# Patient Record
Sex: Female | Born: 1975 | Hispanic: No | Marital: Single | Smoking: Former smoker
Health system: Southern US, Community
[De-identification: ages and names within clinical notes are randomized; demographics above are authoritative.]

## PROBLEM LIST (undated history)

## (undated) DIAGNOSIS — E8809 Other disorders of plasma-protein metabolism, not elsewhere classified: Secondary | ICD-10-CM

## (undated) DIAGNOSIS — C7951 Secondary malignant neoplasm of bone: Secondary | ICD-10-CM

## (undated) DIAGNOSIS — F419 Anxiety disorder, unspecified: Secondary | ICD-10-CM

## (undated) DIAGNOSIS — Z87442 Personal history of urinary calculi: Secondary | ICD-10-CM

## (undated) DIAGNOSIS — D649 Anemia, unspecified: Secondary | ICD-10-CM

## (undated) DIAGNOSIS — Z8489 Family history of other specified conditions: Secondary | ICD-10-CM

## (undated) DIAGNOSIS — T8859XA Other complications of anesthesia, initial encounter: Secondary | ICD-10-CM

## (undated) DIAGNOSIS — Z8719 Personal history of other diseases of the digestive system: Secondary | ICD-10-CM

## (undated) HISTORY — PX: WISDOM TOOTH EXTRACTION: SHX21

---

## 2019-09-22 DIAGNOSIS — C801 Malignant (primary) neoplasm, unspecified: Secondary | ICD-10-CM

## 2019-09-22 DIAGNOSIS — C50919 Malignant neoplasm of unspecified site of unspecified female breast: Secondary | ICD-10-CM

## 2019-09-22 HISTORY — DX: Malignant (primary) neoplasm, unspecified: C80.1

## 2019-09-22 HISTORY — DX: Malignant neoplasm of unspecified site of unspecified female breast: C50.919

## 2020-03-05 ENCOUNTER — Other Ambulatory Visit: Payer: Self-pay | Admitting: Family Medicine

## 2020-03-05 DIAGNOSIS — N6320 Unspecified lump in the left breast, unspecified quadrant: Secondary | ICD-10-CM

## 2020-03-08 ENCOUNTER — Other Ambulatory Visit: Payer: Self-pay

## 2020-03-08 ENCOUNTER — Ambulatory Visit
Admission: RE | Admit: 2020-03-08 | Discharge: 2020-03-08 | Disposition: A | Discharge status: Home / Self Care | Payer: Self-pay | Source: Ambulatory Visit | Attending: Family Medicine | Admitting: Family Medicine

## 2020-03-08 ENCOUNTER — Other Ambulatory Visit: Payer: Self-pay | Admitting: Family Medicine

## 2020-03-08 ENCOUNTER — Ambulatory Visit
Admission: RE | Admit: 2020-03-08 | Discharge: 2020-03-08 | Disposition: A | Discharge status: Home / Self Care | Payer: Managed Care, Other (non HMO) | Source: Ambulatory Visit | Attending: Family Medicine | Admitting: Family Medicine

## 2020-03-08 DIAGNOSIS — N6320 Unspecified lump in the left breast, unspecified quadrant: Secondary | ICD-10-CM

## 2020-03-08 DIAGNOSIS — N6489 Other specified disorders of breast: Secondary | ICD-10-CM

## 2020-03-08 IMAGING — MG DIGITAL DIAGNOSTIC BILAT W/ TOMO W/ CAD
8 of 15 series · 8 of 40 positions shown · non-contrast
Comparison: Previous exam(s).

CLINICAL DATA: Patient presents for evaluation of palpable
abnormality within the left breast.

EXAM:
DIGITAL DIAGNOSTIC BILATERAL MAMMOGRAM WITH CAD AND TOMO
ULTRASOUND BILATERAL BREAST

[R CC synth-2D (1 of 2)]
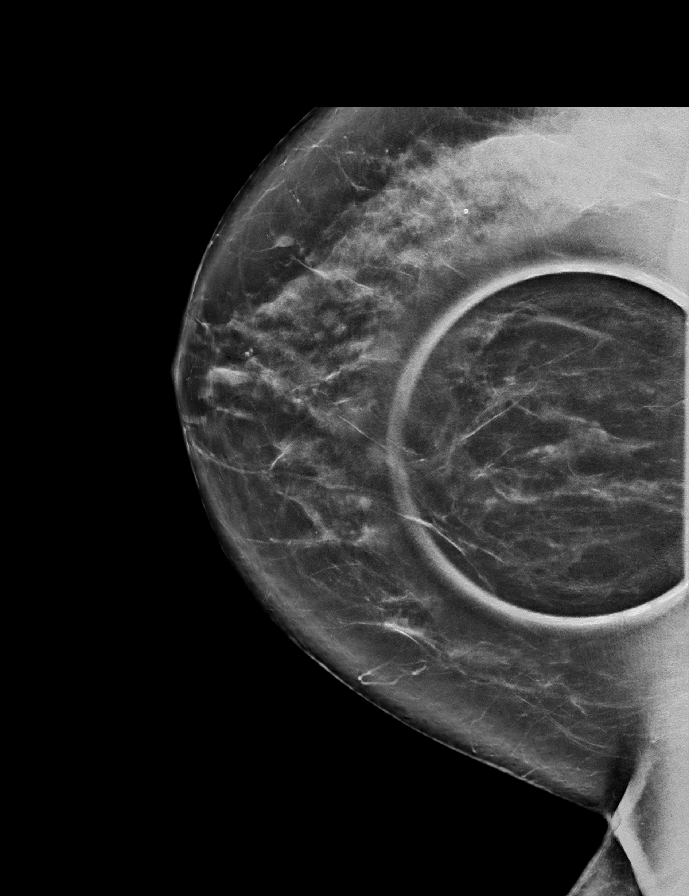

[R ML synth-2D]
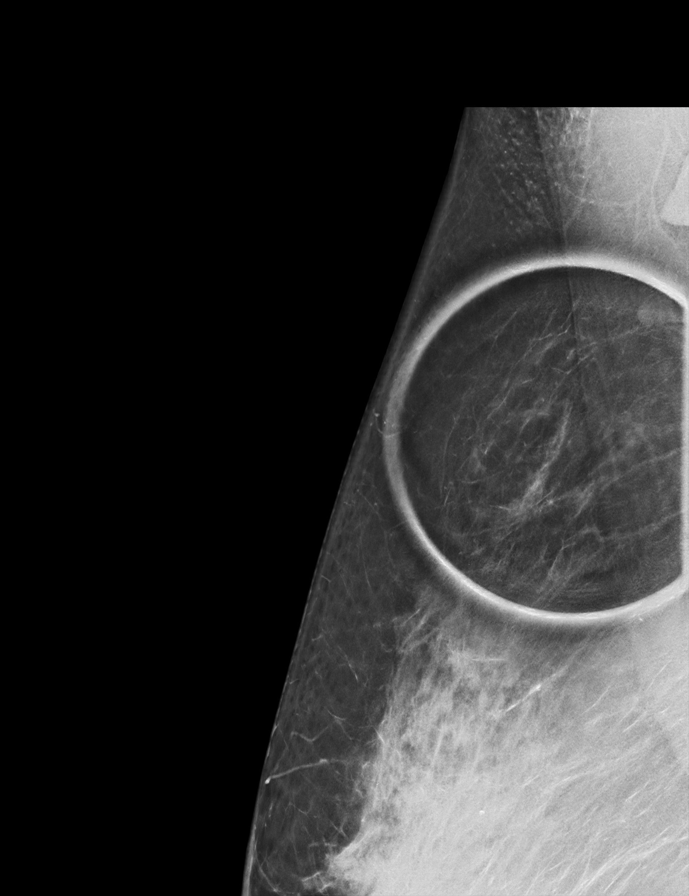

[R MLO synth-2D]
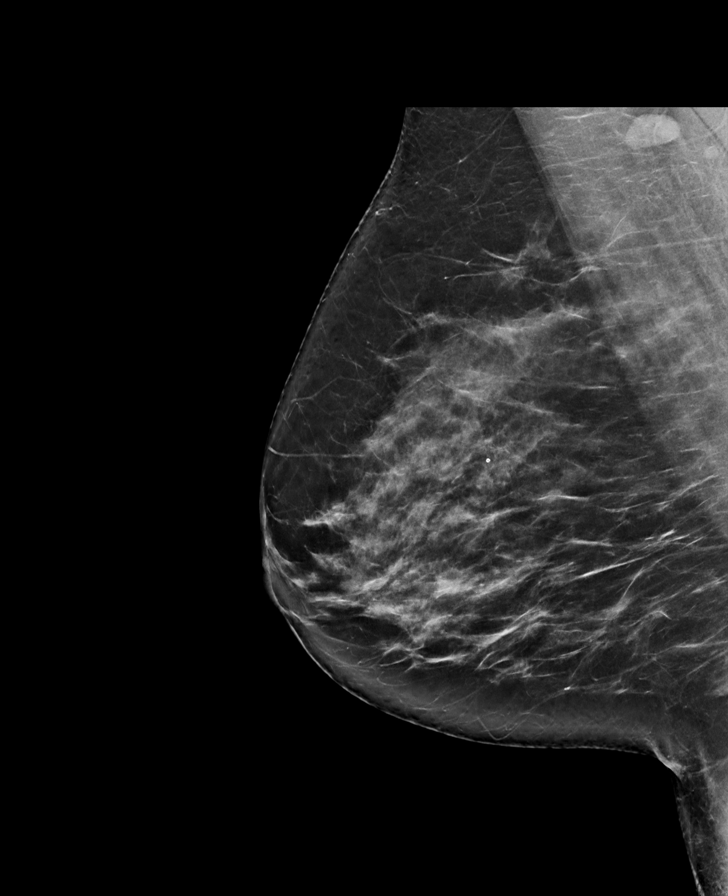

[R CC synth-2D (2 of 2)]
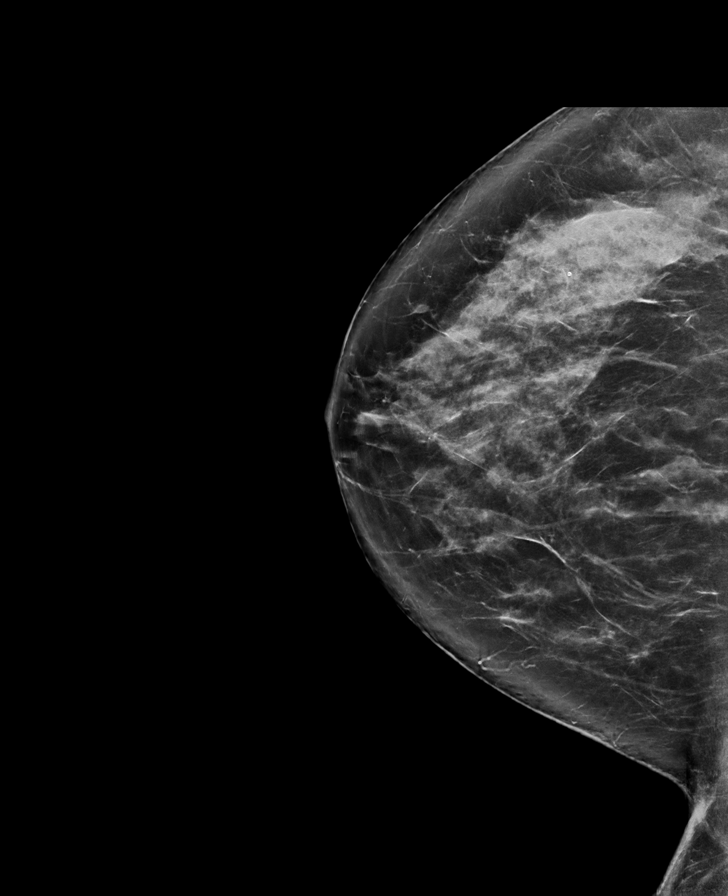

[L TAN synth-2D]
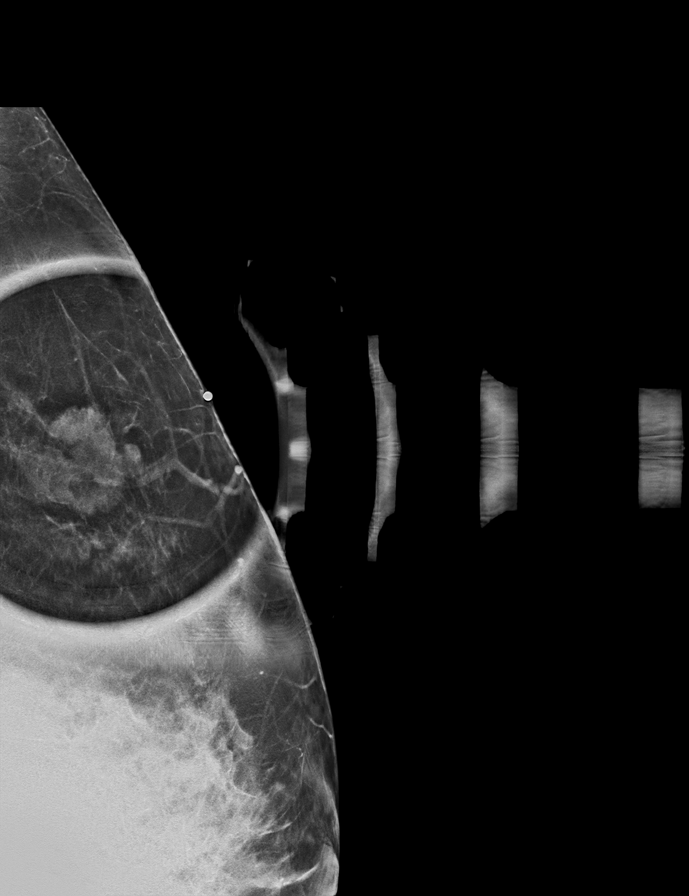

[L CC synth-2D]
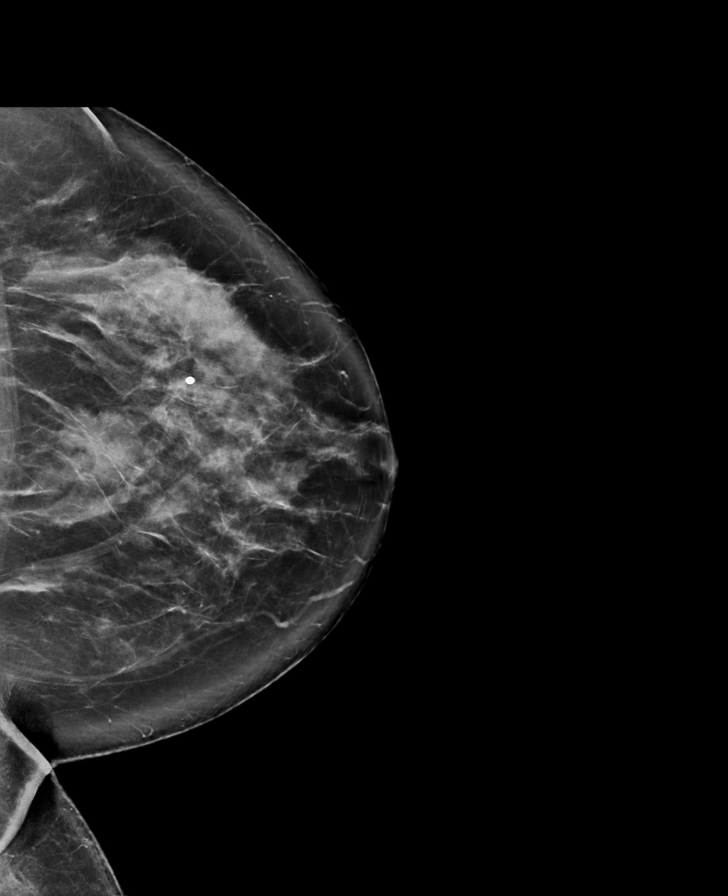

[L MLO synth-2D]
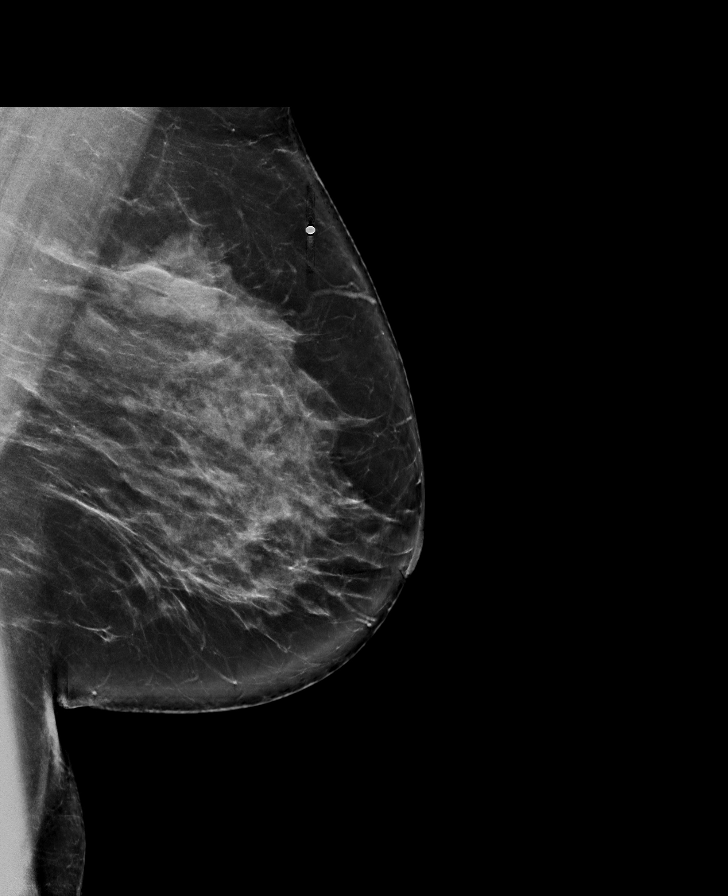

[R CC tomo · tomo slice 59/86.0]
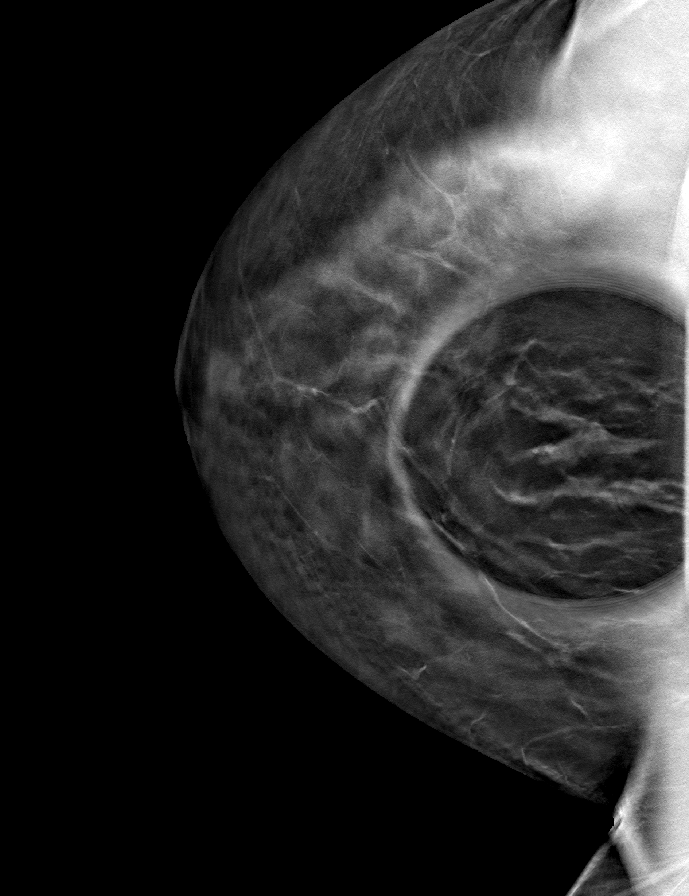

[8 of 40 positions shown; findings below may reference images not displayed]

ACR Breast Density Category c: The breast tissue is heterogeneously
dense, which may obscure small masses.
FINDINGS: Underlying the palpable marker within the left breast is a lobular
mass.

Within the upper inner right breast posterior depth there is dense
fibroglandular tissue, further evaluated with spot compression cc
tomosynthesis images.

No additional suspicious findings within the right or left breast.

Mammographic images were processed with CAD.

On physical exam, there is a palpable mass within the superior left
breast.

Targeted ultrasound is performed, showing a 3.0 x 1.7 x 2.6 cm
irregular hypoechoic left breast mass 11 o'clock position 10 cm from
nipple the site of palpable concern.

No left axillary adenopathy.

No suspicious abnormality within the upper inner or upper-outer
right breast.

Normal right axillary lymph nodes.
IMPRESSION: Suspicious palpable mass within the left breast 11 o'clock position.

RECOMMENDATION:
Ultrasound-guided core needle biopsy palpable suspicious left breast
mass 11 o'clock position.

I have discussed the findings and recommendations with the patient.
If applicable, a reminder letter will be sent to the patient
regarding the next appointment.

BI-RADS CATEGORY  5: Highly suggestive of malignancy.

## 2020-03-08 IMAGING — US US BREAST*L* LIMITED INC AXILLA
1 series · 13 of 13 positions shown · non-contrast
Comparison: Previous exam(s).

CLINICAL DATA: Patient presents for evaluation of palpable
abnormality within the left breast.

EXAM:
DIGITAL DIAGNOSTIC BILATERAL MAMMOGRAM WITH CAD AND TOMO
ULTRASOUND BILATERAL BREAST

[Series 1: us breast*left* limited inc axilla · 0.07mm/px · 13 of 13 slices shown]
[im 1/13]
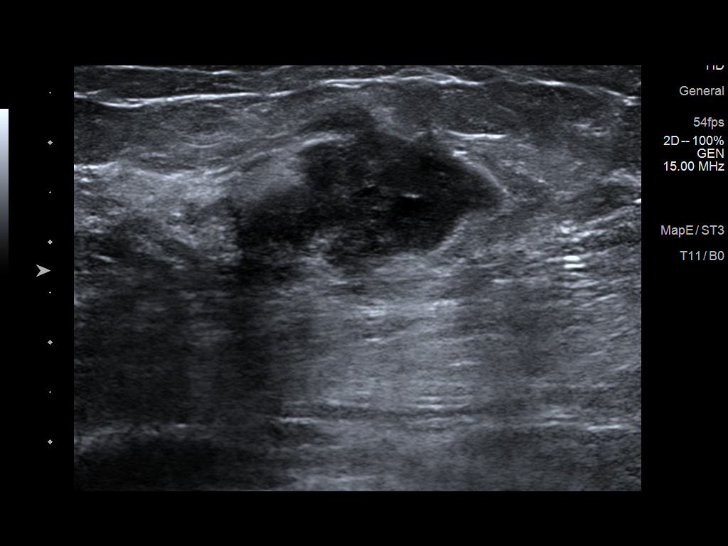
[im 2/13]
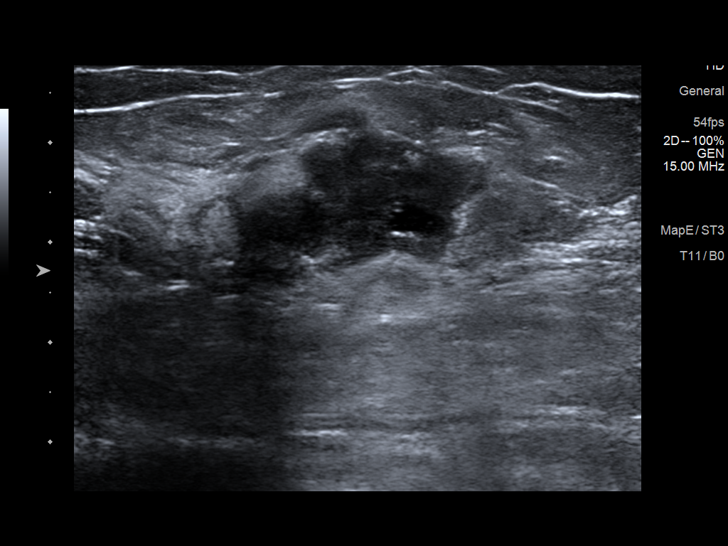
[im 3/13]
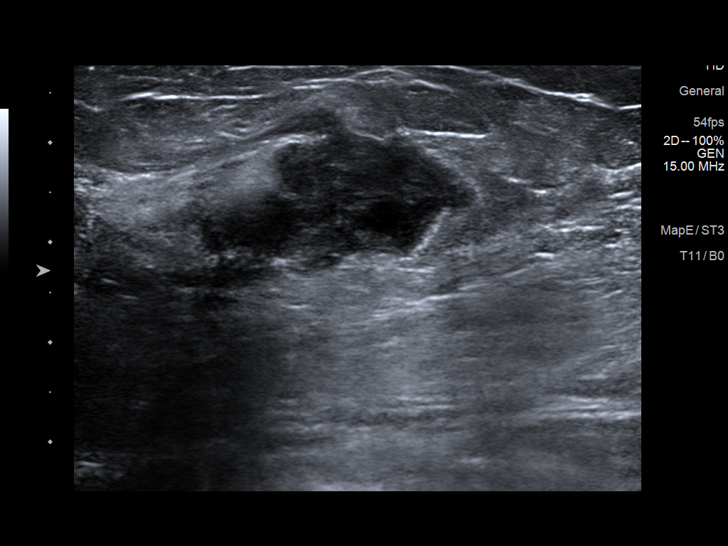
[im 4/13]
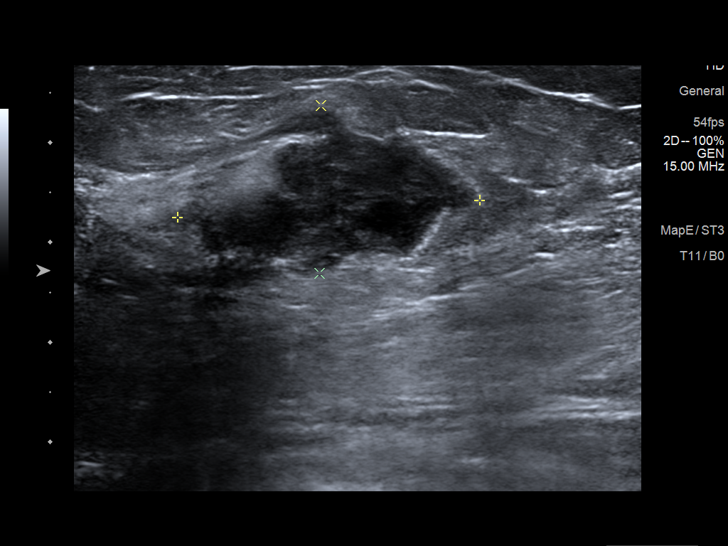
[im 5/13]
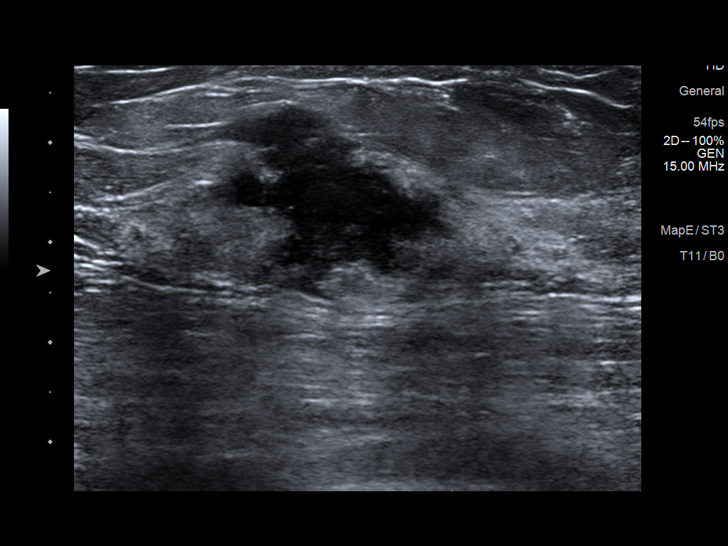
[im 6/13]
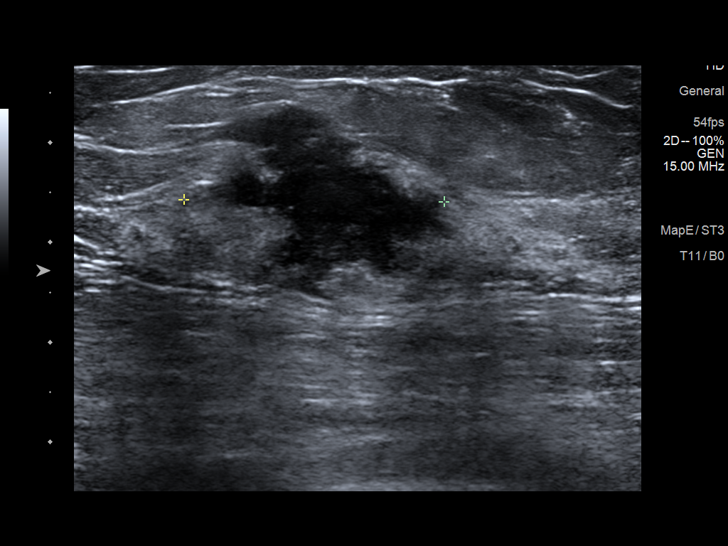
[im 7/13]
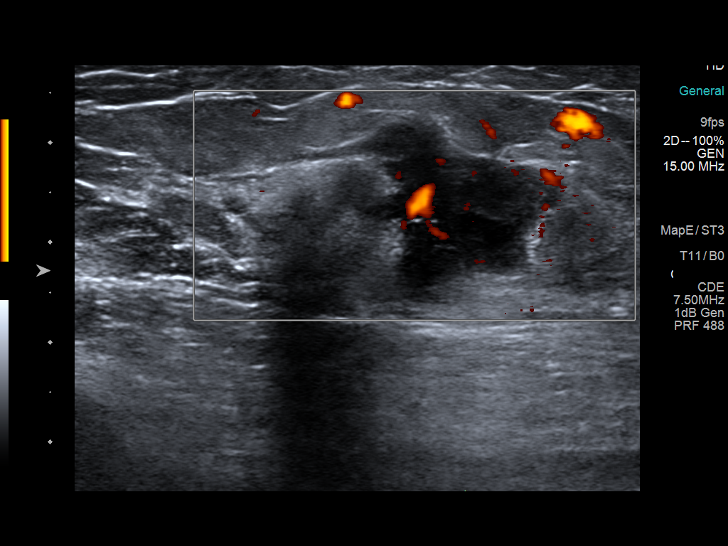
[im 8/13]
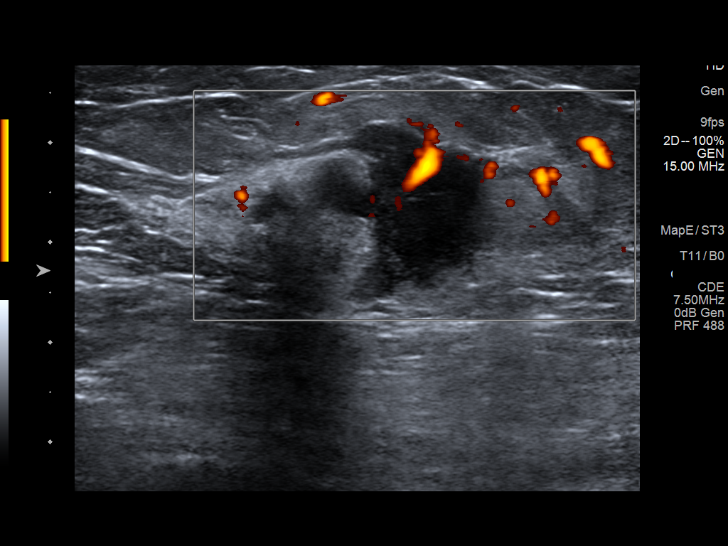
[im 9/13]
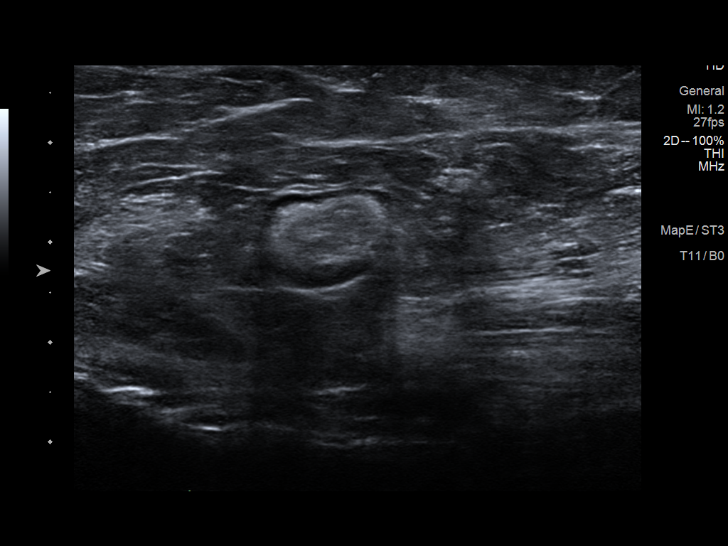
[im 10/13]
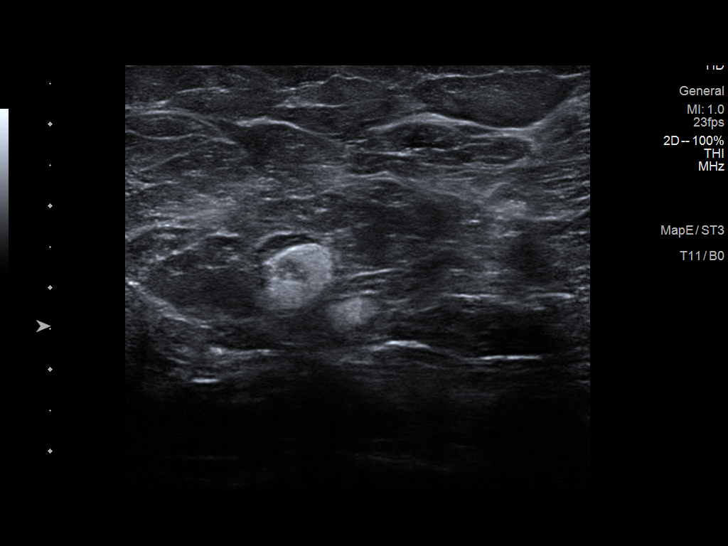
[im 11/13]
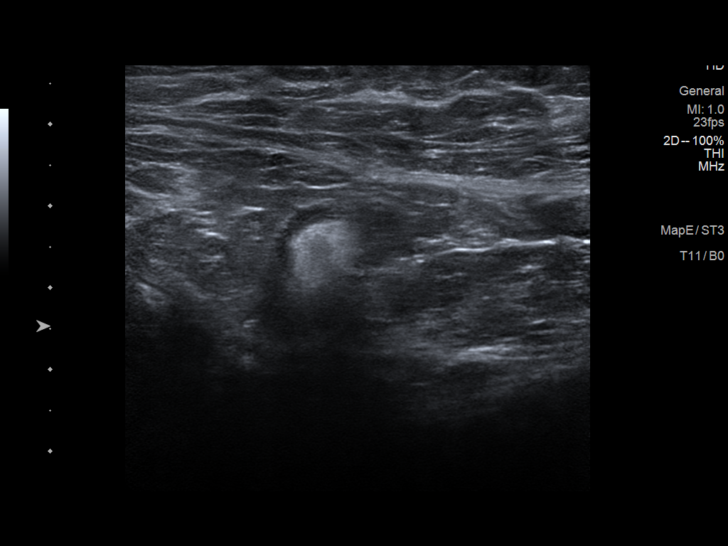
[im 12/13]
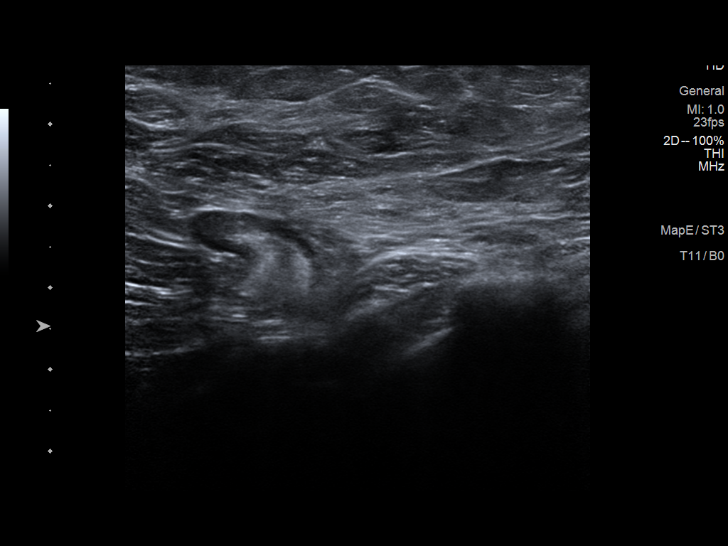
[im 13/13]
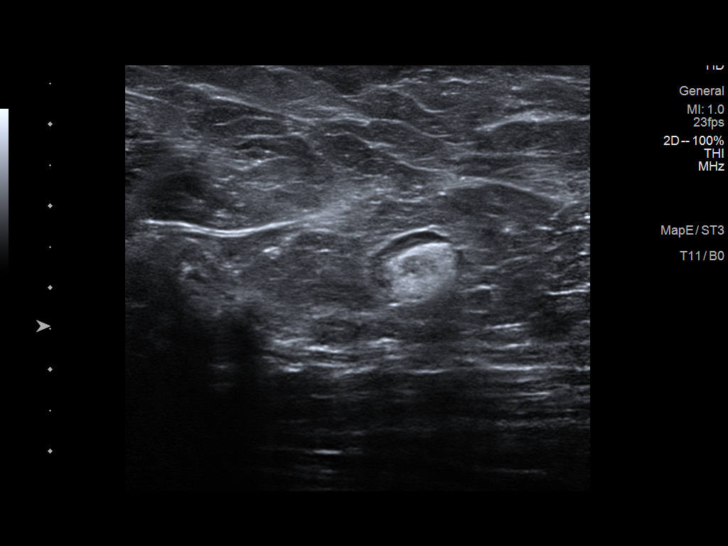

[13 of 13 positions shown; findings below may reference images not displayed]

ACR Breast Density Category c: The breast tissue is heterogeneously
dense, which may obscure small masses.
FINDINGS: Underlying the palpable marker within the left breast is a lobular
mass.

Within the upper inner right breast posterior depth there is dense
fibroglandular tissue, further evaluated with spot compression cc
tomosynthesis images.

No additional suspicious findings within the right or left breast.

Mammographic images were processed with CAD.

On physical exam, there is a palpable mass within the superior left
breast.

Targeted ultrasound is performed, showing a 3.0 x 1.7 x 2.6 cm
irregular hypoechoic left breast mass 11 o'clock position 10 cm from
nipple the site of palpable concern.

No left axillary adenopathy.

No suspicious abnormality within the upper inner or upper-outer
right breast.

Normal right axillary lymph nodes.
IMPRESSION: Suspicious palpable mass within the left breast 11 o'clock position.

RECOMMENDATION:
Ultrasound-guided core needle biopsy palpable suspicious left breast
mass 11 o'clock position.

I have discussed the findings and recommendations with the patient.
If applicable, a reminder letter will be sent to the patient
regarding the next appointment.

BI-RADS CATEGORY  5: Highly suggestive of malignancy.

## 2020-03-08 IMAGING — US US BREAST*R* LIMITED INC AXILLA
1 series · 13 of 15 positions shown · non-contrast
Comparison: Previous exam(s).

CLINICAL DATA: Patient presents for evaluation of palpable
abnormality within the left breast.

EXAM:
DIGITAL DIAGNOSTIC BILATERAL MAMMOGRAM WITH CAD AND TOMO
ULTRASOUND BILATERAL BREAST

[Series 1: us breast*right* limited inc axilla · 0.07mm/px · 13 of 15 slices shown]
[im 1/15]
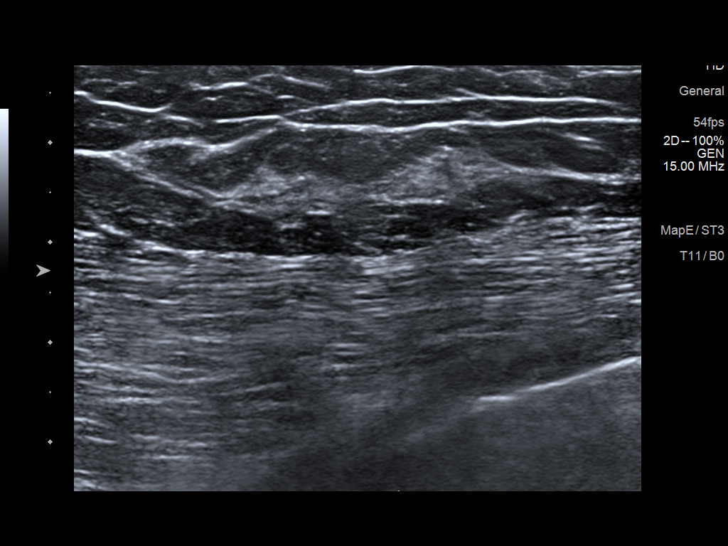
[im 2/15]
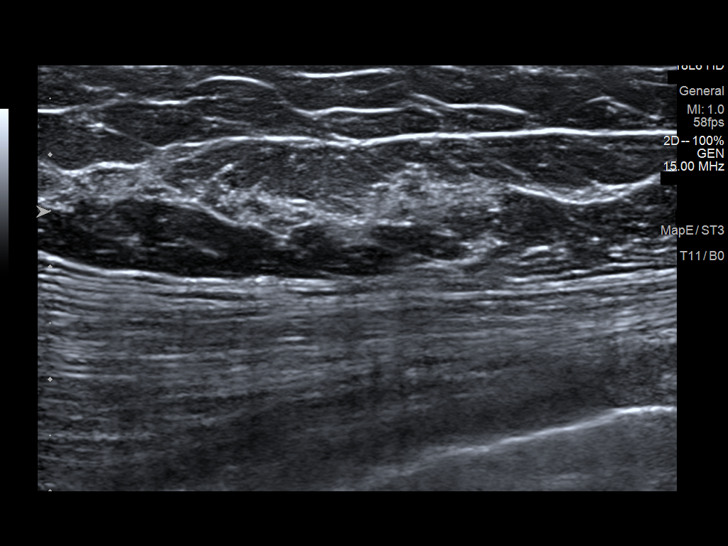
[im 3/15]
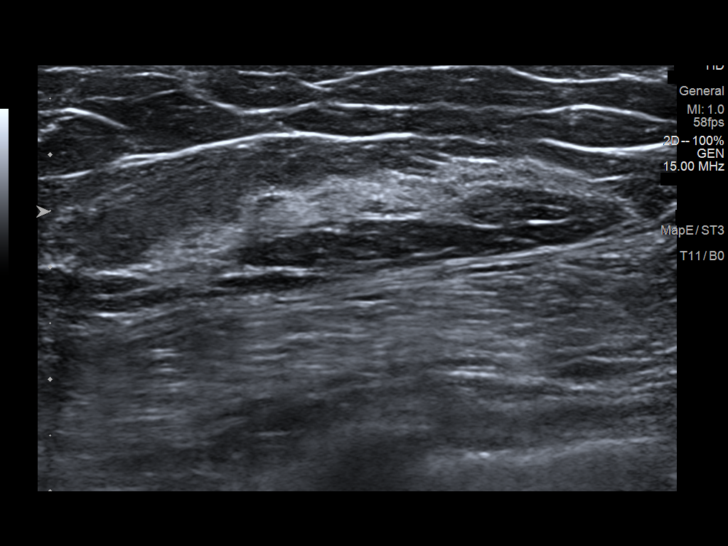
[im 5/15]
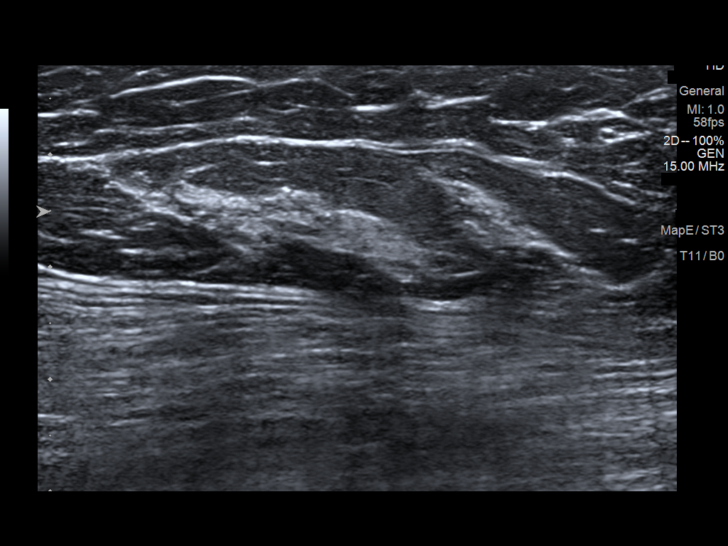
[im 6/15]
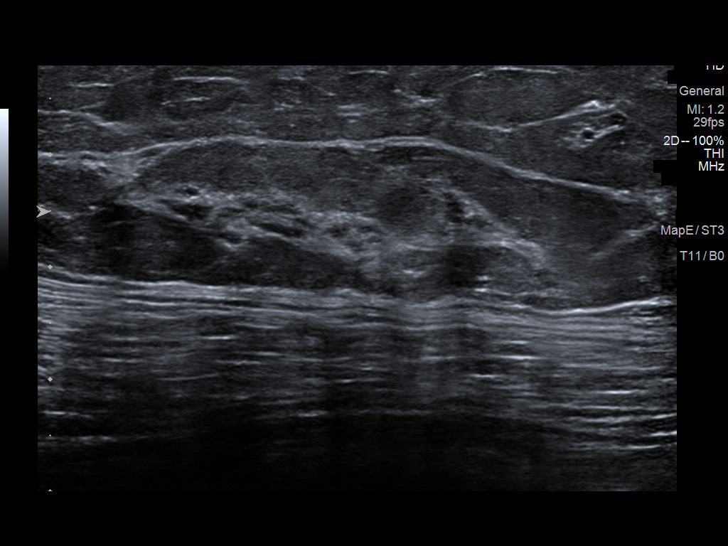
[im 7/15]
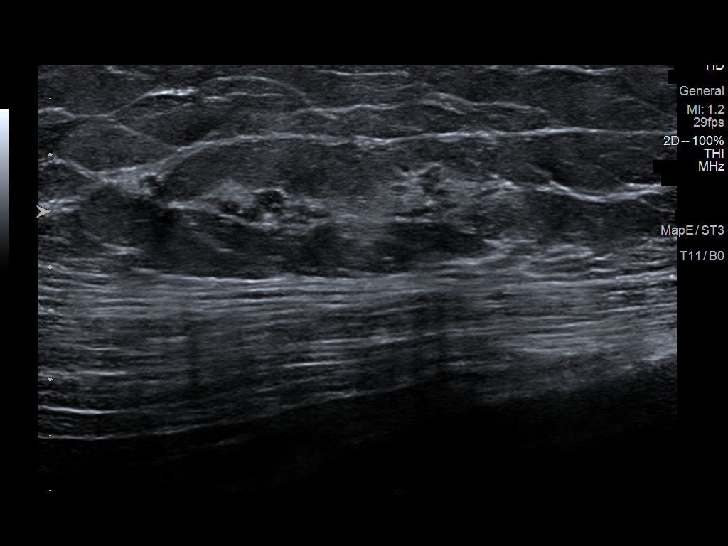
[im 8/15]
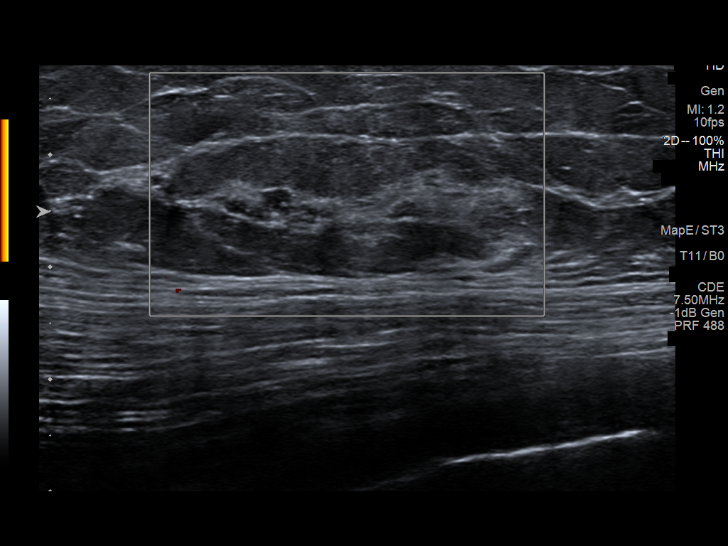
[im 9/15]
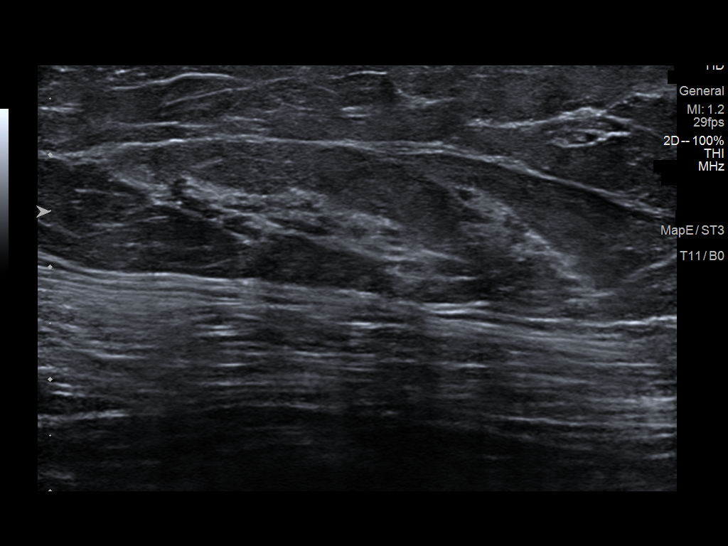
[im 10/15]
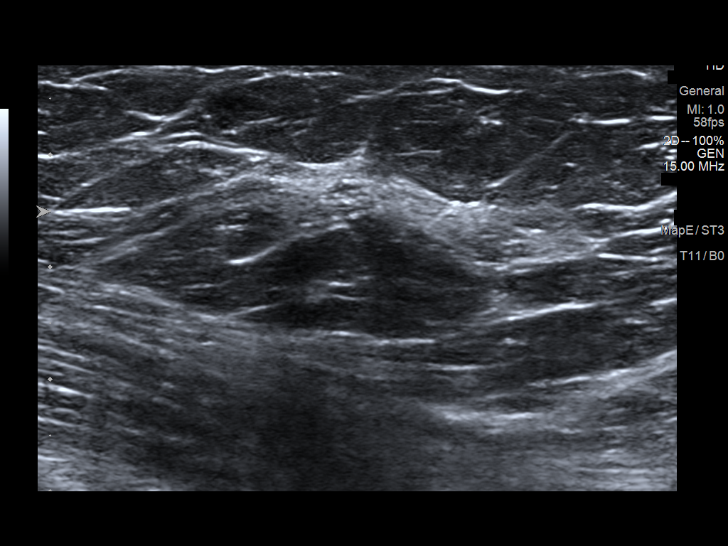
[im 11/15]
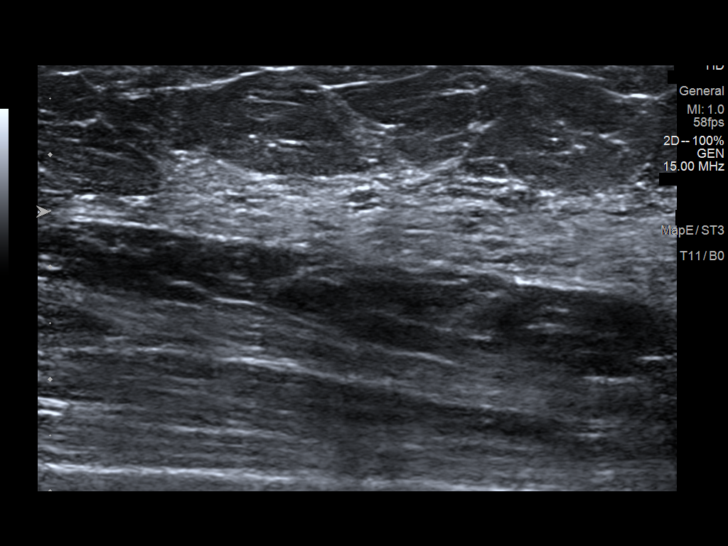
[im 13/15]
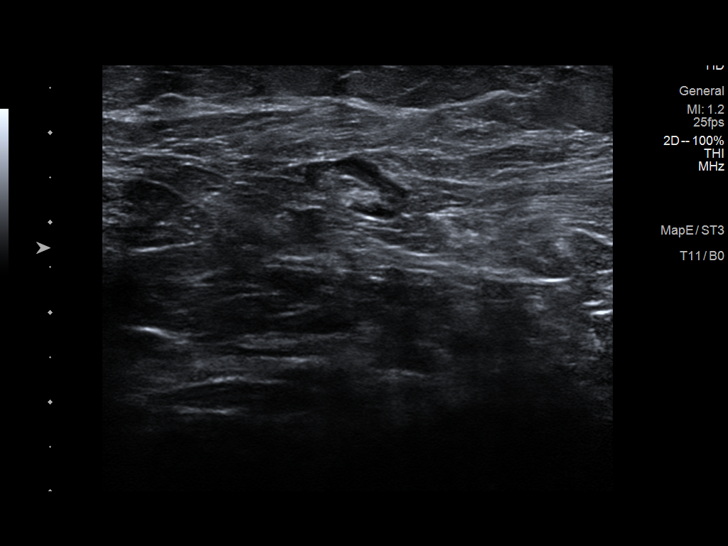
[im 14/15]
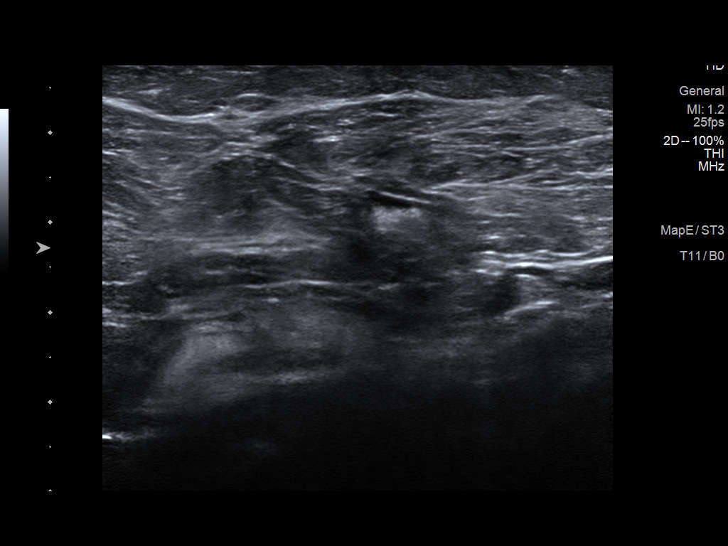
[im 15/15]
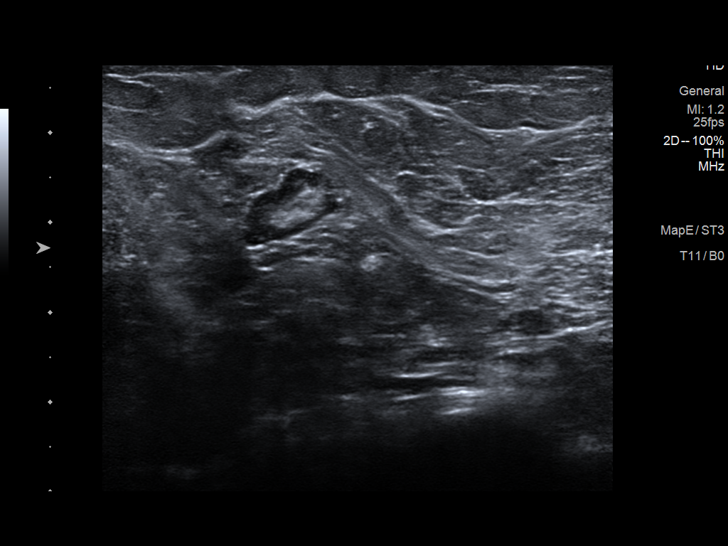

[13 of 15 positions shown; findings below may reference images not displayed]

ACR Breast Density Category c: The breast tissue is heterogeneously
dense, which may obscure small masses.
FINDINGS: Underlying the palpable marker within the left breast is a lobular
mass.

Within the upper inner right breast posterior depth there is dense
fibroglandular tissue, further evaluated with spot compression cc
tomosynthesis images.

No additional suspicious findings within the right or left breast.

Mammographic images were processed with CAD.

On physical exam, there is a palpable mass within the superior left
breast.

Targeted ultrasound is performed, showing a 3.0 x 1.7 x 2.6 cm
irregular hypoechoic left breast mass 11 o'clock position 10 cm from
nipple the site of palpable concern.

No left axillary adenopathy.

No suspicious abnormality within the upper inner or upper-outer
right breast.

Normal right axillary lymph nodes.
IMPRESSION: Suspicious palpable mass within the left breast 11 o'clock position.

RECOMMENDATION:
Ultrasound-guided core needle biopsy palpable suspicious left breast
mass 11 o'clock position.

I have discussed the findings and recommendations with the patient.
If applicable, a reminder letter will be sent to the patient
regarding the next appointment.

BI-RADS CATEGORY  5: Highly suggestive of malignancy.

## 2020-03-18 ENCOUNTER — Other Ambulatory Visit: Payer: Managed Care, Other (non HMO)

## 2020-04-09 ENCOUNTER — Other Ambulatory Visit: Payer: Managed Care, Other (non HMO)

## 2020-04-11 ENCOUNTER — Ambulatory Visit
Admission: RE | Admit: 2020-04-11 | Discharge: 2020-04-11 | Disposition: A | Discharge status: Home / Self Care | Payer: Managed Care, Other (non HMO) | Source: Ambulatory Visit | Attending: Family Medicine | Admitting: Family Medicine

## 2020-04-11 ENCOUNTER — Other Ambulatory Visit: Payer: Self-pay

## 2020-04-11 ENCOUNTER — Other Ambulatory Visit: Payer: Managed Care, Other (non HMO)

## 2020-04-11 DIAGNOSIS — N6320 Unspecified lump in the left breast, unspecified quadrant: Secondary | ICD-10-CM

## 2020-04-11 DIAGNOSIS — D0510 Intraductal carcinoma in situ of unspecified breast: Secondary | ICD-10-CM | POA: Insufficient documentation

## 2020-04-11 IMAGING — US US BREAST BX W LOC DEV 1ST LESION IMG BX SPEC US GUIDE*L*
1 series · 11 of 11 positions shown · non-contrast
Comparison: Previous exam(s).
COMPARISON: Previous exam(s).

Addendum:
CLINICAL DATA: Patient presents for ultrasound-guided core biopsy
of LEFT breast mass.

EXAM:
ULTRASOUND GUIDED LEFT BREAST CORE NEEDLE BIOPSY

[Series 1: us breast bx w loc dev 1st lesion img bx spec us g · 0.06mm/px · 11 of 11 slices shown]
[im 1/11]
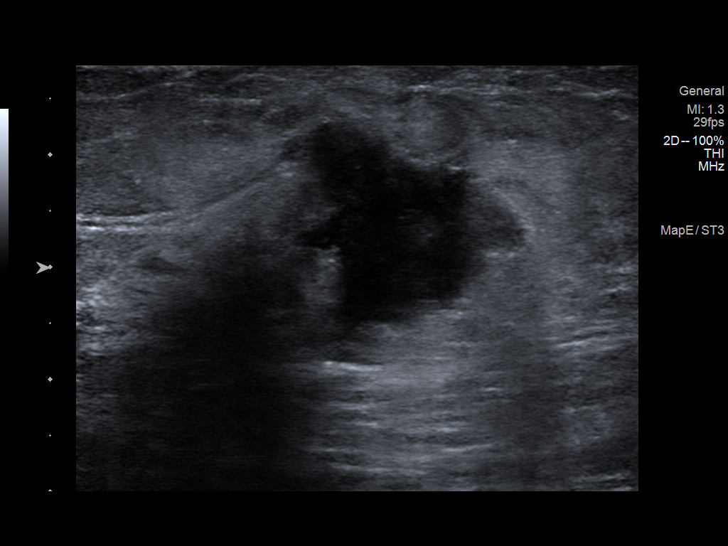
[im 2/11]
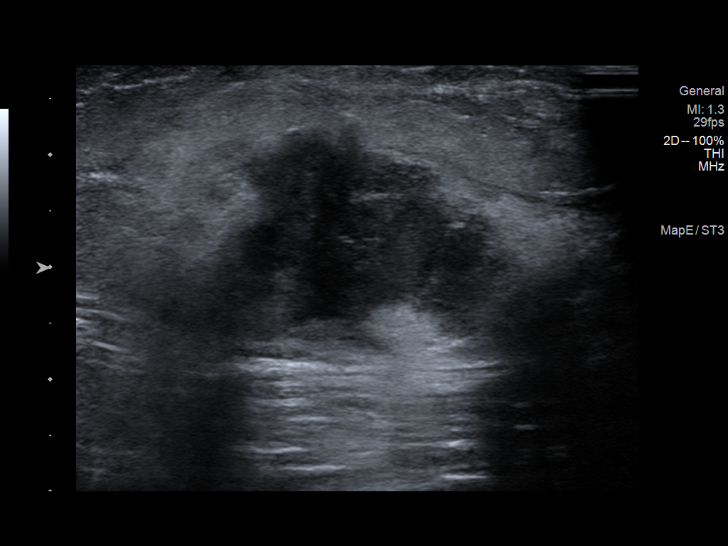
[im 3/11]
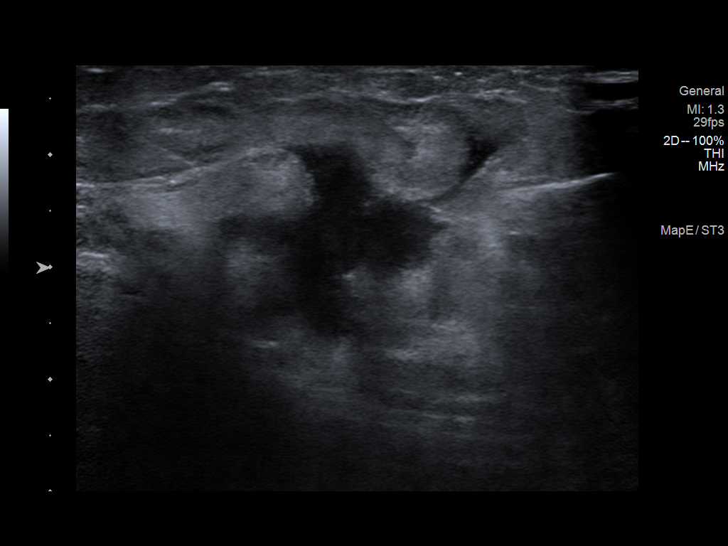
[im 4/11]
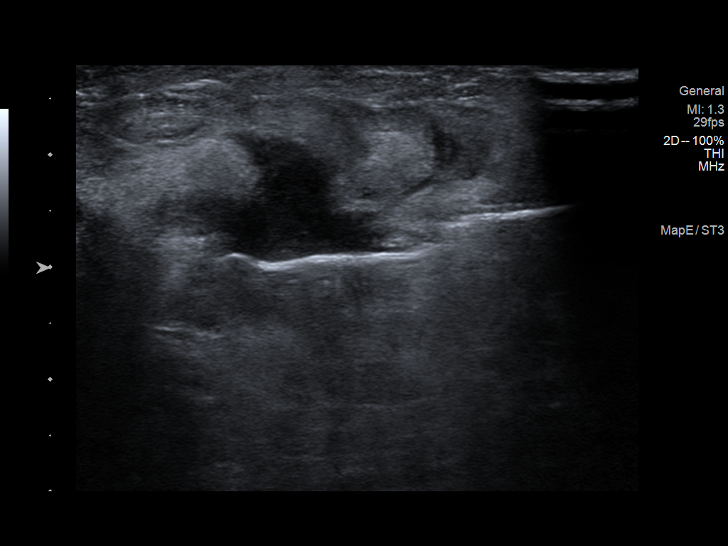
[im 5/11]
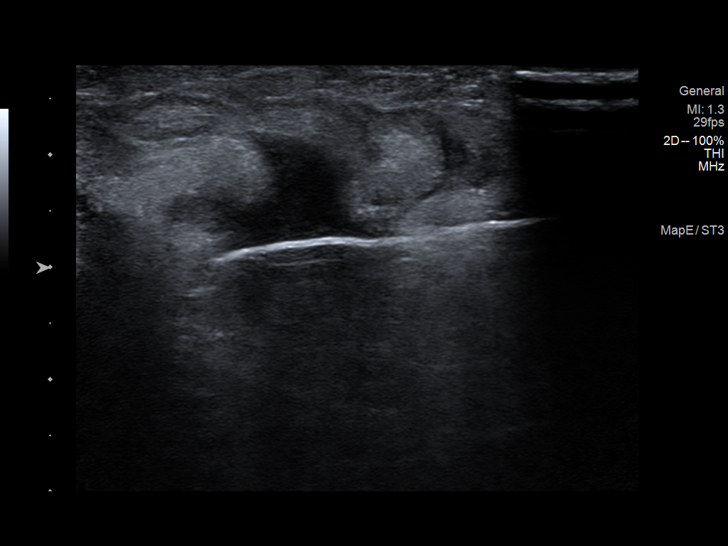
[im 6/11]
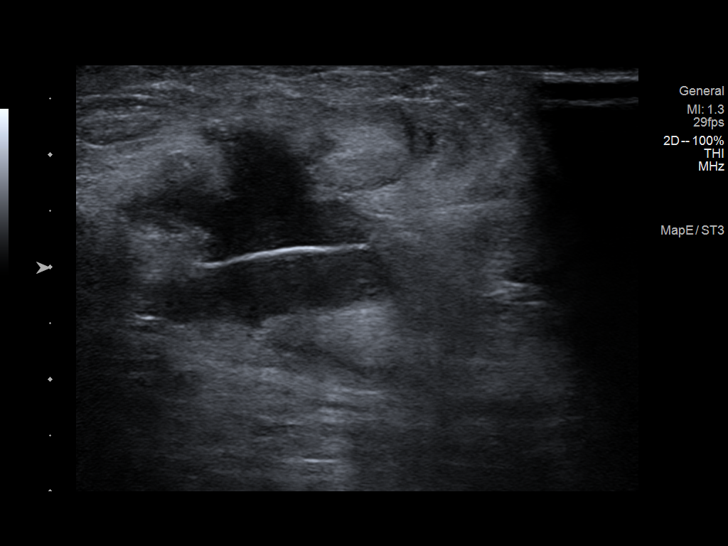
[im 7/11]
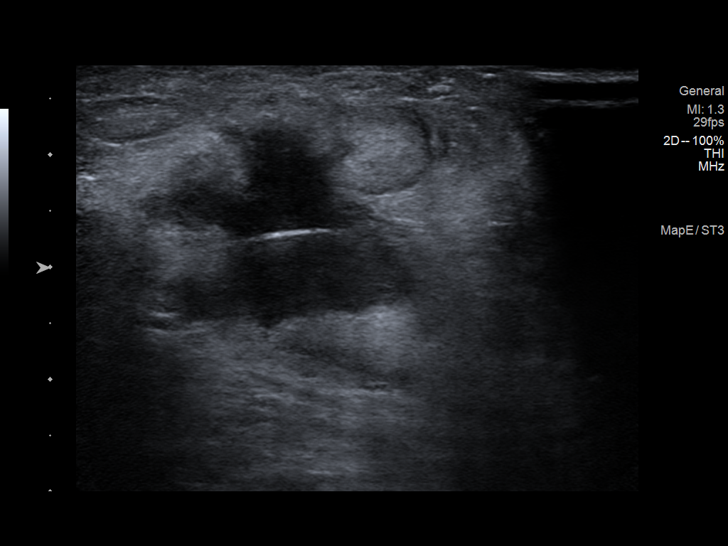
[im 8/11]
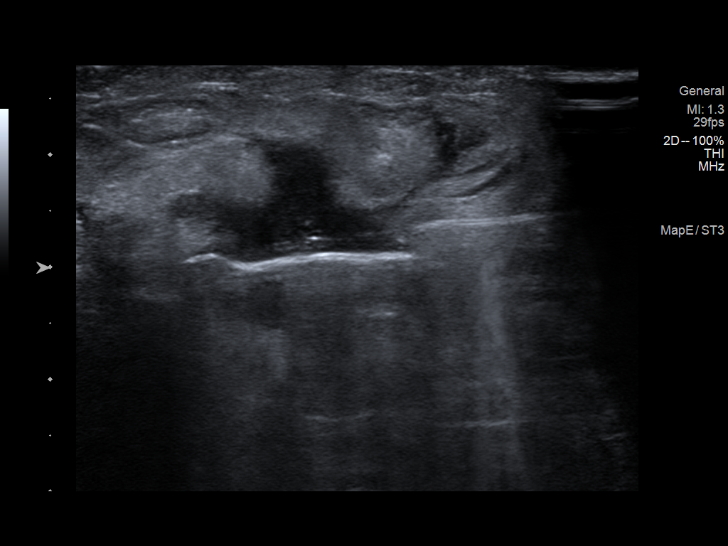
[im 9/11]
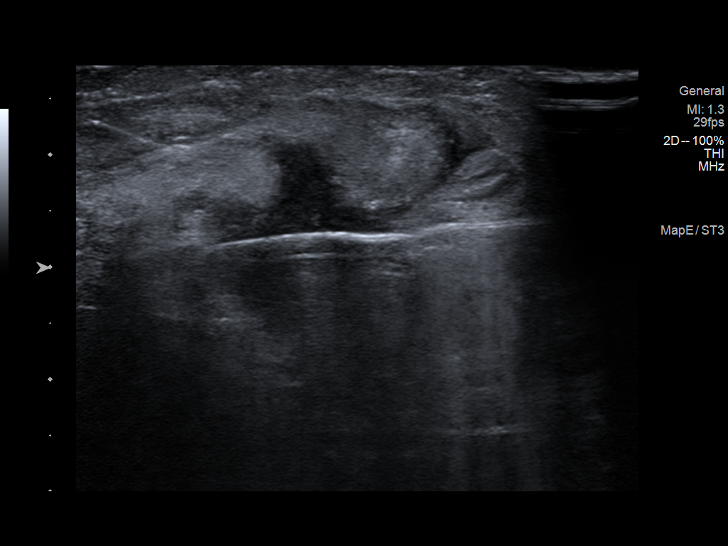
[im 10/11]
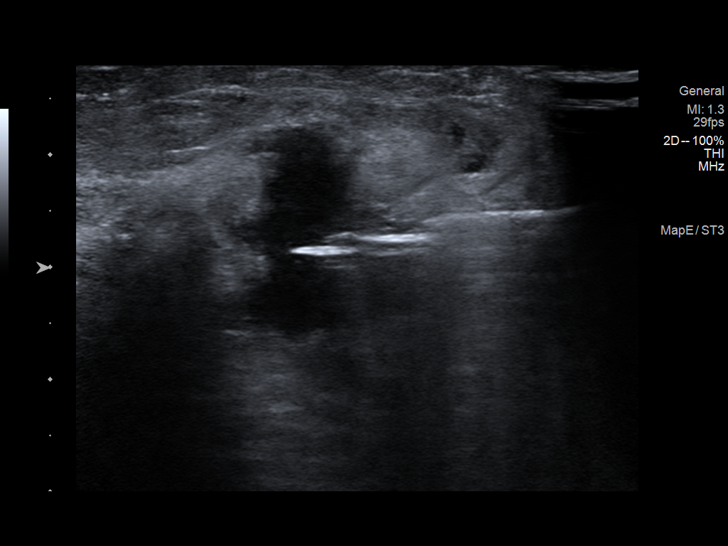
[im 11/11]
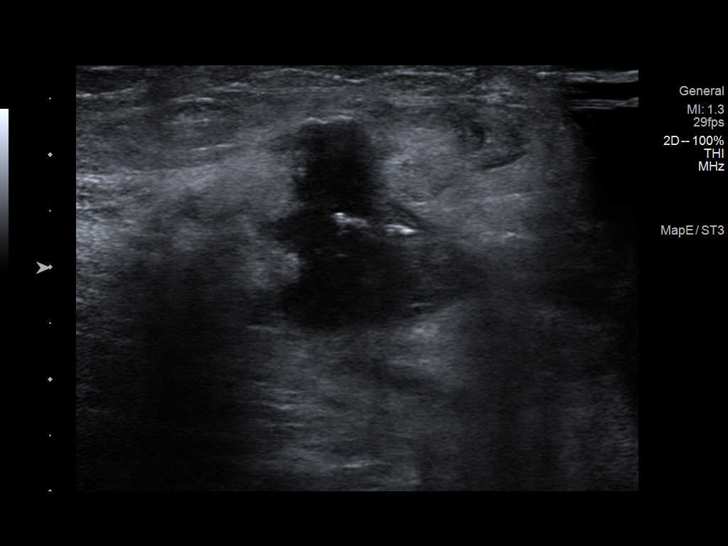

[11 of 11 positions shown; findings below may reference images not displayed]



Lesion quadrant: UPPER INNER QUADRANT LEFT breast

Using sterile technique and 1% Lidocaine as local anesthetic, under
direct ultrasound visualization, a 12 gauge NIVIRUS device was
used to perform biopsy of mass in the 11 o'clock location of the
LEFT breast using a LATERAL knee approach. At the conclusion of the
procedure ribbon shaped tissue marker clip was deployed into the
biopsy cavity. Follow up 2 view mammogram was performed and dictated
separately.
IMPRESSION: Ultrasound guided biopsy of LEFT breast mass. No apparent
complications.

ADDENDUM:
Pathology revealed GRADE II INVASIVE DUCTAL CARCINOMA, DUCTAL
CARCINOMA IN SITU of LEFT breast, 11 o'clock, [88], (ribbon clip).
This was found to be concordant by Dr. NIVIRUS.

Pathology results were discussed with the patient by telephone. The
patient reported doing well after the biopsy with tenderness and
swelling at the site. Post biopsy instructions and care were
reviewed and questions were answered. The patient was encouraged to
call The [REDACTED] for any additional
concerns. My direct phone number was provided.

Surgical consultation has been arranged with Dr. NIVIRUS at
[REDACTED] on [DATE].

Pathology results reported by NIVIRUS, RN on [DATE].



Lesion quadrant: UPPER INNER QUADRANT LEFT breast

Using sterile technique and 1% Lidocaine as local anesthetic, under
direct ultrasound visualization, a 12 gauge NIVIRUS device was
used to perform biopsy of mass in the 11 o'clock location of the
LEFT breast using a LATERAL knee approach. At the conclusion of the
procedure ribbon shaped tissue marker clip was deployed into the
biopsy cavity. Follow up 2 view mammogram was performed and dictated
separately.
IMPRESSION: Ultrasound guided biopsy of LEFT breast mass. No apparent
complications.

## 2020-04-11 IMAGING — MG MM BREAST LOCALIZATION CLIP
4 series · 4 of 12 positions shown · non-contrast
Comparison: Previous exam(s).

CLINICAL DATA: Status post ultrasound-guided core biopsy of mass in
the 11 o'clock location of the LEFT breast.

EXAM:
DIAGNOSTIC LEFT MAMMOGRAM POST ULTRASOUND BIOPSY

[L ML synth-2D]
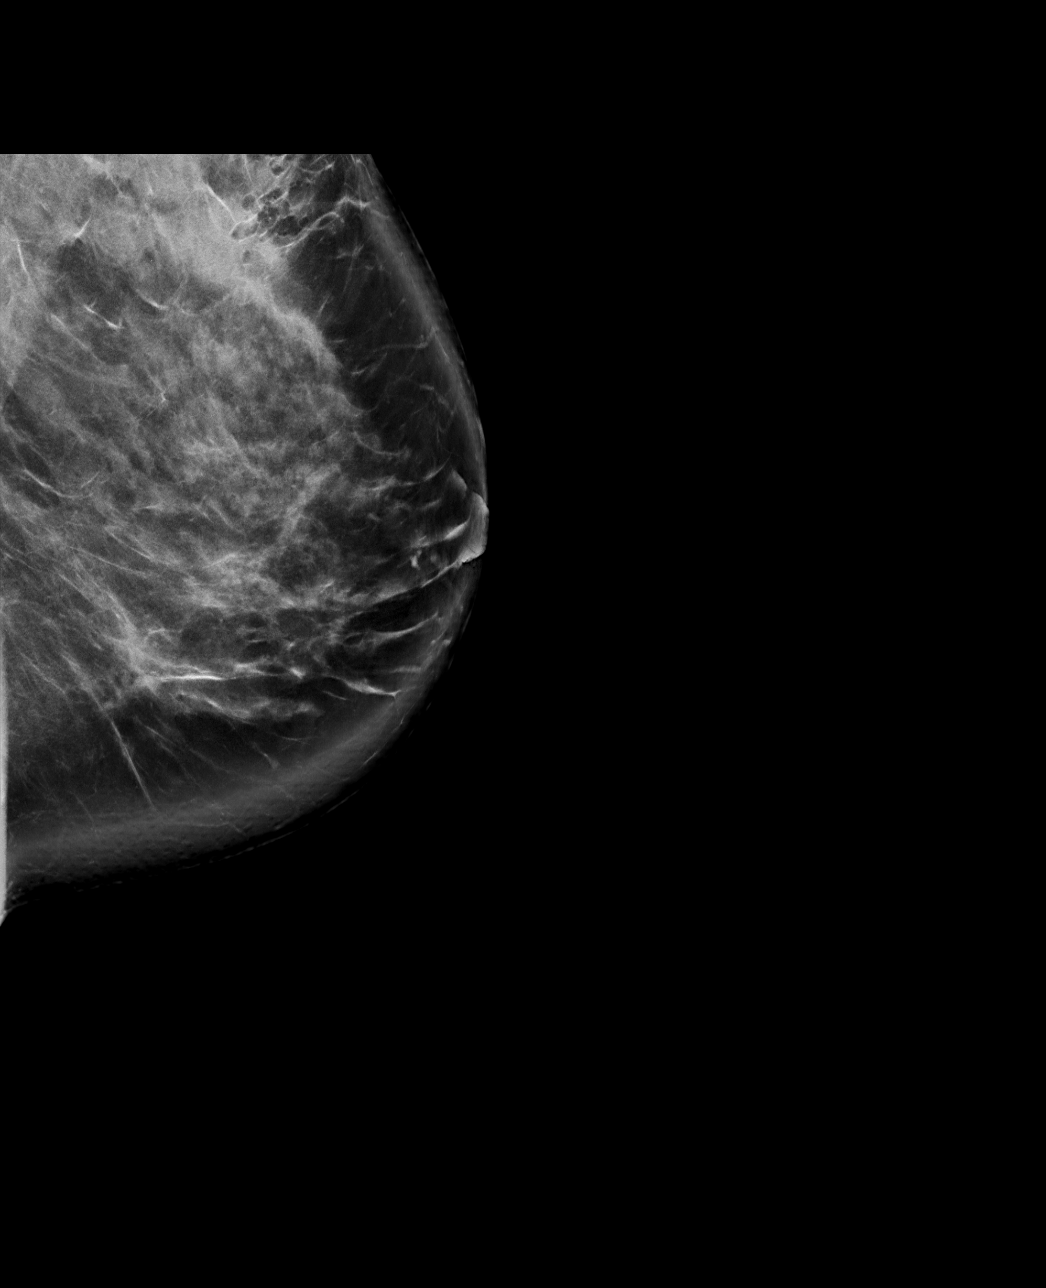

[L CC synth-2D]
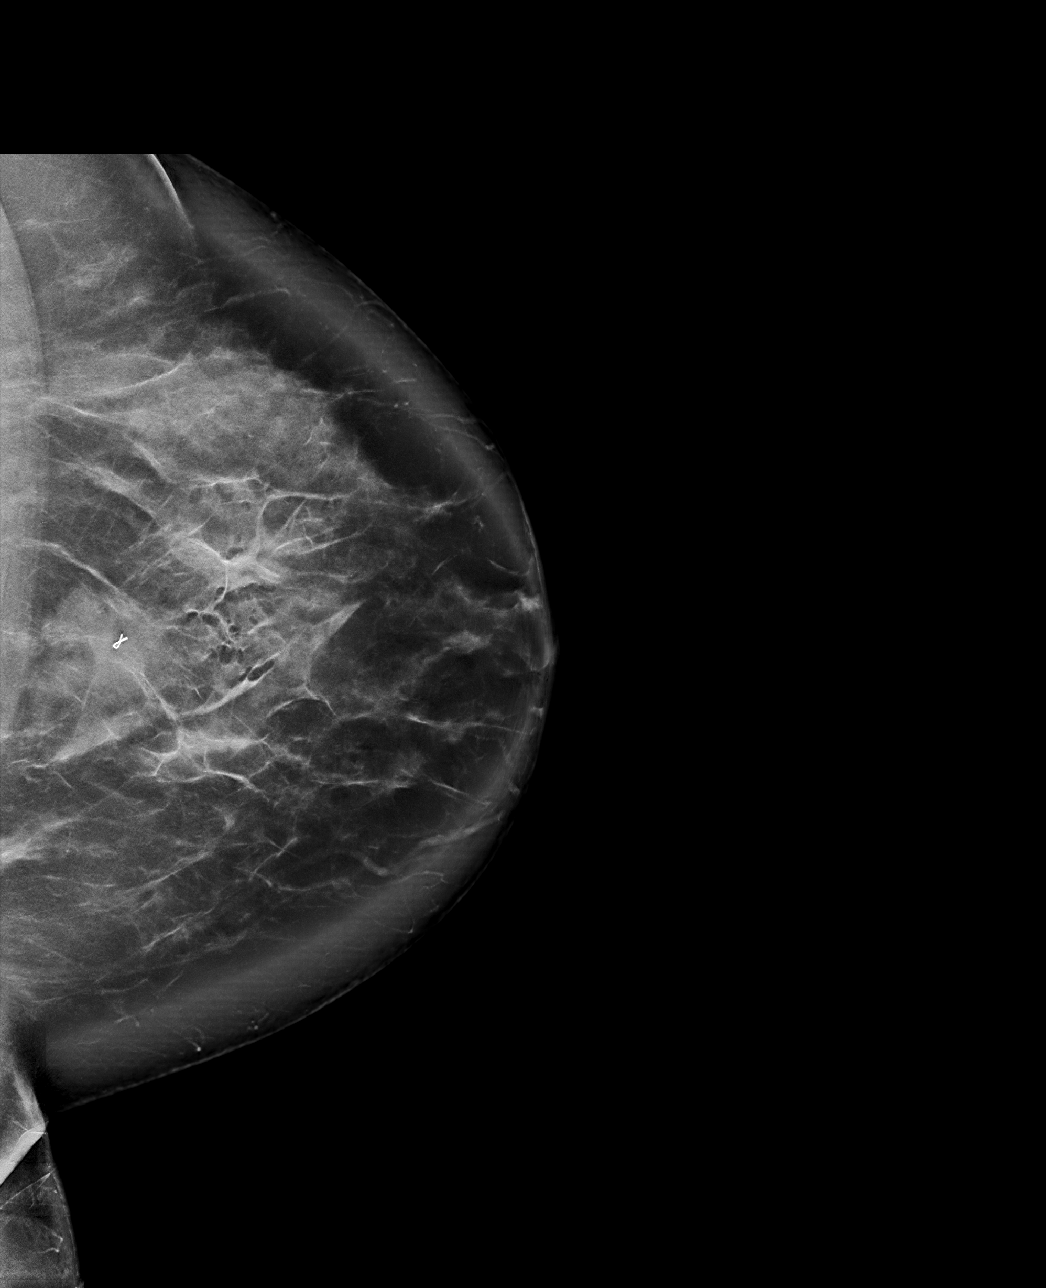

[L CC tomo · tomo slice 61/121.0]
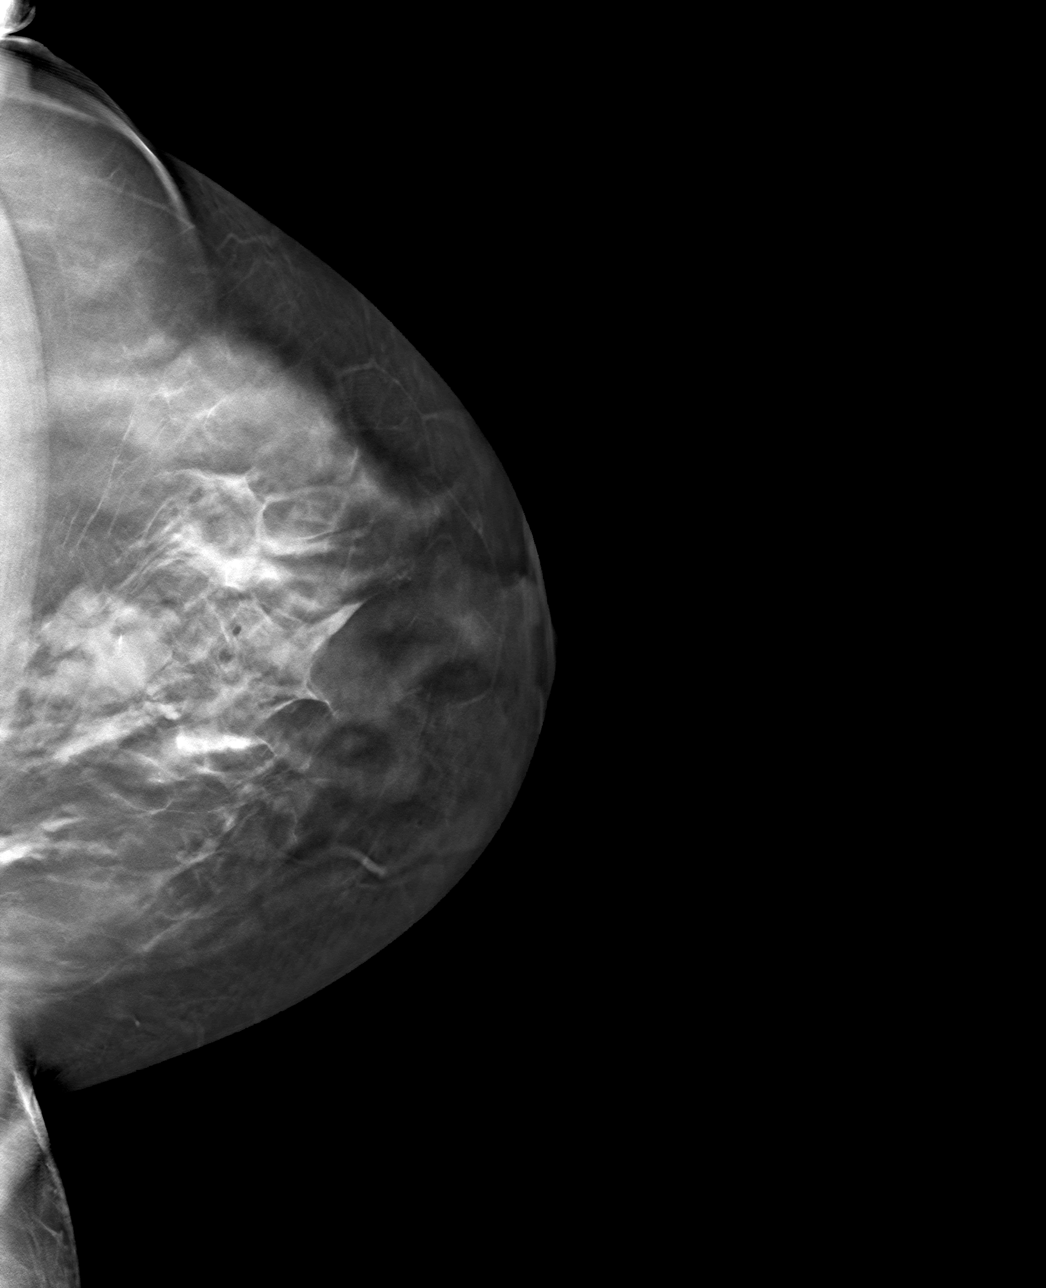

[L ML tomo · tomo slice 59/117.0]
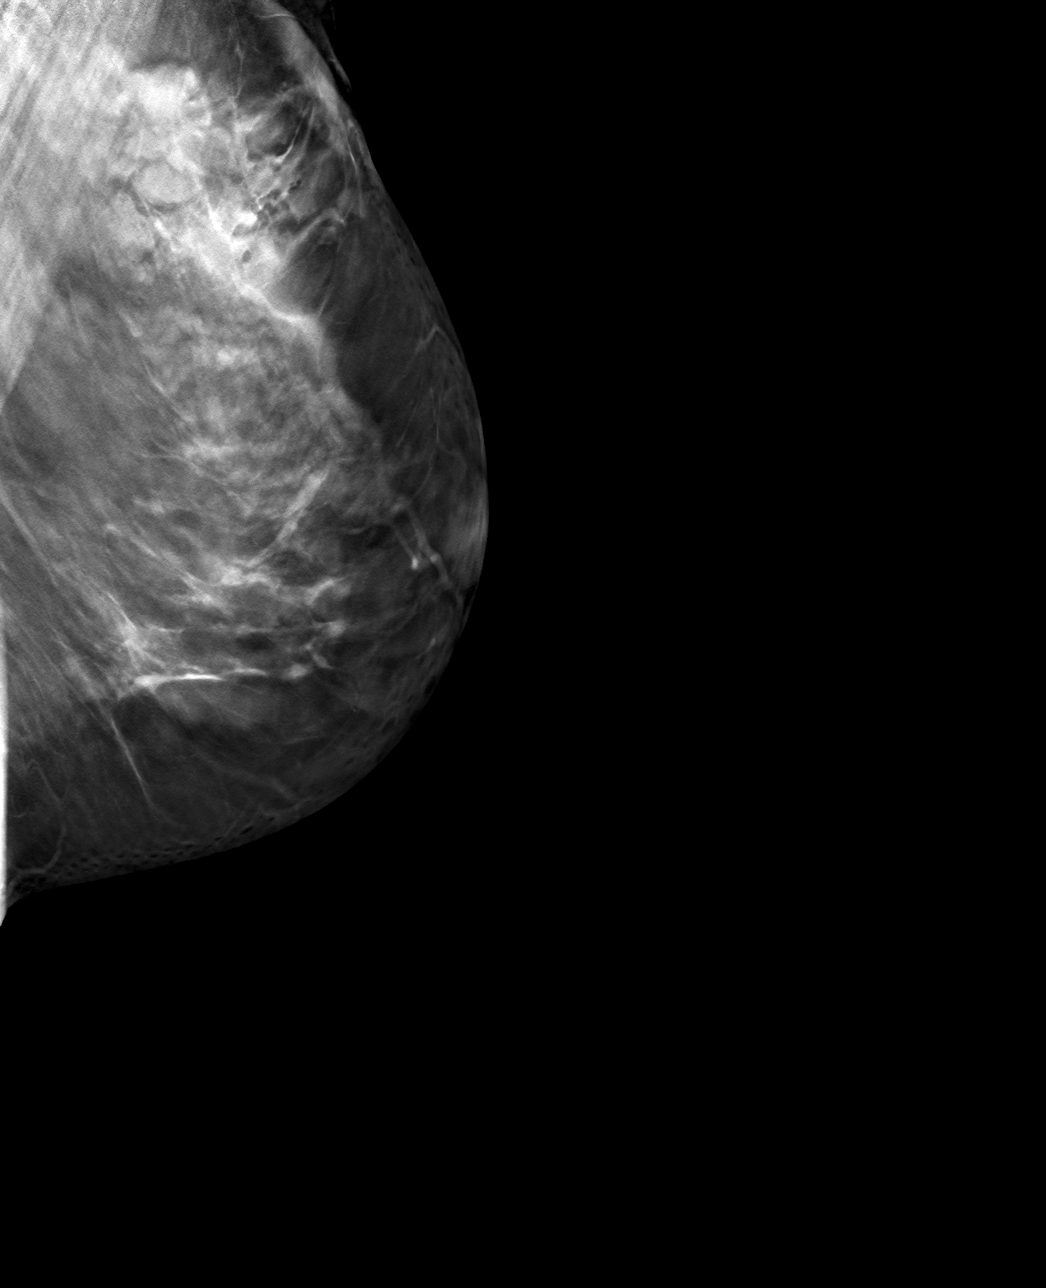

[4 of 12 positions shown; findings below may reference images not displayed]

FINDINGS: Mammographic images were obtained following ultrasound guided biopsy
of mass in the 11 o'clock location of the LEFT breast and placement
of a ribbon shaped clip. The biopsy marking clip is in expected
position at the site of biopsy.
IMPRESSION: Appropriate positioning of the ribbon shaped biopsy marking clip at
the site of biopsy in the mass in the 11 o'clock location of the
LEFT breast.

Final Assessment: Post Procedure Mammograms for Marker Placement

## 2020-04-19 ENCOUNTER — Ambulatory Visit: Payer: Self-pay | Admitting: Surgery

## 2020-04-19 DIAGNOSIS — C50912 Malignant neoplasm of unspecified site of left female breast: Secondary | ICD-10-CM

## 2020-04-19 NOTE — H&P (Signed)
Natalie Griffin Appointment: 04/19/2020 11:50 AM Location: [facility] Patient #: [id number] DOB: 01-04-1976 Single / Language: Cleophus Molt / Race: White Female  History of Present Illness Marcello Moores A. Parrish Bonn MD; 04/19/2020 1:06 PM) Patient words: Patient presents for evaluation of palpable abnormality within the left breast. She was seen 2 weeks ago but had refused core biopsy. 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 She is here today to discuss treatment options.  EXAM: DIGITAL DIAGNOSTIC BILATERAL MAMMOGRAM WITH CAD AND TOMO  ULTRASOUND BILATERAL BREAST  COMPARISON: Previous exam(s).  ACR Breast Density Category c: The breast tissue is heterogeneously dense, which may obscure small masses.  FINDINGS: Underlying the palpable marker within the left breast is a lobular mass.  Within the upper inner right breast posterior depth there is dense fibroglandular tissue, further evaluated with spot compression cc tomosynthesis images.  No additional suspicious findings within the right or left breast.  Mammographic images were processed with CAD.  On physical exam, there is a palpable mass within the superior left breast.  Targeted ultrasound is performed, showing a 3.0 x 1.7 x 2.6 cm irregular hypoechoic left breast mass 11 o'clock position 10 cm from nipple the site of palpable concern.  No left axillary adenopathy.  No suspicious abnormality within the upper inner or upper-outer right breast.  Normal right axillary lymph nodes.  IMPRESSION: Suspicious palpable mass within the left breast 11 o'clock position.  RECOMMENDATION: Ultrasound-guided core needle biopsy palpable suspicious left breast mass 11 o'clock position.  I have discussed the findings and recommendations with the patient. If applicable, a reminder letter will be sent to the  patient regarding the next appointment.  BI-RADS CATEGORY 5: Highly suggestive of malignancy.   Electronically Signed By: Lovey Newcomer M.D. On: 03/08/2020 16:10           ADDITIONAL INFORMATION: PROGNOSTIC INDICATORS Results: IMMUNOHISTOCHEMICAL AND MORPHOMETRIC ANALYSIS PERFORMED MANUALLY The tumor cells are POSITIVE for Her2 (3+). Estrogen Receptor: 90%, POSITIVE, STRONG-MODERATE STAINING INTENSITY Progesterone Receptor: 90%, POSITIVE, MODERATE STAINING INTENSITY Proliferation Marker Ki67: 30% REFERENCE RANGE ESTROGEN RECEPTOR NEGATIVE 0% POSITIVE =>1% REFERENCE RANGE PROGESTERONE RECEPTOR NEGATIVE 0% POSITIVE =>1% All controls stained appropriately Thressa Sheller MD Pathologist, Electronic Signature ( Signed 04/17/2020) FINAL DIAGNOSIS Diagnosis Breast, left, needle core biopsy, 11 o'clock, 10cmfn, ribbon clip - INVASIVE DUCTAL CARCINOMA. DUCTAL CARCINOMA IN SITU. 1 of 3 FINAL for Natalie Griffin, Natalie Griffin ([id number]) Microscopic Comment The carcinoma appears grade 2. The greatest linear extent of tumor in any one core is 10 mm. Ancillary studies will be reported separately. Results reported to [facility] on 04/12/2020. Dr. Tresa Moore reviewed. Gillie Manners MD Pathologist, Electronic Signature (Case signed 04/12/2020) Specimen Gross and Clinical Information Specimen Comment TIF: 8:03 AM; extracted < 5 minutes; suspicious irregular mass Specimen(s) Obtained: Breast, left, needle core biopsy, 11 o'clock, 10cmfn, ribbon clip Specimen Clinical.  The patient is a 44 year old female.   Allergies Darden Palmer, [place]; 04/19/2020 12:06 PM) Tetanus Toxoid Adsorbed *TOXOIDS* Shellfish-derived Products Allergies Reconciled  Medication History Darden Palmer, [place]; 04/19/2020 12:06 PM) Medications Reconciled    Vitals Lattie Haw Caldwell RMA; 04/19/2020 12:07 PM) 04/19/2020 12:06 PM Weight: 183.13 lb Height: 65in Body Surface Area: 1.91 m  Body Mass Index: 30.47 kg/m  Temp.: 98.15F  Pulse: 91 (Regular)  P.OX: 97% (Room air) BP: 140/88(Sitting, Left Arm, Standard)        Physical Exam (Varonica Siharath A. Paizlie Klaus MD; 04/19/2020 1:06 PM)  General Mental Status-Alert. General Appearance-Consistent with stated age. Hydration-Well hydrated. Voice-Normal.  Breast Note: Not repeated today.  Neurologic Neurologic evaluation reveals -alert and oriented x 3 with no impairment of recent or remote memory. Mental Status-Normal.    Assessment & Plan (Embrie Mikkelsen A. Spiro Ausborn MD; 04/19/2020 1:07 PM)  BREAST CANCER, STAGE 2, LEFT (C50.912) Impression: Total time 30 minutes discussion treatment options, review of pathology, review of mammogram, face-to-face time and discussion of surgical and medical options of treatment. Discuss neoadjuvant chemotherapy versus postoperative chemotherapy. Either way lumpectomy be possible. We discussed port placement as well. I feel she would be a good breast conserving candidate and that is what she would like to pursue. Refer to medical and radiation oncology. Schedule for left breast seed lumpectomy with left axillary sentinel lymph mapping and placement of Port-A-Cath. Risk of lumpectomy include bleeding, infection, seroma, more surgery, use of seed/wire, wound care, cosmetic deformity and the need for other treatments, death , blood clots, death. Pt agrees to proceed. Risk of sentinel lymph node mapping include bleeding, infection, lymphedema, shoulder pain. stiffness, dye allergy. cosmetic deformity , blood clots, death, need for more surgery. Pt agrees to proceed. Pt requires port placement for chemotherapy. Risk include bleeding, infection, pneumothorax, hemothorax, mediastinal injury, nerve injury , blood vessel injury, stroke, blood clots, death, migration. embolization and need for additional procedures. Pt agrees to proceed.  Current Plans Started Ativan 0.5 MG Oral Tablet, 1 (one)  Tablet three times daily, as needed, #12, 04/19/2020, No Refill. You are being scheduled for surgery- Our schedulers will call you.  You should hear from our office's scheduling department within 5 working days about the location, date, and time of surgery. We try to make accommodations for patient's preferences in scheduling surgery, but sometimes the OR schedule or the surgeon's schedule prevents [place] from making those accommodations.  If you have not heard from our office [phone]) in 5 working days, call the office and ask for your surgeon's nurse.  If you have other questions about your diagnosis, plan, or surgery, call the office and ask for your surgeon's nurse.  We discussed the staging and pathophysiology of breast cancer. We discussed all of the different options for treatment for breast cancer including surgery, chemotherapy, radiation therapy, Herceptin, and antiestrogen therapy. We discussed a sentinel lymph node biopsy as she does not appear to having lymph node involvement right now. We discussed the performance of that with injection of radioactive tracer and blue dye. We discussed that she would have an incision underneath her axillary hairline. We discussed that there is a bout a 10-20% chance of having a positive node with a sentinel lymph node biopsy and we will await the permanent pathology to make any other first further decisions in terms of her treatment. One of these options might be to return to the operating room to perform an axillary lymph node dissection. We discussed about a 1-2% risk lifetime of chronic shoulder pain as well as lymphedema associated with a sentinel lymph node biopsy. We discussed the options for treatment of the breast cancer which included lumpectomy versus a mastectomy. We discussed the performance of the lumpectomy with a wire placement. We discussed a 10-20% chance of a positive margin requiring reexcision in the operating room. We also discussed  that she may need radiation therapy or antiestrogen therapy or both if she undergoes lumpectomy. We discussed the mastectomy and the postoperative care for that as well. We discussed that there is no difference in her survival whether  she undergoes lumpectomy with radiation therapy or antiestrogen therapy versus a mastectomy. There is a slight difference in the local recurrence rate being 3-5% with lumpectomy and about 1% with a mastectomy. We discussed the risks of operation including bleeding, infection, possible reoperation. She understands her further therapy will be based on what her stages at the time of her operation.  Pt Education - [facility] breast cancer surgery: discussed with patient and provided information. Pt Education - [facility] Breast Biopsy HCI: discussed with patient and provided information. Pt Education - ABC (After Breast Cancer) Class Info: discussed with patient and provided information. Use of a central venous catheter for intravenous therapy was discussed. Technique of catheter placement using ultrasound and fluoroscopy guidance was discussed. Risks such as bleeding, infection, pneumothorax, catheter occlusion, reoperation, and other risks were discussed. I noted a good likelihood this will help address the problem. Questions were answered. The patient expressed understanding & wishes to proceed.

## 2020-04-22 ENCOUNTER — Telehealth: Payer: Self-pay | Admitting: Hematology and Oncology

## 2020-04-22 NOTE — Telephone Encounter (Signed)
Received an urgent referral from Dr. Brantley Stage for new dx of breast cancer. I scheduled Natalie Griffin to see Dr. Lindi Adie on 8/5 at 345pm. I cld and lft the appt date and time on the pt's voicemail.

## 2020-04-23 ENCOUNTER — Telehealth: Payer: Self-pay | Admitting: *Deleted

## 2020-04-23 ENCOUNTER — Telehealth: Payer: Self-pay | Admitting: Hematology and Oncology

## 2020-04-23 NOTE — Telephone Encounter (Signed)
Called patient to schedule appointment from urgent referral - Dr. Brantley Stage. Patient refuse  to schedule. Patient is going back to [place] to be treated.

## 2020-04-23 NOTE — Telephone Encounter (Signed)
Received an urgent new pt referral from Dr. Brantley Stage at [facility] for breast cancer. I scheduled Ms. Lecrone to see Dr. Lindi Adie on 8/5 at 345pm and left the appt date and time on the pt's vm. I tried calling 2x today to reach the pt regarding the appt date and time and got her vm both times. Pt had cld and left a vm earlier this morning stating that she wanted to schedule an appt here instead of going to [place]. I asked the pt to call me back to confirm the appt.

## 2020-04-24 ENCOUNTER — Telehealth: Payer: Self-pay | Admitting: Hematology and Oncology

## 2020-04-24 NOTE — Telephone Encounter (Signed)
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 She wanted to schedule an appt with an oncologist. She has been rescheduled to see Dr. Lindi Adie on 8/10 at 345pm. Pt aware to arrive 15 minutes early.

## 2020-04-24 NOTE — Telephone Encounter (Signed)
Ms. Whitelaw' appt w/Dr. Lindi Adie has been cancelled due to no call back from the pt. Multiple attempts were made to inform the pt of the appt. Breast navigators have been updated.

## 2020-04-25 ENCOUNTER — Inpatient Hospital Stay: Payer: Managed Care, Other (non HMO) | Admitting: Hematology and Oncology

## 2020-04-29 ENCOUNTER — Telehealth: Payer: Self-pay | Admitting: Hematology and Oncology

## 2020-04-29 NOTE — Telephone Encounter (Signed)
Pt has been rescheduled to see Dr. Lindi Adie to 8/23 at 345pm per pt's request.

## 2020-04-30 ENCOUNTER — Ambulatory Visit: Payer: Managed Care, Other (non HMO)

## 2020-04-30 ENCOUNTER — Ambulatory Visit: Payer: Managed Care, Other (non HMO) | Admitting: Radiation Oncology

## 2020-04-30 ENCOUNTER — Ambulatory Visit: Payer: Managed Care, Other (non HMO) | Admitting: Hematology and Oncology

## 2020-05-01 ENCOUNTER — Telehealth: Payer: Self-pay | Admitting: Hematology and Oncology

## 2020-05-01 NOTE — Telephone Encounter (Signed)
Pt cld to reschedule her new pt referral to 8/19 at 4pm.

## 2020-05-02 ENCOUNTER — Other Ambulatory Visit: Payer: Self-pay | Admitting: Surgery

## 2020-05-02 DIAGNOSIS — C50912 Malignant neoplasm of unspecified site of left female breast: Secondary | ICD-10-CM

## 2020-05-08 NOTE — Progress Notes (Signed)
[facility] CONSULT NOTE  Patient Care Team: System, Pcp Not In as PCP - General  CHIEF COMPLAINTS/PURPOSE OF CONSULTATION:  Newly diagnosed breast cancer  HISTORY OF PRESENTING ILLNESS:  Natalie Griffin 44 y.o. female is here because of recent diagnosis of invasive ductal carcinoma of the left breast. Patient palpated a left breast lump. Mammogram on 03/08/20 showed a 3.0cm mass at the 11 o'clock position with no axillary adenopathy. 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 She presents to the clinic today for initial evaluation and discussion of treatment options.  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  She is originally from [facility].  She believes that she will be here at least until November.  She wishes to stay back here long-term.  However she does not know that yet.  I reviewed her records extensively and collaborated the history with the patient.  SUMMARY OF ONCOLOGIC HISTORY: Oncology History  Malignant neoplasm of upper-inner quadrant of left breast in female, estrogen receptor positive ([place])  04/11/2020 Initial Diagnosis   Patient palpated a left breast lump. Mammogram on 03/08/20 showed a 3.0cm mass at the 11 o'clock position with no axillary adenopathy. 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   05/09/2020 Cancer Staging   Staging form: Breast, AJCC 8th Edition - Clinical stage from 05/09/2020: Stage IB (cT2, cN0, cM0, G2, ER+, PR+, HER2+) - Signed by Nicholas Lose, MD on 05/09/2020   Treatment no known treatment  MEDICAL HISTORY:  Denies any prior health problems SURGICAL HISTORY: No prior surgeries SOCIAL HISTORY: Denies any tobacco alcohol or recreational drug use FAMILY HISTORY: No family history of breast cancer  ALLERGIES:  has no allergies on file.  MEDICATIONS:  No  current outpatient medications on file.   No current facility-administered medications for this visit.    REVIEW OF SYSTEMS:     All other systems were reviewed with the patient and are negative.  PHYSICAL EXAMINATION: ECOG PERFORMANCE STATUS: 1 - Symptomatic but completely ambulatory  Vitals:   05/09/20 1550  BP: 115/81  Pulse: 72  Resp: 18  Temp: 98.5 F (36.9 C)  SpO2: 98%   Filed Weights   05/09/20 1550  Weight: 183 lb 12.8 oz (83.4 kg)     RADIOGRAPHIC STUDIES: I have personally reviewed the radiological reports and agreed with the findings in the report.  ASSESSMENT AND PLAN:  Malignant neoplasm of upper-inner quadrant of left breast in female, estrogen receptor positive ([place]) 04/11/2020:Patient palpated a left breast lump. Mammogram on 03/08/20 showed a 3.0cm mass at the 11 o'clock position with no axillary adenopathy. 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  T2 N0 stage Ib  Pathology and radiology counseling: Discussed with the patient, the details of pathology including the type of breast cancer,the clinical staging, the significance of ER, PR and HER-2/neu receptors and the implications for treatment. After reviewing the pathology in detail, we proceeded to discuss the different treatment options between surgery, radiation, chemotherapy, antiestrogen therapies.  Recommendation: 1.  breast conserving surgery with sentinel lymph node biopsy 2.  Followed by adjuvant chemotherapy with Taxol Herceptin (patient does not want to receive Taxol.  She is open to receiving subcutaneous Herceptin.) 3.  Followed by adjuvant radiation (patient wants to go back  to [place] and do proton therapy) 4.  Followed by antiestrogen therapy  Chemotherapy Counseling: I discussed the risks and benefits of chemotherapy including the risks of  infection from low WBC count, fatigue due to chemo or anemia, bruising  or bleeding due to low platelets, mouth sores, loss/ change in taste and decreased appetite. Liver and kidney function will be monitored through out chemotherapy as abnormalities in liver and kidney function may be a side effect of treatment. Cardiac dysfunction due to Herceptin and neuropathy from Taxol were discussed in detail. Risk of permanent bone marrow dysfunction and leukemia due to chemo were also discussed.  Plan: Return to clinic after surgery to discuss the final pathology report and come up with the final adjuvant plan. Patient believes in holistic medicine and does not believe in chemotherapy. She understands that her cure rate will be much lower without chemo.  She is willing to accept higher risk of recurrence or death and informs me that she would rather die than receive chemotherapy into her.  Return to clinic after surgery.    All questions were answered. The patient knows to call the clinic with any problems, questions or concerns.    Harriette Ohara, MD '@T' @   I, Molly Dorshimer, am acting as scribe for Nicholas Lose, MD.  I have reviewed the above documentation for accuracy and completeness, and I agree with the above.

## 2020-05-09 ENCOUNTER — Other Ambulatory Visit: Payer: Self-pay

## 2020-05-09 ENCOUNTER — Inpatient Hospital Stay: Payer: Managed Care, Other (non HMO) | Attending: Hematology and Oncology | Admitting: Hematology and Oncology

## 2020-05-09 DIAGNOSIS — Z170 Estrogen receptor positive status [ER+]: Secondary | ICD-10-CM

## 2020-05-09 DIAGNOSIS — C50212 Malignant neoplasm of upper-inner quadrant of left female breast: Secondary | ICD-10-CM | POA: Diagnosis not present

## 2020-05-09 NOTE — Assessment & Plan Note (Addendum)
04/11/2020:Patient palpated a left breast lump. Mammogram on 03/08/20 showed a 3.0cm mass at the 11 o'clock position with no axillary adenopathy. 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  T2 N0 stage Ib  Pathology and radiology counseling: Discussed with the patient, the details of pathology including the type of breast cancer,the clinical staging, the significance of ER, PR and HER-2/neu receptors and the implications for treatment. After reviewing the pathology in detail, we proceeded to discuss the different treatment options between surgery, radiation, chemotherapy, antiestrogen therapies.  Recommendation: 1.  Neoadjuvant chemotherapy with [place] followed by maintenance Herceptin Perjeta versus [place]cy[place] 2. followed by breast conserving surgery 3.  Followed by radiation 4.  Followed by antiestrogen therapy 5.  Genetics consult  Chemotherapy Counseling: I discussed the risks and benefits of chemotherapy including the risks of nausea/ vomiting, risk of infection from low WBC count, fatigue due to chemo or anemia, bruising or bleeding due to low platelets, mouth sores, loss/ change in taste and decreased appetite. Liver and kidney function will be monitored through out chemotherapy as abnormalities in liver and kidney function may be a side effect of treatment. Cardiac dysfunction due to Herceptin and Perjeta and neuropathy from Taxotere were discussed in detail. Risk of permanent bone marrow dysfunction and leukemia due to chemo were also discussed.  Plan: Port placement, echocardiogram, chemo class, neuropathy clinical trial Follow-up in 2 weeks to start treatment.

## 2020-05-10 ENCOUNTER — Telehealth: Payer: Self-pay | Admitting: Hematology and Oncology

## 2020-05-10 ENCOUNTER — Encounter: Payer: Self-pay | Admitting: *Deleted

## 2020-05-10 NOTE — Telephone Encounter (Signed)
Scheduled appts per 8/19 los. Left voicemail with appt date and time.

## 2020-05-13 ENCOUNTER — Ambulatory Visit: Payer: Managed Care, Other (non HMO) | Admitting: Hematology and Oncology

## 2020-05-14 ENCOUNTER — Encounter: Payer: Self-pay | Admitting: Licensed Clinical Social Worker

## 2020-05-14 NOTE — Progress Notes (Signed)
[facility] Social Work  Holiday representative contacted patient by phone  to offer support and assess for needs for patient with new diagnosis of breast cancer.   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   Support system: Has created strong friendships locally and has sisters she speaks with on the phone  Financial needs: none identified today  Patient is adjusting to diagnosis. She will be having surgery but has opted against chemo. She has had 3 times where she began to feel panicked and could not calm herself to sleep close to time of diagnosis. CSW provided empathic listening and discussed some options for coping skills, including grounding with senses and breathing. Patient currently tries walking, talking to sisters, snapping band on wrist, smelling something soothing.  Pt wants to ask Dr. Lindi Adie if he can prescribe a PRN anxiety medication. CSW also reviewed options for behavioral intervention and support and patient is open to meeting with counseling intern to continue discussion on coping skills.    Adrianah Prophete, [place], [place] Worker [facility]

## 2020-05-20 ENCOUNTER — Telehealth: Payer: Self-pay | Admitting: Licensed Clinical Social Worker

## 2020-05-20 ENCOUNTER — Telehealth: Payer: Self-pay | Admitting: Hematology and Oncology

## 2020-05-20 ENCOUNTER — Other Ambulatory Visit: Payer: Self-pay | Admitting: *Deleted

## 2020-05-20 ENCOUNTER — Other Ambulatory Visit: Payer: Self-pay | Admitting: Hematology and Oncology

## 2020-05-20 ENCOUNTER — Telehealth: Payer: Self-pay | Admitting: *Deleted

## 2020-05-20 DIAGNOSIS — C50212 Malignant neoplasm of upper-inner quadrant of left female breast: Secondary | ICD-10-CM

## 2020-05-20 MED ORDER — ALPRAZOLAM 0.25 MG PO TABS
0.2500 mg | ORAL_TABLET | Freq: Two times a day (BID) | ORAL | 0 refills | Status: DC | PRN
Start: 2020-05-20 — End: 2020-12-02

## 2020-05-20 NOTE — Telephone Encounter (Signed)
Received call from pt with complaint of increase in anxiety.  Pt states she is experiencing difficulty with the cancer diagnosis and is begining to affect her daily life.  Pt states she is experiencing panic attacks 2-3 times a week.  RN will forward message to social work and chaplin to reach out to pt and offer therapeutic support.  RN will also review with MD for further evaluation and treatment.

## 2020-05-20 NOTE — Telephone Encounter (Signed)
Scheduled per 8/30 staff msg. Called and spoke with pt, confirmed 9/27 appt

## 2020-05-20 NOTE — Progress Notes (Signed)
Severe anxiety: I sent a prescription for Xanax

## 2020-05-20 NOTE — Telephone Encounter (Signed)
I left a voicemail for the patient to call us back regarding the denial of the CT scans by her insurance.

## 2020-05-20 NOTE — Telephone Encounter (Signed)
[facility] CSW Progress Note  Clinical Social Worker attempted to contact patient by phone to follow-up on anxiety related to diagnosis. No answer. Left VM with direct contact information.    Edwinna Areola Verda Mehta , LCSW

## 2020-05-20 NOTE — Progress Notes (Signed)
Per MD request, referral placed for genetic counseling. Staff message sent to schedule apt for pt.

## 2020-05-20 NOTE — Telephone Encounter (Signed)
Patient has intense mid upper back pain which is the reason why we want to obtain the scans.  Previously she had a benign-appearing lesion in the bones.  There is a high concern for metastatic disease.  We will perform peer to peer and try to get the scans authorized.

## 2020-05-21 ENCOUNTER — Telehealth: Payer: Self-pay | Admitting: *Deleted

## 2020-05-21 NOTE — Telephone Encounter (Signed)
Left message for a return phone call with appointment for genetic counseling on 9/2 at 915am.

## 2020-05-22 ENCOUNTER — Other Ambulatory Visit: Payer: Self-pay | Admitting: *Deleted

## 2020-05-22 ENCOUNTER — Telehealth: Payer: Self-pay | Admitting: *Deleted

## 2020-05-22 DIAGNOSIS — C50212 Malignant neoplasm of upper-inner quadrant of left female breast: Secondary | ICD-10-CM

## 2020-05-22 MED ORDER — PREDNISONE 50 MG PO TABS
ORAL_TABLET | ORAL | 0 refills | Status: DC
Start: 2020-05-22 — End: 2020-12-02

## 2020-05-22 NOTE — Telephone Encounter (Signed)
Late entry - 05/21/20- Spoke with patient regarding genetic appointment. Appointment had been made for 9/2 at 915. Patient stated she thought about it and does not want genetics at this time.  Informed her if she changes her mind to let us know. She verbalized understanding.  [name] that was scheduled for 9/2 was cancelled due to insurance authorization.   05/22/20 - We now have authorization for her CT chest and I have scheduled this for 05/23/20 at 315. I have left her a message with the appointment.

## 2020-05-22 NOTE — Telephone Encounter (Signed)
Received call from pt stating she has a history of reaction to IV contrast.  Pt states she experienced hives post IV contrast administration.  Per MD pt to receive Prednisone 50 mg 13 hours before study, 7 hours before study, and 1 hour before study.  Pt also to take 50 mg benadryl one hour prior to study.  Pt educated and verbalized understanding of administration times.  Prednisone prescription sent to pharmacy on file.

## 2020-05-23 ENCOUNTER — Other Ambulatory Visit: Payer: Self-pay

## 2020-05-23 ENCOUNTER — Other Ambulatory Visit: Payer: Managed Care, Other (non HMO)

## 2020-05-23 ENCOUNTER — Ambulatory Visit (HOSPITAL_COMMUNITY): Payer: Managed Care, Other (non HMO)

## 2020-05-23 ENCOUNTER — Inpatient Hospital Stay: Payer: Managed Care, Other (non HMO) | Admitting: Genetic Counselor

## 2020-05-23 ENCOUNTER — Ambulatory Visit (HOSPITAL_COMMUNITY)
Admission: RE | Admit: 2020-05-23 | Discharge: 2020-05-23 | Disposition: A | Discharge status: Home / Self Care | Payer: Managed Care, Other (non HMO) | Source: Ambulatory Visit | Attending: Hematology and Oncology | Admitting: Hematology and Oncology

## 2020-05-23 DIAGNOSIS — Z170 Estrogen receptor positive status [ER+]: Secondary | ICD-10-CM | POA: Diagnosis present

## 2020-05-23 DIAGNOSIS — C50212 Malignant neoplasm of upper-inner quadrant of left female breast: Secondary | ICD-10-CM | POA: Diagnosis present

## 2020-05-23 IMAGING — CT CT CHEST W/ CM
2 of 3 series · 15 of 36 positions shown, 18 images · IV contrast (omnipaque)
Comparison: None

CLINICAL DATA: Breast cancer.  Staging.

EXAM:
CT CHEST WITH CONTRAST
TECHNIQUE: Multidetector CT imaging of the chest was performed during
intravenous contrast administration.
CONTRAST:  75mL OMNIPAQUE IOHEXOL 300 MG/ML  SOLN

[Series 2: axial st · axial · 0.65mm/px · z∈[-340,-64]mm · 12 of 164 slices shown, 15 images]
[im 13/164  mediastinal]
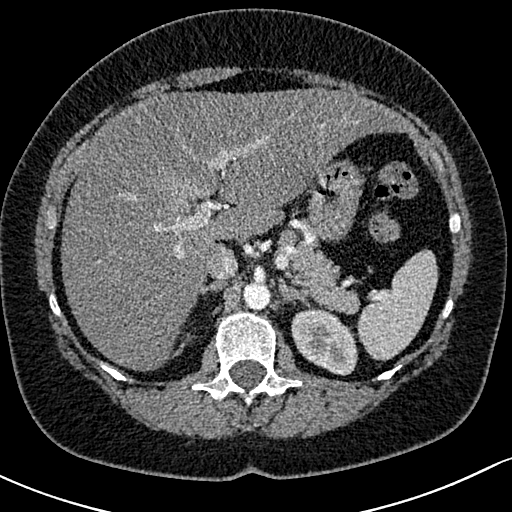
[im 13/164  lung]
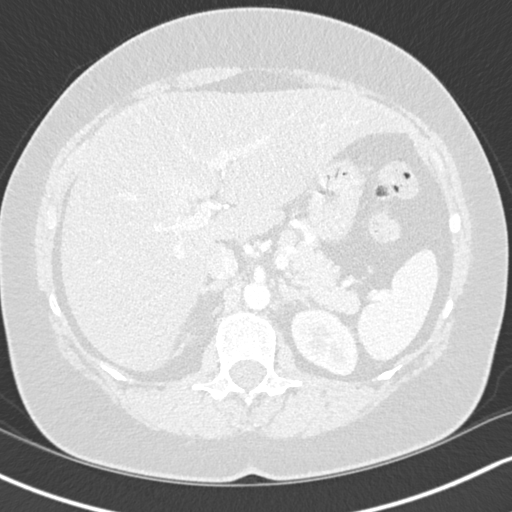
[im 25/164  lung]
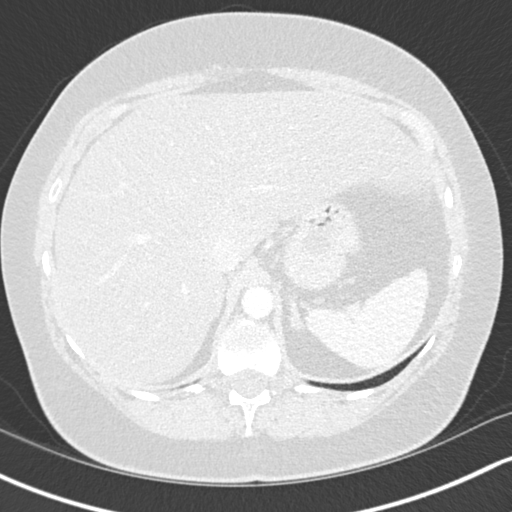
[im 37/164  lung]
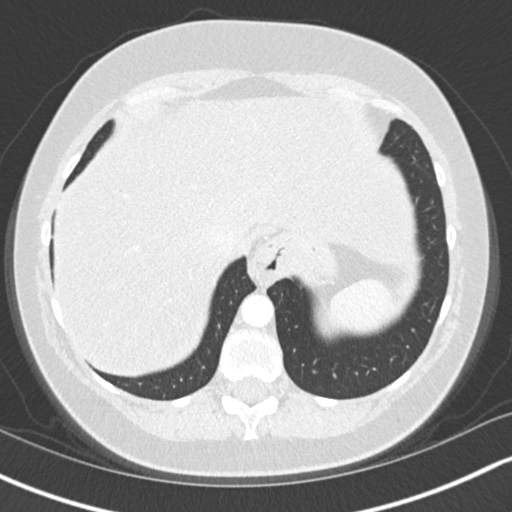
[im 49/164  lung]
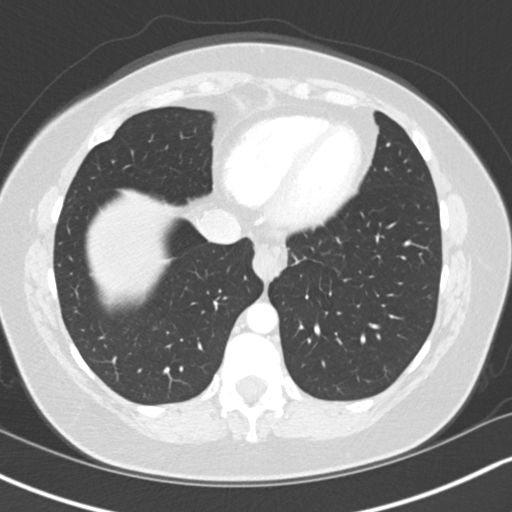
[im 61/164  mediastinal]
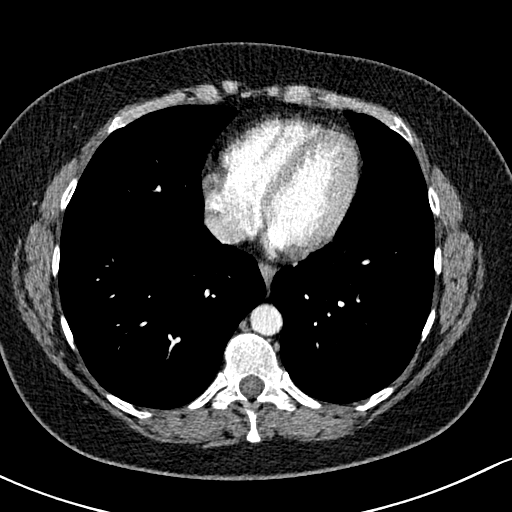
[im 61/164  lung]
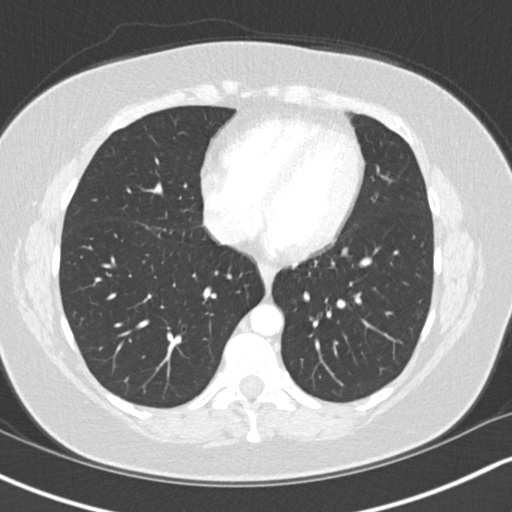
[im 73/164  lung]
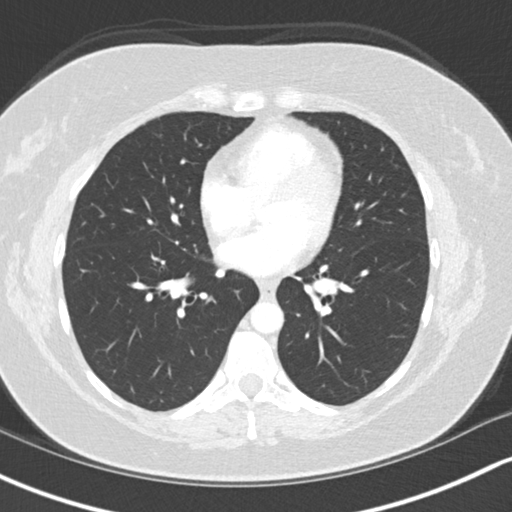
[im 91/164  lung]
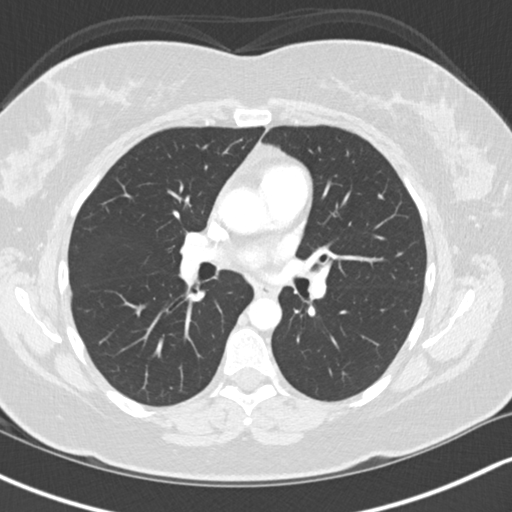
[im 103/164  lung]
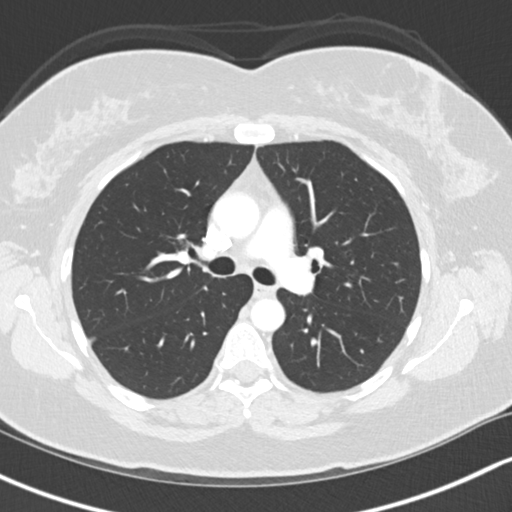
[im 115/164  mediastinal]
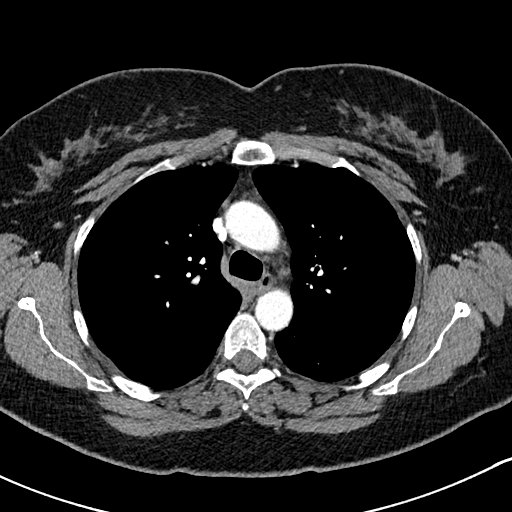
[im 115/164  lung]
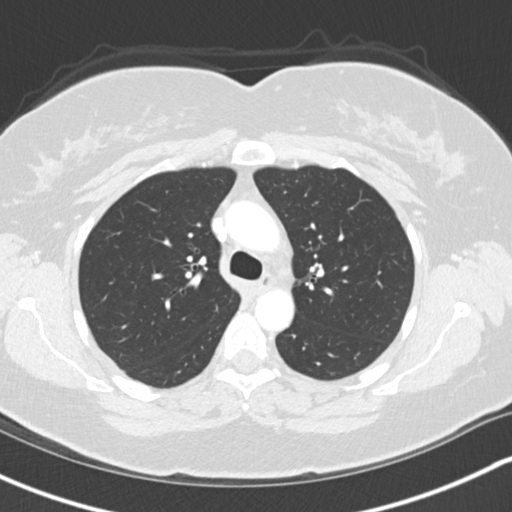
[im 127/164  lung]
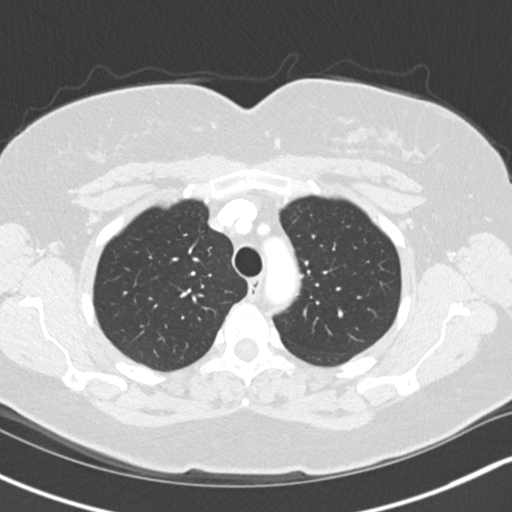
[im 139/164  lung]
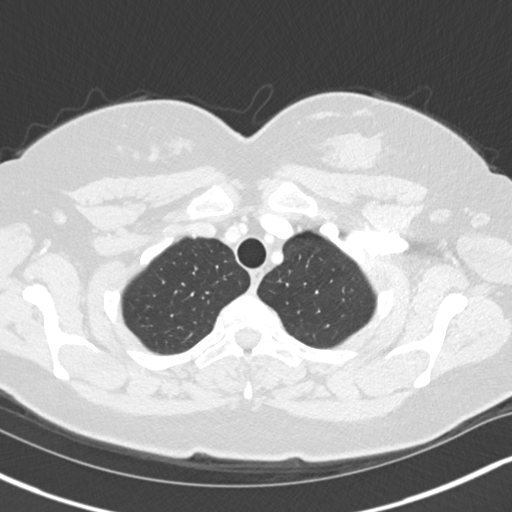
[im 151/164  lung]
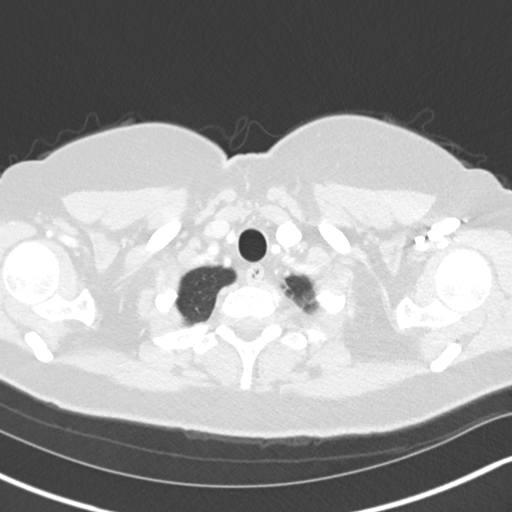

[Series 6: coronal · coronal · 0.67mm/px · 3 of 134 slices shown]
[im 27/134  lung]
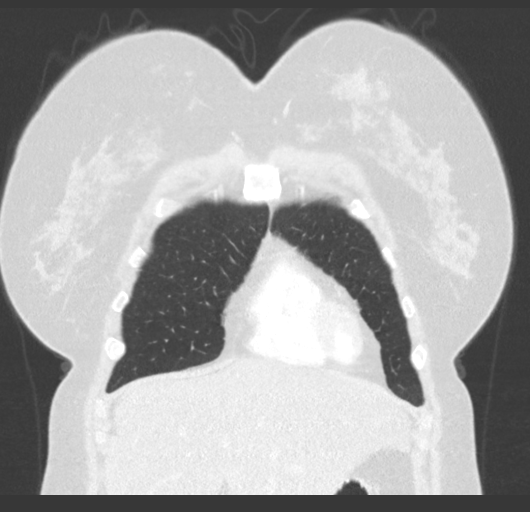
[im 54/134  lung]
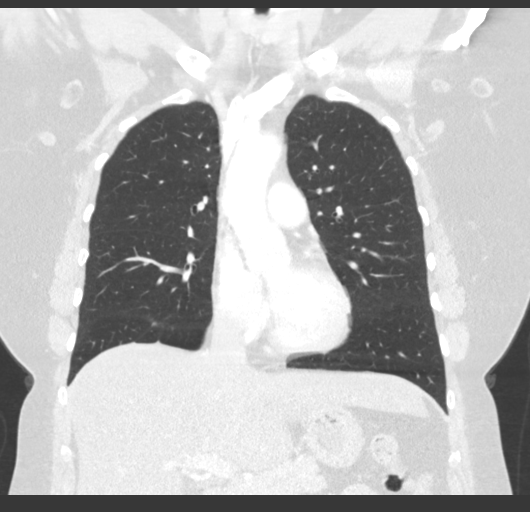
[im 80/134  lung]
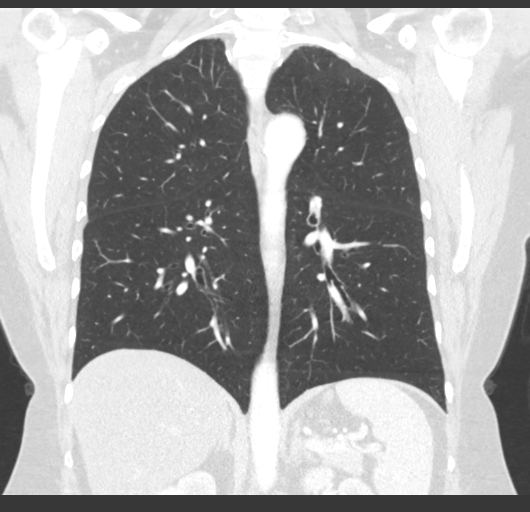

[15 of 36 positions shown; findings below may reference images not displayed]

FINDINGS: Cardiovascular: The heart size is normal. No pericardial effusion
identified.

Mediastinum/Nodes: Normal appearance of the thyroid gland. The
trachea appears patent and is midline. Normal appearance of the
esophagus. No enlarged mediastinal or hilar lymph nodes. No
axillary or supraclavicular adenopathy.

Lungs/Pleura: No pleural effusion. Nodule in the left lower lobe
measures 2 mm, image 122/5. 3 mm perifissural nodule in the right
middle lobe noted, image 82/5.

Upper Abdomen: Small hiatal hernia noted. Diffuse hepatic steatosis
suspected. No acute abnormality identified within the imaged
portions of the upper abdomen.

Musculoskeletal: No chest wall abnormality. No acute or significant
osseous findings. Sclerotic lesion within the T2 vertebra measures 7
mm and is favored to represent a benign bone island. No acute or
suspicious osseous lesions identified.

Left breast mass is identified measuring approximately 2.1 x 2.9 cm,
image [DATE].
IMPRESSION: 1. Left breast mass is identified compatible with primary breast
carcinoma. No specific findings identified to suggest metastatic
disease to the chest.
2. Small nonspecific pulmonary nodules are identified measuring up
to 3 mm. Attention on follow-up imaging advised.
3. Sclerotic lesion within the T2 vertebra is favored to represent a
benign bone island.

## 2020-05-23 MED ORDER — IOHEXOL 300 MG/ML  SOLN
75.0000 mL | Freq: Once | INTRAMUSCULAR | Status: AC | PRN
  Administered 2020-05-23: 75 mL via INTRAVENOUS

## 2020-05-24 ENCOUNTER — Encounter: Payer: Self-pay | Admitting: *Deleted

## 2020-05-24 ENCOUNTER — Encounter: Payer: Self-pay | Admitting: General Practice

## 2020-05-24 NOTE — Progress Notes (Signed)
[facility] Spiritual Care Note  Continue attempting to reach Natalie Griffin by phone per referral from [facility], but consistently receive message, "Please hang up and try again later." Will continue to phone next week as well.   [place], [place], [facility] Pager [phone] Voicemail [phone]

## 2020-05-28 ENCOUNTER — Encounter: Payer: Self-pay | Admitting: General Practice

## 2020-05-28 NOTE — Progress Notes (Addendum)
Your procedure is scheduled on Tuesday, June 04, 2020.  Report to [facility] Main Entrance "A" at 8:30 A.M., and check in at the Admitting office.  Call this number if you have problems the morning of surgery:  [phone]  Call [phone] if you have any questions prior to your surgery date Monday-Friday 8am-4pm    Remember:  Do not eat after midnight the night before your surgery  You may drink clear liquids until 7:30 AM the morning of your surgery.   Clear liquids allowed are: Water, Non-Citrus Juices (without pulp), Carbonated Beverages, Clear Tea, Black Coffee Only, and Gatorade    Take these medicines the morning of surgery with A SIP OF WATER:  ALPRAZolam Duanne Moron) - if needed  As of today, STOP taking any Aspirin (unless otherwise instructed by your surgeon) Aleve, Naproxen, Ibuprofen, Motrin, Advil, Goody's, BC's, all herbal medications, fish oil, and all vitamins.                      Do not wear jewelry, make up, or nail polish            Do not wear lotions, powders, perfumes, or deodorant.            Do not shave 48 hours prior to surgery.            Do not bring valuables to the hospital.            [facility] is not responsible for any belongings or valuables.  Do NOT Smoke (Tobacco/Vaping) or drink Alcohol 24 hours prior to your procedure If you use a CPAP at night, you may bring all equipment for your overnight stay.   Contacts, glasses, dentures or bridgework may not be worn into surgery.      For patients admitted to the hospital, discharge time will be determined by your treatment team.   Patients discharged the day of surgery will not be allowed to drive home, and someone needs to stay with them for 24 hours.    Special instructions:   [facility]- Preparing For Surgery  Before surgery, you can play an important role. Because skin is not sterile, your skin needs to be as free of germs as possible. You can reduce the number of germs on your  skin by washing with CHG (chlorahexidine gluconate) Soap before surgery.  CHG is an antiseptic cleaner which kills germs and bonds with the skin to continue killing germs even after washing.    Oral Hygiene is also important to reduce your risk of infection.  Remember - BRUSH YOUR TEETH THE MORNING OF SURGERY WITH YOUR REGULAR TOOTHPASTE  Please do not use if you have an allergy to CHG or antibacterial soaps. If your skin becomes reddened/irritated stop using the CHG.  Do not shave (including legs and underarms) for at least 48 hours prior to first CHG shower. It is OK to shave your face.  Please follow these instructions carefully.   1. Shower the NIGHT BEFORE SURGERY and the MORNING OF SURGERY with CHG Soap.   2. If you chose to wash your hair, wash your hair first as usual with your normal shampoo.  3. After you shampoo, rinse your hair and body thoroughly to remove the shampoo.  4. Use CHG as you would any other liquid soap. You can apply CHG directly to the skin and wash gently with a scrungie or a clean washcloth.   5. Apply the CHG Soap  to your body ONLY FROM THE NECK DOWN.  Do not use on open wounds or open sores. Avoid contact with your eyes, ears, mouth and genitals (private parts). Wash Face and genitals (private parts)  with your normal soap.   6. Wash thoroughly, paying special attention to the area where your surgery will be performed.  7. Thoroughly rinse your body with warm water from the neck down.  8. DO NOT shower/wash with your normal soap after using and rinsing off the CHG Soap.  9. Pat yourself dry with a CLEAN TOWEL.  10. Wear CLEAN PAJAMAS to bed the night before surgery  11. Place CLEAN SHEETS on your bed the night of your first shower and DO NOT SLEEP WITH PETS.   Day of Surgery: Wear Clean/Comfortable clothing the morning of surgery Do not apply any deodorants/lotions.   Remember to brush your teeth WITH YOUR REGULAR TOOTHPASTE.   Please read over the  following fact sheets that you were given.

## 2020-05-28 NOTE — Progress Notes (Signed)
[facility] Spiritual Care Note  Left voicemail for [place], encouraging callback.   [place], [place], [facility] Pager [phone] Voicemail [phone]

## 2020-05-29 ENCOUNTER — Encounter (HOSPITAL_COMMUNITY): Payer: Self-pay

## 2020-05-29 ENCOUNTER — Encounter (HOSPITAL_COMMUNITY)
Admission: RE | Admit: 2020-05-29 | Discharge: 2020-05-29 | Disposition: A | Discharge status: Home / Self Care | Payer: Managed Care, Other (non HMO) | Source: Ambulatory Visit | Attending: Surgery | Admitting: Surgery

## 2020-05-29 ENCOUNTER — Other Ambulatory Visit: Payer: Self-pay

## 2020-05-29 DIAGNOSIS — C50912 Malignant neoplasm of unspecified site of left female breast: Secondary | ICD-10-CM

## 2020-05-29 HISTORY — DX: Anemia, unspecified: D64.9

## 2020-05-29 HISTORY — DX: Family history of other specified conditions: Z84.89

## 2020-05-29 LAB — CBC WITH DIFFERENTIAL/PLATELET
Abs Immature Granulocytes: 0.05 10*3/uL (ref 0.00–0.07)
Basophils Absolute: 0.1 10*3/uL (ref 0.0–0.1)
Basophils Relative: 0 %
Eosinophils Absolute: 0.3 10*3/uL (ref 0.0–0.5)
Eosinophils Relative: 2 %
HCT: 39.3 % (ref 36.0–46.0)
Hemoglobin: 12.4 g/dL (ref 12.0–15.0)
Immature Granulocytes: 0 %
Lymphocytes Relative: 20 %
Lymphs Abs: 2.7 10*3/uL (ref 0.7–4.0)
MCH: 29 pg (ref 26.0–34.0)
MCHC: 31.6 g/dL (ref 30.0–36.0)
MCV: 91.8 fL (ref 80.0–100.0)
Monocytes Absolute: 0.9 10*3/uL (ref 0.1–1.0)
Monocytes Relative: 6 %
Neutro Abs: 9.5 10*3/uL — ABNORMAL HIGH (ref 1.7–7.7)
Neutrophils Relative %: 72 %
Platelets: 335 10*3/uL (ref 150–400)
RBC: 4.28 MIL/uL (ref 3.87–5.11)
RDW: 13.4 % (ref 11.5–15.5)
WBC: 13.5 10*3/uL — ABNORMAL HIGH (ref 4.0–10.5)
nRBC: 0 % (ref 0.0–0.2)

## 2020-05-29 LAB — COMPREHENSIVE METABOLIC PANEL
ALT: 21 U/L (ref 0–44)
AST: 17 U/L (ref 15–41)
Albumin: 4.1 g/dL (ref 3.5–5.0)
Alkaline Phosphatase: 53 U/L (ref 38–126)
Anion gap: 9 (ref 5–15)
BUN: 10 mg/dL (ref 6–20)
CO2: 24 mmol/L (ref 22–32)
Calcium: 9.6 mg/dL (ref 8.9–10.3)
Chloride: 103 mmol/L (ref 98–111)
Creatinine, Ser: 0.73 mg/dL (ref 0.44–1.00)
GFR calc Af Amer: >60 mL/min (ref >60)
GFR calc non Af Amer: >60 mL/min (ref >60)
Glucose, Bld: 108 mg/dL — ABNORMAL HIGH (ref 70–99)
Potassium: 3.5 mmol/L (ref 3.5–5.1)
Sodium: 136 mmol/L (ref 135–145)
Total Bilirubin: 0.6 mg/dL (ref 0.3–1.2)
Total Protein: 7.1 g/dL (ref 6.5–8.1)

## 2020-05-29 NOTE — Progress Notes (Signed)
PCP - Dr. Wadie Lessen in [place], [facility] Cardiologist - Denies  PPM/ICD -Denies   Chest x-ray - Not indicated EKG - Not indicated Stress Test - Denies ECHO - Denies Cardiac Cath - Denies  Sleep Study - Denies OSA DM - Denies  ERAS Protcol - INstructions given  COVID TEST- 05/31/20  Anesthesia review: Yes has family h/o (Father and Aunt) Pseudocholinesterase deficiency  Patient denies shortness of breath, fever, cough and chest pain at PAT appointment   All instructions explained to the patient, with a verbal understanding of the material. Patient agrees to go over the instructions while at home for a better understanding. Patient also instructed to self quarantine after being tested for COVID-19. The opportunity to ask questions was provided.

## 2020-05-29 NOTE — Anesthesia Preprocedure Evaluation (Addendum)
Anesthesia Evaluation  Patient identified by MRN, date of birth, ID band Patient awake    Reviewed: Allergy & Precautions, NPO status , Patient's Chart, lab work & pertinent test results  History of Anesthesia Complications Negative for: history of anesthetic complications  Airway Mallampati: II  TM Distance: >3 FB Neck ROM: Full    Dental  (+) Dental Advisory Given, Teeth Intact   Pulmonary neg shortness of breath, neg sleep apnea, neg COPD, neg recent URI, former smoker,  Covid-19 Nucleic Acid Test Results Lab Results      Component                Value               Date                      [place]              NEGATIVE            05/31/2020              breath sounds clear to auscultation       Cardiovascular negative cardio ROS   Rhythm:Regular     Neuro/Psych negative neurological ROS  negative psych ROS   GI/Hepatic negative GI ROS, Neg liver ROS,   Endo/Other  negative endocrine ROS  Renal/GU negative Renal ROS     Musculoskeletal negative musculoskeletal ROS (+)   Abdominal   Peds  Hematology negative hematology ROS (+) Lab Results      Component                Value               Date                      WBC                      13.5 (H)            05/29/2020                HGB                      12.4                05/29/2020                HCT                      39.3                05/29/2020                MCV                      91.8                05/29/2020                PLT                      335                 05/29/2020              Anesthesia Other Findings   Reproductive/Obstetrics Lab Results  Component                Value               Date                      PREGTESTUR               NEGATIVE            06/04/2020                                        Anesthesia Physical Anesthesia Plan  ASA: I  Anesthesia Plan: General and  Regional   Post-op Pain Management:  Regional for Post-op pain   Induction: Intravenous  PONV Risk Score and Plan: 3 and Ondansetron, Dexamethasone and Propofol infusion  Airway Management Planned: LMA  Additional Equipment: None  Intra-op Plan:   Post-operative Plan: Extubation in OR  Informed Consent: I have reviewed the patients History and Physical, chart, labs and discussed the procedure including the risks, benefits and alternatives for the proposed anesthesia with the patient or authorized representative who has indicated his/her understanding and acceptance.       Plan Discussed with: CRNA and Surgeon  Anesthesia Plan Comments: (Strong family history of pseudocholinesterase deficiency. Pt's father and paternal aunt both with prolonged respiratory paralysis following surgery. Pt has never had surgery. She has not personally undergone testing for the condition. )       Anesthesia Quick Evaluation

## 2020-05-31 ENCOUNTER — Other Ambulatory Visit (HOSPITAL_COMMUNITY)
Admission: RE | Admit: 2020-05-31 | Discharge: 2020-05-31 | Disposition: A | Discharge status: Home / Self Care | Payer: Managed Care, Other (non HMO) | Source: Ambulatory Visit | Attending: Surgery | Admitting: Surgery

## 2020-05-31 DIAGNOSIS — Z20822 Contact with and (suspected) exposure to covid-19: Secondary | ICD-10-CM | POA: Diagnosis not present

## 2020-05-31 DIAGNOSIS — Z01812 Encounter for preprocedural laboratory examination: Secondary | ICD-10-CM | POA: Insufficient documentation

## 2020-05-31 LAB — SARS CORONAVIRUS 2 (TAT 6-24 HRS): SARS Coronavirus 2: NEGATIVE

## 2020-06-03 ENCOUNTER — Other Ambulatory Visit: Payer: Self-pay | Admitting: Surgery

## 2020-06-03 ENCOUNTER — Encounter (HOSPITAL_COMMUNITY): Payer: Managed Care, Other (non HMO)

## 2020-06-03 ENCOUNTER — Other Ambulatory Visit: Payer: Self-pay

## 2020-06-03 ENCOUNTER — Ambulatory Visit
Admission: RE | Admit: 2020-06-03 | Discharge: 2020-06-03 | Disposition: A | Discharge status: Home / Self Care | Payer: Managed Care, Other (non HMO) | Source: Ambulatory Visit | Attending: Surgery | Admitting: Surgery

## 2020-06-03 DIAGNOSIS — C50912 Malignant neoplasm of unspecified site of left female breast: Secondary | ICD-10-CM

## 2020-06-03 IMAGING — MG MM BREAST LOCALIZATION CLIP
4 series · 4 of 12 positions shown · non-contrast
Comparison: Previous exam(s).

CLINICAL DATA: Localization of a known left breast cancer

EXAM:
ULTRASOUND GUIDED RADIOACTIVE SEED LOCALIZATION OF THE LEFT BREAST

[L CC synth-2D]
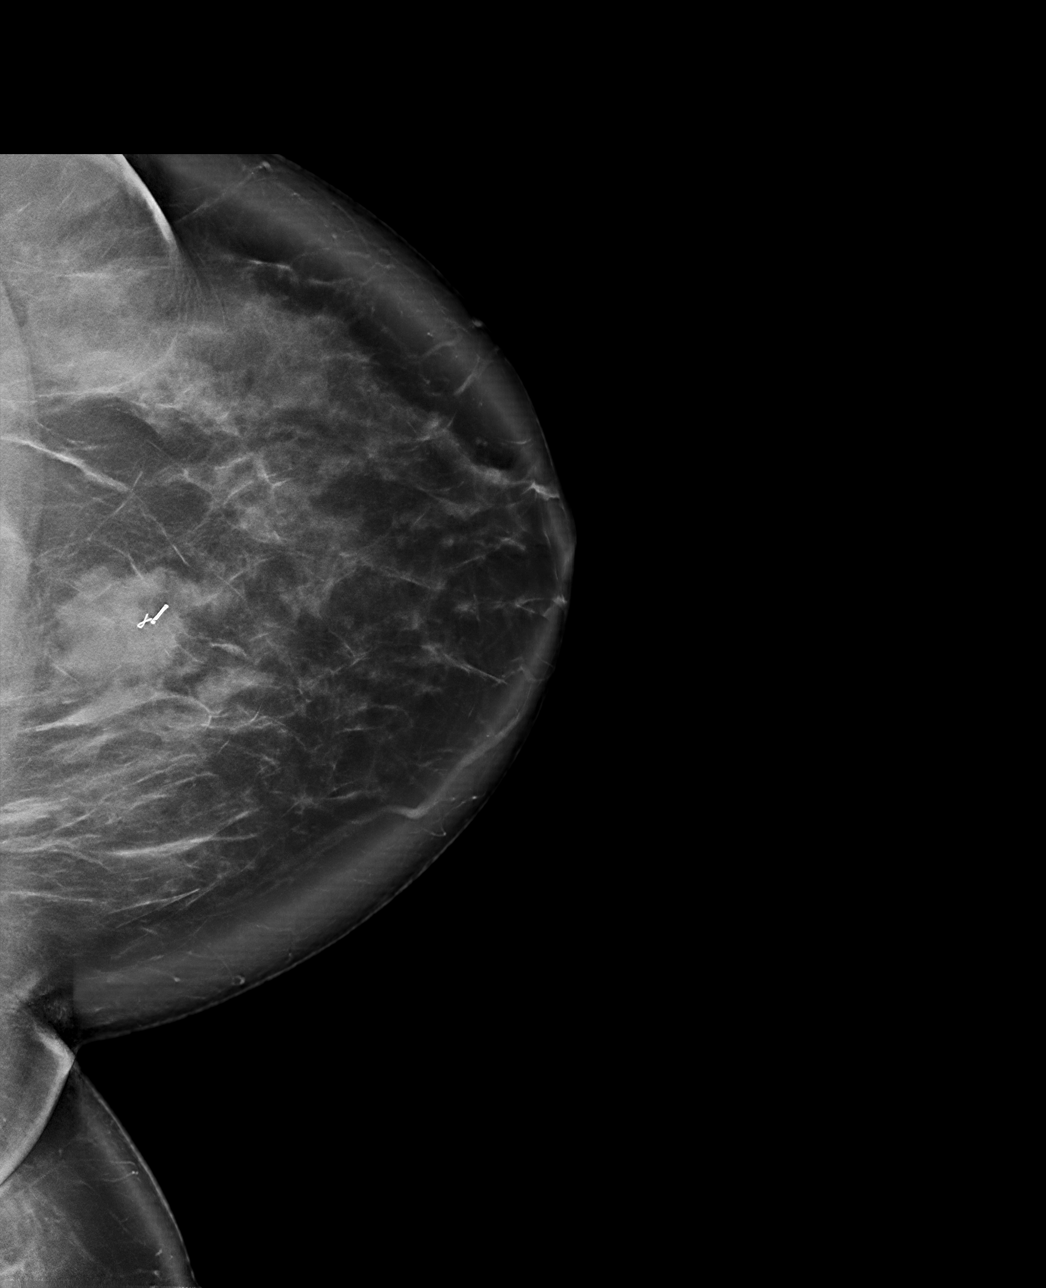

[L ML synth-2D]
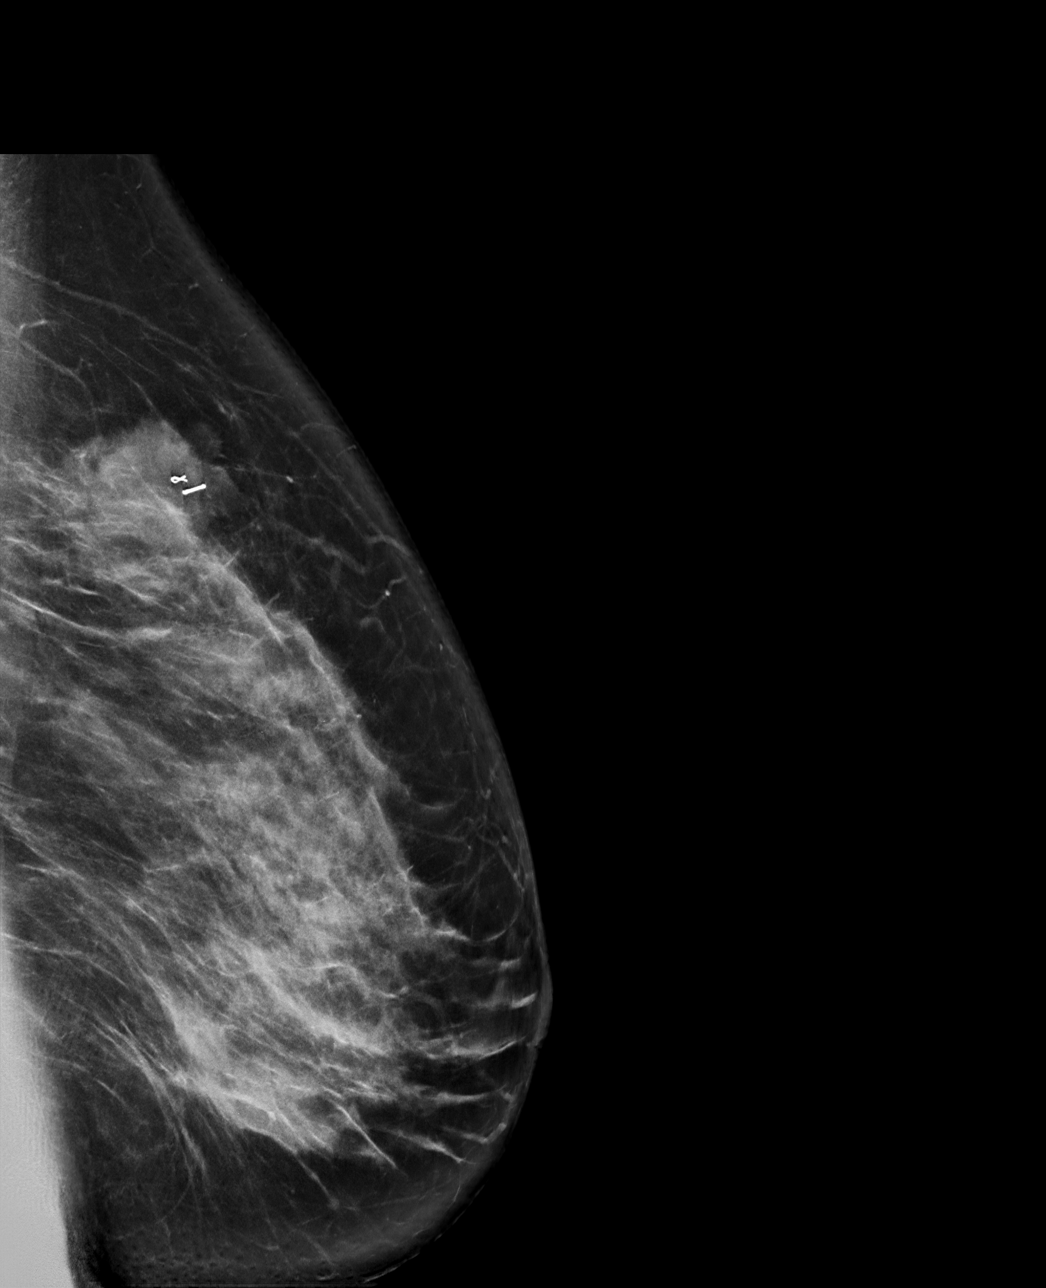

[L ML tomo · tomo slice 49/97.0]
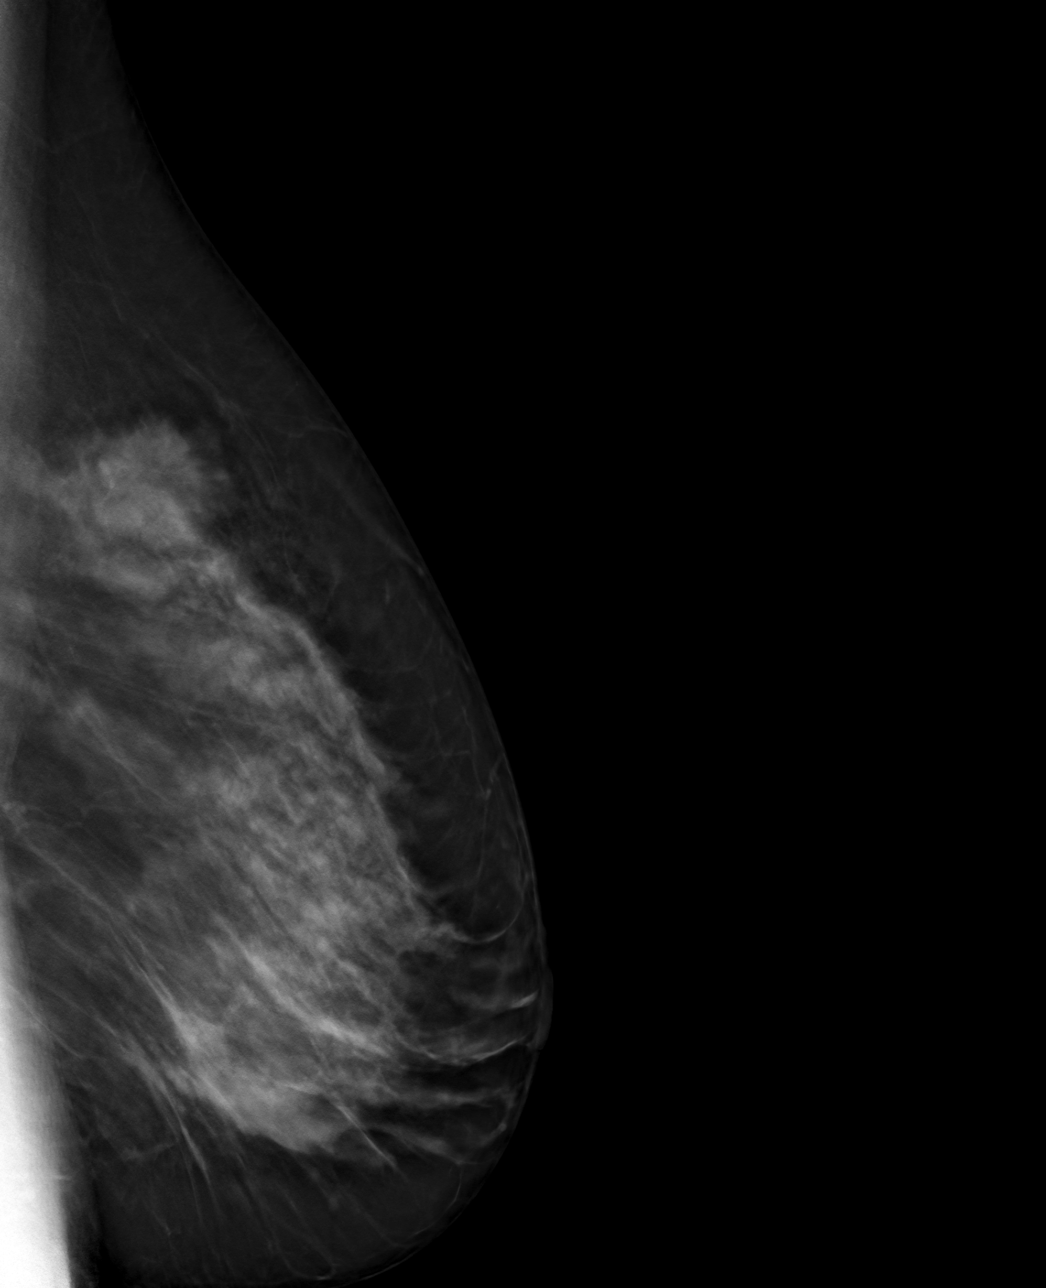

[L CC tomo · tomo slice 58/115.0]
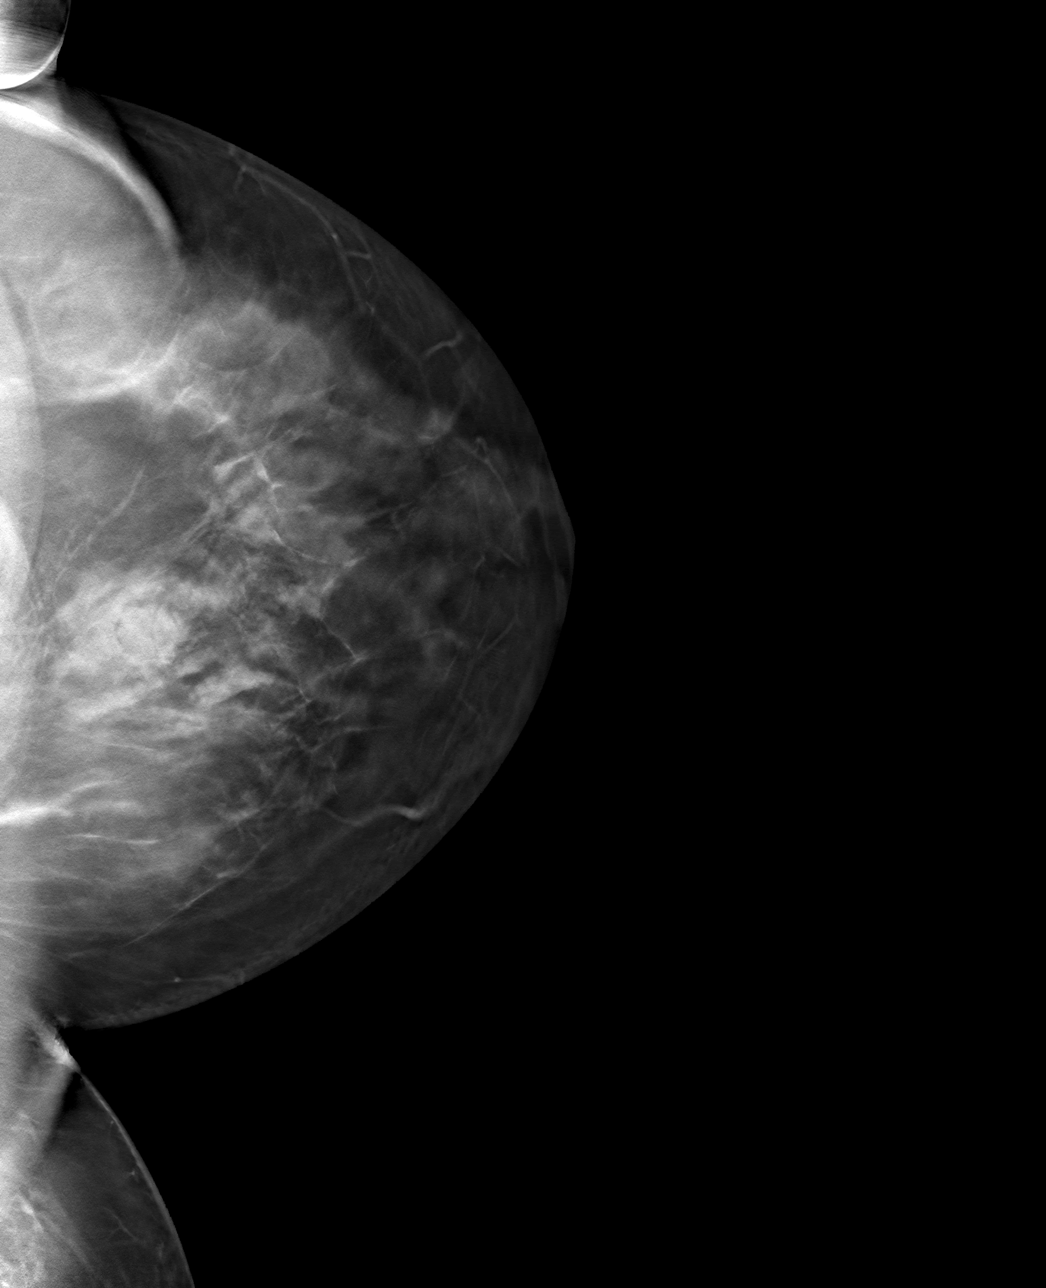

[4 of 12 positions shown; findings below may reference images not displayed]



The usual time-out protocol was performed immediately prior to the
procedure.

Using ultrasound guidance, sterile technique, 1% lidocaine and an
[HY] radioactive seed, the known malignancy at 11 o'clock in the
left breast was localized using a medial approach. The follow-up
mammogram images confirm the seed in the expected location and were
marked for the surgeon.

Follow-up survey of the patient confirms presence of the radioactive
seed.

Order number of [HY] seed:  [PHONE_NUMBER].

Total activity:  0.249 millicuries reference Date: [DATE]

The patient tolerated the procedure well and was released from the
[REDACTED]. She was given instructions regarding seed removal.

A follow-up mammogram demonstrated the radioactive seed immediately
adjacent to the biopsy clip, both located within the known
malignancy.
IMPRESSION: Radioactive seed localization left breast. No apparent
complications.

## 2020-06-03 IMAGING — US US NEEDLE LOCALIZATION*R*
1 series · 1 of 1 positions shown · non-contrast
Comparison: Previous exam(s).

CLINICAL DATA: Localization of a known left breast cancer

EXAM:
ULTRASOUND GUIDED RADIOACTIVE SEED LOCALIZATION OF THE LEFT BREAST

[Series 1: us needle localization*right* · 0.07mm/px · 1 of 1 slices shown]
[im 1/1]
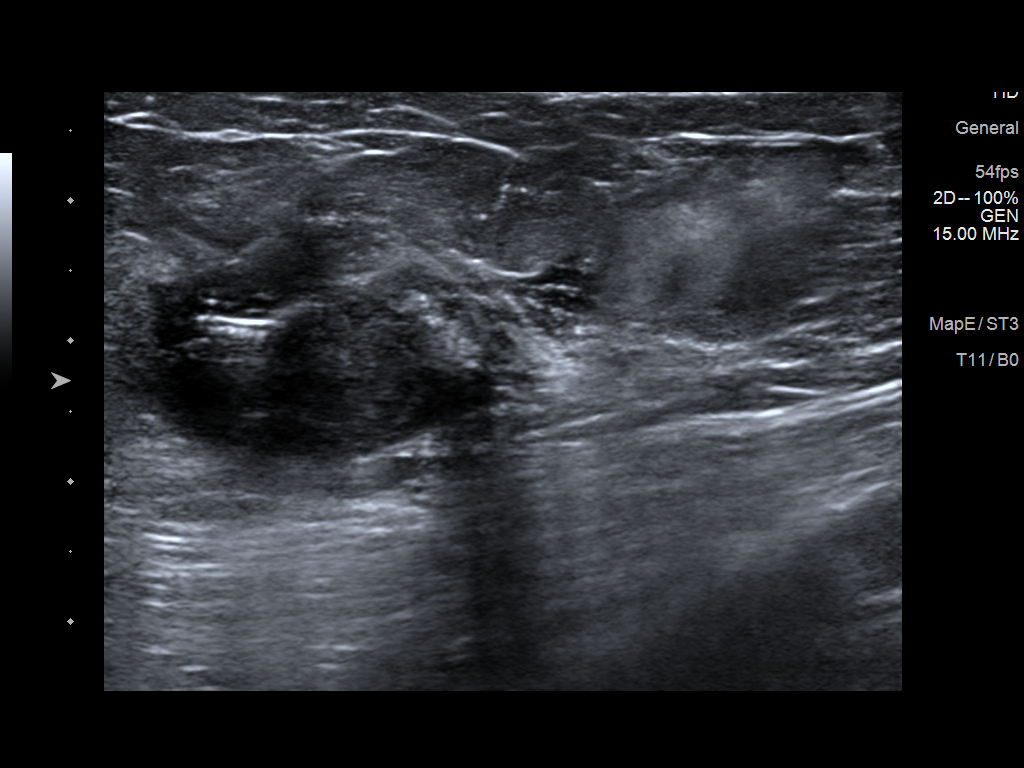

[1 of 1 positions shown; findings below may reference images not displayed]



The usual time-out protocol was performed immediately prior to the
procedure.

Using ultrasound guidance, sterile technique, 1% lidocaine and an
[HY] radioactive seed, the known malignancy at 11 o'clock in the
left breast was localized using a medial approach. The follow-up
mammogram images confirm the seed in the expected location and were
marked for the surgeon.

Follow-up survey of the patient confirms presence of the radioactive
seed.

Order number of [HY] seed:  [PHONE_NUMBER].

Total activity:  0.249 millicuries reference Date: [DATE]

The patient tolerated the procedure well and was released from the
[REDACTED]. She was given instructions regarding seed removal.

A follow-up mammogram demonstrated the radioactive seed immediately
adjacent to the biopsy clip, both located within the known
malignancy.
IMPRESSION: Radioactive seed localization left breast. No apparent
complications.

## 2020-06-04 ENCOUNTER — Ambulatory Visit (HOSPITAL_COMMUNITY): Payer: Managed Care, Other (non HMO) | Admitting: Certified Registered"

## 2020-06-04 ENCOUNTER — Encounter (HOSPITAL_COMMUNITY)
Admission: RE | Admit: 2020-06-04 | Discharge: 2020-06-04 | Disposition: A | Discharge status: Home / Self Care | Payer: Managed Care, Other (non HMO) | Source: Ambulatory Visit | Attending: Surgery | Admitting: Surgery

## 2020-06-04 ENCOUNTER — Ambulatory Visit (HOSPITAL_COMMUNITY)
Admission: RE | Admit: 2020-06-04 | Discharge: 2020-06-04 | Disposition: A | Discharge status: Home / Self Care | Payer: Managed Care, Other (non HMO) | Attending: Surgery | Admitting: Surgery

## 2020-06-04 ENCOUNTER — Other Ambulatory Visit: Payer: Self-pay

## 2020-06-04 ENCOUNTER — Encounter (HOSPITAL_COMMUNITY): Payer: Self-pay | Admitting: Surgery

## 2020-06-04 ENCOUNTER — Ambulatory Visit (HOSPITAL_COMMUNITY): Payer: Managed Care, Other (non HMO) | Admitting: Physician Assistant

## 2020-06-04 ENCOUNTER — Ambulatory Visit
Admission: RE | Admit: 2020-06-04 | Discharge: 2020-06-04 | Disposition: A | Discharge status: Home / Self Care | Payer: Managed Care, Other (non HMO) | Source: Ambulatory Visit | Attending: Surgery | Admitting: Surgery

## 2020-06-04 ENCOUNTER — Encounter (HOSPITAL_COMMUNITY)
Admission: RE | Disposition: A | Discharge status: Home / Self Care | Payer: Self-pay | Source: Home / Self Care | Attending: Surgery

## 2020-06-04 DIAGNOSIS — C50912 Malignant neoplasm of unspecified site of left female breast: Secondary | ICD-10-CM

## 2020-06-04 DIAGNOSIS — Z170 Estrogen receptor positive status [ER+]: Secondary | ICD-10-CM | POA: Insufficient documentation

## 2020-06-04 DIAGNOSIS — C50412 Malignant neoplasm of upper-outer quadrant of left female breast: Secondary | ICD-10-CM | POA: Diagnosis not present

## 2020-06-04 HISTORY — PX: BREAST LUMPECTOMY WITH RADIOACTIVE SEED AND SENTINEL LYMPH NODE BIOPSY: SHX6550

## 2020-06-04 LAB — POCT PREGNANCY, URINE: Preg Test, Ur: NEGATIVE

## 2020-06-04 IMAGING — MG MM BREAST SURGICAL SPECIMEN
1 series · 1 of 1 positions shown · non-contrast
Comparison: Previous exam(s).

CLINICAL DATA: Evaluate surgical specimen following LEFT lumpectomy
for breast cancer.

EXAM:
SPECIMEN RADIOGRAPH OF THE LEFT BREAST

[L]
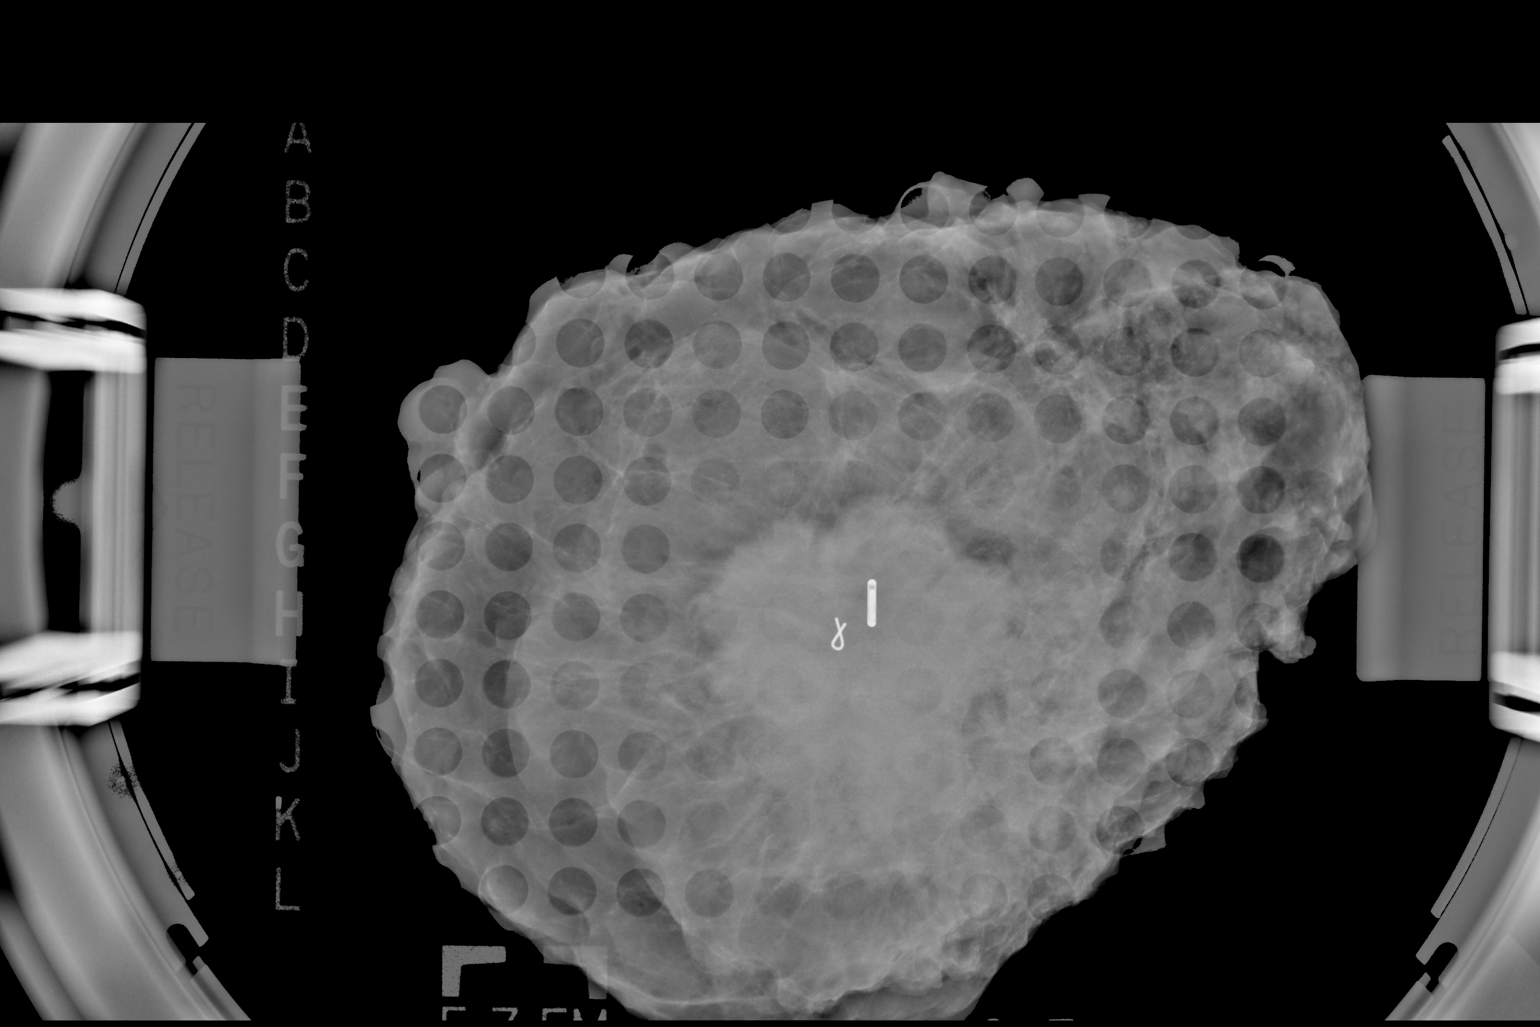

[1 of 1 positions shown; findings below may reference images not displayed]

FINDINGS: Status post excision of the LEFT breast. The radioactive seed and
biopsy marker clip are present, completely intact, and were marked
for pathology.
IMPRESSION: Specimen radiograph of the LEFT breast.

## 2020-06-04 SURGERY — BREAST LUMPECTOMY WITH RADIOACTIVE SEED AND SENTINEL LYMPH NODE BIOPSY
Anesthesia: Regional | Site: Breast | Laterality: Left

## 2020-06-04 MED ORDER — ACETAMINOPHEN 160 MG/5ML PO SOLN: 1000.0000 mg | Freq: Once | ORAL | Status: DC | PRN

## 2020-06-04 MED ORDER — GABAPENTIN 300 MG PO CAPS
ORAL_CAPSULE | ORAL | Status: AC
  Administered 2020-06-04: 300 mg via ORAL
  Filled 2020-06-04: qty 1

## 2020-06-04 MED ORDER — FENTANYL CITRATE (PF) 250 MCG/5ML IJ SOLN
INTRAMUSCULAR | Status: AC
  Filled 2020-06-04: qty 5

## 2020-06-04 MED ORDER — ACETAMINOPHEN 10 MG/ML IV SOLN
1000.0000 mg | Freq: Once | INTRAVENOUS | Status: DC | PRN
  Administered 2020-06-04: 1000 mg via INTRAVENOUS

## 2020-06-04 MED ORDER — MIDAZOLAM HCL 5 MG/5ML IJ SOLN
INTRAMUSCULAR | Status: DC | PRN
  Administered 2020-06-04: 2 mg via INTRAVENOUS

## 2020-06-04 MED ORDER — FENTANYL CITRATE (PF) 100 MCG/2ML IJ SOLN: 100.0000 ug | Freq: Once | INTRAMUSCULAR | Status: AC

## 2020-06-04 MED ORDER — MIDAZOLAM HCL 2 MG/2ML IJ SOLN: 2.0000 mg | Freq: Once | INTRAMUSCULAR | Status: AC

## 2020-06-04 MED ORDER — LIDOCAINE HCL (CARDIAC) PF 100 MG/5ML IV SOSY
PREFILLED_SYRINGE | INTRAVENOUS | Status: DC | PRN
  Administered 2020-06-04: 60 mg via INTRATRACHEAL

## 2020-06-04 MED ORDER — TECHNETIUM TC 99M SULFUR COLLOID FILTERED
1.0000 | Freq: Once | INTRAVENOUS | Status: AC | PRN
  Administered 2020-06-04: 1 via INTRADERMAL

## 2020-06-04 MED ORDER — PHENYLEPHRINE HCL (PRESSORS) 10 MG/ML IV SOLN
INTRAVENOUS | Status: DC | PRN
  Administered 2020-06-04 (×4): 80 ug via INTRAVENOUS

## 2020-06-04 MED ORDER — PHENYLEPHRINE HCL-NACL 10-0.9 MG/250ML-% IV SOLN
INTRAVENOUS | Status: DC | PRN
  Administered 2020-06-04: 40 ug/min via INTRAVENOUS

## 2020-06-04 MED ORDER — IBUPROFEN 800 MG PO TABS: 800.0000 mg | ORAL_TABLET | Freq: Three times a day (TID) | ORAL | 0 refills | Status: DC | PRN

## 2020-06-04 MED ORDER — BUPIVACAINE HCL (PF) 0.25 % IJ SOLN
INTRAMUSCULAR | Status: DC | PRN
  Administered 2020-06-04: 20 mL

## 2020-06-04 MED ORDER — ONDANSETRON HCL 4 MG/2ML IJ SOLN
INTRAMUSCULAR | Status: DC | PRN
  Administered 2020-06-04: 4 mg via INTRAVENOUS

## 2020-06-04 MED ORDER — GABAPENTIN 300 MG PO CAPS: 300.0000 mg | ORAL_CAPSULE | ORAL | Status: AC

## 2020-06-04 MED ORDER — 0.9 % SODIUM CHLORIDE (POUR BTL) OPTIME
TOPICAL | Status: DC | PRN
  Administered 2020-06-04: 1000 mL

## 2020-06-04 MED ORDER — BUPIVACAINE-EPINEPHRINE (PF) 0.5% -1:200000 IJ SOLN
INTRAMUSCULAR | Status: DC | PRN
  Administered 2020-06-04: 30 mL

## 2020-06-04 MED ORDER — HEMOSTATIC AGENTS (NO CHARGE) OPTIME
TOPICAL | Status: DC | PRN
  Administered 2020-06-04: 1 via TOPICAL

## 2020-06-04 MED ORDER — DEXAMETHASONE SODIUM PHOSPHATE 10 MG/ML IJ SOLN
INTRAMUSCULAR | Status: DC | PRN
  Administered 2020-06-04: 10 mg via INTRAVENOUS

## 2020-06-04 MED ORDER — ACETAMINOPHEN 10 MG/ML IV SOLN
INTRAVENOUS | Status: AC
  Filled 2020-06-04: qty 100

## 2020-06-04 MED ORDER — MIDAZOLAM HCL 2 MG/2ML IJ SOLN
INTRAMUSCULAR | Status: AC
  Filled 2020-06-04: qty 2

## 2020-06-04 MED ORDER — METHYLENE BLUE 0.5 % INJ SOLN
INTRAVENOUS | Status: AC
  Filled 2020-06-04: qty 10

## 2020-06-04 MED ORDER — PROPOFOL 10 MG/ML IV BOLUS
INTRAVENOUS | Status: DC | PRN
  Administered 2020-06-04: 175 ug/kg/min via INTRAVENOUS
  Administered 2020-06-04: 200 ug/kg/min via INTRAVENOUS
  Administered 2020-06-04: 200 mg via INTRAVENOUS

## 2020-06-04 MED ORDER — OXYCODONE HCL 5 MG PO TABS: 5.0000 mg | ORAL_TABLET | Freq: Four times a day (QID) | ORAL | 0 refills | Status: DC | PRN

## 2020-06-04 MED ORDER — FENTANYL CITRATE (PF) 250 MCG/5ML IJ SOLN
INTRAMUSCULAR | Status: DC | PRN
Start: 2020-06-04 — End: 2020-06-04
  Administered 2020-06-04 (×3): 25 ug via INTRAVENOUS

## 2020-06-04 MED ORDER — FENTANYL CITRATE (PF) 100 MCG/2ML IJ SOLN
INTRAMUSCULAR | Status: AC
  Administered 2020-06-04: 100 ug via INTRAVENOUS
  Filled 2020-06-04: qty 2

## 2020-06-04 MED ORDER — OXYCODONE HCL 5 MG PO TABS: 5.0000 mg | ORAL_TABLET | Freq: Once | ORAL | Status: DC | PRN

## 2020-06-04 MED ORDER — BUPIVACAINE HCL (PF) 0.25 % IJ SOLN
INTRAMUSCULAR | Status: AC
  Filled 2020-06-04: qty 30

## 2020-06-04 MED ORDER — CEFAZOLIN SODIUM-DEXTROSE 2-4 GM/100ML-% IV SOLN
2.0000 g | INTRAVENOUS | Status: AC
  Administered 2020-06-04: 2 g via INTRAVENOUS

## 2020-06-04 MED ORDER — LACTATED RINGERS IV SOLN: INTRAVENOUS | Status: DC | PRN

## 2020-06-04 MED ORDER — CEFAZOLIN SODIUM-DEXTROSE 2-4 GM/100ML-% IV SOLN
INTRAVENOUS | Status: AC
  Filled 2020-06-04: qty 100

## 2020-06-04 MED ORDER — ACETAMINOPHEN 500 MG PO TABS: 1000.0000 mg | ORAL_TABLET | ORAL | Status: AC

## 2020-06-04 MED ORDER — DEXMEDETOMIDINE (PRECEDEX) IN NS 20 MCG/5ML (4 MCG/ML) IV SYRINGE
PREFILLED_SYRINGE | INTRAVENOUS | Status: DC | PRN
  Administered 2020-06-04: 20 ug via INTRAVENOUS

## 2020-06-04 MED ORDER — OXYCODONE HCL 5 MG/5ML PO SOLN: 5.0000 mg | Freq: Once | ORAL | Status: DC | PRN

## 2020-06-04 MED ORDER — FENTANYL CITRATE (PF) 100 MCG/2ML IJ SOLN
INTRAMUSCULAR | Status: DC
Start: 2020-06-04 — End: 2020-06-04
  Filled 2020-06-04: qty 2

## 2020-06-04 MED ORDER — CHLORHEXIDINE GLUCONATE CLOTH 2 % EX PADS: 6.0000 | MEDICATED_PAD | Freq: Once | CUTANEOUS | Status: DC

## 2020-06-04 MED ORDER — FENTANYL CITRATE (PF) 100 MCG/2ML IJ SOLN
25.0000 ug | INTRAMUSCULAR | Status: DC | PRN
  Administered 2020-06-04 (×2): 50 ug via INTRAVENOUS

## 2020-06-04 MED ORDER — PROPOFOL 10 MG/ML IV BOLUS
INTRAVENOUS | Status: DC | PRN
  Administered 2020-06-04: 200 mg via INTRAVENOUS

## 2020-06-04 MED ORDER — ACETAMINOPHEN 500 MG PO TABS
ORAL_TABLET | ORAL | Status: AC
  Administered 2020-06-04: 1000 mg via ORAL
  Filled 2020-06-04: qty 2

## 2020-06-04 MED ORDER — ACETAMINOPHEN 500 MG PO TABS: 1000.0000 mg | ORAL_TABLET | Freq: Once | ORAL | Status: DC | PRN

## 2020-06-04 MED ORDER — CHLORHEXIDINE GLUCONATE 0.12 % MT SOLN
OROMUCOSAL | Status: AC
  Administered 2020-06-04: 15 mL
  Filled 2020-06-04: qty 15

## 2020-06-04 MED ORDER — MIDAZOLAM HCL 2 MG/2ML IJ SOLN
INTRAMUSCULAR | Status: AC
  Administered 2020-06-04: 2 mg via INTRAVENOUS
  Filled 2020-06-04: qty 2

## 2020-06-04 MED ORDER — SODIUM CHLORIDE (PF) 0.9 % IJ SOLN
INTRAMUSCULAR | Status: AC
  Filled 2020-06-04: qty 10

## 2020-06-04 SURGICAL SUPPLY — 35 items
APPLIER CLIP 9.375 MED OPEN (MISCELLANEOUS) ×2
BINDER BREAST XLRG (GAUZE/BANDAGES/DRESSINGS) ×2 IMPLANT
CANISTER SUCT 3000ML PPV (MISCELLANEOUS) ×2 IMPLANT
CHLORAPREP W/TINT 26 (MISCELLANEOUS) ×2 IMPLANT
CLIP APPLIE 9.375 MED OPEN (MISCELLANEOUS) ×1 IMPLANT
CNTNR URN SCR LID CUP LEK RST (MISCELLANEOUS) ×1 IMPLANT
CONT SPEC 4OZ STRL OR WHT (MISCELLANEOUS) ×1
COVER PROBE W GEL 5X96 (DRAPES) ×2 IMPLANT
COVER SURGICAL LIGHT HANDLE (MISCELLANEOUS) ×2 IMPLANT
COVER WAND RF STERILE (DRAPES) IMPLANT
DERMABOND ADVANCED (GAUZE/BANDAGES/DRESSINGS) ×1
DERMABOND ADVANCED .7 DNX12 (GAUZE/BANDAGES/DRESSINGS) ×1 IMPLANT
DEVICE DUBIN SPECIMEN MAMMOGRA (MISCELLANEOUS) ×2 IMPLANT
DRAPE CHEST BREAST 15X10 FENES (DRAPES) ×2 IMPLANT
ELECT CAUTERY BLADE 6.4 (BLADE) ×2 IMPLANT
ELECT REM PT RETURN 9FT ADLT (ELECTROSURGICAL) ×2
ELECTRODE REM PT RTRN 9FT ADLT (ELECTROSURGICAL) ×1 IMPLANT
GLOVE BIO SURGEON STRL SZ8 (GLOVE) ×2 IMPLANT
GLOVE BIOGEL PI IND STRL 8 (GLOVE) ×1 IMPLANT
GLOVE BIOGEL PI INDICATOR 8 (GLOVE) ×1
GOWN STRL REUS W/ TWL LRG LVL3 (GOWN DISPOSABLE) ×1 IMPLANT
GOWN STRL REUS W/ TWL XL LVL3 (GOWN DISPOSABLE) ×1 IMPLANT
GOWN STRL REUS W/TWL LRG LVL3 (GOWN DISPOSABLE) ×1
GOWN STRL REUS W/TWL XL LVL3 (GOWN DISPOSABLE) ×1
HEMOSTAT HEMOBLAST BELLOWS (HEMOSTASIS) ×2 IMPLANT
KIT BASIN OR (CUSTOM PROCEDURE TRAY) ×2 IMPLANT
KIT MARKER MARGIN INK (KITS) ×2 IMPLANT
NEEDLE HYPO 25GX1X1/2 BEV (NEEDLE) ×2 IMPLANT
NS IRRIG 1000ML POUR BTL (IV SOLUTION) ×2 IMPLANT
PACK GENERAL/GYN (CUSTOM PROCEDURE TRAY) ×2 IMPLANT
SUT MNCRL AB 4-0 PS2 18 (SUTURE) ×2 IMPLANT
SUT VIC AB 3-0 SH 18 (SUTURE) ×4 IMPLANT
SYR CONTROL 10ML LL (SYRINGE) ×2 IMPLANT
TOWEL GREEN STERILE (TOWEL DISPOSABLE) ×2 IMPLANT
TOWEL GREEN STERILE FF (TOWEL DISPOSABLE) ×2 IMPLANT

## 2020-06-04 NOTE — Transfer of Care (Signed)
Immediate Anesthesia Transfer of Care Note  Patient: Natalie Griffin  Procedure(s) Performed: LEFT BREAST LUMPECTOMY WITH RADIOACTIVE SEED AND SENTINEL LYMPH NODE MAPPING (Left Breast)  Patient Location: PACU  Anesthesia Type:General  Level of Consciousness: awake, alert  and oriented  Airway & Oxygen Therapy: Patient Spontanous Breathing and Patient connected to nasal cannula oxygen  Post-op Assessment: Report given to RN, Post -op Vital signs reviewed and stable and Patient moving all extremities X 4  Post vital signs: Reviewed and stable  Last Vitals:  Vitals Value Taken Time  BP 95/67 06/04/20 1209  Temp    Pulse 78 06/04/20 1212  Resp 11 06/04/20 1212  SpO2 95 % 06/04/20 1212  Vitals shown include unvalidated device data.  Last Pain:  Vitals:   06/04/20 0916  PainSc: 0-No pain      Patients Stated Pain Goal: 3 (30/09/23 3007)  Complications: No complications documented.

## 2020-06-04 NOTE — Interval H&P Note (Signed)
History and Physical Interval Note:  06/04/2020 9:55 AM  Natalie Griffin  has presented today for surgery, with the diagnosis of LEFT BREAST CANCER.  The various methods of treatment have been discussed with the patient and family. After consideration of risks, benefits and other options for treatment, the patient has consented to  Procedure(s) with comments: LEFT BREAST LUMPECTOMY WITH RADIOACTIVE SEED AND SENTINEL [place] (Left) - PEC BLOCK, RNFA as a surgical intervention.  The patient's history has been reviewed, patient examined, no change in status, stable for surgery.  I have reviewed the patient's chart and labs.  Questions were answered to the patient's satisfaction.     Turner Daniels MD

## 2020-06-04 NOTE — Anesthesia Postprocedure Evaluation (Signed)
Anesthesia Post Note  Patient: Psychologist, sport and exercise  Procedure(s) Performed: LEFT BREAST LUMPECTOMY WITH RADIOACTIVE SEED AND SENTINEL LYMPH NODE MAPPING (Left Breast)     Patient location during evaluation: PACU Anesthesia Type: Regional and General Level of consciousness: awake and alert Pain management: pain level controlled Vital Signs Assessment: post-procedure vital signs reviewed and stable Respiratory status: spontaneous breathing, nonlabored ventilation, respiratory function stable and patient connected to nasal cannula oxygen Cardiovascular status: blood pressure returned to baseline and stable Postop Assessment: no apparent nausea or vomiting Anesthetic complications: no   No complications documented.  Last Vitals:  Vitals:   06/04/20 1239 06/04/20 1254  BP: 105/78 103/75  Pulse: (!) 59 66  Resp: 16 14  Temp:  37 C  SpO2: 98% 97%    Last Pain:  Vitals:   06/04/20 1254  PainSc: 2                  Fannie Alomar

## 2020-06-04 NOTE — Op Note (Signed)
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  Risk and benefits discussed the treatments as well as breast conserving surgery versus mastectomy.  She opted for breast conserving surgery after reviewing all of her options as well mastectomy with reconstruction.The procedure has been discussed with the patient. Alternatives to surgery have been discussed with the patient.  Risks of surgery include bleeding,  Infection,  Seroma formation, death,  and the need for further surgery.   The patient understands and wishes to proceed.Sentinel lymph node mapping and dissection has been discussed with the patient.  Risk of bleeding,  Infection,  Seroma formation,  Additional procedures,,  Shoulder weakness ,  Shoulder stiffness,  Nerve and blood vessel injury and reaction to the mapping dyes have been discussed.  Alternatives to surgery have been discussed with the patient.  The patient agrees to proceed.    Description of procedure: The patient was met in the questions were answered.  Left breast was marked as correct site and seed verified using the neoprobe.  Underwent injection of technetium sulfur colloid per radiology protocol.  She was then brought back to the operating.  She is placed supine upon the OR table.  After induction of general esthesia left breast was prepped draped sterile fashion timeout performed.  Of note she underwent a  pectoral block per anesthesia in the holding area.  Timeout was performed.  Proper patient, site and procedure were verified.  Neoprobe was used to identify the seed in the upper central to upper outer quadrant and left breast.  Transverse incision was made over it.  Dissection was carried down all tissue and the seed and clip were excised a grossly negative margin.  Faxitron image revealed seed and clip to be in the specimen.  Clips were placed to mark the lumpectomy cavity this was infiltrated with local anesthetic.  Was then closed after assuring hemostasis with 3-0 Vicryl and 4 Monocryl.  The neoprobe settings were then changed to technetium.  Hotspot identified in left axilla.  4 cm incision was made dissection was carried down the level 1 contents.  There was 1 hot sentinel node that was identified and excised.  Background counts approached 0 upon inspection.  The long thoracic nerve, thoracodorsal trunk and axillary vein were all preserved.  There is some oozing from one of the lymph node extraction sites.  He this was controlled with cautery and hemoshield was placed.  This was held for 3 minutes with a moist lap per protocol.  Hemostasis was achieved.  Irrigation was done prior to this and hemostasis was achieved.  The wound was closed with 3-0 Vicryl and 4 Monocryl.  Dermabond was applied.  All final counts were found to be correct.  Breast binder placed.  The patient was awoke extubated taken to recovery in satisfactory condition.

## 2020-06-04 NOTE — Anesthesia Procedure Notes (Signed)
Anesthesia Regional Block: Pectoralis block   Pre-Anesthetic Checklist: ,, timeout performed, Correct Patient, Correct Site, Correct Laterality, Correct Procedure, Correct Position, site marked, Risks and benefits discussed,  Surgical consent,  Pre-op evaluation,  At surgeon's request and post-op pain management  Laterality: Left and Upper  Prep: chloraprep       Needles:  Injection technique: Single-shot     Needle Length: 9cm  Needle Gauge: 22     Additional Needles: Arrow StimuQuik ECHO Echogenic Stimulating PNB Needle  Procedures:,,,, ultrasound used (permanent image in chart),,,,  Narrative:  Start time: 06/04/2020 10:08 AM End time: 06/04/2020 10:14 AM Injection made incrementally with aspirations every 5 mL.  Performed by: Personally  Anesthesiologist: Oleta Mouse, MD

## 2020-06-04 NOTE — Anesthesia Procedure Notes (Signed)
Procedure Name: LMA Insertion Date/Time: 06/04/2020 10:33 AM Performed by: Gaylene Brooks, CRNA Pre-anesthesia Checklist: Patient identified, Emergency Drugs available, Suction available and Patient being monitored Patient Re-evaluated:Patient Re-evaluated prior to induction Oxygen Delivery Method: Circle System Utilized Preoxygenation: Pre-oxygenation with 100% oxygen Induction Type: IV induction Ventilation: Mask ventilation without difficulty LMA: LMA inserted LMA Size: 4.0 Number of attempts: 1 Airway Equipment and Method: Bite block Placement Confirmation: positive ETCO2 Tube secured with: Tape Dental Injury: Teeth and Oropharynx as per pre-operative assessment

## 2020-06-04 NOTE — H&P (Signed)
Feliz Beam  Location: [facility] Patient #: [id number] DOB: 1976-03-13 Single / Language: Cleophus Molt / Race: White Female  History of Present Illness  Patient words: Patient presents for evaluation of palpable abnormality within the left breast. She was seen 2 weeks ago but had refused core biopsy. 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 She is here today to discuss treatment options.  EXAM: DIGITAL DIAGNOSTIC BILATERAL MAMMOGRAM WITH CAD AND TOMO  ULTRASOUND BILATERAL BREAST  COMPARISON: Previous exam(s).  ACR Breast Density Category c: The breast tissue is heterogeneously dense, which may obscure small masses.  FINDINGS: Underlying the palpable marker within the left breast is a lobular mass.  Within the upper inner right breast posterior depth there is dense fibroglandular tissue, further evaluated with spot compression cc tomosynthesis images.  No additional suspicious findings within the right or left breast.  Mammographic images were processed with CAD.  On physical exam, there is a palpable mass within the superior left breast.  Targeted ultrasound is performed, showing a 3.0 x 1.7 x 2.6 cm irregular hypoechoic left breast mass 11 o'clock position 10 cm from nipple the site of palpable concern.  No left axillary adenopathy.  No suspicious abnormality within the upper inner or upper-outer right breast.  Normal right axillary lymph nodes.  IMPRESSION: Suspicious palpable mass within the left breast 11 o'clock position.  RECOMMENDATION: Ultrasound-guided core needle biopsy palpable suspicious left breast mass 11 o'clock position.  I have discussed the findings and recommendations with the patient. If applicable, a reminder letter will be sent to the patient regarding the next appointment.  BI-RADS CATEGORY 5: Highly  suggestive of malignancy.   Electronically Signed By: Lovey Newcomer M.D. On: 03/08/2020 16:10           ADDITIONAL INFORMATION: PROGNOSTIC INDICATORS Results: IMMUNOHISTOCHEMICAL AND MORPHOMETRIC ANALYSIS PERFORMED MANUALLY The tumor cells are POSITIVE for Her2 (3+). Estrogen Receptor: 90%, POSITIVE, STRONG-MODERATE STAINING INTENSITY Progesterone Receptor: 90%, POSITIVE, MODERATE STAINING INTENSITY Proliferation Marker Ki67: 30% REFERENCE RANGE ESTROGEN RECEPTOR NEGATIVE 0% POSITIVE =>1% REFERENCE RANGE PROGESTERONE RECEPTOR NEGATIVE 0% POSITIVE =>1% All controls stained appropriately Thressa Sheller MD Pathologist, Electronic Signature ( Signed 04/17/2020) FINAL DIAGNOSIS Diagnosis Breast, left, needle core biopsy, 11 o'clock, 10cmfn, ribbon clip - INVASIVE DUCTAL CARCINOMA. DUCTAL CARCINOMA IN SITU. 1 of 3 FINAL for Anastasia, Channel ([id number]) Microscopic Comment The carcinoma appears grade 2. The greatest linear extent of tumor in any one core is 10 mm. Ancillary studies will be reported separately. Results reported to [facility] on 04/12/2020. Dr. Tresa Moore reviewed. Gillie Manners MD Pathologist, Electronic Signature (Case signed 04/12/2020) Specimen Gross and Clinical Information Specimen Comment TIF: 8:03 AM; extracted < 5 minutes; suspicious irregular mass Specimen(s) Obtained: Breast, left, needle core biopsy, 11 o'clock, 10cmfn, ribbon clip Specimen Clinical.  The patient is a 44 year old female.   Allergies  Tetanus Toxoid Adsorbed *TOXOIDS* Shellfish-derived Products Allergies Reconciled  Medication History  Medications Reconciled    Vitals  04/19/2020 12:06 PM Weight: 183.13 lb Height: 65in Body Surface Area: 1.91 m Body Mass Index: 30.47 kg/m  Temp.: 98.70F  Pulse: 91 (Regular)  P.OX: 97% (Room air) BP: 140/88(Sitting, Left Arm, Standard)        Physical Exam  (Camran Keady A. Izabel Chim MD; 04/19/2020 1:06 PM)  General Mental Status-Alert. General Appearance-Consistent with stated age. Hydration-Well hydrated. Voice-Normal.  Breast Note: Not repeated today.  Neurologic Neurologic evaluation reveals -alert  and oriented x 3 with no impairment of recent or remote memory. Mental Status-Normal.    Assessment & Plan   BREAST CANCER, STAGE 2, LEFT (C50.912) Impression: Total time 30 minutes discussion treatment options, review of pathology, review of mammogram, face-to-face time and discussion of surgical and medical options of treatment. Discuss neoadjuvant chemotherapy versus postoperative chemotherapy. Either way lumpectomy be possible. We discussed port placement as well. I feel she would be a good breast conserving candidate and that is what she would like to pursue. Refer to medical and radiation oncology. Schedule for left breast seed lumpectomy with left axillary sentinel lymph mapping.Pt opted out of Taxol and will not need a port since getting Brielle herceptin.  Risk of lumpectomy include bleeding, infection, seroma, more surgery, use of seed/wire, wound care, cosmetic deformity and the need for other treatments, death , blood clots, death. Pt agrees to proceed. Risk of sentinel lymph node mapping include bleeding, infection, lymphedema, shoulder pain. stiffness, dye allergy. cosmetic deformity , blood clots, death, need for more surgery. Pt agrees to proceed. Pt requires port placement for chemotherapy. Risk include bleeding, infection, pneumothorax, hemothorax, mediastinal injury, nerve injury , blood vessel injury, stroke, blood clots, death, migration. embolization and need for additional procedures. Pt agrees to proceed.  Current Plans Started Ativan 0.5 MG Oral Tablet, 1 (one) Tablet three times daily, as needed, #12, 04/19/2020, No Refill. You are being scheduled for surgery- Our schedulers will call you.  You should hear  from our office's scheduling department within 5 working days about the location, date, and time of surgery. We try to make accommodations for patient's preferences in scheduling surgery, but sometimes the OR schedule or the surgeon's schedule prevents [place] from making those accommodations.  If you have not heard from our office [phone]) in 5 working days, call the office and ask for your surgeon's nurse.  If you have other questions about your diagnosis, plan, or surgery, call the office and ask for your surgeon's nurse.  We discussed the staging and pathophysiology of breast cancer. We discussed all of the different options for treatment for breast cancer including surgery, chemotherapy, radiation therapy, Herceptin, and antiestrogen therapy. We discussed a sentinel lymph node biopsy as she does not appear to having lymph node involvement right now. We discussed the performance of that with injection of radioactive tracer and blue dye. We discussed that she would have an incision underneath her axillary hairline. We discussed that there is a bout a 10-20% chance of having a positive node with a sentinel lymph node biopsy and we will await the permanent pathology to make any other first further decisions in terms of her treatment. One of these options might be to return to the operating room to perform an axillary lymph node dissection. We discussed about a 1-2% risk lifetime of chronic shoulder pain as well as lymphedema associated with a sentinel lymph node biopsy. We discussed the options for treatment of the breast cancer which included lumpectomy versus a mastectomy. We discussed the performance of the lumpectomy with a wire placement. We discussed a 10-20% chance of a positive margin requiring reexcision in the operating room. We also discussed that she may need radiation therapy or antiestrogen therapy or both if she undergoes lumpectomy. We discussed the mastectomy and the postoperative  care for that as well. We discussed that there is no difference in her survival whether she undergoes lumpectomy with radiation therapy or antiestrogen therapy versus a mastectomy. There is a slight difference in  the local recurrence rate being 3-5% with lumpectomy and about 1% with a mastectomy. We discussed the risks of operation including bleeding, infection, possible reoperation. She understands her further therapy will be based on what her stages at the time of her operation.  Pt Education - [facility] breast cancer surgery: discussed with patient and provided information. Pt Education - [facility] Breast Biopsy HCI: discussed with patient and provided information. Pt Education - ABC (After Breast Cancer) Class Info: discussed with patient and provided information. Pt will not need a port at this time

## 2020-06-04 NOTE — Progress Notes (Signed)
Dr. Brantley Stage paged for clarification of consent. Patient stated that the port placement is not needed.  Awaiting return call

## 2020-06-04 NOTE — Discharge Instructions (Signed)
[facility] °Office Phone Number [phone] ° °BREAST BIOPSY/ PARTIAL MASTECTOMY: POST OP INSTRUCTIONS ° °Always review your discharge instruction sheet given to you by the facility where your surgery was performed. ° °IF YOU HAVE DISABILITY OR FAMILY LEAVE FORMS, YOU MUST BRING THEM TO THE OFFICE FOR PROCESSING.  DO NOT GIVE THEM TO YOUR DOCTOR. ° °1. A prescription for pain medication may be given to you upon discharge.  Take your pain medication as prescribed, if needed.  If narcotic pain medicine is not needed, then you may take acetaminophen (Tylenol) or ibuprofen (Advil) as needed. °2. Take your usually prescribed medications unless otherwise directed °3. If you need a refill on your pain medication, please contact your pharmacy.  They will contact our office to request authorization.  Prescriptions will not be filled after 5pm or on week-ends. °4. You should eat very light the first 24 hours after surgery, such as soup, crackers, pudding, etc.  Resume your normal diet the day after surgery. °5. Most patients will experience some swelling and bruising in the breast.  Ice packs and a good support bra will help.  Swelling and bruising can take several days to resolve.  °6. It is common to experience some constipation if taking pain medication after surgery.  Increasing fluid intake and taking a stool softener will usually help or prevent this problem from occurring.  A mild laxative (Milk of Magnesia or Miralax) should be taken according to package directions if there are no bowel movements after 48 hours. °7. Unless discharge instructions indicate otherwise, you may remove your bandages 24-48 hours after surgery, and you may shower at that time.  You may have steri-strips (small skin tapes) in place directly over the incision.  These strips should be left on the skin for 7-10 days.  If your surgeon used skin glue on the incision, you may shower in 24 hours.  The glue will flake off over the  next 2-3 weeks.  Any sutures or staples will be removed at the office during your follow-up visit. °8. ACTIVITIES:  You may resume regular daily activities (gradually increasing) beginning the next day.  Wearing a good support bra or sports bra minimizes pain and swelling.  You may have sexual intercourse when it is comfortable. °a. You may drive when you no longer are taking prescription pain medication, you can comfortably wear a seatbelt, and you can safely maneuver your car and apply brakes. °b. RETURN TO WORK:  ______________________________________________________________________________________ °9. You should see your doctor in the office for a follow-up appointment approximately two weeks after your surgery.  Your doctor’s nurse will typically make your follow-up appointment when she calls you with your pathology report.  Expect your pathology report 2-3 business days after your surgery.  You may call to check if you do not hear from us after three days. °10. OTHER INSTRUCTIONS: _______________________________________________________________________________________________ _____________________________________________________________________________________________________________________________________ °_____________________________________________________________________________________________________________________________________ °_____________________________________________________________________________________________________________________________________ ° °WHEN TO CALL YOUR DOCTOR: °1. Fever over 101.0 °2. Nausea and/or vomiting. °3. Extreme swelling or bruising. °4. Continued bleeding from incision. °5. Increased pain, redness, or drainage from the incision. ° °The clinic staff is available to answer your questions during regular business hours.  Please don’t hesitate to call and ask to speak to one of the nurses for clinical concerns.  If you have a medical emergency, go to the nearest  emergency room or call 911.  A surgeon from [facility] is always on call at the hospital. ° °For further questions, please visit [email or web address]  °

## 2020-06-05 ENCOUNTER — Encounter (HOSPITAL_COMMUNITY): Payer: Self-pay | Admitting: Surgery

## 2020-06-06 LAB — SURGICAL PATHOLOGY

## 2020-06-10 ENCOUNTER — Encounter: Payer: Self-pay | Admitting: *Deleted

## 2020-06-10 ENCOUNTER — Telehealth: Payer: Self-pay

## 2020-06-10 NOTE — Telephone Encounter (Signed)
Natalie Griffin said she is doing 200% better after surgery and shifting to survivor instead of patient. No longer interested in counseling.

## 2020-06-10 NOTE — Progress Notes (Signed)
Patient Care Team: Pcp, No as PCP - General Rockwell Germany, RN as Oncology Nurse Navigator Mauro Kaufmann, RN as Oncology Nurse Navigator  DIAGNOSIS:    ICD-10-CM   1. Malignant neoplasm of upper-inner quadrant of left breast in female, estrogen receptor positive ([place])  C50.212    Z17.0     SUMMARY OF ONCOLOGIC HISTORY: Oncology History  Malignant neoplasm of upper-inner quadrant of left breast in female, estrogen receptor positive ([place])  04/11/2020 Initial Diagnosis   Patient palpated a left breast lump. Mammogram on 03/08/20 showed a 3.0cm mass at the 11 o'clock position with no axillary adenopathy. 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   05/09/2020 Cancer Staging   Staging form: Breast, AJCC 8th Edition - Clinical stage from 05/09/2020: Stage IB (cT2, cN0, cM0, G2, ER+, PR+, HER2+) - Signed by Nicholas Lose, MD on 05/09/2020   06/04/2020 Surgery   Left lumpectomy ([name]): invasive and in situ ductal carcinoma, 3.2cm, clear margins, one left axillary lymph node negative for carcinoma.      CHIEF COMPLIANT: Follow-up of left breast cancer s/p lumpectomy   INTERVAL HISTORY: Natalie Griffin is a 44 y.o. with above-mentioned history of left breast cancer. Chest CT on 05/23/20 showed the known left breast malignancy, small nonspecific pulmonary nodules, and a sclerotic T2 lesion favored to be benign. She underwent a left lumpectomy on 9/14/21with Dr. Brantley Stage for which pathology showed invasive and in situ ductal carcinoma, 3.2cm, clear margins, one left axillary lymph node negative for carcinoma. She presents to the clinic today to review the pathology report and discuss further treatment.  She is complaining of soreness in the axilla from the recent surgery.  ALLERGIES:  is allergic to adhesive [tape], tetanus toxoid, and contrast media [iodinated diagnostic agents].  MEDICATIONS:  Current Outpatient  Medications  Medication Sig Dispense Refill  . ALPRAZolam (XANAX) 0.25 MG tablet Take 1 tablet (0.25 mg total) by mouth 2 (two) times daily as needed for anxiety. 30 tablet 0  . calcium carbonate (CALCIUM 600) 600 MG TABS tablet Take 600 mg by mouth daily with breakfast.    . ibuprofen (ADVIL) 800 MG tablet Take 1 tablet (800 mg total) by mouth every 8 (eight) hours as needed. 30 tablet 0  . Multiple Vitamins-Minerals (MULTIVITAMIN WITH MINERALS) tablet Take 1 tablet by mouth daily.    Marland Kitchen oxyCODONE (OXY IR/ROXICODONE) 5 MG immediate release tablet Take 1 tablet (5 mg total) by mouth every 6 (six) hours as needed for severe pain. 15 tablet 0  . predniSONE (DELTASONE) 50 MG tablet To be taken prior to IV contrast dye 3 tablet 0   No current facility-administered medications for this visit.    PHYSICAL EXAMINATION: ECOG PERFORMANCE STATUS: 1 - Symptomatic but completely ambulatory  Vitals:   06/11/20 1435  BP: 119/87  Pulse: 89  Resp: 20  Temp: 97.6 F (36.4 C)  SpO2: 100%   Filed Weights   06/11/20 1435  Weight: 188 lb 14.4 oz (85.7 kg)    LABORATORY DATA:  I have reviewed the data as listed CMP Latest Ref Rng & Units 05/29/2020  Glucose 70 - 99 mg/dL 108(H)  BUN 6 - 20 mg/dL 10  Creatinine 0.44 - 1.00 mg/dL 0.73  Sodium 135 - 145 mmol/L 136  Potassium 3.5 - 5.1 mmol/L 3.5  Chloride 98 - 111 mmol/L 103  CO2 22 - 32 mmol/L 24  Calcium 8.9 - 10.3  mg/dL 9.6  Total Protein 6.5 - 8.1 g/dL 7.1  Total Bilirubin 0.3 - 1.2 mg/dL 0.6  Alkaline Phos 38 - 126 U/L 53  AST 15 - 41 U/L 17  ALT 0 - 44 U/L 21    Lab Results  Component Value Date   WBC 13.5 (H) 05/29/2020   HGB 12.4 05/29/2020   HCT 39.3 05/29/2020   MCV 91.8 05/29/2020   PLT 335 05/29/2020   NEUTROABS 9.5 (H) 05/29/2020    ASSESSMENT & PLAN:  Malignant neoplasm of upper-inner quadrant of left breast in female, estrogen receptor positive ([place]) 04/11/2020:Patient palpated a left breast lump. Mammogram on 03/08/20  showed a 3.0cm mass at the 11 o'clock position with no axillary adenopathy. 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   Recommendation: 1, adjuvant chemotherapy with Taxol Herceptin (patient does not want to receive Taxol.  She is open to receiving subcutaneous Herceptin.) 3.  Followed by adjuvant radiation (patient wants to go back to [place] and do proton therapy) 4.  Followed by antiestrogen therapy: Recommended tamoxifen x10 years  Return to clinic after coming back from [place] to discuss Herceptin and tamoxifen.  No orders of the defined types were placed in this encounter.  The patient has a good understanding of the overall plan. she agrees with it. she will call with any problems that may develop before the next visit here.  Total time spent: 30 mins including face to face time and time spent for planning, charting and coordination of care  Nicholas Lose, MD 06/11/2020  I, Cloyde Reams Dorshimer, am acting as scribe for Dr. Nicholas Lose.  I have reviewed the above documentation for accuracy and completeness, and I agree with the above.

## 2020-06-11 ENCOUNTER — Encounter: Payer: Self-pay | Admitting: General Practice

## 2020-06-11 ENCOUNTER — Inpatient Hospital Stay: Payer: Managed Care, Other (non HMO) | Attending: Hematology and Oncology | Admitting: Hematology and Oncology

## 2020-06-11 ENCOUNTER — Other Ambulatory Visit: Payer: Self-pay

## 2020-06-11 DIAGNOSIS — R918 Other nonspecific abnormal finding of lung field: Secondary | ICD-10-CM | POA: Insufficient documentation

## 2020-06-11 DIAGNOSIS — M899 Disorder of bone, unspecified: Secondary | ICD-10-CM | POA: Diagnosis not present

## 2020-06-11 DIAGNOSIS — Z170 Estrogen receptor positive status [ER+]: Secondary | ICD-10-CM | POA: Diagnosis not present

## 2020-06-11 DIAGNOSIS — C50212 Malignant neoplasm of upper-inner quadrant of left female breast: Secondary | ICD-10-CM | POA: Insufficient documentation

## 2020-06-11 NOTE — Assessment & Plan Note (Signed)
04/11/2020:Patient palpated a left breast lump. Mammogram on 03/08/20 showed a 3.0cm mass at the 11 o'clock position with no axillary adenopathy. 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   Treatment plan: 1, adjuvant chemotherapy with Taxol Herceptin (patient does not want to receive Taxol.  She is open to receiving subcutaneous Herceptin.) 3.  Followed by adjuvant radiation (patient wants to go back to [place] and do proton therapy) 4.  Followed by antiestrogen therapy  Return to clinic in 3 weeks to start chemo

## 2020-06-11 NOTE — Progress Notes (Signed)
[facility] Spiritual Care Note  Met Natalie Griffin briefly prior to her appointment with Dr Lindi Adie. 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   [place], [place], [facility] Pager [phone] Voicemail [phone]

## 2020-06-12 ENCOUNTER — Encounter: Payer: Self-pay | Admitting: *Deleted

## 2020-06-12 ENCOUNTER — Telehealth: Payer: Self-pay | Admitting: Hematology and Oncology

## 2020-06-12 NOTE — Telephone Encounter (Signed)
No 9/21 los, no changes made to pt schedule

## 2020-06-17 ENCOUNTER — Other Ambulatory Visit: Payer: Managed Care, Other (non HMO)

## 2020-06-17 ENCOUNTER — Encounter: Payer: Managed Care, Other (non HMO) | Admitting: Genetic Counselor

## 2020-06-17 ENCOUNTER — Other Ambulatory Visit: Payer: Self-pay | Admitting: *Deleted

## 2020-06-17 DIAGNOSIS — Z170 Estrogen receptor positive status [ER+]: Secondary | ICD-10-CM

## 2020-06-17 DIAGNOSIS — C50212 Malignant neoplasm of upper-inner quadrant of left female breast: Secondary | ICD-10-CM

## 2020-06-17 NOTE — Progress Notes (Signed)
Pt called for referral to [facility] in [place], [place]. Online referral was submitted. It included all pt information and cancer center information as well.

## 2020-06-25 ENCOUNTER — Encounter: Payer: Self-pay | Admitting: *Deleted

## 2020-06-25 ENCOUNTER — Telehealth: Payer: Self-pay | Admitting: Hematology and Oncology

## 2020-06-25 NOTE — Telephone Encounter (Signed)
Released records to [place] at [phone]    Release:  [id number]

## 2020-06-26 ENCOUNTER — Telehealth: Payer: Self-pay | Admitting: Hematology and Oncology

## 2020-06-26 NOTE — Telephone Encounter (Signed)
Release: [id number] Faxed medical records to [place] @ [facility] fax [phone]

## 2020-07-24 ENCOUNTER — Telehealth: Payer: Self-pay | Admitting: *Deleted

## 2020-07-24 ENCOUNTER — Encounter: Payer: Self-pay | Admitting: *Deleted

## 2020-07-24 NOTE — Telephone Encounter (Signed)
Spoke with patient to inquire about her appointments in [place].  She states she has an appointment 11/12 to discuss the proton therapy.  She still is undecided what she will do but hopefully after that appointment she will have more clarity.  Informed her we would follow up with her around the 2nd week in December to see if she has made any decisions.  Patient appreciative and verbalized understanding.

## 2020-09-05 ENCOUNTER — Encounter: Payer: Self-pay | Admitting: *Deleted

## 2020-09-27 ENCOUNTER — Encounter: Payer: Self-pay | Admitting: *Deleted

## 2020-09-30 ENCOUNTER — Telehealth: Payer: Self-pay | Admitting: *Deleted

## 2020-09-30 ENCOUNTER — Encounter: Payer: Self-pay | Admitting: *Deleted

## 2020-09-30 NOTE — Telephone Encounter (Signed)
Left message on patient's voicemail for a return phone call to get an update on patient's proton therapy.

## 2020-09-30 NOTE — Telephone Encounter (Signed)
Received call back from patient stating she would like follow up with Dr. Lindi Adie and that she did not pursue the proton therapy in [place].  Appt confirmed for 11/05/20 at 9:30am.  Instructed her to bring records with her to the appointment. Patient verbalized understanding.

## 2020-11-04 ENCOUNTER — Telehealth: Payer: Self-pay

## 2020-11-04 ENCOUNTER — Telehealth: Payer: Self-pay | Admitting: Hematology and Oncology

## 2020-11-04 NOTE — Telephone Encounter (Signed)
Appointment rescheduled.

## 2020-11-04 NOTE — Telephone Encounter (Signed)
Ms. Fessenden cld to reschedule her appt w/Dr. Lindi Adie to 3/14 at 930am.

## 2020-11-05 ENCOUNTER — Encounter: Payer: Self-pay | Admitting: *Deleted

## 2020-11-05 ENCOUNTER — Inpatient Hospital Stay: Payer: Self-pay | Admitting: Hematology and Oncology

## 2020-12-01 NOTE — Progress Notes (Signed)
Patient Care Team: Pcp, No as PCP - General Rockwell Germany, RN as Oncology Nurse Navigator Mauro Kaufmann, RN as Oncology Nurse Navigator  DIAGNOSIS:    ICD-10-CM   1. Malignant neoplasm of upper-inner quadrant of left breast in female, estrogen receptor positive ([place])  C50.212    Z17.0     SUMMARY OF ONCOLOGIC HISTORY: Oncology History  Malignant neoplasm of upper-inner quadrant of left breast in female, estrogen receptor positive ([place])  04/11/2020 Initial Diagnosis   Patient palpated a left breast lump. Mammogram on 03/08/20 showed a 3.0cm mass at the 11 o'clock position with no axillary adenopathy. 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   05/09/2020 Cancer Staging   Staging form: Breast, AJCC 8th Edition - Clinical stage from 05/09/2020: Stage IB (cT2, cN0, cM0, G2, ER+, PR+, HER2+) - Signed by Nicholas Lose, MD on 05/09/2020   06/04/2020 Surgery   Left lumpectomy ([name]): invasive and in situ ductal carcinoma, 3.2cm, clear margins, one left axillary lymph node negative for carcinoma.      CHIEF COMPLIANT: Follow-up of left breast cancer    INTERVAL HISTORY: Natalie Griffin is a 45 y.o. with above-mentioned history of left breast cancer who underwent a left lumpectomy. She was considering undergoing proton therapy in [place], but decided not to pursue treatment. She presents to the clinic today to discuss further treatment.    She came back from [place] after discussing about proton therapy.  She decided not to pursue it.  She does not want to do any radiation or tamoxifen.  She has made dramatic changes in her lifestyle and she is hoping that would be adequate to prevent breast cancer.  ALLERGIES:  is allergic to adhesive [tape], tetanus toxoid, and contrast media [iodinated diagnostic agents].  MEDICATIONS:  Current Outpatient Medications  Medication Sig Dispense Refill   ALPRAZolam (XANAX)  0.25 MG tablet Take 1 tablet (0.25 mg total) by mouth 2 (two) times daily as needed for anxiety. 30 tablet 0   calcium carbonate (CALCIUM 600) 600 MG TABS tablet Take 600 mg by mouth daily with breakfast.     ibuprofen (ADVIL) 800 MG tablet Take 1 tablet (800 mg total) by mouth every 8 (eight) hours as needed. 30 tablet 0   Multiple Vitamins-Minerals (MULTIVITAMIN WITH MINERALS) tablet Take 1 tablet by mouth daily.     oxyCODONE (OXY IR/ROXICODONE) 5 MG immediate release tablet Take 1 tablet (5 mg total) by mouth every 6 (six) hours as needed for severe pain. 15 tablet 0   predniSONE (DELTASONE) 50 MG tablet To be taken prior to IV contrast dye 3 tablet 0   No current facility-administered medications for this visit.    PHYSICAL EXAMINATION: ECOG PERFORMANCE STATUS: 0 - Asymptomatic  Vitals:   12/02/20 0938  BP: 101/87  Pulse: 87  Resp: 18  Temp: 98.1 F (36.7 C)  SpO2: 99%   Filed Weights   12/02/20 0938  Weight: 188 lb 4.8 oz (85.4 kg)      LABORATORY DATA:  I have reviewed the data as listed CMP Latest Ref Rng & Units 05/29/2020  Glucose 70 - 99 mg/dL 108(H)  BUN 6 - 20 mg/dL 10  Creatinine 0.44 - 1.00 mg/dL 0.73  Sodium 135 - 145 mmol/L 136  Potassium 3.5 - 5.1 mmol/L 3.5  Chloride 98 - 111 mmol/L 103  CO2 22 - 32 mmol/L 24  Calcium 8.9 - 10.3 mg/dL 9.6  Total Protein 6.5 - 8.1 g/dL 7.1  Total Bilirubin 0.3 - 1.2 mg/dL 0.6  Alkaline Phos 38 - 126 U/L 53  AST 15 - 41 U/L 17  ALT 0 - 44 U/L 21    Lab Results  Component Value Date   WBC 13.5 (H) 05/29/2020   HGB 12.4 05/29/2020   HCT 39.3 05/29/2020   MCV 91.8 05/29/2020   PLT 335 05/29/2020   NEUTROABS 9.5 (H) 05/29/2020    ASSESSMENT & PLAN:  Malignant neoplasm of upper-inner quadrant of left breast in female, estrogen receptor positive ([place]) 04/11/2020:Patient palpated a left breast lump. Mammogram on 03/08/20 showed a 3.0cm mass at the 11 o'clock position with no axillary adenopathy. Biopsy on 04/11/20  showed invasive ductal carcinoma with DCIS, grade 2, HER-2 positive (3+), ER+ 90%, PR+ 90%, Ki67 30%.  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   Treatment plan: 1, adjuvant chemotherapy with Taxol Herceptin (patient does not want to receive Taxol. She is open to receiving subcutaneous Herceptin.) 3.Followed by adjuvant radiation (patient wants to go back to [place] and do proton therapy) 4.Followed by antiestrogen therapy: Recommended tamoxifen x10 years --------------------------------------------------------------------------------------------------------------------------------- Patient has decided not to receive radiation. She also decided not to take tamoxifen. She has made dramatic changes to her lifestyle.  Surveillance: Mammograms to be done in June.  I ordered the mammograms. I discussed with her about seeing a primary care physician.  I provided her with some references.  She wants to do colon cancer surveillance.  Return to clinic in December for follow-up and then she can be seen once a year.  No orders of the defined types were placed in this encounter.  The patient has a good understanding of the overall plan. she agrees with it. she will call with any problems that may develop before the next visit here.  Total time spent: 20 mins including face to face time and time spent for planning, charting and coordination of care  Rulon Eisenmenger, MD, MPH 12/02/2020  I, Molly Dorshimer, am acting as scribe for Dr. Nicholas Lose.  I have reviewed the above documentation for accuracy and completeness, and I agree with the above.

## 2020-12-02 ENCOUNTER — Encounter: Payer: Self-pay | Admitting: *Deleted

## 2020-12-02 ENCOUNTER — Inpatient Hospital Stay: Payer: Managed Care, Other (non HMO) | Attending: Hematology and Oncology | Admitting: Hematology and Oncology

## 2020-12-02 ENCOUNTER — Other Ambulatory Visit: Payer: Self-pay

## 2020-12-02 DIAGNOSIS — C50212 Malignant neoplasm of upper-inner quadrant of left female breast: Secondary | ICD-10-CM | POA: Diagnosis present

## 2020-12-02 DIAGNOSIS — Z170 Estrogen receptor positive status [ER+]: Secondary | ICD-10-CM | POA: Insufficient documentation

## 2020-12-02 DIAGNOSIS — Z79899 Other long term (current) drug therapy: Secondary | ICD-10-CM | POA: Insufficient documentation

## 2020-12-02 DIAGNOSIS — Z9221 Personal history of antineoplastic chemotherapy: Secondary | ICD-10-CM | POA: Insufficient documentation

## 2020-12-02 DIAGNOSIS — Z923 Personal history of irradiation: Secondary | ICD-10-CM | POA: Diagnosis not present

## 2020-12-02 MED ORDER — VITAMIN D (CHOLECALCIFEROL) 25 MCG (1000 UT) PO TABS: 1.0000 | ORAL_TABLET | Freq: Every day | ORAL | Status: DC

## 2020-12-02 NOTE — Assessment & Plan Note (Signed)
04/11/2020:Patient palpated a left breast lump. Mammogram on 03/08/20 showed a 3.0cm mass at the 11 o'clock position with no axillary adenopathy. 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   Treatment plan: 1, adjuvant chemotherapy with Taxol Herceptin (patient does not want to receive Taxol. She is open to receiving subcutaneous Herceptin.) 3.Followed by adjuvant radiation (patient wants to go back to [place] and do proton therapy) 4.Followed by antiestrogen therapy: Recommended tamoxifen x10 years --------------------------------------------------------------------------------------------------------------------------------- Patient has not received any radiation treatment.

## 2021-02-04 ENCOUNTER — Other Ambulatory Visit: Payer: Self-pay | Admitting: Hematology and Oncology

## 2021-02-04 DIAGNOSIS — C50212 Malignant neoplasm of upper-inner quadrant of left female breast: Secondary | ICD-10-CM

## 2021-02-04 DIAGNOSIS — Z170 Estrogen receptor positive status [ER+]: Secondary | ICD-10-CM

## 2021-03-11 ENCOUNTER — Encounter (HOSPITAL_COMMUNITY): Payer: Self-pay | Admitting: Emergency Medicine

## 2021-03-11 ENCOUNTER — Ambulatory Visit (INDEPENDENT_AMBULATORY_CARE_PROVIDER_SITE_OTHER): Payer: 59

## 2021-03-11 ENCOUNTER — Other Ambulatory Visit: Payer: Self-pay

## 2021-03-11 ENCOUNTER — Ambulatory Visit (HOSPITAL_COMMUNITY)
Admission: EM | Admit: 2021-03-11 | Discharge: 2021-03-11 | Disposition: A | Discharge status: Home / Self Care | Payer: 59 | Attending: Emergency Medicine | Admitting: Emergency Medicine

## 2021-03-11 DIAGNOSIS — M25551 Pain in right hip: Secondary | ICD-10-CM | POA: Diagnosis not present

## 2021-03-11 DIAGNOSIS — T1490XA Injury, unspecified, initial encounter: Secondary | ICD-10-CM

## 2021-03-11 DIAGNOSIS — M545 Low back pain, unspecified: Secondary | ICD-10-CM

## 2021-03-11 IMAGING — DX DG LUMBAR SPINE COMPLETE 4+V
5 series · 5 of 5 positions shown · non-contrast
Comparison: CT chest [DATE]

CLINICAL DATA: Right hip pain

EXAM:
LUMBAR SPINE - COMPLETE 4+ VIEW

[l-spine ap]
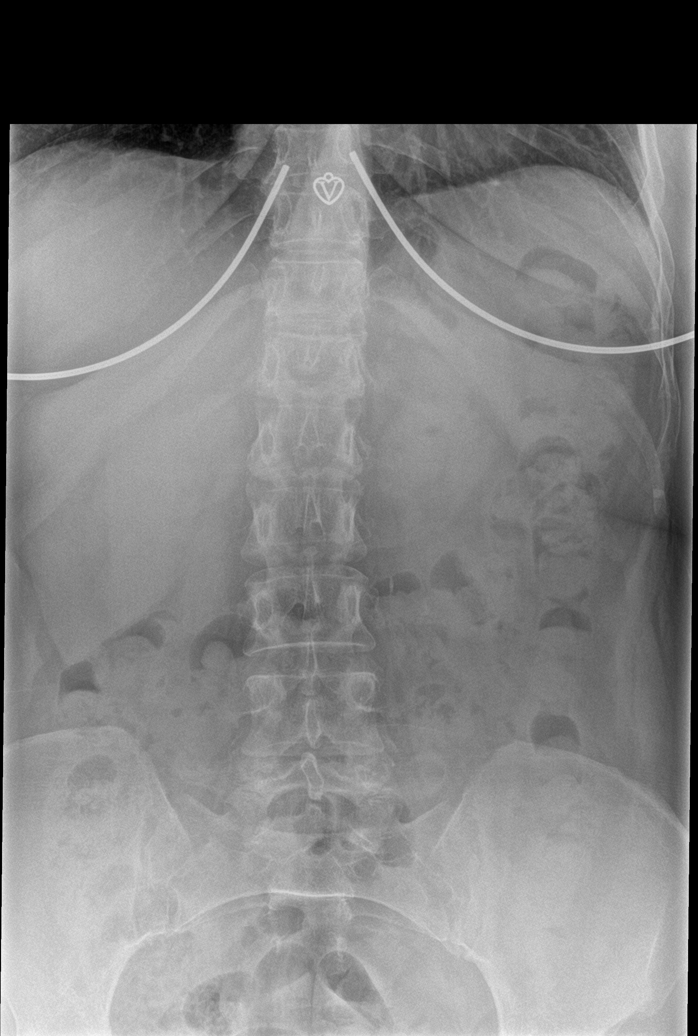

[l-spine obl (1 of 2)]
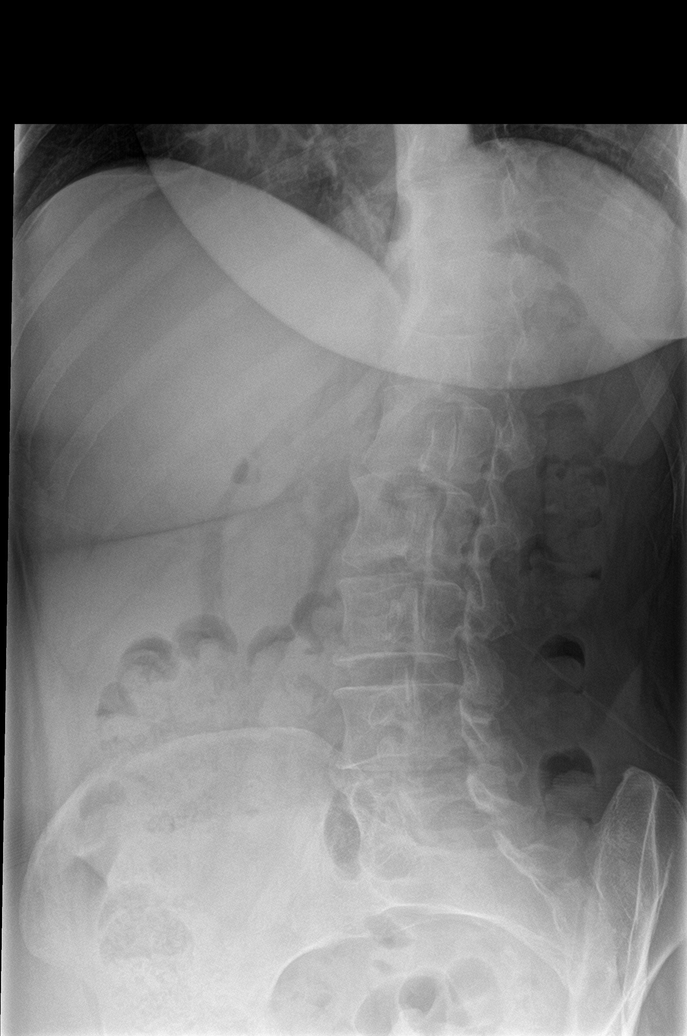

[l-spine obl (2 of 2)]
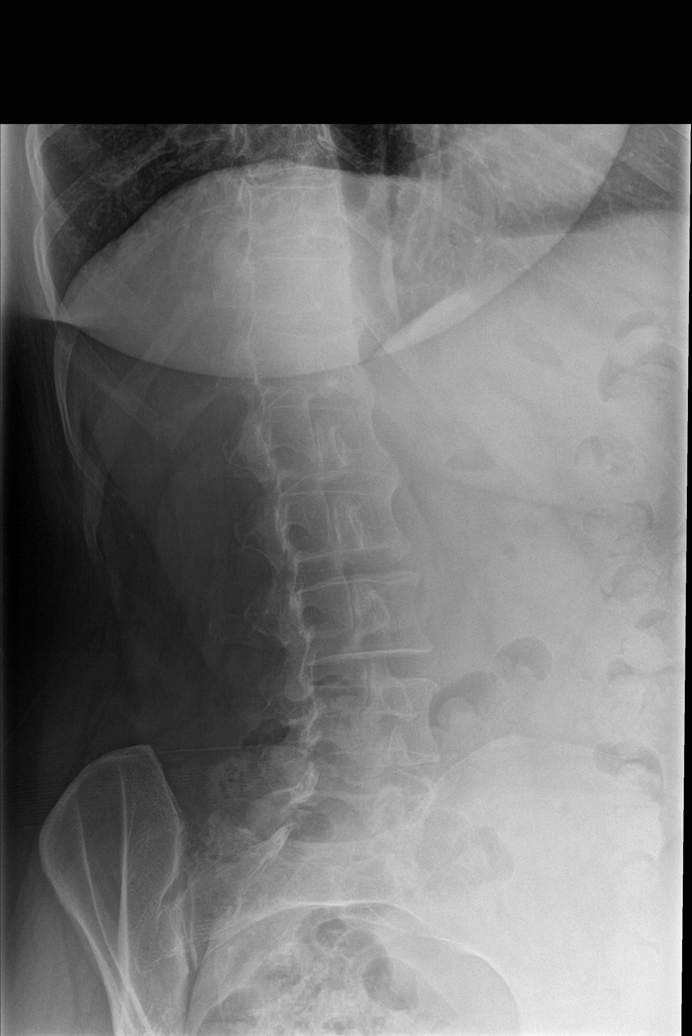

[l-spine lat]
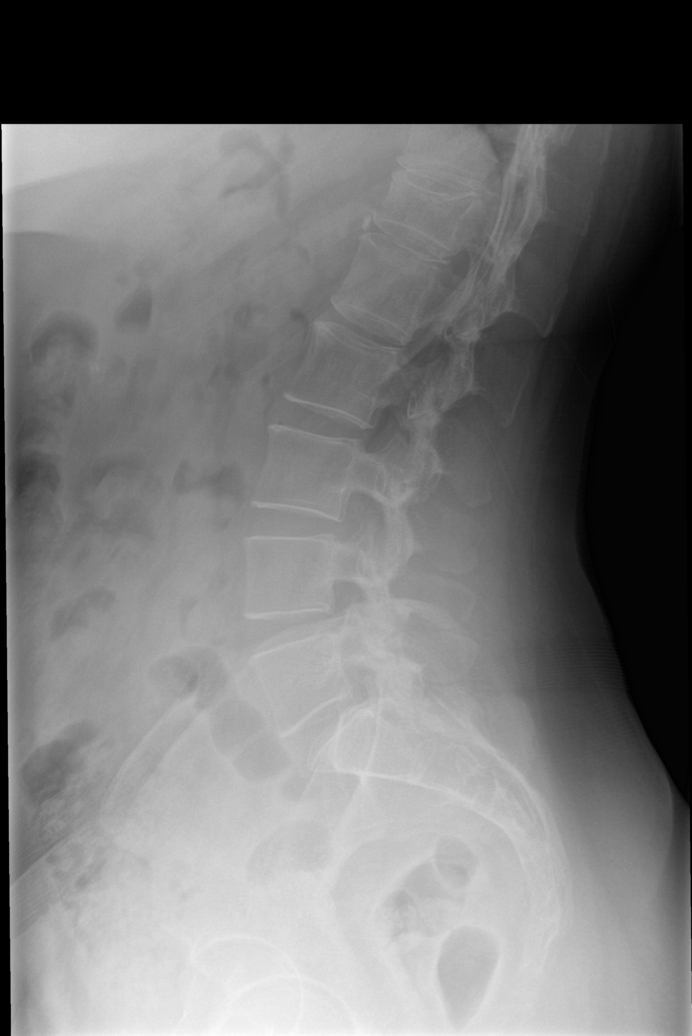

[l-spine spot]
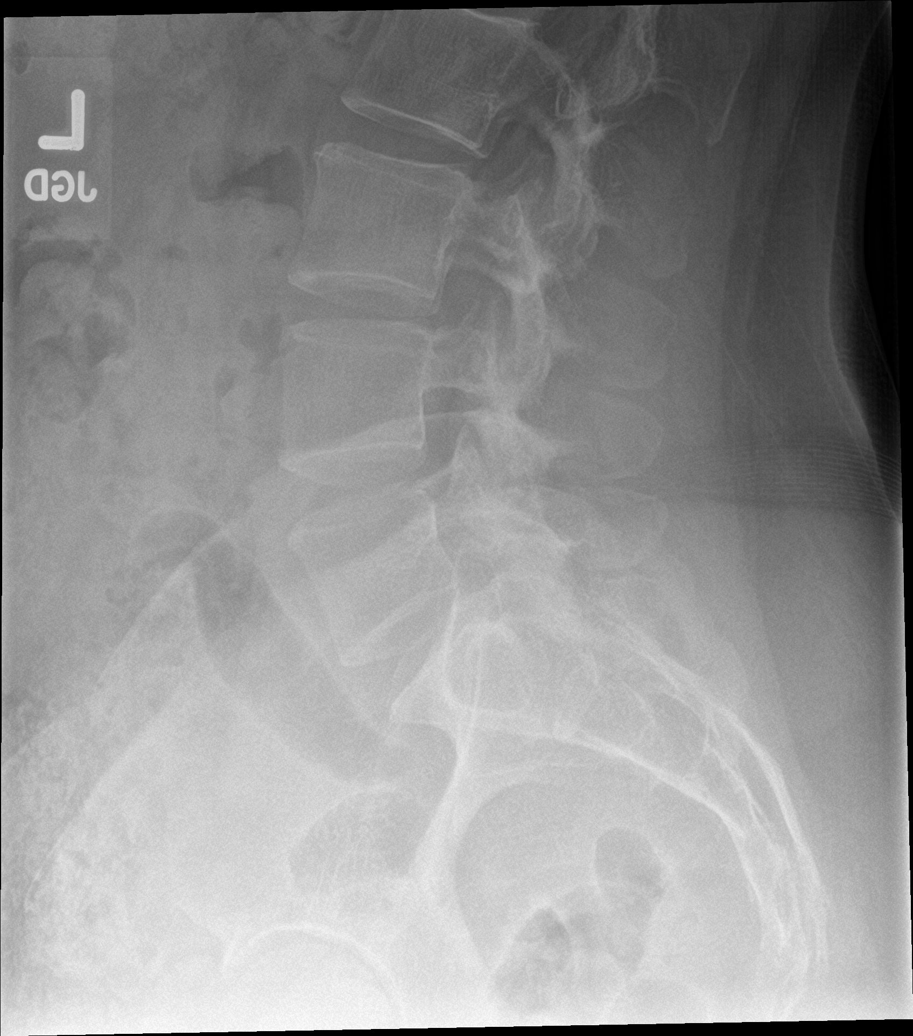

[5 of 5 positions shown; findings below may reference images not displayed]

FINDINGS: Hypoplastic ribs at T12. Lumbar alignment within normal limits.
Chronic superior endplate deformity at T12. Remaining vertebra
demonstrate normal stature. The disc spaces are patent.
IMPRESSION: Chronic superior endplate deformity at T12. No acute osseous
abnormality

## 2021-03-11 IMAGING — DX DG HIP (WITH OR WITHOUT PELVIS) 2-3V*R*
3 series · 3 of 3 positions shown · non-contrast
Comparison: None.

CLINICAL DATA: Right hip pain

EXAM:
DG HIP (WITH OR WITHOUT PELVIS) 2-3V RIGHT

[pelvis ap]
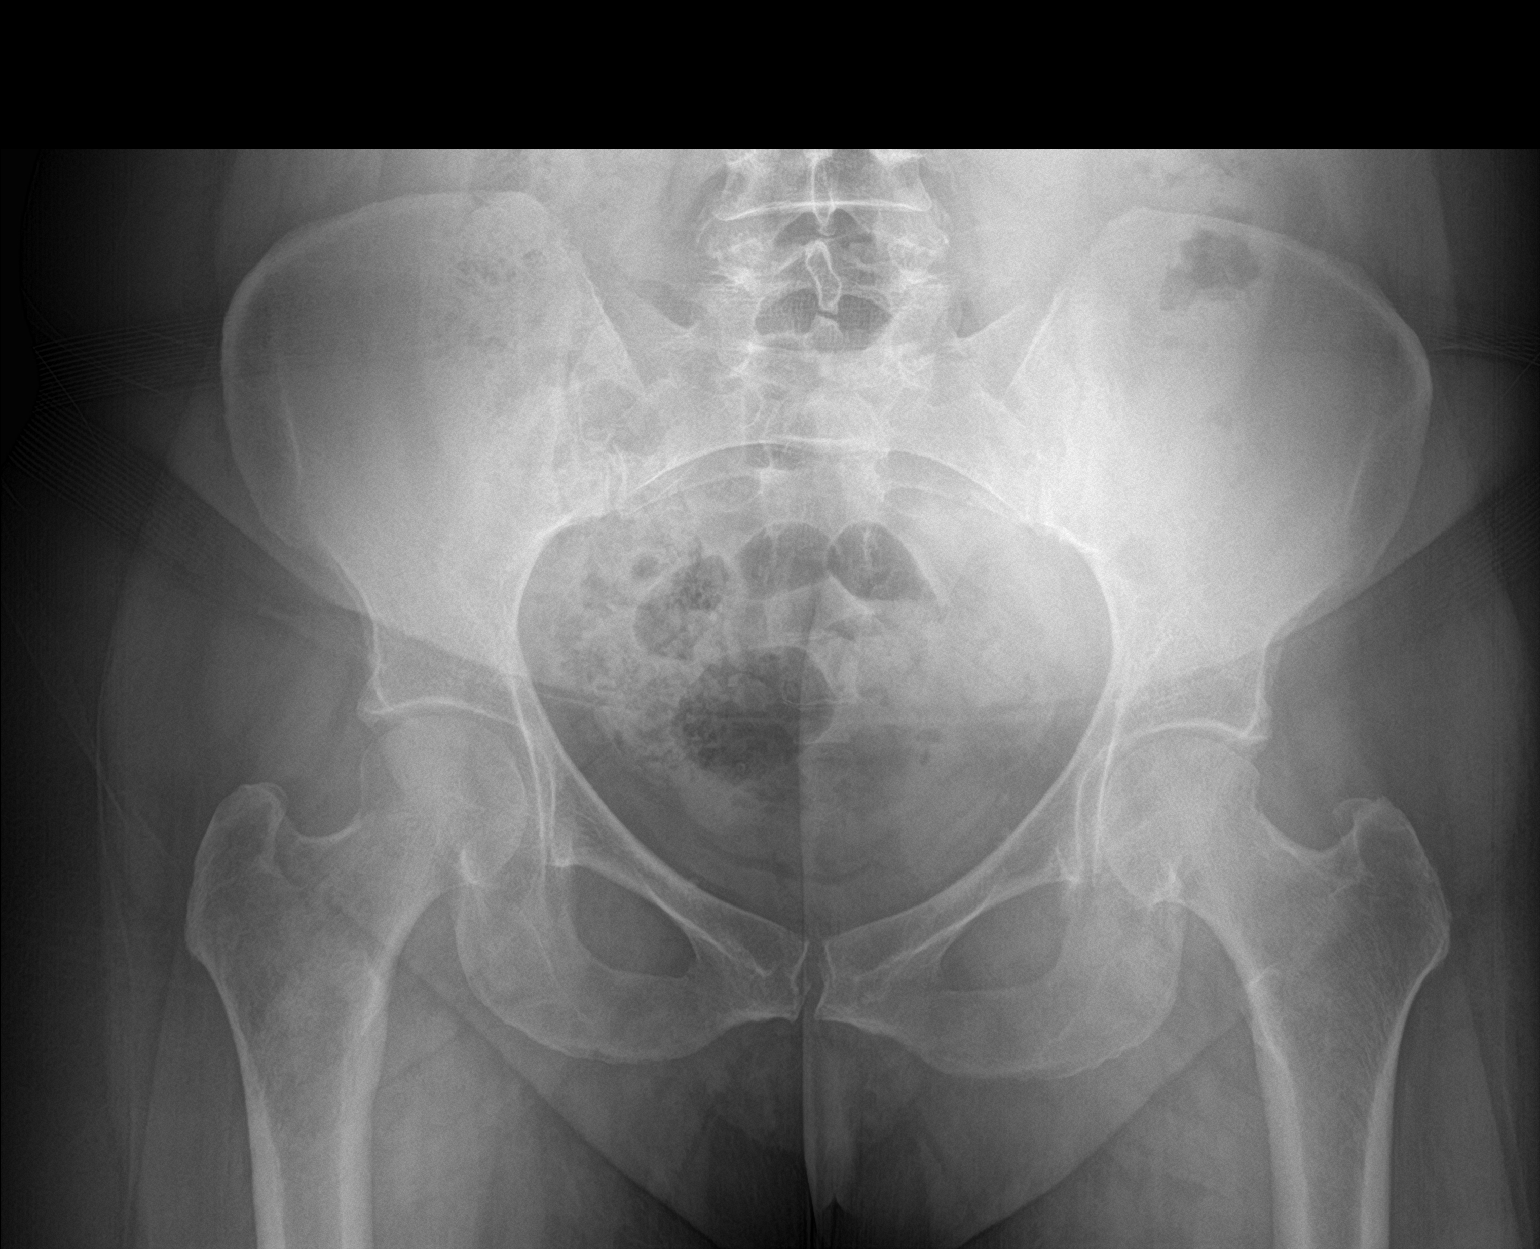

[hip ap]
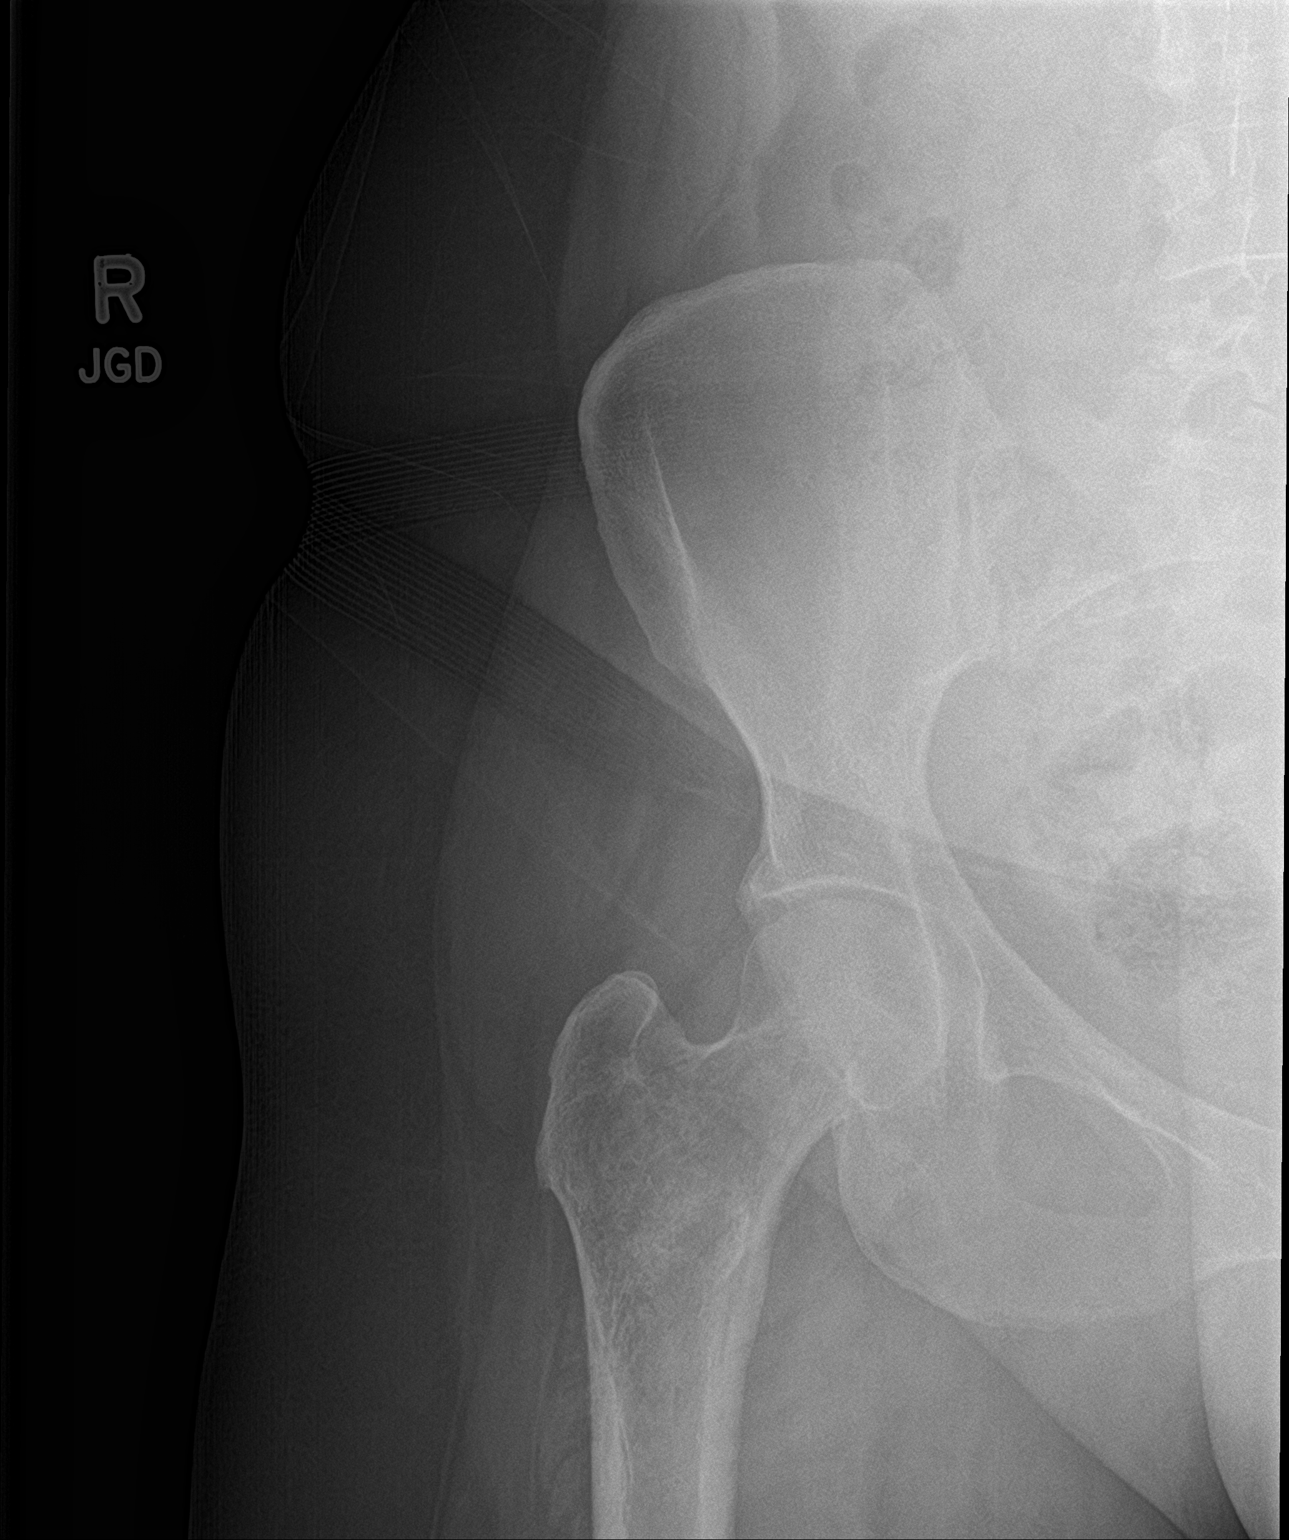

[hip lat]
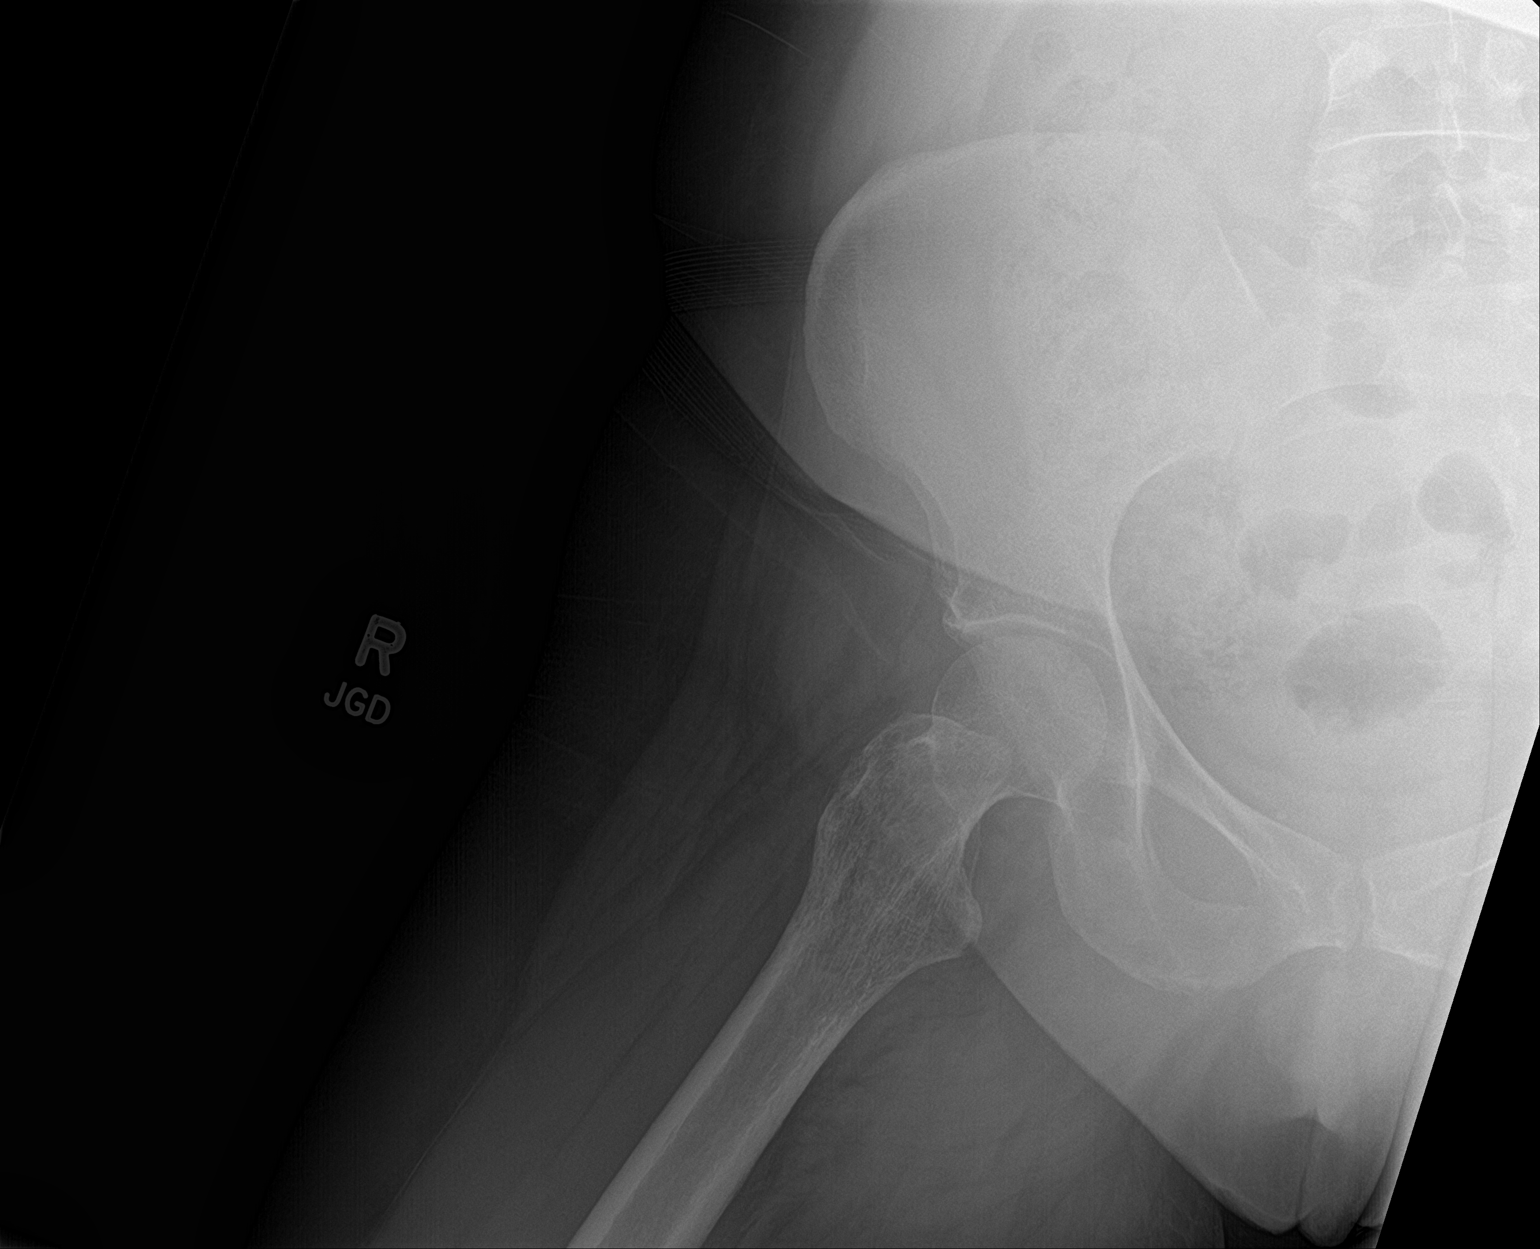

[3 of 3 positions shown; findings below may reference images not displayed]

FINDINGS: There is no evidence of hip fracture or dislocation. There is no
evidence of arthropathy or other focal bone abnormality.
IMPRESSION: Negative.

## 2021-03-11 MED ORDER — IBUPROFEN 800 MG PO TABS: 800.0000 mg | ORAL_TABLET | Freq: Three times a day (TID) | ORAL | 0 refills | Status: AC

## 2021-03-11 MED ORDER — CYCLOBENZAPRINE HCL 10 MG PO TABS: 10.0000 mg | ORAL_TABLET | Freq: Two times a day (BID) | ORAL | 0 refills | Status: AC | PRN

## 2021-03-11 NOTE — ED Notes (Signed)
Patient declined crutches.  Patient states she will use crutches at home

## 2021-03-11 NOTE — ED Provider Notes (Signed)
Natalie Griffin    CSN: [id number] Arrival date & time: 03/11/21  1806      History   Chief Complaint Chief Complaint  Patient presents with   Hip Pain    HPI Natalie Griffin is a 45 y.o. female.   45 year old female presents to urgent care with chief complaint of right hip pain for 2 weeks felt a pop when to take the trash out today and reinjured right hip low back.  Patient has taken 400 mg ibuprofen for relief, minimal.  Patient denies loss of bowel or bladder loss of saddle numbness  The history is provided by the patient. No language interpreter was used.  Hip Pain This is a new problem. The current episode started more than 1 week ago. The problem has not changed since onset.The symptoms are aggravated by walking, bending, twisting, stress, exertion and standing. The symptoms are relieved by rest. She has tried rest for the symptoms. The treatment provided mild relief.   Past Medical History:  Diagnosis Date   Anemia    Breast cancer ([place]) 2021   Left breast invasive ductal carcinoma   Cancer ([place]) 2021   Left breast   Family history of adverse reaction to anesthesia    Father and Aunt have pseudocholinesterase deficiency - Trouble waking up from anesthesia    Patient Active Problem List   Diagnosis Date Noted   Malignant neoplasm of upper-inner quadrant of left breast in female, estrogen receptor positive ([place]) 05/09/2020    Past Surgical History:  Procedure Laterality Date   BREAST LUMPECTOMY WITH RADIOACTIVE SEED AND SENTINEL LYMPH NODE BIOPSY Left 06/04/2020   Procedure: LEFT BREAST LUMPECTOMY WITH RADIOACTIVE SEED AND SENTINEL LYMPH NODE MAPPING;  Surgeon: Erroll Luna, MD;  Location: [facility];  Service: General;  Laterality: Left;  PEC BLOCK, RNFA    OB History   No obstetric history on file.      Home Medications    Prior to Admission medications   Medication Sig Start Date End Date Taking? Authorizing Provider  cyclobenzaprine (FLEXERIL) 10  MG tablet Take 1 tablet (10 mg total) by mouth 2 (two) times daily as needed for up to 5 days for muscle spasms. 03/11/21 03/04/42 Yes Oniya Mandarino, Jeanett Schlein, NP  ibuprofen (ADVIL) 800 MG tablet Take 1 tablet (800 mg total) by mouth 3 (three) times daily for 5 days. 03/11/21 1/54/00 Yes Feige Lowdermilk, Jeanett Schlein, NP  calcium carbonate (CALCIUM 600) 600 MG TABS tablet Take 600 mg by mouth daily with breakfast.    [provider]  Multiple Vitamins-Minerals (MULTIVITAMIN WITH MINERALS) tablet Take 1 tablet by mouth daily.    [provider]  Vitamin D, Cholecalciferol, 25 MCG (1000 UT) TABS Take 1 tablet by mouth daily. 12/02/20   Nicholas Lose, MD    Family History History reviewed. No pertinent family history.  Social History Social History   Tobacco Use   Smoking status: Former    Pack years: 0.00    Types: Cigarettes    Quit date: 05/19/2020    Years since quitting: 0.8   Smokeless tobacco: Never  Vaping Use   Vaping Use: Never used  Substance Use Topics   Alcohol use: Yes    Comment: social drinker   Drug use: Never     Allergies   Adhesive [tape], Tetanus toxoid, and Contrast media [iodinated diagnostic agents]   Review of Systems Review of Systems  Musculoskeletal:  Positive for arthralgias, gait problem and myalgias.  Skin:  Negative for color change.  All other systems reviewed and are negative.   Physical Exam Triage Vital Signs ED Triage Vitals  Enc Vitals Group     BP 03/11/21 1900 120/72     Pulse Rate 03/11/21 1900 87     Resp 03/11/21 1900 18     Temp 03/11/21 1900 98.6 F (37 C)     Temp Source 03/11/21 1900 Oral     SpO2 03/11/21 1900 98 %     Weight --      Height --      Head Circumference --      Peak Flow --      Pain Score 03/11/21 1853 7     Pain Loc --      Pain Edu? --      Excl. in [place]? --    No data found.  Updated Vital Signs BP 120/72 (BP Location: Right Arm)   Pulse 87   Temp 98.6 F (37 C) (Oral)   Resp 18   LMP  03/11/2021   SpO2 98%   Visual Acuity Right Eye Distance:   Left Eye Distance:   Bilateral Distance:    Right Eye Near:   Left Eye Near:    Bilateral Near:     Physical Exam Vitals and nursing note reviewed.  Constitutional:      Appearance: Normal appearance. She is well-developed.  Cardiovascular:     Rate and Rhythm: Normal rate and regular rhythm.     Pulses:          Dorsalis pedis pulses are 2+ on the right side and 2+ on the left side.  Musculoskeletal:     Right hip: Tenderness and bony tenderness present. No deformity, lacerations or crepitus. Decreased range of motion. Normal strength.       Legs:  Neurological:     Mental Status: She is alert.  Psychiatric:        Behavior: Behavior is cooperative.     UC Treatments / Results  Labs (all labs ordered are listed, but only abnormal results are displayed) Labs Reviewed - No data to display  EKG   Radiology DG Lumbar Spine Complete  Result Date: 03/11/2021 CLINICAL DATA:  Right hip pain EXAM: LUMBAR SPINE - COMPLETE 4+ VIEW COMPARISON:  CT chest 05/23/2020 FINDINGS: Hypoplastic ribs at T12. Lumbar alignment within normal limits. Chronic superior endplate deformity at Z61. Remaining vertebra demonstrate normal stature. The disc spaces are patent. IMPRESSION: Chronic superior endplate deformity at W96. No acute osseous abnormality Electronically Signed   By: Donavan Foil M.D.   On: 03/11/2021 19:24   DG Hip Unilat With Pelvis 2-3 Views Right  Result Date: 03/11/2021 CLINICAL DATA:  Right hip pain EXAM: DG HIP (WITH OR WITHOUT PELVIS) 2-3V RIGHT COMPARISON:  None. FINDINGS: There is no evidence of hip fracture or dislocation. There is no evidence of arthropathy or other focal bone abnormality. IMPRESSION: Negative. Electronically Signed   By: Donavan Foil M.D.   On: 03/11/2021 19:22    Procedures Procedures (including critical care time)  Medications Ordered in UC Medications - No data to display  Initial  Impression / Assessment and Plan / UC Course  I have reviewed the triage vital signs and the nursing notes.  Pertinent labs & imaging results that were available during my care of the patient were reviewed by me and considered in my medical decision making (see chart for details).     DDx: Labral tear, right hip strain, low back pain Final Clinical  Impressions(s) / UC Diagnoses   Final diagnoses:  Injury  Right hip pain  Acute low back pain without sciatica, unspecified back pain laterality     Discharge Instructions      Your x-rays are negative for acute findings .rest,ice,use crutches, take meds a sdirected. Call Ortho tomorrow for follow up appt. may need physical therapy and/or MRI.     ED Prescriptions     Medication Sig Dispense Auth. Provider   ibuprofen (ADVIL) 800 MG tablet Take 1 tablet (800 mg total) by mouth 3 (three) times daily for 5 days. 15 tablet Kenzlei Runions, NP   cyclobenzaprine (FLEXERIL) 10 MG tablet Take 1 tablet (10 mg total) by mouth 2 (two) times daily as needed for up to 5 days for muscle spasms. 10 tablet Gladstone Rosas, Jeanett Schlein, NP      PDMP not reviewed this encounter.   Tori Milks, NP 81/85/90 2010

## 2021-03-11 NOTE — ED Triage Notes (Signed)
2 weeks ago, did notice a pop in right hip.  Since then has had pain.  Today, was taking trash out.  Patient had right foot was planted, twisted body and has had significant pain since then.  Patient limped to treatment room.  Patient reports pain in anterior hip joint and straight through to area just below right buttocks.

## 2021-03-11 NOTE — Discharge Instructions (Addendum)
Your x-rays are negative for acute findings .rest,ice,use crutches, take meds a sdirected. Call Ortho tomorrow for follow up appt. may need physical therapy and/or MRI.

## 2021-03-11 NOTE — ED Notes (Signed)
Notified jeanette, np that patient declined crutches, has a set at home

## 2021-03-14 DIAGNOSIS — M25551 Pain in right hip: Secondary | ICD-10-CM | POA: Diagnosis not present

## 2021-03-17 ENCOUNTER — Other Ambulatory Visit: Payer: Self-pay | Admitting: *Deleted

## 2021-03-17 DIAGNOSIS — C50212 Malignant neoplasm of upper-inner quadrant of left female breast: Secondary | ICD-10-CM

## 2021-03-17 DIAGNOSIS — M25551 Pain in right hip: Secondary | ICD-10-CM | POA: Diagnosis not present

## 2021-03-17 DIAGNOSIS — Z170 Estrogen receptor positive status [ER+]: Secondary | ICD-10-CM

## 2021-03-17 NOTE — Progress Notes (Signed)
Per MD request, RN placed orders for CT CAP and bone scan. Apt will be scheduled once PA is obtained.

## 2021-03-18 ENCOUNTER — Other Ambulatory Visit: Payer: Self-pay | Admitting: Hematology and Oncology

## 2021-03-18 ENCOUNTER — Ambulatory Visit (HOSPITAL_COMMUNITY)
Admission: RE | Admit: 2021-03-18 | Discharge: 2021-03-18 | Disposition: A | Discharge status: Home / Self Care | Payer: 59 | Source: Ambulatory Visit | Attending: Hematology and Oncology | Admitting: Hematology and Oncology

## 2021-03-18 ENCOUNTER — Other Ambulatory Visit (HOSPITAL_COMMUNITY): Payer: 59

## 2021-03-18 ENCOUNTER — Telehealth: Payer: Self-pay | Admitting: *Deleted

## 2021-03-18 ENCOUNTER — Encounter: Payer: Self-pay | Admitting: General Practice

## 2021-03-18 ENCOUNTER — Other Ambulatory Visit: Payer: Self-pay

## 2021-03-18 DIAGNOSIS — Z170 Estrogen receptor positive status [ER+]: Secondary | ICD-10-CM | POA: Insufficient documentation

## 2021-03-18 DIAGNOSIS — C50919 Malignant neoplasm of unspecified site of unspecified female breast: Secondary | ICD-10-CM | POA: Diagnosis not present

## 2021-03-18 DIAGNOSIS — N6320 Unspecified lump in the left breast, unspecified quadrant: Secondary | ICD-10-CM | POA: Diagnosis not present

## 2021-03-18 DIAGNOSIS — C50212 Malignant neoplasm of upper-inner quadrant of left female breast: Secondary | ICD-10-CM | POA: Insufficient documentation

## 2021-03-18 DIAGNOSIS — M25551 Pain in right hip: Secondary | ICD-10-CM | POA: Diagnosis not present

## 2021-03-18 DIAGNOSIS — R911 Solitary pulmonary nodule: Secondary | ICD-10-CM | POA: Diagnosis not present

## 2021-03-18 DIAGNOSIS — C7951 Secondary malignant neoplasm of bone: Secondary | ICD-10-CM | POA: Diagnosis not present

## 2021-03-18 DIAGNOSIS — K760 Fatty (change of) liver, not elsewhere classified: Secondary | ICD-10-CM | POA: Diagnosis not present

## 2021-03-18 DIAGNOSIS — N200 Calculus of kidney: Secondary | ICD-10-CM | POA: Diagnosis not present

## 2021-03-18 IMAGING — CT CT CHEST-ABD-PELV W/O CM
2 of 4 series · 13 of 36 positions shown, 15 images · non-contrast
Comparison: CT [DATE]

CLINICAL DATA: Breast cancer restaging. Diagnosis 1 year prior.
RIGHT hip pain.

EXAM:
CT CHEST, ABDOMEN AND PELVIS WITHOUT CONTRAST
TECHNIQUE: Multidetector CT imaging of the chest, abdomen and pelvis was
performed following the standard protocol without IV contrast.

[Series 2: cap w/o · axial · non-contrast · 0.87mm/px · z∈[-462,+93]mm · 10 of 137 slices shown, 12 images]
[im 13/137  mediastinal]
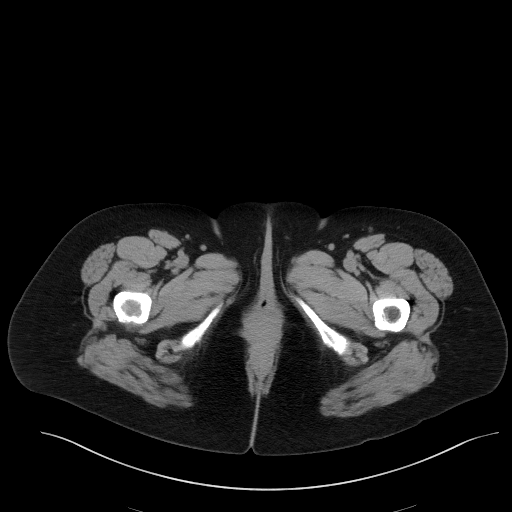
[im 13/137  bone]
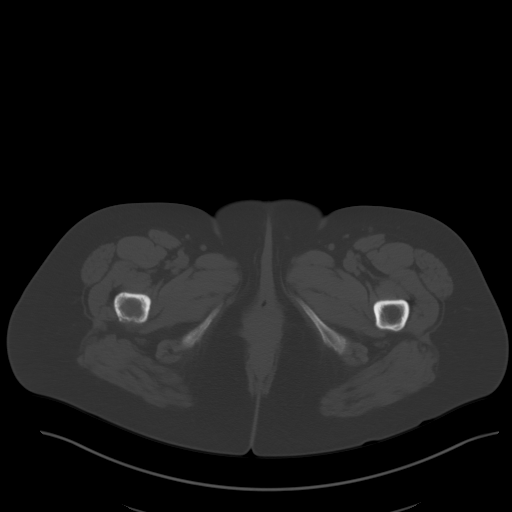
[im 25/137  mediastinal]
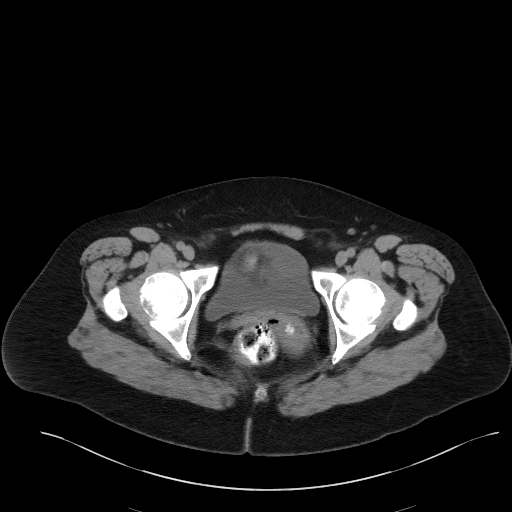
[im 38/137  mediastinal]
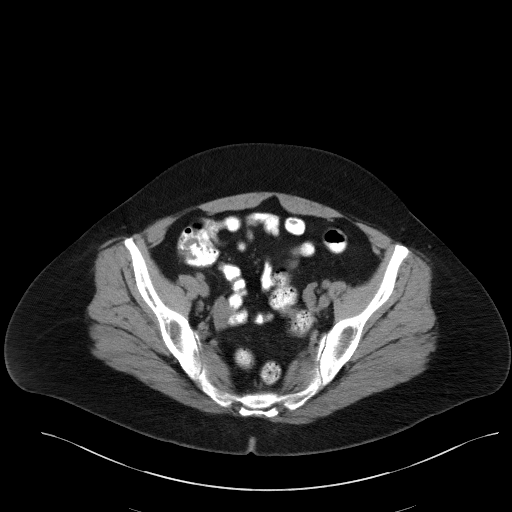
[im 50/137  mediastinal]
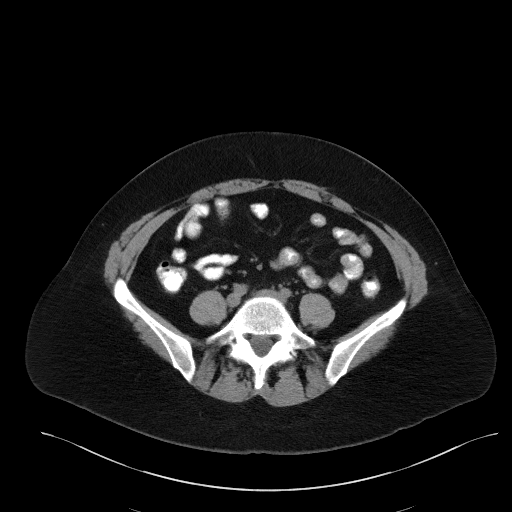
[im 62/137  mediastinal]
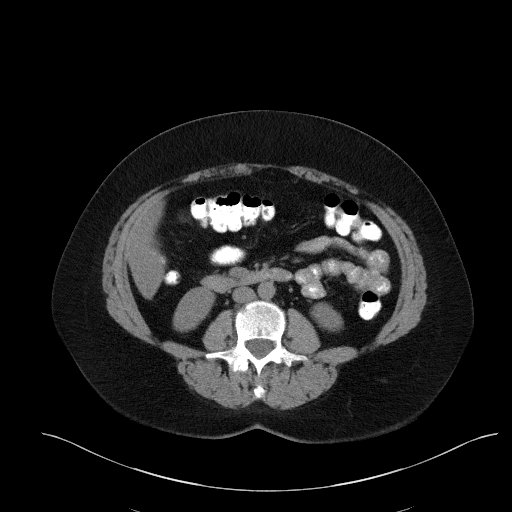
[im 75/137  mediastinal]
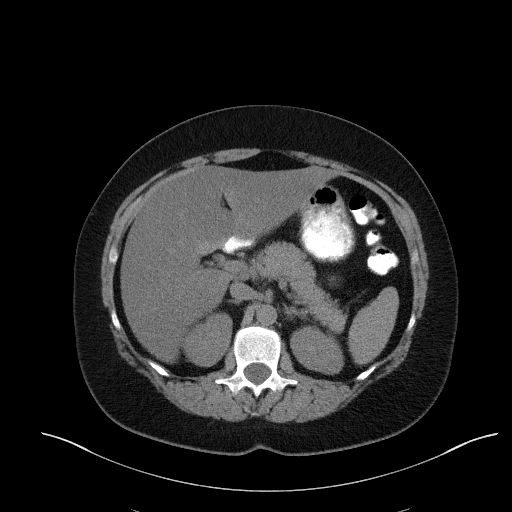
[im 87/137  mediastinal]
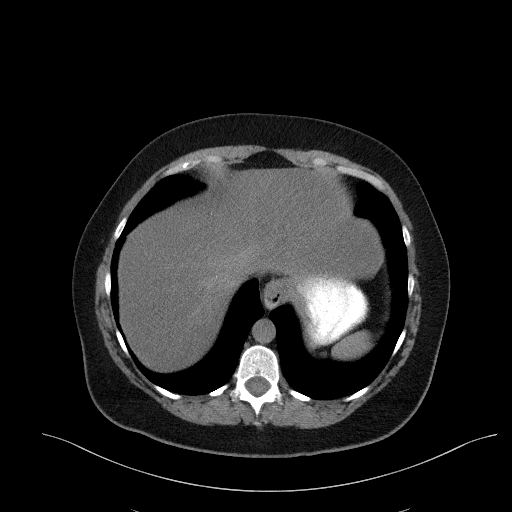
[im 99/137  mediastinal]
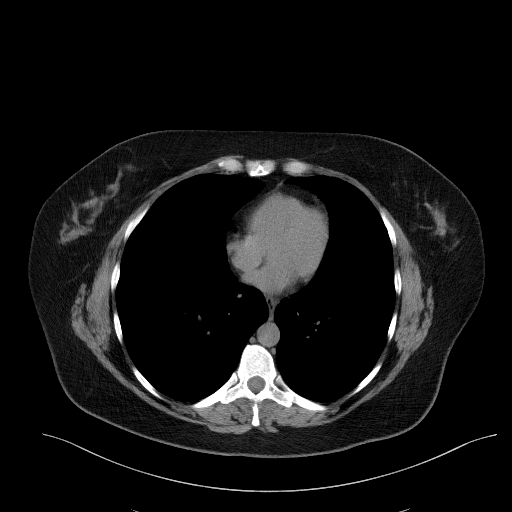
[im 112/137  mediastinal]
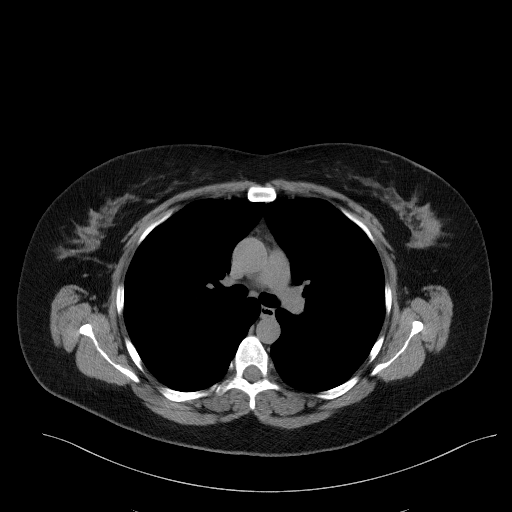
[im 112/137  bone]
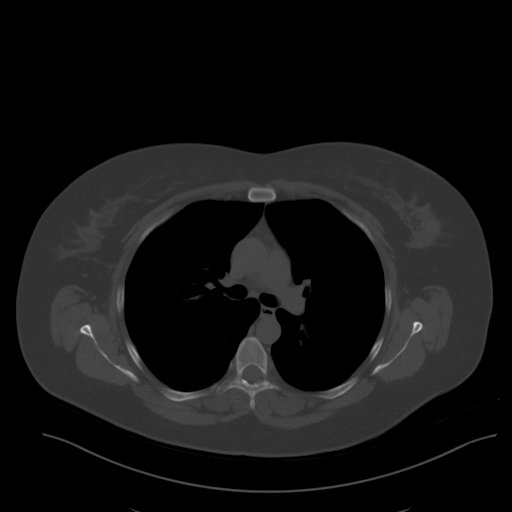
[im 124/137  mediastinal]
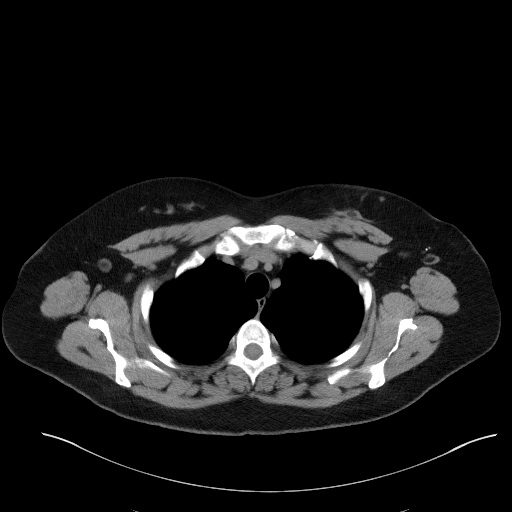

[Series 4: coronals · coronal · 0.76mm/px · 3 of 165 slices shown]
[im 33/165  mediastinal]
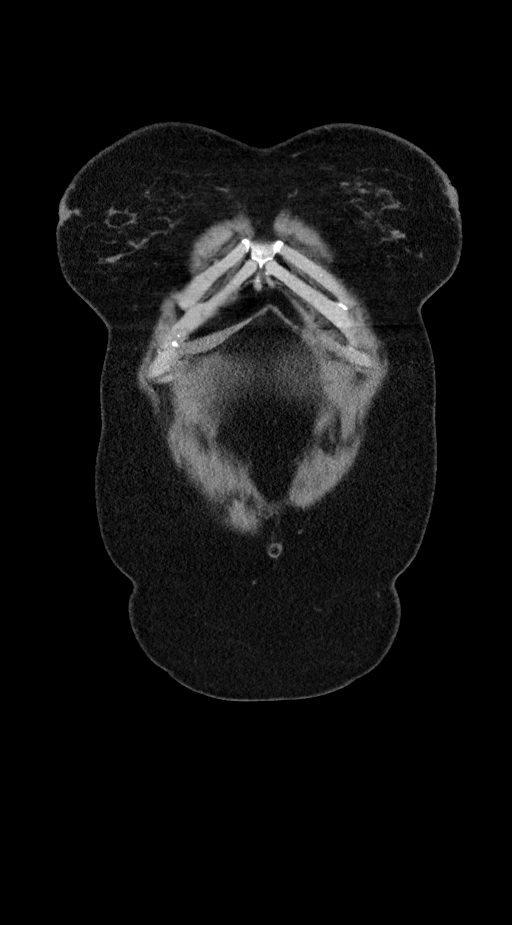
[im 66/165  mediastinal]
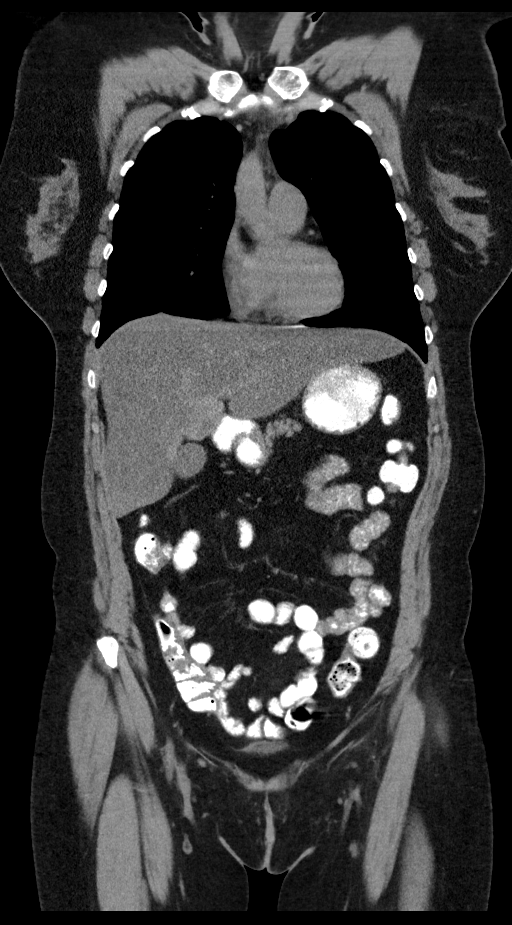
[im 99/165  mediastinal]
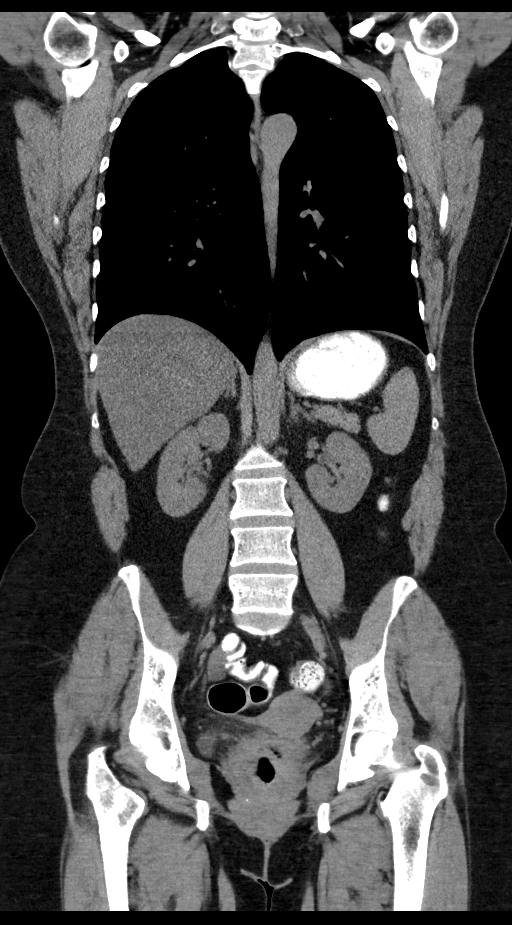

[13 of 36 positions shown; findings below may reference images not displayed]

FINDINGS: CT CHEST FINDINGS

Cardiovascular: No significant vascular findings. Normal heart size.
No pericardial effusion.

Mediastinum/Nodes: Lymphadenectomy clips in the LEFT axilla. No
axillary adenopathy. Supraclavicular internal mammary adenopathy. No
mediastinal hilar adenopathy.

Resection of medial LEFT breast mass

Lungs/Pleura: 3 mm RIGHT middle lobe nodule (image 84/series 6) is
unchanged. No new or suspicious pulmonary nodules. Tiny 1 mm nodule
on image 117 also unchanged

Musculoskeletal: No aggressive osseous lesion.

CT ABDOMEN AND PELVIS FINDINGS

Hepatobiliary: Low-attenuation liver consistent hepatic steatosis.
No focal hepatic lesion on noncontrast exam. Gallbladder normal.

Pancreas: Normal pancreas.

Spleen: Normal spleen

Adrenals/urinary tract: Adrenal glands normal. 1 mm nonobstructing
calculus in the LEFT kidney. Ureters and bladder normal.

Stomach/Bowel: Stomach, small-bowel and cecum are normal. The
appendix is not identified but there is no pericecal inflammation to
suggest appendicitis. The colon and rectosigmoid colon are normal.

Vascular/Lymphatic: Abdominal aorta is normal caliber. There is no
retroperitoneal or periportal lymphadenopathy. No pelvic
lymphadenopathy.

Reproductive: Uterus and adnexa unremarkable.

Other: No peritoneal metastasis.

Musculoskeletal: Insert cortical destruction of the RIGHT femoral
neck (image 115/2.) lucent lesion with cortical destruction in the
LEFT iliac bone measuring 16 mm image 92/2. Small lucent lesion in
the L3 vertebral body on image 76.
IMPRESSION: 1. Cortical destruction in the RIGHT femoral neck consistent with
skeletal metastasis. This lesion may be at risk for pathologic
fracture.
2. Lucent lesion in the LEFT iliac bone and L3 vertebral body
consistent with multifocal lytic skeletal metastasis.
3. No evidence of soft tissue metastasis, nodal metastasis, or
visceral metastasis.
4. Incidental findings of hepatic steatosis and nonobstructing renal
calculus.

These results will be called to the ordering clinician or
representative by the Radiologist Assistant, and communication
documented in the PACS or [REDACTED].

## 2021-03-18 MED ORDER — ALPRAZOLAM 0.25 MG PO TABS: 0.2500 mg | ORAL_TABLET | Freq: Every evening | ORAL | 3 refills | Status: DC | PRN

## 2021-03-18 NOTE — Progress Notes (Signed)
Patient Care Team: Pcp, No as PCP - General Rockwell Germany, RN as Oncology Nurse Navigator Mauro Kaufmann, RN as Oncology Nurse Navigator  DIAGNOSIS:    ICD-10-CM   1. Malignant neoplasm of upper-inner quadrant of left breast in female, estrogen receptor positive ([place])  C50.212    Z17.0       SUMMARY OF ONCOLOGIC HISTORY: Oncology History  Malignant neoplasm of upper-inner quadrant of left breast in female, estrogen receptor positive ([place])  04/11/2020 Initial Diagnosis   Patient palpated a left breast lump. Mammogram on 03/08/20 showed a 3.0cm mass at the 11 o'clock position with no axillary adenopathy. 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   05/09/2020 Cancer Staging   Staging form: Breast, AJCC 8th Edition - Clinical stage from 05/09/2020: Stage IB (cT2, cN0, cM0, G2, ER+, PR+, HER2+) - Signed by Nicholas Lose, MD on 05/09/2020    06/04/2020 Surgery   Left lumpectomy ([name]): invasive and in situ ductal carcinoma, 3.2cm, clear margins, one left axillary lymph node negative for carcinoma.    02/2021 Relapse/Recurrence   Patient went to orthopedics for right hip pain.  Imaging revealed bone metastasis.  CT CAP: Cortical destruction of the right femoral neck consistent with skeletal metastases at risk for pathologic fracture.  Lucent lesion in the left iliac bone and L3 vertebral body consistent with multifocal skeletal metastases.     CHIEF COMPLIANT: Newly diagnosed metastatic disease  INTERVAL HISTORY: Natalie Griffin is a 45 y.o. with above-mentioned history of left breast cancer who underwent a left lumpectomy. She did not receive adjuvant chemotherapy or radiation or antiestrogen therapy. She presented to orthopedics with complaints of hip pain and imaging studies revealed metastatic disease.  She underwent CT chest abdomen pelvis which showed bone metastases with cortical destruction and she  was sent to [place] for discussion regarding treatment options. ALLERGIES:  is allergic to adhesive [tape], tetanus toxoid, and contrast media [iodinated diagnostic agents].  MEDICATIONS:  Current Outpatient Medications  Medication Sig Dispense Refill   ALPRAZolam (XANAX) 0.25 MG tablet Take 1 tablet (0.25 mg total) by mouth at bedtime as needed for anxiety. 60 tablet 3   calcium carbonate (CALCIUM 600) 600 MG TABS tablet Take 600 mg by mouth daily with breakfast.     Multiple Vitamins-Minerals (MULTIVITAMIN WITH MINERALS) tablet Take 1 tablet by mouth daily.     Vitamin D, Cholecalciferol, 25 MCG (1000 UT) TABS Take 1 tablet by mouth daily. 60 tablet    No current facility-administered medications for this visit.    PHYSICAL EXAMINATION: ECOG PERFORMANCE STATUS: 1 - Symptomatic but completely ambulatory  Vitals:   03/19/21 1531  BP: 115/75  Pulse: 93  Resp: 18  Temp: 97.9 F (36.6 C)  SpO2: 100%   Filed Weights   03/19/21 1531  Weight: 188 lb 6.4 oz (85.5 kg)     LABORATORY DATA:  I have reviewed the data as listed CMP Latest Ref Rng & Units 05/29/2020  Glucose 70 - 99 mg/dL 108(H)  BUN 6 - 20 mg/dL 10  Creatinine 0.44 - 1.00 mg/dL 0.73  Sodium 135 - 145 mmol/L 136  Potassium 3.5 - 5.1 mmol/L 3.5  Chloride 98 - 111 mmol/L 103  CO2 22 - 32 mmol/L 24  Calcium 8.9 - 10.3 mg/dL 9.6  Total Protein 6.5 - 8.1 g/dL 7.1  Total Bilirubin 0.3 - 1.2 mg/dL 0.6  Alkaline Phos 38 - 126 U/L 53  AST 15 - 41 U/L 17  ALT 0 - 44 U/L 21    Lab Results  Component Value Date   WBC 13.5 (H) 05/29/2020   HGB 12.4 05/29/2020   HCT 39.3 05/29/2020   MCV 91.8 05/29/2020   PLT 335 05/29/2020   NEUTROABS 9.5 (H) 05/29/2020    ASSESSMENT & PLAN:  Malignant neoplasm of upper-inner quadrant of left breast in female, estrogen receptor positive ([place]) 04/11/2020:Patient palpated a left breast lump. Mammogram on 03/08/20 showed a 3.0cm mass at the 11 o'clock position with no axillary adenopathy.  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  Patient refused adjuvant chemo, adjuvant radiation and adjuvant antiestrogen therapy.  Patient went to orthopedics for right hip pain.  Imaging revealed bone metastasis.  CT CAP: Cortical destruction of the right femoral neck consistent with skeletal metastases at risk for pathologic fracture.  Lucent lesion in the left iliac bone and L3 vertebral body consistent with multifocal skeletal metastases. --------------------------------------------------------------- Treatment plan: 1.  I will discuss with Dr. Percell Miller regarding surgery for the right femoral neck for stabilization. 2. we will confirm the final pathology whether it is ER/PR positive and HER2 positive as before. 3.  Treatment plan: Start antiestrogen therapy with letrozole, may add Verzinio and Herceptin based on final path from the femur  Return to clinic after surgery to discuss final pathology.    No orders of the defined types were placed in this encounter.  The patient has a good understanding of the overall plan. she agrees with it. she will call with any problems that may develop before the next visit here.  Total time spent: 45 mins including face to face time and time spent for planning, charting and coordination of care  Rulon Eisenmenger, MD, MPH 03/19/2021  I, Reinaldo Raddle, am acting as scribe for Dr. Nicholas Lose, MD.  I have reviewed the above documentation for accuracy and completeness, and I agree with the above.

## 2021-03-18 NOTE — Progress Notes (Signed)
[facility] Spiritual Care Note  Referred by Merleen Nicely DeSota/RN for emotional support related to distress at bone metastasis. Reached [name] by phone, building rapport and providing reflective listening, emotional support, and normalization of feelings.   Natalie Griffin states that she was "raised native and has native beliefs" about death and dying. She notes, "I'm more comfortable with the possibility of dying than I am with the pain I might have to endure in getting there." She states that she is very clear about preferring quality of life over quantity. Natalie Griffin expressed concern that her young age may cause care providers to urge her to fight, even if aggressive care is not what she wants for herself. Reflected back her clarity and reminded her that the upcoming scans will give a fuller clinical picture to assist her in discerning how to proceed.  We plan to follow up in my office tomorrow after her appointment with Dr Lindi Adie so that we can further discuss her wishes, register her for [facility], process where she is emotionally, and explore a community-based counseling referral. Natalie Griffin also has my direct-dial number in case needs arise or she needs to postpone.   [place], [place], [facility] Pager [phone] Voicemail [phone]

## 2021-03-18 NOTE — Telephone Encounter (Signed)
Received call from pt with complaint of increase in anxiety and insomnia related to recent metastatic disease diagnosis.  Pt states she is experiencing a constant impeding doom sensation and is unable to sleep at night. RN with review with MD for management.

## 2021-03-19 ENCOUNTER — Encounter: Payer: Self-pay | Admitting: General Practice

## 2021-03-19 ENCOUNTER — Inpatient Hospital Stay: Payer: 59 | Attending: Hematology and Oncology | Admitting: Hematology and Oncology

## 2021-03-19 DIAGNOSIS — Z170 Estrogen receptor positive status [ER+]: Secondary | ICD-10-CM | POA: Insufficient documentation

## 2021-03-19 DIAGNOSIS — C50212 Malignant neoplasm of upper-inner quadrant of left female breast: Secondary | ICD-10-CM | POA: Diagnosis not present

## 2021-03-19 DIAGNOSIS — C7951 Secondary malignant neoplasm of bone: Secondary | ICD-10-CM | POA: Diagnosis not present

## 2021-03-19 MED ORDER — LETROZOLE 2.5 MG PO TABS: 2.5000 mg | ORAL_TABLET | Freq: Every day | ORAL | 3 refills | Status: DC

## 2021-03-19 NOTE — Assessment & Plan Note (Signed)
04/11/2020:Patient palpated a left breast lump. Mammogram on 03/08/20 showed a 3.0cm mass at the 11 o'clock position with no axillary adenopathy. 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 Patient refused adjuvant chemo, adjuvant radiation and adjuvant antiestrogen therapy.  Patient went to orthopedics for right hip pain.  Imaging revealed bone metastasis.  CT CAP: Cortical destruction of the right femoral neck consistent with skeletal metastases at risk for pathologic fracture.  Lucent lesion in the left iliac bone and L3 vertebral body consistent with multifocal skeletal metastases. --------------------------------------------------------------- Treatment plan: 1.  I will discussed with Dr. Percell Miller regarding surgery for the right femoral neck for stabilization. 2. we will confirm the final pathology whether it is ER/PR positive and HER2 positive as before. 3.  Treatment plan: Start antiestrogen therapy with letrozole and Verzenio, may add Herceptin if she is HER2 positive.   Return to clinic after surgery to discuss final pathology.

## 2021-03-19 NOTE — Progress Notes (Signed)
[facility] Spiritual Care Note  Followed up with Natalie Griffin in my office as planned. She was deeply relieved after her appointment with Dr Natalie Griffin, shifting her focus from mortality to present needs, including surgery and a new place to live by August. Her spirits were much lighter, and the encounter featured significant laughter.  Provided reflective listening, emotional support, normalization of feelings, and affirmation of strengths, as well as a prayer shawl, a packet of [place], a goody bag for self-soothing, and a packet of information about grants and financial resources from [place], whom Natalie Griffin knows to contact with questions.  Natalie Griffin plans to phone chaplain in the next couple of days to schedule herself for [facility] because she wishes to designate her sister healthcare power of attorney.   [place], [place], [facility] Pager [phone] Voicemail [phone]

## 2021-03-20 ENCOUNTER — Telehealth: Payer: Self-pay | Admitting: Hematology and Oncology

## 2021-03-20 NOTE — Telephone Encounter (Signed)
Scheduled per 6/29 los. Called and spoke with pt confirmed 7/7 and 8/3 appts

## 2021-03-21 NOTE — H&P (Signed)
PREOPERATIVE H&P  Chief Complaint: IMPENDING RIGHT HIP FRACTURE  HPI: Natalie Griffin is a 45 y.o. female who presents with a diagnosis of IMPENDING RIGHT HIP FRACTURE. She came into the office with right hip pain since early June. She felt a pop in her groin after yoga and worried she pulled a muscle. Xrays didn't show any obvious fracture of dislocation. Pain is now also in her back. Symptoms are rated as moderate to severe, and have been worsening. This is significantly impairing activities of daily living. MRI of the right hip showed multiple marrow replacing lesions involving the pelvis and right proximal femur consistent with metastatic disease. She has a history of breast cancer. After talking with her oncologist, he advised [place] that she is very near a pathologic fracture in the right proximal femur and he recommend we operate to stabilize it before it has the chance to actually fracture. We talked to the patient and she has elected for surgical management.   Past Medical History:  Diagnosis Date   Anemia    Breast cancer ([place]) 2021   Left breast invasive ductal carcinoma   Cancer ([place]) 2021   Left breast   Family history of adverse reaction to anesthesia    Father and Aunt have pseudocholinesterase deficiency - Trouble waking up from anesthesia   Past Surgical History:  Procedure Laterality Date   BREAST LUMPECTOMY WITH RADIOACTIVE SEED AND SENTINEL LYMPH NODE BIOPSY Left 06/04/2020   Procedure: LEFT BREAST LUMPECTOMY WITH RADIOACTIVE SEED AND SENTINEL LYMPH NODE MAPPING;  Surgeon: Erroll Luna, MD;  Location: [facility];  Service: General;  Laterality: Left;  PEC BLOCK, RNFA   Social History   Socioeconomic History   Marital status: Single    Spouse name: Not on file   Number of children: Not on file   Years of education: Not on file   Highest education level: Not on file  Occupational History   Not on file  Tobacco Use   Smoking status: Former    Pack years: 0.00    Types:  Cigarettes    Quit date: 05/19/2020    Years since quitting: 0.8   Smokeless tobacco: Never  Vaping Use   Vaping Use: Never used  Substance and Sexual Activity   Alcohol use: Yes    Comment: social drinker   Drug use: Never   Sexual activity: Not Currently  Other Topics Concern   Not on file  Social History Narrative   Not on file   Social Determinants of Health   Financial Resource Strain: Not on file  Food Insecurity: Not on file  Transportation Needs: Not on file  Physical Activity: Not on file  Stress: Not on file  Social Connections: Not on file   No family history on file. Allergies  Allergen Reactions   Adhesive [Tape] Other (See Comments)    Blistering with steri strips   Tetanus Toxoid Anaphylaxis   Contrast Media [Iodinated Diagnostic Agents] Rash    Rash/hives   Prior to Admission medications   Medication Sig Start Date End Date Taking? Authorizing Provider  ALPRAZolam (XANAX) 0.25 MG tablet Take 1 tablet (0.25 mg total) by mouth at bedtime as needed for anxiety. 03/18/21   Nicholas Lose, MD  calcium carbonate (CALCIUM 600) 600 MG TABS tablet Take 600 mg by mouth daily with breakfast.    [provider]  letrozole (FEMARA) 2.5 MG tablet Take 1 tablet (2.5 mg total) by mouth daily. 03/19/21   Nicholas Lose, MD  Multiple Vitamins-Minerals (  MULTIVITAMIN WITH MINERALS) tablet Take 1 tablet by mouth daily.    [provider]  Vitamin D, Cholecalciferol, 25 MCG (1000 UT) TABS Take 1 tablet by mouth daily. 12/02/20   Nicholas Lose, MD     Positive ROS: All other systems have been reviewed and were otherwise negative with the exception of those mentioned in the HPI and as above.  Physical Exam: General: Alert, no acute distress Cardiovascular: No pedal edema Respiratory: No cyanosis, no use of accessory musculature GI: No organomegaly, abdomen is soft and non-tender Skin: No lesions in the area of chief complaint Neurologic: Sensation intact  distally Psychiatric: Patient is competent for consent with normal mood and affect Lymphatic: No axillary or cervical lymphadenopathy  MUSCULOSKELETAL: TTP anterior right hip. Pain with resisted hip flexion. Difficulty doing a single leg stance. No significant pain with internal or external rotation.  Assessment: IMPENDING RIGHT HIP FRACTURE  Plan: Plan for Procedure(s): INTRAMEDULLARY (IM) NAIL FEMORAL RIGHT  The risks benefits and alternatives were discussed with the patient including but not limited to the risks of nonoperative treatment, versus surgical intervention including infection, bleeding, nerve injury,  blood clots, cardiopulmonary complications, morbidity, mortality, among others, and they were willing to proceed.   She may need to spend the night in observation.   Weightbearing: WBAT RLE Orthopedic devices: none Showering: POD 3 Dressing: Reinforce as needed Medicines: ASA, Oxy, Tylenol, Mobic, Robaxin, Zofran, Omeprazole  Discharge: home Follow up: 1 week post-op    Alisa Graff Office [phone] 03/21/2021 5:43 PM

## 2021-03-25 ENCOUNTER — Other Ambulatory Visit: Payer: Self-pay

## 2021-03-25 ENCOUNTER — Encounter (HOSPITAL_COMMUNITY): Payer: Self-pay | Admitting: Orthopedic Surgery

## 2021-03-25 NOTE — Progress Notes (Signed)
COVID Vaccine Completed: No Date COVID Vaccine completed: N/A Has received booster: N/A COVID vaccine manufacturer:  N/A Date of COVID positive in last 90 days: No  PCP - N/A Cardiologist - N/A Oncologist-Natalie Loyal Gambler, MD, MPH last office visit 03/19/2021 in epic  Chest CT 03/18/2021 in epic EKG - N/A Stress Test - N/A ECHO - N/A Cardiac Cath - N/A Pacemaker/ICD device last checked: N/A  Sleep Study - N/A CPAP - N/A  Fasting Blood Sugar - N/A Checks Blood Sugar ___N/A__ times a day  Blood Thinner Instructions: N/A Aspirin Instructions: N/A Last Dose: N/A  Activity level:  Can go up a flight of stairs and activities of daily living without stopping and without symptoms    Anesthesia review: history of left breast cancer with bone metastasis  Patient denies shortness of breath, fever, cough and chest pain at PAT appointment   Patient verbalized understanding of instructions that were given to them at the PAT appointment. Patient was also instructed that they will need to review over the PAT instructions again at home before surgery.

## 2021-03-26 ENCOUNTER — Encounter (HOSPITAL_COMMUNITY)
Admission: RE | Admit: 2021-03-26 | Discharge: 2021-03-26 | Disposition: A | Discharge status: Home / Self Care | Payer: 59 | Source: Ambulatory Visit | Attending: Hematology and Oncology | Admitting: Hematology and Oncology

## 2021-03-26 ENCOUNTER — Emergency Department (HOSPITAL_COMMUNITY): Payer: 59

## 2021-03-26 ENCOUNTER — Ambulatory Visit (HOSPITAL_COMMUNITY)
Admission: RE | Admit: 2021-03-26 | Discharge: 2021-03-26 | Disposition: A | Discharge status: Home / Self Care | Payer: 59 | Source: Ambulatory Visit | Attending: Hematology and Oncology | Admitting: Hematology and Oncology

## 2021-03-26 ENCOUNTER — Encounter (HOSPITAL_COMMUNITY): Payer: Self-pay | Admitting: Emergency Medicine

## 2021-03-26 ENCOUNTER — Emergency Department (HOSPITAL_COMMUNITY)
Admission: EM | Admit: 2021-03-26 | Discharge: 2021-03-26 | Disposition: A | Discharge status: Home / Self Care | Payer: 59 | Attending: Emergency Medicine | Admitting: Emergency Medicine

## 2021-03-26 ENCOUNTER — Other Ambulatory Visit (HOSPITAL_COMMUNITY): Payer: 59

## 2021-03-26 DIAGNOSIS — Z87891 Personal history of nicotine dependence: Secondary | ICD-10-CM | POA: Diagnosis not present

## 2021-03-26 DIAGNOSIS — Z85830 Personal history of malignant neoplasm of bone: Secondary | ICD-10-CM | POA: Insufficient documentation

## 2021-03-26 DIAGNOSIS — Z170 Estrogen receptor positive status [ER+]: Secondary | ICD-10-CM

## 2021-03-26 DIAGNOSIS — R519 Headache, unspecified: Secondary | ICD-10-CM | POA: Diagnosis not present

## 2021-03-26 DIAGNOSIS — C50212 Malignant neoplasm of upper-inner quadrant of left female breast: Secondary | ICD-10-CM | POA: Insufficient documentation

## 2021-03-26 DIAGNOSIS — M25551 Pain in right hip: Secondary | ICD-10-CM | POA: Diagnosis not present

## 2021-03-26 DIAGNOSIS — H532 Diplopia: Secondary | ICD-10-CM | POA: Diagnosis not present

## 2021-03-26 DIAGNOSIS — C50919 Malignant neoplasm of unspecified site of unspecified female breast: Secondary | ICD-10-CM | POA: Diagnosis not present

## 2021-03-26 DIAGNOSIS — Z853 Personal history of malignant neoplasm of breast: Secondary | ICD-10-CM | POA: Insufficient documentation

## 2021-03-26 DIAGNOSIS — M79651 Pain in right thigh: Secondary | ICD-10-CM | POA: Diagnosis not present

## 2021-03-26 DIAGNOSIS — M542 Cervicalgia: Secondary | ICD-10-CM | POA: Diagnosis not present

## 2021-03-26 DIAGNOSIS — C7951 Secondary malignant neoplasm of bone: Secondary | ICD-10-CM | POA: Diagnosis not present

## 2021-03-26 LAB — CBC WITH DIFFERENTIAL/PLATELET
Abs Immature Granulocytes: 0.1 10*3/uL — ABNORMAL HIGH (ref 0.00–0.07)
Basophils Absolute: 0.1 10*3/uL (ref 0.0–0.1)
Basophils Relative: 0 %
Eosinophils Absolute: 0.2 10*3/uL (ref 0.0–0.5)
Eosinophils Relative: 1 %
HCT: 38 % (ref 36.0–46.0)
Hemoglobin: 12.4 g/dL (ref 12.0–15.0)
Immature Granulocytes: 1 %
Lymphocytes Relative: 23 %
Lymphs Abs: 3.6 10*3/uL (ref 0.7–4.0)
MCH: 29.3 pg (ref 26.0–34.0)
MCHC: 32.6 g/dL (ref 30.0–36.0)
MCV: 89.8 fL (ref 80.0–100.0)
Monocytes Absolute: 1 10*3/uL (ref 0.1–1.0)
Monocytes Relative: 6 %
Neutro Abs: 10.9 10*3/uL — ABNORMAL HIGH (ref 1.7–7.7)
Neutrophils Relative %: 69 %
Platelets: 335 10*3/uL (ref 150–400)
RBC: 4.23 MIL/uL (ref 3.87–5.11)
RDW: 13.3 % (ref 11.5–15.5)
WBC: 15.8 10*3/uL — ABNORMAL HIGH (ref 4.0–10.5)
nRBC: 0 % (ref 0.0–0.2)

## 2021-03-26 LAB — COMPREHENSIVE METABOLIC PANEL
ALT: 16 U/L (ref 0–44)
AST: 18 U/L (ref 15–41)
Albumin: 4.3 g/dL (ref 3.5–5.0)
Alkaline Phosphatase: 67 U/L (ref 38–126)
Anion gap: 12 (ref 5–15)
BUN: 13 mg/dL (ref 6–20)
CO2: 22 mmol/L (ref 22–32)
Calcium: 9.5 mg/dL (ref 8.9–10.3)
Chloride: 102 mmol/L (ref 98–111)
Creatinine, Ser: 0.8 mg/dL (ref 0.44–1.00)
GFR, Estimated: >60 mL/min (ref >60)
Glucose, Bld: 95 mg/dL (ref 70–99)
Potassium: 3.5 mmol/L (ref 3.5–5.1)
Sodium: 136 mmol/L (ref 135–145)
Total Bilirubin: 0.5 mg/dL (ref 0.3–1.2)
Total Protein: 7.9 g/dL (ref 6.5–8.1)

## 2021-03-26 LAB — I-STAT BETA HCG BLOOD, ED (MC, WL, AP ONLY): I-stat hCG, quantitative: <5 m[IU]/mL (ref <5)

## 2021-03-26 IMAGING — MR MR MRA HEAD W/O CM
2 series · 21 of 48 positions shown · non-contrast
Comparison: No pertinent prior exam.

CLINICAL DATA: History of breast cancer.  Concern for brain mass.

EXAM:
MRI HEAD WITHOUT CONTRAST
MRA HEAD WITHOUT CONTRAST
TECHNIQUE: Multiplanar, multi-echo pulse sequences of the brain and surrounding
structures were acquired without intravenous contrast. Angiographic
images of the Circle of Willis were acquired using MRA technique
without intravenous contrast.

[Series 5: vessel_scout_head_msum · sagittal · 6.0mm · 0.59mm/px · 6 of 24 slices shown]
[im 1/24]
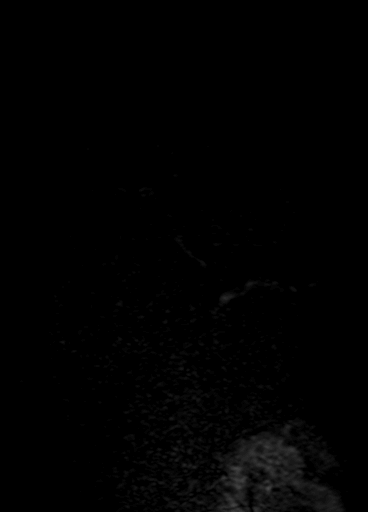
[im 5/24]
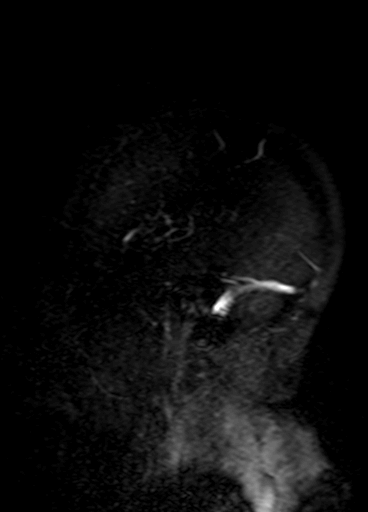
[im 10/24]
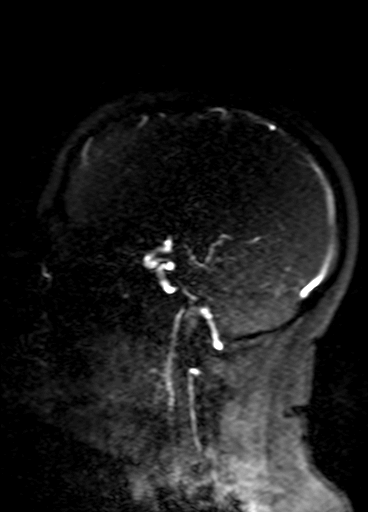
[im 14/24]
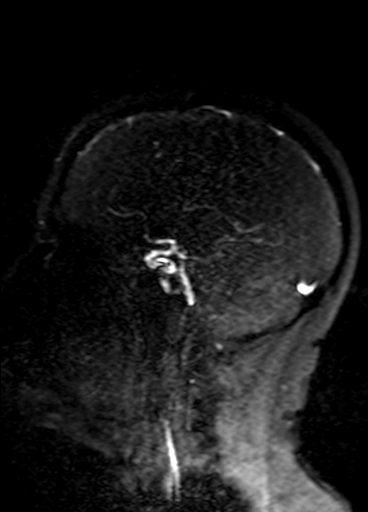
[im 19/24]
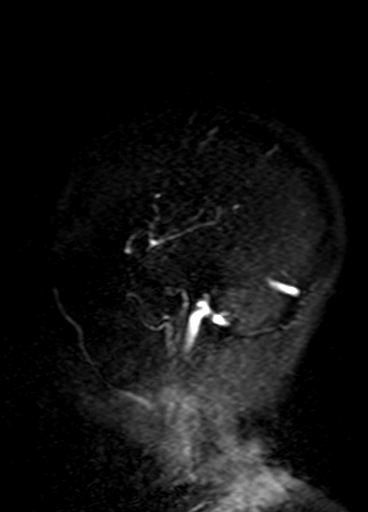
[im 24/24]
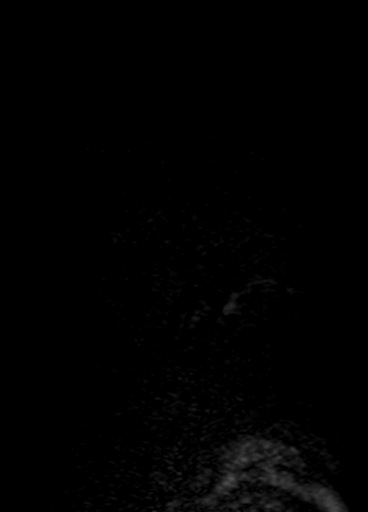

[Series 8: TOF · axial · 0.6mm · 0.35mm/px · z∈[-71,+26]mm · 15 of 172 slices shown]
[im 1/172]
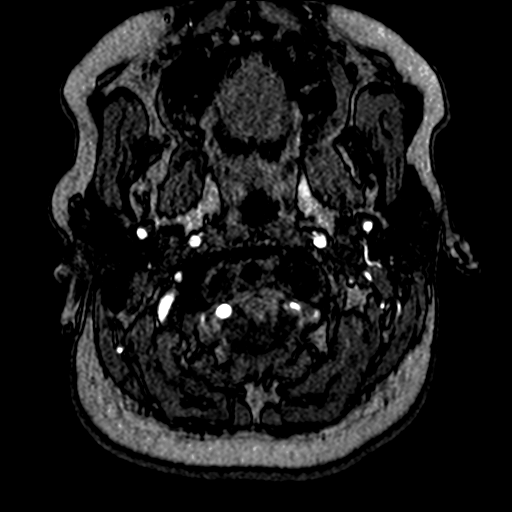
[im 5/172]
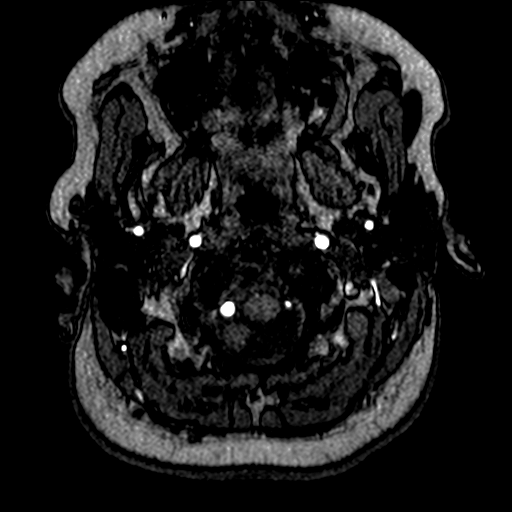
[im 9/172]
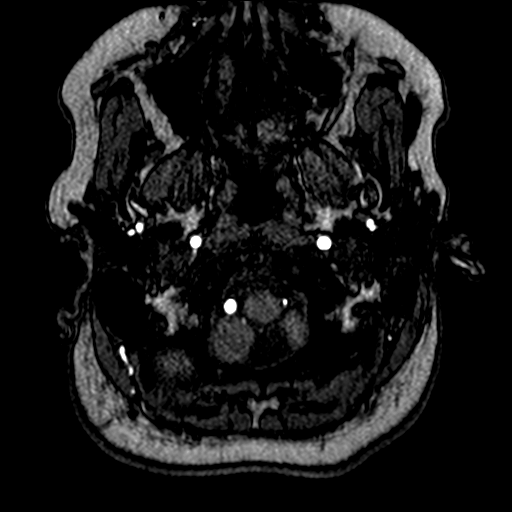
[im 13/172]
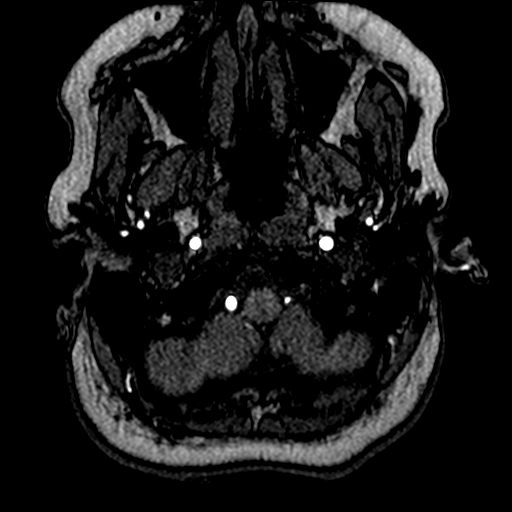
[im 17/172]
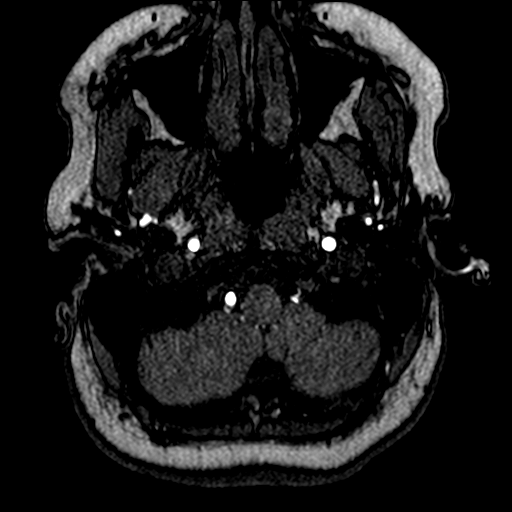
[im 21/172]
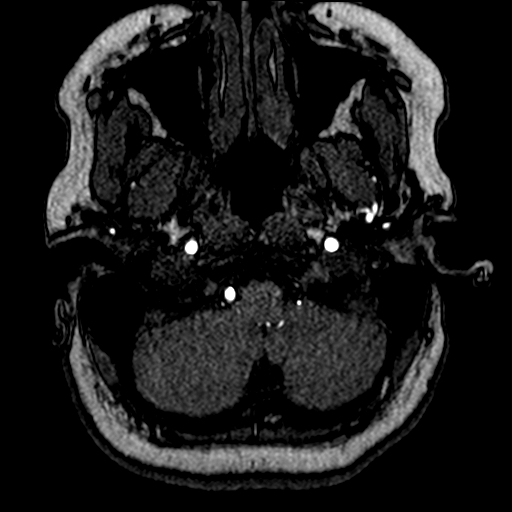
[im 30/172]
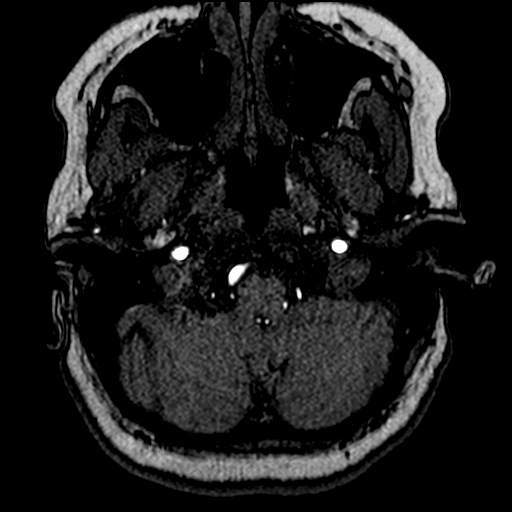
[im 55/172]
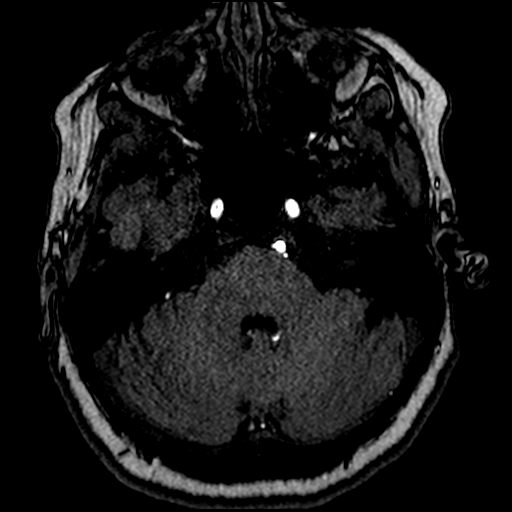
[im 76/172]
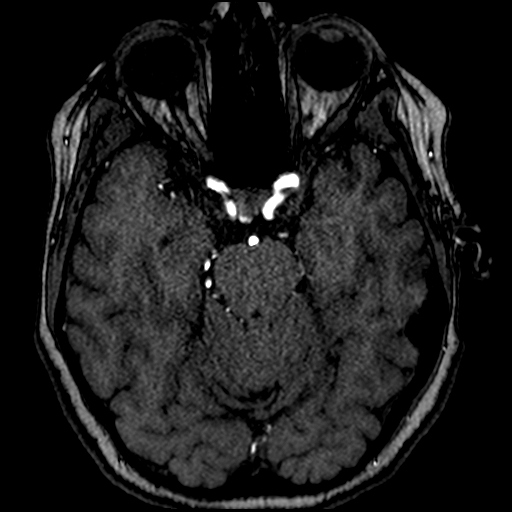
[im 88/172]
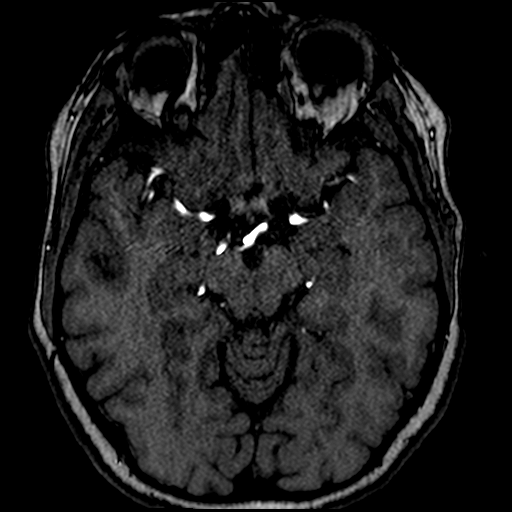
[im 96/172]
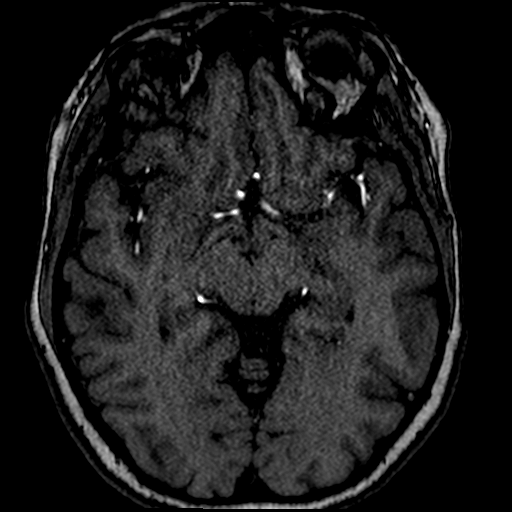
[im 117/172]
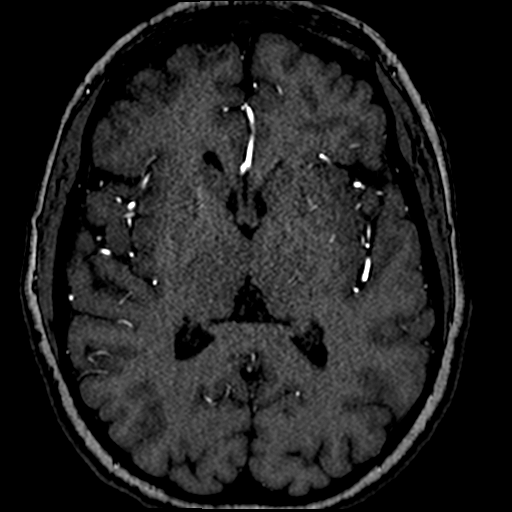
[im 142/172]
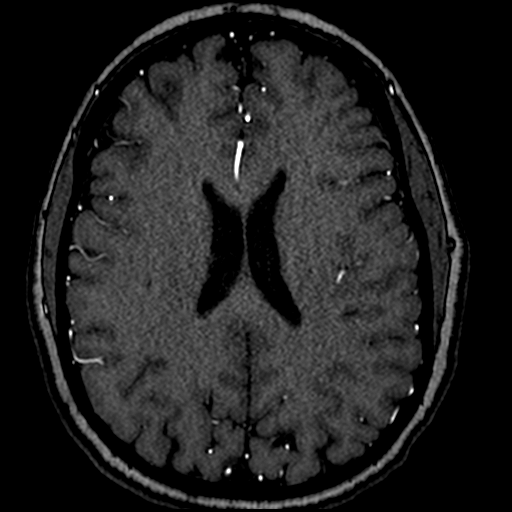
[im 146/172]
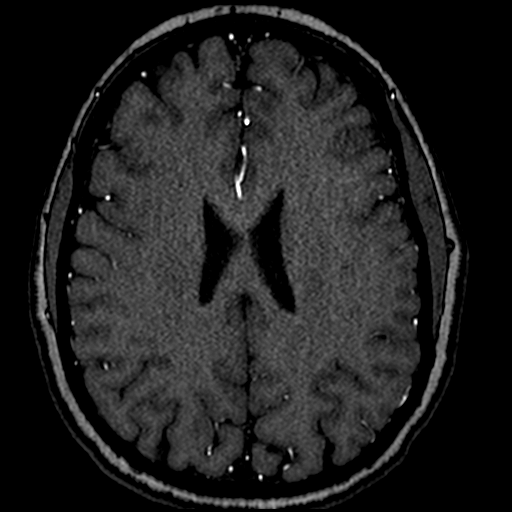
[im 163/172]
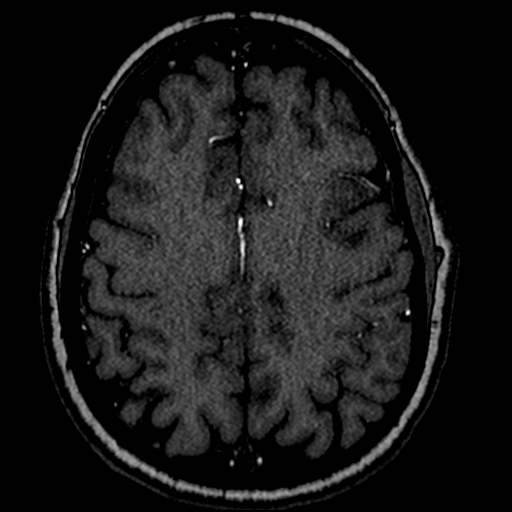

[21 of 48 positions shown; findings below may reference images not displayed]

FINDINGS: MRI HEAD FINDINGS

Brain: No acute infarct, mass effect or extra-axial collection. No
acute or chronic hemorrhage. Normal white matter signal, parenchymal
volume and CSF spaces. The midline structures are normal.

Vascular: Major flow voids are preserved.

Skull and upper cervical spine: Normal calvarium and skull base.
Visualized upper cervical spine and soft tissues are normal.

Sinuses/Orbits:No paranasal sinus fluid levels or advanced mucosal
thickening. No mastoid or middle ear effusion. Normal orbits.

MRA HEAD FINDINGS

POSTERIOR CIRCULATION:

--Vertebral arteries: Normal

--Inferior cerebellar arteries: Normal.

--Basilar artery: Normal.

--Superior cerebellar arteries: Normal.

--Posterior cerebral arteries: Normal.

ANTERIOR CIRCULATION:

--Intracranial internal carotid arteries: Normal.

--Anterior cerebral arteries (ACA): Normal.

--Middle cerebral arteries (MCA): Normal.

ANATOMIC VARIANTS: None
IMPRESSION: Normal MRI/MRA of the brain. No findings of metastatic disease on
this noncontrast examination.

## 2021-03-26 IMAGING — MR MR HEAD W/O CM
11 series · 48 of 48 positions shown · non-contrast
Comparison: No pertinent prior exam.

CLINICAL DATA: History of breast cancer.  Concern for brain mass.

EXAM:
MRI HEAD WITHOUT CONTRAST
MRA HEAD WITHOUT CONTRAST
TECHNIQUE: Multiplanar, multi-echo pulse sequences of the brain and surrounding
structures were acquired without intravenous contrast. Angiographic
images of the Circle of Willis were acquired using MRA technique
without intravenous contrast.

[Series 5: DWI · axial · 3.0mm · 1.36mm/px · z∈[-58,+87]mm · 7 of 100 slices shown (1 of 2)]
[im 1/100]
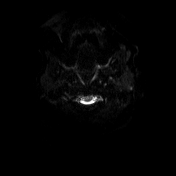
[im 17/100]
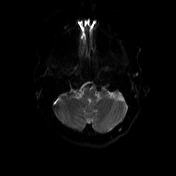
[im 34/100]
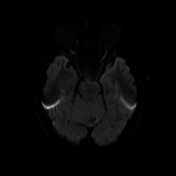
[im 50/100]
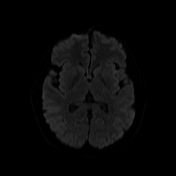
[im 67/100]
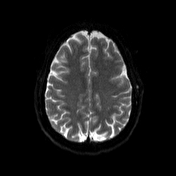
[im 83/100]
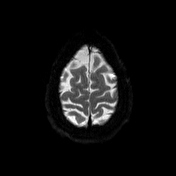
[im 100/100]
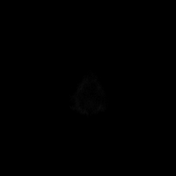

[Series 6: DWI · axial · 3.0mm · 1.36mm/px · z∈[-58,+87]mm · 4 of 50 slices shown (2 of 2)]
[im 1/50]
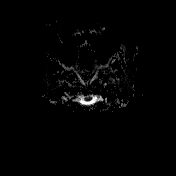
[im 17/50]
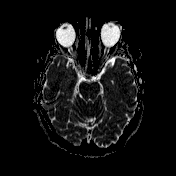
[im 33/50]
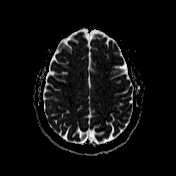
[im 50/50]
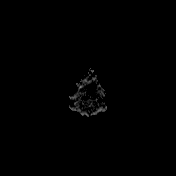

[Series 7: T1 · sagittal · 5.0mm · 0.75mm/px · 2 of 24 slices shown (1 of 2)]
[im 1/24]
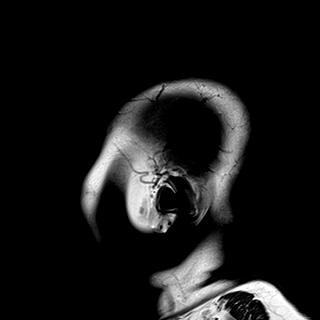
[im 24/24]
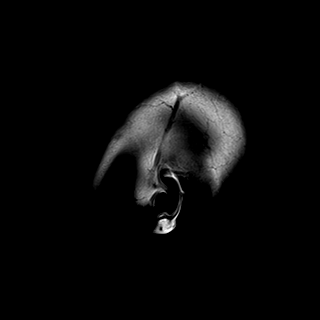

[Series 8: T2 · axial · 5.0mm · 0.62mm/px · z∈[-62,+98]mm · 2 of 26 slices shown]
[im 1/26]
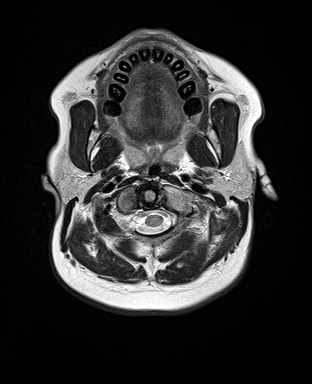
[im 26/26]
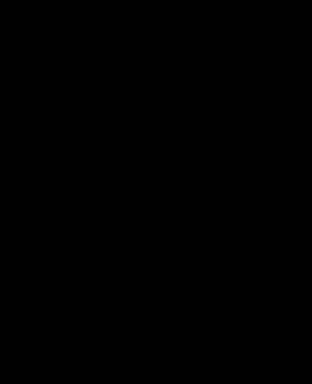

[Series 9: FLAIR · axial · 3.0mm · 0.75mm/px · z∈[-57,+93]mm · 4 of 52 slices shown]
[im 1/52]
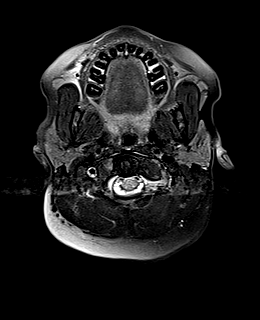
[im 18/52]
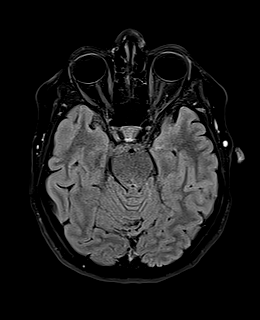
[im 35/52]
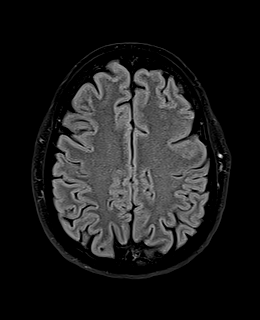
[im 52/52]
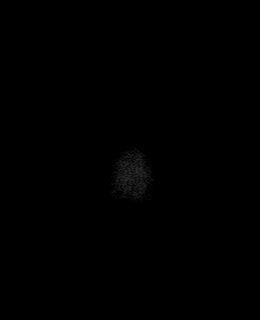

[Series 10: swi_images · axial · 3.0mm · 0.75mm/px · z∈[-69,+105]mm · 5 of 60 slices shown]
[im 1/60]
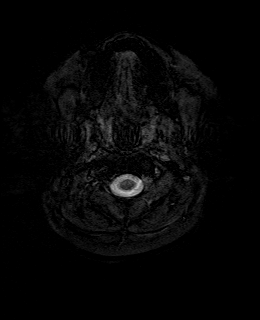
[im 15/60]
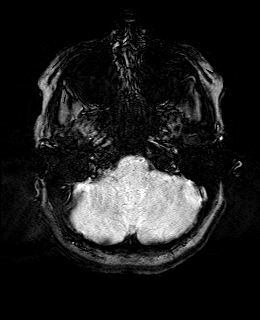
[im 30/60]
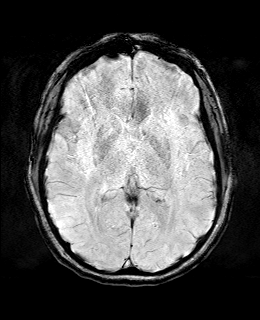
[im 45/60]
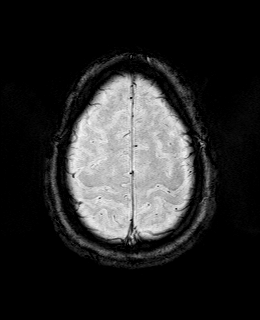
[im 60/60]
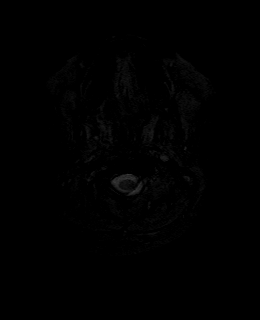

[Series 11: mip_images(sw) · axial · 24.0mm · 0.75mm/px · z∈[-59,+95]mm · 4 of 53 slices shown]
[im 1/53]
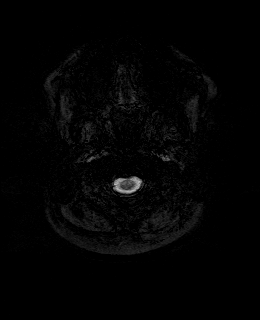
[im 18/53]
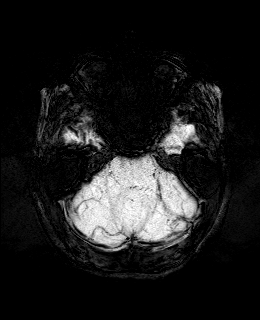
[im 35/53]
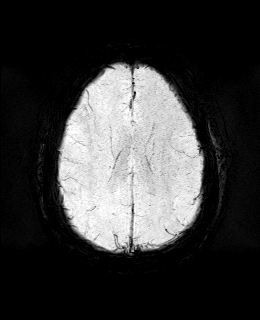
[im 53/53]
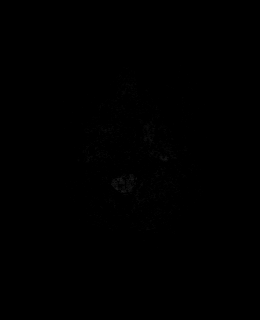

[Series 12: T1 · axial · 1.0mm · 0.94mm/px · z∈[-60,+96]mm · 12 of 160 slices shown (2 of 2)]
[im 1/160]
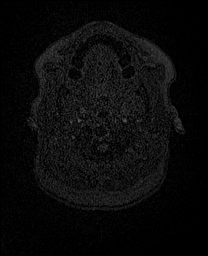
[im 15/160]
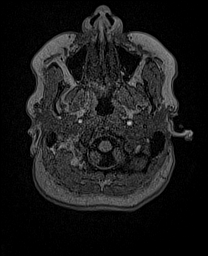
[im 29/160]
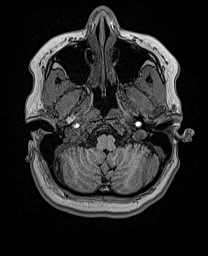
[im 44/160]
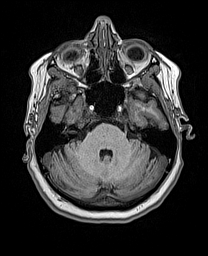
[im 58/160]
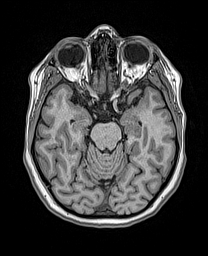
[im 73/160]
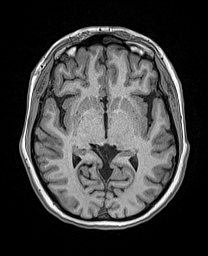
[im 87/160]
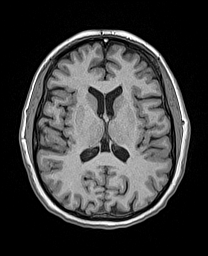
[im 102/160]
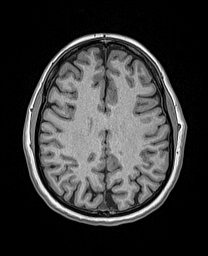
[im 116/160]
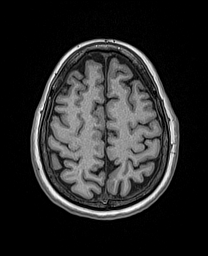
[im 131/160]
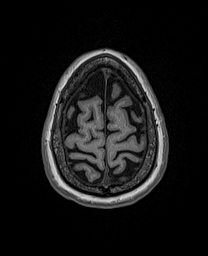
[im 145/160]
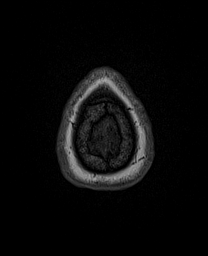
[im 160/160]
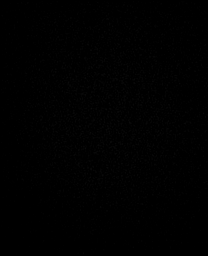

[Series 13: cor dwi_tracew · coronal · 5.0mm · 1.53mm/px · 4 of 52 slices shown]
[im 1/52]
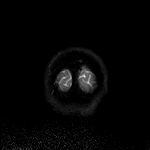
[im 18/52]
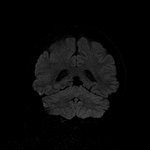
[im 35/52]
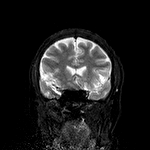
[im 52/52]
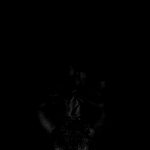

[Series 14: cor dwi_adc · coronal · 5.0mm · 1.53mm/px · 2 of 26 slices shown]
[im 1/26]
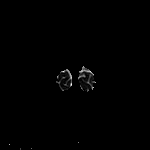
[im 26/26]
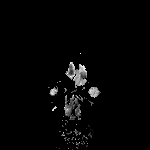

[Series 15: T2 post-contrast · coronal · 5.0mm · 0.57mm/px · 2 of 28 slices shown]
[im 1/28]
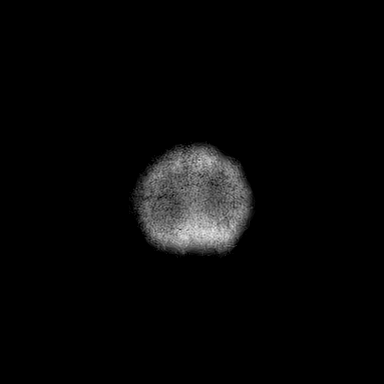
[im 28/28]
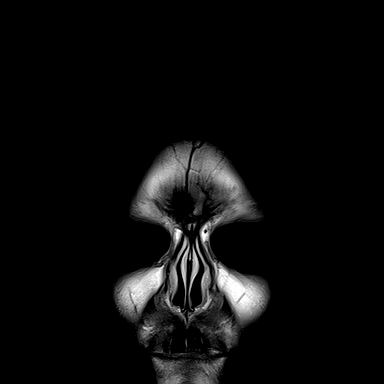

[48 of 48 positions shown; findings below may reference images not displayed]

FINDINGS: MRI HEAD FINDINGS

Brain: No acute infarct, mass effect or extra-axial collection. No
acute or chronic hemorrhage. Normal white matter signal, parenchymal
volume and CSF spaces. The midline structures are normal.

Vascular: Major flow voids are preserved.

Skull and upper cervical spine: Normal calvarium and skull base.
Visualized upper cervical spine and soft tissues are normal.

Sinuses/Orbits:No paranasal sinus fluid levels or advanced mucosal
thickening. No mastoid or middle ear effusion. Normal orbits.

MRA HEAD FINDINGS

POSTERIOR CIRCULATION:

--Vertebral arteries: Normal

--Inferior cerebellar arteries: Normal.

--Basilar artery: Normal.

--Superior cerebellar arteries: Normal.

--Posterior cerebral arteries: Normal.

ANTERIOR CIRCULATION:

--Intracranial internal carotid arteries: Normal.

--Anterior cerebral arteries (ACA): Normal.

--Middle cerebral arteries (MCA): Normal.

ANATOMIC VARIANTS: None
IMPRESSION: Normal MRI/MRA of the brain. No findings of metastatic disease on
this noncontrast examination.

## 2021-03-26 IMAGING — NM NM BONE WHOLE BODY
4 series · 4 of 4 positions shown · non-contrast
Comparison: Chest abdomen pelvis CT [DATE].  Hip MRI [DATE]

CLINICAL DATA: Breast cancer. Staging. Lesion on left iliac bone
and L3. Lesion on right femoral neck. Right femoral neck bone pain.

EXAM:
NUCLEAR MEDICINE WHOLE BODY BONE SCAN
TECHNIQUE: Whole body anterior and posterior images were obtained approximately
3 hours after intravenous injection of radiopharmaceutical.
RADIOPHARMACEUTICALS:  20 mCi [T3] MDP IV

[Series 1: wbr_bone_40 whole body · 2.66mm/px · 1 of 1 slices shown (1 of 2)]
[im 1/1]
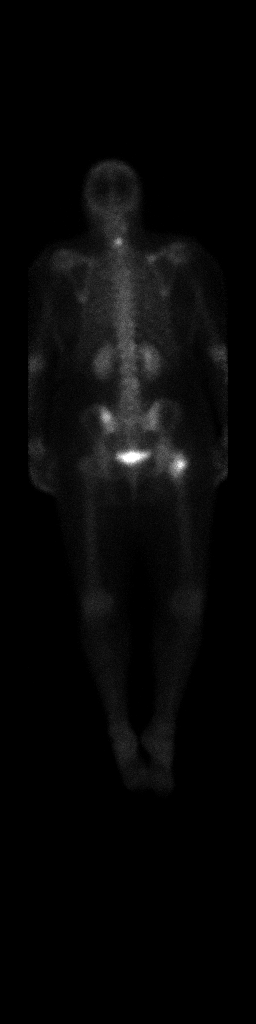

[Series 1: whole body · 2.66mm/px · 1 of 1 slices shown (1 of 2)]
[im 1/1]
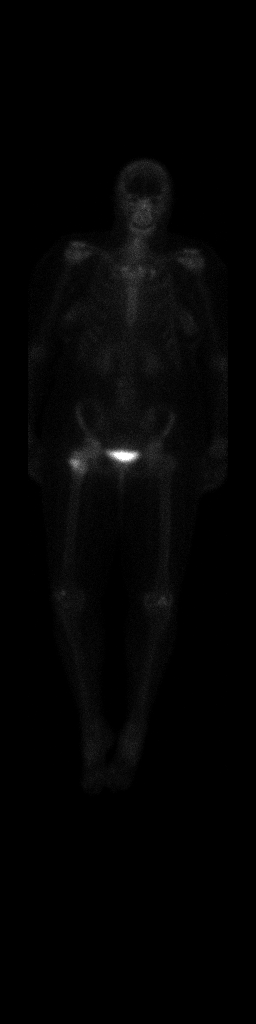

[Series 1: whole body · 2.66mm/px · 1 of 1 slices shown (2 of 2)]
[im 1/1]
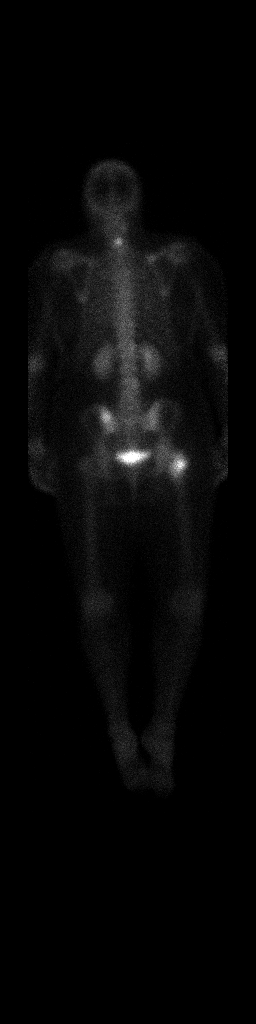

[Series 1: wbr_bone_40 whole body · 2.66mm/px · 1 of 1 slices shown (2 of 2)]
[im 1/1]
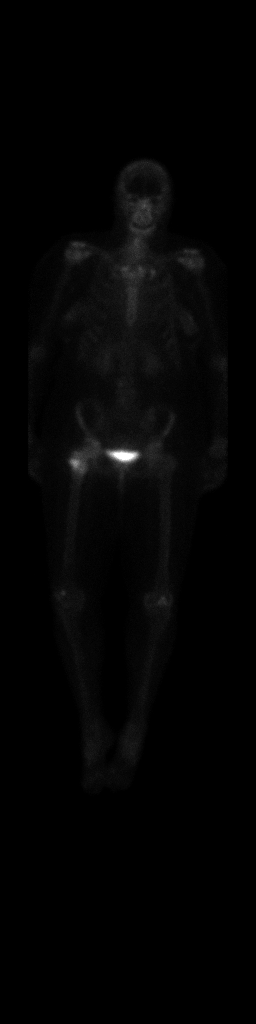

[4 of 4 positions shown; findings below may reference images not displayed]

FINDINGS: Increased radiotracer uptake in the right femoral neck at site of
lesion on MRI. Asymmetric increased uptake in the left iliac bone
corresponds to lesion on pelvic MRI. There is increased uptake at
the cervicothoracic junction there is faint increased uptake within
right aspect of L3 at site of lucent lesion on CT. Some of the small
lesions on pelvis MRI have no definite uptake. Symmetric uptake of
both knees and shoulders is typically degenerative. Both kidneys are
visualized.
IMPRESSION: Osseous metastatic disease. Most intense radiotracer uptake involves
the right proximal femur, with lesions demonstrated on CT and MRI.
There also lesions in the left iliac bone, faintly at L3, as well as
at the cervicothoracic junction.

## 2021-03-26 MED ORDER — GADOBUTROL 1 MMOL/ML IV SOLN
8.5000 mL | Freq: Once | INTRAVENOUS | Status: AC | PRN
  Administered 2021-03-26: 8.5 mL via INTRAVENOUS

## 2021-03-26 MED ORDER — HYDROCODONE-ACETAMINOPHEN 5-325 MG PO TABS: 2.0000 | ORAL_TABLET | Freq: Four times a day (QID) | ORAL | 0 refills | Status: DC | PRN

## 2021-03-26 MED ORDER — SODIUM CHLORIDE 0.9 % IV BOLUS
1000.0000 mL | Freq: Once | INTRAVENOUS | Status: AC
  Administered 2021-03-26: 1000 mL via INTRAVENOUS

## 2021-03-26 MED ORDER — TECHNETIUM TC 99M MEDRONATE IV KIT
20.0000 | PACK | Freq: Once | INTRAVENOUS | Status: AC | PRN
  Administered 2021-03-26: 20 via INTRAVENOUS

## 2021-03-26 MED ORDER — KETOROLAC TROMETHAMINE 30 MG/ML IJ SOLN
30.0000 mg | Freq: Once | INTRAMUSCULAR | Status: DC
  Filled 2021-03-26: qty 1

## 2021-03-26 MED ORDER — DIPHENHYDRAMINE HCL 50 MG/ML IJ SOLN
25.0000 mg | Freq: Once | INTRAMUSCULAR | Status: AC
  Administered 2021-03-26: 25 mg via INTRAVENOUS
  Filled 2021-03-26: qty 1

## 2021-03-26 MED ORDER — PROCHLORPERAZINE EDISYLATE 10 MG/2ML IJ SOLN
10.0000 mg | Freq: Once | INTRAMUSCULAR | Status: AC
  Administered 2021-03-26: 10 mg via INTRAVENOUS
  Filled 2021-03-26: qty 2

## 2021-03-26 NOTE — ED Notes (Signed)
Per MRI pt will be taken at 2000.  Pt notified.

## 2021-03-26 NOTE — ED Triage Notes (Signed)
Pt reports R hip pain worsening over the last 2 days and R temporal headache and blurry vision. Also reports that she is experiencing some confusion. She is a cancer patient. No currently on treatments, but is scheduled for a hip procedure in the future. Dr. Percell Miller is the surgeon and Dr Sonny Dandy is her oncologist.

## 2021-03-26 NOTE — ED Notes (Signed)
Patient transported to MRI 

## 2021-03-26 NOTE — ED Provider Notes (Addendum)
Emergency Medicine Provider Triage Evaluation Note  Natalie Griffin , a 45 y.o. female  was evaluated in triage.  Pt complains of headache and double vision along with right hip pain. Primary breast CA to the left breast s/p lumpectomy. Headache is sharp to the frontal lobe with radiation to the upper right side of her headache. Double vision described as vertical changes. Right hip pain which she has been ambulating with crutches and now cannot ambulate even with assistance. Prior MRI of the right hip showed lesions to the right hip.   Review of Systems  Positive: Headaches, changes in vision, nausea, vomiting Negative: Chest pain, shortness of breath, fever  Physical Exam  BP 109/69 (BP Location: Right Arm)   Pulse 78   Temp 99.2 F (37.3 C) (Oral)   Resp 16   LMP 03/11/2021   SpO2 96%  Gen:   Awake, no distress   Resp:  Normal effort  MSK:   Moves extremities without difficulty  Other:  Deceased sensation to the right facial cheek. Worse pain with movement with right hip.   Medical Decision Making  Medically screening exam initiated at 3:47 PM.  Appropriate orders placed.  Natalie Griffin was informed that the remainder of the evaluation will be completed by another provider, this initial triage assessment does not replace that evaluation, and the importance of remaining in the ED until their evaluation is complete.  Oncology Dr. Sonny Dandy oncologist. Patient request MRI versus CT due to financial strain. She is also reporting worsen pain tot he right hip.      Janeece Fitting, PA-C 03/26/21 1553    Janeece Fitting, PA-C 03/26/21 1600    Little, Wenda Overland, MD 03/26/21 [phone]

## 2021-03-26 NOTE — Progress Notes (Signed)
HEMATOLOGY-ONCOLOGY TELEPHONE VISIT PROGRESS NOTE  I connected with Natalie Griffin on 03/27/2021 at  9:45 AM EDT by telephone and verified that I am speaking with the correct person using two identifiers.  I discussed the limitations, risks, security and privacy concerns of performing an evaluation and management service by telephone and the availability of in person appointments.  I also discussed with the patient that there may be a patient responsible charge related to this service. The patient expressed understanding and agreed to proceed.   History of Present Illness: Natalie Griffin is a 45 y.o. female with above-mentioned history of left breast cancer who underwent a left lumpectomy. Bone scan on 03/26/21 showed osseus metastatic disease with most intense radiotracer uptake involves the right proximal femur and lesions in the left iliac bone, faintly at L3, as well as at the cervicothoracic junction. She reports over the telephone today for follow-up.  She went to emergency room yesterday complaining of severe headaches and had a brain MRI which was negative for any abnormalities.  She was given a cocktail of medicines which seem to have helped her headaches.  She was in excruciating pain with regards to her hip and they provided her with hydrocodone.  She has not picked it up yet.  She has not started the letrozole at this time.  Her pharmacy just called her that it is available for pickup.  Oncology History  Malignant neoplasm of upper-inner quadrant of left breast in female, estrogen receptor positive ([place])  04/11/2020 Initial Diagnosis   Patient palpated a left breast lump. Mammogram on 03/08/20 showed a 3.0cm mass at the 11 o'clock position with no axillary adenopathy. 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   05/09/2020 Cancer Staging   Staging form: Breast, AJCC 8th Edition - Clinical stage from  05/09/2020: Stage IB (cT2, cN0, cM0, G2, ER+, PR+, HER2+) - Signed by Nicholas Lose, MD on 05/09/2020    06/04/2020 Surgery   Left lumpectomy ([name]): invasive and in situ ductal carcinoma, 3.2cm, clear margins, one left axillary lymph node negative for carcinoma.    02/2021 Relapse/Recurrence   Patient went to orthopedics for right hip pain.  Imaging revealed bone metastasis.  CT CAP: Cortical destruction of the right femoral neck consistent with skeletal metastases at risk for pathologic fracture.  Lucent lesion in the left iliac bone and L3 vertebral body consistent with multifocal skeletal metastases.     Observations/Objective:     Assessment Plan:  Malignant neoplasm of upper-inner quadrant of left breast in female, estrogen receptor positive ([place]) 04/11/2020:Patient palpated a left breast lump. Mammogram on 03/08/20 showed a 3.0cm mass at the 11 o'clock position with no axillary adenopathy. 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  Patient refused adjuvant chemo, adjuvant radiation and adjuvant antiestrogen therapy.   Patient went to orthopedics for right hip pain.  Imaging revealed bone metastasis.  CT CAP: Cortical destruction of the right femoral neck consistent with skeletal metastases at risk for pathologic fracture.  Lucent lesion in the left iliac bone and L3 vertebral body consistent with multifocal skeletal metastases. --------------------------------------------------------------- Treatment plan: 1.  Dr. Percell Miller will perform right femoral neck for stabilization. 2. we will confirm the final pathology whether it  is ER/PR positive and HER2 positive as before. 3.  Treatment plan: Letrozole, may add Verzinio and Herceptin based on final path from the  femur.  She will start letrozole today.   Severe hip pain: She cannot wait for surgery to happen.  This is expected to be on 04/01/2021.  Return to clinic on 04/08/2021 after surgery to discuss final pathology with a telephone visit She does not have great support.  Her sister is coming in to help her postoperatively.    I discussed the assessment and treatment plan with the patient. The patient was provided an opportunity to ask questions and all were answered. The patient agreed with the plan and demonstrated an understanding of the instructions. The patient was advised to call back or seek an in-person evaluation if the symptoms worsen or if the condition fails to improve as anticipated.   Total time spent: 11 mins including non-face to face time and time spent for planning, charting and coordination of care  Vinay K [name], MD 03/27/2021    I, Kirstyn Evans, am acting as scribe for Vinay [name], MD.  I have reviewed the above documentation for accuracy and completeness, and I agree with the above.    

## 2021-03-26 NOTE — ED Provider Notes (Signed)
[name] DEPT Provider Note   CSN: [id number] Arrival date & time: 03/26/21  1524     History Chief Complaint  Patient presents with   Headache   Hip Pain    R    Natalie Griffin is a 45 y.o. female.  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  Patient was recently diagnosed with metastatic breast cancer and is undergoing cancer work-up here, had a nuclear medicine scan today as part of her work-up.  She has been having ongoing right hip pain due to metastatic lesions and has surgery scheduled next week for a pin in her femur by Dr. Percell Miller.  2 days ago, she had a gradual onset of constant, progressively worsening right frontal headache that was initially a dull, bothersome pain but has now become throbbing and severe.  She had a few episodes of vomiting and reports that she has been having bilateral double vision.  She denies history of headaches or migraines.  She has tried Excedrin, last dose this morning, without relief.  She denies any speech problems, focal weakness/numbness, or recent head injury.  She is not on any daily medications.  No fevers.  The history is provided by the patient.  Headache Hip Pain Associated symptoms include headaches.      Past Medical History:  Diagnosis Date   Anemia    Bone metastasis ([place])    Breast cancer ([place]) 2021   Left breast invasive ductal carcinoma   Cancer ([place]) 2021   Left breast   Family history of adverse reaction to anesthesia    Father and Aunt have pseudocholinesterase deficiency - Trouble waking up from anesthesia   History of hiatal hernia    History of kidney stones    noted on CT Left nonobstructive    Patient Active Problem List   Diagnosis Date Noted   Malignant neoplasm of upper-inner quadrant of left breast in female, estrogen receptor positive ([place])  05/09/2020    Past Surgical History:  Procedure Laterality Date   BREAST LUMPECTOMY WITH RADIOACTIVE SEED AND SENTINEL LYMPH NODE BIOPSY Left 06/04/2020   Procedure: LEFT BREAST LUMPECTOMY WITH RADIOACTIVE SEED AND SENTINEL LYMPH NODE MAPPING;  Surgeon: Erroll Luna, MD;  Location: [facility];  Service: General;  Laterality: Left;  PEC BLOCK, RNFA   WISDOM TOOTH EXTRACTION       OB History   No obstetric history on file.     History reviewed. No pertinent family history.  Social History   Tobacco Use   Smoking status: Former    Pack years: 0.00    Types: Cigarettes    Quit date: 05/19/2020    Years since quitting: 0.8   Smokeless tobacco: Never  Vaping Use   Vaping Use: Never used  Substance Use Topics   Alcohol use: Yes    Comment: social drinker   Drug use: Never    Home Medications Prior to Admission medications   Medication Sig Start Date End Date Taking? Authorizing Provider  HYDROcodone-acetaminophen (NORCO/VICODIN) 5-325 MG tablet Take 2 tablets by mouth every 6 (six) hours as needed for up to 7 days for severe pain. 03/26/21 04/02/21 Yes Jadelyn Elks, Wenda Overland, MD  ALPRAZolam Duanne Moron) 0.25 MG tablet Take 1 tablet (0.25 mg total) by mouth at bedtime as needed for anxiety. 03/18/21   Nicholas Lose, MD  calcium carbonate (OS-CAL) 600 MG TABS tablet Take 600 mg  by mouth daily with breakfast.    [provider]  letrozole (FEMARA) 2.5 MG tablet Take 1 tablet (2.5 mg total) by mouth daily. Patient not taking: No sig reported 03/19/21   Nicholas Lose, MD  Multiple Vitamins-Minerals (MULTIVITAMIN WITH MINERALS) tablet Take 1 tablet by mouth daily.    [provider]  Vitamin D, Cholecalciferol, 25 MCG (1000 UT) TABS Take 1 tablet by mouth daily. 12/02/20   Nicholas Lose, MD    Allergies    Adhesive [tape], Tetanus toxoid, Succinylcholine, and Contrast media [iodinated diagnostic agents]  Review of Systems   Review of Systems  Neurological:  Positive for  headaches.  All other systems reviewed and are negative except that which was mentioned in HPI  Physical Exam Updated Vital Signs BP 99/65   Pulse 63   Temp 99.2 F (37.3 C) (Oral)   Resp 18   LMP 03/11/2021   SpO2 97%   Physical Exam Vitals and nursing note reviewed.  Constitutional:      General: She is not in acute distress.    Appearance: She is well-developed.     Comments: Awake, alert  HENT:     Head: Normocephalic and atraumatic.  Eyes:     Extraocular Movements: Extraocular movements intact.     Conjunctiva/sclera: Conjunctivae normal.     Pupils: Pupils are equal, round, and reactive to light.  Cardiovascular:     Rate and Rhythm: Normal rate and regular rhythm.     Heart sounds: Normal heart sounds. No murmur heard. Pulmonary:     Effort: Pulmonary effort is normal. No respiratory distress.     Breath sounds: Normal breath sounds.  Abdominal:     General: Bowel sounds are normal. There is no distension.     Palpations: Abdomen is soft.     Tenderness: There is no abdominal tenderness.  Musculoskeletal:     Cervical back: Neck supple.     Comments: Pain with R hip flexion/straight leg raise  Skin:    General: Skin is warm and dry.  Neurological:     Mental Status: She is alert and oriented to person, place, and time.     Cranial Nerves: No cranial nerve deficit.     Motor: No abnormal muscle tone.     Deep Tendon Reflexes: Reflexes are normal and symmetric.     Comments: Fluent speech, normal finger-to-nose testing, negative pronator drift, no clonus 5/5 strength and normal sensation x all 4 extremities Endorses double vision, both monocular and binocular, but visual fields intact   Psychiatric:        Mood and Affect: Mood normal.        Behavior: Behavior normal.        Thought Content: Thought content normal.        Judgment: Judgment normal.    ED Results / Procedures / Treatments   Labs (all labs ordered are listed, but only abnormal results are  displayed) Labs Reviewed  CBC WITH DIFFERENTIAL/PLATELET - Abnormal; Notable for the following components:      Result Value   WBC 15.8 (*)    Neutro Abs 10.9 (*)    Abs Immature Granulocytes 0.10 (*)    All other components within normal limits  COMPREHENSIVE METABOLIC PANEL  I-STAT BETA HCG BLOOD, ED ([facility], [facility], [facility] ONLY)    EKG None  Radiology MR ANGIO HEAD WO CONTRAST  Result Date: 03/26/2021 CLINICAL DATA:  History of breast cancer.  Concern for brain mass. EXAM: MRI HEAD  WITHOUT CONTRAST MRA HEAD WITHOUT CONTRAST TECHNIQUE: Multiplanar, multi-echo pulse sequences of the brain and surrounding structures were acquired without intravenous contrast. Angiographic images of the Circle of Willis were acquired using MRA technique without intravenous contrast. COMPARISON:  No pertinent prior exam. FINDINGS: MRI HEAD FINDINGS Brain: No acute infarct, mass effect or extra-axial collection. No acute or chronic hemorrhage. Normal white matter signal, parenchymal volume and CSF spaces. The midline structures are normal. Vascular: Major flow voids are preserved. Skull and upper cervical spine: Normal calvarium and skull base. Visualized upper cervical spine and soft tissues are normal. Sinuses/Orbits:No paranasal sinus fluid levels or advanced mucosal thickening. No mastoid or middle ear effusion. Normal orbits. MRA HEAD FINDINGS POSTERIOR CIRCULATION: --Vertebral arteries: Normal --Inferior cerebellar arteries: Normal. --Basilar artery: Normal. --Superior cerebellar arteries: Normal. --Posterior cerebral arteries: Normal. ANTERIOR CIRCULATION: --Intracranial internal carotid arteries: Normal. --Anterior cerebral arteries (ACA): Normal. --Middle cerebral arteries (MCA): Normal. ANATOMIC VARIANTS: None IMPRESSION: Normal MRI/MRA of the brain. No findings of metastatic disease on this noncontrast examination. Electronically Signed   By: Ulyses Jarred M.D.   On: 03/26/2021 21:42   MR BRAIN WO CONTRAST  Result  Date: 03/26/2021 CLINICAL DATA:  History of breast cancer.  Concern for brain mass. EXAM: MRI HEAD WITHOUT CONTRAST MRA HEAD WITHOUT CONTRAST TECHNIQUE: Multiplanar, multi-echo pulse sequences of the brain and surrounding structures were acquired without intravenous contrast. Angiographic images of the Circle of Willis were acquired using MRA technique without intravenous contrast. COMPARISON:  No pertinent prior exam. FINDINGS: MRI HEAD FINDINGS Brain: No acute infarct, mass effect or extra-axial collection. No acute or chronic hemorrhage. Normal white matter signal, parenchymal volume and CSF spaces. The midline structures are normal. Vascular: Major flow voids are preserved. Skull and upper cervical spine: Normal calvarium and skull base. Visualized upper cervical spine and soft tissues are normal. Sinuses/Orbits:No paranasal sinus fluid levels or advanced mucosal thickening. No mastoid or middle ear effusion. Normal orbits. MRA HEAD FINDINGS POSTERIOR CIRCULATION: --Vertebral arteries: Normal --Inferior cerebellar arteries: Normal. --Basilar artery: Normal. --Superior cerebellar arteries: Normal. --Posterior cerebral arteries: Normal. ANTERIOR CIRCULATION: --Intracranial internal carotid arteries: Normal. --Anterior cerebral arteries (ACA): Normal. --Middle cerebral arteries (MCA): Normal. ANATOMIC VARIANTS: None IMPRESSION: Normal MRI/MRA of the brain. No findings of metastatic disease on this noncontrast examination. Electronically Signed   By: Ulyses Jarred M.D.   On: 03/26/2021 21:42   NM Bone Scan Whole Body  Result Date: 03/26/2021 CLINICAL DATA:  Breast cancer. Staging. Lesion on left iliac bone and L3. Lesion on right femoral neck. Right femoral neck bone pain. EXAM: NUCLEAR MEDICINE WHOLE BODY BONE SCAN TECHNIQUE: Whole body anterior and posterior images were obtained approximately 3 hours after intravenous injection of radiopharmaceutical. RADIOPHARMACEUTICALS:  20 mCi Technetium-23m MDP IV  COMPARISON:  Chest abdomen pelvis CT 03/18/2021.  Hip MRI 03/14/2021 FINDINGS: Increased radiotracer uptake in the right femoral neck at site of lesion on MRI. Asymmetric increased uptake in the left iliac bone corresponds to lesion on pelvic MRI. There is increased uptake at the cervicothoracic junction there is faint increased uptake within right aspect of L3 at site of lucent lesion on CT. Some of the small lesions on pelvis MRI have no definite uptake. Symmetric uptake of both knees and shoulders is typically degenerative. Both kidneys are visualized. IMPRESSION: Osseous metastatic disease. Most intense radiotracer uptake involves the right proximal femur, with lesions demonstrated on CT and MRI. There also lesions in the left iliac bone, faintly at L3, as well as at the cervicothoracic junction. Electronically  Signed   By: Keith Rake M.D.   On: 03/26/2021 17:28    Procedures Procedures   Medications Ordered in ED Medications  ketorolac (TORADOL) 30 MG/ML injection 30 mg (30 mg Intravenous Not Given 03/26/21 2244)  sodium chloride 0.9 % bolus 1,000 mL (0 mLs Intravenous Stopped 03/26/21 1845)  diphenhydrAMINE (BENADRYL) injection 25 mg (25 mg Intravenous Given 03/26/21 1743)  prochlorperazine (COMPAZINE) injection 10 mg (10 mg Intravenous Given 03/26/21 1742)  gadobutrol (GADAVIST) 1 MMOL/ML injection 8.5 mL (8.5 mLs Intravenous Contrast Given 03/26/21 2108)    ED Course  I have reviewed the triage vital signs and the nursing notes.  Pertinent labs & imaging results that were available during my care of the patient were reviewed by me and considered in my medical decision making (see chart for details).    MDM Rules/Calculators/A&P                          Neurologically intact on exam, reassuring vital signs.  Given her known metastatic disease, differential includes brain metastasis, intracranial hemorrhage, or intracranial vascular process.  Patient is concerned about the cost of her  medical care and requested MRI only as opposed to CT followed by MRI. Gave migraine cocktail.  MRI/MRA negative acute. Called rads to read recent nuclear med scan--redemonstrated known bony lesions with no new mets.   After above cocktail, pt reported much improvement in headache.  She has a slight dull headache but significantly improved from previous.  She did request pain medications to help deal with her hip pain at home and given her known metastatic lesions, wrote short course of Norco to help with this pain until she has surgery next week.  Discussed follow-up with oncology and I have extensively reviewed return precautions regarding any new neurologic symptoms.  Patient voiced understanding. Final Clinical Impression(s) / ED Diagnoses Final diagnoses:  Bad headache  Right hip pain    Rx / DC Orders ED Discharge Orders          Ordered    HYDROcodone-acetaminophen (NORCO/VICODIN) 5-325 MG tablet  Every 6 hours PRN        03/26/21 2243             Macai Sisneros, Wenda Overland, MD 03/26/21 [phone]

## 2021-03-27 ENCOUNTER — Telehealth: Payer: Self-pay | Admitting: Hematology and Oncology

## 2021-03-27 ENCOUNTER — Inpatient Hospital Stay: Payer: 59 | Attending: Hematology and Oncology | Admitting: Hematology and Oncology

## 2021-03-27 DIAGNOSIS — Z170 Estrogen receptor positive status [ER+]: Secondary | ICD-10-CM | POA: Diagnosis not present

## 2021-03-27 DIAGNOSIS — C50212 Malignant neoplasm of upper-inner quadrant of left female breast: Secondary | ICD-10-CM | POA: Diagnosis not present

## 2021-03-27 NOTE — Telephone Encounter (Signed)
Scheduled appointment  per 07/06 los. Left message.

## 2021-03-27 NOTE — Assessment & Plan Note (Signed)
04/11/2020:Patient palpated a left breast lump. Mammogram on 03/08/20 showed a 3.0cm mass at the 11 o'clock position with no axillary adenopathy. 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 Patient refused adjuvant chemo, adjuvant radiation and adjuvant antiestrogen therapy.  Patient went to orthopedics for right hip pain.  Imaging revealed bone metastasis.  CT CAP: Cortical destruction of the right femoral neck consistent with skeletal metastases at risk for pathologic fracture.  Lucent lesion in the left iliac bone and L3 vertebral body consistent with multifocal skeletal metastases. --------------------------------------------------------------- Treatment plan: 1.  Dr. Percell Miller will perform right femoral neck for stabilization. 2. we will confirm the final pathology whether it is ER/PR positive and HER2 positive as before. 3.  Treatment plan: Letrozole, may add Verzinio and Herceptin based on final path from the femur  Letrozole toxicities:  Return to clinic after surgery to discuss final pathology.

## 2021-04-01 ENCOUNTER — Ambulatory Visit (HOSPITAL_COMMUNITY): Payer: 59

## 2021-04-01 ENCOUNTER — Inpatient Hospital Stay (HOSPITAL_COMMUNITY)
Admission: AD | Admit: 2021-04-01 | Discharge: 2021-04-02 | DRG: 481 | Disposition: A | Discharge status: Home Health | Payer: 59 | Attending: Orthopedic Surgery | Admitting: Orthopedic Surgery

## 2021-04-01 ENCOUNTER — Ambulatory Visit (HOSPITAL_COMMUNITY): Payer: 59 | Admitting: Emergency Medicine

## 2021-04-01 ENCOUNTER — Inpatient Hospital Stay (HOSPITAL_COMMUNITY): Payer: 59

## 2021-04-01 ENCOUNTER — Encounter (HOSPITAL_COMMUNITY): Payer: Self-pay | Admitting: Orthopedic Surgery

## 2021-04-01 ENCOUNTER — Encounter (HOSPITAL_COMMUNITY)
Admission: AD | Disposition: A | Discharge status: Home Health | Payer: Self-pay | Source: Home / Self Care | Attending: Orthopedic Surgery

## 2021-04-01 ENCOUNTER — Other Ambulatory Visit: Payer: Self-pay

## 2021-04-01 DIAGNOSIS — M84459A Pathological fracture, hip, unspecified, initial encounter for fracture: Secondary | ICD-10-CM | POA: Diagnosis not present

## 2021-04-01 DIAGNOSIS — C801 Malignant (primary) neoplasm, unspecified: Secondary | ICD-10-CM | POA: Diagnosis not present

## 2021-04-01 DIAGNOSIS — C7951 Secondary malignant neoplasm of bone: Secondary | ICD-10-CM | POA: Diagnosis present

## 2021-04-01 DIAGNOSIS — M84451A Pathological fracture, right femur, initial encounter for fracture: Secondary | ICD-10-CM | POA: Diagnosis not present

## 2021-04-01 DIAGNOSIS — S72001A Fracture of unspecified part of neck of right femur, initial encounter for closed fracture: Secondary | ICD-10-CM | POA: Diagnosis not present

## 2021-04-01 DIAGNOSIS — Z888 Allergy status to other drugs, medicaments and biological substances status: Secondary | ICD-10-CM | POA: Diagnosis not present

## 2021-04-01 DIAGNOSIS — Z20822 Contact with and (suspected) exposure to covid-19: Secondary | ICD-10-CM | POA: Diagnosis not present

## 2021-04-01 DIAGNOSIS — Z91048 Other nonmedicinal substance allergy status: Secondary | ICD-10-CM

## 2021-04-01 DIAGNOSIS — W19XXXA Unspecified fall, initial encounter: Secondary | ICD-10-CM | POA: Diagnosis present

## 2021-04-01 DIAGNOSIS — Z887 Allergy status to serum and vaccine status: Secondary | ICD-10-CM

## 2021-04-01 DIAGNOSIS — Z9889 Other specified postprocedural states: Secondary | ICD-10-CM

## 2021-04-01 DIAGNOSIS — Z91041 Radiographic dye allergy status: Secondary | ICD-10-CM

## 2021-04-01 DIAGNOSIS — C50912 Malignant neoplasm of unspecified site of left female breast: Secondary | ICD-10-CM | POA: Diagnosis not present

## 2021-04-01 DIAGNOSIS — D630 Anemia in neoplastic disease: Secondary | ICD-10-CM | POA: Diagnosis not present

## 2021-04-01 DIAGNOSIS — Z419 Encounter for procedure for purposes other than remedying health state, unspecified: Secondary | ICD-10-CM

## 2021-04-01 DIAGNOSIS — Z87442 Personal history of urinary calculi: Secondary | ICD-10-CM | POA: Diagnosis not present

## 2021-04-01 DIAGNOSIS — Z170 Estrogen receptor positive status [ER+]: Secondary | ICD-10-CM | POA: Diagnosis not present

## 2021-04-01 DIAGNOSIS — Z87891 Personal history of nicotine dependence: Secondary | ICD-10-CM | POA: Diagnosis not present

## 2021-04-01 DIAGNOSIS — Z79899 Other long term (current) drug therapy: Secondary | ICD-10-CM | POA: Diagnosis not present

## 2021-04-01 DIAGNOSIS — R531 Weakness: Secondary | ICD-10-CM | POA: Diagnosis not present

## 2021-04-01 DIAGNOSIS — C50212 Malignant neoplasm of upper-inner quadrant of left female breast: Secondary | ICD-10-CM | POA: Diagnosis not present

## 2021-04-01 HISTORY — DX: Secondary malignant neoplasm of bone: C79.51

## 2021-04-01 HISTORY — DX: Personal history of urinary calculi: Z87.442

## 2021-04-01 HISTORY — PX: FEMUR IM NAIL: SHX1597

## 2021-04-01 HISTORY — DX: Personal history of other diseases of the digestive system: Z87.19

## 2021-04-01 LAB — BASIC METABOLIC PANEL
Anion gap: 9 (ref 5–15)
BUN: 8 mg/dL (ref 6–20)
CO2: 23 mmol/L (ref 22–32)
Calcium: 9.8 mg/dL (ref 8.9–10.3)
Chloride: 106 mmol/L (ref 98–111)
Creatinine, Ser: 0.84 mg/dL (ref 0.44–1.00)
GFR, Estimated: >60 mL/min (ref >60)
Glucose, Bld: 112 mg/dL — ABNORMAL HIGH (ref 70–99)
Potassium: 3.9 mmol/L (ref 3.5–5.1)
Sodium: 138 mmol/L (ref 135–145)

## 2021-04-01 LAB — SARS CORONAVIRUS 2 BY RT PCR (HOSPITAL ORDER, PERFORMED IN ~~LOC~~ HOSPITAL LAB): SARS Coronavirus 2: NEGATIVE

## 2021-04-01 LAB — CBC
HCT: 36.1 % (ref 36.0–46.0)
Hemoglobin: 11.7 g/dL — ABNORMAL LOW (ref 12.0–15.0)
MCH: 28.8 pg (ref 26.0–34.0)
MCHC: 32.4 g/dL (ref 30.0–36.0)
MCV: 88.9 fL (ref 80.0–100.0)
Platelets: 316 10*3/uL (ref 150–400)
RBC: 4.06 MIL/uL (ref 3.87–5.11)
RDW: 13.2 % (ref 11.5–15.5)
WBC: 11.9 10*3/uL — ABNORMAL HIGH (ref 4.0–10.5)
nRBC: 0 % (ref 0.0–0.2)

## 2021-04-01 LAB — HCG, SERUM, QUALITATIVE: Preg, Serum: NEGATIVE

## 2021-04-01 IMAGING — RF DG HIP (WITH PELVIS) OPERATIVE*R*
1 series · 4 of 4 positions shown · non-contrast
Comparison: Whole-body bone scan [DATE]. Plain films right hip
[DATE].

CLINICAL DATA: Intraoperative imaging for prophylactic placement of
a right intramedullary nail in a patient with an impending right hip
fracture due to metastatic breast cancer.

EXAM:
OPERATIVE RIGHT HIP (WITH PELVIS IF PERFORMED) 4 VIEWS
TECHNIQUE: Fluoroscopic spot image(s) were submitted for interpretation
post-operatively.

[Series 1: unknown protocol · 0.14mm/px · 4 of 4 slices shown]
[im 1/4]
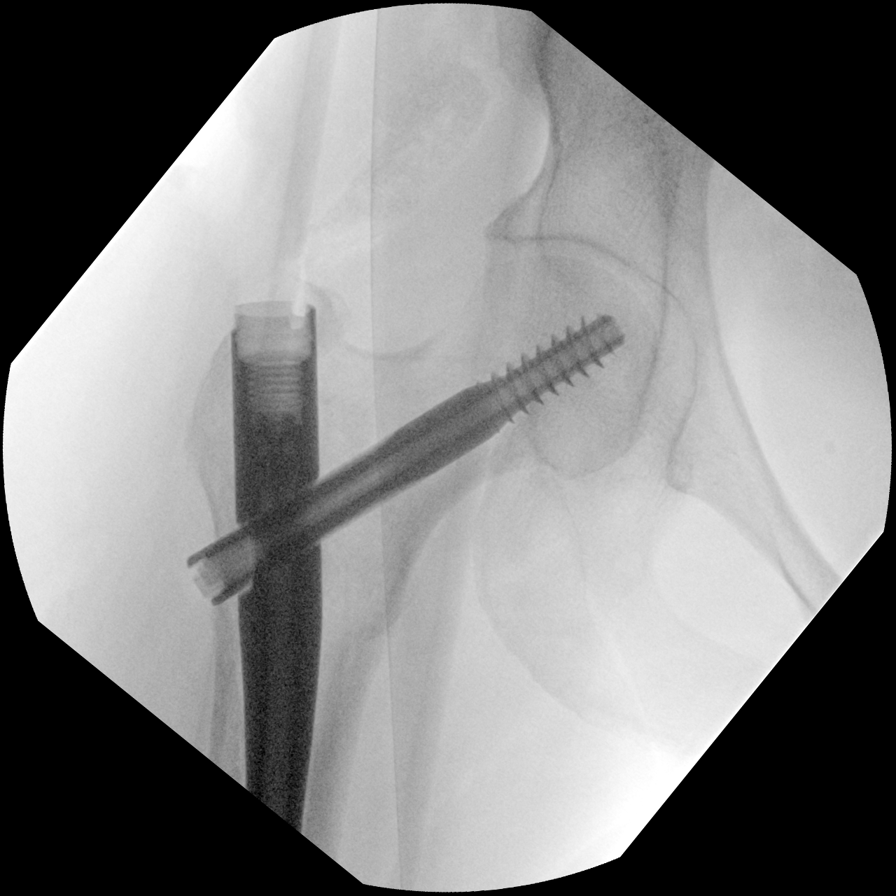
[im 2/4]
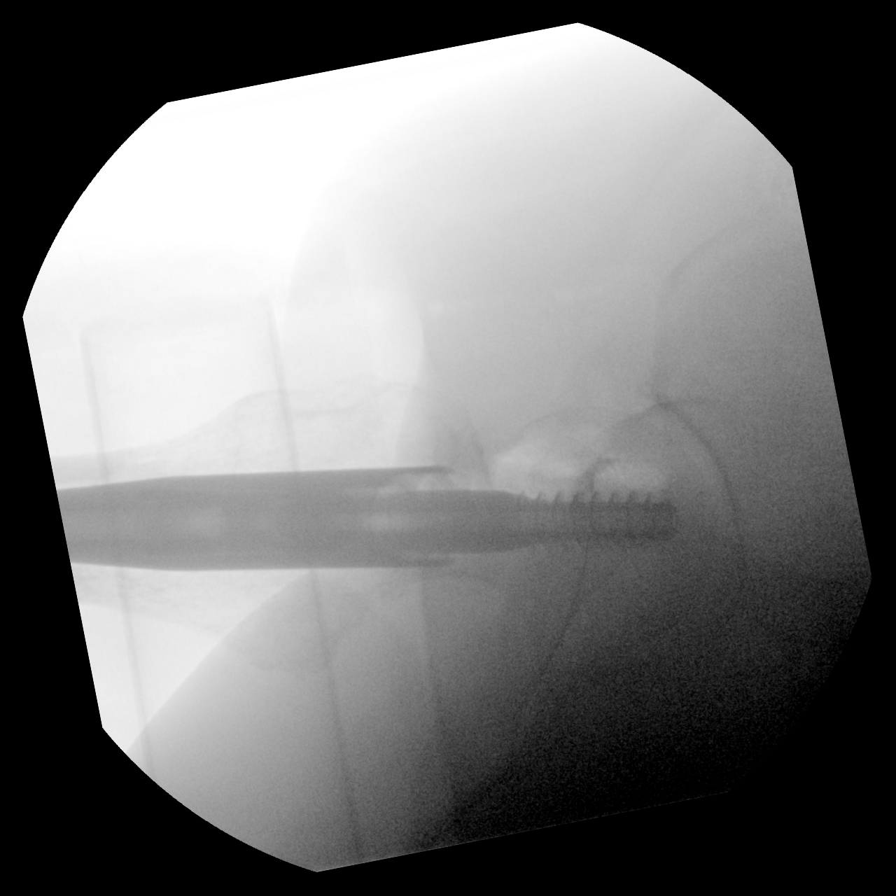
[im 3/4]
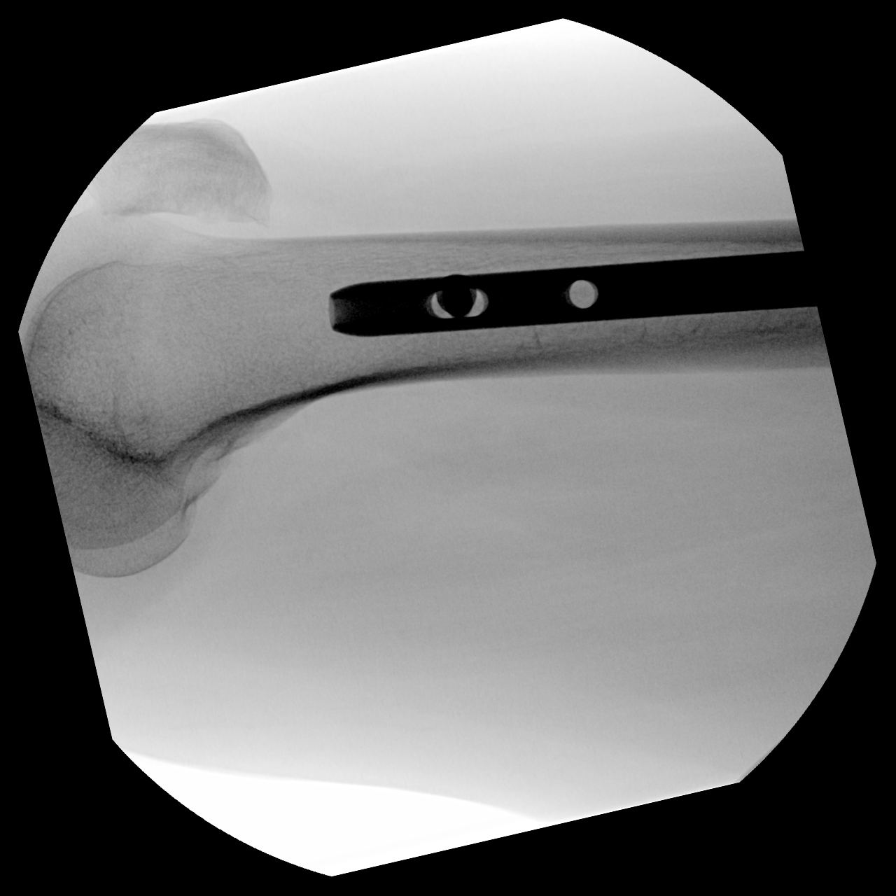
[im 4/4]
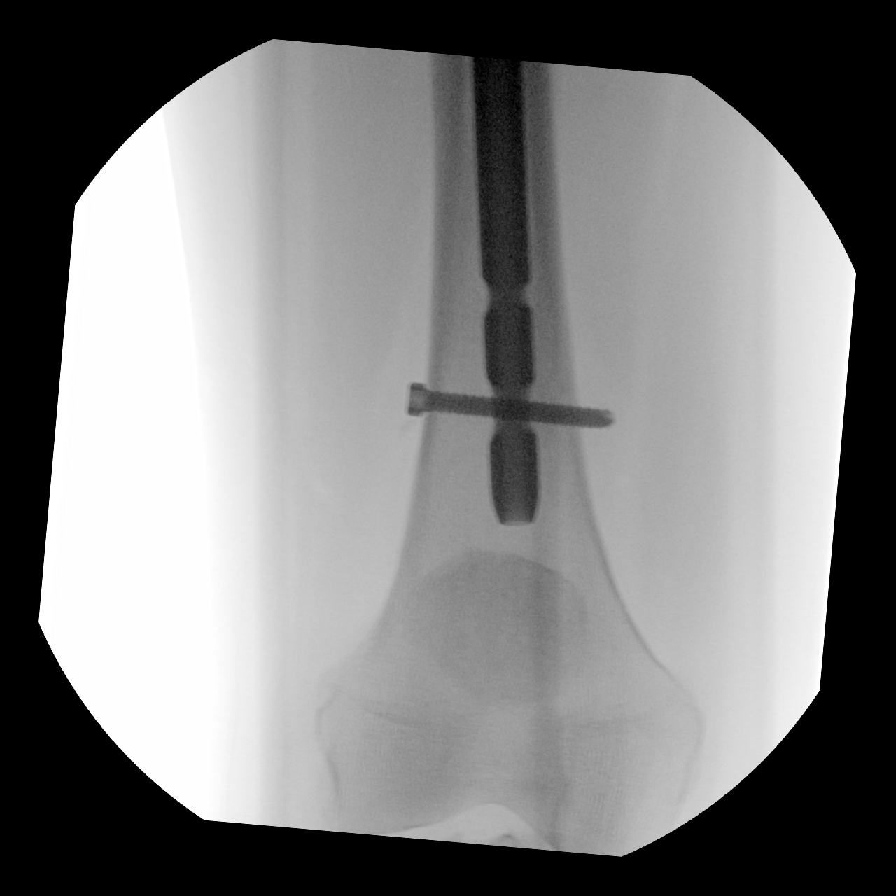

[4 of 4 positions shown; findings below may reference images not displayed]

FINDINGS: Provided images demonstrate placement of a hip screw and long
intramedullary nail with a single distal screw. Hardware is intact.
No acute abnormality is identified.
IMPRESSION: Intraoperative imaging for prophylactic placement a right hip screw
and intramedullary nail.

## 2021-04-01 IMAGING — DX DG PORTABLE PELVIS
1 series · 1 of 1 positions shown · non-contrast
Comparison: MRI [DATE]

CLINICAL DATA: Postoperative right femoral IM nail placement

EXAM:
DG HIP (WITH OR WITHOUT PELVIS) 1V PORT RIGHT; PORTABLE PELVIS 1-2
VIEWS

[pelvis ap]
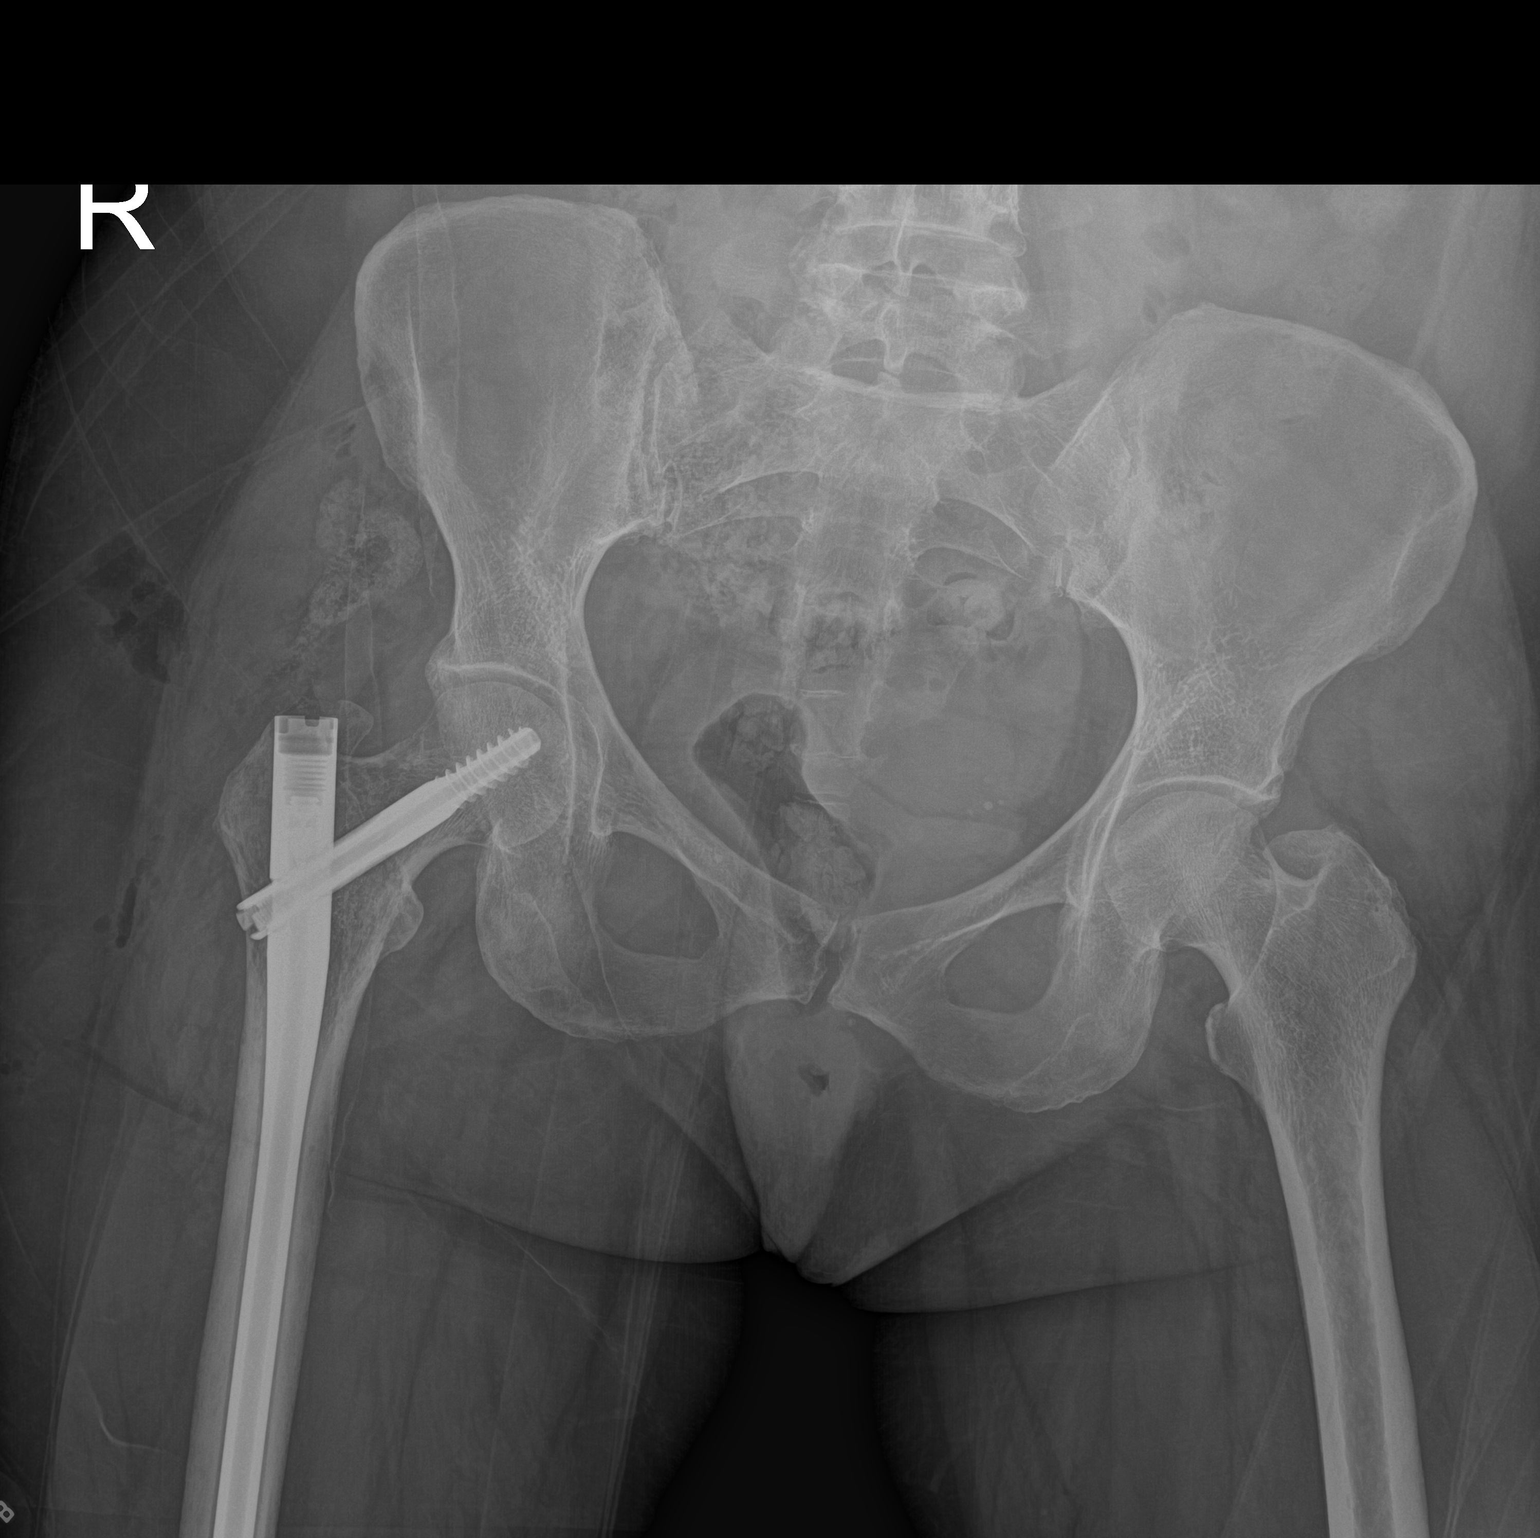

[1 of 1 positions shown; findings below may reference images not displayed]

FINDINGS: Interval postsurgical changes to the right femur with long
intramedullary rod, distal interlocking screw, and proximal lag
screw. No perihardware fracture is seen. There is somewhat
permeative appearance of the right femoral neck and
intertrochanteric region compatible with known marrow replacing bone
lesion better seen on recent MRI. Expected postoperative changes
within the overlying soft tissues. Additional known bony lesions
within the pelvis are not well seen radiographically.
IMPRESSION: Interval postsurgical changes to the right femur without evidence of
hardware complication.

## 2021-04-01 IMAGING — RF DG C-ARM 1-60 MIN-NO REPORT
1 series · 4 of 4 positions shown · non-contrast
Comparison: Whole-body bone scan [DATE]. Plain films right hip
[DATE].

CLINICAL DATA: Intraoperative imaging for prophylactic placement of
a right intramedullary nail in a patient with an impending right hip
fracture due to metastatic breast cancer.

EXAM:
OPERATIVE RIGHT HIP (WITH PELVIS IF PERFORMED) 4 VIEWS
TECHNIQUE: Fluoroscopic spot image(s) were submitted for interpretation
post-operatively.

[Series 1: unknown protocol · 0.14mm/px · 4 of 4 slices shown]
[im 1/4]
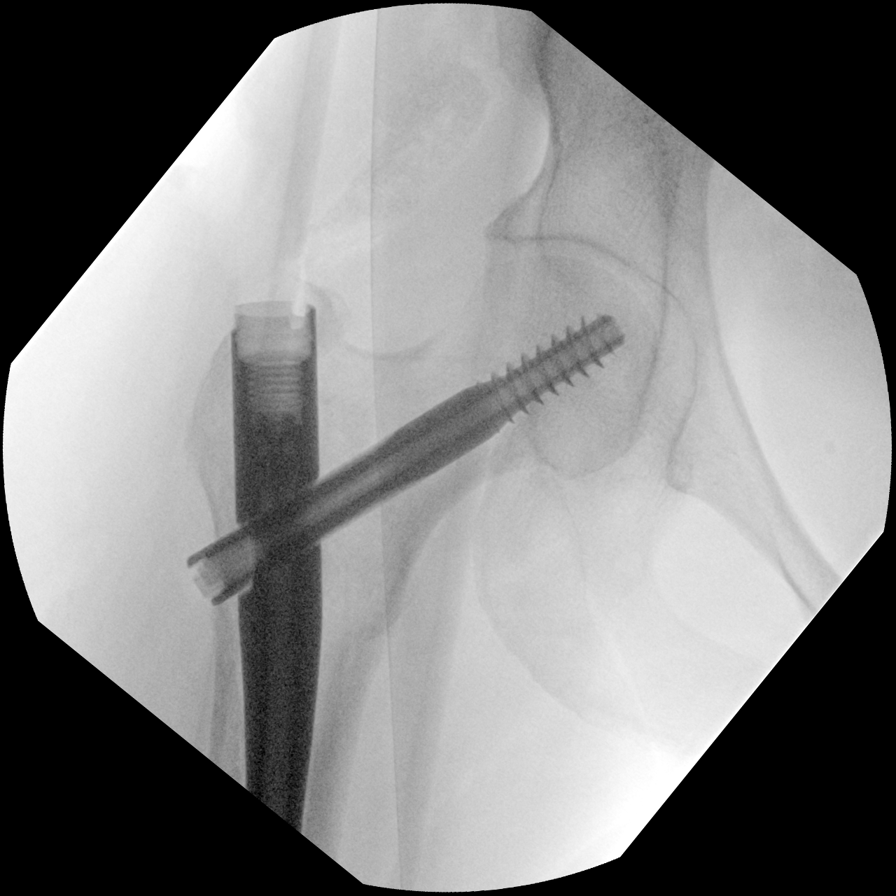
[im 2/4]
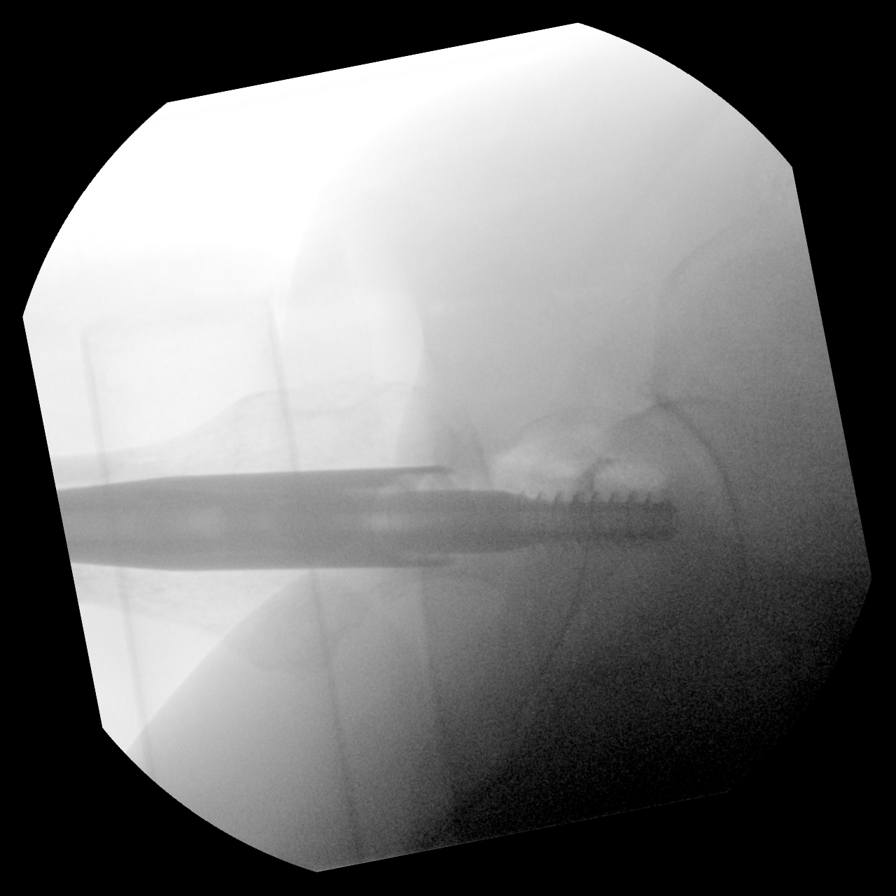
[im 3/4]
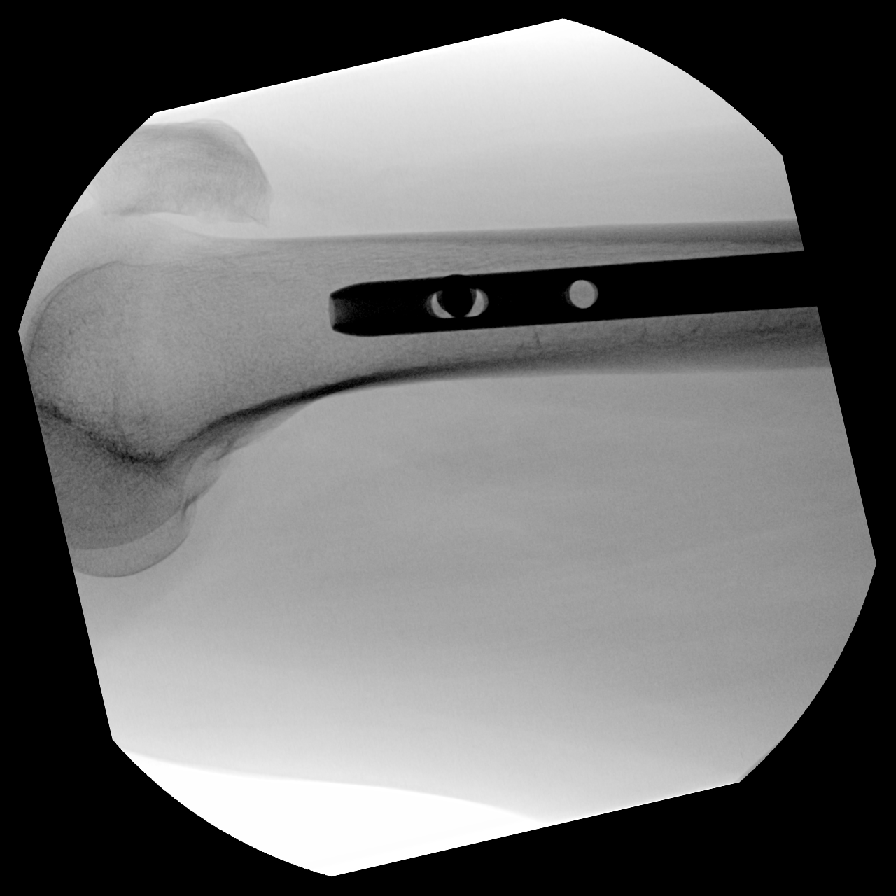
[im 4/4]
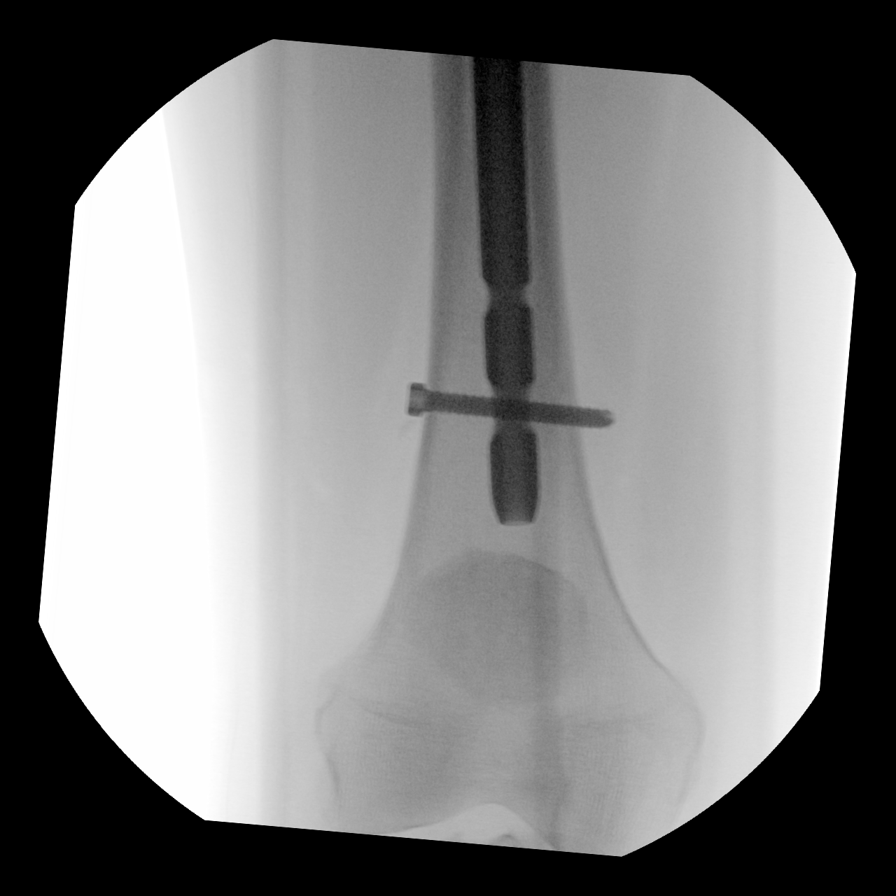

[4 of 4 positions shown; findings below may reference images not displayed]

FINDINGS: Provided images demonstrate placement of a hip screw and long
intramedullary nail with a single distal screw. Hardware is intact.
No acute abnormality is identified.
IMPRESSION: Intraoperative imaging for prophylactic placement a right hip screw
and intramedullary nail.

## 2021-04-01 IMAGING — DX DG HIP (WITH OR WITHOUT PELVIS) 1V PORT*R*
2 series · 2 of 2 positions shown · non-contrast
Comparison: MRI [DATE]

CLINICAL DATA: Postoperative right femoral IM nail placement

EXAM:
DG HIP (WITH OR WITHOUT PELVIS) 1V PORT RIGHT; PORTABLE PELVIS 1-2
VIEWS

[femur ap proximal (1 of 2)]
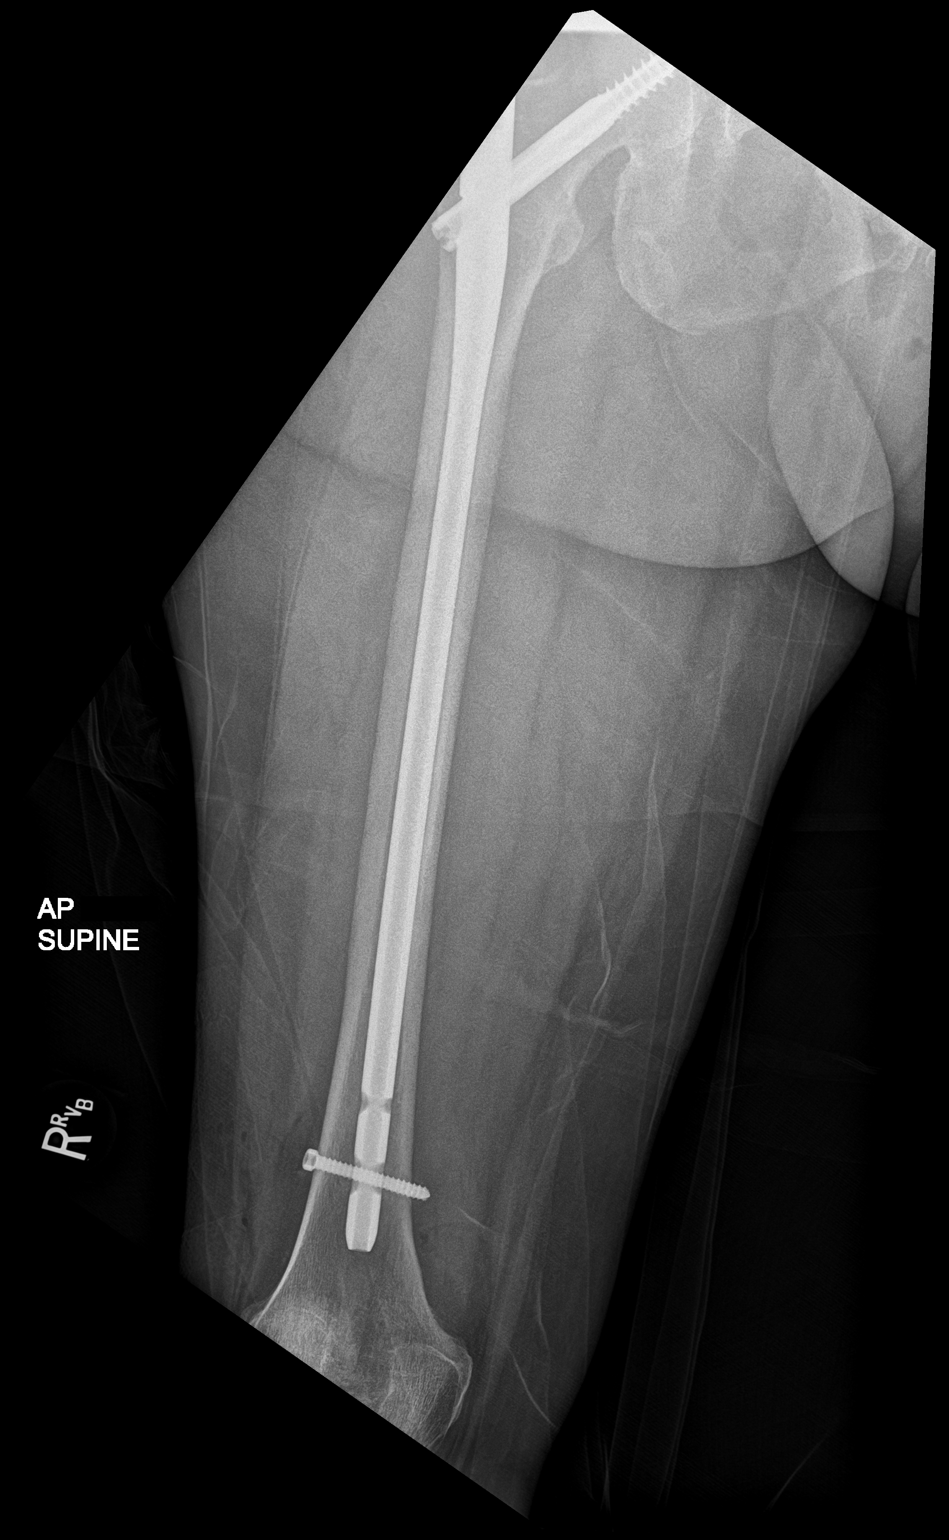

[femur ap proximal (2 of 2)]
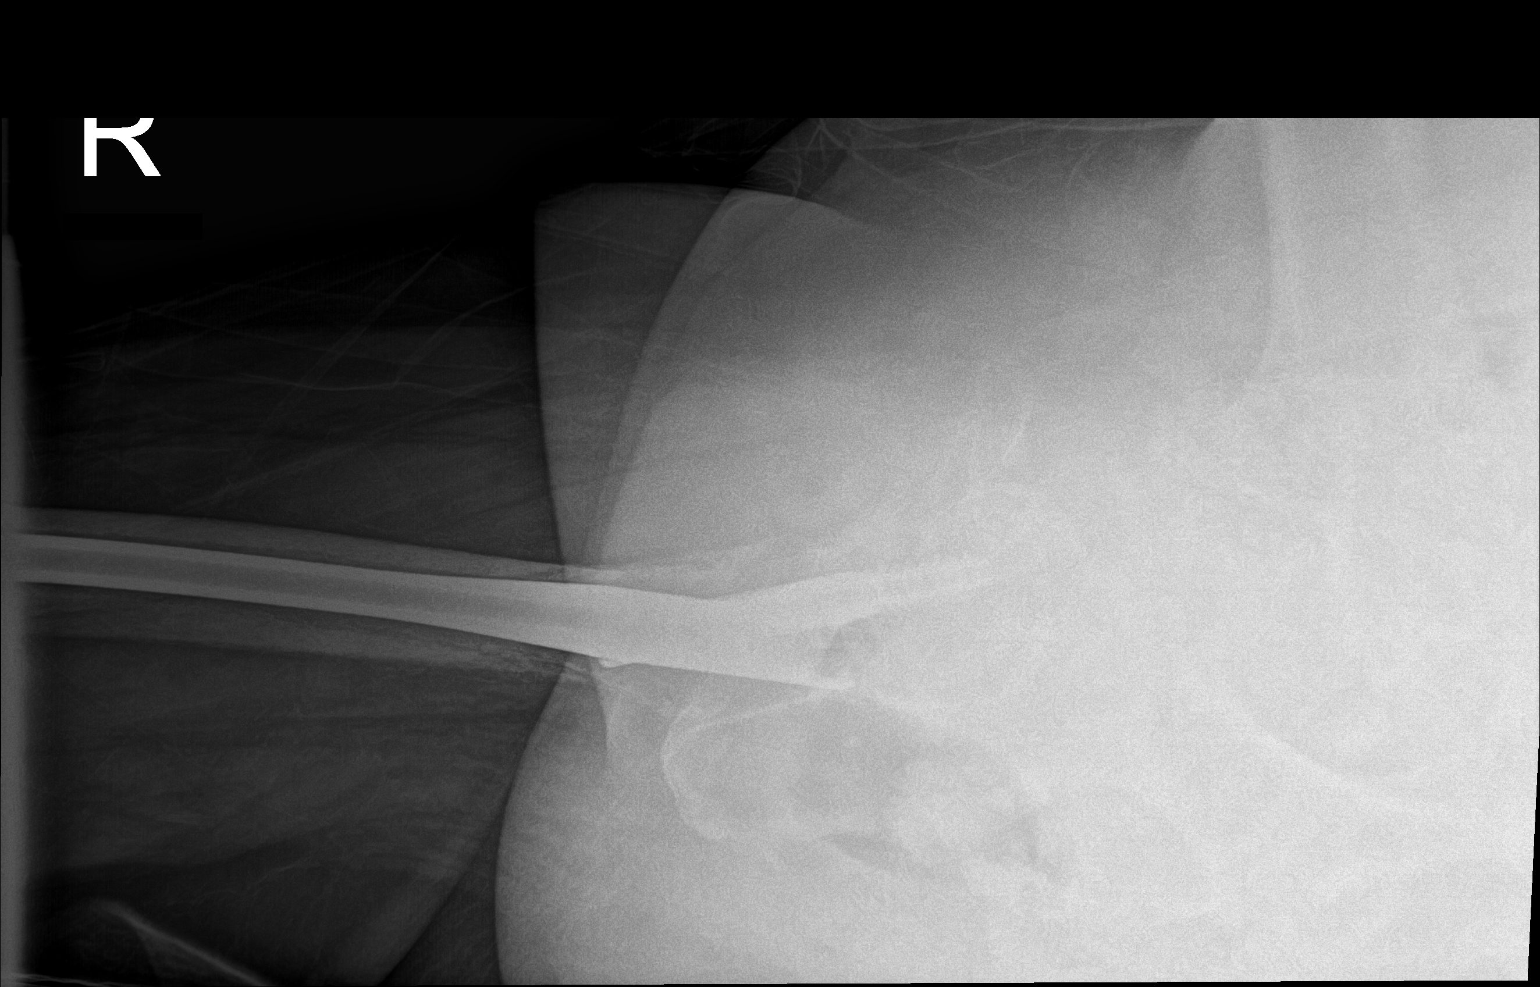

[2 of 2 positions shown; findings below may reference images not displayed]

FINDINGS: Interval postsurgical changes to the right femur with long
intramedullary rod, distal interlocking screw, and proximal lag
screw. No perihardware fracture is seen. There is somewhat
permeative appearance of the right femoral neck and
intertrochanteric region compatible with known marrow replacing bone
lesion better seen on recent MRI. Expected postoperative changes
within the overlying soft tissues. Additional known bony lesions
within the pelvis are not well seen radiographically.
IMPRESSION: Interval postsurgical changes to the right femur without evidence of
hardware complication.

## 2021-04-01 SURGERY — INSERTION, INTRAMEDULLARY ROD, FEMUR
Anesthesia: Monitor Anesthesia Care | Laterality: Right

## 2021-04-01 MED ORDER — OXYCODONE HCL 5 MG/5ML PO SOLN: 5.0000 mg | Freq: Once | ORAL | Status: DC | PRN

## 2021-04-01 MED ORDER — SODIUM CHLORIDE 0.9 % IV SOLN
2.0000 g | INTRAVENOUS | Status: AC
  Administered 2021-04-01: 2 g via INTRAVENOUS
  Filled 2021-04-01: qty 2

## 2021-04-01 MED ORDER — ONDANSETRON HCL 4 MG/2ML IJ SOLN
INTRAMUSCULAR | Status: AC
  Filled 2021-04-01: qty 2

## 2021-04-01 MED ORDER — FENTANYL CITRATE (PF) 250 MCG/5ML IJ SOLN
INTRAMUSCULAR | Status: AC
  Filled 2021-04-01: qty 5

## 2021-04-01 MED ORDER — DOCUSATE SODIUM 100 MG PO CAPS
100.0000 mg | ORAL_CAPSULE | Freq: Two times a day (BID) | ORAL | Status: DC
  Administered 2021-04-01 – 2021-04-02 (×2): 100 mg via ORAL
  Filled 2021-04-01 (×2): qty 1

## 2021-04-01 MED ORDER — METHOCARBAMOL 500 MG IVPB - SIMPLE MED
INTRAVENOUS | Status: AC
  Administered 2021-04-01: 500 mg
  Filled 2021-04-01: qty 50

## 2021-04-01 MED ORDER — ACETAMINOPHEN 500 MG PO TABS
1000.0000 mg | ORAL_TABLET | Freq: Four times a day (QID) | ORAL | Status: AC
  Administered 2021-04-01 – 2021-04-02 (×4): 1000 mg via ORAL
  Filled 2021-04-01 (×4): qty 2

## 2021-04-01 MED ORDER — ACETAMINOPHEN 500 MG PO TABS
1000.0000 mg | ORAL_TABLET | Freq: Once | ORAL | Status: AC
  Administered 2021-04-01: 1000 mg via ORAL
  Filled 2021-04-01: qty 2

## 2021-04-01 MED ORDER — DEXAMETHASONE SODIUM PHOSPHATE 10 MG/ML IJ SOLN
INTRAMUSCULAR | Status: AC
  Filled 2021-04-01: qty 1

## 2021-04-01 MED ORDER — PROPOFOL 10 MG/ML IV BOLUS
INTRAVENOUS | Status: AC
  Filled 2021-04-01: qty 20

## 2021-04-01 MED ORDER — PROPOFOL 10 MG/ML IV BOLUS
INTRAVENOUS | Status: DC | PRN
  Administered 2021-04-01: 40 mg via INTRAVENOUS
  Administered 2021-04-01: 30 mg via INTRAVENOUS

## 2021-04-01 MED ORDER — LIDOCAINE 2% (20 MG/ML) 5 ML SYRINGE
INTRAMUSCULAR | Status: AC
  Filled 2021-04-01: qty 5

## 2021-04-01 MED ORDER — POVIDONE-IODINE 10 % EX SWAB
2.0000 "application " | Freq: Once | CUTANEOUS | Status: AC
  Administered 2021-04-01: 2 via TOPICAL

## 2021-04-01 MED ORDER — PROMETHAZINE HCL 25 MG/ML IJ SOLN
INTRAMUSCULAR | Status: AC
  Filled 2021-04-01: qty 1

## 2021-04-01 MED ORDER — OXYCODONE HCL 5 MG PO TABS
5.0000 mg | ORAL_TABLET | Freq: Once | ORAL | Status: AC | PRN
  Administered 2021-04-01: 5 mg via ORAL

## 2021-04-01 MED ORDER — PHENYLEPHRINE 40 MCG/ML (10ML) SYRINGE FOR IV PUSH (FOR BLOOD PRESSURE SUPPORT)
PREFILLED_SYRINGE | INTRAVENOUS | Status: DC | PRN
  Administered 2021-04-01 (×8): 80 ug via INTRAVENOUS
  Administered 2021-04-01 (×2): 40 ug via INTRAVENOUS
  Administered 2021-04-01: 80 ug via INTRAVENOUS

## 2021-04-01 MED ORDER — OXYCODONE HCL 5 MG/5ML PO SOLN
5.0000 mg | Freq: Once | ORAL | Status: AC | PRN
Start: 2021-04-01 — End: 2021-04-01

## 2021-04-01 MED ORDER — BUPIVACAINE HCL (PF) 0.5 % IJ SOLN
INTRAMUSCULAR | Status: DC | PRN
  Administered 2021-04-01: 1.8 mL

## 2021-04-01 MED ORDER — FERROUS SULFATE 325 (65 FE) MG PO TABS
325.0000 mg | ORAL_TABLET | Freq: Three times a day (TID) | ORAL | Status: DC
  Administered 2021-04-02: 325 mg via ORAL
  Filled 2021-04-01 (×2): qty 1

## 2021-04-01 MED ORDER — CHLORHEXIDINE GLUCONATE 0.12 % MT SOLN
15.0000 mL | Freq: Once | OROMUCOSAL | Status: AC
  Administered 2021-04-01: 15 mL via OROMUCOSAL

## 2021-04-01 MED ORDER — DEXAMETHASONE SODIUM PHOSPHATE 10 MG/ML IJ SOLN
8.0000 mg | Freq: Once | INTRAMUSCULAR | Status: AC
  Administered 2021-04-01: 8 mg via INTRAVENOUS

## 2021-04-01 MED ORDER — METHOCARBAMOL 500 MG PO TABS
500.0000 mg | ORAL_TABLET | Freq: Four times a day (QID) | ORAL | Status: DC | PRN
  Administered 2021-04-01 – 2021-04-02 (×4): 500 mg via ORAL
  Filled 2021-04-01 (×4): qty 1

## 2021-04-01 MED ORDER — LACTATED RINGERS IV SOLN: INTRAVENOUS | Status: DC

## 2021-04-01 MED ORDER — MIDAZOLAM HCL 5 MG/5ML IJ SOLN
INTRAMUSCULAR | Status: DC | PRN
  Administered 2021-04-01: 2 mg via INTRAVENOUS

## 2021-04-01 MED ORDER — PROMETHAZINE HCL 25 MG/ML IJ SOLN
6.2500 mg | INTRAMUSCULAR | Status: DC | PRN
  Administered 2021-04-01: 12.5 mg via INTRAVENOUS

## 2021-04-01 MED ORDER — FENTANYL CITRATE (PF) 100 MCG/2ML IJ SOLN
INTRAMUSCULAR | Status: AC
  Filled 2021-04-01: qty 2

## 2021-04-01 MED ORDER — ALUM & MAG HYDROXIDE-SIMETH 200-200-20 MG/5ML PO SUSP: 30.0000 mL | ORAL | Status: DC | PRN

## 2021-04-01 MED ORDER — BISACODYL 10 MG RE SUPP: 10.0000 mg | Freq: Every day | RECTAL | Status: DC | PRN

## 2021-04-01 MED ORDER — OXYCODONE HCL 5 MG PO TABS: 5.0000 mg | ORAL_TABLET | Freq: Once | ORAL | Status: DC | PRN

## 2021-04-01 MED ORDER — ONDANSETRON HCL 4 MG/2ML IJ SOLN: 4.0000 mg | Freq: Four times a day (QID) | INTRAMUSCULAR | Status: DC | PRN

## 2021-04-01 MED ORDER — PROPOFOL 1000 MG/100ML IV EMUL
INTRAVENOUS | Status: AC
  Filled 2021-04-01: qty 100

## 2021-04-01 MED ORDER — SODIUM CHLORIDE 0.9 % IR SOLN
Status: DC | PRN
  Administered 2021-04-01: 1000 mL

## 2021-04-01 MED ORDER — OXYCODONE HCL 5 MG PO TABS
5.0000 mg | ORAL_TABLET | ORAL | Status: DC | PRN
  Administered 2021-04-02: 10 mg via ORAL
  Filled 2021-04-01 (×2): qty 2

## 2021-04-01 MED ORDER — HYDROMORPHONE HCL 1 MG/ML IJ SOLN
INTRAMUSCULAR | Status: AC
  Filled 2021-04-01: qty 1

## 2021-04-01 MED ORDER — SENNA 8.6 MG PO TABS
1.0000 | ORAL_TABLET | Freq: Two times a day (BID) | ORAL | Status: DC
  Administered 2021-04-01 – 2021-04-02 (×2): 8.6 mg via ORAL
  Filled 2021-04-01 (×2): qty 1

## 2021-04-01 MED ORDER — SODIUM CHLORIDE 0.9 % IV SOLN
2.0000 g | Freq: Four times a day (QID) | INTRAVENOUS | Status: AC
  Administered 2021-04-01 – 2021-04-02 (×2): 2 g via INTRAVENOUS
  Filled 2021-04-01 (×3): qty 2

## 2021-04-01 MED ORDER — POTASSIUM CHLORIDE IN NACL 20-0.45 MEQ/L-% IV SOLN
INTRAVENOUS | Status: DC
  Filled 2021-04-01 (×2): qty 1000

## 2021-04-01 MED ORDER — ASPIRIN EC 81 MG PO TBEC
81.0000 mg | DELAYED_RELEASE_TABLET | Freq: Two times a day (BID) | ORAL | Status: DC
  Administered 2021-04-02: 81 mg via ORAL
  Filled 2021-04-01: qty 1

## 2021-04-01 MED ORDER — ONDANSETRON HCL 4 MG/2ML IJ SOLN
INTRAMUSCULAR | Status: DC | PRN
  Administered 2021-04-01: 4 mg via INTRAVENOUS

## 2021-04-01 MED ORDER — ACETAMINOPHEN 500 MG PO TABS: 1000.0000 mg | ORAL_TABLET | Freq: Once | ORAL | Status: DC | PRN

## 2021-04-01 MED ORDER — ACETAMINOPHEN 160 MG/5ML PO SOLN: 1000.0000 mg | Freq: Once | ORAL | Status: DC | PRN

## 2021-04-01 MED ORDER — FENTANYL CITRATE (PF) 100 MCG/2ML IJ SOLN
INTRAMUSCULAR | Status: DC | PRN
  Administered 2021-04-01 (×3): 50 ug via INTRAVENOUS

## 2021-04-01 MED ORDER — ALPRAZOLAM 0.25 MG PO TABS
0.2500 mg | ORAL_TABLET | Freq: Every evening | ORAL | Status: DC | PRN
  Administered 2021-04-02: 0.25 mg via ORAL
  Filled 2021-04-01: qty 1

## 2021-04-01 MED ORDER — MIDAZOLAM HCL 2 MG/2ML IJ SOLN
INTRAMUSCULAR | Status: AC
  Filled 2021-04-01: qty 2

## 2021-04-01 MED ORDER — POLYETHYLENE GLYCOL 3350 17 G PO PACK: 17.0000 g | PACK | Freq: Every day | ORAL | Status: DC | PRN

## 2021-04-01 MED ORDER — OXYCODONE HCL 5 MG PO TABS
ORAL_TABLET | ORAL | Status: AC
  Filled 2021-04-01: qty 1

## 2021-04-01 MED ORDER — HYDROMORPHONE HCL 1 MG/ML IJ SOLN
0.5000 mg | INTRAMUSCULAR | Status: DC | PRN
  Administered 2021-04-01 – 2021-04-02 (×2): 1 mg via INTRAVENOUS
  Filled 2021-04-01: qty 1

## 2021-04-01 MED ORDER — MAGNESIUM CITRATE PO SOLN: 1.0000 | Freq: Once | ORAL | Status: DC | PRN

## 2021-04-01 MED ORDER — FENTANYL CITRATE (PF) 100 MCG/2ML IJ SOLN: 25.0000 ug | INTRAMUSCULAR | Status: DC | PRN

## 2021-04-01 MED ORDER — ACETAMINOPHEN 10 MG/ML IV SOLN: 1000.0000 mg | Freq: Once | INTRAVENOUS | Status: DC | PRN

## 2021-04-01 MED ORDER — METHOCARBAMOL 500 MG IVPB - SIMPLE MED
500.0000 mg | Freq: Four times a day (QID) | INTRAVENOUS | Status: DC | PRN
  Filled 2021-04-01: qty 50

## 2021-04-01 MED ORDER — PROPOFOL 500 MG/50ML IV EMUL
INTRAVENOUS | Status: DC | PRN
  Administered 2021-04-01: 75 ug/kg/min via INTRAVENOUS

## 2021-04-01 MED ORDER — ACETAMINOPHEN 325 MG PO TABS: 325.0000 mg | ORAL_TABLET | Freq: Four times a day (QID) | ORAL | Status: DC | PRN

## 2021-04-01 MED ORDER — ORAL CARE MOUTH RINSE: 15.0000 mL | Freq: Once | OROMUCOSAL | Status: AC

## 2021-04-01 MED ORDER — FENTANYL CITRATE (PF) 100 MCG/2ML IJ SOLN
25.0000 ug | INTRAMUSCULAR | Status: DC | PRN
  Administered 2021-04-01 (×3): 50 ug via INTRAVENOUS

## 2021-04-01 MED ORDER — TRANEXAMIC ACID-NACL 1000-0.7 MG/100ML-% IV SOLN
1000.0000 mg | INTRAVENOUS | Status: AC
  Administered 2021-04-01: 1000 mg via INTRAVENOUS
  Filled 2021-04-01: qty 100

## 2021-04-01 MED ORDER — PHENOL 1.4 % MT LIQD: 1.0000 | OROMUCOSAL | Status: DC | PRN

## 2021-04-01 MED ORDER — EPHEDRINE SULFATE-NACL 50-0.9 MG/10ML-% IV SOSY
PREFILLED_SYRINGE | INTRAVENOUS | Status: DC | PRN
  Administered 2021-04-01: 10 mg via INTRAVENOUS
  Administered 2021-04-01 (×5): 5 mg via INTRAVENOUS
  Administered 2021-04-01: 10 mg via INTRAVENOUS

## 2021-04-01 MED ORDER — OXYCODONE HCL 5 MG PO TABS
10.0000 mg | ORAL_TABLET | ORAL | Status: DC | PRN
  Administered 2021-04-01 – 2021-04-02 (×4): 10 mg via ORAL
  Filled 2021-04-01 (×3): qty 2

## 2021-04-01 MED ORDER — TRANEXAMIC ACID-NACL 1000-0.7 MG/100ML-% IV SOLN
1000.0000 mg | Freq: Once | INTRAVENOUS | Status: AC
  Administered 2021-04-01: 1000 mg via INTRAVENOUS
  Filled 2021-04-01: qty 100

## 2021-04-01 MED ORDER — ONDANSETRON HCL 4 MG PO TABS
4.0000 mg | ORAL_TABLET | Freq: Four times a day (QID) | ORAL | Status: DC | PRN
  Administered 2021-04-01: 4 mg via ORAL
  Filled 2021-04-01: qty 1

## 2021-04-01 MED ORDER — MENTHOL 3 MG MT LOZG: 1.0000 | LOZENGE | OROMUCOSAL | Status: DC | PRN

## 2021-04-01 MED ORDER — PHENYLEPHRINE 40 MCG/ML (10ML) SYRINGE FOR IV PUSH (FOR BLOOD PRESSURE SUPPORT)
PREFILLED_SYRINGE | INTRAVENOUS | Status: AC
  Filled 2021-04-01: qty 30

## 2021-04-01 SURGICAL SUPPLY — 38 items
BAG COUNTER SPONGE SURGICOUNT (BAG) ×2 IMPLANT
BIT DRILL AO GAMMA 4.2X180 (BIT) ×2 IMPLANT
COVER MAYO STAND STRL (DRAPES) ×2 IMPLANT
COVER PERINEAL POST (MISCELLANEOUS) ×2 IMPLANT
COVER SURGICAL LIGHT HANDLE (MISCELLANEOUS) ×2 IMPLANT
DRAPE STERI IOBAN 125X83 (DRAPES) ×2 IMPLANT
DRESSING MEPILEX FLEX 4X4 (GAUZE/BANDAGES/DRESSINGS) ×3 IMPLANT
DRSG MEPILEX FLEX 4X4 (GAUZE/BANDAGES/DRESSINGS) ×6
DURAPREP 26ML APPLICATOR (WOUND CARE) ×2 IMPLANT
ELECT REM PT RETURN 15FT ADLT (MISCELLANEOUS) ×2 IMPLANT
GLOVE SRG 8 PF TXTR STRL LF DI (GLOVE) ×1 IMPLANT
GLOVE SURG ENC MOIS LTX SZ7.5 (GLOVE) ×2 IMPLANT
GLOVE SURG POLYISO LF SZ7.5 (GLOVE) ×2 IMPLANT
GLOVE SURG UNDER POLY LF SZ7.5 (GLOVE) ×2 IMPLANT
GLOVE SURG UNDER POLY LF SZ8 (GLOVE) ×1
GOWN STRL REUS W/TWL LRG LVL3 (GOWN DISPOSABLE) ×4 IMPLANT
GOWN STRL REUS W/TWL XL LVL3 (GOWN DISPOSABLE) ×4 IMPLANT
GUIDEROD T2 3X1000 (ROD) ×2 IMPLANT
K-WIRE  3.2X450M STR (WIRE) ×1
K-WIRE 3.2X450M STR (WIRE) ×1
KIT BASIN OR (CUSTOM PROCEDURE TRAY) ×2 IMPLANT
KIT TURNOVER KIT A (KITS) ×2 IMPLANT
KWIRE 3.2X450M STR (WIRE) ×1 IMPLANT
MANIFOLD NEPTUNE II (INSTRUMENTS) ×2 IMPLANT
NAIL GAMMA LG R 5TI 10X360X125 (Nail) ×2 IMPLANT
NS IRRIG 1000ML POUR BTL (IV SOLUTION) ×2 IMPLANT
PACK GENERAL/GYN (CUSTOM PROCEDURE TRAY) ×2 IMPLANT
PAD ARMBOARD 7.5X6 YLW CONV (MISCELLANEOUS) ×4 IMPLANT
REAMER SHAFT BIXCUT (INSTRUMENTS) ×2 IMPLANT
SCREW LAG GAMMA 3 TI 10.5X90MM (Screw) ×2 IMPLANT
SCREW LOCKING T2 F/T  5X42.5MM (Screw) ×1 IMPLANT
SCREW LOCKING T2 F/T 5X42.5MM (Screw) ×1 IMPLANT
SUT MNCRL AB 4-0 PS2 18 (SUTURE) ×2 IMPLANT
SUT VIC AB 2-0 CT1 27 (SUTURE) ×1
SUT VIC AB 2-0 CT1 TAPERPNT 27 (SUTURE) ×1 IMPLANT
TOWEL OR 17X26 10 PK STRL BLUE (TOWEL DISPOSABLE) ×2 IMPLANT
TOWEL OR NON WOVEN STRL DISP B (DISPOSABLE) ×2 IMPLANT
WATER STERILE IRR 1000ML POUR (IV SOLUTION) ×2 IMPLANT

## 2021-04-01 NOTE — Anesthesia Procedure Notes (Signed)
Spinal  Patient location during procedure: OR Start time: 04/01/2021 1:10 PM End time: 04/01/2021 1:14 PM Reason for block: surgical anesthesia Staffing Performed: resident/CRNA  Anesthesiologist: Oleta Mouse, MD Resident/CRNA: Gwyndolyn Saxon, CRNA Preanesthetic Checklist Completed: patient identified, IV checked, site marked, risks and benefits discussed, surgical consent, monitors and equipment checked, pre-op evaluation and timeout performed Spinal Block Patient position: sitting Prep: DuraPrep Patient monitoring: heart rate, continuous pulse ox and blood pressure Location: L3-4 Injection technique: single-shot Needle Needle type: Pencan  Needle gauge: 24 G Needle length: 10 cm Assessment Sensory level: T4 Events: CSF return

## 2021-04-01 NOTE — Op Note (Signed)
DATE OF SURGERY:  04/01/2021  TIME: 2:22 PM  PATIENT NAME:  Natalie Griffin  AGE: 45 y.o.  PRE-OPERATIVE DIAGNOSIS:  IMPENDING RIGHT HIP FRACTURE  POST-OPERATIVE DIAGNOSIS:  SAME  PROCEDURE:  INTRAMEDULLARY (IM) NAIL FEMORAL  SURGEON:  [name]  ASSISTANT: Merlene Pulling, PA-C, he was present and scrubbed throughout the case, critical for completion in a timely fashion, and for retraction, instrumentation, and closure.   OPERATIVE IMPLANTS: Stryker Gamma Nail  PREOPERATIVE INDICATIONS:  Natalie Griffin is a 45 y.o. year old who fell and suffered a hip fracture. She was brought into the ER and then admitted and optimized and then elected for surgical intervention.    The risks benefits and alternatives were discussed with the patient including but not limited to the risks of nonoperative treatment, versus surgical intervention including infection, bleeding, nerve injury, malunion, nonunion, hardware prominence, hardware failure, need for hardware removal, blood clots, cardiopulmonary complications, morbidity, mortality, among others, and they were willing to proceed.    OPERATIVE PROCEDURE:  The patient was brought to the operating room and placed in the supine position. General anesthesia was administered. She was placed on the fracture table.  Time out was then performed after sterile prep and drape. She received preoperative antibiotics.  Incision was made proximal to the greater trochanter. A guidewire was placed in the appropriate position. Confirmation was made on AP and lateral views. The above-named nail was opened. I opened the proximal femur with a reamer. I then placed the nail by hand easily down. I did not need to ream the femur.  Once the nail was completely seated, I placed a guidepin into the femoral head into the center center position. I measured the length, and then reamed the lateral cortex and up into the head. I then placed the lag screw. Slight  compression was applied. Anatomic fixation achieved. Reamings were sent to pathology  I then secured the proximal interlocking bolt, and took off a half a turn, and then removed the instruments, and took final C-arm pictures AP and lateral the entire length of the leg.  I then used perfect circles technique to place a distal interlock screw.   Anatomic reconstruction was achieved, and the wounds were irrigated copiously and closed with Vicryl followed by staples and sterile gauze for the skin. The patient was awakened and returned to PACU in stable and satisfactory condition. There no complications and the patient tolerated the procedure well.  She will be weightbearing as tolerated, and will be on chemical px  for a period of four weeks after discharge.   Edmonia Lynch, M.D.

## 2021-04-01 NOTE — Anesthesia Preprocedure Evaluation (Addendum)
Anesthesia Evaluation  Patient identified by MRN, date of birth, ID band Patient awake    Reviewed: Allergy & Precautions, NPO status , Patient's Chart, lab work & pertinent test results  History of Anesthesia Complications Negative for: history of anesthetic complications  Airway Mallampati: II  TM Distance: >3 FB Neck ROM: Full    Dental  (+) Dental Advisory Given   Pulmonary former smoker,    breath sounds clear to auscultation       Cardiovascular negative cardio ROS   Rhythm:Regular     Neuro/Psych negative neurological ROS  negative psych ROS   GI/Hepatic Neg liver ROS, hiatal hernia,   Endo/Other  negative endocrine ROS  Renal/GU negative Renal ROS     Musculoskeletal Impending right hip fracture   Abdominal   Peds  Hematology negative hematology ROS (+)   Anesthesia Other Findings Breast Ca  Reproductive/Obstetrics                            Anesthesia Physical Anesthesia Plan  ASA: 2  Anesthesia Plan: MAC and Spinal   Post-op Pain Management:    Induction:   PONV Risk Score and Plan: 2 and Propofol infusion  Airway Management Planned: Nasal Cannula  Additional Equipment: None  Intra-op Plan:   Post-operative Plan:   Informed Consent: I have reviewed the patients History and Physical, chart, labs and discussed the procedure including the risks, benefits and alternatives for the proposed anesthesia with the patient or authorized representative who has indicated his/her understanding and acceptance.     Dental advisory given  Plan Discussed with: CRNA and Surgeon  Anesthesia Plan Comments:         Anesthesia Quick Evaluation

## 2021-04-01 NOTE — Transfer of Care (Signed)
Immediate Anesthesia Transfer of Care Note  Patient: Natalie Griffin  Procedure(s) Performed: INTRAMEDULLARY (IM) NAIL FEMORAL (Right)  Patient Location: PACU  Anesthesia Type:MAC and Spinal  Level of Consciousness: awake, alert  and oriented  Airway & Oxygen Therapy: Patient Spontanous Breathing  Post-op Assessment: Report given to RN and Post -op Vital signs reviewed and stable  Post vital signs: Reviewed and stable  Last Vitals:  Vitals Value Taken Time  BP 99/60 04/01/21 1422  Temp    Pulse 65 04/01/21 1426  Resp 10 04/01/21 1426  SpO2 99 % 04/01/21 1426  Vitals shown include unvalidated device data.  Last Pain:  Vitals:   04/01/21 1004  TempSrc:   PainSc: 2       Patients Stated Pain Goal: 1 (99/83/38 2505)  Complications: No notable events documented.

## 2021-04-01 NOTE — Interval H&P Note (Signed)
History and Physical Interval Note:  04/01/2021 9:38 AM  Natalie Griffin  has presented today for surgery, with the diagnosis of IMPENDING RIGHT HIP FRACTURE.  The various methods of treatment have been discussed with the patient and family. After consideration of risks, benefits and other options for treatment, the patient has consented to  Procedure(s): INTRAMEDULLARY (IM) NAIL FEMORAL (Right) as a surgical intervention.  The patient's history has been reviewed, patient examined, no change in status, stable for surgery.  I have reviewed the patient's chart and labs.  Questions were answered to the patient's satisfaction.     [name]

## 2021-04-02 ENCOUNTER — Encounter (HOSPITAL_COMMUNITY): Payer: Self-pay | Admitting: Orthopedic Surgery

## 2021-04-02 LAB — BASIC METABOLIC PANEL
Anion gap: 11 (ref 5–15)
BUN: 12 mg/dL (ref 6–20)
CO2: 20 mmol/L — ABNORMAL LOW (ref 22–32)
Calcium: 9.4 mg/dL (ref 8.9–10.3)
Chloride: 106 mmol/L (ref 98–111)
Creatinine, Ser: 0.88 mg/dL (ref 0.44–1.00)
GFR, Estimated: >60 mL/min (ref >60)
Glucose, Bld: 134 mg/dL — ABNORMAL HIGH (ref 70–99)
Potassium: 4.5 mmol/L (ref 3.5–5.1)
Sodium: 137 mmol/L (ref 135–145)

## 2021-04-02 LAB — CBC
HCT: 33.3 % — ABNORMAL LOW (ref 36.0–46.0)
Hemoglobin: 10.5 g/dL — ABNORMAL LOW (ref 12.0–15.0)
MCH: 28.8 pg (ref 26.0–34.0)
MCHC: 31.5 g/dL (ref 30.0–36.0)
MCV: 91.2 fL (ref 80.0–100.0)
Platelets: 287 10*3/uL (ref 150–400)
RBC: 3.65 MIL/uL — ABNORMAL LOW (ref 3.87–5.11)
RDW: 13.2 % (ref 11.5–15.5)
WBC: 15.1 10*3/uL — ABNORMAL HIGH (ref 4.0–10.5)
nRBC: 0 % (ref 0.0–0.2)

## 2021-04-02 MED ORDER — METHOCARBAMOL 500 MG PO TABS: 500.0000 mg | ORAL_TABLET | Freq: Three times a day (TID) | ORAL | 1 refills | Status: DC | PRN

## 2021-04-02 MED ORDER — OMEPRAZOLE MAGNESIUM 20 MG PO TBEC: 20.0000 mg | DELAYED_RELEASE_TABLET | Freq: Every day | ORAL | 1 refills | Status: DC

## 2021-04-02 MED ORDER — ASPIRIN EC 81 MG PO TBEC: 81.0000 mg | DELAYED_RELEASE_TABLET | Freq: Two times a day (BID) | ORAL | 0 refills | Status: DC

## 2021-04-02 MED ORDER — ACETAMINOPHEN 500 MG PO TABS: 1000.0000 mg | ORAL_TABLET | Freq: Three times a day (TID) | ORAL | 2 refills | Status: DC | PRN

## 2021-04-02 MED ORDER — OXYCODONE HCL 10 MG PO TABS: 10.0000 mg | ORAL_TABLET | ORAL | 0 refills | Status: DC | PRN

## 2021-04-02 MED ORDER — MELOXICAM 15 MG PO TABS: 15.0000 mg | ORAL_TABLET | Freq: Every day | ORAL | 0 refills | Status: DC

## 2021-04-02 MED ORDER — ONDANSETRON HCL 4 MG PO TABS: 4.0000 mg | ORAL_TABLET | Freq: Three times a day (TID) | ORAL | 0 refills | Status: DC | PRN

## 2021-04-02 NOTE — Discharge Instructions (Signed)
Diet: As you were doing prior to hospitalization   Shower:  May shower but keep the wounds dry, use an occlusive plastic wrap, NO SOAKING IN TUB.  If the bandage gets wet, change with a clean dry gauze.  If you have a splint on, leave the splint in place and keep the splint dry with a plastic bag.  Dressing:  You may change your dressing 3-5 days after surgery. ,  Activity:  Increase activity slowly as tolerated, but follow the weight bearing instructions below.  The rules on driving is that you can not be taking narcotics while you drive, and you must feel in control of the vehicle.    Weight Bearing:   weight bearing as tolerated  To prevent constipation: you may use a stool softener such as -  Colace (over the counter) 100 mg by mouth twice a day  Drink plenty of fluids (prune juice may be helpful) and high fiber foods Miralax (over the counter) for constipation as needed.    Itching:  If you experience itching with your medications, try taking only a single pain pill, or even half a pain pill at a time.  You may take up to 10 pain pills per day, and you can also use benadryl over the counter for itching or also to help with sleep.   Precautions:  If you experience chest pain or shortness of breath - call 911 immediately for transfer to the hospital emergency department!!  If you develop a fever greater that 101 F, purulent drainage from wound, increased redness or drainage from wound, or calf pain -- Call the office at [phone]                                                Follow- Up Appointment:  Please call for an appointment to be seen in 2 weeks [place] - [phone]

## 2021-04-02 NOTE — TOC Transition Note (Signed)
Transition of Care [facility]) - CM/SW Discharge Note  Patient Details  Name: Natalie Griffin MRN: [id number] Date of Birth: 1976/03/10  Transition of Care [facility]) CM/SW Contact:  Sherie Don, LCSW Phone Number: 04/02/2021, 1:25 PM  Clinical Narrative: PT evaluation recommended HHPT and a rolling walker. Patient agreeable to referrals. Tommi Rumps with [facility] accepted HHPT referral. DME referral made to [facility] with [employer]. CSW updated patient. TOC signing off.  Final next level of care: [place] Barriers to Discharge: No Barriers Identified  Patient Goals and CMS Choice Patient states their goals for this hospitalization and ongoing recovery are:: Discharge home with [place] CMS Medicare.gov Compare Post Acute Care list provided to:: Patient Choice offered to / list presented to : Patient  Discharge Plan and Services          DME Arranged: Walker rolling DME Agency: [employer] Date DME Agency Contacted: 04/02/21 Time DME Agency Contacted: 1233 Representative spoke with at DME Agency: [place] Arranged: PT [place]: [place] Date [place]: 04/02/21 Time [place]: 1204 Representative spoke with at [place]: Tommi Rumps  Readmission Risk Interventions No flowsheet data found.

## 2021-04-02 NOTE — Evaluation (Signed)
Physical Therapy Evaluation Patient Details Name: Natalie Griffin MRN: [id number] DOB: 03/18/1976 Today's Date: 04/02/2021   History of Present Illness  45 yo female s/p R hip IM nail 2* pathological fx. Hx of breast cancer with bony mets  Clinical Impression  On eval, pt required Min A for mobility. She walked ~115 feet with a RW. Moderate pain with activity. Will plan to have a 2nd session prior to possible d/c today if cleared by MD. Pt is interested in HHPT f/u and she would like a RW for ambulation safety.     Follow Up Recommendations Home health PT    Equipment Recommendations  Rolling walker with 5" wheels    Recommendations for Other Services       Precautions / Restrictions Precautions Precautions: Fall Restrictions Weight Bearing Restrictions: No Other Position/Activity Restrictions: WBAT      Mobility  Bed Mobility Overal bed mobility: Needs Assistance Bed Mobility: Supine to Sit     Supine to sit: Min assist;HOB elevated     General bed mobility comments: Pt used gait belt to assist R LE. Increased time. Cues provided.    Transfers Overall transfer level: Needs assistance Equipment used: Rolling walker (2 wheeled) Transfers: Sit to/from Stand Sit to Stand: Min assist         General transfer comment: Assist to rise, steady, control descent. Cues for safety, technique, hand/LE placement.  Ambulation/Gait Ambulation/Gait assistance: Min assist Gait Distance (Feet): 115 Feet Assistive device: Rolling walker (2 wheeled) Gait Pattern/deviations: Step-to pattern;Step-through pattern;Decreased stride length;Decreased step length - left     General Gait Details: Cues for safety, technique, sequence, step length. Increased pain with full step through pattern so instructed pt to use either step to pattern or take shorter step length on L  Stairs            Wheelchair Mobility    Modified Rankin (Stroke Patients Only)       Balance Overall  balance assessment: Needs assistance         Standing balance support: Bilateral upper extremity supported Standing balance-Leahy Scale: Fair                               Pertinent Vitals/Pain Pain Assessment: 0-10 Pain Score: 6  Pain Location: R hip Pain Descriptors / Indicators: Discomfort;Sore;Guarding Pain Intervention(s): Limited activity within patient's tolerance;Monitored during session;Repositioned;Ice applied    Home Living Family/patient expects to be discharged to:: Private residence Living Arrangements: Spouse/significant other Available Help at Discharge: Family Type of Home: House Home Access: Stairs to enter Entrance Stairs-Rails: Right Entrance Stairs-Number of Steps: 2 Home Layout: One level Home Equipment: Crutches      Prior Function Level of Independence: Independent               Hand Dominance        Extremity/Trunk Assessment   Upper Extremity Assessment Upper Extremity Assessment: Overall WFL for tasks assessed    Lower Extremity Assessment Lower Extremity Assessment: Generalized weakness    Cervical / Trunk Assessment Cervical / Trunk Assessment: Normal  Communication   Communication: No difficulties  Cognition Arousal/Alertness: Awake/alert Behavior During Therapy: WFL for tasks assessed/performed Overall Cognitive Status: Within Functional Limits for tasks assessed  General Comments      Exercises General Exercises - Lower Extremity Ankle Circles/Pumps: AROM;Both;10 reps Quad Sets: AROM;Both;10 reps Heel Slides: AAROM;Right;10 reps Hip ABduction/ADduction: AAROM;Right;10 reps   Assessment/Plan    PT Assessment Patient needs continued PT services  PT Problem List Decreased strength;Decreased range of motion;Decreased mobility;Decreased activity tolerance;Decreased knowledge of use of DME;Decreased balance;Pain       PT Treatment Interventions DME  instruction;Gait training;Stair training;Functional mobility training;Therapeutic activities;Patient/family education;Balance training;Therapeutic exercise    PT Goals (Current goals can be found in the Care Plan section)  Acute Rehab PT Goals Patient Stated Goal: less pain. regain PLOF PT Goal Formulation: With patient Time For Goal Achievement: 04/16/21 Potential to Achieve Goals: Good    Frequency Min 5X/week   Barriers to discharge        Co-evaluation               AM-PAC PT "6 Clicks" Mobility  Outcome Measure Help needed turning from your back to your side while in a flat bed without using bedrails?: A Little Help needed moving from lying on your back to sitting on the side of a flat bed without using bedrails?: A Little Help needed moving to and from a bed to a chair (including a wheelchair)?: A Little Help needed standing up from a chair using your arms (e.g., wheelchair or bedside chair)?: A Little Help needed to walk in hospital room?: A Little Help needed climbing 3-5 steps with a railing? : A Little 6 Click Score: 18    End of Session Equipment Utilized During Treatment: Gait belt Activity Tolerance: Patient tolerated treatment well Patient left: in chair;with call bell/phone within reach   PT Visit Diagnosis: Muscle weakness (generalized) (M62.81);Other abnormalities of gait and mobility (R26.89);Pain Pain - Right/Left: Right Pain - part of body: Hip    Time: 7078-6754 PT Time Calculation (min) (ACUTE ONLY): 39 min   Charges:   PT Evaluation $PT Eval Moderate Complexity: 1 Mod PT Treatments $Gait Training: 8-22 mins $Therapeutic Exercise: 8-22 mins           Doreatha Massed, PT Acute Rehabilitation  Office: [phone] Pager: [phone]

## 2021-04-02 NOTE — Plan of Care (Signed)

## 2021-04-02 NOTE — Progress Notes (Signed)
Physical Therapy Treatment Patient Details Name: Natalie Griffin MRN: [id number] DOB: 18-Nov-1975 Today's Date: 04/02/2021    History of Present Illness 45 yo female s/p R hip IM nail 2* pathological fx. Hx of breast cancer with bony mets    PT Comments    2nd session to practice stair negotiation. Pt did well with using 1 crutch, 1 rail. Moderate pain during session ~6-7. Pt feels comfortable discharging home today with sister assisting as needed. Okay to d/c from PT standpoint.     Follow Up Recommendations  Home health PT     Equipment Recommendations  Rolling walker with 5" wheels    Recommendations for Other Services       Precautions / Restrictions Precautions Precautions: Fall Restrictions Weight Bearing Restrictions: No Other Position/Activity Restrictions: WBAT    Mobility  Bed Mobility Overal bed mobility: Needs Assistance Bed Mobility: Supine to Sit     Supine to sit: Min assist;HOB elevated     General bed mobility comments: oob in recliner    Transfers Overall transfer level: Needs assistance Equipment used: Rolling walker (2 wheeled) Transfers: Sit to/from Stand Sit to Stand: Min guard         General transfer comment: Min guard for safety. Cues for hand placement  Ambulation/Gait Ambulation/Gait assistance: Min guard Gait Distance (Feet): 75 Feet Assistive device: Rolling walker (2 wheeled) Gait Pattern/deviations: Step-to pattern;Step-through pattern;Decreased stride length;Antalgic;Decreased weight shift to right     General Gait Details: Cues for safety, technique, sequence, step length. Increased pain with full step through pattern so instructed pt to use either step to pattern or take shorter step length on L   Stairs Stairs: Yes Stairs assistance: Min guard Stair Management: Step to pattern;Forwards;One rail Right;With crutches Number of Stairs: 4 General stair comments: up and over portable stairs x 2 with crutches. Attempted  with 2 hands 1 rail-pt unable. Min guard with 1 crutch, 1 rail and cues.   Wheelchair Mobility    Modified Rankin (Stroke Patients Only)       Balance Overall balance assessment: Needs assistance         Standing balance support: Bilateral upper extremity supported Standing balance-Leahy Scale: Fair                              Cognition Arousal/Alertness: Awake/alert Behavior During Therapy: WFL for tasks assessed/performed Overall Cognitive Status: Within Functional Limits for tasks assessed                                        Exercises     General Comments        Pertinent Vitals/Pain Pain Assessment: 0-10 Pain Score: 7  Pain Location: R hip Pain Descriptors / Indicators: Discomfort;Sore;Guarding Pain Intervention(s): Limited activity within patient's tolerance;Monitored during session;Repositioned    Home Living                      Prior Function            PT Goals (current goals can now be found in the care plan section) Acute Rehab PT Goals Patient Stated Goal: less pain. regain PLOF PT Goal Formulation: With patient Time For Goal Achievement: 04/16/21 Potential to Achieve Goals: Good Progress towards PT goals: Progressing toward goals    Frequency    Min 5X/week  PT Plan Current plan remains appropriate    Co-evaluation              AM-PAC PT "6 Clicks" Mobility   Outcome Measure  Help needed turning from your back to your side while in a flat bed without using bedrails?: A Little Help needed moving from lying on your back to sitting on the side of a flat bed without using bedrails?: A Little Help needed moving to and from a bed to a chair (including a wheelchair)?: A Little Help needed standing up from a chair using your arms (e.g., wheelchair or bedside chair)?: A Little Help needed to walk in hospital room?: A Little Help needed climbing 3-5 steps with a railing? : A Little 6  Click Score: 18    End of Session Equipment Utilized During Treatment: Gait belt Activity Tolerance: Patient tolerated treatment well Patient left: in chair;with call bell/phone within reach;with family/visitor present   PT Visit Diagnosis: Other abnormalities of gait and mobility (R26.89);Pain;Muscle weakness (generalized) (M62.81) Pain - Right/Left: Right Pain - part of body: Hip     Time: 7530-1040 PT Time Calculation (min) (ACUTE ONLY): 23 min  Charges:  $Gait Training: 23-37 mins $Therapeutic Exercise: 8-22 mins                         Doreatha Massed, PT Acute Rehabilitation  Office: [phone] Pager: [phone]

## 2021-04-02 NOTE — Progress Notes (Signed)
Subjective: 1 Day Post-Op s/p Procedure(s): INTRAMEDULLARY (IM) NAIL FEMORAL   Patient is alert, oriented. Pain moderately well controlled with current regimen, oxycodone working better than dilaudid. She felt like her temperature rose overnight but this calmed down with medication. No other complaints.  Objective:  PE: VITALS:   Vitals:   04/01/21 2042 04/02/21 0032 04/02/21 0200 04/02/21 0525  BP: 127/80 (!) 108/54 102/64 100/65  Pulse: 92 67  (!) 56  Resp: 18 18  16   Temp: 99.2 F (37.3 C) 99.7 F (37.6 C) 98.7 F (37.1 C) 98.7 F (37.1 C)  TempSrc: Oral Oral  Oral  SpO2: 97% 96%  97%  Weight:      Height:        ABD soft Sensation intact distally Intact pulses distally Dorsiflexion/Plantar flexion intact Incision: scant drainage Compartment soft  LABS  Results for orders placed or performed during the hospital encounter of 04/01/21 (from the past 24 hour(s))  SARS Coronavirus 2 by RT PCR (hospital order, performed in [place] hospital lab) Nasopharyngeal Nasopharyngeal Swab     Status: None   Collection Time: 04/01/21  9:40 AM   Specimen: Nasopharyngeal Swab  Result Value Ref Range   SARS Coronavirus 2 NEGATIVE NEGATIVE  Basic metabolic panel per protocol     Status: Abnormal   Collection Time: 04/01/21 10:00 AM  Result Value Ref Range   Sodium 138 135 - 145 mmol/L   Potassium 3.9 3.5 - 5.1 mmol/L   Chloride 106 98 - 111 mmol/L   CO2 23 22 - 32 mmol/L   Glucose, Bld 112 (H) 70 - 99 mg/dL   BUN 8 6 - 20 mg/dL   Creatinine, Ser 0.84 0.44 - 1.00 mg/dL   Calcium 9.8 8.9 - 10.3 mg/dL   GFR, Estimated >60 >60 mL/min   Anion gap 9 5 - 15  CBC per protocol     Status: Abnormal   Collection Time: 04/01/21 10:00 AM  Result Value Ref Range   WBC 11.9 (H) 4.0 - 10.5 K/uL   RBC 4.06 3.87 - 5.11 MIL/uL   Hemoglobin 11.7 (L) 12.0 - 15.0 g/dL   HCT 36.1 36.0 - 46.0 %   MCV 88.9 80.0 - 100.0 fL   MCH 28.8 26.0 - 34.0 pg   MCHC 32.4 30.0 - 36.0 g/dL   RDW  13.2 11.5 - 15.5 %   Platelets 316 150 - 400 K/uL   nRBC 0.0 0.0 - 0.2 %  hCG, serum, qualitative (Not at [facility])     Status: None   Collection Time: 04/01/21 10:00 AM  Result Value Ref Range   Preg, Serum NEGATIVE NEGATIVE  CBC     Status: Abnormal   Collection Time: 04/02/21  3:03 AM  Result Value Ref Range   WBC 15.1 (H) 4.0 - 10.5 K/uL   RBC 3.65 (L) 3.87 - 5.11 MIL/uL   Hemoglobin 10.5 (L) 12.0 - 15.0 g/dL   HCT 33.3 (L) 36.0 - 46.0 %   MCV 91.2 80.0 - 100.0 fL   MCH 28.8 26.0 - 34.0 pg   MCHC 31.5 30.0 - 36.0 g/dL   RDW 13.2 11.5 - 15.5 %   Platelets 287 150 - 400 K/uL   nRBC 0.0 0.0 - 0.2 %  Basic metabolic panel     Status: Abnormal   Collection Time: 04/02/21  3:03 AM  Result Value Ref Range   Sodium 137 135 - 145 mmol/L   Potassium 4.5 3.5 - 5.1  mmol/L   Chloride 106 98 - 111 mmol/L   CO2 20 (L) 22 - 32 mmol/L   Glucose, Bld 134 (H) 70 - 99 mg/dL   BUN 12 6 - 20 mg/dL   Creatinine, Ser 0.88 0.44 - 1.00 mg/dL   Calcium 9.4 8.9 - 10.3 mg/dL   GFR, Estimated >60 >60 mL/min   Anion gap 11 5 - 15    Pelvis Portable  Result Date: 04/01/2021 CLINICAL DATA:  Postoperative right femoral IM nail placement EXAM: DG HIP (WITH OR WITHOUT PELVIS) 1V PORT RIGHT; PORTABLE PELVIS 1-2 VIEWS COMPARISON:  MRI 03/14/2021 FINDINGS: Interval postsurgical changes to the right femur with long intramedullary rod, distal interlocking screw, and proximal lag screw. No perihardware fracture is seen. There is somewhat permeative appearance of the right femoral neck and intertrochanteric region compatible with known marrow replacing bone lesion better seen on recent MRI. Expected postoperative changes within the overlying soft tissues. Additional known bony lesions within the pelvis are not well seen radiographically. IMPRESSION: Interval postsurgical changes to the right femur without evidence of hardware complication. Electronically Signed   By: Davina Poke D.O.   On: 04/01/2021 15:44   DG  C-Arm 1-60 Min-No Report  Result Date: 04/01/2021 Fluoroscopy was utilized by the requesting physician.  No radiographic interpretation.   DG Hip Port Unilat With Pelvis 1V Right  Result Date: 04/01/2021 CLINICAL DATA:  Postoperative right femoral IM nail placement EXAM: DG HIP (WITH OR WITHOUT PELVIS) 1V PORT RIGHT; PORTABLE PELVIS 1-2 VIEWS COMPARISON:  MRI 03/14/2021 FINDINGS: Interval postsurgical changes to the right femur with long intramedullary rod, distal interlocking screw, and proximal lag screw. No perihardware fracture is seen. There is somewhat permeative appearance of the right femoral neck and intertrochanteric region compatible with known marrow replacing bone lesion better seen on recent MRI. Expected postoperative changes within the overlying soft tissues. Additional known bony lesions within the pelvis are not well seen radiographically. IMPRESSION: Interval postsurgical changes to the right femur without evidence of hardware complication. Electronically Signed   By: Davina Poke D.O.   On: 04/01/2021 15:44   DG HIP OPERATIVE UNILAT W OR W/O PELVIS RIGHT  Result Date: 04/01/2021 CLINICAL DATA:  Intraoperative imaging for prophylactic placement of a right intramedullary nail in a patient with an impending right hip fracture due to metastatic breast cancer. EXAM: OPERATIVE RIGHT HIP (WITH PELVIS IF PERFORMED) 4 VIEWS TECHNIQUE: Fluoroscopic spot image(s) were submitted for interpretation post-operatively. COMPARISON:  Whole-body bone scan 03/26/2021. Plain films right hip 03/11/2021. FINDINGS: Provided images demonstrate placement of a hip screw and long intramedullary nail with a single distal screw. Hardware is intact. No acute abnormality is identified. IMPRESSION: Intraoperative imaging for prophylactic placement a right hip screw and intramedullary nail. Electronically Signed   By: Inge Rise M.D.   On: 04/01/2021 16:45    Assessment/Plan: Right hip pathologic fracture   1 Day Post-Op s/p Procedure(s): INTRAMEDULLARY (IM) NAIL FEMORAL  Weightbearing: WBAT RLE Insicional and dressing care: Reinforce dressings as needed VTE prophylaxis: Aspirin 81mg  BID  x 30 days Pain control: continue current regimen, patient aware that she has oxy 15mg  on board if she needs it rather than dilaudid, she would like to see how she does with PT today before changing pain medication regimen  Follow - up plan: 1 week with Dr. Percell Miller Dispo: pending PT eval  Contact information:   Weekdays 8-5 Merlene Pulling, PA-C [phone] A fter hours and holidays please check [email or web address] for group call information for  [facility]  Ventura Bruns 04/02/2021, 7:57 AM

## 2021-04-02 NOTE — Discharge Summary (Signed)
Discharge Summary  Patient ID: TRANG BOUSE MRN: [id number] DOB/AGE: 1976-05-01 45 y.o.  Admit date: 04/01/2021 Discharge date: 04/02/2021  Admission Diagnoses:  Pathological fracture of right hip  Discharge Diagnoses:  Active Problems:   Pathological fracture of right hip [facility])   Past Medical History:  Diagnosis Date   Anemia    Bone metastasis ([place])    Breast cancer ([place]) 2021   Left breast invasive ductal carcinoma   Cancer ([place]) 2021   Left breast   Family history of adverse reaction to anesthesia    Father and Aunt have pseudocholinesterase deficiency - Trouble waking up from anesthesia   History of hiatal hernia    History of kidney stones    noted on CT Left nonobstructive    Surgeries: Procedure(s): INTRAMEDULLARY (IM) NAIL FEMORAL on 04/01/2021   Consultants (if any):   Discharged Condition: Improved  Hospital Course: JAIANNA NICOLL is an 45 y.o. female who was admitted 04/01/2021 with a diagnosis of pathological fracture of right hip and went to the operating room on 04/01/2021 and underwent the above named procedures.    She was given perioperative antibiotics:  Anti-infectives (From admission, onward)    Start     Dose/Rate Route Frequency Ordered Stop   04/01/21 1900  ceFAZolin (ANCEF) 2 g in sodium chloride 0.9 % 100 mL IVPB        2 g 200 mL/hr over 30 Minutes Intravenous Every 6 hours 04/01/21 1805 04/02/21 0522   04/01/21 0945  ceFAZolin (ANCEF) 2 g in sodium chloride 0.9 % 100 mL IVPB        2 g 200 mL/hr over 30 Minutes Intravenous On call 04/01/21 0940 04/01/21 1345     .  She was given sequential compression devices, early ambulation, and aspirin for DVT prophylaxis.  She benefited maximally from the hospital stay and there were no complications.    Recent vital signs:  Vitals:   04/02/21 1319 04/02/21 1451  BP: (!) 94/52   Pulse: (!) 54   Resp: 18   Temp: 98.5 F (36.9 C)   SpO2: 98% 98%    Recent laboratory studies:  Lab  Results  Component Value Date   HGB 10.5 (L) 04/02/2021   HGB 11.7 (L) 04/01/2021   HGB 12.4 03/26/2021   Lab Results  Component Value Date   WBC 15.1 (H) 04/02/2021   PLT 287 04/02/2021   No results found for: INR Lab Results  Component Value Date   NA 137 04/02/2021   K 4.5 04/02/2021   CL 106 04/02/2021   CO2 20 (L) 04/02/2021   BUN 12 04/02/2021   CREATININE 0.88 04/02/2021   GLUCOSE 134 (H) 04/02/2021    Discharge Medications:   Allergies as of 04/02/2021       Reactions   Adhesive [tape] Other (See Comments)   Blistering with steri strips   Tetanus Toxoid Anaphylaxis   Succinylcholine Other (See Comments)   Her Body does not have enough enzymes to carry it out of her body   Contrast Media [iodinated Diagnostic Agents] Rash   Rash/hives        Medication List     STOP taking these medications    HYDROcodone-acetaminophen 5-325 MG tablet Commonly known as: NORCO/VICODIN   letrozole 2.5 MG tablet Commonly known as: FEMARA   oxyCODONE-acetaminophen 10-325 MG tablet Commonly known as: PERCOCET       TAKE these medications    acetaminophen 500 MG tablet Commonly known as: TYLENOL Take  2 tablets (1,000 mg total) by mouth every 8 (eight) hours as needed for mild pain.   ALPRAZolam 0.25 MG tablet Commonly known as: XANAX Take 1 tablet (0.25 mg total) by mouth at bedtime as needed for anxiety.   aspirin EC 81 MG tablet Take 1 tablet (81 mg total) by mouth 2 (two) times daily. For DVT prophylaxis for 30 days after surgery.   calcium carbonate 600 MG Tabs tablet Commonly known as: OS-CAL Take 600 mg by mouth daily with breakfast.   meloxicam 15 MG tablet Commonly known as: MOBIC Take 1 tablet (15 mg total) by mouth daily.   methocarbamol 500 MG tablet Commonly known as: Robaxin Take 1 tablet (500 mg total) by mouth every 8 (eight) hours as needed for muscle spasms.   multivitamin with minerals tablet Take 1 tablet by mouth daily.    omeprazole 20 MG tablet Commonly known as: PriLOSEC OTC Take 1 tablet (20 mg total) by mouth daily.   ondansetron 4 MG tablet Commonly known as: Zofran Take 1 tablet (4 mg total) by mouth every 8 (eight) hours as needed for nausea or vomiting.   Oxycodone HCl 10 MG Tabs Take 1 tablet (10 mg total) by mouth every 4 (four) hours as needed (severe pain).   Vitamin D (Cholecalciferol) 25 MCG (1000 UT) Tabs Take 1 tablet by mouth daily.               Durable Medical Equipment  (From admission, onward)           Start     Ordered   04/02/21 1228  For home use only DME Walker rolling  Once       Question Answer Comment  Walker: With 5 Inch Wheels   Patient needs a walker to treat with the following condition S/P right hip fracture      04/02/21 1228            Diagnostic Studies: DG Lumbar Spine Complete  Result Date: 03/11/2021 CLINICAL DATA:  Right hip pain EXAM: LUMBAR SPINE - COMPLETE 4+ VIEW COMPARISON:  CT chest 05/23/2020 FINDINGS: Hypoplastic ribs at T12. Lumbar alignment within normal limits. Chronic superior endplate deformity at P53. Remaining vertebra demonstrate normal stature. The disc spaces are patent. IMPRESSION: Chronic superior endplate deformity at I14. No acute osseous abnormality Electronically Signed   By: Donavan Foil M.D.   On: 03/11/2021 19:24   MR ANGIO HEAD WO CONTRAST  Result Date: 03/26/2021 CLINICAL DATA:  History of breast cancer.  Concern for brain mass. EXAM: MRI HEAD WITHOUT CONTRAST MRA HEAD WITHOUT CONTRAST TECHNIQUE: Multiplanar, multi-echo pulse sequences of the brain and surrounding structures were acquired without intravenous contrast. Angiographic images of the Circle of Willis were acquired using MRA technique without intravenous contrast. COMPARISON:  No pertinent prior exam. FINDINGS: MRI HEAD FINDINGS Brain: No acute infarct, mass effect or extra-axial collection. No acute or chronic hemorrhage. Normal white matter signal,  parenchymal volume and CSF spaces. The midline structures are normal. Vascular: Major flow voids are preserved. Skull and upper cervical spine: Normal calvarium and skull base. Visualized upper cervical spine and soft tissues are normal. Sinuses/Orbits:No paranasal sinus fluid levels or advanced mucosal thickening. No mastoid or middle ear effusion. Normal orbits. MRA HEAD FINDINGS POSTERIOR CIRCULATION: --Vertebral arteries: Normal --Inferior cerebellar arteries: Normal. --Basilar artery: Normal. --Superior cerebellar arteries: Normal. --Posterior cerebral arteries: Normal. ANTERIOR CIRCULATION: --Intracranial internal carotid arteries: Normal. --Anterior cerebral arteries (ACA): Normal. --Middle cerebral arteries (MCA): Normal. ANATOMIC VARIANTS: None IMPRESSION:  Normal MRI/MRA of the brain. No findings of metastatic disease on this noncontrast examination. Electronically Signed   By: Ulyses Jarred M.D.   On: 03/26/2021 21:42   MR BRAIN WO CONTRAST  Result Date: 03/26/2021 CLINICAL DATA:  History of breast cancer.  Concern for brain mass. EXAM: MRI HEAD WITHOUT CONTRAST MRA HEAD WITHOUT CONTRAST TECHNIQUE: Multiplanar, multi-echo pulse sequences of the brain and surrounding structures were acquired without intravenous contrast. Angiographic images of the Circle of Willis were acquired using MRA technique without intravenous contrast. COMPARISON:  No pertinent prior exam. FINDINGS: MRI HEAD FINDINGS Brain: No acute infarct, mass effect or extra-axial collection. No acute or chronic hemorrhage. Normal white matter signal, parenchymal volume and CSF spaces. The midline structures are normal. Vascular: Major flow voids are preserved. Skull and upper cervical spine: Normal calvarium and skull base. Visualized upper cervical spine and soft tissues are normal. Sinuses/Orbits:No paranasal sinus fluid levels or advanced mucosal thickening. No mastoid or middle ear effusion. Normal orbits. MRA HEAD FINDINGS POSTERIOR  CIRCULATION: --Vertebral arteries: Normal --Inferior cerebellar arteries: Normal. --Basilar artery: Normal. --Superior cerebellar arteries: Normal. --Posterior cerebral arteries: Normal. ANTERIOR CIRCULATION: --Intracranial internal carotid arteries: Normal. --Anterior cerebral arteries (ACA): Normal. --Middle cerebral arteries (MCA): Normal. ANATOMIC VARIANTS: None IMPRESSION: Normal MRI/MRA of the brain. No findings of metastatic disease on this noncontrast examination. Electronically Signed   By: Ulyses Jarred M.D.   On: 03/26/2021 21:42   NM Bone Scan Whole Body  Result Date: 03/26/2021 CLINICAL DATA:  Breast cancer. Staging. Lesion on left iliac bone and L3. Lesion on right femoral neck. Right femoral neck bone pain. EXAM: NUCLEAR MEDICINE WHOLE BODY BONE SCAN TECHNIQUE: Whole body anterior and posterior images were obtained approximately 3 hours after intravenous injection of radiopharmaceutical. RADIOPHARMACEUTICALS:  20 mCi Technetium-71m MDP IV COMPARISON:  Chest abdomen pelvis CT 03/18/2021.  Hip MRI 03/14/2021 FINDINGS: Increased radiotracer uptake in the right femoral neck at site of lesion on MRI. Asymmetric increased uptake in the left iliac bone corresponds to lesion on pelvic MRI. There is increased uptake at the cervicothoracic junction there is faint increased uptake within right aspect of L3 at site of lucent lesion on CT. Some of the small lesions on pelvis MRI have no definite uptake. Symmetric uptake of both knees and shoulders is typically degenerative. Both kidneys are visualized. IMPRESSION: Osseous metastatic disease. Most intense radiotracer uptake involves the right proximal femur, with lesions demonstrated on CT and MRI. There also lesions in the left iliac bone, faintly at L3, as well as at the cervicothoracic junction. Electronically Signed   By: Keith Rake M.D.   On: 03/26/2021 17:28   Pelvis Portable  Result Date: 04/01/2021 CLINICAL DATA:  Postoperative right femoral IM  nail placement EXAM: DG HIP (WITH OR WITHOUT PELVIS) 1V PORT RIGHT; PORTABLE PELVIS 1-2 VIEWS COMPARISON:  MRI 03/14/2021 FINDINGS: Interval postsurgical changes to the right femur with long intramedullary rod, distal interlocking screw, and proximal lag screw. No perihardware fracture is seen. There is somewhat permeative appearance of the right femoral neck and intertrochanteric region compatible with known marrow replacing bone lesion better seen on recent MRI. Expected postoperative changes within the overlying soft tissues. Additional known bony lesions within the pelvis are not well seen radiographically. IMPRESSION: Interval postsurgical changes to the right femur without evidence of hardware complication. Electronically Signed   By: Davina Poke D.O.   On: 04/01/2021 15:44   DG C-Arm 1-60 Min-No Report  Result Date: 04/01/2021 Fluoroscopy was utilized by the requesting  physician.  No radiographic interpretation.   DG Hip Port Unilat With Pelvis 1V Right  Result Date: 04/01/2021 CLINICAL DATA:  Postoperative right femoral IM nail placement EXAM: DG HIP (WITH OR WITHOUT PELVIS) 1V PORT RIGHT; PORTABLE PELVIS 1-2 VIEWS COMPARISON:  MRI 03/14/2021 FINDINGS: Interval postsurgical changes to the right femur with long intramedullary rod, distal interlocking screw, and proximal lag screw. No perihardware fracture is seen. There is somewhat permeative appearance of the right femoral neck and intertrochanteric region compatible with known marrow replacing bone lesion better seen on recent MRI. Expected postoperative changes within the overlying soft tissues. Additional known bony lesions within the pelvis are not well seen radiographically. IMPRESSION: Interval postsurgical changes to the right femur without evidence of hardware complication. Electronically Signed   By: Davina Poke D.O.   On: 04/01/2021 15:44   DG HIP OPERATIVE UNILAT W OR W/O PELVIS RIGHT  Result Date: 04/01/2021 CLINICAL DATA:   Intraoperative imaging for prophylactic placement of a right intramedullary nail in a patient with an impending right hip fracture due to metastatic breast cancer. EXAM: OPERATIVE RIGHT HIP (WITH PELVIS IF PERFORMED) 4 VIEWS TECHNIQUE: Fluoroscopic spot image(s) were submitted for interpretation post-operatively. COMPARISON:  Whole-body bone scan 03/26/2021. Plain films right hip 03/11/2021. FINDINGS: Provided images demonstrate placement of a hip screw and long intramedullary nail with a single distal screw. Hardware is intact. No acute abnormality is identified. IMPRESSION: Intraoperative imaging for prophylactic placement a right hip screw and intramedullary nail. Electronically Signed   By: Inge Rise M.D.   On: 04/01/2021 16:45   DG Hip Unilat With Pelvis 2-3 Views Right  Result Date: 03/11/2021 CLINICAL DATA:  Right hip pain EXAM: DG HIP (WITH OR WITHOUT PELVIS) 2-3V RIGHT COMPARISON:  None. FINDINGS: There is no evidence of hip fracture or dislocation. There is no evidence of arthropathy or other focal bone abnormality. IMPRESSION: Negative. Electronically Signed   By: Donavan Foil M.D.   On: 03/11/2021 19:22   CT CHEST ABDOMEN PELVIS WO CONTRAST  Result Date: 03/18/2021 CLINICAL DATA:  Breast cancer restaging. Diagnosis 1 year prior. RIGHT hip pain. EXAM: CT CHEST, ABDOMEN AND PELVIS WITHOUT CONTRAST TECHNIQUE: Multidetector CT imaging of the chest, abdomen and pelvis was performed following the standard protocol without IV contrast. COMPARISON:  CT 05/23/2020 FINDINGS: CT CHEST FINDINGS Cardiovascular: No significant vascular findings. Normal heart size. No pericardial effusion. Mediastinum/Nodes: Lymphadenectomy clips in the LEFT axilla. No axillary adenopathy. Supraclavicular internal mammary adenopathy. No mediastinal hilar adenopathy. Resection of medial LEFT breast mass Lungs/Pleura: 3 mm RIGHT middle lobe nodule (image 84/series 6) is unchanged. No new or suspicious pulmonary nodules.  Tiny 1 mm nodule on image 117 also unchanged Musculoskeletal: No aggressive osseous lesion. CT ABDOMEN AND PELVIS FINDINGS Hepatobiliary: Low-attenuation liver consistent hepatic steatosis. No focal hepatic lesion on noncontrast exam. Gallbladder normal. Pancreas: Normal pancreas. Spleen: Normal spleen Adrenals/urinary tract: Adrenal glands normal. 1 mm nonobstructing calculus in the LEFT kidney. Ureters and bladder normal. Stomach/Bowel: Stomach, small-bowel and cecum are normal. The appendix is not identified but there is no pericecal inflammation to suggest appendicitis. The colon and rectosigmoid colon are normal. Vascular/Lymphatic: Abdominal aorta is normal caliber. There is no retroperitoneal or periportal lymphadenopathy. No pelvic lymphadenopathy. Reproductive: Uterus and adnexa unremarkable. Other: No peritoneal metastasis. Musculoskeletal: Insert cortical destruction of the RIGHT femoral neck (image 115/2.) lucent lesion with cortical destruction in the LEFT iliac bone measuring 16 mm image 92/2. Small lucent lesion in the L3 vertebral body on image 76. IMPRESSION: 1.  Cortical destruction in the RIGHT femoral neck consistent with skeletal metastasis. This lesion may be at risk for pathologic fracture. 2. Lucent lesion in the LEFT iliac bone and L3 vertebral body consistent with multifocal lytic skeletal metastasis. 3. No evidence of soft tissue metastasis, nodal metastasis, or visceral metastasis. 4. Incidental findings of hepatic steatosis and nonobstructing renal calculus. These results will be called to the ordering clinician or representative by the Radiologist Assistant, and communication documented in the PACS or [employer]. Electronically Signed   By: Suzy Bouchard M.D.   On: 03/18/2021 16:52    Disposition: Discharge disposition: 01-Home or Self Care          Follow-up Information     [name], MD. Schedule an appointment as soon as possible for a visit in 1 week(s).    Specialty: Orthopedic Surgery Contact information: [address] [place] [address] [phone]         Care, [facility] Follow up.   Specialty: Home Health Services Why: PT Contact information: [place] [address] [phone]                  Signed: Ventura Bruns PA-C 04/02/2021, 6:52 PM

## 2021-04-03 ENCOUNTER — Telehealth: Payer: Self-pay

## 2021-04-03 ENCOUNTER — Other Ambulatory Visit: Payer: Self-pay | Admitting: Hematology and Oncology

## 2021-04-03 NOTE — Telephone Encounter (Signed)
Pt called and LVM asking about birth control and what she would be able to take. This LPN attempted to call pt to inform her she would be started on Zoladex, which is a form of birth control as it will suppress her ovaries. Pt did not answer. LVM for her to return call to [facility].

## 2021-04-04 DIAGNOSIS — C7951 Secondary malignant neoplasm of bone: Secondary | ICD-10-CM | POA: Diagnosis not present

## 2021-04-04 DIAGNOSIS — M84551D Pathological fracture in neoplastic disease, right femur, subsequent encounter for fracture with routine healing: Secondary | ICD-10-CM | POA: Diagnosis not present

## 2021-04-04 DIAGNOSIS — D630 Anemia in neoplastic disease: Secondary | ICD-10-CM | POA: Diagnosis not present

## 2021-04-04 DIAGNOSIS — K449 Diaphragmatic hernia without obstruction or gangrene: Secondary | ICD-10-CM | POA: Diagnosis not present

## 2021-04-04 DIAGNOSIS — N200 Calculus of kidney: Secondary | ICD-10-CM | POA: Diagnosis not present

## 2021-04-07 ENCOUNTER — Other Ambulatory Visit: Payer: Self-pay | Admitting: *Deleted

## 2021-04-07 ENCOUNTER — Encounter: Payer: Self-pay | Admitting: *Deleted

## 2021-04-07 NOTE — Progress Notes (Signed)
HEMATOLOGY-ONCOLOGY TELEPHONE VISIT PROGRESS NOTE  I connected with Natalie Griffin on 04/08/2021 at  8:45 AM EDT by telephone and verified that I am speaking with the correct person using two identifiers.  I discussed the limitations, risks, security and privacy concerns of performing an evaluation and management service by telephone and the availability of in person appointments.  I also discussed with the patient that there may be a patient responsible charge related to this service. The patient expressed understanding and agreed to proceed.   History of Present Illness: Natalie Griffin is a 45 y.o. female with above-mentioned history of left breast cancer who underwent a left lumpectomy. On 04/01/21 she underwent an right intramedullary nail femoral. She reports via telephone today for follow-up and to discuss the pathology report.   Oncology History  Malignant neoplasm of upper-inner quadrant of left breast in female, estrogen receptor positive ([place])  04/11/2020 Initial Diagnosis   Patient palpated a left breast lump. Mammogram on 03/08/20 showed a 3.0cm mass at the 11 o'clock position with no axillary adenopathy. 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   05/09/2020 Cancer Staging   Staging form: Breast, AJCC 8th Edition - Clinical stage from 05/09/2020: Stage IB (cT2, cN0, cM0, G2, ER+, PR+, HER2+) - Signed by Nicholas Lose, MD on 05/09/2020    06/04/2020 Surgery   Left lumpectomy ([name]): invasive and in situ ductal carcinoma, 3.2cm, clear margins, one left axillary lymph node negative for carcinoma.    02/2021 Relapse/Recurrence   Patient went to orthopedics for right hip pain.  Imaging revealed bone metastasis.  CT CAP: Cortical destruction of the right femoral neck consistent with skeletal metastases at risk for pathologic fracture.  Lucent lesion in the left iliac bone and L3 vertebral body consistent  with multifocal skeletal metastases.     Observations/Objective:     Assessment Plan:  Malignant neoplasm of upper-inner quadrant of left breast in female, estrogen receptor positive ([place]) 04/11/2020:Patient palpated a left breast lump. Mammogram on 03/08/20 showed a 3.0cm mass at the 11 o'clock position with no axillary adenopathy. 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  Patient refused adjuvant chemo, adjuvant radiation and adjuvant antiestrogen therapy.   Patient went to orthopedics for right hip pain.  Imaging revealed bone metastasis.  CT CAP: Cortical destruction of the right femoral neck consistent with skeletal metastases at risk for pathologic fracture.  Lucent lesion in the left iliac bone and L3 vertebral body consistent with multifocal skeletal metastases. --------------------------------------------------------------- 04/01/2021: Femoral intramedullary nail Preliminary pathology report is consistent with metastatic breast cancer.  Breast prognostic panel has been ordered today and hopefully will get the results by tomorrow.  Treatment plan: Tamoxifen 20 mg started 04/08/2021, will add Verzenio if it is ER/PR positive.  We may also add Herceptin if she is HER2 positive. Based on her beliefs she will not receive chemotherapy.  She will also need palliative radiation to that hip joint once the healing is complete. Birth control: We discussed that she could have Mirena IUD for birth control and she will find a gynecologist and get that inserted.  She has a partner and she wants to be able to avoid any pregnancies.    I discussed the assessment  and treatment plan with the patient. The patient was provided an opportunity to ask questions and all were  answered. The patient agreed with the plan and demonstrated an understanding of the instructions. The patient was advised to call back or seek an in-person evaluation if the symptoms worsen or if the condition fails to improve as anticipated.   Total time spent: 15 mins including non-face to face time and time spent for planning, charting and coordination of care  Rulon Eisenmenger, MD 04/08/2021    I, Thana Ates, am acting as scribe for Nicholas Lose, MD.  I have reviewed the above documentation for accuracy and completeness, and I agree with the above.

## 2021-04-07 NOTE — Patient Outreach (Signed)
[facility]) Care Management  04/07/2021  Natalie Griffin [birth year] [id number]  Transition of care call/case closure   Referral received:04/07/21 ( notification 04/04/21 Initial outreach:04/07/21 Insurance: [place] [employer]    Subjective: Initial successful telephone call to patient's preferred number in order to complete transition of care assessment; 2 HIPAA identifiers verified. Explained purpose of call and completed transition of care assessment.  Natalie Griffin states she is doing okay, denies post-operative problems, says surgical incisions/dressing  are unremarkable states surgical pain well managed with prescribed medications. She discussing contacting on call provider over the weekend regarding pain management she reports being prescribed percocet. She reports feeling a little loopy and nauseated after taking medication. She reports taking medication with a food snack,and taking prn nausea medication. Discussed making contact with provider if she continues to experience symptoms with pain medications. She is  tolerating diet, denies bowel or bladder problems. Since discharge patient had her sister staying with her she left today and she has other friends lined to up assist her in recovery. Natalie Griffin discussed making good progress with therapy at home, she anticipates one more home visit prior to completing services and then she may move to outpatient therapy pending her follow up with orthopedic on this week.   Reviewed accessing the following [place] Benefits : She denies any ongoing health issues and says she does not need a referral to one of the [place] chronic disease management programs.  She doe not have the hospital indemnity, she explains recent transition travel position to full time [employer] employee, she states benefits working with her on ADA. She does not use  a [facility] outpatient pharmacy.  Discussed with patient plans on transportation to outpatient  therapy if ordered, she states that it is her right leg and she may have some difficulty with transportation. Discussed [place] transportation benefit with appointment at a [facility] health facility.  Natalie Griffin states she does not have a primary care provider she is interested in receiving information on finding a [place] provider.     Objective:  Natalie Griffin  was hospitalized at [facility] 7/12-7/13/22 for pathological fracture right hip, Right hip intramedullary nail Comorbidities include: Left breast cancer. Left lumpectomy, bone metastases.  She was discharged to home on 04/02/21 with home health physical therapy with [facility], and DME of rolling walker.  Assessment:  Patient voices good understanding of all discharge instructions.  See transition of care flowsheet for assessment details.   Plan:  Reviewed hospital discharge diagnosis of Right Hip IM nail   and discharge treatment plan using hospital discharge instructions, assessing medication adherence, reviewing problems requiring provider notification, and discussing the importance of follow up with surgeon, primary care provider and/or specialists as directed.  Reviewed [place] healthy lifestyle program information to receive discounted premium for  2023   Step 1: Get  your annual physical  Step 2: Complete your health assessment  Step 3:Identify your current health status and complete the corresponding action step between September 21, 2020 and May 22, 2021.     No ongoing care management needs identified so will close case to [place] Management services and route successful outreach letter with [place] Management pamphlet and 24 Hour Nurse Line Magnet to [place] Management clinical pool to be mailed to patient's home address.  Will secure email to patient personal email address with information on [place] find a doctor and [place]  Transportation contact  number.  Thanked patient for their services to [facility].   Joylene Draft, RN, BSN  [place] Management Coordinator  [phone]- Mobile [phone]- Toll Free Main Office

## 2021-04-08 ENCOUNTER — Other Ambulatory Visit: Payer: Self-pay

## 2021-04-08 ENCOUNTER — Inpatient Hospital Stay (HOSPITAL_BASED_OUTPATIENT_CLINIC_OR_DEPARTMENT_OTHER): Payer: 59 | Admitting: Hematology and Oncology

## 2021-04-08 DIAGNOSIS — C50212 Malignant neoplasm of upper-inner quadrant of left female breast: Secondary | ICD-10-CM

## 2021-04-08 DIAGNOSIS — Z170 Estrogen receptor positive status [ER+]: Secondary | ICD-10-CM

## 2021-04-08 MED ORDER — TAMOXIFEN CITRATE 20 MG PO TABS: 20.0000 mg | ORAL_TABLET | Freq: Every day | ORAL | 3 refills | Status: DC

## 2021-04-08 NOTE — Assessment & Plan Note (Signed)
04/11/2020:Patient palpated a left breast lump. Mammogram on 03/08/20 showed a 3.0cm mass at the 11 o'clock position with no axillary adenopathy. 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 Patient refused adjuvant chemo, adjuvant radiation and adjuvant antiestrogen therapy.  Patient went to orthopedics for right hip pain. Imaging revealed bone metastasis. CT CAP: Cortical destruction of the right femoral neck consistent with skeletal metastases at risk for pathologic fracture. Lucent lesion in the left iliac bone and L3 vertebral body consistent with multifocal skeletal metastases. --------------------------------------------------------------- 04/01/2021: Femoral intramedullary nail

## 2021-04-09 ENCOUNTER — Ambulatory Visit (HOSPITAL_BASED_OUTPATIENT_CLINIC_OR_DEPARTMENT_OTHER): Payer: 59 | Admitting: Hematology and Oncology

## 2021-04-09 ENCOUNTER — Encounter (HOSPITAL_COMMUNITY): Payer: Self-pay | Admitting: Orthopedic Surgery

## 2021-04-09 DIAGNOSIS — C50212 Malignant neoplasm of upper-inner quadrant of left female breast: Secondary | ICD-10-CM

## 2021-04-09 DIAGNOSIS — Z170 Estrogen receptor positive status [ER+]: Secondary | ICD-10-CM | POA: Diagnosis not present

## 2021-04-09 LAB — SURGICAL PATHOLOGY

## 2021-04-09 NOTE — Progress Notes (Signed)
HEMATOLOGY-ONCOLOGY TELEPHONE VISIT PROGRESS NOTE  I connected with '@PTNAME' @ on 04/09/21 at 12:00 PM EDT by telephone and verified that I am speaking with the correct person using two identifiers.  I discussed the limitations, risks, security and privacy concerns of performing an evaluation and management service by telephone and the availability of in person appointments.  I also discussed with the patient that there may be a patient responsible charge related to this service. The patient expressed understanding and agreed to proceed.   History of Present Illness: Follow-up to discuss results of pathology from fracture repair of her right hip  Oncology History  Malignant neoplasm of upper-inner quadrant of left breast in female, estrogen receptor positive ([place])  04/11/2020 Initial Diagnosis   Patient palpated a left breast lump. Mammogram on 03/08/20 showed a 3.0cm mass at the 11 o'clock position with no axillary adenopathy. 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   05/09/2020 Cancer Staging   Staging form: Breast, AJCC 8th Edition - Clinical stage from 05/09/2020: Stage IB (cT2, cN0, cM0, G2, ER+, PR+, HER2+) - Signed by Nicholas Lose, MD on 05/09/2020    06/04/2020 Surgery   Left lumpectomy ([name]): invasive and in situ ductal carcinoma, 3.2cm, clear margins, one left axillary lymph node negative for carcinoma.    02/2021 Relapse/Recurrence   Patient went to orthopedics for right hip pain.  Imaging revealed bone metastasis.  CT CAP: Cortical destruction of the right femoral neck consistent with skeletal metastases at risk for pathologic fracture.  Lucent lesion in the left iliac bone and L3 vertebral body consistent with multifocal skeletal metastases.      Assessment Plan:  Malignant neoplasm of upper-inner quadrant of left breast in female, estrogen receptor positive ([place]) 04/11/2020:Patient palpated a left  breast lump. Mammogram on 03/08/20 showed a 3.0cm mass at the 11 o'clock position with no axillary adenopathy. 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  Patient refused adjuvant chemo, adjuvant radiation and adjuvant antiestrogen therapy.   Patient went to orthopedics for right hip pain.  Imaging revealed bone metastasis.  CT CAP: Cortical destruction of the right femoral neck consistent with skeletal metastases at risk for pathologic fracture.  Lucent lesion in the left iliac bone and L3 vertebral body consistent with multifocal skeletal metastases. --------------------------------------------------------------- 04/01/2021: Femoral intramedullary nail  Pathology review: Metastatic breast cancer ER 2% PR 0% HER2 3+ positive Treatment plan: Tamoxifen 20 mg started 04/08/2021, recommendation addition of Herceptin with Perjeta as a subcutaneous injection (Phesgo) versus Herceptin injection alone. She will research both of these options and will get back to [place].  Once she gets back to [place] we will set up for these injections and get authorization.   I discussed the assessment and treatment plan with the patient. The patient was provided an opportunity to ask questions and all were answered. The patient agreed with the plan and demonstrated an understanding of the instructions. The patient was advised to call back or seek an in-person evaluation if the symptoms worsen or if the condition fails to improve as anticipated.   I provided 15 minutes of non-face-to-face time during this encounter. Harriette Ohara, MD

## 2021-04-09 NOTE — Anesthesia Postprocedure Evaluation (Signed)
Anesthesia Post Note  Patient: Natalie Griffin  Procedure(s) Performed: INTRAMEDULLARY (IM) NAIL FEMORAL (Right)     Patient location during evaluation: PACU Anesthesia Type: MAC and Spinal Level of consciousness: awake and alert Pain management: pain level controlled Vital Signs Assessment: post-procedure vital signs reviewed and stable Respiratory status: spontaneous breathing, nonlabored ventilation, respiratory function stable and patient connected to nasal cannula oxygen Cardiovascular status: stable and blood pressure returned to baseline Postop Assessment: no apparent nausea or vomiting Anesthetic complications: no   No notable events documented.  Last Vitals:  Vitals:   04/02/21 1319 04/02/21 1451  BP: (!) 94/52   Pulse: (!) 54   Resp: 18   Temp: 36.9 C   SpO2: 98% 98%    Last Pain:  Vitals:   04/02/21 1319  TempSrc: Oral  PainSc:                  Natalie Griffin

## 2021-04-09 NOTE — Assessment & Plan Note (Signed)
04/11/2020:Patient palpated a left breast lump. Mammogram on 03/08/20 showed a 3.0cm mass at the 11 o'clock position with no axillary adenopathy. 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 Patient refused adjuvant chemo, adjuvant radiation and adjuvant antiestrogen therapy.  Patient went to orthopedics for right hip pain. Imaging revealed bone metastasis. CT CAP: Cortical destruction of the right femoral neck consistent with skeletal metastases at risk for pathologic fracture. Lucent lesion in the left iliac bone and L3 vertebral body consistent with multifocal skeletal metastases. --------------------------------------------------------------- 04/01/2021: Femoral intramedullary nail  Pathology review: Metastatic breast cancer ER 2% PR 0% HER2 3+ positive Treatment plan: Tamoxifen 20 mg started 04/08/2021, recommendation addition of Herceptin with Perjeta as a subcutaneous injection (Phesgo) versus Herceptin injection alone. She will research both of these options and will get back to us.  Once she gets back to us we will set up for these injections and get authorization. 

## 2021-04-11 DIAGNOSIS — D630 Anemia in neoplastic disease: Secondary | ICD-10-CM | POA: Diagnosis not present

## 2021-04-11 DIAGNOSIS — M84551D Pathological fracture in neoplastic disease, right femur, subsequent encounter for fracture with routine healing: Secondary | ICD-10-CM | POA: Diagnosis not present

## 2021-04-11 DIAGNOSIS — M84451D Pathological fracture, right femur, subsequent encounter for fracture with routine healing: Secondary | ICD-10-CM | POA: Diagnosis not present

## 2021-04-11 DIAGNOSIS — K449 Diaphragmatic hernia without obstruction or gangrene: Secondary | ICD-10-CM | POA: Diagnosis not present

## 2021-04-11 DIAGNOSIS — N200 Calculus of kidney: Secondary | ICD-10-CM | POA: Diagnosis not present

## 2021-04-11 DIAGNOSIS — C7951 Secondary malignant neoplasm of bone: Secondary | ICD-10-CM | POA: Diagnosis not present

## 2021-04-14 ENCOUNTER — Telehealth: Payer: Self-pay

## 2021-04-14 NOTE — Telephone Encounter (Signed)
Pt called and states she would like to receive combination injection after researching and pt also asks for refill for oxycodone hcl for bone pain. Dr Lindi Adie is on PAL. I called Darcy at ortho and she is entering request for refill for pt since Dr Lindi Adie is out. Pt is in agreement with plan. Schedule message sent to have pt scheduled via telephone visit to discuss plan.

## 2021-04-18 DIAGNOSIS — M84451D Pathological fracture, right femur, subsequent encounter for fracture with routine healing: Secondary | ICD-10-CM | POA: Diagnosis not present

## 2021-04-22 NOTE — Assessment & Plan Note (Signed)
04/11/2020:Patient palpated a left breast lump. Mammogram on 03/08/20 showed a 3.0cm mass at the 11 o'clock position with no axillary adenopathy. 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 Patient refused adjuvant chemo, adjuvant radiation and adjuvant antiestrogen therapy.  Patient went to orthopedics for right hip pain. Imaging revealed bone metastasis. CT CAP: Cortical destruction of the right femoral neck consistent with skeletal metastases at risk for pathologic fracture. Lucent lesion in the left iliac bone and L3 vertebral body consistent with multifocal skeletal metastases. --------------------------------------------------------------- 04/01/2021: Femoral intramedullary nail  Pathology review: Metastatic breast cancer ER 2% PR 0% HER2 3+ positive Treatment plan: Tamoxifen 20 mg started 04/08/2021, recommendation addition of Herceptin with Perjeta as a subcutaneous injection (Phesgo) versus Herceptin injection alone. She will research both of these options and will get back to [place].  Once she gets back to [place] we will set up for these injections and get authorization.

## 2021-04-22 NOTE — Progress Notes (Signed)
  HEMATOLOGY-ONCOLOGY TELEPHONE VISIT PROGRESS NOTE  I connected with Natalie Griffin on 04/23/2021 at  3:00 PM EDT by telephone and verified that I am speaking with the correct person using two identifiers.  I discussed the limitations, risks, security and privacy concerns of performing an evaluation and management service by telephone and the availability of in person appointments.  I also discussed with the patient that there may be a patient responsible charge related to this service. The patient expressed understanding and agreed to proceed.   History of Present Illness: Natalie Griffin is a 45 y.o. female with above-mentioned history of left breast cancer who underwent a left lumpectomy. She reports via telephone today for follow-up.  Oncology History  Malignant neoplasm of upper-inner quadrant of left breast in female, estrogen receptor positive ([place])  04/11/2020 Initial Diagnosis   Patient palpated a left breast lump. Mammogram on 03/08/20 showed a 3.0cm mass at the 11 o'clock position with no axillary adenopathy. 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   05/09/2020 Cancer Staging   Staging form: Breast, AJCC 8th Edition - Clinical stage from 05/09/2020: Stage IB (cT2, cN0, cM0, G2, ER+, PR+, HER2+) - Signed by Nicholas Lose, MD on 05/09/2020    06/04/2020 Surgery   Left lumpectomy ([name]): invasive and in situ ductal carcinoma, 3.2cm, clear margins, one left axillary lymph node negative for carcinoma.    02/2021 Relapse/Recurrence   Patient went to orthopedics for right hip pain.  Imaging revealed bone metastasis.  CT CAP: Cortical destruction of the right femoral neck consistent with skeletal metastases at risk for pathologic fracture.  Lucent lesion in the left iliac bone and L3 vertebral body consistent with multifocal skeletal metastases.     Observations/Objective:     Assessment Plan:  Malignant  neoplasm of upper-inner quadrant of left breast in female, estrogen receptor positive ([place]) 04/11/2020:Patient palpated a left breast lump. Mammogram on 03/08/20 showed a 3.0cm mass at the 11 o'clock position with no axillary adenopathy. 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  Patient refused adjuvant chemo, adjuvant radiation and adjuvant antiestrogen therapy.   Patient went to orthopedics for right hip pain.  Imaging revealed bone metastasis.  CT CAP: Cortical destruction of the right femoral neck consistent with skeletal metastases at risk for pathologic fracture.  Lucent lesion in the left iliac bone and L3 vertebral body consistent with multifocal skeletal metastases. --------------------------------------------------------------- 04/01/2021: Femoral intramedullary nail  Pathology review: Metastatic breast cancer ER 2% PR 0% HER2 3+ positive Treatment plan: Tamoxifen 20 mg started 04/08/2021, plan is for subcutaneous Herceptin Perjeta injection (Phesgo) Palliative radiation to the iliac bone and hip  She will need Xgeva as well to prevent bone fractures. Return to clinic on 05/14/2021 to start treatment  Total time spent: 21 mins including non-face to face time and time spent for planning, charting and coordination of care  Rulon Eisenmenger, MD 04/23/2021    I, Thana Ates, am acting as scribe for Nicholas Lose, MD.  I have reviewed the above documentation for accuracy and completeness, and I agree with the above.

## 2021-04-23 ENCOUNTER — Other Ambulatory Visit: Payer: Self-pay | Admitting: *Deleted

## 2021-04-23 ENCOUNTER — Inpatient Hospital Stay: Payer: 59 | Attending: Hematology and Oncology | Admitting: Hematology and Oncology

## 2021-04-23 DIAGNOSIS — C50212 Malignant neoplasm of upper-inner quadrant of left female breast: Secondary | ICD-10-CM | POA: Insufficient documentation

## 2021-04-23 DIAGNOSIS — Z170 Estrogen receptor positive status [ER+]: Secondary | ICD-10-CM | POA: Diagnosis not present

## 2021-04-23 DIAGNOSIS — Z5112 Encounter for antineoplastic immunotherapy: Secondary | ICD-10-CM | POA: Insufficient documentation

## 2021-04-23 DIAGNOSIS — C7951 Secondary malignant neoplasm of bone: Secondary | ICD-10-CM | POA: Insufficient documentation

## 2021-04-23 NOTE — Progress Notes (Signed)
START ON PATHWAY REGIMEN - Breast     A cycle is every 21 days:     Pertuzumab      Pertuzumab      Trastuzumab-xxxx      Trastuzumab-xxxx   **Always confirm dose/schedule in your pharmacy ordering system**  Patient Characteristics: Distant Metastases or Locoregional Recurrent Disease - Unresected or Locally Advanced Unresectable Disease Progressing after Neoadjuvant and Local Therapies, HER2 Positive, ER Positive, HER2-Targeted Therapy (Concurrent with Endocrine Therapy) Therapeutic Status: Distant Metastases ER Status: Positive (+) HER2 Status: Positive (+) PR Status: Negative (-) Intent of Therapy: Non-Curative / Palliative Intent, Discussed with Patient

## 2021-04-24 ENCOUNTER — Telehealth: Payer: Self-pay | Admitting: Hematology and Oncology

## 2021-04-24 NOTE — Telephone Encounter (Signed)
Scheduled appointment per 08/03 los. Patient is aware.

## 2021-04-25 ENCOUNTER — Other Ambulatory Visit: Payer: Self-pay | Admitting: Pharmacist

## 2021-04-28 ENCOUNTER — Other Ambulatory Visit: Payer: Self-pay | Admitting: Hematology and Oncology

## 2021-04-29 ENCOUNTER — Ambulatory Visit
Admission: RE | Admit: 2021-04-29 | Discharge: 2021-04-29 | Disposition: A | Discharge status: Home / Self Care | Payer: 59 | Source: Ambulatory Visit | Attending: Radiation Oncology | Admitting: Radiation Oncology

## 2021-04-29 ENCOUNTER — Ambulatory Visit: Payer: 59

## 2021-04-29 ENCOUNTER — Other Ambulatory Visit: Payer: Self-pay | Admitting: *Deleted

## 2021-04-29 DIAGNOSIS — Z170 Estrogen receptor positive status [ER+]: Secondary | ICD-10-CM

## 2021-04-29 DIAGNOSIS — C50212 Malignant neoplasm of upper-inner quadrant of left female breast: Secondary | ICD-10-CM

## 2021-04-29 NOTE — Progress Notes (Cosign Needed)
Pt no showed for appt and will be rescheduled

## 2021-05-02 ENCOUNTER — Other Ambulatory Visit: Payer: Self-pay

## 2021-05-02 ENCOUNTER — Ambulatory Visit
Admission: RE | Admit: 2021-05-02 | Discharge: 2021-05-02 | Disposition: A | Discharge status: Home / Self Care | Payer: 59 | Source: Ambulatory Visit | Attending: Hematology and Oncology | Admitting: Hematology and Oncology

## 2021-05-02 DIAGNOSIS — Z853 Personal history of malignant neoplasm of breast: Secondary | ICD-10-CM | POA: Diagnosis not present

## 2021-05-02 DIAGNOSIS — C50212 Malignant neoplasm of upper-inner quadrant of left female breast: Secondary | ICD-10-CM

## 2021-05-02 DIAGNOSIS — R922 Inconclusive mammogram: Secondary | ICD-10-CM | POA: Diagnosis not present

## 2021-05-02 DIAGNOSIS — Z170 Estrogen receptor positive status [ER+]: Secondary | ICD-10-CM

## 2021-05-02 IMAGING — MG DIGITAL DIAGNOSTIC BILAT W/ TOMO W/ CAD
6 of 9 series · 6 of 25 positions shown · non-contrast
Comparison: Previous exam(s).

CLINICAL DATA: Status post left lumpectomy for breast carcinoma
performed in [DATE]. Patient has metastatic breast carcinoma
to the skeleton.

EXAM:
DIGITAL DIAGNOSTIC BILATERAL MAMMOGRAM WITH TOMOSYNTHESIS AND CAD
TECHNIQUE: Bilateral digital diagnostic mammography and breast tomosynthesis
was performed. The images were evaluated with computer-aided
detection.

[L MLO]
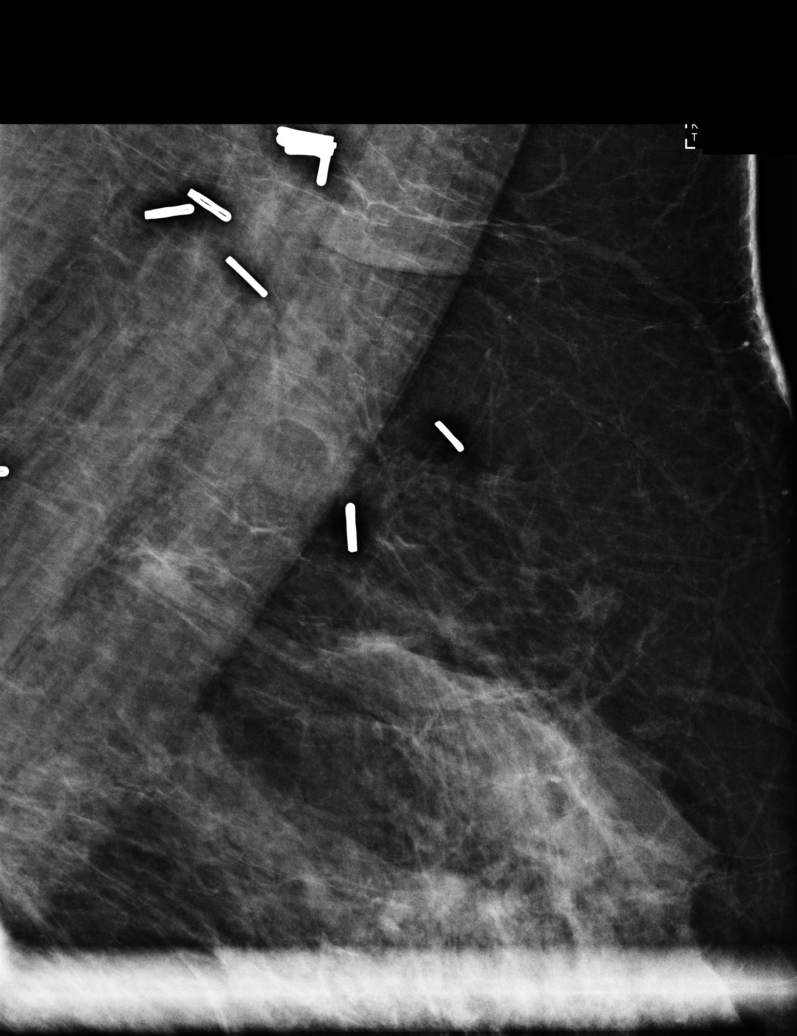

[L MLO synth-2D]
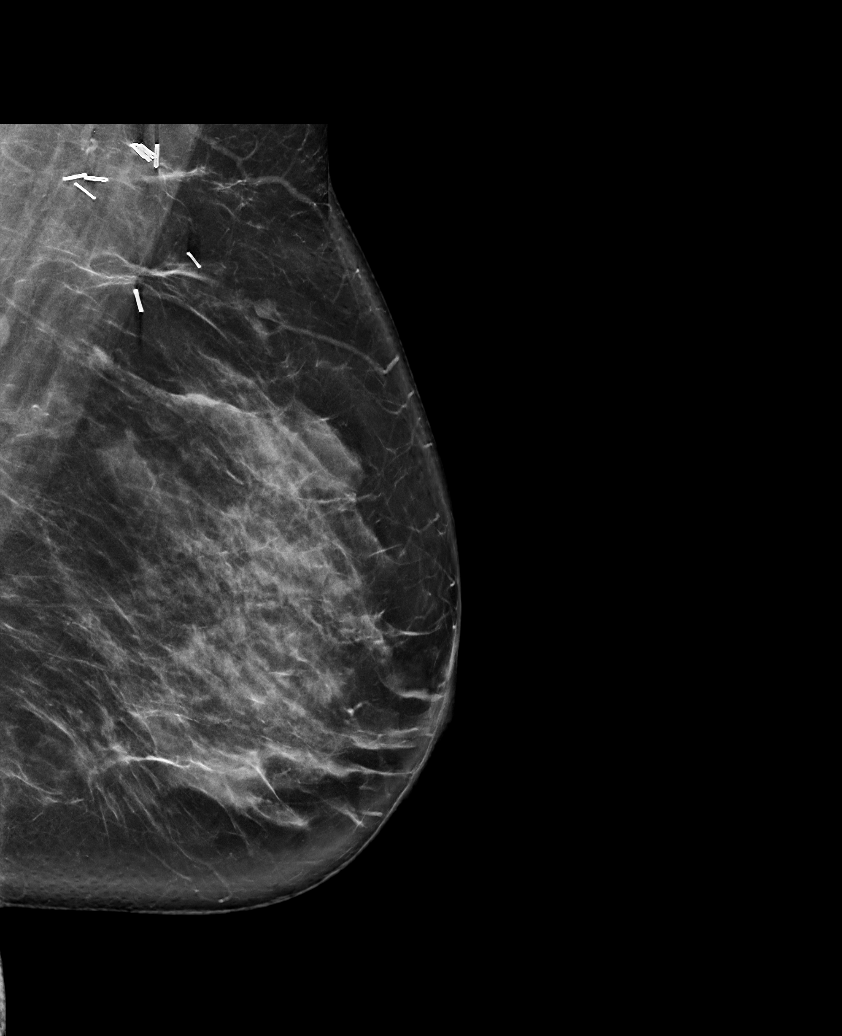

[R CC synth-2D]
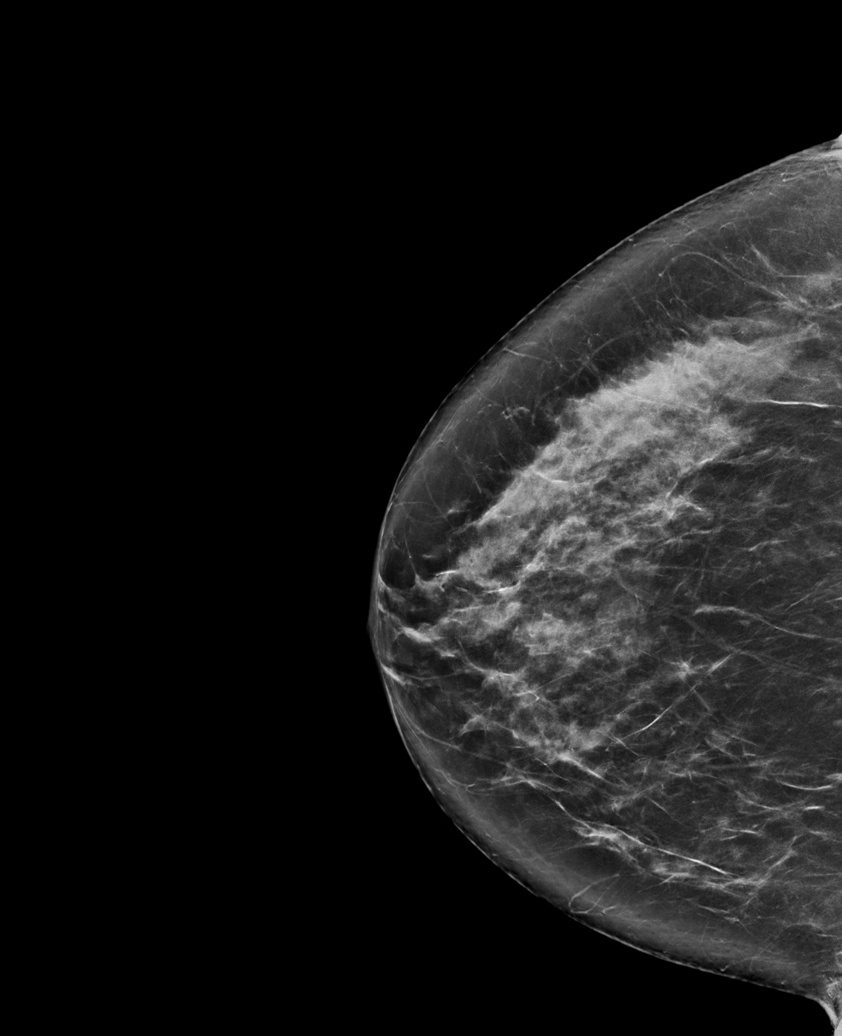

[R MLO synth-2D]
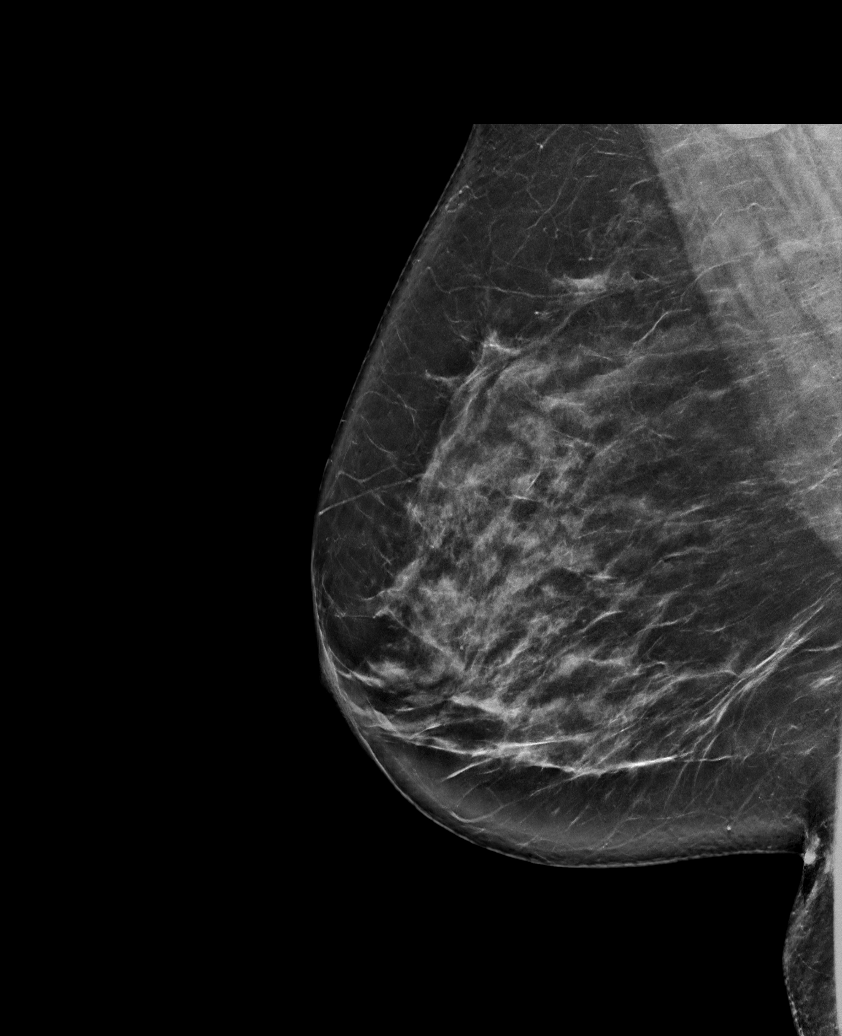

[L CC synth-2D]
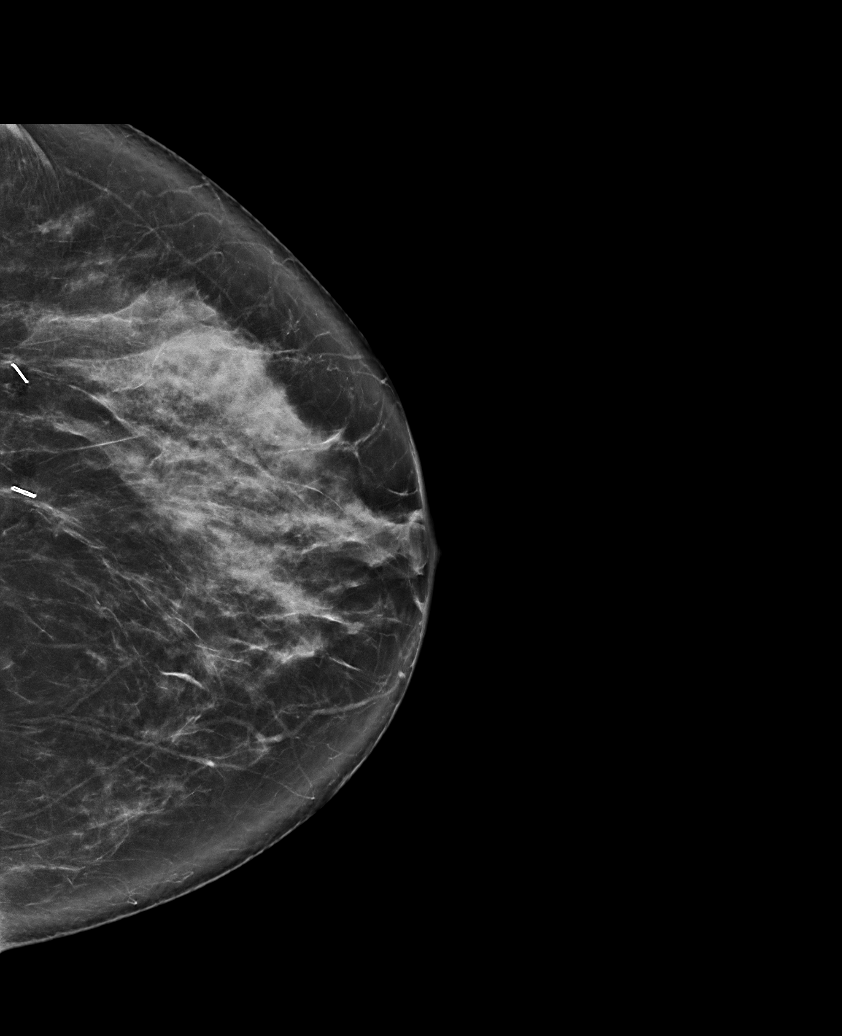

[R MLO tomo · tomo slice 45/89.0]
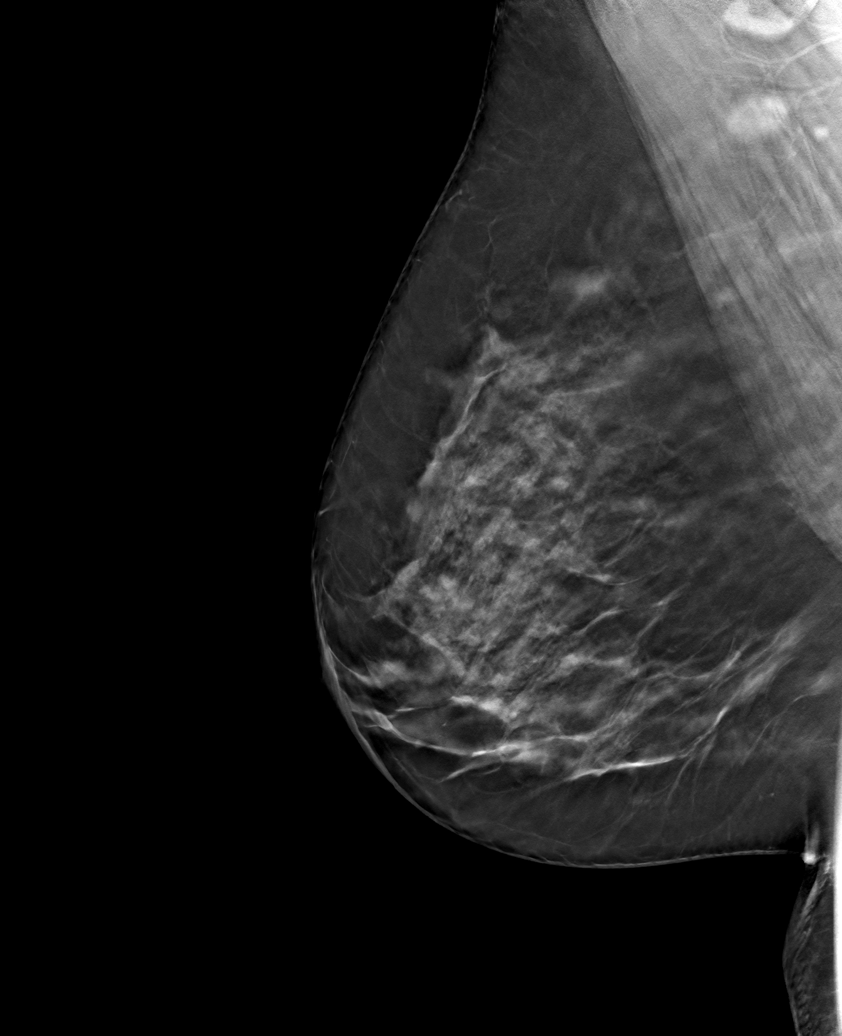

[6 of 25 positions shown; findings below may reference images not displayed]

ACR Breast Density Category c: The breast tissue is heterogeneously
dense, which may obscure small masses.
FINDINGS: Post lumpectomy changes noted the posterior, upper outer left breast
adjacent left axilla.

There are no suspicious masses, areas of nonsurgical architectural
distortion or suspicious calcifications.
IMPRESSION: 1. No evidence of new or recurrent breast carcinoma.
2. Benign post lumpectomy changes on the left.

RECOMMENDATION:
Diagnostic mammography in 1 year per standard post lumpectomy
protocol.

I have discussed the findings and recommendations with the patient.
If applicable, a reminder letter will be sent to the patient
regarding the next appointment.

BI-RADS CATEGORY  2: Benign.

## 2021-05-05 DIAGNOSIS — M25551 Pain in right hip: Secondary | ICD-10-CM | POA: Diagnosis not present

## 2021-05-05 DIAGNOSIS — M6281 Muscle weakness (generalized): Secondary | ICD-10-CM | POA: Diagnosis not present

## 2021-05-05 DIAGNOSIS — M84451D Pathological fracture, right femur, subsequent encounter for fracture with routine healing: Secondary | ICD-10-CM | POA: Diagnosis not present

## 2021-05-05 NOTE — Progress Notes (Signed)
Histology and Location of Primary Cancer: Left Breast Cancer metastatic to bone  Sites of Visceral and Bony Metastatic Disease: Right Femur  Patient was seen by orthopedics with complaints of right hip pain.  Imaging obtained and revealed bone metastasis.  Dr. Percell Miller 04/01/2021 Femoral Intramedullary Nail  MRI Brain 03/26/2021:  Normal MRI/MRA of the brain. No findings of metastatic disease on this noncontrast examination.  Bone Scan 03/26/2021: Osseous metastatic disease. Most intense radiotracer uptake involves the right proximal femur, with lesions demonstrated on CT and MRI.  There also lesions in the left iliac bone, faintly at L3, as well as at the cervicothoracic junction  CT CAP 03/18/2021: Cortical destruction in the RIGHT femoral neck consistent with skeletal metastasis. This lesion may be at risk for pathologic fracture.  Lucent lesion in the LEFT iliac bone and L3 vertebral body consistent with multifocal lytic skeletal metastasis.   No evidence of soft tissue metastasis, nodal metastasis, or visceral metastasis.   Biopsy of Right Femur Mass 04/01/2021    Past/Anticipated chemotherapy by medical oncology, if any:  Dr. Lindi Adie 04/23/2021 -Treatment plan: Tamoxifen 20 mg started 04/08/2021, plan is for subcutaneous Herceptin Perjeta injection (Phesgo) -Palliative radiation to the iliac bone and hip -She will need Xgeva as well to prevent bone fractures. -Return to clinic on 05/14/2021 to start treatment   Pain on a scale of 0-10 is:  2/10 in right hip   Ambulatory status? Walker? Wheelchair?: Using a Cane  SAFETY ISSUES: Prior radiation? No Pacemaker/ICD? No Possible current pregnancy? Having Cycles Is the patient on methotrexate? No  Current Complaints / other details:   -History of left breast cancer- left lumpectomy and SLN biopsy 05/2020, declined chemotherapy, radiation and adjuvant antiestrogen therapy.

## 2021-05-06 ENCOUNTER — Ambulatory Visit
Admission: RE | Admit: 2021-05-06 | Discharge: 2021-05-06 | Disposition: A | Discharge status: Home / Self Care | Payer: 59 | Source: Ambulatory Visit | Attending: Radiation Oncology | Admitting: Radiation Oncology

## 2021-05-06 ENCOUNTER — Encounter: Payer: Self-pay | Admitting: Radiation Oncology

## 2021-05-06 ENCOUNTER — Other Ambulatory Visit: Payer: Self-pay

## 2021-05-06 VITALS — BP 129/89 | HR 106 | Temp 97.8°F | Resp 18 | Wt 186.5 lb

## 2021-05-06 DIAGNOSIS — Z87442 Personal history of urinary calculi: Secondary | ICD-10-CM | POA: Diagnosis not present

## 2021-05-06 DIAGNOSIS — Z7982 Long term (current) use of aspirin: Secondary | ICD-10-CM | POA: Insufficient documentation

## 2021-05-06 DIAGNOSIS — C50212 Malignant neoplasm of upper-inner quadrant of left female breast: Secondary | ICD-10-CM | POA: Diagnosis not present

## 2021-05-06 DIAGNOSIS — C50912 Malignant neoplasm of unspecified site of left female breast: Secondary | ICD-10-CM | POA: Diagnosis not present

## 2021-05-06 DIAGNOSIS — Z170 Estrogen receptor positive status [ER+]: Secondary | ICD-10-CM | POA: Diagnosis not present

## 2021-05-06 DIAGNOSIS — Z79899 Other long term (current) drug therapy: Secondary | ICD-10-CM | POA: Insufficient documentation

## 2021-05-06 DIAGNOSIS — C7951 Secondary malignant neoplasm of bone: Secondary | ICD-10-CM | POA: Diagnosis not present

## 2021-05-06 DIAGNOSIS — K449 Diaphragmatic hernia without obstruction or gangrene: Secondary | ICD-10-CM | POA: Insufficient documentation

## 2021-05-06 DIAGNOSIS — Z87891 Personal history of nicotine dependence: Secondary | ICD-10-CM | POA: Insufficient documentation

## 2021-05-06 NOTE — Progress Notes (Addendum)
Radiation Oncology         [phone] ________________________________  Name: Natalie Griffin        MRN: [id number]  Date of Service: 05/06/2021 DOB: 1975/11/10  CC:Pcp, No  Natalie Lose, MD     REFERRING PHYSICIAN: Nicholas Lose, MD   DIAGNOSIS: The encounter diagnosis was Secondary malignant neoplasm of bone ([place]).   HISTORY OF PRESENT ILLNESS: Natalie Griffin is a 45 y.o. female seen at the request of Dr. 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  She underwent left lumpectomy and sentinel lymph node biopsy in September 2021, she declined adjuvant chemotherapy radiation and adjuvant antiestrogen therapy.  She was being evaluated more recently with pain in her right hip, CT imaging showed a destructive lesion in the right femur along the neck of concern for risk of pathologic fracture lucency in the left iliac bone and L3 vertebral body were also seen.  She also underwent a bone scan on 03/26/2021 with activity in the right femoral neck, left iliac bone, cervicothoracic junction with faint increased uptake along the lateral aspect of the L3 and small lesions on MRI pelvis that had also been performed did not have definitive uptake, degenerative changes were seen in the knees and shoulders bilaterally.  An MRI angio was performed and was negative of the brain on 03/26/2021 as well as MRI brain without contrast.  She was seen by Dr. Percell Miller in orthopedics and underwent right intramedullary nail placement on 04/01/2021 and is seen to discuss palliative radiotherapy.     PREVIOUS RADIATION THERAPY: No   PAST MEDICAL HISTORY:  Past Medical History:  Diagnosis Date   Anemia    Bone metastasis ([place])    Breast cancer ([place]) 2021   Left breast invasive ductal carcinoma   Cancer ([place]) 2021   Left breast   Family history of adverse reaction to anesthesia    Father and Aunt have pseudocholinesterase deficiency - Trouble  waking up from anesthesia   History of hiatal hernia    History of kidney stones    noted on CT Left nonobstructive       PAST SURGICAL HISTORY: Past Surgical History:  Procedure Laterality Date   BREAST LUMPECTOMY WITH RADIOACTIVE SEED AND SENTINEL LYMPH NODE BIOPSY Left 06/04/2020   Procedure: LEFT BREAST LUMPECTOMY WITH RADIOACTIVE SEED AND SENTINEL LYMPH NODE MAPPING;  Surgeon: Erroll Luna, MD;  Location: [facility];  Service: General;  Laterality: Left;  PEC BLOCK, RNFA   FEMUR IM NAIL Right 04/01/2021   Procedure: INTRAMEDULLARY (IM) NAIL FEMORAL;  Surgeon: [name], MD;  Location: [facility];  Service: Orthopedics;  Laterality: Right;   WISDOM TOOTH EXTRACTION       FAMILY HISTORY: History reviewed. No pertinent family history.   SOCIAL HISTORY:  reports that she quit smoking about a year ago. Her smoking use included cigarettes. She has never used smokeless tobacco. She reports current alcohol use. She reports that she does not use drugs.  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.   ALLERGIES: Adhesive [tape], Tetanus toxoid, Succinylcholine, and Contrast media [iodinated diagnostic agents]   MEDICATIONS:  Current Outpatient Medications  Medication Sig Dispense Refill   aspirin EC 81 MG tablet Take 1 tablet (81 mg total) by mouth 2 (two) times daily. For DVT prophylaxis for 30 days after  surgery. 60 tablet 0   calcium carbonate (OS-CAL) 600 MG TABS tablet Take 600 mg by mouth daily with breakfast.     Multiple Vitamins-Minerals (MULTIVITAMIN WITH MINERALS) tablet Take 1 tablet by mouth daily.     tamoxifen (NOLVADEX) 20 MG tablet Take 1 tablet (20 mg total) by mouth daily. 90 tablet 3   Vitamin D, Cholecalciferol, 25 MCG (1000 UT) TABS Take 1 tablet by mouth daily. 60 tablet    No current facility-administered medications for this encounter.     REVIEW OF SYSTEMS: On review  of systems, the patient reports that she has been doing well since her surgery and has been getting around with the assistance of a cane.  She denies taking any pain medication including over-the-counter medication.  She describes a throbbing sensation in the right thigh and hip at nighttime which is worsened during the day.  She denies any pain in her left hip or buttock region or in her back.  No ice.    PHYSICAL EXAM:  Wt Readings from Last 3 Encounters:  05/06/21 186 lb 8 oz (84.6 kg)  04/01/21 187 lb (84.8 kg)  03/19/21 188 lb 6.4 oz (85.5 kg)   Temp Readings from Last 3 Encounters:  05/06/21 97.8 F (36.6 C) (Oral)  04/02/21 98.5 F (36.9 C) (Oral)  03/26/21 99.2 F (37.3 C) (Oral)   BP Readings from Last 3 Encounters:  05/06/21 129/89  04/02/21 (!) 94/52  03/26/21 112/68   Pulse Readings from Last 3 Encounters:  05/06/21 (!) 106  04/02/21 (!) 54  03/26/21 84   Pain Assessment Pain Score: 2  Pain Loc: Hip/10  In general this is a well appearing Caucasian female in no acute distress.  She's alert and oriented x4 and appropriate throughout the examination. Cardiopulmonary assessment is negative for acute distress and she exhibits normal effort.     ECOG = 1  0 - Asymptomatic (Fully active, able to carry on all predisease activities without restriction)  1 - Symptomatic but completely ambulatory (Restricted in physically strenuous activity but ambulatory and able to carry out work of a light or sedentary nature. For example, light housework, office work)  2 - Symptomatic, <50% in bed during the day (Ambulatory and capable of all self care but unable to carry out any work activities. Up and about more than 50% of waking hours)  3 - Symptomatic, >50% in bed, but not bedbound (Capable of only limited self-care, confined to bed or chair 50% or more of waking hours)  4 - Bedbound (Completely disabled. Cannot carry on any self-care. Totally confined to bed or chair)  5 -  Death   Eustace Pen MM, Creech RH, Tormey DC, et al. [phone]). "Toxicity and response criteria of the [facility]". [place] Oncol. 5 (6): 649-55    LABORATORY DATA:  Lab Results  Component Value Date   WBC 15.1 (H) 04/02/2021   HGB 10.5 (L) 04/02/2021   HCT 33.3 (L) 04/02/2021   MCV 91.2 04/02/2021   PLT 287 04/02/2021   Lab Results  Component Value Date   NA 137 04/02/2021   K 4.5 04/02/2021   CL 106 04/02/2021   CO2 20 (L) 04/02/2021   Lab Results  Component Value Date   ALT 16 03/26/2021   AST 18 03/26/2021   ALKPHOS 67 03/26/2021   BILITOT 0.5 03/26/2021      RADIOGRAPHY: MM DIAG BREAST TOMO BILATERAL  Result Date: 05/02/2021 CLINICAL DATA:  Status post left  lumpectomy for breast carcinoma performed in September 2021. Patient has metastatic breast carcinoma to the skeleton. EXAM: DIGITAL DIAGNOSTIC BILATERAL MAMMOGRAM WITH TOMOSYNTHESIS AND CAD TECHNIQUE: Bilateral digital diagnostic mammography and breast tomosynthesis was performed. The images were evaluated with computer-aided detection. COMPARISON:  Previous exam(s). ACR Breast Density Category c: The breast tissue is heterogeneously dense, which may obscure small masses. FINDINGS: Post lumpectomy changes noted the posterior, upper outer left breast adjacent left axilla. There are no suspicious masses, areas of nonsurgical architectural distortion or suspicious calcifications. IMPRESSION: 1. No evidence of new or recurrent breast carcinoma. 2. Benign post lumpectomy changes on the left. RECOMMENDATION: Diagnostic mammography in 1 year per standard post lumpectomy protocol. I have discussed the findings and recommendations with the patient. If applicable, a reminder letter will be sent to the patient regarding the next appointment. BI-RADS CATEGORY  2: Benign. Electronically Signed   By: Lajean Manes M.D.   On: 05/02/2021 16:11      IMPRESSION/PLAN: 1. Metastatic triple positive invasive ductal  carcinoma the left breast with a right femur, L3 spine and left iliac bone. Dr. Lisbeth Renshaw discusses the pathology findings and reviews the nature of metastatic bony disease.  He would offer palliative radiotherapy to the right hip, and could consider radiotherapy to the left iliac bone and L3 spine but at this time the patient is asymptomatic and would like to avoid treatment unless she became symptomatic.  He discusses that he agrees that this is appropriate as it does not appear to be a case where she would be at risk for further fracture or cord compression.  If her pain however progressed we would be happy to consider treatment to those other sites.  She is aware of the role of systemic therapy as well.  She is planning to begin we discussed the risks, benefits, short, and long term effects of radiotherapy, as well as the curative intent, and the patient is interested in proceeding. Dr. Lisbeth Renshaw discusses the delivery and logistics of radiotherapy and anticipates a course of 2 weeks of radiotherapy. Written consent is obtained and placed in the chart, a copy was provided to the patient.  She will simulate on Friday this week with desires to begin treatment the week of 05/19/2021. 2. Contraceptive Counseling. The patient is still having menstrual cycles and her contraception of choice is condoms at this time. She is aware of the need to do a home pregnancy test the day she comes for simulation and to continue with condoms during sexual activity throughout radiotherapy as she understands the risks to a fetus during radiotherapy.  In a visit lasting 60 minutes, greater than 50% of the time was spent face to face discussing the patient's condition, in preparation for the discussion, and coordinating the patient's care.   The above documentation reflects my direct findings during this shared patient visit. Please see the separate note by Dr. Lisbeth Renshaw on this date for the remainder of the patient's plan of  care.    Carola Rhine, [facility]   **Disclaimer: This note was dictated with voice recognition software. Similar sounding words can inadvertently be transcribed and this note may contain transcription errors which may not have been corrected upon publication of note.**

## 2021-05-07 NOTE — Addendum Note (Signed)
Encounter addended by: Hayden Pedro, PA-C on: 05/07/2021 1:19 PM  Actions taken: Clinical Note Signed

## 2021-05-07 NOTE — Progress Notes (Signed)
Pharmacist Chemotherapy Monitoring - Initial Assessment    Anticipated start date: 05/14/21   The following has been reviewed per standard work regarding the patient's treatment regimen: The patient's diagnosis, treatment plan and drug doses, and organ/hematologic function Lab orders and baseline tests specific to treatment regimen  The treatment plan start date, drug sequencing, and pre-medications Prior authorization status  Patient's documented medication list, including drug-drug interaction screen and prescriptions for anti-emetics and supportive care specific to the treatment regimen The drug concentrations, fluid compatibility, administration routes, and timing of the medications to be used The patient's access for treatment and lifetime cumulative dose history, if applicable  The patient's medication allergies and previous infusion related reactions, if applicable   Changes made to treatment plan:  N/A  Follow up needed:  Pending authorization for treatment    Larene Beach, [place], 05/07/2021  12:09 PM

## 2021-05-08 ENCOUNTER — Ambulatory Visit (HOSPITAL_COMMUNITY)
Admission: RE | Admit: 2021-05-08 | Discharge: 2021-05-08 | Disposition: A | Discharge status: Home / Self Care | Payer: 59 | Source: Ambulatory Visit | Attending: Hematology and Oncology | Admitting: Hematology and Oncology

## 2021-05-08 ENCOUNTER — Other Ambulatory Visit: Payer: Self-pay

## 2021-05-08 ENCOUNTER — Ambulatory Visit (HOSPITAL_COMMUNITY): Payer: 59

## 2021-05-08 DIAGNOSIS — C50212 Malignant neoplasm of upper-inner quadrant of left female breast: Secondary | ICD-10-CM | POA: Diagnosis not present

## 2021-05-08 DIAGNOSIS — Z0189 Encounter for other specified special examinations: Secondary | ICD-10-CM

## 2021-05-08 DIAGNOSIS — Z170 Estrogen receptor positive status [ER+]: Secondary | ICD-10-CM | POA: Diagnosis not present

## 2021-05-08 LAB — ECHOCARDIOGRAM COMPLETE
Area-P 1/2: 5.02 cm2
S' Lateral: 2.7 cm

## 2021-05-08 NOTE — Progress Notes (Signed)
  Echocardiogram 2D Echocardiogram has been performed.  Natalie Griffin 05/08/2021, 2:00 PM

## 2021-05-09 ENCOUNTER — Ambulatory Visit
Admission: RE | Admit: 2021-05-09 | Discharge: 2021-05-09 | Disposition: A | Discharge status: Home / Self Care | Payer: 59 | Source: Ambulatory Visit | Attending: Radiation Oncology | Admitting: Radiation Oncology

## 2021-05-09 DIAGNOSIS — C50212 Malignant neoplasm of upper-inner quadrant of left female breast: Secondary | ICD-10-CM | POA: Diagnosis not present

## 2021-05-09 DIAGNOSIS — C50912 Malignant neoplasm of unspecified site of left female breast: Secondary | ICD-10-CM | POA: Diagnosis not present

## 2021-05-09 DIAGNOSIS — Z170 Estrogen receptor positive status [ER+]: Secondary | ICD-10-CM | POA: Diagnosis not present

## 2021-05-09 DIAGNOSIS — Z510 Encounter for antineoplastic radiation therapy: Secondary | ICD-10-CM | POA: Diagnosis not present

## 2021-05-09 DIAGNOSIS — C7951 Secondary malignant neoplasm of bone: Secondary | ICD-10-CM | POA: Diagnosis not present

## 2021-05-12 DIAGNOSIS — M6281 Muscle weakness (generalized): Secondary | ICD-10-CM | POA: Diagnosis not present

## 2021-05-12 DIAGNOSIS — M25551 Pain in right hip: Secondary | ICD-10-CM | POA: Diagnosis not present

## 2021-05-12 DIAGNOSIS — M84451D Pathological fracture, right femur, subsequent encounter for fracture with routine healing: Secondary | ICD-10-CM | POA: Diagnosis not present

## 2021-05-13 NOTE — Assessment & Plan Note (Signed)
04/11/2020:Patient palpated a left breast lump. Mammogram on 03/08/20 showed a 3.0cm mass at the 11 o'clock position with no axillary adenopathy. 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 Patient refused adjuvant chemo, adjuvant radiation and adjuvant antiestrogen therapy.  Patient went to orthopedics for right hip pain. Imaging revealed bone metastasis. CT CAP: Cortical destruction of the right femoral neck consistent with skeletal metastases at risk for pathologic fracture. Lucent lesion in the left iliac bone and L3 vertebral body consistent with multifocal skeletal metastases. --------------------------------------------------------------- 04/01/2021: Femoral intramedullary nail Pathology review: Metastatic breast cancer ER 2% PR 0% HER2 3+ positive Treatment plan: Tamoxifen 20 mg started 04/08/2021, plan is for subcutaneous Herceptin Perjeta injection (P[facility]go) Palliative radiation to the iliac bone and hip  She will need Xgeva as well to prevent bone fractures. Return to clinic on 05/14/2021 to start treatment

## 2021-05-13 NOTE — Progress Notes (Signed)
She will first treatment authorized L1 to  Patient Care Team: Pcp, No as PCP - General Rockwell Germany, RN as Oncology Nurse Navigator Mauro Kaufmann, RN as Oncology Nurse Navigator  DIAGNOSIS:    ICD-10-CM   1. Malignant neoplasm of upper-inner quadrant of left breast in female, estrogen receptor positive ([place])  C50.212    Z17.0       SUMMARY OF ONCOLOGIC HISTORY: Oncology History  Malignant neoplasm of upper-inner quadrant of left breast in female, estrogen receptor positive ([place])  04/11/2020 Initial Diagnosis   Patient palpated a left breast lump. Mammogram on 03/08/20 showed a 3.0cm mass at the 11 o'clock position with no axillary adenopathy. 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   05/09/2020 Cancer Staging   Staging form: Breast, AJCC 8th Edition - Clinical stage from 05/09/2020: Stage IB (cT2, cN0, cM0, G2, ER+, PR+, HER2+) - Signed by Nicholas Lose, MD on 05/09/2020   06/04/2020 Surgery   Left lumpectomy ([name]): invasive and in situ ductal carcinoma, 3.2cm, clear margins, one left axillary lymph node negative for carcinoma.    02/2021 Relapse/Recurrence   Patient went to orthopedics for right hip pain.  Imaging revealed bone metastasis.  CT CAP: Cortical destruction of the right femoral neck consistent with skeletal metastases at risk for pathologic fracture.  Lucent lesion in the left iliac bone and L3 vertebral body consistent with multifocal skeletal metastases.   05/14/2021 -  Chemotherapy    Patient is on Treatment Plan: BREAST TRASTUZUMAB + PERTUZUMAB Q21D         CHIEF COMPLIANT: Follow-up of left breast cancer  INTERVAL HISTORY: Natalie Griffin is a 45 y.o. with above-mentioned history of left breast cancer who underwent a left lumpectomy. Mammogram on 05/02/21 showed no evidence of malignancy. She presents to the clinic today for follow-up.  ALLERGIES:  is allergic to adhesive  [tape], tetanus toxoid, succinylcholine, and contrast media [iodinated diagnostic agents].  MEDICATIONS:  Current Outpatient Medications  Medication Sig Dispense Refill   aspirin EC 81 MG tablet Take 1 tablet (81 mg total) by mouth 2 (two) times daily. For DVT prophylaxis for 30 days after surgery. 60 tablet 0   calcium carbonate (OS-CAL) 600 MG TABS tablet Take 600 mg by mouth daily with breakfast.     Multiple Vitamins-Minerals (MULTIVITAMIN WITH MINERALS) tablet Take 1 tablet by mouth daily.     tamoxifen (NOLVADEX) 20 MG tablet Take 1 tablet (20 mg total) by mouth daily. 90 tablet 3   Vitamin D, Cholecalciferol, 25 MCG (1000 UT) TABS Take 1 tablet by mouth daily. 60 tablet    No current facility-administered medications for this visit.    PHYSICAL EXAMINATION: ECOG PERFORMANCE STATUS: 1 - Symptomatic but completely ambulatory  Vitals:   05/14/21 1437  BP: 130/75  Pulse: 90  Resp: 18  Temp: 98.1 F (36.7 C)  SpO2: 100%   Filed Weights   05/14/21 1437  Weight: 185 lb 1.6 oz (84 kg)    BREAST: No palpable masses or nodules in either right or left breasts. No palpable axillary supraclavicular or infraclavicular adenopathy no breast tenderness or nipple discharge. (exam performed in the presence of a chaperone)  LABORATORY DATA:  I have reviewed the data as listed CMP Latest Ref Rng & Units 04/02/2021 04/01/2021 03/26/2021  Glucose 70 - 99 mg/dL 134(H) 112(H) 95  BUN 6 - 20 mg/dL '12 8 13  ' Creatinine 0.44 - 1.00  mg/dL 0.88 0.84 0.80  Sodium 135 - 145 mmol/L 137 138 136  Potassium 3.5 - 5.1 mmol/L 4.5 3.9 3.5  Chloride 98 - 111 mmol/L 106 106 102  CO2 22 - 32 mmol/L 20(L) 23 22  Calcium 8.9 - 10.3 mg/dL 9.4 9.8 9.5  Total Protein 6.5 - 8.1 g/dL - - 7.9  Total Bilirubin 0.3 - 1.2 mg/dL - - 0.5  Alkaline Phos 38 - 126 U/L - - 67  AST 15 - 41 U/L - - 18  ALT 0 - 44 U/L - - 16    Lab Results  Component Value Date   WBC 15.1 (H) 04/02/2021   HGB 10.5 (L) 04/02/2021   HCT  33.3 (L) 04/02/2021   MCV 91.2 04/02/2021   PLT 287 04/02/2021   NEUTROABS 10.9 (H) 03/26/2021    ASSESSMENT & PLAN:  Malignant neoplasm of upper-inner quadrant of left breast in female, estrogen receptor positive ([place]) 04/11/2020:Patient palpated a left breast lump. Mammogram on 03/08/20 showed a 3.0cm mass at the 11 o'clock position with no axillary adenopathy. 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  Patient refused adjuvant chemo, adjuvant radiation and adjuvant antiestrogen therapy.   Patient went to orthopedics for right hip pain.  Imaging revealed bone metastasis.  CT CAP: Cortical destruction of the right femoral neck consistent with skeletal metastases at risk for pathologic fracture.  Lucent lesion in the left iliac bone and L3 vertebral body consistent with multifocal skeletal metastases. --------------------------------------------------------------- 04/01/2021: Femoral intramedullary nail  Pathology review: Metastatic breast cancer ER 2% PR 0% HER2 3+ positive Treatment plan: Tamoxifen 20 mg started 04/08/2021, plan is for subcutaneous Herceptin Perjeta injection ([facility]) starting 05/14/2021 Palliative radiation to the iliac bone and hip: We will be starting some   She will need Xgeva as well to prevent bone fractures. Return to clinic in 3 weeks for cycle 2  No orders of the defined types were placed in this encounter.  The patient has a good understanding of the overall plan. she agrees with it. she will call with any problems that may develop before the next visit here.  Total time spent: 30 mins including face to face time and time spent for planning, charting and coordination of care  Rulon Eisenmenger, MD, MPH 05/14/2021  I, Thana Ates, am  acting as scribe for Dr. Nicholas Lose.  I have reviewed the above documentation for accuracy and completeness, and I agree with the above.

## 2021-05-14 ENCOUNTER — Inpatient Hospital Stay: Payer: 59 | Admitting: Hematology and Oncology

## 2021-05-14 ENCOUNTER — Inpatient Hospital Stay: Payer: 59

## 2021-05-14 ENCOUNTER — Telehealth: Payer: Self-pay

## 2021-05-14 ENCOUNTER — Other Ambulatory Visit: Payer: Self-pay

## 2021-05-14 VITALS — BP 110/49 | HR 73 | Resp 18

## 2021-05-14 DIAGNOSIS — Z170 Estrogen receptor positive status [ER+]: Secondary | ICD-10-CM

## 2021-05-14 DIAGNOSIS — C7951 Secondary malignant neoplasm of bone: Secondary | ICD-10-CM | POA: Diagnosis not present

## 2021-05-14 DIAGNOSIS — C50212 Malignant neoplasm of upper-inner quadrant of left female breast: Secondary | ICD-10-CM

## 2021-05-14 DIAGNOSIS — Z5112 Encounter for antineoplastic immunotherapy: Secondary | ICD-10-CM | POA: Diagnosis not present

## 2021-05-14 MED ORDER — DIPHENHYDRAMINE HCL 25 MG PO CAPS
25.0000 mg | ORAL_CAPSULE | Freq: Once | ORAL | Status: AC
  Administered 2021-05-14: 25 mg via ORAL
  Filled 2021-05-14: qty 1

## 2021-05-14 MED ORDER — ACETAMINOPHEN 325 MG PO TABS
650.0000 mg | ORAL_TABLET | Freq: Once | ORAL | Status: AC
  Administered 2021-05-14: 650 mg via ORAL
  Filled 2021-05-14: qty 2

## 2021-05-14 MED ORDER — PERTUZ-TRASTUZ-HYALURON-ZZXF 80-40-2000 MG-MG-U/ML CHEMO ~~LOC~~ SOLN
15.0000 mL | Freq: Once | SUBCUTANEOUS | Status: AC
  Administered 2021-05-14: 15 mL via SUBCUTANEOUS
  Filled 2021-05-14: qty 15

## 2021-05-14 NOTE — Telephone Encounter (Signed)
RN received call from Natalie Griffin, nurse case manager at [facility].   Natalie Griffin is requesting clinicals to expedite authorization for patient to receive Phesgo on 8/24.   RN forward message to [employer] in revenue department.    RN received voicemail from [place] that authorization has now been obtained and approved.   Information sent to [facility].

## 2021-05-14 NOTE — Patient Instructions (Signed)
[facility]  Discharge Instructions: Thank you for choosing [facility] to provide your oncology and hematology care.   If you have a lab appointment with the [facility], please go directly to the [facility] and check in at the registration area.   Wear comfortable clothing and clothing appropriate for easy access to any Portacath or PICC line.   We strive to give you quality time with your provider. You may need to reschedule your appointment if you arrive late (15 or more minutes).  Arriving late affects you and other patients whose appointments are after yours.  Also, if you miss three or more appointments without notifying the office, you may be dismissed from the clinic at the provider's discretion.      For prescription refill requests, have your pharmacy contact our office and allow 72 hours for refills to be completed.    Today you received the following chemotherapy and/or immunotherapy agents : phesgo      To help prevent nausea and vomiting after your treatment, we encourage you to take your nausea medication as directed.  BELOW ARE SYMPTOMS THAT SHOULD BE REPORTED IMMEDIATELY: *FEVER GREATER THAN 100.4 F (38 C) OR HIGHER *CHILLS OR SWEATING *NAUSEA AND VOMITING THAT IS NOT CONTROLLED WITH YOUR NAUSEA MEDICATION *UNUSUAL SHORTNESS OF BREATH *UNUSUAL BRUISING OR BLEEDING *URINARY PROBLEMS (pain or burning when urinating, or frequent urination) *BOWEL PROBLEMS (unusual diarrhea, constipation, pain near the anus) TENDERNESS IN MOUTH AND THROAT WITH OR WITHOUT PRESENCE OF ULCERS (sore throat, sores in mouth, or a toothache) UNUSUAL RASH, SWELLING OR PAIN  UNUSUAL VAGINAL DISCHARGE OR ITCHING   Items with * indicate a potential emergency and should be followed up as soon as possible or go to the Emergency Department if any problems should occur.  Please show the CHEMOTHERAPY ALERT CARD or IMMUNOTHERAPY ALERT CARD at check-in to  the Emergency Department and triage nurse.  Should you have questions after your visit or need to cancel or reschedule your appointment, please contact [facility]  Dept: [phone]  and follow the prompts.  Office hours are 8:00 a.m. to 4:30 p.m. Monday - Friday. Please note that voicemails left after 4:00 p.m. may not be returned until the following business day.  We are closed weekends and major holidays. You have access to a nurse at all times for urgent questions. Please call the main number to the clinic Dept: [phone] and follow the prompts.   For any non-urgent questions, you may also contact your provider using MyChart. We now offer e-Visits for anyone 61 and older to request care online for non-urgent symptoms. For details visit [email or web address].   Also download the MyChart app! Go to the app store, search "MyChart", open the app, select [facility], and log in with your MyChart username and password.  Due to Covid, a mask is required upon entering the hospital/clinic. If you do not have a mask, one will be given to you upon arrival. For doctor visits, patients may have 1 support person aged 79 or older with them. For treatment visits, patients cannot have anyone with them due to current Covid guidelines and our immunocompromised population.   Pertuzumab; Trastuzumab; Hyaluronidase Injection What is this medication? PERTUZUMAB (per TOOZ ue mab); TRASTUZUMAB (tras TOO zoo mab); HYALURONIDASE (hye al ur ON i dase) is used to treat breast cancer. Pertuzumab and trastuzumab are a monoclonal antibodies. Hyaluronidase is used to improve the effects of pertuzumab and  trastuzumab. This medicine may be used for other purposes; ask your health care provider or pharmacist if you have questions. COMMON BRAND NAME(S): PHESGO What should I tell my care team before I take this medication? They need to know if you have any of these conditions: heart  disease high blood pressure history of irregular heartbeat lung or breathing disease, like asthma an unusual or allergic reaction to pertuzumab, trastuzumab, hyaluronidase, other medicines, foods, dyes, or preservatives pregnant or trying to get pregnant breast-feeding How should I use this medication? This medicine is for injection under the skin. It may be given by a health care professional in a hospital or clinic setting or a health care professional may give you this medicine at home. Talk to your pediatrician regarding the use of this medicine in children. Special care may be needed. Overdosage: If you think you have taken too much of this medicine contact a poison control center or emergency room at once. NOTE: This medicine is only for you. Do not share this medicine with others. What if I miss a dose? It is important not to miss your dose. Call your doctor or health care professional if you are unable to keep an appointment. What may interact with this medication? This medicine may interact with the following medications: certain types of chemotherapy, such as daunorubicin, doxorubicin, epirubicin, and idarubicin This list may not describe all possible interactions. Give your health care provider a list of all the medicines, herbs, non-prescription drugs, or dietary supplements you use. Also tell them if you smoke, drink alcohol, or use illegal drugs. Some items may interact with your medicine. What should I watch for while using this medication? Visit your health care professional for regular checks on your progress. Tell your health care professional if your symptoms do not start to get better or if they get worse. Your condition will be monitored carefully while you are receiving this medicine. Do not become pregnant while taking this medicine or for 7 months after stopping it. Women should inform their doctor if they wish to become pregnant or think they might be pregnant. There is a  potential for serious side effects to an unborn child. Talk to your health care professional or pharmacist for more information. What side effects may I notice from receiving this medication? Side effects that you should report to your doctor or health care professional as soon as possible: allergic reactions like skin rash, itching or hives, swelling of the face, lips, or tongue breathing problems chest pain cough dizziness feeling faint; lightheaded, Griffin fever or chills loss of consciousness pain, tingling, numbness in the hands or feet palpitations sudden weight gain swelling of the ankles or legs unusually weak or tired Side effects that usually do not require medical attention (report these to your doctor or health care professional if they continue or are bothersome): diarrhea hair loss joint pain muscle pain nausea, vomiting pain, redness, or irritation at site where injected tiredness This list may not describe all possible side effects. Call your doctor for medical advice about side effects. You may report side effects to FDA at 1-800-FDA-1088. Where should I keep my medication? Keep out of the reach of children. If you are using this medicine at home, you will be instructed on how to store it. Throw away any unused medicine after the expiration date on the label. NOTE: This sheet is a summary. It may not cover all possible information. If you have questions about this medicine, talk to your  doctor, pharmacist, or health care provider.  2022 Elsevier/Gold Standard (2019-11-14 12:39:07)

## 2021-05-15 ENCOUNTER — Telehealth: Payer: Self-pay | Admitting: Radiation Oncology

## 2021-05-15 NOTE — Telephone Encounter (Signed)
The patient had been concerned about the amount of coverage of her femur for her treatment.  I called her to let her know that Dr. Lisbeth Renshaw would recommend that the standard approach would be to cover the entire area that the hardware occupied which in her case would be down to the level of the knee.  She was disappointed and did not feel that I had conveyed that from our previous discussion which I apologized for.  She plans to follow-up with a second opinion in [place] and will contact us to let us know how she would like to proceed.  Dr. Lisbeth Renshaw is willing to consider changes in the distance of the field depending on the patient's decision as long as the patient understands the possible limitations of not covering the entire area and the risks of local recurrence in the future.  She is in agreement and will contact us tomorrow to make a final decision.  She is also aware that if changes need to be made to her treatment plan this could alter her start date.    Carola Rhine, PAC

## 2021-05-19 ENCOUNTER — Other Ambulatory Visit: Payer: Self-pay

## 2021-05-19 ENCOUNTER — Telehealth: Payer: Self-pay

## 2021-05-19 ENCOUNTER — Ambulatory Visit
Admission: RE | Admit: 2021-05-19 | Discharge: 2021-05-19 | Disposition: A | Discharge status: Home / Self Care | Payer: 59 | Source: Ambulatory Visit | Attending: Radiation Oncology | Admitting: Radiation Oncology

## 2021-05-19 DIAGNOSIS — C50212 Malignant neoplasm of upper-inner quadrant of left female breast: Secondary | ICD-10-CM | POA: Diagnosis not present

## 2021-05-19 DIAGNOSIS — Z510 Encounter for antineoplastic radiation therapy: Secondary | ICD-10-CM | POA: Diagnosis not present

## 2021-05-19 DIAGNOSIS — Z170 Estrogen receptor positive status [ER+]: Secondary | ICD-10-CM | POA: Diagnosis not present

## 2021-05-19 DIAGNOSIS — C50912 Malignant neoplasm of unspecified site of left female breast: Secondary | ICD-10-CM | POA: Diagnosis not present

## 2021-05-19 DIAGNOSIS — C7951 Secondary malignant neoplasm of bone: Secondary | ICD-10-CM | POA: Diagnosis not present

## 2021-05-19 NOTE — Telephone Encounter (Signed)
Patient called to inquire about pain management.    History of metastatic breast cancer.    States, "I'm having pain in the right femoral neck." Having issues being able to complete PT due to pain.  Denies any injury.  Voices she is having difficulty even getting out of bed due to pain.  Reports pain to lower back, left side as well.    Patient inquiring to see if she can take something to help with pain. Will start palliative radiation.    Will review with MD for recommendations.

## 2021-05-20 ENCOUNTER — Other Ambulatory Visit: Payer: Self-pay | Admitting: Hematology and Oncology

## 2021-05-20 ENCOUNTER — Ambulatory Visit
Admission: RE | Admit: 2021-05-20 | Discharge: 2021-05-20 | Disposition: A | Discharge status: Home / Self Care | Payer: 59 | Source: Ambulatory Visit | Attending: Radiation Oncology | Admitting: Radiation Oncology

## 2021-05-20 DIAGNOSIS — Z510 Encounter for antineoplastic radiation therapy: Secondary | ICD-10-CM | POA: Diagnosis not present

## 2021-05-20 DIAGNOSIS — C50912 Malignant neoplasm of unspecified site of left female breast: Secondary | ICD-10-CM | POA: Diagnosis not present

## 2021-05-20 DIAGNOSIS — Z170 Estrogen receptor positive status [ER+]: Secondary | ICD-10-CM | POA: Diagnosis not present

## 2021-05-20 MED ORDER — TRAMADOL HCL 50 MG PO TABS: 50.0000 mg | ORAL_TABLET | Freq: Two times a day (BID) | ORAL | 0 refills | Status: DC | PRN

## 2021-05-20 NOTE — Progress Notes (Signed)
Pain issues in the hip: Sent a prescription for tramadol

## 2021-05-21 ENCOUNTER — Other Ambulatory Visit: Payer: Self-pay

## 2021-05-21 ENCOUNTER — Ambulatory Visit
Admission: RE | Admit: 2021-05-21 | Discharge: 2021-05-21 | Disposition: A | Discharge status: Home / Self Care | Payer: 59 | Source: Ambulatory Visit | Attending: Radiation Oncology | Admitting: Radiation Oncology

## 2021-05-21 DIAGNOSIS — Z170 Estrogen receptor positive status [ER+]: Secondary | ICD-10-CM | POA: Diagnosis not present

## 2021-05-21 DIAGNOSIS — C50912 Malignant neoplasm of unspecified site of left female breast: Secondary | ICD-10-CM | POA: Diagnosis not present

## 2021-05-21 DIAGNOSIS — M84451D Pathological fracture, right femur, subsequent encounter for fracture with routine healing: Secondary | ICD-10-CM | POA: Diagnosis not present

## 2021-05-21 DIAGNOSIS — Z510 Encounter for antineoplastic radiation therapy: Secondary | ICD-10-CM | POA: Diagnosis not present

## 2021-05-22 ENCOUNTER — Ambulatory Visit
Admission: RE | Admit: 2021-05-22 | Discharge: 2021-05-22 | Disposition: A | Discharge status: Home / Self Care | Payer: 59 | Source: Ambulatory Visit | Attending: Radiation Oncology | Admitting: Radiation Oncology

## 2021-05-22 DIAGNOSIS — D649 Anemia, unspecified: Secondary | ICD-10-CM | POA: Diagnosis not present

## 2021-05-22 DIAGNOSIS — C7951 Secondary malignant neoplasm of bone: Secondary | ICD-10-CM | POA: Diagnosis not present

## 2021-05-22 DIAGNOSIS — Z170 Estrogen receptor positive status [ER+]: Secondary | ICD-10-CM | POA: Insufficient documentation

## 2021-05-22 DIAGNOSIS — Z510 Encounter for antineoplastic radiation therapy: Secondary | ICD-10-CM | POA: Insufficient documentation

## 2021-05-22 DIAGNOSIS — C50212 Malignant neoplasm of upper-inner quadrant of left female breast: Secondary | ICD-10-CM | POA: Diagnosis not present

## 2021-05-22 DIAGNOSIS — R197 Diarrhea, unspecified: Secondary | ICD-10-CM | POA: Diagnosis not present

## 2021-05-22 DIAGNOSIS — C50912 Malignant neoplasm of unspecified site of left female breast: Secondary | ICD-10-CM | POA: Insufficient documentation

## 2021-05-22 DIAGNOSIS — Z5112 Encounter for antineoplastic immunotherapy: Secondary | ICD-10-CM | POA: Diagnosis not present

## 2021-05-23 ENCOUNTER — Other Ambulatory Visit: Payer: Self-pay

## 2021-05-23 ENCOUNTER — Ambulatory Visit
Admission: RE | Admit: 2021-05-23 | Discharge: 2021-05-23 | Disposition: A | Discharge status: Home / Self Care | Payer: 59 | Source: Ambulatory Visit | Attending: Radiation Oncology | Admitting: Radiation Oncology

## 2021-05-23 DIAGNOSIS — D649 Anemia, unspecified: Secondary | ICD-10-CM | POA: Diagnosis not present

## 2021-05-23 DIAGNOSIS — Z510 Encounter for antineoplastic radiation therapy: Secondary | ICD-10-CM | POA: Diagnosis not present

## 2021-05-23 DIAGNOSIS — C50212 Malignant neoplasm of upper-inner quadrant of left female breast: Secondary | ICD-10-CM | POA: Diagnosis not present

## 2021-05-23 DIAGNOSIS — Z170 Estrogen receptor positive status [ER+]: Secondary | ICD-10-CM | POA: Diagnosis not present

## 2021-05-23 DIAGNOSIS — R197 Diarrhea, unspecified: Secondary | ICD-10-CM | POA: Diagnosis not present

## 2021-05-23 DIAGNOSIS — Z5112 Encounter for antineoplastic immunotherapy: Secondary | ICD-10-CM | POA: Diagnosis not present

## 2021-05-23 DIAGNOSIS — C7951 Secondary malignant neoplasm of bone: Secondary | ICD-10-CM | POA: Diagnosis not present

## 2021-05-27 ENCOUNTER — Telehealth: Payer: Self-pay | Admitting: *Deleted

## 2021-05-27 ENCOUNTER — Other Ambulatory Visit: Payer: Self-pay

## 2021-05-27 ENCOUNTER — Ambulatory Visit
Admission: RE | Admit: 2021-05-27 | Discharge: 2021-05-27 | Disposition: A | Discharge status: Home / Self Care | Payer: 59 | Source: Ambulatory Visit | Attending: Radiation Oncology | Admitting: Radiation Oncology

## 2021-05-27 ENCOUNTER — Other Ambulatory Visit: Payer: Self-pay | Admitting: Hematology and Oncology

## 2021-05-27 DIAGNOSIS — Z5112 Encounter for antineoplastic immunotherapy: Secondary | ICD-10-CM | POA: Diagnosis not present

## 2021-05-27 DIAGNOSIS — C7951 Secondary malignant neoplasm of bone: Secondary | ICD-10-CM | POA: Diagnosis not present

## 2021-05-27 DIAGNOSIS — D649 Anemia, unspecified: Secondary | ICD-10-CM | POA: Diagnosis not present

## 2021-05-27 DIAGNOSIS — Z170 Estrogen receptor positive status [ER+]: Secondary | ICD-10-CM | POA: Diagnosis not present

## 2021-05-27 DIAGNOSIS — C50212 Malignant neoplasm of upper-inner quadrant of left female breast: Secondary | ICD-10-CM | POA: Diagnosis not present

## 2021-05-27 DIAGNOSIS — Z510 Encounter for antineoplastic radiation therapy: Secondary | ICD-10-CM | POA: Diagnosis not present

## 2021-05-27 DIAGNOSIS — R197 Diarrhea, unspecified: Secondary | ICD-10-CM | POA: Diagnosis not present

## 2021-05-27 MED ORDER — ALPRAZOLAM 0.25 MG PO TABS
0.2500 mg | ORAL_TABLET | Freq: Every evening | ORAL | 0 refills | Status: DC | PRN
Start: 2021-05-27 — End: 2021-06-20

## 2021-05-27 MED ORDER — HYDROCODONE-ACETAMINOPHEN 5-325 MG PO TABS: 1.0000 | ORAL_TABLET | Freq: Four times a day (QID) | ORAL | 0 refills | Status: DC | PRN

## 2021-05-27 NOTE — Progress Notes (Signed)
Worsening pain: Send for hydrocodone-Acetaminophen Anxiety: Send Xanax

## 2021-05-27 NOTE — Telephone Encounter (Signed)
Received call from pt with complaint of severe right hip pain not alleviated by Tramadol.  Pt states increase in pain also increases her anxiety causing pt to have difficulties preforming ADL's.  RN will review with MD for recommendations.

## 2021-05-28 ENCOUNTER — Ambulatory Visit
Admission: RE | Admit: 2021-05-28 | Discharge: 2021-05-28 | Disposition: A | Discharge status: Home / Self Care | Payer: 59 | Source: Ambulatory Visit | Attending: Radiation Oncology | Admitting: Radiation Oncology

## 2021-05-28 DIAGNOSIS — C7951 Secondary malignant neoplasm of bone: Secondary | ICD-10-CM | POA: Diagnosis not present

## 2021-05-28 DIAGNOSIS — Z170 Estrogen receptor positive status [ER+]: Secondary | ICD-10-CM | POA: Diagnosis not present

## 2021-05-28 DIAGNOSIS — C50212 Malignant neoplasm of upper-inner quadrant of left female breast: Secondary | ICD-10-CM | POA: Diagnosis not present

## 2021-05-28 DIAGNOSIS — R197 Diarrhea, unspecified: Secondary | ICD-10-CM | POA: Diagnosis not present

## 2021-05-28 DIAGNOSIS — Z510 Encounter for antineoplastic radiation therapy: Secondary | ICD-10-CM | POA: Diagnosis not present

## 2021-05-28 DIAGNOSIS — D649 Anemia, unspecified: Secondary | ICD-10-CM | POA: Diagnosis not present

## 2021-05-28 DIAGNOSIS — Z5112 Encounter for antineoplastic immunotherapy: Secondary | ICD-10-CM | POA: Diagnosis not present

## 2021-05-29 ENCOUNTER — Ambulatory Visit
Admission: RE | Admit: 2021-05-29 | Discharge: 2021-05-29 | Disposition: A | Discharge status: Home / Self Care | Payer: 59 | Source: Ambulatory Visit | Attending: Radiation Oncology | Admitting: Radiation Oncology

## 2021-05-29 ENCOUNTER — Other Ambulatory Visit: Payer: Self-pay

## 2021-05-29 DIAGNOSIS — R197 Diarrhea, unspecified: Secondary | ICD-10-CM | POA: Diagnosis not present

## 2021-05-29 DIAGNOSIS — Z170 Estrogen receptor positive status [ER+]: Secondary | ICD-10-CM | POA: Diagnosis not present

## 2021-05-29 DIAGNOSIS — C7951 Secondary malignant neoplasm of bone: Secondary | ICD-10-CM | POA: Diagnosis not present

## 2021-05-29 DIAGNOSIS — Z510 Encounter for antineoplastic radiation therapy: Secondary | ICD-10-CM | POA: Diagnosis not present

## 2021-05-29 DIAGNOSIS — C50212 Malignant neoplasm of upper-inner quadrant of left female breast: Secondary | ICD-10-CM | POA: Diagnosis not present

## 2021-05-29 DIAGNOSIS — D649 Anemia, unspecified: Secondary | ICD-10-CM | POA: Diagnosis not present

## 2021-05-29 DIAGNOSIS — Z5112 Encounter for antineoplastic immunotherapy: Secondary | ICD-10-CM | POA: Diagnosis not present

## 2021-05-30 ENCOUNTER — Other Ambulatory Visit: Payer: Self-pay

## 2021-05-30 ENCOUNTER — Ambulatory Visit
Admission: RE | Admit: 2021-05-30 | Discharge: 2021-05-30 | Disposition: A | Discharge status: Home / Self Care | Payer: 59 | Source: Ambulatory Visit | Attending: Radiation Oncology | Admitting: Radiation Oncology

## 2021-05-30 DIAGNOSIS — C7951 Secondary malignant neoplasm of bone: Secondary | ICD-10-CM | POA: Diagnosis not present

## 2021-05-30 DIAGNOSIS — D649 Anemia, unspecified: Secondary | ICD-10-CM | POA: Diagnosis not present

## 2021-05-30 DIAGNOSIS — R197 Diarrhea, unspecified: Secondary | ICD-10-CM | POA: Diagnosis not present

## 2021-05-30 DIAGNOSIS — C50212 Malignant neoplasm of upper-inner quadrant of left female breast: Secondary | ICD-10-CM | POA: Diagnosis not present

## 2021-05-30 DIAGNOSIS — Z510 Encounter for antineoplastic radiation therapy: Secondary | ICD-10-CM | POA: Diagnosis not present

## 2021-05-30 DIAGNOSIS — Z5112 Encounter for antineoplastic immunotherapy: Secondary | ICD-10-CM | POA: Diagnosis not present

## 2021-05-30 DIAGNOSIS — Z170 Estrogen receptor positive status [ER+]: Secondary | ICD-10-CM | POA: Diagnosis not present

## 2021-06-02 ENCOUNTER — Ambulatory Visit: Payer: 59

## 2021-06-03 ENCOUNTER — Other Ambulatory Visit: Payer: Self-pay

## 2021-06-03 ENCOUNTER — Encounter: Payer: Self-pay | Admitting: Radiation Oncology

## 2021-06-03 ENCOUNTER — Inpatient Hospital Stay: Payer: 59 | Attending: Hematology and Oncology

## 2021-06-03 ENCOUNTER — Other Ambulatory Visit: Payer: Self-pay | Admitting: *Deleted

## 2021-06-03 ENCOUNTER — Ambulatory Visit
Admission: RE | Admit: 2021-06-03 | Discharge: 2021-06-03 | Disposition: A | Discharge status: Home / Self Care | Payer: 59 | Source: Ambulatory Visit | Attending: Radiation Oncology | Admitting: Radiation Oncology

## 2021-06-03 ENCOUNTER — Encounter: Payer: Self-pay | Admitting: Hematology and Oncology

## 2021-06-03 DIAGNOSIS — Z5112 Encounter for antineoplastic immunotherapy: Secondary | ICD-10-CM | POA: Diagnosis not present

## 2021-06-03 DIAGNOSIS — C7951 Secondary malignant neoplasm of bone: Secondary | ICD-10-CM | POA: Insufficient documentation

## 2021-06-03 DIAGNOSIS — R197 Diarrhea, unspecified: Secondary | ICD-10-CM | POA: Diagnosis not present

## 2021-06-03 DIAGNOSIS — D649 Anemia, unspecified: Secondary | ICD-10-CM | POA: Diagnosis not present

## 2021-06-03 DIAGNOSIS — Z170 Estrogen receptor positive status [ER+]: Secondary | ICD-10-CM | POA: Insufficient documentation

## 2021-06-03 DIAGNOSIS — C50212 Malignant neoplasm of upper-inner quadrant of left female breast: Secondary | ICD-10-CM | POA: Diagnosis not present

## 2021-06-03 DIAGNOSIS — Z510 Encounter for antineoplastic radiation therapy: Secondary | ICD-10-CM | POA: Diagnosis not present

## 2021-06-03 LAB — CBC WITH DIFFERENTIAL (CANCER CENTER ONLY)
Abs Immature Granulocytes: 0.04 10*3/uL (ref 0.00–0.07)
Basophils Absolute: 0 10*3/uL (ref 0.0–0.1)
Basophils Relative: 0 %
Eosinophils Absolute: 0.2 10*3/uL (ref 0.0–0.5)
Eosinophils Relative: 3 %
HCT: 33.2 % — ABNORMAL LOW (ref 36.0–46.0)
Hemoglobin: 10.8 g/dL — ABNORMAL LOW (ref 12.0–15.0)
Immature Granulocytes: 0 %
Lymphocytes Relative: 18 %
Lymphs Abs: 1.6 10*3/uL (ref 0.7–4.0)
MCH: 28.3 pg (ref 26.0–34.0)
MCHC: 32.5 g/dL (ref 30.0–36.0)
MCV: 86.9 fL (ref 80.0–100.0)
Monocytes Absolute: 0.6 10*3/uL (ref 0.1–1.0)
Monocytes Relative: 6 %
Neutro Abs: 6.5 10*3/uL (ref 1.7–7.7)
Neutrophils Relative %: 73 %
Platelet Count: 309 10*3/uL (ref 150–400)
RBC: 3.82 MIL/uL — ABNORMAL LOW (ref 3.87–5.11)
RDW: 14.5 % (ref 11.5–15.5)
WBC Count: 8.9 10*3/uL (ref 4.0–10.5)
nRBC: 0 % (ref 0.0–0.2)

## 2021-06-03 LAB — CMP (CANCER CENTER ONLY)
ALT: 24 U/L (ref 0–44)
AST: 19 U/L (ref 15–41)
Albumin: 3.8 g/dL (ref 3.5–5.0)
Alkaline Phosphatase: 301 U/L — ABNORMAL HIGH (ref 38–126)
Anion gap: 9 (ref 5–15)
BUN: 9 mg/dL (ref 6–20)
CO2: 22 mmol/L (ref 22–32)
Calcium: 9.2 mg/dL (ref 8.9–10.3)
Chloride: 108 mmol/L (ref 98–111)
Creatinine: 0.8 mg/dL (ref 0.44–1.00)
GFR, Estimated: >60 mL/min (ref >60)
Glucose, Bld: 105 mg/dL — ABNORMAL HIGH (ref 70–99)
Potassium: 3.7 mmol/L (ref 3.5–5.1)
Sodium: 139 mmol/L (ref 135–145)
Total Bilirubin: 0.3 mg/dL (ref 0.3–1.2)
Total Protein: 7.6 g/dL (ref 6.5–8.1)

## 2021-06-03 LAB — IRON AND TIBC
Iron: 76 ug/dL (ref 28–170)
Saturation Ratios: 21 % (ref 10.4–31.8)
TIBC: 357 ug/dL (ref 250–450)
UIBC: 281 ug/dL

## 2021-06-03 LAB — FERRITIN: Ferritin: 25 ng/mL (ref 11–307)

## 2021-06-03 LAB — SAMPLE TO BLOOD BANK

## 2021-06-03 NOTE — Progress Notes (Signed)
                                                                                                                                                            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 She developed fatigue and anticipated skin changes in the treatment field. She noted loose stools toward the end of therapy and persistent pain in her right leg.  Plan: The patient will receive a call in about one month from the radiation oncology department. She will continue follow up with Dr. Lindi Adie as well.   ________________________________________________    Carola Rhine, [facility]

## 2021-06-04 ENCOUNTER — Encounter: Payer: Self-pay | Admitting: Hematology and Oncology

## 2021-06-04 NOTE — Progress Notes (Signed)
Patient Care Team: Pcp, No as PCP - General Rockwell [place], RN as Oncology Nurse Navigator Mauro Kaufmann, RN as Oncology Nurse Navigator  DIAGNOSIS:    ICD-10-CM   1. Malignant neoplasm of upper-inner quadrant of left breast in female, estrogen receptor positive ([facility])  C50.212    Z17.0       SUMMARY OF ONCOLOGIC HISTORY: Oncology History  Malignant neoplasm of upper-inner quadrant of left breast in female, estrogen receptor positive ([facility])  04/11/2020 Initial Diagnosis   Patient palpated a left breast lump. Mammogram on 03/08/20 showed a 3.0cm mass at the 11 o'clock position with no axillary adenopathy. 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   05/09/2020 Cancer Staging   Staging form: Breast, AJCC 8th Edition - Clinical stage from 05/09/2020: Stage IB (cT2, cN0, cM0, G2, ER+, PR+, HER2+) - Signed by Nicholas Lose, MD on 05/09/2020   06/04/2020 Surgery   Left lumpectomy ([name]): invasive and in situ ductal carcinoma, 3.2cm, clear margins, one left axillary lymph node negative for carcinoma.    02/2021 Relapse/Recurrence   Patient went to orthopedics for right hip pain.  Imaging revealed bone metastasis.  CT CAP: Cortical destruction of the right femoral neck consistent with skeletal metastases at risk for pathologic fracture.  Lucent lesion in the left iliac bone and L3 vertebral body consistent with multifocal skeletal metastases.   05/14/2021 - 05/14/2021 Chemotherapy            CHIEF COMPLIANT: Follow-up of left breast cancer on Phesgo  INTERVAL HISTORY: Natalie Griffin is a 45 y.o. with above-mentioned history of left breast cancer who underwent a left lumpectomy and completed radiation therapy. She presents to the clinic today for follow-up.  She received the first dose of Phesgo 3 weeks ago.  She had profound diarrhea for several weeks.  It improved with Imodium.  She drank lots of fluids to  keep up the hydration.  She also developed fixed drug eruption on the scalp and neck chest and back.  The rash has finally subsided.  ALLERGIES:  is allergic to adhesive [tape], tetanus toxoid, succinylcholine, and contrast media [iodinated diagnostic agents].  MEDICATIONS:  Current Outpatient Medications  Medication Sig Dispense Refill   ALPRAZolam (XANAX) 0.25 MG tablet Take 1 tablet (0.25 mg total) by mouth at bedtime as needed for anxiety. 30 tablet 0   aspirin EC 81 MG tablet Take 1 tablet (81 mg total) by mouth 2 (two) times daily. For DVT prophylaxis for 30 days after surgery. 60 tablet 0   calcium carbonate (OS-CAL) 600 MG TABS tablet Take 600 mg by mouth daily with breakfast.     HYDROcodone-acetaminophen (NORCO/VICODIN) 5-325 MG tablet Take 1 tablet by mouth every 6 (six) hours as needed for moderate pain. 60 tablet 0   Multiple Vitamins-Minerals (MULTIVITAMIN WITH MINERALS) tablet Take 1 tablet by mouth daily.     tamoxifen (NOLVADEX) 20 MG tablet Take 1 tablet (20 mg total) by mouth daily. 90 tablet 3   traMADol (ULTRAM) 50 MG tablet Take 1 tablet (50 mg total) by mouth every 12 (twelve) hours as needed. 60 tablet 0   Vitamin D, Cholecalciferol, 25 MCG (1000 UT) TABS Take 1 tablet by mouth daily. 60 tablet    No current facility-administered medications for this visit.    PHYSICAL EXAMINATION: ECOG PERFORMANCE STATUS: 1 - Symptomatic but completely ambulatory  Vitals:   06/05/21 1501  BP: 131/66  Pulse: 76  Resp: 18  Temp: (!) 97.5 F (36.4 C)  SpO2: 99%   Filed Weights   06/05/21 1501  Weight: 216 lb 4.8 oz (98.1 kg)      LABORATORY DATA:  I have reviewed the data as listed CMP Latest Ref Rng & Units 06/03/2021 04/02/2021 04/01/2021  Glucose 70 - 99 mg/dL 105(H) 134(H) 112(H)  BUN 6 - 20 mg/dL '9 12 8  ' Creatinine 0.44 - 1.00 mg/dL 0.80 0.88 0.84  Sodium 135 - 145 mmol/L 139 137 138  Potassium 3.5 - 5.1 mmol/L 3.7 4.5 3.9  Chloride 98 - 111 mmol/L 108 106 106   CO2 22 - 32 mmol/L 22 20(L) 23  Calcium 8.9 - 10.3 mg/dL 9.2 9.4 9.8  Total Protein 6.5 - 8.1 g/dL 7.6 - -  Total Bilirubin 0.3 - 1.2 mg/dL 0.3 - -  Alkaline Phos 38 - 126 U/L 301(H) - -  AST 15 - 41 U/L 19 - -  ALT 0 - 44 U/L 24 - -    Lab Results  Component Value Date   WBC 8.9 06/03/2021   HGB 10.8 (L) 06/03/2021   HCT 33.2 (L) 06/03/2021   MCV 86.9 06/03/2021   PLT 309 06/03/2021   NEUTROABS 6.5 06/03/2021    ASSESSMENT & PLAN:  Malignant neoplasm of upper-inner quadrant of left breast in female, estrogen receptor positive ([facility]) 04/11/2020:Patient palpated a left breast lump. Mammogram on 03/08/20 showed a 3.0cm mass at the 11 o'clock position with no axillary adenopathy. 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  Patient refused adjuvant chemo, adjuvant radiation and adjuvant antiestrogen therapy.   Patient went to orthopedics for right hip pain.  Imaging revealed bone metastasis.  CT CAP: Cortical destruction of the right femoral neck consistent with skeletal metastases at risk for pathologic fracture.  Lucent lesion in the left iliac bone and L3 vertebral body consistent with multifocal skeletal metastases. --------------------------------------------------------------- 04/01/2021: Femoral intramedullary nail  Pathology review: Metastatic breast cancer ER 2% PR 0% HER2 3+ positive Treatment plan: Tamoxifen 20 mg started 04/08/2021, subcutaneous Herceptin Perjeta injection (Phesgo) started 05/14/2021  Palliative radiation to the iliac bone and hip: 05/20/2021-06/03/2021   Phesgo side effect: Fixed drug eruption, profound diarrhea Therefore we will discontinue Phesgo and start her on Herceptin Hylecta Inj  She will need Xgeva as well to prevent bone  fractures.  She will think about it and inform us of her decision She will come back in a week for the injection and I will see her with cycle 2.    No orders of the defined types were placed in this encounter.  The patient has a good understanding of the overall plan. she agrees with it. she will call with any problems that may develop before the next visit here.  Total time spent: 30 mins including face to face time and time spent for planning, charting and coordination of care  Rulon Eisenmenger, MD, MPH 06/05/2021  I, Thana Ates, am acting as scribe for Dr. Nicholas Lose.  I have reviewed the above documentation for accuracy and completeness, and I agree with the above.

## 2021-06-05 ENCOUNTER — Inpatient Hospital Stay: Payer: 59 | Admitting: Hematology and Oncology

## 2021-06-05 ENCOUNTER — Inpatient Hospital Stay: Payer: 59

## 2021-06-05 ENCOUNTER — Other Ambulatory Visit: Payer: Self-pay

## 2021-06-05 DIAGNOSIS — C50212 Malignant neoplasm of upper-inner quadrant of left female breast: Secondary | ICD-10-CM

## 2021-06-05 DIAGNOSIS — Z170 Estrogen receptor positive status [ER+]: Secondary | ICD-10-CM | POA: Diagnosis not present

## 2021-06-05 DIAGNOSIS — R197 Diarrhea, unspecified: Secondary | ICD-10-CM | POA: Diagnosis not present

## 2021-06-05 DIAGNOSIS — C7951 Secondary malignant neoplasm of bone: Secondary | ICD-10-CM | POA: Diagnosis not present

## 2021-06-05 DIAGNOSIS — Z510 Encounter for antineoplastic radiation therapy: Secondary | ICD-10-CM | POA: Diagnosis not present

## 2021-06-05 DIAGNOSIS — D649 Anemia, unspecified: Secondary | ICD-10-CM | POA: Diagnosis not present

## 2021-06-05 DIAGNOSIS — Z5112 Encounter for antineoplastic immunotherapy: Secondary | ICD-10-CM | POA: Diagnosis not present

## 2021-06-05 MED ORDER — HYDROCODONE-ACETAMINOPHEN 5-325 MG PO TABS: 1.0000 | ORAL_TABLET | Freq: Four times a day (QID) | ORAL | 0 refills | Status: DC | PRN

## 2021-06-05 NOTE — Assessment & Plan Note (Signed)
04/11/2020:Patient palpated a left breast lump. Mammogram on 03/08/20 showed a 3.0cm mass at the 11 o'clock position with no axillary adenopathy. 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 Patient refused adjuvant chemo, adjuvant radiation and adjuvant antiestrogen therapy.  Patient went to orthopedics for right hip pain. Imaging revealed bone metastasis. CT CAP: Cortical destruction of the right femoral neck consistent with skeletal metastases at risk for pathologic fracture. Lucent lesion in the left iliac bone and L3 vertebral body consistent with multifocal skeletal metastases. --------------------------------------------------------------- 04/01/2021: Femoral intramedullary nail Pathology review: Metastatic breast cancer ER 2% PR 0% HER2 3+ positive Treatment plan: Tamoxifen 20 mg started 04/08/2021,plan is for subcutaneous Herceptin Perjeta injection (Phesgo) started 05/14/2021  Palliative radiation to the iliac bone and hip: 05/20/2021-06/03/2021  She will need Xgeva as well to prevent bone fractures. Return to clinic in 3 weeks for cycle 3

## 2021-06-06 ENCOUNTER — Encounter: Payer: Self-pay | Admitting: Hematology and Oncology

## 2021-06-06 ENCOUNTER — Other Ambulatory Visit: Payer: Self-pay | Admitting: Hematology and Oncology

## 2021-06-06 DIAGNOSIS — C50212 Malignant neoplasm of upper-inner quadrant of left female breast: Secondary | ICD-10-CM

## 2021-06-06 NOTE — Progress Notes (Signed)
DISCONTINUE ON PATHWAY REGIMEN - Breast     A cycle is every 21 days:     Pertuzumab      Pertuzumab      Trastuzumab-xxxx      Trastuzumab-xxxx   **Always confirm dose/schedule in your pharmacy ordering system**  REASON: Toxicities / Adverse Event PRIOR TREATMENT: [id number]: Trastuzumab IV + Pertuzumab IV q21 Days TREATMENT RESPONSE: Unable to Evaluate  START ON PATHWAY REGIMEN - Breast     Cycle 1: A cycle is 21 days:     Trastuzumab-xxxx    Cycles 2 and beyond: A cycle is every 21 days:     Trastuzumab-xxxx   **Always confirm dose/schedule in your pharmacy ordering system**  Patient Characteristics: Distant Metastases or Locoregional Recurrent Disease - Unresected or Locally Advanced Unresectable Disease Progressing after Neoadjuvant and Local Therapies, HER2 Positive, ER Positive, HER2-Targeted Therapy (Concurrent with Endocrine Therapy) Therapeutic Status: Distant Metastases HER2 Status: Positive (+) ER Status: Positive (+) PR Status: Negative (-) Intent of Therapy: Non-Curative / Palliative Intent, Discussed with Patient

## 2021-06-11 ENCOUNTER — Encounter: Payer: Self-pay | Admitting: Hematology and Oncology

## 2021-06-11 ENCOUNTER — Inpatient Hospital Stay: Payer: 59

## 2021-06-11 ENCOUNTER — Other Ambulatory Visit: Payer: Self-pay

## 2021-06-11 VITALS — BP 128/88 | HR 59 | Temp 98.4°F | Resp 18 | Wt 189.5 lb

## 2021-06-11 DIAGNOSIS — C50212 Malignant neoplasm of upper-inner quadrant of left female breast: Secondary | ICD-10-CM

## 2021-06-11 DIAGNOSIS — Z5112 Encounter for antineoplastic immunotherapy: Secondary | ICD-10-CM | POA: Diagnosis not present

## 2021-06-11 DIAGNOSIS — C7951 Secondary malignant neoplasm of bone: Secondary | ICD-10-CM | POA: Diagnosis not present

## 2021-06-11 DIAGNOSIS — R197 Diarrhea, unspecified: Secondary | ICD-10-CM | POA: Diagnosis not present

## 2021-06-11 DIAGNOSIS — Z510 Encounter for antineoplastic radiation therapy: Secondary | ICD-10-CM | POA: Diagnosis not present

## 2021-06-11 DIAGNOSIS — Z170 Estrogen receptor positive status [ER+]: Secondary | ICD-10-CM | POA: Diagnosis not present

## 2021-06-11 DIAGNOSIS — D649 Anemia, unspecified: Secondary | ICD-10-CM | POA: Diagnosis not present

## 2021-06-11 MED ORDER — ACETAMINOPHEN 325 MG PO TABS
650.0000 mg | ORAL_TABLET | Freq: Once | ORAL | Status: AC
  Administered 2021-06-11: 650 mg via ORAL
  Filled 2021-06-11: qty 2

## 2021-06-11 MED ORDER — DIPHENHYDRAMINE HCL 25 MG PO CAPS
50.0000 mg | ORAL_CAPSULE | Freq: Once | ORAL | Status: AC
Start: 2021-06-11 — End: 2021-06-11
  Administered 2021-06-11: 50 mg via ORAL
  Filled 2021-06-11: qty 2

## 2021-06-11 MED ORDER — TRASTUZUMAB-HYALURONIDASE-OYSK 600-10000 MG-UNT/5ML ~~LOC~~ SOLN
600.0000 mg | Freq: Once | SUBCUTANEOUS | Status: AC
  Administered 2021-06-11: 600 mg via SUBCUTANEOUS
  Filled 2021-06-11: qty 5

## 2021-06-11 NOTE — Patient Instructions (Signed)
[facility]COLOG[facility]  Discharge Instructions: Thank you for choosing [facility] to provide your oncology and hematology care.   If you have a lab appointment with the Cancer Center, please go directly to the Cancer Center and check in at the registration area.   Wear comfortable clothing and clothing appropriate for easy access to any Portacath or PICC line.   We strive to give you quality time with your provider. You may need to reschedule your appointment if you arrive late (15 or more minutes).  Arriving late affects you and other patients whose appointments are after yours.  Also, if you miss three or more appointments without notifying the office, you may be dismissed from the clinic at the provider's discretion.      For prescription refill requests, have your pharmacy contact our office and allow 72 hours for refills to be completed.    Today you received the following chemotherapy and/or immunotherapy agents herceptin hylecta    To help prevent nausea and vomiting after your treatment, we encourage you to take your nausea medication as directed.  BELOW ARE SYMPTOMS THAT SHOULD BE REPORTED IMMEDIATELY: . *FEVER GREATER THAN 100.4 F (38 C) OR HIGHER . *CHILLS OR SWEATING . *NAUSEA AND VOMITING THAT IS NOT CONTROLLED WITH YOUR NAUSEA MEDICATION . *UNUSUAL SHORTNESS OF BREATH . *UNUSUAL BRUISING OR BLEEDING . *URINARY PROBLEMS (pain or burning when urinating, or frequent urination) . *BOWEL PROBLEMS (unusual diarrhea, constipation, pain near the anus) . TENDERNESS IN MOUTH AND THROAT WITH OR WITHOUT PRESENCE OF ULCERS (sore throat, sores in mouth, or a toothache) . UNUSUAL RASH, SWELLING OR PAIN  . UNUSUAL VAGINAL DISCHARGE OR ITCHING   Items with * indicate a potential emergency and should be followed up as soon as possible or go to the Emergency Department if any problems should occur.  Please show the CHEMOTHERAPY ALERT CARD or IMMUNOTHERAPY  ALERT CARD at check-in to the Emergency Department and triage nurse.  Should you have questions after your visit or need to cancel or reschedule your appointment, please contact [facility]  Dept: [phone]  and follow the prompts.  Office hours are 8:00 a.m. to 4:30 p.m. Monday - Friday. Please note that voicemails left after 4:00 p.m. may not be returned until the following business day.  We are closed weekends and major holidays. You have access to a nurse at all times for urgent questions. Please call the main number to the clinic Dept: [phone] and follow the prompts.   For any non-urgent questions, you may also contact your provider using MyChart. We now offer e-Visits for anyone 18 and older to request care online for non-urgent symptoms. For details visit [email or web address].   Also download the MyChart app! Go to the app store, search "MyChart", open the app, select [facility], and log in with your MyChart username and password.  Due to Covid, a mask is required upon entering the hospital/clinic. If you do not have a mask, one will be given to you upon arrival. For doctor visits, patients may have 1 support person aged 18 or older with them. For treatment visits, patients cannot have anyone with them due to current Covid guidelines and our immunocompromised population.   

## 2021-06-13 NOTE — Progress Notes (Signed)
Hypersensitivity Reaction note  Date of event: 06/05/2021 Time of event: reported by patient that her reaction began after leaving the cancer center after her first dose of Phesgo. She did not report this to anyone at the cancer center until her appointment on 06/05/21. Generic name of drug involved: [place] Name of provider notified of the hypersensitivity reaction: [name] Was agent that likely caused hypersensitivity reaction added to Allergies List within EMR? yes Chain of events including reaction signs/symptoms, treatment administered, and outcome (e.g., drug resumed; drug discontinued; sent to Emergency Department; etc.) Per MD note: She received the first dose of Phesgo 3 weeks ago.  She had profound diarrhea for several weeks.  It improved with Imodium.  She drank lots of fluids to keep up the hydration.  She also developed fixed drug eruption on the scalp and neck chest and back.  The rash has finally subsided.  Gillian Shields, RN

## 2021-06-18 DIAGNOSIS — M25561 Pain in right knee: Secondary | ICD-10-CM | POA: Diagnosis not present

## 2021-06-18 DIAGNOSIS — M84451D Pathological fracture, right femur, subsequent encounter for fracture with routine healing: Secondary | ICD-10-CM | POA: Diagnosis not present

## 2021-06-19 ENCOUNTER — Encounter: Payer: Self-pay | Admitting: Hematology and Oncology

## 2021-06-19 ENCOUNTER — Other Ambulatory Visit: Payer: Self-pay | Admitting: Hematology and Oncology

## 2021-06-19 MED ORDER — HYDROCODONE-ACETAMINOPHEN 5-325 MG PO TABS: 1.0000 | ORAL_TABLET | Freq: Four times a day (QID) | ORAL | 0 refills | Status: DC | PRN

## 2021-06-20 ENCOUNTER — Other Ambulatory Visit: Payer: Self-pay | Admitting: Hematology and Oncology

## 2021-06-20 ENCOUNTER — Telehealth: Payer: Self-pay | Admitting: Hematology and Oncology

## 2021-06-20 MED ORDER — HYDROCODONE-ACETAMINOPHEN 5-325 MG PO TABS: 1.0000 | ORAL_TABLET | Freq: Four times a day (QID) | ORAL | 0 refills | Status: DC | PRN

## 2021-06-20 NOTE — Telephone Encounter (Signed)
Per 9/30 schedule not, spoke with pt about rescheduling pt decided to keep appt on 10/12

## 2021-06-25 ENCOUNTER — Ambulatory Visit: Payer: 59 | Admitting: Hematology and Oncology

## 2021-06-25 ENCOUNTER — Ambulatory Visit: Payer: 59

## 2021-06-30 ENCOUNTER — Ambulatory Visit
Admission: RE | Admit: 2021-06-30 | Discharge: 2021-06-30 | Disposition: A | Discharge status: Home / Self Care | Payer: 59 | Source: Ambulatory Visit | Attending: Radiation Oncology | Admitting: Radiation Oncology

## 2021-06-30 DIAGNOSIS — Z170 Estrogen receptor positive status [ER+]: Secondary | ICD-10-CM | POA: Insufficient documentation

## 2021-06-30 DIAGNOSIS — C50212 Malignant neoplasm of upper-inner quadrant of left female breast: Secondary | ICD-10-CM | POA: Insufficient documentation

## 2021-06-30 NOTE — Progress Notes (Signed)
  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 During treatment she did very well with radiotherapy and did not have significant desquamation. She denies concerns about radiation at this time and is scheduled later this week to see Dr. Lindi Adie.  Impression/Plan: 1. 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 The patient did not have concerns today and reports that she will also continue to follow up with Dr. Lindi Adie in medical oncology. We will be happy to see her as needed moving forward.      Carola Rhine, PAC

## 2021-07-01 NOTE — Progress Notes (Signed)
Patient Care Team: Pcp, No as PCP - General Rockwell [place], RN as Oncology Nurse Navigator Mauro Kaufmann, RN as Oncology Nurse Navigator  DIAGNOSIS:    ICD-10-CM   1. Malignant neoplasm of upper-inner quadrant of left breast in female, estrogen receptor positive ([place])  C50.212    Z17.0       SUMMARY OF ONCOLOGIC HISTORY: Oncology History  Malignant neoplasm of upper-inner quadrant of left breast in female, estrogen receptor positive ([place])  04/11/2020 Initial Diagnosis   Patient palpated a left breast lump. Mammogram on 03/08/20 showed a 3.0cm mass at the 11 o'clock position with no axillary adenopathy. 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   05/09/2020 Cancer Staging   Staging form: Breast, AJCC 8th Edition - Clinical stage from 05/09/2020: Stage IB (cT2, cN0, cM0, G2, ER+, PR+, HER2+) - Signed by Nicholas Lose, MD on 05/09/2020   06/04/2020 Surgery   Left lumpectomy ([name]): invasive and in situ ductal carcinoma, 3.2cm, clear margins, one left axillary lymph node negative for carcinoma.    02/2021 Relapse/Recurrence   Patient went to orthopedics for right hip pain.  Imaging revealed bone metastasis.  CT CAP: Cortical destruction of the right femoral neck consistent with skeletal metastases at risk for pathologic fracture.  Lucent lesion in the left iliac bone and L3 vertebral body consistent with multifocal skeletal metastases.   06/11/2021 -  Chemotherapy   Patient is on Treatment Plan : BREAST Trastuzumab SQ (Hylecta) q21d       CHIEF COMPLIANT: Follow-up of left breast cancer on Herceptin Hylecta  INTERVAL HISTORY: Natalie Griffin is a 45 y.o. with above-mentioned history of left breast cancer who underwent a left lumpectomy and completed radiation therapy. She presents to the clinic today for follow-up.  Today she is able to walk without a cane or a walker because hip pain is improving  gradually.  However she rolled over and felt pain in her left rib cage and since then its been hurting a lot.  It is possible that she may have broken a rib.  ALLERGIES:  is allergic to adhesive [tape], phesgo [pertuz-trastuz-hyaluron-zzxf], tetanus toxoid, succinylcholine, and contrast media [iodinated diagnostic agents].  MEDICATIONS:  Current Outpatient Medications  Medication Sig Dispense Refill   calcium carbonate (OS-CAL) 600 MG TABS tablet Take 600 mg by mouth daily with breakfast.     HYDROcodone-acetaminophen (NORCO/VICODIN) 5-325 MG tablet Take 1-2 tablets by mouth every 6 (six) hours as needed for moderate pain. 120 tablet 0   Multiple Vitamins-Minerals (MULTIVITAMIN WITH MINERALS) tablet Take 1 tablet by mouth daily.     tamoxifen (NOLVADEX) 20 MG tablet Take 1 tablet (20 mg total) by mouth daily. 90 tablet 3   Vitamin D, Cholecalciferol, 25 MCG (1000 UT) TABS Take 1 tablet by mouth daily. 60 tablet    No current facility-administered medications for this visit.    PHYSICAL EXAMINATION: ECOG PERFORMANCE STATUS: 1 - Symptomatic but completely ambulatory  Vitals:   07/02/21 0931  BP: 123/76  Pulse: 98  Resp: 18  Temp: 97.9 F (36.6 C)  SpO2: 98%   Filed Weights   07/02/21 0931  Weight: 190 lb 11.2 oz (86.5 kg)      LABORATORY DATA:  I have reviewed the data as listed CMP Latest Ref Rng & Units 06/03/2021 04/02/2021 04/01/2021  Glucose 70 - 99 mg/dL 105(H) 134(H) 112(H)  BUN 6 - 20 mg/dL 9 12 8  Creatinine 0.44 - 1.00 mg/dL 0.80 0.88 0.84  Sodium 135 - 145 mmol/L 139 137 138  Potassium 3.5 - 5.1 mmol/L 3.7 4.5 3.9  Chloride 98 - 111 mmol/L 108 106 106  CO2 22 - 32 mmol/L 22 20(L) 23  Calcium 8.9 - 10.3 mg/dL 9.2 9.4 9.8  Total Protein 6.5 - 8.1 g/dL 7.6 - -  Total Bilirubin 0.3 - 1.2 mg/dL 0.3 - -  Alkaline Phos 38 - 126 U/L 301(H) - -  AST 15 - 41 U/L 19 - -  ALT 0 - 44 U/L 24 - -    Lab Results  Component Value Date   WBC 8.9 06/03/2021   HGB 10.8 (L)  06/03/2021   HCT 33.2 (L) 06/03/2021   MCV 86.9 06/03/2021   PLT 309 06/03/2021   NEUTROABS 6.5 06/03/2021    ASSESSMENT & PLAN:  Malignant neoplasm of upper-inner quadrant of left breast in female, estrogen receptor positive ([place]) 04/11/2020:Patient palpated a left breast lump. Mammogram on 03/08/20 showed a 3.0cm mass at the 11 o'clock position with no axillary adenopathy. 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  Patient refused adjuvant chemo, adjuvant radiation and adjuvant antiestrogen therapy.   Patient went to orthopedics for right hip pain.  Imaging revealed bone metastasis.  CT CAP: Cortical destruction of the right femoral neck consistent with skeletal metastases at risk for pathologic fracture.  Lucent lesion in the left iliac bone and L3 vertebral body consistent with multifocal skeletal metastases. --------------------------------------------------------------- 04/01/2021: Femoral intramedullary nail  Pathology review: Metastatic breast cancer ER 2% PR 0% HER2 3+ positive Treatment plan: Tamoxifen 20 mg was prescribed on 04/08/2021 (discontinued by patient because she felt that the risks and benefits did not support it), subcutaneous Herceptin Perjeta (Phesgo) injection (Phesgo) started 05/14/2021, switched to Herceptin Hylecta 06/11/2021 (Fixed drug eruption, profound diarrhea to Phesgo) Palliative radiation to the iliac bone and hip: 05/20/2021-06/03/2021   Herceptin Hylecta Inj toxicities: No major toxicities from Herceptin.  The acneform rash on the face is being monitored. Possible rib fracture: We will assess this with the bone scan. We will obtain a bone scan before the next treatment.    No orders of the defined types were placed in this  encounter.  The patient has a good understanding of the overall plan. she agrees with it. she will call with any problems that may develop before the next visit here.  Total time spent: 30 mins including face to face time and time spent for planning, charting and coordination of care  Rulon Eisenmenger, MD, MPH 07/02/2021  I, Thana Ates, am acting as scribe for Dr. Nicholas Lose.  I have reviewed the above documentation for accuracy and completeness, and I agree with the above.

## 2021-07-02 ENCOUNTER — Inpatient Hospital Stay: Payer: 59 | Attending: Hematology and Oncology

## 2021-07-02 ENCOUNTER — Inpatient Hospital Stay: Payer: 59 | Admitting: Hematology and Oncology

## 2021-07-02 ENCOUNTER — Other Ambulatory Visit: Payer: Self-pay

## 2021-07-02 VITALS — BP 123/76 | HR 98 | Temp 97.9°F | Resp 18 | Ht 65.0 in | Wt 190.7 lb

## 2021-07-02 DIAGNOSIS — C50212 Malignant neoplasm of upper-inner quadrant of left female breast: Secondary | ICD-10-CM | POA: Insufficient documentation

## 2021-07-02 DIAGNOSIS — Z170 Estrogen receptor positive status [ER+]: Secondary | ICD-10-CM

## 2021-07-02 DIAGNOSIS — C7951 Secondary malignant neoplasm of bone: Secondary | ICD-10-CM | POA: Diagnosis not present

## 2021-07-02 DIAGNOSIS — Z5112 Encounter for antineoplastic immunotherapy: Secondary | ICD-10-CM | POA: Insufficient documentation

## 2021-07-02 DIAGNOSIS — M84551G Pathological fracture in neoplastic disease, right femur, subsequent encounter for fracture with delayed healing: Secondary | ICD-10-CM

## 2021-07-02 MED ORDER — DIPHENHYDRAMINE HCL 25 MG PO CAPS
50.0000 mg | ORAL_CAPSULE | Freq: Once | ORAL | Status: AC
  Administered 2021-07-02: 50 mg via ORAL
  Filled 2021-07-02: qty 2

## 2021-07-02 MED ORDER — TRASTUZUMAB-HYALURONIDASE-OYSK 600-10000 MG-UNT/5ML ~~LOC~~ SOLN
600.0000 mg | Freq: Once | SUBCUTANEOUS | Status: AC
  Administered 2021-07-02: 600 mg via SUBCUTANEOUS
  Filled 2021-07-02: qty 5

## 2021-07-02 MED ORDER — ACETAMINOPHEN 325 MG PO TABS
650.0000 mg | ORAL_TABLET | Freq: Once | ORAL | Status: AC
  Administered 2021-07-02: 650 mg via ORAL
  Filled 2021-07-02: qty 2

## 2021-07-02 NOTE — Patient Instructions (Signed)
[facility] MEDICAL [facility]COLOG[facility]  Discharge Instructions: Thank you for choosing [facility] to provide your oncology and hematology care.   If you have a lab appointment with the Cancer Center, please go directly to the Cancer Center and check in at the registration area.   Wear comfortable clothing and clothing appropriate for easy access to any Portacath or PICC line.   We strive to give you quality time with your provider. You may need to reschedule your appointment if you arrive late (15 or more minutes).  Arriving late affects you and other patients whose appointments are after yours.  Also, if you miss three or more appointments without notifying the office, you may be dismissed from the clinic at the provider's discretion.      For prescription refill requests, have your pharmacy contact our office and allow 72 hours for refills to be completed.    Today you received the following chemotherapy and/or immunotherapy agents : Herceptin     To help prevent nausea and vomiting after your treatment, we encourage you to take your nausea medication as directed.  BELOW ARE SYMPTOMS THAT SHOULD BE REPORTED IMMEDIATELY: *FEVER GREATER THAN 100.4 F (38 C) OR HIGHER *CHILLS OR SWEATING *NAUSEA AND VOMITING THAT IS NOT CONTROLLED WITH YOUR NAUSEA MEDICATION *UNUSUAL SHORTNESS OF BREATH *UNUSUAL BRUISING OR BLEEDING *URINARY PROBLEMS (pain or burning when urinating, or frequent urination) *BOWEL PROBLEMS (unusual diarrhea, constipation, pain near the anus) TENDERNESS IN MOUTH AND THROAT WITH OR WITHOUT PRESENCE OF ULCERS (sore throat, sores in mouth, or a toothache) UNUSUAL RASH, SWELLING OR PAIN  UNUSUAL VAGINAL DISCHARGE OR ITCHING   Items with * indicate a potential emergency and should be followed up as soon as possible or go to the Emergency Department if any problems should occur.  Please show the CHEMOTHERAPY ALERT CARD or IMMUNOTHERAPY ALERT CARD at check-in to  the Emergency Department and triage nurse.  Should you have questions after your visit or need to cancel or reschedule your appointment, please contact [facility]  Dept: [phone]  and follow the prompts.  Office hours are 8:00 a.m. to 4:30 p.m. Monday - Friday. Please note that voicemails left after 4:00 p.m. may not be returned until the following business day.  We are closed weekends and major holidays. You have access to a nurse at all times for urgent questions. Please call the main number to the clinic Dept: [phone] and follow the prompts.   For any non-urgent questions, you may also contact your provider using MyChart. We now offer e-Visits for anyone 18 and older to request care online for non-urgent symptoms. For details visit [email or web address].   Also download the MyChart app! Go to the app store, search "MyChart", open the app, select [facility], and log in with your MyChart username and password.  Due to Covid, a mask is required upon entering the hospital/clinic. If you do not have a mask, one will be given to you upon arrival. For doctor visits, patients may have 1 support person aged 18 or older with them. For treatment visits, patients cannot have anyone with them due to current Covid guidelines and our immunocompromised population.   

## 2021-07-02 NOTE — Assessment & Plan Note (Signed)
04/11/2020:Patient palpated a left breast lump. Mammogram on 03/08/20 showed a 3.0cm mass at the 11 o'clock position with no axillary adenopathy. 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 Patient refused adjuvant chemo, adjuvant radiation and adjuvant antiestrogen therapy.  Patient went to orthopedics for right hip pain. Imaging revealed bone metastasis. CT CAP: Cortical destruction of the right femoral neck consistent with skeletal metastases at risk for pathologic fracture. Lucent lesion in the left iliac bone and L3 vertebral body consistent with multifocal skeletal metastases. --------------------------------------------------------------- 04/01/2021: Femoral intramedullary nail Pathology review: Metastatic breast cancer ER 2% PR 0% HER2 3+ positive Treatment plan: Tamoxifen 20 mg started 04/08/2021,subcutaneous Herceptin Perjeta (Phesgo) injection (Phesgo)started 05/14/2021, switched to Herceptin Hylecta 06/11/2021 (Fixed drug eruption, profound diarrhea to Phesgo) Palliative radiation to the iliac bone and hip: 05/20/2021-06/03/2021  Herceptin Hylecta Inj toxicities:  She will need Xgeva as well to prevent bone fractures.  She will think about it and inform us of her decision

## 2021-07-07 ENCOUNTER — Encounter: Payer: Self-pay | Admitting: Hematology and Oncology

## 2021-07-15 NOTE — Progress Notes (Signed)
OK to d/c Herceptin Hylecta premeds per Dr. Lindi Adie.  Pt tol 2 doses SQ Hylecta w/o difficulty.  Kennith Center, Pharm.D., CPP 07/15/2021@3 :38 PM

## 2021-07-16 ENCOUNTER — Ambulatory Visit: Payer: 59 | Admitting: Hematology and Oncology

## 2021-07-16 ENCOUNTER — Ambulatory Visit: Payer: 59

## 2021-07-18 ENCOUNTER — Other Ambulatory Visit: Payer: Self-pay | Admitting: Hematology and Oncology

## 2021-07-20 ENCOUNTER — Other Ambulatory Visit: Payer: Self-pay | Admitting: Hematology and Oncology

## 2021-07-21 ENCOUNTER — Encounter: Payer: Self-pay | Admitting: Hematology and Oncology

## 2021-07-21 MED ORDER — HYDROCODONE-ACETAMINOPHEN 5-325 MG PO TABS: 1.0000 | ORAL_TABLET | Freq: Four times a day (QID) | ORAL | 0 refills | Status: DC | PRN

## 2021-07-21 NOTE — Progress Notes (Signed)
Patient Care Team: Pcp, No as PCP - General Rockwell Germany, RN as Oncology Nurse Navigator Mauro Kaufmann, RN as Oncology Nurse Navigator  DIAGNOSIS:    ICD-10-CM   1. Malignant neoplasm of upper-inner quadrant of left breast in female, estrogen receptor positive ([place])  C50.212 ECHOCARDIOGRAM COMPLETE   Z17.0       SUMMARY OF ONCOLOGIC HISTORY: Oncology History  Malignant neoplasm of upper-inner quadrant of left breast in female, estrogen receptor positive ([place])  04/11/2020 Initial Diagnosis   Patient palpated a left breast lump. Mammogram on 03/08/20 showed a 3.0cm mass at the 11 o'clock position with no axillary adenopathy. 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   05/09/2020 Cancer Staging   Staging form: Breast, AJCC 8th Edition - Clinical stage from 05/09/2020: Stage IB (cT2, cN0, cM0, G2, ER+, PR+, HER2+) - Signed by Nicholas Lose, MD on 05/09/2020    06/04/2020 Surgery   Left lumpectomy ([name]): invasive and in situ ductal carcinoma, 3.2cm, clear margins, one left axillary lymph node negative for carcinoma.    02/2021 Relapse/Recurrence   Patient went to orthopedics for right hip pain.  Imaging revealed bone metastasis.  CT CAP: Cortical destruction of the right femoral neck consistent with skeletal metastases at risk for pathologic fracture.  Lucent lesion in the left iliac bone and L3 vertebral body consistent with multifocal skeletal metastases.   06/11/2021 -  Chemotherapy   Patient is on Treatment Plan : BREAST Trastuzumab SQ (Hylecta) q21d       CHIEF COMPLIANT: Follow-up of left breast cancer on Herceptin Hylecta  INTERVAL HISTORY: Natalie Griffin is a 45 y.o. with above-mentioned history of left breast cancer who underwent a left lumpectomy and completed radiation therapy. She presents to the clinic today for follow-up.  She is tolerating Herceptin extremely well without any problems  or concerns.  She does have mild fatigue.  She has moved all of her furniture from a [place] and drove back in a [employer].  She is very tired from this moving.  ALLERGIES:  is allergic to adhesive [tape], phesgo [pertuz-trastuz-hyaluron-zzxf], tetanus toxoid, succinylcholine, and contrast media [iodinated diagnostic agents].  MEDICATIONS:  Current Outpatient Medications  Medication Sig Dispense Refill   calcium carbonate (OS-CAL) 600 MG TABS tablet Take 600 mg by mouth daily with breakfast.     HYDROcodone-acetaminophen (NORCO/VICODIN) 5-325 MG tablet Take 1-2 tablets by mouth every 6 (six) hours as needed for moderate pain. 120 tablet 0   Multiple Vitamins-Minerals (MULTIVITAMIN WITH MINERALS) tablet Take 1 tablet by mouth daily.     tamoxifen (NOLVADEX) 20 MG tablet Take 1 tablet (20 mg total) by mouth daily. 90 tablet 3   Vitamin D, Cholecalciferol, 25 MCG (1000 UT) TABS Take 1 tablet by mouth daily. 60 tablet    No current facility-administered medications for this visit.    PHYSICAL EXAMINATION: ECOG PERFORMANCE STATUS: 1 - Symptomatic but completely ambulatory  Vitals:   07/22/21 1439  BP: 116/61  Pulse: 73  Resp: 18  Temp: 97.9 F (36.6 C)  SpO2: 97%   Filed Weights   07/22/21 1439  Weight: 189 lb 9.6 oz (86 kg)      LABORATORY DATA:  I have reviewed the data as listed CMP Latest Ref Rng & Units 06/03/2021 04/02/2021 04/01/2021  Glucose 70 - 99 mg/dL 105(H) 134(H) 112(H)  BUN 6 - 20 mg/dL _0 Creatinine 0.44 - 1.00 mg/dL  0.80 0.88 0.84  Sodium 135 - 145 mmol/L 139 137 138  Potassium 3.5 - 5.1 mmol/L 3.7 4.5 3.9  Chloride 98 - 111 mmol/L 108 106 106  CO2 22 - 32 mmol/L 22 20(L) 23  Calcium 8.9 - 10.3 mg/dL 9.2 9.4 9.8  Total Protein 6.5 - 8.1 g/dL 7.6 - -  Total Bilirubin 0.3 - 1.2 mg/dL 0.3 - -  Alkaline Phos 38 - 126 U/L 301(H) - -  AST 15 - 41 U/L 19 - -  ALT 0 - 44 U/L 24 - -    Lab Results  Component Value Date   WBC 8.9 06/03/2021   HGB 10.8 (L)  06/03/2021   HCT 33.2 (L) 06/03/2021   MCV 86.9 06/03/2021   PLT 309 06/03/2021   NEUTROABS 6.5 06/03/2021    ASSESSMENT & PLAN:  Malignant neoplasm of upper-inner quadrant of left breast in female, estrogen receptor positive ([place]) 04/11/2020:Patient palpated a left breast lump. Mammogram on 03/08/20 showed a 3.0cm mass at the 11 o'clock position with no axillary adenopathy. 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  Patient refused adjuvant chemo, adjuvant radiation and adjuvant antiestrogen therapy.   Patient went to orthopedics for right hip pain.  Imaging revealed bone metastasis.  CT CAP: Cortical destruction of the right femoral neck consistent with skeletal metastases at risk for pathologic fracture.  Lucent lesion in the left iliac bone and L3 vertebral body consistent with multifocal skeletal metastases. --------------------------------------------------------------- 04/01/2021: Femoral intramedullary nail  Pathology review: Metastatic breast cancer ER 2% PR 0% HER2 3+ positive  Treatment plan: Tamoxifen 20 mg was prescribed on 04/08/2021 (discontinued by patient because she felt that the risks and benefits did not support it), subcutaneous Herceptin Perjeta (Phesgo) injection (Phesgo) started 05/14/2021, switched to Herceptin Hylecta 06/11/2021 (Fixed drug eruption, profound diarrhea to Phesgo) Palliative radiation to the iliac bone and hip: 05/20/2021-06/03/2021   Herceptin Hylecta Inj toxicities: No major toxicities from Herceptin.  The acneform rash on the face is being monitored. Possible rib fracture: We will assess this with the bone scan. We will obtain a bone scan before the next treatment.  Continue with every 3-week Herceptin.  Orders Placed This  Encounter  Procedures   ECHOCARDIOGRAM COMPLETE    Standing Status:   Future    Standing Expiration Date:   07/22/2022    Order Specific Question:   Where should this test be performed    Answer:   [facility]    Order Specific Question:   Perflutren DEFINITY (image enhancing agent) should be administered unless hypersensitivity or allergy exist    Answer:   Administer Perflutren    Order Specific Question:   Reason for exam-Echo    Answer:   Chemo  Z09    Order Specific Question:   Release to patient    Answer:   Immediate   The patient has a good understanding of the overall plan. she agrees with it. she will call with any problems that may develop before the next visit here.  Total time spent: 30 mins including face to face time and time spent for planning, charting and coordination of care  Rulon Eisenmenger, MD, MPH 07/22/2021  I, Thana Ates, am acting as scribe for Dr. Nicholas Lose.  I have reviewed the above documentation  for accuracy and completeness, and I agree with the above.

## 2021-07-21 NOTE — Telephone Encounter (Signed)
Please refuse.  This was refilled this morning and the pharmacy sent another request.  RN can not refuse narcotics.

## 2021-07-21 NOTE — Assessment & Plan Note (Signed)
04/11/2020:Patient palpated a left breast lump. Mammogram on 03/08/20 showed a 3.0cm mass at the 11 o'clock position with no axillary adenopathy. 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 Patient refused adjuvant chemo, adjuvant radiation and adjuvant antiestrogen therapy.  Patient went to orthopedics for right hip pain. Imaging revealed bone metastasis. CT CAP: Cortical destruction of the right femoral neck consistent with skeletal metastases at risk for pathologic fracture. Lucent lesion in the left iliac bone and L3 vertebral body consistent with multifocal skeletal metastases. --------------------------------------------------------------- 04/01/2021: Femoral intramedullary nail Pathology review: Metastatic breast cancer ER 2% PR 0% HER2 3+ positive Treatment plan: Tamoxifen 20 mg was prescribed on 04/08/2021 (discontinued by patient because she felt that the risks and benefits did not support it),subcutaneous Herceptin Perjeta (Phesgo) injection (Phesgo)started8/24/2022, switched to Herceptin Hylecta 06/11/2021 (Fixed drug eruption, profound diarrhea to Phesgo) Palliative radiation to the iliac bone and hip:05/20/2021-06/03/2021  HerceptinHylecta Inj toxicities: No major toxicities from Herceptin.  The acneform rash on the face is being monitored. Possible rib fracture: We will assess this with the bone scan. We will obtain a bone scan before the next treatment.

## 2021-07-22 ENCOUNTER — Inpatient Hospital Stay (HOSPITAL_BASED_OUTPATIENT_CLINIC_OR_DEPARTMENT_OTHER): Payer: 59 | Admitting: Hematology and Oncology

## 2021-07-22 ENCOUNTER — Inpatient Hospital Stay: Payer: 59 | Attending: Hematology and Oncology

## 2021-07-22 ENCOUNTER — Other Ambulatory Visit: Payer: Self-pay

## 2021-07-22 VITALS — BP 107/66 | HR 54 | Temp 98.1°F | Resp 16

## 2021-07-22 DIAGNOSIS — Z5112 Encounter for antineoplastic immunotherapy: Secondary | ICD-10-CM | POA: Diagnosis not present

## 2021-07-22 DIAGNOSIS — C7951 Secondary malignant neoplasm of bone: Secondary | ICD-10-CM | POA: Diagnosis not present

## 2021-07-22 DIAGNOSIS — Z170 Estrogen receptor positive status [ER+]: Secondary | ICD-10-CM

## 2021-07-22 DIAGNOSIS — C50212 Malignant neoplasm of upper-inner quadrant of left female breast: Secondary | ICD-10-CM | POA: Insufficient documentation

## 2021-07-22 MED ORDER — TRASTUZUMAB-HYALURONIDASE-OYSK 600-10000 MG-UNT/5ML ~~LOC~~ SOLN
600.0000 mg | Freq: Once | SUBCUTANEOUS | Status: AC
  Administered 2021-07-22: 600 mg via SUBCUTANEOUS
  Filled 2021-07-22: qty 5

## 2021-07-22 NOTE — Progress Notes (Signed)
Patient was observed for 30 minutes post injection with no adverse reaction. Vitals stable and patient in no distress upon leaving infusion room.

## 2021-07-22 NOTE — Patient Instructions (Signed)
[facility]   Discharge Instructions: Thank you for choosing [facility] to provide your oncology and hematology care.   If you have a lab appointment with the [facility], please go directly to the [facility] and check in at the registration area.   Wear comfortable clothing and clothing appropriate for easy access to any Portacath or PICC line.   We strive to give you quality time with your provider. You may need to reschedule your appointment if you arrive late (15 or more minutes).  Arriving late affects you and other patients whose appointments are after yours.  Also, if you miss three or more appointments without notifying the office, you may be dismissed from the clinic at the provider's discretion.      For prescription refill requests, have your pharmacy contact our office and allow 72 hours for refills to be completed.    Today you received the following chemotherapy and/or immunotherapy agents: Trastuzumab - hyaluronidase (Herceptin hylecta)      To help prevent nausea and vomiting after your treatment, we encourage you to take your nausea medication as directed.  BELOW ARE SYMPTOMS THAT SHOULD BE REPORTED IMMEDIATELY: *FEVER GREATER THAN 100.4 F (38 C) OR HIGHER *CHILLS OR SWEATING *NAUSEA AND VOMITING THAT IS NOT CONTROLLED WITH YOUR NAUSEA MEDICATION *UNUSUAL SHORTNESS OF BREATH *UNUSUAL BRUISING OR BLEEDING *URINARY PROBLEMS (pain or burning when urinating, or frequent urination) *BOWEL PROBLEMS (unusual diarrhea, constipation, pain near the anus) TENDERNESS IN MOUTH AND THROAT WITH OR WITHOUT PRESENCE OF ULCERS (sore throat, sores in mouth, or a toothache) UNUSUAL RASH, SWELLING OR PAIN  UNUSUAL VAGINAL DISCHARGE OR ITCHING   Items with * indicate a potential emergency and should be followed up as soon as possible or go to the Emergency Department if any problems should occur.  Please show the CHEMOTHERAPY ALERT CARD or  IMMUNOTHERAPY ALERT CARD at check-in to the Emergency Department and triage nurse.  Should you have questions after your visit or need to cancel or reschedule your appointment, please contact [facility]  Dept: [phone]  and follow the prompts.  Office hours are 8:00 a.m. to 4:30 p.m. Monday - Friday. Please note that voicemails left after 4:00 p.m. may not be returned until the following business day.  We are closed weekends and major holidays. You have access to a nurse at all times for urgent questions. Please call the main number to the clinic Dept: [phone] and follow the prompts.   For any non-urgent questions, you may also contact your provider using MyChart. We now offer e-Visits for anyone 33 and older to request care online for non-urgent symptoms. For details visit [email or web address].   Also download the MyChart app! Go to the app store, search "MyChart", open the app, select [facility], and log in with your MyChart username and password.  Due to Covid, a mask is required upon entering the hospital/clinic. If you do not have a mask, one will be given to you upon arrival. For doctor visits, patients may have 1 support person aged 54 or older with them. For treatment visits, patients cannot have anyone with them due to current Covid guidelines and our immunocompromised population.

## 2021-07-23 ENCOUNTER — Ambulatory Visit: Payer: 59

## 2021-07-23 DIAGNOSIS — M84451D Pathological fracture, right femur, subsequent encounter for fracture with routine healing: Secondary | ICD-10-CM | POA: Diagnosis not present

## 2021-07-28 ENCOUNTER — Encounter: Payer: Self-pay | Admitting: Hematology and Oncology

## 2021-07-28 MED ORDER — HYDROCODONE-ACETAMINOPHEN 5-325 MG PO TABS: 1.0000 | ORAL_TABLET | Freq: Four times a day (QID) | ORAL | 0 refills | Status: DC | PRN

## 2021-07-30 ENCOUNTER — Encounter (HOSPITAL_COMMUNITY)
Admission: RE | Admit: 2021-07-30 | Discharge: 2021-07-30 | Disposition: A | Discharge status: Home / Self Care | Payer: 59 | Source: Ambulatory Visit | Attending: Hematology and Oncology | Admitting: Hematology and Oncology

## 2021-07-30 ENCOUNTER — Encounter: Payer: Self-pay | Admitting: Hematology and Oncology

## 2021-07-30 ENCOUNTER — Other Ambulatory Visit: Payer: Self-pay

## 2021-07-30 DIAGNOSIS — C50919 Malignant neoplasm of unspecified site of unspecified female breast: Secondary | ICD-10-CM | POA: Diagnosis not present

## 2021-07-30 DIAGNOSIS — Z170 Estrogen receptor positive status [ER+]: Secondary | ICD-10-CM | POA: Diagnosis not present

## 2021-07-30 DIAGNOSIS — C7951 Secondary malignant neoplasm of bone: Secondary | ICD-10-CM | POA: Diagnosis not present

## 2021-07-30 DIAGNOSIS — C50212 Malignant neoplasm of upper-inner quadrant of left female breast: Secondary | ICD-10-CM | POA: Diagnosis not present

## 2021-07-30 DIAGNOSIS — M84551G Pathological fracture in neoplastic disease, right femur, subsequent encounter for fracture with delayed healing: Secondary | ICD-10-CM | POA: Diagnosis not present

## 2021-07-30 IMAGING — NM NM BONE WHOLE BODY
2 series · 2 of 2 positions shown · non-contrast
Comparison: Bone scan [DATE]

CLINICAL DATA: Breast cancer, assess treatment response

EXAM:
NUCLEAR MEDICINE WHOLE BODY BONE SCAN
TECHNIQUE: Whole body anterior and posterior images were obtained approximately
3 hours after intravenous injection of radiopharmaceutical.
RADIOPHARMACEUTICALS:  20.9 mCi [9L] MDP IV

[Series 1: whole body · 2.66mm/px · 1 of 1 slices shown (1 of 2)]
[im 1/1]
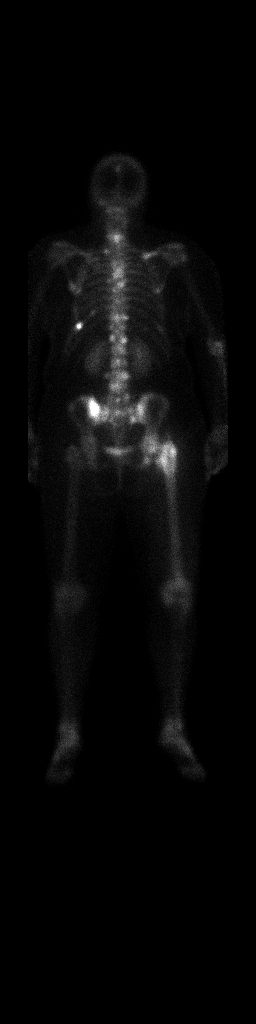

[Series 1: whole body · 2.66mm/px · 1 of 1 slices shown (2 of 2)]
[im 1/1]
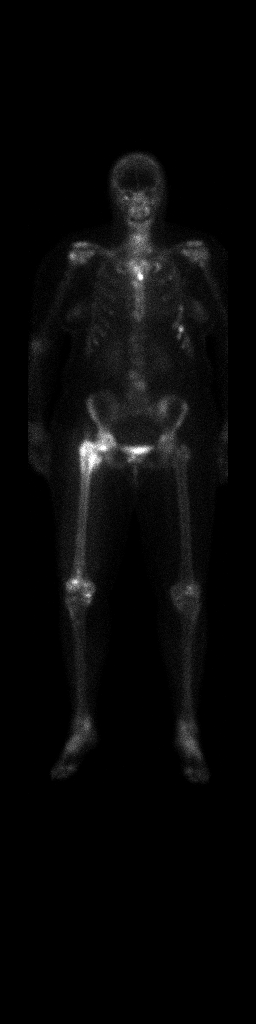

[2 of 2 positions shown; findings below may reference images not displayed]

FINDINGS: New lesion in the sternum at the sternomanubrial junction. New
lesion in the anterior medial LEFT lower rib. Probable new lesion in
the RIGHT humeral head.

On posterior projection, there are multiple new lesions within the
cervicothoracic and lumbar spine. Similar activity in the RIGHT
iliac bone centrally.

Increased radiotracer accumulation in the proximal RIGHT femur
related to internal fixation of the RIGHT hip.
IMPRESSION: 1. Progression of skeletal metastasis with multiple new lesions
within the cervical, thoracic and lumbar spine. Additional new
lesions in the sternum, LEFT rib, RIGHT humerus.
2. Increased uptake in the RIGHT femur related to intramedullary
nail fixation

## 2021-07-30 MED ORDER — TECHNETIUM TC 99M MEDRONATE IV KIT
20.0000 | PACK | Freq: Once | INTRAVENOUS | Status: AC | PRN
  Administered 2021-07-30: 20.9 via INTRAVENOUS

## 2021-07-31 ENCOUNTER — Ambulatory Visit (HOSPITAL_COMMUNITY)
Admission: RE | Admit: 2021-07-31 | Discharge: 2021-07-31 | Disposition: A | Discharge status: Home / Self Care | Payer: 59 | Source: Ambulatory Visit | Attending: Hematology and Oncology | Admitting: Hematology and Oncology

## 2021-07-31 DIAGNOSIS — Z0189 Encounter for other specified special examinations: Secondary | ICD-10-CM | POA: Diagnosis not present

## 2021-07-31 DIAGNOSIS — Z01810 Encounter for preprocedural cardiovascular examination: Secondary | ICD-10-CM | POA: Insufficient documentation

## 2021-07-31 DIAGNOSIS — Z170 Estrogen receptor positive status [ER+]: Secondary | ICD-10-CM | POA: Insufficient documentation

## 2021-07-31 DIAGNOSIS — C50212 Malignant neoplasm of upper-inner quadrant of left female breast: Secondary | ICD-10-CM | POA: Diagnosis not present

## 2021-07-31 LAB — ECHOCARDIOGRAM COMPLETE
Area-P 1/2: 3.2 cm2
S' Lateral: 3.3 cm

## 2021-07-31 NOTE — Progress Notes (Signed)
  Echocardiogram 2D Echocardiogram has been performed.  Natalie Griffin 07/31/2021, 9:26 AM

## 2021-08-01 ENCOUNTER — Encounter: Payer: Self-pay | Admitting: Hematology and Oncology

## 2021-08-02 NOTE — Progress Notes (Signed)
Patient Care Team: Pcp, No as PCP - [facility] Rockwell [place], RN as Oncology Nurse Navigator Mauro Kaufmann, RN as Oncology Nurse Navigator  DIAGNOSIS:    ICD-10-CM   1. Malignant neoplasm of upper-inner quadrant of left breast in female, estrogen receptor positive ([place])  C50.212    Z17.0       SUMMARY OF ONCOLOGIC HISTORY: Oncology History  Malignant neoplasm of upper-inner quadrant of left breast in female, estrogen receptor positive ([place])  04/11/2020 Initial Diagnosis   Patient palpated a left breast lump. Mammogram on 03/08/20 showed a 3.0cm mass at the 11 o'clock position with no axillary adenopathy. 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   05/09/2020 Cancer Staging   Staging form: Breast, AJCC 8th Edition - Clinical stage from 05/09/2020: Stage IB (cT2, cN0, cM0, G2, ER+, PR+, HER2+) - Signed by Nicholas Lose, MD on 05/09/2020    06/04/2020 Surgery   Left lumpectomy ([name]): invasive and in situ ductal carcinoma, 3.2cm, clear margins, one left axillary lymph node negative for carcinoma.    02/2021 Relapse/Recurrence   Patient went to orthopedics for right hip pain.  Imaging revealed bone metastasis.  CT CAP: Cortical destruction of the right femoral neck consistent with skeletal metastases at risk for pathologic fracture.  Lucent lesion in the left iliac bone and L3 vertebral body consistent with multifocal skeletal metastases.   06/11/2021 -  Chemotherapy   Patient is on Treatment Plan : BREAST Trastuzumab SQ (Hylecta) q21d       CHIEF COMPLIANT: Follow-up of left breast cancer on Herceptin Hylecta  INTERVAL HISTORY: Natalie Griffin is a 45 y.o. with above-mentioned history of left breast cancer who underwent a left lumpectomy and completed radiation therapy. Whole body bone scan on 07/30/2021 showed progression of skeletal metastasis with multiple new lesions within the cervical, thoracic and  lumbar spine, and additional new lesions in the sternum, left rib, right humerus. She presents to the clinic today for follow-up.  She reports that the pain in the ribs is continuing to get worse.  She is not getting any relief from the pain medication.  The right hip pain is also radiating down the legs.  ALLERGIES:  is allergic to adhesive [tape], phesgo [pertuz-trastuz-hyaluron-zzxf], tetanus toxoid, succinylcholine, and contrast media [iodinated diagnostic agents].  MEDICATIONS:  Current Outpatient Medications  Medication Sig Dispense Refill   calcium carbonate (OS-CAL) 600 MG TABS tablet Take 600 mg by mouth daily with breakfast.     HYDROcodone-acetaminophen (NORCO/VICODIN) 5-325 MG tablet Take 1-2 tablets by mouth every 6 (six) hours as needed for moderate pain. 120 tablet 0   HYDROcodone-acetaminophen (NORCO/VICODIN) 5-325 MG tablet Take 1-2 tablets by mouth every 6 (six) hours as needed for moderate pain. 120 tablet 0   Multiple Vitamins-Minerals (MULTIVITAMIN WITH MINERALS) tablet Take 1 tablet by mouth daily.     tamoxifen (NOLVADEX) 20 MG tablet Take 1 tablet (20 mg total) by mouth daily. 90 tablet 3   Vitamin D, Cholecalciferol, 25 MCG (1000 UT) TABS Take 1 tablet by mouth daily. 60 tablet    No current facility-administered medications for this visit.    PHYSICAL EXAMINATION: ECOG PERFORMANCE STATUS: 1 - Symptomatic but completely ambulatory  Vitals:   08/04/21 1137  BP: 115/79  Pulse: (!) 107  Resp: 18  Temp: (!) 97.5 F (36.4 C)  SpO2: 98%   Filed Weights   08/04/21 1137  Weight: 187 lb 3.2  oz (84.9 kg)      LABORATORY DATA:  I have reviewed the data as listed CMP Latest Ref Rng & Units 06/03/2021 04/02/2021 04/01/2021  Glucose 70 - 99 mg/dL 105(H) 134(H) 112(H)  BUN 6 - 20 mg/dL '9 12 8  ' Creatinine 0.44 - 1.00 mg/dL 0.80 0.88 0.84  Sodium 135 - 145 mmol/L 139 137 138  Potassium 3.5 - 5.1 mmol/L 3.7 4.5 3.9  Chloride 98 - 111 mmol/L 108 106 106  CO2 22 - 32  mmol/L 22 20(L) 23  Calcium 8.9 - 10.3 mg/dL 9.2 9.4 9.8  Total Protein 6.5 - 8.1 g/dL 7.6 - -  Total Bilirubin 0.3 - 1.2 mg/dL 0.3 - -  Alkaline Phos 38 - 126 U/L 301(H) - -  AST 15 - 41 U/L 19 - -  ALT 0 - 44 U/L 24 - -    Lab Results  Component Value Date   WBC 8.9 06/03/2021   HGB 10.8 (L) 06/03/2021   HCT 33.2 (L) 06/03/2021   MCV 86.9 06/03/2021   PLT 309 06/03/2021   NEUTROABS 6.5 06/03/2021    ASSESSMENT & PLAN:  Malignant neoplasm of upper-inner quadrant of left breast in female, estrogen receptor positive ([place]) 04/11/2020:Patient palpated a left breast lump. Mammogram on 03/08/20 showed a 3.0cm mass at the 11 o'clock position with no axillary adenopathy. 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  Patient refused adjuvant chemo, adjuvant radiation and adjuvant antiestrogen therapy.   Patient went to orthopedics for right hip pain.  Imaging revealed bone metastasis.  CT CAP: Cortical destruction of the right femoral neck consistent with skeletal metastases at risk for pathologic fracture.  Lucent lesion in the left iliac bone and L3 vertebral body consistent with multifocal skeletal metastases. --------------------------------------------------------------- 04/01/2021: Femoral intramedullary nail  Pathology review: Metastatic breast cancer ER 2% PR 0% HER2 3+ positive   Treatment plan: Tamoxifen 20 mg was prescribed on 04/08/2021 (discontinued by patient because she felt that the risks and benefits did not support it), subcutaneous Herceptin Perjeta (Phesgo) injection (Phesgo) started 05/14/2021, switched to Herceptin Hylecta 06/11/2021 (Fixed drug eruption, profound diarrhea to Phesgo) Palliative radiation to the iliac bone and hip: 05/20/2021-06/03/2021    Herceptin Hylecta Inj toxicities: No major toxicities from Herceptin.  Possible rib fracture: Bone Scan 08/01/21: Progression of bone mets with new lesions within cervical, thoracic and lumbar spine. New leisons in sternum, left rib and Rt Humerus.  Discussed entire spectrum of treatments for metastatic breast cancer that is ER/PR and HER2 positive Recommended that she start tamoxifen in addition to Herceptin infusions.  She is willing to take it at this time.   Pain: Sent prescription for Dilaudid and discontinued Vicodin.  Also sent for gabapentin. Severe anxiety: Sent prescription for Xanax Will help connect her to our chaplain and social workers regarding living will, DPOA and DNR paperwork. Patient does not want to be resuscitated.  CT chest abdomen pelvis will be ordered stat to evaluate her symptoms. Return to clinic after that to discuss the CT scans.   No orders of the defined types were placed in this encounter.  The patient has a good understanding of the overall plan. she agrees with it. she will call with any problems that may develop before the next visit here.  Total time spent:  45 mins including face to face time and time spent for planning, charting and coordination of care  Natalie Eisenmenger, MD, MPH 08/04/2021  I, Thana Ates, am acting as scribe for Dr. Nicholas Lose.  I have reviewed the above documentation for accuracy and completeness, and I agree with the above.

## 2021-08-03 NOTE — Assessment & Plan Note (Signed)
04/11/2020:Patient palpated a left breast lump. Mammogram on 03/08/20 showed a 3.0cm mass at the 11 o'clock position with no axillary adenopathy. 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 Patient refused adjuvant chemo, adjuvant radiation and adjuvant antiestrogen therapy.  Patient went to orthopedics for right hip pain. Imaging revealed bone metastasis. CT CAP: Cortical destruction of the right femoral neck consistent with skeletal metastases at risk for pathologic fracture. Lucent lesion in the left iliac bone and L3 vertebral body consistent with multifocal skeletal metastases. --------------------------------------------------------------- 04/01/2021: Femoral intramedullary nail Pathology review: Metastatic breast cancer ER 2% PR 0% HER2 3+ positive  Treatment plan: Tamoxifen 20 mgwas prescribed on7/19/2022(discontinued by patient because she felt that the risks and benefits did not support it),subcutaneous Herceptin Perjeta(Phesgo)injection (Phesgo)started8/24/2022, switched to Herceptin Hylecta 06/11/2021 (Fixed drug eruption, profound diarrheato Phesgo) Palliative radiation to the iliac bone and hip:05/20/2021-06/03/2021  HerceptinHylecta Injtoxicities:No major toxicities from Herceptin.  Possible rib fracture: We will assess this with the bone scan.  Bone Scan 08/01/21: Progression of bone mets with new lesions within cervical, thoracic and lumbar spine. New leisons in sternum, left rib and Rt Humerus.

## 2021-08-04 ENCOUNTER — Encounter: Payer: Self-pay | Admitting: General Practice

## 2021-08-04 ENCOUNTER — Encounter: Payer: Self-pay | Admitting: Hematology and Oncology

## 2021-08-04 ENCOUNTER — Encounter: Payer: Self-pay | Admitting: *Deleted

## 2021-08-04 ENCOUNTER — Other Ambulatory Visit: Payer: Self-pay

## 2021-08-04 ENCOUNTER — Inpatient Hospital Stay: Payer: 59

## 2021-08-04 ENCOUNTER — Inpatient Hospital Stay (HOSPITAL_BASED_OUTPATIENT_CLINIC_OR_DEPARTMENT_OTHER): Payer: 59 | Admitting: Hematology and Oncology

## 2021-08-04 VITALS — BP 115/79 | HR 107 | Temp 97.5°F | Resp 18 | Ht 65.0 in | Wt 187.2 lb

## 2021-08-04 DIAGNOSIS — C50919 Malignant neoplasm of unspecified site of unspecified female breast: Secondary | ICD-10-CM | POA: Diagnosis not present

## 2021-08-04 DIAGNOSIS — C50212 Malignant neoplasm of upper-inner quadrant of left female breast: Secondary | ICD-10-CM | POA: Diagnosis not present

## 2021-08-04 DIAGNOSIS — Z170 Estrogen receptor positive status [ER+]: Secondary | ICD-10-CM

## 2021-08-04 DIAGNOSIS — M84551G Pathological fracture in neoplastic disease, right femur, subsequent encounter for fracture with delayed healing: Secondary | ICD-10-CM | POA: Diagnosis not present

## 2021-08-04 DIAGNOSIS — Z5112 Encounter for antineoplastic immunotherapy: Secondary | ICD-10-CM | POA: Diagnosis not present

## 2021-08-04 DIAGNOSIS — C7951 Secondary malignant neoplasm of bone: Secondary | ICD-10-CM | POA: Diagnosis not present

## 2021-08-04 LAB — CBC WITH DIFFERENTIAL (CANCER CENTER ONLY)
Abs Immature Granulocytes: 0.03 10*3/uL (ref 0.00–0.07)
Basophils Absolute: 0.1 10*3/uL (ref 0.0–0.1)
Basophils Relative: 1 %
Eosinophils Absolute: 0.2 10*3/uL (ref 0.0–0.5)
Eosinophils Relative: 2 %
HCT: 36.4 % (ref 36.0–46.0)
Hemoglobin: 11.4 g/dL — ABNORMAL LOW (ref 12.0–15.0)
Immature Granulocytes: 0 %
Lymphocytes Relative: 17 %
Lymphs Abs: 1.7 10*3/uL (ref 0.7–4.0)
MCH: 27.5 pg (ref 26.0–34.0)
MCHC: 31.3 g/dL (ref 30.0–36.0)
MCV: 87.7 fL (ref 80.0–100.0)
Monocytes Absolute: 0.6 10*3/uL (ref 0.1–1.0)
Monocytes Relative: 6 %
Neutro Abs: 7.1 10*3/uL (ref 1.7–7.7)
Neutrophils Relative %: 74 %
Platelet Count: 371 10*3/uL (ref 150–400)
RBC: 4.15 MIL/uL (ref 3.87–5.11)
RDW: 15 % (ref 11.5–15.5)
WBC Count: 9.6 10*3/uL (ref 4.0–10.5)
nRBC: 0 % (ref 0.0–0.2)

## 2021-08-04 LAB — CMP (CANCER CENTER ONLY)
ALT: 18 U/L (ref 0–44)
AST: 16 U/L (ref 15–41)
Albumin: 4.3 g/dL (ref 3.5–5.0)
Alkaline Phosphatase: 117 U/L (ref 38–126)
Anion gap: 10 (ref 5–15)
BUN: 8 mg/dL (ref 6–20)
CO2: 24 mmol/L (ref 22–32)
Calcium: 9.7 mg/dL (ref 8.9–10.3)
Chloride: 108 mmol/L (ref 98–111)
Creatinine: 0.84 mg/dL (ref 0.44–1.00)
GFR, Estimated: >60 mL/min (ref >60)
Glucose, Bld: 104 mg/dL — ABNORMAL HIGH (ref 70–99)
Potassium: 4.5 mmol/L (ref 3.5–5.1)
Sodium: 142 mmol/L (ref 135–145)
Total Bilirubin: 0.3 mg/dL (ref 0.3–1.2)
Total Protein: 8.1 g/dL (ref 6.5–8.1)

## 2021-08-04 MED ORDER — HYDROMORPHONE HCL 2 MG PO TABS: 2.0000 mg | ORAL_TABLET | Freq: Two times a day (BID) | ORAL | 0 refills | Status: DC | PRN

## 2021-08-04 MED ORDER — TAMOXIFEN CITRATE 20 MG PO TABS: 20.0000 mg | ORAL_TABLET | Freq: Every day | ORAL | 3 refills | Status: DC

## 2021-08-04 MED ORDER — GABAPENTIN 300 MG PO CAPS
300.0000 mg | ORAL_CAPSULE | Freq: Every day | ORAL | 3 refills | Status: DC
  Filled 2021-12-19: qty 30, 30d supply, fill #0

## 2021-08-04 MED ORDER — ALPRAZOLAM 0.5 MG PO TABS
0.5000 mg | ORAL_TABLET | Freq: Three times a day (TID) | ORAL | 3 refills | Status: DC | PRN
  Filled 2021-12-19: qty 60, 20d supply, fill #0
  Filled 2022-01-11: qty 60, 20d supply, fill #1

## 2021-08-04 MED ORDER — PREDNISONE 50 MG PO TABS: ORAL_TABLET | ORAL | 0 refills | Status: DC

## 2021-08-04 NOTE — Progress Notes (Signed)
Pt requesting DNR status, Advance Directive information, and to connect with the [facility] for spiritual care.  Provider filled out Gold DNR form and given to pt.  RN provided pt with Advance Directive packet and reached out to our [facility] to contact pt.  Pt appreciative of the help.

## 2021-08-04 NOTE — Progress Notes (Signed)
[facility] Spiritual Care Note  Referred by Merleen Nicely DeSota/RN for emotional support. Left voicemail encouraging return call and also plan to follow up at treatment tomorrow.   [place], [place], [facility] Pager [phone] Voicemail [phone]

## 2021-08-05 ENCOUNTER — Other Ambulatory Visit (HOSPITAL_COMMUNITY): Payer: Self-pay

## 2021-08-05 ENCOUNTER — Other Ambulatory Visit: Payer: Self-pay | Admitting: *Deleted

## 2021-08-05 ENCOUNTER — Encounter: Payer: Self-pay | Admitting: Hematology and Oncology

## 2021-08-05 ENCOUNTER — Ambulatory Visit (HOSPITAL_COMMUNITY): Admission: RE | Admit: 2021-08-05 | Payer: 59 | Source: Ambulatory Visit

## 2021-08-05 MED ORDER — TAMOXIFEN CITRATE 20 MG PO TABS
20.0000 mg | ORAL_TABLET | Freq: Every day | ORAL | 3 refills | Status: DC
  Filled 2021-08-05: qty 90, 90d supply, fill #0
  Filled 2021-10-29: qty 90, 90d supply, fill #1
  Filled 2022-03-04: qty 90, 90d supply, fill #2
  Filled 2022-05-28: qty 90, 90d supply, fill #3

## 2021-08-08 ENCOUNTER — Other Ambulatory Visit: Payer: Self-pay

## 2021-08-08 ENCOUNTER — Ambulatory Visit (HOSPITAL_BASED_OUTPATIENT_CLINIC_OR_DEPARTMENT_OTHER)
Admission: RE | Admit: 2021-08-08 | Discharge: 2021-08-08 | Disposition: A | Discharge status: Home / Self Care | Payer: 59 | Source: Ambulatory Visit | Attending: Hematology and Oncology | Admitting: Hematology and Oncology

## 2021-08-08 DIAGNOSIS — C50212 Malignant neoplasm of upper-inner quadrant of left female breast: Secondary | ICD-10-CM | POA: Insufficient documentation

## 2021-08-08 DIAGNOSIS — K760 Fatty (change of) liver, not elsewhere classified: Secondary | ICD-10-CM | POA: Diagnosis not present

## 2021-08-08 DIAGNOSIS — Z170 Estrogen receptor positive status [ER+]: Secondary | ICD-10-CM | POA: Diagnosis not present

## 2021-08-08 DIAGNOSIS — C50919 Malignant neoplasm of unspecified site of unspecified female breast: Secondary | ICD-10-CM | POA: Diagnosis not present

## 2021-08-08 IMAGING — CT CT CHEST-ABD-PELV W/ CM
2 of 5 series · 14 of 46 positions shown, 16 images · IV contrast (APPLIED)
Comparison: CT chest, abdomen and pelvis dated [DATE]

CLINICAL DATA: Breast cancer staging

EXAM:
CT CHEST, ABDOMEN, AND PELVIS WITH CONTRAST
TECHNIQUE: Multidetector CT imaging of the chest, abdomen and pelvis was
performed following the standard protocol during bolus
administration of intravenous contrast.
CONTRAST:  80mL OMNIPAQUE IOHEXOL 350 MG/ML SOLN

[Series 2: cap with · axial · 0.81mm/px · z∈[-647,-97]mm · 11 of 132 slices shown, 13 images]
[im 11/132  soft-tissue]
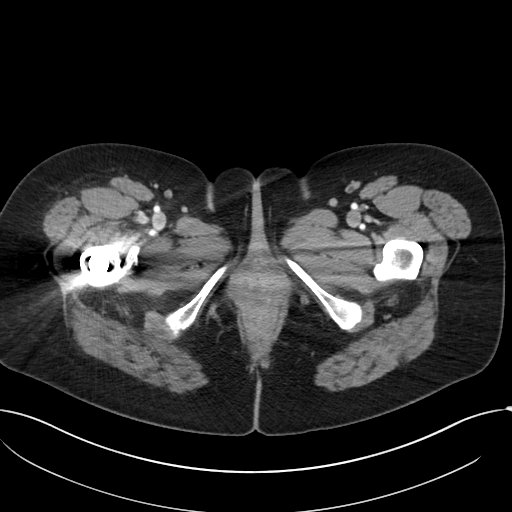
[im 11/132  bone]
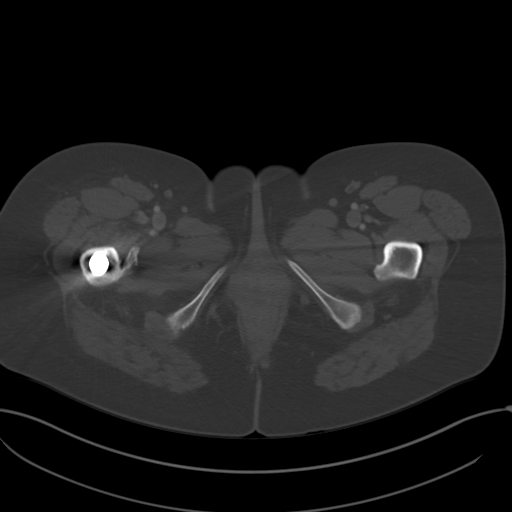
[im 22/132  soft-tissue]
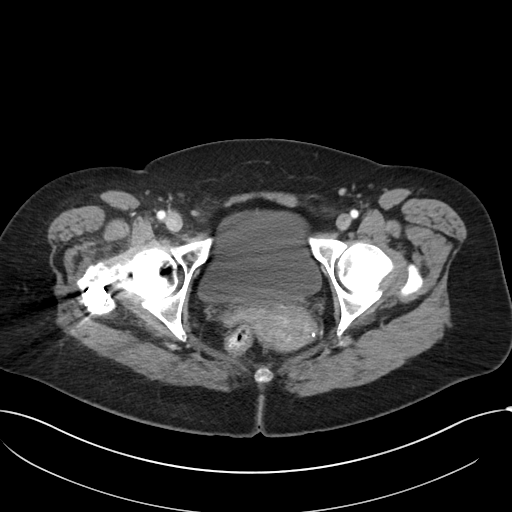
[im 33/132  soft-tissue]
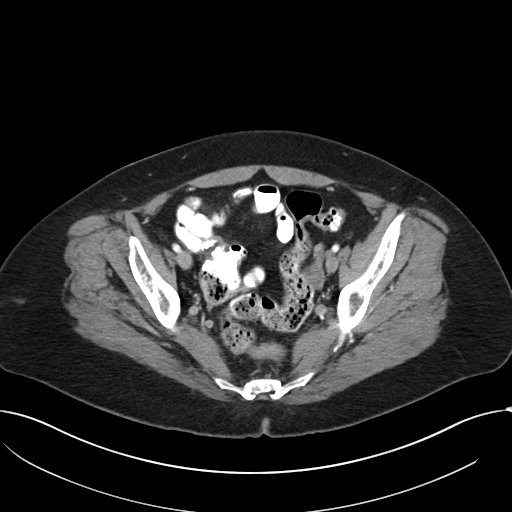
[im 44/132  soft-tissue]
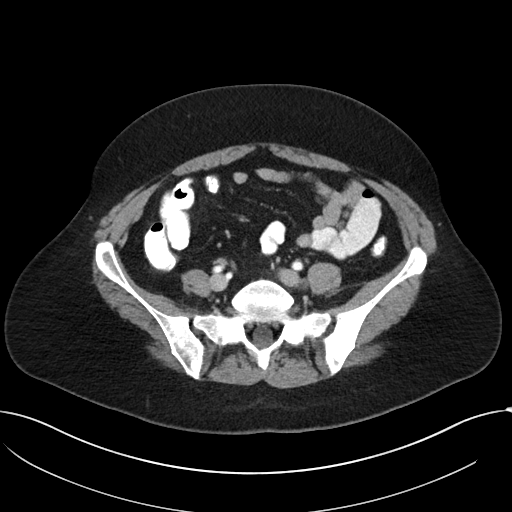
[im 55/132  soft-tissue]
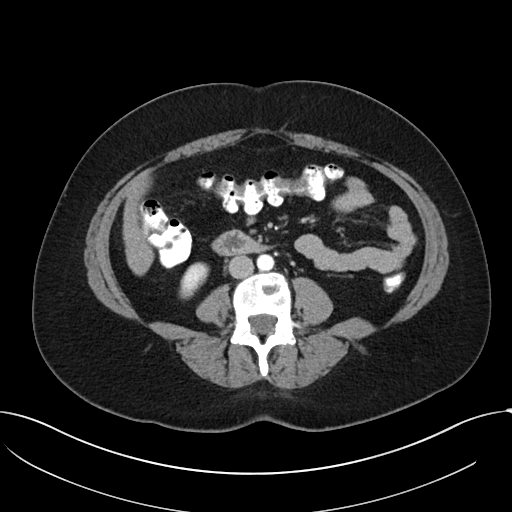
[im 66/132  soft-tissue]
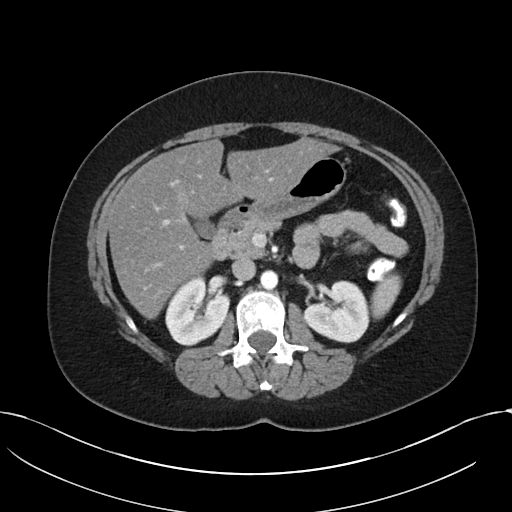
[im 77/132  soft-tissue]
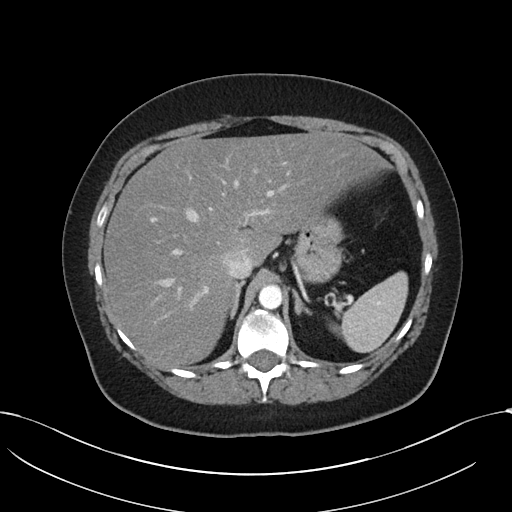
[im 88/132  soft-tissue]
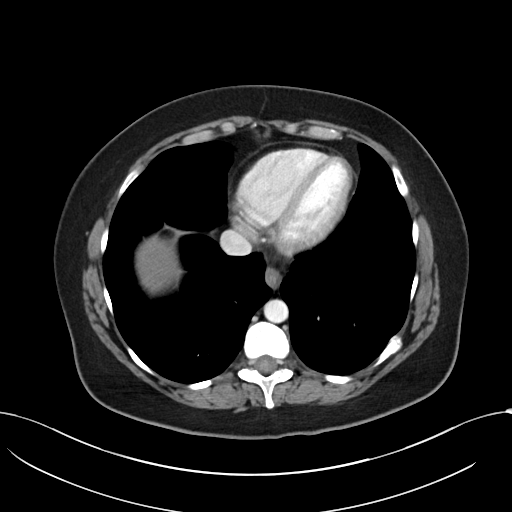
[im 99/132  soft-tissue]
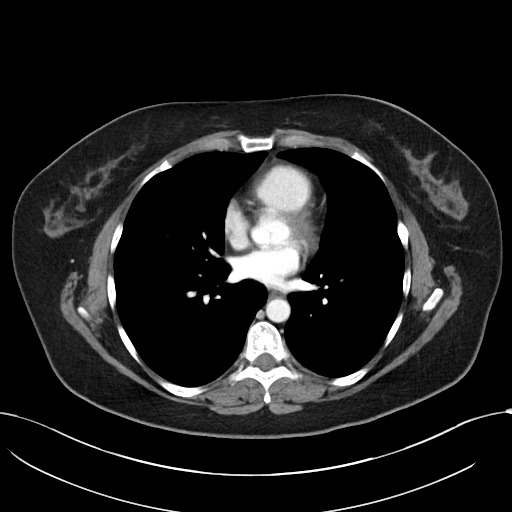
[im 99/132  bone]
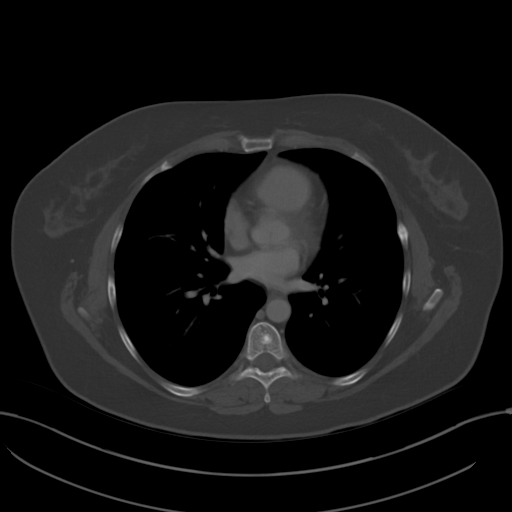
[im 110/132  soft-tissue]
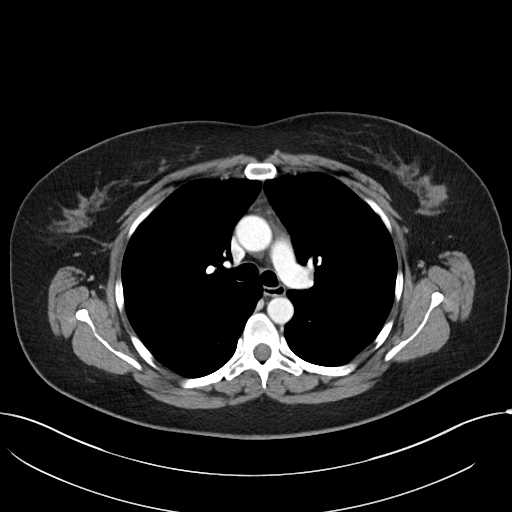
[im 121/132  soft-tissue]
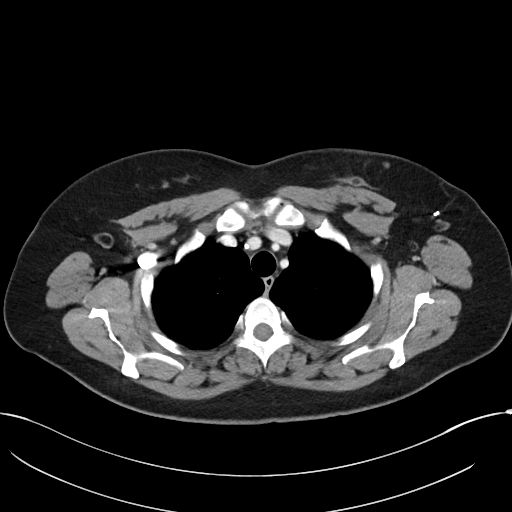

[Series 5: coronal · coronal · 0.88mm/px · 3 of 102 slices shown]
[im 34/102  soft-tissue]
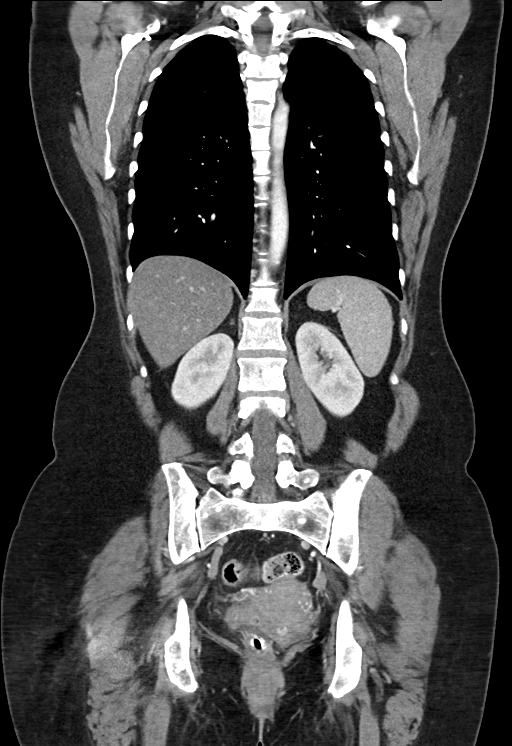
[im 45/102  soft-tissue]
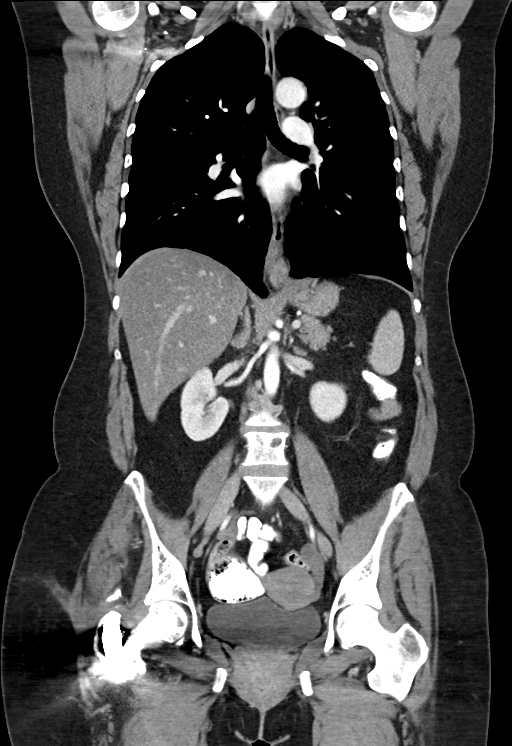
[im 57/102  soft-tissue]
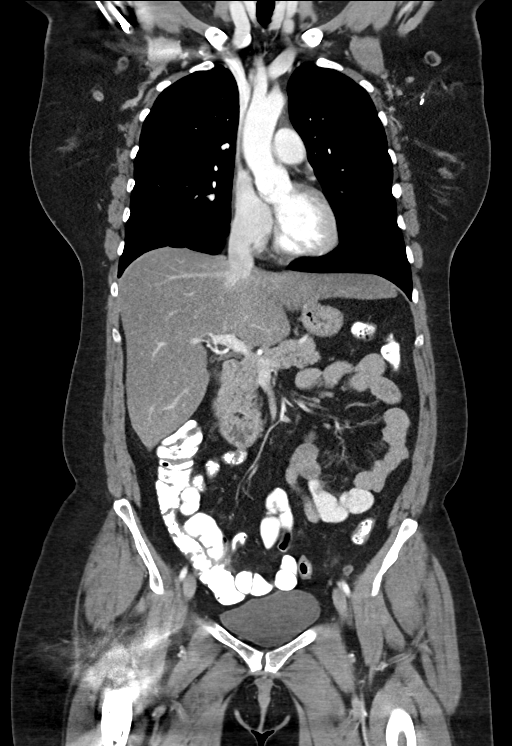

[14 of 46 positions shown; findings below may reference images not displayed]

FINDINGS: CT CHEST FINDINGS

Cardiovascular: Normal heart size. No pericardial effusion. No
significant coronary artery calcifications or atherosclerotic
disease of the thoracic aorta.

Mediastinum/Nodes: Small hiatal hernia. Thyroid is unremarkable. No
enlarged lymph nodes seen in the chest. Surgical clips of the left
axilla.

Lungs/Pleura: Central airways are patent. No consolidation, pleural
effusion or pneumothorax. Solid pulmonary nodule of the right middle
lobe measuring 4 mm, unchanged compared to prior exam. No new or
enlarging pulmonary nodules.

Musculoskeletal: Numerous sclerotic lesions are seen throughout the
thoracolumbar spine, sternum and bilateral ribs which are new
compared to prior exam

CT ABDOMEN PELVIS FINDINGS

Hepatobiliary: Hepatic steatosis. No suspicious focal liver lesions.
Gallbladder is unremarkable. No biliary ductal dilation.

Pancreas: Unremarkable. No pancreatic ductal dilatation or
surrounding inflammatory changes.

Spleen: Normal in size without focal abnormality.

Adrenals/Urinary Tract: Bilateral adrenal glands are unremarkable.
Tiny low-attenuation lesion of the anterior right kidney which is
likely simple cysts. Kidneys enhance symmetrically with no evidence
of hydronephrosis or nephrolithiasis. Bladder is unremarkable.

Stomach/Bowel: Stomach is within normal limits. Appendix is not
visualized. No evidence of bowel wall thickening, distention, or
inflammatory changes.

Vascular/Lymphatic: No significant vascular findings are present. No
enlarged abdominal or pelvic lymph nodes.

Reproductive: Uterus and bilateral adnexa are unremarkable.

Other: No abdominal wall hernia or abnormality. No abdominopelvic
ascites.

Musculoskeletal: Right femur status post intramedullary nail
placement. Large sclerotic lesion of the left iliac bone at site of
previously seen lytic lesion. Numerous sclerotic lesions seen
throughout the spine and pelvis which are new compared to prior
exam.
IMPRESSION: 1. No evidence of visceral or nodal metastatic disease in the chest,
abdomen or pelvis.
2. Numerous new sclerotic osseous lesions, possibly due to treatment
response.

## 2021-08-08 MED ORDER — IOHEXOL 350 MG/ML SOLN
100.0000 mL | Freq: Once | INTRAVENOUS | Status: AC | PRN
  Administered 2021-08-08: 80 mL via INTRAVENOUS

## 2021-08-12 ENCOUNTER — Other Ambulatory Visit: Payer: Self-pay

## 2021-08-12 ENCOUNTER — Other Ambulatory Visit: Payer: Self-pay | Admitting: *Deleted

## 2021-08-12 ENCOUNTER — Encounter: Payer: Self-pay | Admitting: General Practice

## 2021-08-12 ENCOUNTER — Inpatient Hospital Stay: Payer: 59

## 2021-08-12 VITALS — BP 119/75 | HR 80 | Temp 98.7°F | Resp 18 | Wt 193.0 lb

## 2021-08-12 DIAGNOSIS — Z5112 Encounter for antineoplastic immunotherapy: Secondary | ICD-10-CM | POA: Diagnosis not present

## 2021-08-12 DIAGNOSIS — C50212 Malignant neoplasm of upper-inner quadrant of left female breast: Secondary | ICD-10-CM | POA: Diagnosis not present

## 2021-08-12 DIAGNOSIS — Z170 Estrogen receptor positive status [ER+]: Secondary | ICD-10-CM

## 2021-08-12 DIAGNOSIS — C7951 Secondary malignant neoplasm of bone: Secondary | ICD-10-CM | POA: Diagnosis not present

## 2021-08-12 MED ORDER — TRASTUZUMAB-HYALURONIDASE-OYSK 600-10000 MG-UNT/5ML ~~LOC~~ SOLN
600.0000 mg | Freq: Once | SUBCUTANEOUS | Status: AC
  Administered 2021-08-12: 600 mg via SUBCUTANEOUS
  Filled 2021-08-12: qty 5

## 2021-08-12 NOTE — Patient Instructions (Signed)
[facility]  Discharge Instructions: Thank you for choosing [facility] to provide your oncology and hematology care.   If you have a lab appointment with the [facility], please go directly to the [facility] and check in at the registration area.   Wear comfortable clothing and clothing appropriate for easy access to any Portacath or PICC line.   We strive to give you quality time with your provider. You may need to reschedule your appointment if you arrive late (15 or more minutes).  Arriving late affects you and other patients whose appointments are after yours.  Also, if you miss three or more appointments without notifying the office, you may be dismissed from the clinic at the provider's discretion.      For prescription refill requests, have your pharmacy contact our office and allow 72 hours for refills to be completed.    Today you received the following chemotherapy and/or immunotherapy agents: Herceptin Hylecta       To help prevent nausea and vomiting after your treatment, we encourage you to take your nausea medication as directed.  BELOW ARE SYMPTOMS THAT SHOULD BE REPORTED IMMEDIATELY: *FEVER GREATER THAN 100.4 F (38 C) OR HIGHER *CHILLS OR SWEATING *NAUSEA AND VOMITING THAT IS NOT CONTROLLED WITH YOUR NAUSEA MEDICATION *UNUSUAL SHORTNESS OF BREATH *UNUSUAL BRUISING OR BLEEDING *URINARY PROBLEMS (pain or burning when urinating, or frequent urination) *BOWEL PROBLEMS (unusual diarrhea, constipation, pain near the anus) TENDERNESS IN MOUTH AND THROAT WITH OR WITHOUT PRESENCE OF ULCERS (sore throat, sores in mouth, or a toothache) UNUSUAL RASH, SWELLING OR PAIN  UNUSUAL VAGINAL DISCHARGE OR ITCHING   Items with * indicate a potential emergency and should be followed up as soon as possible or go to the Emergency Department if any problems should occur.  Please show the CHEMOTHERAPY ALERT CARD or IMMUNOTHERAPY ALERT CARD at  check-in to the Emergency Department and triage nurse.  Should you have questions after your visit or need to cancel or reschedule your appointment, please contact [facility]  Dept: [phone]  and follow the prompts.  Office hours are 8:00 a.m. to 4:30 p.m. Monday - Friday. Please note that voicemails left after 4:00 p.m. may not be returned until the following business day.  We are closed weekends and major holidays. You have access to a nurse at all times for urgent questions. Please call the main number to the clinic Dept: [phone] and follow the prompts.   For any non-urgent questions, you may also contact your provider using MyChart. We now offer e-Visits for anyone 41 and older to request care online for non-urgent symptoms. For details visit [email or web address].   Also download the MyChart app! Go to the app store, search "MyChart", open the app, select [facility], and log in with your MyChart username and password.  Due to Covid, a mask is required upon entering the hospital/clinic. If you do not have a mask, one will be given to you upon arrival. For doctor visits, patients may have 1 support person aged 38 or older with them. For treatment visits, patients cannot have anyone with them due to current Covid guidelines and our immunocompromised population.   Trastuzumab; Hyaluronidase injection What is this medication? TRASTUZUMAB; HYALURONIDASE (tras TOO zoo mab / hye al ur ON i dase) is used to treat breast cancer and stomach cancer. Trastuzumab is a monoclonal antibody. Hyaluronidase is used to improve the effects of trastuzumab. This medicine may be  used for other purposes; ask your health care provider or pharmacist if you have questions. COMMON BRAND NAME(S): HERCEPTIN HYLECTA What should I tell my care team before I take this medication? They need to know if you have any of these conditions: heart disease heart failure lung or breathing  disease, like asthma an unusual or allergic reaction to trastuzumab, or other medications, foods, dyes, or preservatives pregnant or trying to get pregnant breast-feeding How should I use this medication? This medicine is for injection under the skin. It is given by a health care professional in a hospital or clinic setting. Talk to your pediatrician regarding the use of this medicine in children. This medicine is not approved for use in children. Overdosage: If you think you have taken too much of this medicine contact a poison control center or emergency room at once. NOTE: This medicine is only for you. Do not share this medicine with others. What if I miss a dose? It is important not to miss a dose. Call your doctor or health care professional if you are unable to keep an appointment. What may interact with this medication? This medicine may interact with the following medications: certain types of chemotherapy, such as daunorubicin, doxorubicin, epirubicin, and idarubicin This list may not describe all possible interactions. Give your health care provider a list of all the medicines, herbs, non-prescription drugs, or dietary supplements you use. Also tell them if you smoke, drink alcohol, or use illegal drugs. Some items may interact with your medicine. What should I watch for while using this medication? Visit your doctor for checks on your progress. Report any side effects. Continue your course of treatment even though you feel ill unless your doctor tells you to stop. Call your doctor or health care professional for advice if you get a fever, chills or sore throat, or other symptoms of a cold or flu. Do not treat yourself. Try to avoid being around people who are sick. You may experience fever, chills and shaking during your first infusion. These effects are usually mild and can be treated with other medicines. Report any side effects during the infusion to your health care professional.  Fever and chills usually do not happen with later infusions. Do not become pregnant while taking this medicine or for 7 months after stopping it. Women should inform their doctor if they wish to become pregnant or think they might be pregnant. Women of child-bearing potential will need to have a negative pregnancy test before starting this medicine. There is a potential for serious side effects to an unborn child. Talk to your health care professional or pharmacist for more information. Do not breast-feed an infant while taking this medicine or for 7 months after stopping it. What side effects may I notice from receiving this medication? Side effects that you should report to your doctor or health care professional as soon as possible: allergic reactions like skin rash, itching or hives, swelling of the face, lips, or tongue breathing problems chest pain or palpitations cough fever general ill feeling or flu-like symptoms signs of worsening heart failure like breathing problems; swelling in your legs and feet Side effects that usually do not require medical attention (report these to your doctor or health care professional if they continue or are bothersome): bone pain changes in taste diarrhea joint pain nausea/vomiting unusually weak or tired weight loss This list may not describe all possible side effects. Call your doctor for medical advice about side effects. You may report  side effects to FDA at 1-800-FDA-1088. Where should I keep my medication? This drug is given in a hospital or clinic and will not be stored at home. NOTE: This sheet is a summary. It may not cover all possible information. If you have questions about this medicine, talk to your doctor, pharmacist, or health care provider.  2022 [employer]vier/Gold Standard (2018-01-24 00:00:00)

## 2021-08-12 NOTE — Progress Notes (Signed)
[facility] Note  Reconnected with Ramatoulaye in infusion per her request via RN. We scheduled an appointment in my office at 2pm on Tuesday, November 29 to discuss her questions regarding Advance Directives and EOL planning so that she can be proactive with arrangements; she wants to address her needs in advance in order to avoid burdening family with additional stressors.   [place], [place], [facility] Pager [phone] Voicemail [phone]

## 2021-08-13 ENCOUNTER — Telehealth: Payer: Self-pay | Admitting: *Deleted

## 2021-08-13 NOTE — Telephone Encounter (Signed)
This RN received call from the pt's insurance case manager as well as the patient requesting an appt with Dr Jana Hakim " for a second opinion".  This RN explained that MD is retiring and wanted to verify if she would like 2nd opinion for another MD- Natalie Griffin stated " No - I do not want to switch to Dr Jana Hakim but just want to hear his review and input "  Note pt is currently under therapy.  Above reviewed with Dr Jana Hakim and appt time given per above.  This RN called and informed the patient.

## 2021-08-19 ENCOUNTER — Inpatient Hospital Stay: Payer: 59 | Admitting: General Practice

## 2021-08-21 DIAGNOSIS — D649 Anemia, unspecified: Secondary | ICD-10-CM | POA: Diagnosis not present

## 2021-08-21 DIAGNOSIS — C7951 Secondary malignant neoplasm of bone: Secondary | ICD-10-CM | POA: Diagnosis not present

## 2021-08-21 DIAGNOSIS — M84453D Pathological fracture, unspecified femur, subsequent encounter for fracture with routine healing: Secondary | ICD-10-CM | POA: Diagnosis not present

## 2021-08-21 DIAGNOSIS — F988 Other specified behavioral and emotional disorders with onset usually occurring in childhood and adolescence: Secondary | ICD-10-CM | POA: Diagnosis not present

## 2021-08-21 DIAGNOSIS — G8929 Other chronic pain: Secondary | ICD-10-CM | POA: Diagnosis not present

## 2021-08-21 DIAGNOSIS — Z7189 Other specified counseling: Secondary | ICD-10-CM | POA: Diagnosis not present

## 2021-08-21 DIAGNOSIS — C50919 Malignant neoplasm of unspecified site of unspecified female breast: Secondary | ICD-10-CM | POA: Diagnosis not present

## 2021-08-27 ENCOUNTER — Inpatient Hospital Stay: Payer: 59 | Attending: Hematology and Oncology | Admitting: Oncology

## 2021-08-27 ENCOUNTER — Other Ambulatory Visit: Payer: Self-pay

## 2021-08-27 VITALS — BP 114/63 | HR 63 | Temp 99.0°F | Resp 16

## 2021-08-27 DIAGNOSIS — Z170 Estrogen receptor positive status [ER+]: Secondary | ICD-10-CM | POA: Diagnosis not present

## 2021-08-27 DIAGNOSIS — C50212 Malignant neoplasm of upper-inner quadrant of left female breast: Secondary | ICD-10-CM

## 2021-08-27 DIAGNOSIS — C7951 Secondary malignant neoplasm of bone: Secondary | ICD-10-CM | POA: Insufficient documentation

## 2021-08-27 DIAGNOSIS — Z5112 Encounter for antineoplastic immunotherapy: Secondary | ICD-10-CM | POA: Insufficient documentation

## 2021-08-27 NOTE — Progress Notes (Signed)
[facility]  [phone] Fax:([phone]     ID: Natalie Griffin DOB: 03-Jun-1976  MR#: [id number]  XUX#:[id number]  Patient Care Team: Pcp, No as PCP - General Natalie Germany, RN as Oncology Nurse Navigator Natalie Kaufmann, RN as Oncology Nurse Navigator Natalie Cruel, MD OTHER MD:  CHIEF COMPLAINT: Metastatic breast cancer, HER2 positive  CURRENT TREATMENT: Trastuzumab   HISTORY OF CURRENT ILLNESS: The patient herself palpated and lump in her left breast breast.  She brought it to medical attention and on 03/08/2020 underwent mammography and bilateral breast ultrasonography at the [facility] showing breast density category C.  In the upper inner right breast there was some dense tissue associated with the palpable mass.  Ultrasound showed a 3.0 cm irregular hypoechoic mass in the left upper inner quadrant.  There was no left axillary adenopathy.  The right breast and axilla were unremarkable.  Biopsy of the left breast mass in question was 04/11/2020 showed ([id number]) and invasive ductal carcinoma, grade 2, estrogen and progesterone receptor positive, with an MIB-1 of 30%, and HER2 positive at 3+.  On 06/04/2020 the patient underwent left lumpectomy and sentinel lymph node sampling showing ([id number]) a 3.2 cm invasive ductal carcinoma, grade 2, with negative margins.  The single sentinel lymph node was benign.  The patient refused adjuvant chemotherapy, radiation or antiestrogen therapy.  More recently the patient developed some right hip pain.  MRI of the right hip obtained at [facility] 03/14/2021 showed a large marrow lesion involving the right femoral neck and intertrochanteric region.  There was also an ill-defined lesion in the left posterior iliac bone and a smaller lesion in the sacrum, right acetabulum and right ischial tuberosity.  There was no evidence of fracture.  Staging studies have included a CT scan  of the chest abdomen and pelvis 03/18/2021 showing no evidence of visceral metastases.  There was incidental hepatic steatosis and nonobstructing renal calculus.  The scans confirmed the cortical destruction of the right femoral neck, as well as lesions in the left iliac bone and L3 vertebral body.  Bone scan 03/26/2021 confirmed the left iliac bone lesion, as well as finding increased uptake at the cervicothoracic junction (L3) with other small lesions in the pelvic MRI not having definite uptake.  MRI angio and noncontrast brain MRI 03/26/2021 showed no evidence of metastases in that area.  On 04/01/2021 the patient underwent intramedullary right femoral nailing for impending right hip fracture under [facility]. Unfortunately reamings from the right femoral procedure confirmed metastatic carcinoma consistent with breast primary ([id number]).  Prognostic indicators from this sample shows the cancer to be still HER2 positive at 3+, estrogen receptor 2% positive with moderate to weak intensity and progesterone receptor negative  The patient was started on tamoxifen 04/08/2021 but this was discontinued by the patient because of concerns regarding risks versus benefits.  She was also started on subcutaneous Herceptin and Perjeta.  She developed a rash and severe diarrhea with the Perjeta injections.  She is tolerating the Herceptin well.  She underwent palliative radiation to the right iliac bone and hip 05/20/2021 through 06/03/2021.  The patient's subsequent history is as detailed below.  INTERVAL HISTORY: Natalie Griffin was evaluated in the breast cancer clinic on 08/27/2021 .   REVIEW OF SYSTEMS: Natalie Griffin is recovering nicely from her recent surgery.  She is getting back to work.  She walks for exercise and she is having no difficulty with that.  She is having  some diarrhea issues related to the Herceptin.  She tells me when she has a period she feels a little bit iron deficient for a few days and she takes  some oral iron which helps.  Detailed review of systems today was otherwise noncontributory  COPVID: The patient had COVID (not test proven) November 2021.  She refuses vaccination and has obtained an exception for religious reasons so she can continue to work at [facility] without being vaccinated  PAST MEDICAL HISTORY: Past Medical History:  Diagnosis Date   Anemia    Bone metastasis ([facility])    Breast cancer ([facility]) 2021   Left breast invasive ductal carcinoma   Cancer ([facility]) 2021   Left breast   Family history of adverse reaction to anesthesia    Father and Aunt have pseudocholinesterase deficiency - Trouble waking up from anesthesia   History of hiatal hernia    History of kidney stones    noted on CT Left nonobstructive    PAST SURGICAL HISTORY: Past Surgical History:  Procedure Laterality Date   BREAST LUMPECTOMY WITH RADIOACTIVE SEED AND SENTINEL LYMPH NODE BIOPSY Left 06/04/2020   Procedure: LEFT BREAST LUMPECTOMY WITH RADIOACTIVE SEED AND SENTINEL LYMPH NODE MAPPING;  Surgeon: Natalie Luna, MD;  Location: [facility];  Service: General;  Laterality: Left;  PEC BLOCK, RNFA   FEMUR IM NAIL Right 04/01/2021   Procedure: INTRAMEDULLARY (IM) NAIL FEMORAL;  Surgeon: Natalie Butters, MD;  Location: [facility];  Service: Orthopedics;  Laterality: Right;   WISDOM TOOTH EXTRACTION      FAMILY HISTORY The patient's father died at age 46 from complications of diabetes and renal disease.  The patient's mother is 46.  The patient has no brothers.  She has 3 half-sisters.  There is no history of breast cancer in the family.  One paternal aunt had lung cancer associated with tobacco abuse  GYNECOLOGIC HISTORY:  No LMP recorded. Menarche: 45 years old Age at first live birth: 45 years old [place] P 1 LMP October 2022 Contraceptive barrier method HRT n/a  Hysterectomy?  No Salpingo-oophorectomy?  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  She describes herself as single  and lives by herself with no pets (though she is planning to adopt a dog January 2023).  Her son Natalie Griffin lives in [place] where he lays carpet and tile.  He is 46 years old as of December 2022.  The patient has no grandchildren.    ADVANCED DIRECTIVES: Not in place but the patient intends to name her sister Legrand Rams as healthcare power of attorney   HEALTH MAINTENANCE: Social History   Tobacco Use   Smoking status: Former    Types: Cigarettes    Quit date: 05/19/2020    Years since quitting: 1.2   Smokeless tobacco: Never  Vaping Use   Vaping Use: Never used  Substance Use Topics   Alcohol use: Yes    Comment: social drinker   Drug use: Never     Colonoscopy:  PAP:  Bone density:   Allergies  Allergen Reactions   Adhesive [Tape] Other (See Comments)    Blistering with steri strips   Phesgo [Pertuz-Trastuz-Hyaluron-Zzxf] Diarrhea and Rash    Phesgo side effect: Fixed drug eruption, profound diarrhea Therefore we will discontinue Phesgo and start her on Herceptin Hylecta Inj   Tetanus Toxoid Anaphylaxis   Succinylcholine Other (See Comments)    Her Body does not have enough enzymes to carry  it out of her body   Contrast Media [Iodinated Diagnostic Agents] Rash    Rash/hives    Current Outpatient Medications  Medication Sig Dispense Refill   ALPRAZolam (XANAX) 0.5 MG tablet Take 1 tablet (0.5 mg total) by mouth 3 (three) times daily as needed for anxiety. 60 tablet 3   calcium carbonate (OS-CAL) 600 MG TABS tablet Take 600 mg by mouth daily with breakfast.     gabapentin (NEURONTIN) 300 MG capsule Take 1 capsule (300 mg total) by mouth at bedtime. 30 capsule 3   HYDROmorphone (DILAUDID) 2 MG tablet Take 1 tablet (2 mg total) by mouth every 12 (twelve) hours as needed for severe pain. 60 tablet 0   Multiple Vitamins-Minerals (MULTIVITAMIN WITH MINERALS) tablet Take 1 tablet by mouth daily.     predniSONE (DELTASONE) 50 MG tablet Before CT scan for contrast  allergy at 11 hours, 7 hours and 1 hour before scan 3 tablet 0   tamoxifen (NOLVADEX) 20 MG tablet Take 1 tablet (20 mg total) by mouth daily. 90 tablet 3   Vitamin D, Cholecalciferol, 25 MCG (1000 UT) TABS Take 1 tablet by mouth daily. 60 tablet    No current facility-administered medications for this visit.    OBJECTIVE: White woman in no acute distress  Vitals:   08/27/21 1626  BP: 114/63  Pulse: 63  Resp: 16  Temp: 99 F (37.2 C)  SpO2: 97%     There is no height or weight on file to calculate BMI.   Wt Readings from Last 3 Encounters:  08/12/21 193 lb (87.5 kg)  08/04/21 187 lb 3.2 oz (84.9 kg)  07/22/21 189 lb 9.6 oz (86 kg)      ECOG FS:1 - Symptomatic but completely ambulatory  Ocular: Sclerae unicteric, pupils round and equal Ear-nose-throat: Oropharynx clear and moist Lymphatic: No cervical or supraclavicular adenopathy Lungs no rales or rhonchi Heart regular rate and rhythm Abd soft, nontender, positive bowel sounds MSK no focal spinal tenderness, no joint edema Neuro: non-focal, well-oriented, appropriate affect Breasts: The right breast is unremarkable.  The left breast is status postlumpectomy.  The cosmetic result is good.  There is no evidence of local recurrence.  Both axillae are benign.   LAB RESULTS:  CMP     Component Value Date/Time   NA 142 08/04/2021 1251   K 4.5 08/04/2021 1251   CL 108 08/04/2021 1251   CO2 24 08/04/2021 1251   GLUCOSE 104 (H) 08/04/2021 1251   BUN 8 08/04/2021 1251   CREATININE 0.84 08/04/2021 1251   CALCIUM 9.7 08/04/2021 1251   PROT 8.1 08/04/2021 1251   ALBUMIN 4.3 08/04/2021 1251   AST 16 08/04/2021 1251   ALT 18 08/04/2021 1251   ALKPHOS 117 08/04/2021 1251   BILITOT 0.3 08/04/2021 1251   GFRNONAA >60 08/04/2021 1251   GFRAA >60 05/29/2020 0926    No results found for: TOTALPROTELP, ALBUMINELP, A1GS, A2GS, BETS, BETA2SER, GAMS, MSPIKE, SPEI  No results found for: Nils Pyle, [facility]  Lab  Results  Component Value Date   WBC 9.6 08/04/2021   NEUTROABS 7.1 08/04/2021   HGB 11.4 (L) 08/04/2021   HCT 36.4 08/04/2021   MCV 87.7 08/04/2021   PLT 371 08/04/2021      Chemistry      Component Value Date/Time   NA 142 08/04/2021 1251   K 4.5 08/04/2021 1251   CL 108 08/04/2021 1251   CO2 24 08/04/2021 1251   BUN 8 08/04/2021 1251  CREATININE 0.84 08/04/2021 1251      Component Value Date/Time   CALCIUM 9.7 08/04/2021 1251   ALKPHOS 117 08/04/2021 1251   AST 16 08/04/2021 1251   ALT 18 08/04/2021 1251   BILITOT 0.3 08/04/2021 1251       No results found for: LABCA2  No components found for: TDSKAJ681  No results for input(s): INR in the last 168 hours.  No results found for: LABCA2  No results found for: LXB262  No results found for: MBT597  No results found for: CBU384  No results found for: CA2729  No components found for: HGQUANT  No results found for: CEA1 / No results found for: CEA1   No results found for: AFPTUMOR  No results found for: CHROMOGRNA  No results found for: PSA1  No visits with results within 3 Day(s) from this visit.  Latest known visit with results is:  Appointment on 08/04/2021  Component Date Value Ref Range Status   Sodium 08/04/2021 142  135 - 145 mmol/L Final   Potassium 08/04/2021 4.5  3.5 - 5.1 mmol/L Final   Chloride 08/04/2021 108  98 - 111 mmol/L Final   CO2 08/04/2021 24  22 - 32 mmol/L Final   Glucose, Bld 08/04/2021 104 (H)  70 - 99 mg/dL Final   Glucose reference range applies only to samples taken after fasting for at least 8 hours.   BUN 08/04/2021 8  6 - 20 mg/dL Final   Creatinine 08/04/2021 0.84  0.44 - 1.00 mg/dL Final   Calcium 08/04/2021 9.7  8.9 - 10.3 mg/dL Final   Total Protein 08/04/2021 8.1  6.5 - 8.1 g/dL Final   Albumin 08/04/2021 4.3  3.5 - 5.0 g/dL Final   AST 08/04/2021 16  15 - 41 U/L Final   ALT 08/04/2021 18  0 - 44 U/L Final   Alkaline Phosphatase 08/04/2021 117  38 - 126 U/L Final    Total Bilirubin 08/04/2021 0.3  0.3 - 1.2 mg/dL Final   GFR, Estimated 08/04/2021 >60  >60 mL/min Final   Comment: (NOTE) Calculated using the CKD-EPI Creatinine Equation (2021)    Anion gap 08/04/2021 10  5 - 15 Final   Performed at [facility], [place] [address] [place], [place] [address]   WBC Count 08/04/2021 9.6  4.0 - 10.5 K/uL Final   RBC 08/04/2021 4.15  3.87 - 5.11 MIL/uL Final   Hemoglobin 08/04/2021 11.4 (L)  12.0 - 15.0 g/dL Final   HCT 08/04/2021 36.4  36.0 - 46.0 % Final   MCV 08/04/2021 87.7  80.0 - 100.0 fL Final   MCH 08/04/2021 27.5  26.0 - 34.0 pg Final   MCHC 08/04/2021 31.3  30.0 - 36.0 g/dL Final   RDW 08/04/2021 15.0  11.5 - 15.5 % Final   Platelet Count 08/04/2021 371  150 - 400 K/uL Final   nRBC 08/04/2021 0.0  0.0 - 0.2 % Final   Neutrophils Relative % 08/04/2021 74  % Final   Neutro Abs 08/04/2021 7.1  1.7 - 7.7 K/uL Final   Lymphocytes Relative 08/04/2021 17  % Final   Lymphs Abs 08/04/2021 1.7  0.7 - 4.0 K/uL Final   Monocytes Relative 08/04/2021 6  % Final   Monocytes Absolute 08/04/2021 0.6  0.1 - 1.0 K/uL Final   Eosinophils Relative 08/04/2021 2  % Final   Eosinophils Absolute 08/04/2021 0.2  0.0 - 0.5 K/uL Final   Basophils Relative 08/04/2021 1  % Final  Basophils Absolute 08/04/2021 0.1  0.0 - 0.1 K/uL Final   Immature Granulocytes 08/04/2021 0  % Final   Abs Immature Granulocytes 08/04/2021 0.03  0.00 - 0.07 K/uL Final   Performed at [facility], [place] [address] [place], [place] [address]    (this displays the last labs from the last 3 days)  No results found for: TOTALPROTELP, ALBUMINELP, A1GS, A2GS, BETS, BETA2SER, GAMS, MSPIKE, SPEI (this displays SPEP labs)  No results found for: KPAFRELGTCHN, LAMBDASER, KAPLAMBRATIO (kappa/lambda light chains)  No results found for: HGBA, HGBA2QUANT, HGBFQUANT, HGBSQUAN (Hemoglobinopathy evaluation)   No results found for: LDH  Lab Results   Component Value Date   IRON 76 06/03/2021   TIBC 357 06/03/2021   IRONPCTSAT 21 06/03/2021   (Iron and TIBC)  Lab Results  Component Value Date   FERRITIN 25 06/03/2021    Urinalysis No results found for: COLORURINE, APPEARANCEUR, LABSPEC, PHURINE, GLUCOSEU, HGBUR, BILIRUBINUR, KETONESUR, PROTEINUR, UROBILINOGEN, NITRITE, LEUKOCYTESUR   STUDIES: NM Bone Scan Whole Body  Result Date: 08/01/2021 CLINICAL DATA:  Breast cancer, assess treatment response EXAM: NUCLEAR MEDICINE WHOLE BODY BONE SCAN TECHNIQUE: Whole body anterior and posterior images were obtained approximately 3 hours after intravenous injection of radiopharmaceutical. RADIOPHARMACEUTICALS:  20.9 mCi Technetium-10mMDP IV COMPARISON:  Bone scan 03/26/2021 FINDINGS: New lesion in the sternum at the sternomanubrial junction. New lesion in the anterior medial LEFT lower rib. Probable new lesion in the RIGHT humeral head. On posterior projection, there are multiple new lesions within the cervicothoracic and lumbar spine. Similar activity in the RIGHT iliac bone centrally. Increased radiotracer accumulation in the proximal RIGHT femur related to internal fixation of the RIGHT hip. IMPRESSION: 1. Progression of skeletal metastasis with multiple new lesions within the cervical, thoracic and lumbar spine. Additional new lesions in the sternum, LEFT rib, RIGHT humerus. 2. Increased uptake in the RIGHT femur related to intramedullary nail fixation Electronically Signed   By: SSuzy BouchardM.D.   On: 08/01/2021 08:11   CT CHEST ABDOMEN PELVIS W CONTRAST  Result Date: 08/08/2021 CLINICAL DATA:  Breast cancer staging EXAM: CT CHEST, ABDOMEN, AND PELVIS WITH CONTRAST TECHNIQUE: Multidetector CT imaging of the chest, abdomen and pelvis was performed following the standard protocol during bolus administration of intravenous contrast. CONTRAST:  89mOMNIPAQUE IOHEXOL 350 MG/ML SOLN COMPARISON:  CT chest, abdomen and pelvis dated March 18, 2021 FINDINGS: CT CHEST FINDINGS Cardiovascular: Normal heart size. No pericardial effusion. No significant coronary artery calcifications or atherosclerotic disease of the thoracic aorta. Mediastinum/Nodes: Small hiatal hernia. Thyroid is unremarkable. No enlarged lymph nodes seen in the chest. Surgical clips of the left axilla. Lungs/Pleura: Central airways are patent. No consolidation, pleural effusion or pneumothorax. Solid pulmonary nodule of the right middle lobe measuring 4 mm, unchanged compared to prior exam. No new or enlarging pulmonary nodules. Musculoskeletal: Numerous sclerotic lesions are seen throughout the thoracolumbar spine, sternum and bilateral ribs which are new compared to prior exam CT ABDOMEN PELVIS FINDINGS Hepatobiliary: Hepatic steatosis. No suspicious focal liver lesions. Gallbladder is unremarkable. No biliary ductal dilation. Pancreas: Unremarkable. No pancreatic ductal dilatation or surrounding inflammatory changes. Spleen: Normal in size without focal abnormality. Adrenals/Urinary Tract: Bilateral adrenal glands are unremarkable. Tiny low-attenuation lesion of the anterior right kidney which is likely simple cysts. Kidneys enhance symmetrically with no evidence of hydronephrosis or nephrolithiasis. Bladder is unremarkable. Stomach/Bowel: Stomach is within normal limits. Appendix is not visualized. No evidence of bowel wall thickening, distention, or inflammatory changes. Vascular/Lymphatic: No significant  vascular findings are present. No enlarged abdominal or pelvic lymph nodes. Reproductive: Uterus and bilateral adnexa are unremarkable. Other: No abdominal wall hernia or abnormality. No abdominopelvic ascites. Musculoskeletal: Right femur status post intramedullary nail placement. Large sclerotic lesion of the left iliac bone at site of previously seen lytic lesion. Numerous sclerotic lesions seen throughout the spine and pelvis which are new compared to prior exam. IMPRESSION: 1.  No evidence of visceral or nodal metastatic disease in the chest, abdomen or pelvis. 2. Numerous new sclerotic osseous lesions, possibly due to treatment response. Electronically Signed   By: Yetta Glassman M.D.   On: 08/08/2021 14:34   ECHOCARDIOGRAM COMPLETE  Result Date: 07/31/2021    ECHOCARDIOGRAM REPORT   Patient Name:   AYLINN RYDBERG Kiesel Date of Exam: 07/31/2021 Medical Rec #:  [id number]         Height:       65.0 in Accession #:    [id number]        Weight:       189.6 lb Date of Birth:  May 26, 1976         BSA:          1.934 m Patient Age:    82 years          BP:           107/68 mmHg Patient Gender: F                 HR:           54 bpm. Exam Location:  Outpatient Procedure: 2D Echo, Cardiac Doppler, Color Doppler and Strain Analysis Indications:    Chemo evaluation  History:        Patient has prior history of Echocardiogram examinations, most                 recent 05/08/2021. Breast cancer, chemo.  Sonographer:    Dustin Flock RDCS Referring Phys: [phone] [place]  1. Left ventricular ejection fraction, by estimation, is 55 to 60%. The left ventricle has normal function. The left ventricle has no regional wall motion abnormalities. Left ventricular diastolic parameters were normal. The average left ventricular global longitudinal strain is -19.2 %. The global longitudinal strain is normal.  2. Right ventricular systolic function is mildly reduced. The right ventricular size is normal. There is normal pulmonary artery systolic pressure.  3. The mitral valve is normal in structure. Trivial mitral valve regurgitation. No evidence of mitral stenosis.  4. The aortic valve is tricuspid. Aortic valve regurgitation is not visualized. No aortic stenosis is present.  5. The inferior vena cava is normal in size with greater than 50% respiratory variability, suggesting right atrial pressure of 3 mmHg. FINDINGS  Left Ventricle: Left ventricular ejection fraction, by estimation, is 55 to  60%. The left ventricle has normal function. The left ventricle has no regional wall motion abnormalities. The average left ventricular global longitudinal strain is -19.2 %. The global longitudinal strain is normal. The left ventricular internal cavity size was normal in size. There is no left ventricular hypertrophy. Left ventricular diastolic parameters were normal. Right Ventricle: The right ventricular size is normal. Right ventricular systolic function is mildly reduced. There is normal pulmonary artery systolic pressure. The tricuspid regurgitant velocity is 1.98 m/s, and with an assumed right atrial pressure of  3 mmHg, the estimated right ventricular systolic pressure is 47.0 mmHg. Left Atrium: Left atrial size was normal in size. Right Atrium: Right atrial size was normal  in size. Pericardium: There is no evidence of pericardial effusion. Mitral Valve: The mitral valve is normal in structure. Trivial mitral valve regurgitation. No evidence of mitral valve stenosis. Tricuspid Valve: The tricuspid valve is normal in structure. Tricuspid valve regurgitation is trivial. No evidence of tricuspid stenosis. Aortic Valve: The aortic valve is tricuspid. Aortic valve regurgitation is not visualized. No aortic stenosis is present. Pulmonic Valve: The pulmonic valve was normal in structure. Pulmonic valve regurgitation is trivial. No evidence of pulmonic stenosis. Aorta: The aortic root is normal in size and structure. Venous: The inferior vena cava is normal in size with greater than 50% respiratory variability, suggesting right atrial pressure of 3 mmHg. IAS/Shunts: No atrial level shunt detected by color flow Doppler.  LEFT VENTRICLE PLAX 2D LVIDd:         5.00 cm   Diastology LVIDs:         3.30 cm   LV e' medial:    7.94 cm/s LV PW:         1.00 cm   LV E/e' medial:  10.6 LV IVS:        0.80 cm   LV e' lateral:   9.36 cm/s LVOT diam:     2.00 cm   LV E/e' lateral: 9.0 LV SV:         73 LV SV Index:   38        2D  Longitudinal Strain LVOT Area:     3.14 cm  2D Strain GLS Avg:     -19.2 %  RIGHT VENTRICLE RV Basal diam:  3.10 cm RV S prime:     8.81 cm/s TAPSE (M-mode): 2.0 cm LEFT ATRIUM             Index        RIGHT ATRIUM           Index LA diam:        3.80 cm 1.97 cm/m   RA Area:     11.30 cm LA Vol (A2C):   48.6 ml 25.13 ml/m  RA Volume:   23.30 ml  12.05 ml/m LA Vol (A4C):   34.8 ml 18.00 ml/m LA Biplane Vol: 42.0 ml 21.72 ml/m  AORTIC VALVE LVOT Vmax:   112.00 cm/s LVOT Vmean:  71.000 cm/s LVOT VTI:    0.231 m  AORTA Ao Root diam: 3.00 cm MITRAL VALVE               TRICUSPID VALVE MV Area (PHT): 3.20 cm    TR Peak grad:   15.7 mmHg MV Decel Time: 237 msec    TR Vmax:        198.00 cm/s MV E velocity: 84.00 cm/s MV A velocity: 64.70 cm/s  SHUNTS MV E/A ratio:  1.30        Systemic VTI:  0.23 m                            Systemic Diam: 2.00 cm Kirk Ruths MD Electronically signed by Kirk Ruths MD Signature Date/Time: 07/31/2021/12:40:09 PM    Final     ELIGIBLE FOR AVAILABLE RESEARCH PROTOCOL:   ASSESSMENT: 45 y.o. [place] woman status post left breast upper inner lumpectomy 06/04/2020 for a pT2 pN0 invasive ductal carcinoma grade 2, triple positive, with an MIB-1 of 30%  (A) a single left  axillary lymph node was removed  (1) the patient refused all adjuvant treatment (antiestrogens, radiation,  or chemotherapy chemotherapy or immunotherapy).  METASTATIC DISEASE July 2022 involving bone (2) status post right femoral intramedullary nailing for impending fracture 04/01/2021, with reamings showing metastatic breast cancer, HER2 positive, now estrogen receptor weakly to moderately positive and progesterone receptor negative.  (3) initial staging studies including CT of the chest abdomen and pelvis without contrast, bone scan, MR angio of the head and MRI of the brain without contrast obtained June and July 2022 showed no visceral disease, multiple bone lesions  (4) status post palliative  radiation to the right iliac bone and hip 05/20/2021 through 06/03/2021.  (5) trastuzumab (Hylecta) started 06/11/2021  (A) unable to tolerate epratuzumab (Phesgo)  (5) started tamoxifen November 2022  PLAN: I reviewed the whole situation with Thaila.  She understands that she has stage IV disease which is not curable.  The goal of treatment is control and we would like to be able to control disease in a way that affects her quality of life the least so she could live as normal in life as possible.  We reviewed the fact that if she had a triple negative tumor.  What we find on the biopsy however is strongly HER2 positive but weakly estrogen positive and progesterone negative.  It could be that decalcification affected the results but it also could be that her metastatic clone is more dedifferentiated and has lost estrogen dependence.  There is really has to be taken into account as we consider treatments for her.  We reviewed all the treatment options which include chemotherapy, radiation, anti-HER2 treatments of various sorts, and thigh estrogen treatments of various sorts, and bone specific agents.  She is already on trastuzumab and she understands that she may will be on this indefinitely, for many years but that if there is disease progression we have more intense versions of trastuzumab which however have more side effects.  She understands that she will need an echocardiogram every 3 months initially but that after 1 or 2 years we relaxed that to every 6 months and sometimes to yearly  For the antiestrogen I would recommend goserelin/Zoladex given monthly.  This will "turn off" the ovaries without destroying the ovaries.  It we will put her in menopause and she understands what menopausal symptoms are like.  The effectiveness of this has been compared with CMF and it is comparable ineffectiveness to that form of chemo.  It also may obviate her need to take a pill which is something she finds  difficult.  I also would recommend denosumab/Xgeva.  This is also given monthly.  This not only will reduce the risk of further fractures but also prolong her survival.  Today she expressed interest in pursuing these options.  She already has an appointment with Dr. Lindi Adie next week at the time of her next Herceptin dose.  He will have a copy of this note and of course I wrote the information down for her so she can review it as well.  Total encounter time 65 minutes  Natalie Cruel, MD   08/27/2021 4:49 PM Medical Oncology and Hematology [facility] [address] [place], [place] [address] Tel. [phone]    Fax. [phone]

## 2021-08-31 NOTE — Assessment & Plan Note (Signed)
04/11/2020:Patient palpated a left breast lump. Mammogram on 03/08/20 showed a 3.0cm mass at the 11 o'clock position with no axillary adenopathy. 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 Patient refused adjuvant chemo, adjuvant radiation and adjuvant antiestrogen therapy.  Patient went to orthopedics for right hip pain. Imaging revealed bone metastasis. CT CAP: Cortical destruction of the right femoral neck consistent with skeletal metastases at risk for pathologic fracture. Lucent lesion in the left iliac bone and L3 vertebral body consistent with multifocal skeletal metastases. --------------------------------------------------------------- 04/01/2021: Femoral intramedullary nail Pathology review: Metastatic breast cancer ER 2% PR 0% HER2 3+ positive  Treatment plan: Tamoxifen 20 mgwas prescribed on7/19/2022(discontinued by patient because she felt that the risks and benefits did not support it),subcutaneous Herceptin Perjeta(Phesgo)injection (Phesgo)started8/24/2022, switched to Herceptin Hylecta 06/11/2021 (Fixed drug eruption, profound diarrheato Phesgo) Palliative radiation to the iliac bone and hip:05/20/2021-06/03/2021  CT CAP 08/08/21: Numerous new sclerotic bone mets  Plan: Goserilin with Anastrozole along with Herceptin Other options include Enhertu and Kadcyla as well Tucatinib and Xeloda with Herceptin

## 2021-08-31 NOTE — Progress Notes (Signed)
Patient Care Team: Pcp, No as PCP - [facility] Rockwell [place], RN as Oncology Nurse Navigator Mauro Kaufmann, RN as Oncology Nurse Navigator  DIAGNOSIS:    ICD-10-CM   1. Malignant neoplasm of upper-inner quadrant of left breast in female, estrogen receptor positive ([place])  C50.212    Z17.0       SUMMARY OF ONCOLOGIC HISTORY: Oncology History  Malignant neoplasm of upper-inner quadrant of left breast in female, estrogen receptor positive ([place])  04/11/2020 Initial Diagnosis   Patient palpated a left breast lump. Mammogram on 03/08/20 showed a 3.0cm mass at the 11 o'clock position with no axillary adenopathy. 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   05/09/2020 Cancer Staging   Staging form: Breast, AJCC 8th Edition - Clinical stage from 05/09/2020: Stage IB (cT2, cN0, cM0, G2, ER+, PR+, HER2+) - Signed by Nicholas Lose, MD on 05/09/2020    06/04/2020 Surgery   Left lumpectomy ([name]): invasive and in situ ductal carcinoma, 3.2cm, clear margins, one left axillary lymph node negative for carcinoma.    02/2021 Relapse/Recurrence   Patient went to orthopedics for right hip pain.  Imaging revealed bone metastasis.  CT CAP: Cortical destruction of the right femoral neck consistent with skeletal metastases at risk for pathologic fracture.  Lucent lesion in the left iliac bone and L3 vertebral body consistent with multifocal skeletal metastases.   06/11/2021 -  Chemotherapy   Patient is on Treatment Plan : BREAST Trastuzumab SQ (Hylecta) q21d       CHIEF COMPLIANT: Follow-up of left breast cancer on Herceptin Hylecta  INTERVAL HISTORY: Natalie [facility] is a 45 y.o. with above-mentioned history of left breast cancer who underwent a left lumpectomy, completed radiation therapy, and currently on chemotherapy with Hylecta. She presents to the clinic today for cycle 5.  Complaining of worsening pain in the right hip  as well as difficulty ambulating as well as right leg swelling.  She has been working 12-hour shifts for the past 3 days and is completely exhausted with lack of sleep.  ALLERGIES:  is allergic to adhesive [tape], phesgo [pertuz-trastuz-hyaluron-zzxf], tetanus toxoid, succinylcholine, and contrast media [iodinated diagnostic agents].  MEDICATIONS:  Current Outpatient Medications  Medication Sig Dispense Refill   ALPRAZolam (XANAX) 0.5 MG tablet Take 1 tablet (0.5 mg total) by mouth 3 (three) times daily as needed for anxiety. 60 tablet 3   calcium carbonate (OS-CAL) 600 MG TABS tablet Take 600 mg by mouth daily with breakfast.     gabapentin (NEURONTIN) 300 MG capsule Take 1 capsule (300 mg total) by mouth at bedtime. 30 capsule 3   HYDROmorphone (DILAUDID) 2 MG tablet Take 1 tablet (2 mg total) by mouth every 12 (twelve) hours as needed for severe pain. 60 tablet 0   Multiple Vitamins-Minerals (MULTIVITAMIN WITH MINERALS) tablet Take 1 tablet by mouth daily.     predniSONE (DELTASONE) 50 MG tablet Before CT scan for contrast allergy at 11 hours, 7 hours and 1 hour before scan 3 tablet 0   tamoxifen (NOLVADEX) 20 MG tablet Take 1 tablet (20 mg total) by mouth daily. 90 tablet 3   Vitamin D, Cholecalciferol, 25 MCG (1000 UT) TABS Take 1 tablet by mouth daily. 60 tablet    No current facility-administered medications for this visit.    PHYSICAL EXAMINATION: ECOG PERFORMANCE STATUS: 1 - Symptomatic but completely ambulatory  Vitals:   09/01/21 1441  BP: (!) 145/73  Pulse: (!) 101  Resp: 18  Temp: 97.9 F (36.6 C)  SpO2: 100%   Filed Weights   09/01/21 1441  Weight: 186 lb 8 oz (84.6 kg)    LABORATORY DATA:  I have reviewed the data as listed CMP Latest Ref Rng & Units 08/04/2021 06/03/2021 04/02/2021  Glucose 70 - 99 mg/dL 104(H) 105(H) 134(H)  BUN 6 - 20 mg/dL '8 9 12  ' Creatinine 0.44 - 1.00 mg/dL 0.84 0.80 0.88  Sodium 135 - 145 mmol/L 142 139 137  Potassium 3.5 - 5.1 mmol/L  4.5 3.7 4.5  Chloride 98 - 111 mmol/L 108 108 106  CO2 22 - 32 mmol/L 24 22 20(L)  Calcium 8.9 - 10.3 mg/dL 9.7 9.2 9.4  Total Protein 6.5 - 8.1 g/dL 8.1 7.6 -  Total Bilirubin 0.3 - 1.2 mg/dL 0.3 0.3 -  Alkaline Phos 38 - 126 U/L 117 301(H) -  AST 15 - 41 U/L 16 19 -  ALT 0 - 44 U/L 18 24 -    Lab Results  Component Value Date   WBC 9.6 08/04/2021   HGB 11.4 (L) 08/04/2021   HCT 36.4 08/04/2021   MCV 87.7 08/04/2021   PLT 371 08/04/2021   NEUTROABS 7.1 08/04/2021    ASSESSMENT & PLAN:  Malignant neoplasm of upper-inner quadrant of left breast in female, estrogen receptor positive ([place]) 04/11/2020:Patient palpated a left breast lump. Mammogram on 03/08/20 showed a 3.0cm mass at the 11 o'clock position with no axillary adenopathy. 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  Patient refused adjuvant chemo, adjuvant radiation and adjuvant antiestrogen therapy.   Patient went to orthopedics for right hip pain.  Imaging revealed bone metastasis.  CT CAP: Cortical destruction of the right femoral neck consistent with skeletal metastases at risk for pathologic fracture.  Lucent lesion in the left iliac bone and L3 vertebral body consistent with multifocal skeletal metastases. --------------------------------------------------------------- 04/01/2021: Femoral intramedullary nail  Pathology review: Metastatic breast cancer ER 2% PR 0% HER2 3+ positive   Treatment plan: Tamoxifen 20 mg was prescribed on 04/08/2021 (discontinued by patient because she felt that the risks and benefits did not support it), subcutaneous Herceptin Perjeta (Phesgo) injection (Phesgo) started 05/14/2021, switched to Herceptin Hylecta 06/11/2021 (Fixed drug eruption, profound diarrhea to  Phesgo) Palliative radiation to the iliac bone and hip: 05/20/2021-06/03/2021  CT CAP 08/08/21: Numerous new sclerotic bone mets  Plan: Patient has numerous treatment options.  Currently she wants to continue with tamoxifen which she started about a month ago after the worsening bone scan report.  We will continue with the tamoxifen for another month and then recheck her with a bone scan.  If the bone scan shows worsening then we will consider treatment change to Faslodex I will consider molecular testing to determine best treatment approach. Severe exhaustion from lack of sleep: We decided to hold off on Herceptin today and bring her back in the next couple of days to give the injection.   No orders of the defined types were placed in this encounter.  The patient has a good understanding of the overall plan. she agrees with it. she will call with any problems that may develop before the next visit here.  Total time spent: 30 mins including face to face time and time spent for  planning, charting and coordination of care  Rulon Eisenmenger, MD, MPH 09/01/2021  I, Thana Ates, am acting as scribe for Dr. Nicholas Lose.  I have reviewed the above documentation for accuracy and completeness, and I agree with the above.

## 2021-09-01 ENCOUNTER — Inpatient Hospital Stay (HOSPITAL_BASED_OUTPATIENT_CLINIC_OR_DEPARTMENT_OTHER): Payer: 59 | Admitting: Hematology and Oncology

## 2021-09-01 ENCOUNTER — Inpatient Hospital Stay: Payer: 59

## 2021-09-01 ENCOUNTER — Other Ambulatory Visit: Payer: Self-pay

## 2021-09-01 VITALS — BP 145/73 | HR 101 | Temp 97.9°F | Resp 18 | Ht 65.0 in | Wt 186.5 lb

## 2021-09-01 DIAGNOSIS — Z5112 Encounter for antineoplastic immunotherapy: Secondary | ICD-10-CM | POA: Diagnosis not present

## 2021-09-01 DIAGNOSIS — M84551G Pathological fracture in neoplastic disease, right femur, subsequent encounter for fracture with delayed healing: Secondary | ICD-10-CM

## 2021-09-01 DIAGNOSIS — C7951 Secondary malignant neoplasm of bone: Secondary | ICD-10-CM | POA: Diagnosis not present

## 2021-09-01 DIAGNOSIS — C50919 Malignant neoplasm of unspecified site of unspecified female breast: Secondary | ICD-10-CM

## 2021-09-01 DIAGNOSIS — C50212 Malignant neoplasm of upper-inner quadrant of left female breast: Secondary | ICD-10-CM | POA: Diagnosis not present

## 2021-09-01 DIAGNOSIS — Z170 Estrogen receptor positive status [ER+]: Secondary | ICD-10-CM

## 2021-09-02 ENCOUNTER — Telehealth: Payer: Self-pay | Admitting: Hematology and Oncology

## 2021-09-02 NOTE — Telephone Encounter (Signed)
Scheduled appointment per 12/12 los. Left message with detail time.

## 2021-09-03 ENCOUNTER — Encounter: Payer: Self-pay | Admitting: Hematology and Oncology

## 2021-09-03 ENCOUNTER — Inpatient Hospital Stay: Payer: 59

## 2021-09-04 ENCOUNTER — Inpatient Hospital Stay: Payer: 59 | Admitting: Hematology and Oncology

## 2021-09-04 ENCOUNTER — Other Ambulatory Visit: Payer: Self-pay

## 2021-09-04 ENCOUNTER — Telehealth: Payer: Self-pay

## 2021-09-04 ENCOUNTER — Inpatient Hospital Stay: Payer: 59

## 2021-09-04 VITALS — BP 110/69 | HR 60 | Temp 99.3°F | Resp 18 | Wt 191.5 lb

## 2021-09-04 DIAGNOSIS — Z170 Estrogen receptor positive status [ER+]: Secondary | ICD-10-CM | POA: Diagnosis not present

## 2021-09-04 DIAGNOSIS — C50212 Malignant neoplasm of upper-inner quadrant of left female breast: Secondary | ICD-10-CM | POA: Diagnosis not present

## 2021-09-04 DIAGNOSIS — Z5112 Encounter for antineoplastic immunotherapy: Secondary | ICD-10-CM | POA: Diagnosis not present

## 2021-09-04 DIAGNOSIS — C7951 Secondary malignant neoplasm of bone: Secondary | ICD-10-CM | POA: Diagnosis not present

## 2021-09-04 MED ORDER — TRASTUZUMAB-HYALURONIDASE-OYSK 600-10000 MG-UNT/5ML ~~LOC~~ SOLN
600.0000 mg | Freq: Once | SUBCUTANEOUS | Status: AC
  Administered 2021-09-04: 600 mg via SUBCUTANEOUS
  Filled 2021-09-04: qty 5

## 2021-09-04 NOTE — Progress Notes (Signed)
Per Lenna Sciara, Pharmacist Premeds have been d/c.   Pt has tolerated last two treatments without premeds.

## 2021-09-04 NOTE — Patient Instructions (Signed)
[facility] MEDICAL ONCOLOGY  Discharge Instructions: Thank you for choosing [facility] to provide your oncology and hematology care.   If you have a lab appointment with the Cancer Center, please go directly to the Cancer Center and check in at the registration area.   Wear comfortable clothing and clothing appropriate for easy access to any Portacath or PICC line.   We strive to give you quality time with your provider. You may need to reschedule your appointment if you arrive late (15 or more minutes).  Arriving late affects you and other patients whose appointments are after yours.  Also, if you miss three or more appointments without notifying the office, you may be dismissed from the clinic at the provider's discretion.      For prescription refill requests, have your pharmacy contact our office and allow 72 hours for refills to be completed.    Today you received the following chemotherapy and/or immunotherapy agents: Herceptin Hylecta.     To help prevent nausea and vomiting after your treatment, we encourage you to take your nausea medication as directed.  BELOW ARE SYMPTOMS THAT SHOULD BE REPORTED IMMEDIATELY: . *FEVER GREATER THAN 100.4 F (38 C) OR HIGHER . *CHILLS OR SWEATING . *NAUSEA AND VOMITING THAT IS NOT CONTROLLED WITH YOUR NAUSEA MEDICATION . *UNUSUAL SHORTNESS OF BREATH . *UNUSUAL BRUISING OR BLEEDING . *URINARY PROBLEMS (pain or burning when urinating, or frequent urination) . *BOWEL PROBLEMS (unusual diarrhea, constipation, pain near the anus) . TENDERNESS IN MOUTH AND THROAT WITH OR WITHOUT PRESENCE OF ULCERS (sore throat, sores in mouth, or a toothache) . UNUSUAL RASH, SWELLING OR PAIN  . UNUSUAL VAGINAL DISCHARGE OR ITCHING   Items with * indicate a potential emergency and should be followed up as soon as possible or go to the Emergency Department if any problems should occur.  Please show the CHEMOTHERAPY ALERT CARD or  IMMUNOTHERAPY ALERT CARD at check-in to the Emergency Department and triage nurse.  Should you have questions after your visit or need to cancel or reschedule your appointment, please contact [facility] MEDICAL ONCOLOGY  Dept: [phone]  and follow the prompts.  Office hours are 8:00 a.m. to 4:30 p.m. Monday - Friday. Please note that voicemails left after 4:00 p.m. may not be returned until the following business day.  We are closed weekends and major holidays. You have access to a nurse at all times for urgent questions. Please call the main number to the clinic Dept: [phone] and follow the prompts.   For any non-urgent questions, you may also contact your provider using MyChart. We now offer e-Visits for anyone 18 and older to request care online for non-urgent symptoms. For details visit [email or web address].   Also download the MyChart app! Go to the app store, search "MyChart", open the app, select [facility], and log in with your MyChart username and password.  Due to Covid, a mask is required upon entering the hospital/clinic. If you do not have a mask, one will be given to you upon arrival. For doctor visits, patients may have 1 support person aged 18 or older with them. For treatment visits, patients cannot have anyone with them due to current Covid guidelines and our immunocompromised population.   

## 2021-09-04 NOTE — Telephone Encounter (Signed)
Received MyChart message from Pt regarding scheduling for herceptin hylecta injection. Pt called by this RN and made aware of upcoming appointment. Pt agreeable to come in today as scheduled, no further questions or concerns at this time.

## 2021-09-09 ENCOUNTER — Encounter: Payer: Self-pay | Admitting: Hematology and Oncology

## 2021-09-09 ENCOUNTER — Other Ambulatory Visit: Payer: Self-pay | Admitting: Hematology and Oncology

## 2021-09-09 MED ORDER — HYDROCODONE-ACETAMINOPHEN 5-325 MG PO TABS: 1.0000 | ORAL_TABLET | Freq: Two times a day (BID) | ORAL | 0 refills | Status: DC | PRN

## 2021-09-18 ENCOUNTER — Telehealth: Payer: Self-pay | Admitting: *Deleted

## 2021-09-18 ENCOUNTER — Ambulatory Visit (HOSPITAL_COMMUNITY)
Admission: RE | Admit: 2021-09-18 | Discharge: 2021-09-18 | Disposition: A | Discharge status: Home / Self Care | Payer: 59 | Source: Ambulatory Visit | Attending: Physician Assistant | Admitting: Physician Assistant

## 2021-09-18 ENCOUNTER — Encounter: Payer: Self-pay | Admitting: Hematology and Oncology

## 2021-09-18 ENCOUNTER — Other Ambulatory Visit: Payer: Self-pay

## 2021-09-18 ENCOUNTER — Other Ambulatory Visit: Payer: Self-pay | Admitting: Physician Assistant

## 2021-09-18 DIAGNOSIS — M7989 Other specified soft tissue disorders: Secondary | ICD-10-CM

## 2021-09-18 NOTE — Progress Notes (Signed)
VASCULAR LAB    Right lower extremity venous duplex has been performed.  See CV proc for preliminary results.  Called report to [facility], [place], RVT 09/18/2021, 2:50 PM

## 2021-09-18 NOTE — Telephone Encounter (Signed)
Pt sent Mychart message stating there was swelling in the right knee with pain. [facility] ordered doppler for pt. Doppler was scheduled at [facility] at 2 p.m. due to over bookings at [facility] declined to come in today after [place] for reading due to having to be at work. Pt was scheduled for [facility] on 12/30 at 10 a.m. for phone visit with [facility] Pt verbalized understanding

## 2021-09-19 ENCOUNTER — Inpatient Hospital Stay (HOSPITAL_BASED_OUTPATIENT_CLINIC_OR_DEPARTMENT_OTHER): Payer: 59 | Admitting: Physician Assistant

## 2021-09-19 DIAGNOSIS — C50919 Malignant neoplasm of unspecified site of unspecified female breast: Secondary | ICD-10-CM

## 2021-09-19 DIAGNOSIS — Z5112 Encounter for antineoplastic immunotherapy: Secondary | ICD-10-CM | POA: Diagnosis not present

## 2021-09-19 DIAGNOSIS — M79661 Pain in right lower leg: Secondary | ICD-10-CM | POA: Diagnosis not present

## 2021-09-19 DIAGNOSIS — C50212 Malignant neoplasm of upper-inner quadrant of left female breast: Secondary | ICD-10-CM | POA: Diagnosis not present

## 2021-09-19 DIAGNOSIS — C7951 Secondary malignant neoplasm of bone: Secondary | ICD-10-CM | POA: Diagnosis not present

## 2021-09-19 DIAGNOSIS — M7989 Other specified soft tissue disorders: Secondary | ICD-10-CM | POA: Diagnosis not present

## 2021-09-19 DIAGNOSIS — Z170 Estrogen receptor positive status [ER+]: Secondary | ICD-10-CM | POA: Diagnosis not present

## 2021-09-19 NOTE — Progress Notes (Signed)
I connected with Natalie Griffin on 09/19/21 at 10:00 AM EST by telephone and verified that I am speaking with the correct person using two identifiers.   I discussed the limitations, risks, security and privacy concerns of performing an evaluation and management service by telemedicine and the availability of in-person appointments. I also discussed with the patient that there may be a patient responsible charge related to this service. The patient expressed understanding and agreed to proceed.    Patients location: home Providers location: [place]       Symptom Management Consult note [place]    Patient Care Team: Pcp, No as PCP - General Rockwell Germany, RN as Oncology Nurse Navigator Mauro Kaufmann, RN as Oncology Nurse Navigator    Name of the patient: Natalie Griffin  [id number]  Nov 21, 1975   Date of visit: 09/19/2021    Chief complaint/ Reason for visit- right leg pain and swelling, follow up US study  Oncology History  Malignant neoplasm of upper-inner quadrant of left breast in female, estrogen receptor positive ([place])  04/11/2020 Initial Diagnosis   Patient palpated a left breast lump. Mammogram on 03/08/20 showed a 3.0cm mass at the 11 o'clock position with no axillary adenopathy. 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   05/09/2020 Cancer Staging   Staging form: Breast, AJCC 8th Edition - Clinical stage from 05/09/2020: Stage IB (cT2, cN0, cM0, G2, ER+, PR+, HER2+) - Signed by Nicholas Lose, MD on 05/09/2020    06/04/2020 Surgery   Left lumpectomy ([name]): invasive and in situ ductal carcinoma, 3.2cm, clear margins, one left axillary lymph node negative for carcinoma.    02/2021 Relapse/Recurrence   Patient went to orthopedics for right hip pain.  Imaging revealed bone metastasis.  CT CAP: Cortical destruction of the right femoral neck consistent with skeletal  metastases at risk for pathologic fracture.  Lucent lesion in the left iliac bone and L3 vertebral body consistent with multifocal skeletal metastases.   06/11/2021 -  Chemotherapy   Patient is on Treatment Plan : BREAST Trastuzumab SQ (Hylecta) q21d       Current Therapy: Herceptin Hylecta last received 09/04/21, had radiation to her right leg 05/19/21-06/03/21  Interval history- Natalie Griffin is a 45 yo female contacted via telephone for right leg pain and swelling and follow up on [place] results.  Patient states she has had right hip and knee pain x1 month.  The pain acutely worsened 2 days ago after she spent 2 hours walking around [facility] the science center. She became concerned when she was unable to get the swelling to decrease from her right leg.  She reports being used to swelling in the leg after working however it usually decreases with elevation. She states the cold weather makes her pain worse.  She describes as a soreness.  Pain will radiate to her right knee.  She states the pain in her hip is mostly positional.  She has tried applying ice and heat and gets more relief with heat she admits.  She states she first noticed the swelling in her leg upon finishing radiation 3 months ago.  She admits to limited range of motion of right leg secondary to pain.  She rates pain 7 out of 10 in severity.  No over-the-counter medications for pain tried, she reports she does not tolerate NSAIDs because of stomach issues. She denies any fever, chills, decreased  sensation, overlying skin changes, numbness or tingling. She has a prescription for norco for pain prescribed by oncologist.       ROS  All other systems are reviewed and are negative for acute change except as noted in the HPI.    Allergies  Allergen Reactions   Adhesive [Tape] Other (See Comments)    Blistering with steri strips   Phesgo [Pertuz-Trastuz-Hyaluron-Zzxf] Diarrhea and Rash    Phesgo side effect: Fixed drug eruption, profound  diarrhea Therefore we will discontinue Phesgo and start her on Herceptin Hylecta Inj   Tetanus Toxoid Anaphylaxis   Succinylcholine Other (See Comments)    Her Body does not have enough enzymes to carry it out of her body   Contrast Media [Iodinated Contrast Media] Rash    Rash/hives     Past Medical History:  Diagnosis Date   Anemia    Bone metastasis ([facility])    Breast cancer ([facility]) 2021   Left breast invasive ductal carcinoma   Cancer ([facility]) 2021   Left breast   Family history of adverse reaction to anesthesia    Father and Aunt have pseudocholinesterase deficiency - Trouble waking up from anesthesia   History of hiatal hernia    History of kidney stones    noted on CT Left nonobstructive     Past Surgical History:  Procedure Laterality Date   BREAST LUMPECTOMY WITH RADIOACTIVE SEED AND SENTINEL LYMPH NODE BIOPSY Left 06/04/2020   Procedure: LEFT BREAST LUMPECTOMY WITH RADIOACTIVE SEED AND SENTINEL LYMPH NODE MAPPING;  Surgeon: Erroll Luna, MD;  Location: [facility];  Service: General;  Laterality: Left;  PEC BLOCK, RNFA   FEMUR IM NAIL Right 04/01/2021   Procedure: INTRAMEDULLARY (IM) NAIL FEMORAL;  Surgeon: [name], MD;  Location: [facility];  Service: Orthopedics;  Laterality: Right;   WISDOM TOOTH EXTRACTION      Social History   Socioeconomic History   Marital status: Single    Spouse name: Not on file   Number of children: Not on file   Years of education: Not on file   Highest education level: Not on file  Occupational History   Not on file  Tobacco Use   Smoking status: Former    Types: Cigarettes    Quit date: 05/19/2020    Years since quitting: 1.3   Smokeless tobacco: Never  Vaping Use   Vaping Use: Never used  Substance and Sexual Activity   Alcohol use: Yes    Comment: social drinker   Drug use: Never   Sexual activity: Not Currently  Other Topics Concern   Not on file  Social History Narrative   Not on file   Social Determinants of Health    Financial Resource Strain: Not on file  Food Insecurity: Not on file  Transportation Needs: Not on file  Physical Activity: Not on file  Stress: Not on file  Social Connections: Not on file  Intimate Partner Violence: Not At Risk   Fear of Current or Ex-Partner: No   Emotionally Abused: No   Physically Abused: No   Sexually Abused: No    No family history on file.   Current Outpatient Medications:    ALPRAZolam (XANAX) 0.5 MG tablet, Take 1 tablet (0.5 mg total) by mouth 3 (three) times daily as needed for anxiety., Disp: 60 tablet, Rfl: 3   calcium carbonate (OS-CAL) 600 MG TABS tablet, Take 600 mg by mouth daily with breakfast., Disp: , Rfl:    gabapentin (NEURONTIN) 300 MG capsule,  Take 1 capsule (300 mg total) by mouth at bedtime., Disp: 30 capsule, Rfl: 3   HYDROcodone-acetaminophen (NORCO/VICODIN) 5-325 MG tablet, Take 1 tablet by mouth every 12 (twelve) hours as needed for severe pain., Disp: 30 tablet, Rfl: 0   Multiple Vitamins-Minerals (MULTIVITAMIN WITH MINERALS) tablet, Take 1 tablet by mouth daily., Disp: , Rfl:    predniSONE (DELTASONE) 50 MG tablet, Before CT scan for contrast allergy at 11 hours, 7 hours and 1 hour before scan, Disp: 3 tablet, Rfl: 0   tamoxifen (NOLVADEX) 20 MG tablet, Take 1 tablet (20 mg total) by mouth daily., Disp: 90 tablet, Rfl: 3   Vitamin D, Cholecalciferol, 25 MCG (1000 UT) TABS, Take 1 tablet by mouth daily., Disp: 60 tablet, Rfl:   PHYSICAL EXAM: ECOG FS:1 - Symptomatic but completely ambulatory   There were no vitals filed for this visit.  Patient speaking in clear sentences, no respiratory distress    LABORATORY DATA: I have reviewed the data as listed CBC Latest Ref Rng & Units 08/04/2021 06/03/2021 04/02/2021  WBC 4.0 - 10.5 K/uL 9.6 8.9 15.1(H)  Hemoglobin 12.0 - 15.0 g/dL 11.4(L) 10.8(L) 10.5(L)  Hematocrit 36.0 - 46.0 % 36.4 33.2(L) 33.3(L)  Platelets 150 - 400 K/uL 371 309 287     CMP Latest Ref Rng & Units 08/04/2021  06/03/2021 04/02/2021  Glucose 70 - 99 mg/dL 104(H) 105(H) 134(H)  BUN 6 - 20 mg/dL _0 Creatinine 0.44 - 1.00 mg/dL 0.84 0.80 0.88  Sodium 135 - 145 mmol/L 142 139 137  Potassium 3.5 - 5.1 mmol/L 4.5 3.7 4.5  Chloride 98 - 111 mmol/L 108 108 106  CO2 22 - 32 mmol/L 24 22 20(L)  Calcium 8.9 - 10.3 mg/dL 9.7 9.2 9.4  Total Protein 6.5 - 8.1 g/dL 8.1 7.6 -  Total Bilirubin 0.3 - 1.2 mg/dL 0.3 0.3 -  Alkaline Phos 38 - 126 U/L 117 301(H) -  AST 15 - 41 U/L 16 19 -  ALT 0 - 44 U/L 18 24 -       RADIOGRAPHIC STUDIES: I have personally reviewed the radiological images as listed and agreed with the findings in the report. No images are attached to the encounter. VAS [place] LOWER EXTREMITY VENOUS (DVT)  Result Date: 09/18/2021  Lower Venous DVT Study Patient Name:  LUCINDY BOREL  Date of Exam:   09/18/2021 Medical Rec #: [id number]          Accession #:    [id number] Date of Birth: 15-Dec-1975          Patient Gender: F Patient Age:   56 years Exam [facility]  [facility] Procedure:      VAS [place] LOWER EXTREMITY VENOUS (DVT) Referring Phys: Sherol Dade --------------------------------------------------------------------------------  Indications: Hip pain radiating to knee.  Risk Factors: Cancer Breast cancer with mets to the bone. Comparison Study: No prior study on file Performing Technologist: Sharion Dove RVS  Examination Guidelines: A complete evaluation includes B-mode imaging, spectral Doppler, color Doppler, and power Doppler as needed of all accessible portions of each vessel. Bilateral testing is considered an integral part of a complete examination. Limited examinations for reoccurring indications may be performed as noted. The reflux portion of the exam is performed with the patient in reverse Trendelenburg.  +---------+---------------+---------+-----------+----------+--------------+  RIGHT     Compressibility Phasicity Spontaneity Properties Thrombus Aging   +---------+---------------+---------+-----------+----------+--------------+  CFV       Full  Yes       Yes                                    +---------+---------------+---------+-----------+----------+--------------+  SFJ       Full                                                             +---------+---------------+---------+-----------+----------+--------------+  FV Prox   Full                                                             +---------+---------------+---------+-----------+----------+--------------+  FV Mid    Full                                                             +---------+---------------+---------+-----------+----------+--------------+  FV Distal Full                                                             +---------+---------------+---------+-----------+----------+--------------+  PFV       Full                                                             +---------+---------------+---------+-----------+----------+--------------+  POP       Full            Yes       Yes                                    +---------+---------------+---------+-----------+----------+--------------+  PTV       Full                                                             +---------+---------------+---------+-----------+----------+--------------+  PERO      Full                                                             +---------+---------------+---------+-----------+----------+--------------+   +----+---------------+---------+-----------+----------+--------------+  LEFT Compressibility Phasicity Spontaneity Properties Thrombus Aging  +----+---------------+---------+-----------+----------+--------------+  CFV  Full            Yes       Yes                                    +----+---------------+---------+-----------+----------+--------------+     Summary: RIGHT: - There is no evidence of deep vein thrombosis in the lower extremity.  - No cystic structure found in the popliteal fossa.   LEFT: - No evidence of common femoral vein obstruction.  *See table(s) above for measurements and observations. Electronically signed by Harold Barban MD on 09/18/2021 at 8:00:41 PM.    Final      ASSESSMENT & PLAN: Patient is a 45 y.o. female with history of estrogen receptor positive left breast cancer with bone metastasis currently followed by oncologist Dr. Lindi Adie.  #) Right leg pain and swelling- DVT study is negative. I informed patient of the results. Engaged in shared decision making with patient regarding next steps in work up of her leg pain and swelling. She is planning to reach out to her physical therapist after the new year to see if strengthening exercises will help and will try wearing compression stockings with continued elevation as much as possible. Discussed the possibility of there being new bone lesions of pathologic fracture and needing further imaging to rule that out. She is schedule for a whole body bone scan on 1/12. Patient plans to keep that appointment although knows to call the clinic should pain worsen so she can be evaluated in person.  #)Breast cancer- continue treatment per oncologist.   Visit Diagnosis: 1. Pain and swelling of right lower leg   2. Metastatic breast cancer ([place])      No orders of the defined types were placed in this encounter.   All questions were answered. The patient knows to call the clinic with any problems, questions or concerns. No barriers to learning was detected.  Time spent with patient on telephone encounter: 10 minutes   Thank you for allowing me to participate in the care of this patient.    Barrie Folk, PA-C Department of Hematology/Oncology [facility] at [facility] Phone: [phone]  [phone]    09/19/2021 11:05 AM

## 2021-09-22 DIAGNOSIS — Z20822 Contact with and (suspected) exposure to covid-19: Secondary | ICD-10-CM | POA: Diagnosis not present

## 2021-09-23 ENCOUNTER — Encounter: Payer: Self-pay | Admitting: General Practice

## 2021-09-23 ENCOUNTER — Inpatient Hospital Stay: Payer: 59 | Attending: Hematology and Oncology

## 2021-09-23 ENCOUNTER — Other Ambulatory Visit: Payer: Self-pay

## 2021-09-23 VITALS — BP 141/76 | HR 76 | Temp 98.3°F | Resp 17 | Wt 185.5 lb

## 2021-09-23 DIAGNOSIS — Z170 Estrogen receptor positive status [ER+]: Secondary | ICD-10-CM | POA: Diagnosis not present

## 2021-09-23 DIAGNOSIS — C7951 Secondary malignant neoplasm of bone: Secondary | ICD-10-CM | POA: Diagnosis not present

## 2021-09-23 DIAGNOSIS — C50212 Malignant neoplasm of upper-inner quadrant of left female breast: Secondary | ICD-10-CM | POA: Diagnosis not present

## 2021-09-23 DIAGNOSIS — Z5112 Encounter for antineoplastic immunotherapy: Secondary | ICD-10-CM | POA: Diagnosis not present

## 2021-09-23 MED ORDER — TRASTUZUMAB-HYALURONIDASE-OYSK 600-10000 MG-UNT/5ML ~~LOC~~ SOLN
600.0000 mg | Freq: Once | SUBCUTANEOUS | Status: AC
  Administered 2021-09-23: 600 mg via SUBCUTANEOUS
  Filled 2021-09-23: qty 5

## 2021-09-23 NOTE — Progress Notes (Signed)
[facility] Spiritual Care Note  Left voicemail encouraging return call. Plan to phone after bone scan next week in case a pastoral check-in would be helpful.   [place], [place], [facility] Pager [phone] Voicemail [phone]

## 2021-09-23 NOTE — Patient Instructions (Signed)
[facility] MEDICAL [facility]COLOG[facility]  Discharge Instructions: Thank you for choosing [facility] to provide your oncology and hematology care.   If you have a lab appointment with the Cancer Center, please go directly to the Cancer Center and check in at the registration area.   Wear comfortable clothing and clothing appropriate for easy access to any Portacath or PICC line.   We strive to give you quality time with your provider. You may need to reschedule your appointment if you arrive late (15 or more minutes).  Arriving late affects you and other patients whose appointments are after yours.  Also, if you miss three or more appointments without notifying the office, you may be dismissed from the clinic at the provider's discretion.      For prescription refill requests, have your pharmacy contact our office and allow 72 hours for refills to be completed.    Today you received the following chemotherapy and/or immunotherapy agents herceptin hylecta    To help prevent nausea and vomiting after your treatment, we encourage you to take your nausea medication as directed.  BELOW ARE SYMPTOMS THAT SHOULD BE REPORTED IMMEDIATELY: . *FEVER GREATER THAN 100.4 F (38 C) OR HIGHER . *CHILLS OR SWEATING . *NAUSEA AND VOMITING THAT IS NOT CONTROLLED WITH YOUR NAUSEA MEDICATION . *UNUSUAL SHORTNESS OF BREATH . *UNUSUAL BRUISING OR BLEEDING . *URINARY PROBLEMS (pain or burning when urinating, or frequent urination) . *BOWEL PROBLEMS (unusual diarrhea, constipation, pain near the anus) . TENDERNESS IN MOUTH AND THROAT WITH OR WITHOUT PRESENCE OF ULCERS (sore throat, sores in mouth, or a toothache) . UNUSUAL RASH, SWELLING OR PAIN  . UNUSUAL VAGINAL DISCHARGE OR ITCHING   Items with * indicate a potential emergency and should be followed up as soon as possible or go to the Emergency Department if any problems should occur.  Please show the CHEMOTHERAPY ALERT CARD or IMMUNOTHERAPY  ALERT CARD at check-in to the Emergency Department and triage nurse.  Should you have questions after your visit or need to cancel or reschedule your appointment, please contact [facility]  Dept: [phone]  and follow the prompts.  Office hours are 8:00 a.m. to 4:30 p.m. Monday - Friday. Please note that voicemails left after 4:00 p.m. may not be returned until the following business day.  We are closed weekends and major holidays. You have access to a nurse at all times for urgent questions. Please call the main number to the clinic Dept: [phone] and follow the prompts.   For any non-urgent questions, you may also contact your provider using MyChart. We now offer e-Visits for anyone 18 and older to request care online for non-urgent symptoms. For details visit [email or web address].   Also download the MyChart app! Go to the app store, search "MyChart", open the app, select [facility], and log in with your MyChart username and password.  Due to Covid, a mask is required upon entering the hospital/clinic. If you do not have a mask, one will be given to you upon arrival. For doctor visits, patients may have 1 support person aged 18 or older with them. For treatment visits, patients cannot have anyone with them due to current Covid guidelines and our immunocompromised population.   

## 2021-09-25 ENCOUNTER — Other Ambulatory Visit: Payer: Self-pay | Admitting: Hematology and Oncology

## 2021-09-26 ENCOUNTER — Encounter: Payer: Self-pay | Admitting: Hematology and Oncology

## 2021-09-26 MED ORDER — HYDROCODONE-ACETAMINOPHEN 5-325 MG PO TABS: 1.0000 | ORAL_TABLET | Freq: Two times a day (BID) | ORAL | 0 refills | Status: DC | PRN

## 2021-09-30 ENCOUNTER — Other Ambulatory Visit: Payer: Self-pay | Admitting: Hematology and Oncology

## 2021-09-30 MED ORDER — HYDROCODONE-ACETAMINOPHEN 5-325 MG PO TABS: 2.0000 | ORAL_TABLET | Freq: Two times a day (BID) | ORAL | 0 refills | Status: DC | PRN

## 2021-10-02 ENCOUNTER — Ambulatory Visit (HOSPITAL_COMMUNITY)
Admission: RE | Admit: 2021-10-02 | Discharge: 2021-10-02 | Disposition: A | Discharge status: Home / Self Care | Payer: 59 | Source: Ambulatory Visit | Attending: Hematology and Oncology | Admitting: Hematology and Oncology

## 2021-10-02 ENCOUNTER — Other Ambulatory Visit: Payer: Self-pay

## 2021-10-02 ENCOUNTER — Encounter (HOSPITAL_COMMUNITY)
Admission: RE | Admit: 2021-10-02 | Discharge: 2021-10-02 | Disposition: A | Discharge status: Home / Self Care | Payer: 59 | Source: Ambulatory Visit | Attending: Hematology and Oncology | Admitting: Hematology and Oncology

## 2021-10-02 DIAGNOSIS — M84551G Pathological fracture in neoplastic disease, right femur, subsequent encounter for fracture with delayed healing: Secondary | ICD-10-CM | POA: Insufficient documentation

## 2021-10-02 DIAGNOSIS — C7951 Secondary malignant neoplasm of bone: Secondary | ICD-10-CM | POA: Diagnosis not present

## 2021-10-02 DIAGNOSIS — M542 Cervicalgia: Secondary | ICD-10-CM | POA: Diagnosis not present

## 2021-10-02 DIAGNOSIS — C50919 Malignant neoplasm of unspecified site of unspecified female breast: Secondary | ICD-10-CM | POA: Insufficient documentation

## 2021-10-02 DIAGNOSIS — Z170 Estrogen receptor positive status [ER+]: Secondary | ICD-10-CM | POA: Diagnosis not present

## 2021-10-02 DIAGNOSIS — C50212 Malignant neoplasm of upper-inner quadrant of left female breast: Secondary | ICD-10-CM | POA: Diagnosis not present

## 2021-10-02 DIAGNOSIS — M546 Pain in thoracic spine: Secondary | ICD-10-CM | POA: Diagnosis not present

## 2021-10-02 DIAGNOSIS — M25551 Pain in right hip: Secondary | ICD-10-CM | POA: Diagnosis not present

## 2021-10-02 IMAGING — NM NM BONE WHOLE BODY
2 series · 2 of 2 positions shown · non-contrast
Comparison: [DATE]

Radiographic correlation: CT chest abdomen pelvis [DATE]

CLINICAL DATA: RIGHT hip pain, cervical and thoracic spine pain,
history LEFT breast cancer with known osseous metastatic disease

EXAM:
NUCLEAR MEDICINE WHOLE BODY BONE SCAN
TECHNIQUE: Whole body anterior and posterior images were obtained approximately
3 hours after intravenous injection of radiopharmaceutical.
RADIOPHARMACEUTICALS:  21.3 mCi [KE] MDP IV

[Series 1: wbr_bone_40 whole body · 2.66mm/px · 1 of 1 slices shown (1 of 2)]
[im 1/1]
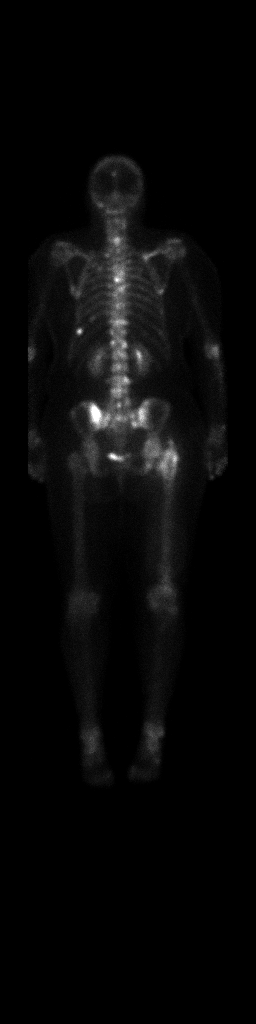

[Series 1: wbr_bone_40 whole body · 2.66mm/px · 1 of 1 slices shown (2 of 2)]
[im 1/1]
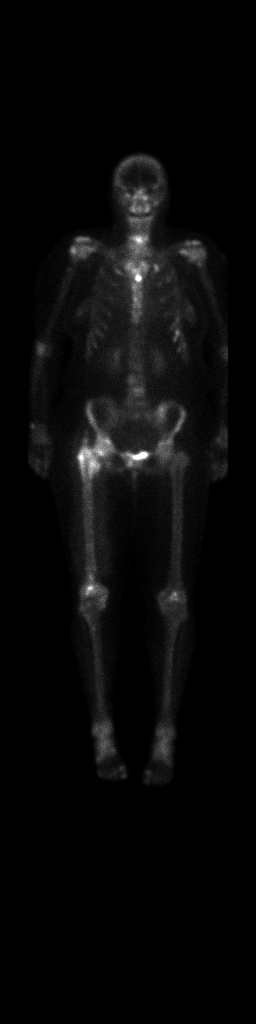

[2 of 2 positions shown; findings below may reference images not displayed]

FINDINGS: Multiple sites of abnormal osseous uptake of tracer consistent with
osseous metastases.

These include calvaria, cervical spine, thoracic spine, lumbar
spine, LEFT ribs, RIGHT scapula, pelvis, sternum at region of
sternomanubrial junction.

Uptake at the proximal and distal RIGHT femur corresponding to IM
nail, screws, and proximal RIGHT femoral metastasis.

When compared to the previous exam, no new lesions are identified.
IMPRESSION: Multiple osseous metastases with similar distribution as on previous
exam.

Metastatic lesion proximal RIGHT femur with postsurgical changes of
IM nailing again seen.

No new metastatic lesions identified.

## 2021-10-02 MED ORDER — TECHNETIUM TC 99M MEDRONATE IV KIT
21.3000 | PACK | Freq: Once | INTRAVENOUS | Status: AC
  Administered 2021-10-02: 21.3 via INTRAVENOUS

## 2021-10-09 DIAGNOSIS — M25551 Pain in right hip: Secondary | ICD-10-CM | POA: Diagnosis not present

## 2021-10-09 DIAGNOSIS — M25651 Stiffness of right hip, not elsewhere classified: Secondary | ICD-10-CM | POA: Diagnosis not present

## 2021-10-13 NOTE — Progress Notes (Signed)
Patient Care Team: Pcp, No as PCP - General Rockwell Germany, RN as Oncology Nurse Navigator Mauro Kaufmann, RN as Oncology Nurse Navigator  DIAGNOSIS:    ICD-10-CM   1. Malignant neoplasm of upper-inner quadrant of left breast in female, estrogen receptor positive ([place])  C50.212    Z17.0       SUMMARY OF ONCOLOGIC HISTORY: Oncology History  Malignant neoplasm of upper-inner quadrant of left breast in female, estrogen receptor positive ([place])  04/11/2020 Initial Diagnosis   Patient palpated a left breast lump. Mammogram on 03/08/20 showed a 3.0cm mass at the 11 o'clock position with no axillary adenopathy. 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   05/09/2020 Cancer Staging   Staging form: Breast, AJCC 8th Edition - Clinical stage from 05/09/2020: Stage IB (cT2, cN0, cM0, G2, ER+, PR+, HER2+) - Signed by Nicholas Lose, MD on 05/09/2020    06/04/2020 Surgery   Left lumpectomy ([name]): invasive and in situ ductal carcinoma, 3.2cm, clear margins, one left axillary lymph node negative for carcinoma.    02/2021 Relapse/Recurrence   Patient went to orthopedics for right hip pain.  Imaging revealed bone metastasis.  CT CAP: Cortical destruction of the right femoral neck consistent with skeletal metastases at risk for pathologic fracture.  Lucent lesion in the left iliac bone and L3 vertebral body consistent with multifocal skeletal metastases.   06/11/2021 -  Chemotherapy   Patient is on Treatment Plan : BREAST Trastuzumab SQ (Hylecta) q21d       CHIEF COMPLIANT: Follow-up of left breast cancer on Herceptin Hylecta and tamoxifen  INTERVAL HISTORY: Natalie Griffin is a 46 y.o. with above-mentioned history of left breast cancer who underwent a left lumpectomy, completed radiation therapy, and currently on chemotherapy with Hylecta. She presents to the clinic today for cycle 7.  She is complaining that she is  getting worsening pain in the mid back area and it is not improving.  She gets relief with 2 tablets of Percocets but it only last for 45 hours and the pain comes back.  She is also experiencing pain in the right hip which is musculoskeletal in nature.  She is using compression stockings which appear to prevent fluid retention.  ALLERGIES:  is allergic to adhesive [tape], phesgo [pertuz-trastuz-hyaluron-zzxf], tetanus toxoid, succinylcholine, and contrast media [iodinated contrast media].  MEDICATIONS:  Current Outpatient Medications  Medication Sig Dispense Refill   oxyCODONE-acetaminophen (PERCOCET) 10-325 MG tablet Take 1 tablet by mouth every 8 (eight) hours as needed for pain. 60 tablet 0   ALPRAZolam (XANAX) 0.5 MG tablet Take 1 tablet (0.5 mg total) by mouth 3 (three) times daily as needed for anxiety. 60 tablet 3   calcium carbonate (OS-CAL) 600 MG TABS tablet Take 600 mg by mouth daily with breakfast.     gabapentin (NEURONTIN) 300 MG capsule Take 1 capsule (300 mg total) by mouth at bedtime. 30 capsule 3   Multiple Vitamins-Minerals (MULTIVITAMIN WITH MINERALS) tablet Take 1 tablet by mouth daily.     predniSONE (DELTASONE) 50 MG tablet Before CT scan for contrast allergy at 11 hours, 7 hours and 1 hour before scan 3 tablet 0   tamoxifen (NOLVADEX) 20 MG tablet Take 1 tablet (20 mg total) by mouth daily. 90 tablet 3   Vitamin D, Cholecalciferol, 25 MCG (1000 UT) TABS Take 1 tablet by mouth daily. 60 tablet    No current facility-administered medications for this  visit.    PHYSICAL EXAMINATION: ECOG PERFORMANCE STATUS: 1 - Symptomatic but completely ambulatory  Vitals:   10/14/21 1355  BP: 120/65  Pulse: 71  Resp: 18  Temp: (!) 97.3 F (36.3 C)  SpO2: 97%   Filed Weights   10/14/21 1355  Weight: 185 lb 14.4 oz (84.3 kg)    LABORATORY DATA:  I have reviewed the data as listed CMP Latest Ref Rng & Units 08/04/2021 06/03/2021 04/02/2021  Glucose 70 - 99 mg/dL 104(H) 105(H)  134(H)  BUN 6 - 20 mg/dL '8 9 12  ' Creatinine 0.44 - 1.00 mg/dL 0.84 0.80 0.88  Sodium 135 - 145 mmol/L 142 139 137  Potassium 3.5 - 5.1 mmol/L 4.5 3.7 4.5  Chloride 98 - 111 mmol/L 108 108 106  CO2 22 - 32 mmol/L 24 22 20(L)  Calcium 8.9 - 10.3 mg/dL 9.7 9.2 9.4  Total Protein 6.5 - 8.1 g/dL 8.1 7.6 -  Total Bilirubin 0.3 - 1.2 mg/dL 0.3 0.3 -  Alkaline Phos 38 - 126 U/L 117 301(H) -  AST 15 - 41 U/L 16 19 -  ALT 0 - 44 U/L 18 24 -    Lab Results  Component Value Date   WBC 9.6 08/04/2021   HGB 11.4 (L) 08/04/2021   HCT 36.4 08/04/2021   MCV 87.7 08/04/2021   PLT 371 08/04/2021   NEUTROABS 7.1 08/04/2021    ASSESSMENT & PLAN:  Malignant neoplasm of upper-inner quadrant of left breast in female, estrogen receptor positive ([place]) /22/2021:Patient palpated a left breast lump. Mammogram on 03/08/20 showed a 3.0cm mass at the 11 o'clock position with no axillary adenopathy. 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  Patient refused adjuvant chemo, adjuvant radiation and adjuvant antiestrogen therapy.   Patient went to orthopedics for right hip pain.  Imaging revealed bone metastasis.  CT CAP: Cortical destruction of the right femoral neck consistent with skeletal metastases at risk for pathologic fracture.  Lucent lesion in the left iliac bone and L3 vertebral body consistent with multifocal skeletal metastases. --------------------------------------------------------------- 04/01/2021: Femoral intramedullary nail  Pathology review: Metastatic breast cancer ER 2% PR 0% HER2 3+ positive   Treatment plan: Tamoxifen 20 mg was prescribed on 04/08/2021 (discontinued by patient because she felt that the risks and benefits did not support it), subcutaneous Herceptin  Perjeta (Phesgo) injection (Phesgo) started 05/14/2021, switched to Herceptin Hylecta 06/11/2021 (Fixed drug eruption, profound diarrhea to Phesgo) Palliative radiation to the iliac bone and hip: 05/20/2021-06/03/2021  Resumed tamoxifen.  She is continuing tamoxifen with manageable side effects.   CT CAP 08/08/21: Numerous new sclerotic bone mets Bone scan 10/03/2021: Stable compared to before.  Numerous bone metastases calvaria, cervical spine, thoracic spine, lumbar spine, LEFT ribs, RIGHT scapula, pelvis, sternum at region of sternomanubrial junction  Pain in the mid back area: I discussed with her that we may have to obtain MRI to further evaluate this so that we can see if she would benefit from palliative radiation.  She would like to hold off on the radiation idea at this time.  I sent a new prescription for 10 mg of Percocets to be taken every 8 hours as needed.  If her pain continues to be uncontrolled then we may have to add a long-acting pain medication.  Based on stable  findings we will continue with tamoxifen and Herceptin therapy at this time.    No orders of the defined types were placed in this encounter.  The patient has a good understanding of the overall plan. she agrees with it. she will call with any problems that may develop before the next visit here.  Total time spent: 30 mins including face to face time and time spent for planning, charting and coordination of care  Rulon Eisenmenger, MD, MPH 10/14/2021  I, Thana Ates, am acting as scribe for Dr. Nicholas Lose.  I have reviewed the above documentation for accuracy and completeness, and I agree with the above.

## 2021-10-14 ENCOUNTER — Other Ambulatory Visit: Payer: Self-pay

## 2021-10-14 ENCOUNTER — Other Ambulatory Visit: Payer: Self-pay | Admitting: *Deleted

## 2021-10-14 ENCOUNTER — Inpatient Hospital Stay (HOSPITAL_BASED_OUTPATIENT_CLINIC_OR_DEPARTMENT_OTHER): Payer: 59 | Admitting: Hematology and Oncology

## 2021-10-14 ENCOUNTER — Encounter: Payer: Self-pay | Admitting: General Practice

## 2021-10-14 ENCOUNTER — Inpatient Hospital Stay: Payer: 59

## 2021-10-14 DIAGNOSIS — Z170 Estrogen receptor positive status [ER+]: Secondary | ICD-10-CM | POA: Diagnosis not present

## 2021-10-14 DIAGNOSIS — Z5112 Encounter for antineoplastic immunotherapy: Secondary | ICD-10-CM | POA: Diagnosis not present

## 2021-10-14 DIAGNOSIS — C7951 Secondary malignant neoplasm of bone: Secondary | ICD-10-CM | POA: Diagnosis not present

## 2021-10-14 DIAGNOSIS — C50212 Malignant neoplasm of upper-inner quadrant of left female breast: Secondary | ICD-10-CM | POA: Diagnosis not present

## 2021-10-14 DIAGNOSIS — Z5181 Encounter for therapeutic drug level monitoring: Secondary | ICD-10-CM

## 2021-10-14 MED ORDER — TRASTUZUMAB-HYALURONIDASE-OYSK 600-10000 MG-UNT/5ML ~~LOC~~ SOLN
600.0000 mg | Freq: Once | SUBCUTANEOUS | Status: AC
  Administered 2021-10-14: 16:00:00 600 mg via SUBCUTANEOUS
  Filled 2021-10-14: qty 5

## 2021-10-14 MED ORDER — OXYCODONE-ACETAMINOPHEN 10-325 MG PO TABS: 1.0000 | ORAL_TABLET | Freq: Three times a day (TID) | ORAL | 0 refills | Status: DC | PRN

## 2021-10-14 NOTE — Progress Notes (Signed)
Per Dr. Lindi Adie - okay to treat with echo from November 2021

## 2021-10-14 NOTE — Assessment & Plan Note (Signed)
/  22/2021:Patient palpated a left breast lump. Mammogram on 03/08/20 showed a 3.0cm mass at the 11 o'clock position with no axillary adenopathy. 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 Patient refused adjuvant chemo, adjuvant radiation and adjuvant antiestrogen therapy.  Patient went to orthopedics for right hip pain. Imaging revealed bone metastasis. CT CAP: Cortical destruction of the right femoral neck consistent with skeletal metastases at risk for pathologic fracture. Lucent lesion in the left iliac bone and L3 vertebral body consistent with multifocal skeletal metastases. --------------------------------------------------------------- 04/01/2021: Femoral intramedullary nail Pathology review: Metastatic breast cancer ER 2% PR 0% HER2 3+ positive  Treatment plan: Tamoxifen 20 mgwas prescribed on7/19/2022(discontinued by patient because she felt that the risks and benefits did not support it),subcutaneous Herceptin Perjeta(Phesgo)injection (Phesgo)started8/24/2022, switched to Herceptin Hylecta 06/11/2021 (Fixed drug eruption, profound diarrheato Phesgo) Palliative radiation to the iliac bone and hip:05/20/2021-06/03/2021 Resumed tamoxifen  CT CAP 08/08/21: Numerous new sclerotic bone mets Bone scan 10/03/2021: Stable compared to before.  Numerous bone metastases calvaria, cervical spine, thoracic spine, lumbar spine, LEFT ribs, RIGHT scapula, pelvis, sternum at region of sternomanubrial junction  Based on stable findings we will continue with tamoxifen therapy at this time.

## 2021-10-14 NOTE — Patient Instructions (Signed)
[facility]   Discharge Instructions: Thank you for choosing [facility] to provide your oncology and hematology care.   If you have a lab appointment with the [facility], please go directly to the [facility] and check in at the registration area.   Wear comfortable clothing and clothing appropriate for easy access to any Portacath or PICC line.   We strive to give you quality time with your provider. You may need to reschedule your appointment if you arrive late (15 or more minutes).  Arriving late affects you and other patients whose appointments are after yours.  Also, if you miss three or more appointments without notifying the office, you may be dismissed from the clinic at the providers discretion.      For prescription refill requests, have your pharmacy contact our office and allow 72 hours for refills to be completed.    Today you received the following chemotherapy and/or immunotherapy agents: trastuzumab-hyaluronidase-oysk (HERCEPTIN HYLECTA)   To help prevent nausea and vomiting after your treatment, we encourage you to take your nausea medication as directed.  BELOW ARE SYMPTOMS THAT SHOULD BE REPORTED IMMEDIATELY: *FEVER GREATER THAN 100.4 F (38 C) OR HIGHER *CHILLS OR SWEATING *NAUSEA AND VOMITING THAT IS NOT CONTROLLED WITH YOUR NAUSEA MEDICATION *UNUSUAL SHORTNESS OF BREATH *UNUSUAL BRUISING OR BLEEDING *URINARY PROBLEMS (pain or burning when urinating, or frequent urination) *BOWEL PROBLEMS (unusual diarrhea, constipation, pain near the anus) TENDERNESS IN MOUTH AND THROAT WITH OR WITHOUT PRESENCE OF ULCERS (sore throat, sores in mouth, or a toothache) UNUSUAL RASH, SWELLING OR PAIN  UNUSUAL VAGINAL DISCHARGE OR ITCHING   Items with * indicate a potential emergency and should be followed up as soon as possible or go to the Emergency Department if any problems should occur.  Please show the CHEMOTHERAPY ALERT CARD or  IMMUNOTHERAPY ALERT CARD at check-in to the Emergency Department and triage nurse.  Should you have questions after your visit or need to cancel or reschedule your appointment, please contact [facility]  Dept: [phone]  and follow the prompts.  Office hours are 8:00 a.m. to 4:30 p.m. Monday - Friday. Please note that voicemails left after 4:00 p.m. may not be returned until the following business day.  We are closed weekends and major holidays. You have access to a nurse at all times for urgent questions. Please call the main number to the clinic Dept: [phone] and follow the prompts.   For any non-urgent questions, you may also contact your provider using MyChart. We now offer e-Visits for anyone 33 and older to request care online for non-urgent symptoms. For details visit [email or web address].   Also download the MyChart app! Go to the app store, search "MyChart", open the app, select [facility], and log in with your MyChart username and password.  Due to Covid, a mask is required upon entering the hospital/clinic. If you do not have a mask, one will be given to you upon arrival. For doctor visits, patients may have 1 support person aged 30 or older with them. For treatment visits, patients cannot have anyone with them due to current Covid guidelines and our immunocompromised population.

## 2021-10-14 NOTE — Progress Notes (Signed)
[facility] Spiritual Care Note  Reached Bernedette by phone to follow up on her interest in completing Advance Directives. She is working to navigate pain and energy cycles (sometimes overdoing it and then needing rest), as well as a recent move, and plans to phone in ca two weeks to schedule an AD appointment in our clinic.   [name], [place], [facility] - Inpatient Pager [phone] Voicemail [phone]

## 2021-10-15 ENCOUNTER — Other Ambulatory Visit: Payer: Self-pay | Admitting: Hematology and Oncology

## 2021-10-15 ENCOUNTER — Encounter: Payer: Self-pay | Admitting: Hematology and Oncology

## 2021-10-15 DIAGNOSIS — C7951 Secondary malignant neoplasm of bone: Secondary | ICD-10-CM | POA: Insufficient documentation

## 2021-10-21 ENCOUNTER — Encounter: Payer: Self-pay | Admitting: Hematology and Oncology

## 2021-10-22 ENCOUNTER — Other Ambulatory Visit: Payer: Self-pay | Admitting: *Deleted

## 2021-10-22 DIAGNOSIS — M25651 Stiffness of right hip, not elsewhere classified: Secondary | ICD-10-CM | POA: Diagnosis not present

## 2021-10-22 DIAGNOSIS — M25551 Pain in right hip: Secondary | ICD-10-CM | POA: Diagnosis not present

## 2021-10-22 DIAGNOSIS — C50212 Malignant neoplasm of upper-inner quadrant of left female breast: Secondary | ICD-10-CM

## 2021-10-22 NOTE — Progress Notes (Signed)
Patient Care Team: Pcp, No as PCP - General Rockwell [place], RN as Oncology Nurse Navigator Mauro Kaufmann, RN as Oncology Nurse Navigator  DIAGNOSIS:    ICD-10-CM   1. Malignant neoplasm of upper-inner quadrant of left breast in female, estrogen receptor positive ([place])  C50.212    Z17.0       SUMMARY OF ONCOLOGIC HISTORY: Oncology History  Malignant neoplasm of upper-inner quadrant of left breast in female, estrogen receptor positive ([place])  04/11/2020 Initial Diagnosis   Patient palpated a left breast lump. Mammogram on 03/08/20 showed a 3.0cm mass at the 11 o'clock position with no axillary adenopathy. 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   05/09/2020 Cancer Staging   Staging form: Breast, AJCC 8th Edition - Clinical stage from 05/09/2020: Stage IB (cT2, cN0, cM0, G2, ER+, PR+, HER2+) - Signed by Nicholas Lose, MD on 05/09/2020    06/04/2020 Surgery   Left lumpectomy ([name]): invasive and in situ ductal carcinoma, 3.2cm, clear margins, one left axillary lymph node negative for carcinoma.    02/2021 Relapse/Recurrence   Patient went to orthopedics for right hip pain.  Imaging revealed bone metastasis.  CT CAP: Cortical destruction of the right femoral neck consistent with skeletal metastases at risk for pathologic fracture.  Lucent lesion in the left iliac bone and L3 vertebral body consistent with multifocal skeletal metastases.   06/11/2021 -  Chemotherapy   Patient is on Treatment Plan : BREAST Trastuzumab SQ (Hylecta) q21d       CHIEF COMPLIANT: Follow-up of left breast cancer on Herceptin Hylecta and tamoxifen  INTERVAL HISTORY: Natalie Griffin is a 46 y.o. with above-mentioned history of left breast cancer who underwent a left lumpectomy, completed radiation therapy, and currently on chemotherapy with Hylecta. She presents to the clinic today for follow-up and toxicity check.   ALLERGIES:   is allergic to adhesive [tape], phesgo [pertuz-trastuz-hyaluron-zzxf], tetanus toxoid, succinylcholine, and contrast media [iodinated contrast media].  MEDICATIONS:  Current Outpatient Medications  Medication Sig Dispense Refill   ALPRAZolam (XANAX) 0.5 MG tablet Take 1 tablet (0.5 mg total) by mouth 3 (three) times daily as needed for anxiety. 60 tablet 3   calcium carbonate (OS-CAL) 600 MG TABS tablet Take 600 mg by mouth daily with breakfast.     gabapentin (NEURONTIN) 300 MG capsule Take 1 capsule (300 mg total) by mouth at bedtime. 30 capsule 3   Multiple Vitamins-Minerals (MULTIVITAMIN WITH MINERALS) tablet Take 1 tablet by mouth daily.     oxyCODONE-acetaminophen (PERCOCET) 10-325 MG tablet Take 1 tablet by mouth every 8 (eight) hours as needed for pain. 60 tablet 0   predniSONE (DELTASONE) 50 MG tablet Before CT scan for contrast allergy at 11 hours, 7 hours and 1 hour before scan 3 tablet 0   tamoxifen (NOLVADEX) 20 MG tablet Take 1 tablet (20 mg total) by mouth daily. 90 tablet 3   Vitamin D, Cholecalciferol, 25 MCG (1000 UT) TABS Take 1 tablet by mouth daily. 60 tablet    No current facility-administered medications for this visit.   Facility-Administered Medications Ordered in Other Visits  Medication Dose Route Frequency Provider Last Rate Last Admin   denosumab (XGEVA) injection 120 mg  120 mg Subcutaneous Once Nicholas Lose, MD        PHYSICAL EXAMINATION: ECOG PERFORMANCE STATUS: 1 - Symptomatic but completely ambulatory  Vitals:   10/23/21 1337  BP: 116/71  Pulse: 77  Temp: 98.1 F (36.7 C)  SpO2: 98%   Filed Weights   10/23/21 1337  Weight: 183 lb 6.4 oz (83.2 kg)    BREAST: No palpable masses or nodules in either right or left breasts. No palpable axillary supraclavicular or infraclavicular adenopathy no breast tenderness or nipple discharge. (exam performed in the presence of a chaperone)  LABORATORY DATA:  I have reviewed the data as listed CMP Latest  Ref Rng & Units 10/23/2021 08/04/2021 06/03/2021  Glucose 70 - 99 mg/dL 162(H) 104(H) 105(H)  BUN 6 - 20 mg/dL '17 8 9  ' Creatinine 0.44 - 1.00 mg/dL 0.84 0.84 0.80  Sodium 135 - 145 mmol/L 138 142 139  Potassium 3.5 - 5.1 mmol/L 4.0 4.5 3.7  Chloride 98 - 111 mmol/L 108 108 108  CO2 22 - 32 mmol/L 20(L) 24 22  Calcium 8.9 - 10.3 mg/dL 9.4 9.7 9.2  Total Protein 6.5 - 8.1 g/dL 7.7 8.1 7.6  Total Bilirubin 0.3 - 1.2 mg/dL 0.2(L) 0.3 0.3  Alkaline Phos 38 - 126 U/L 65 117 301(H)  AST 15 - 41 U/L 13(L) 16 19  ALT 0 - 44 U/L '12 18 24    ' Lab Results  Component Value Date   WBC 8.9 10/23/2021   HGB 11.7 (L) 10/23/2021   HCT 35.9 (L) 10/23/2021   MCV 87.8 10/23/2021   PLT 322 10/23/2021   NEUTROABS 6.0 10/23/2021    ASSESSMENT & PLAN:  Malignant neoplasm of upper-inner quadrant of left breast in female, estrogen receptor positive ([place]) /22/2021:Patient palpated a left breast lump. Mammogram on 03/08/20 showed a 3.0cm mass at the 11 o'clock position with no axillary adenopathy. 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  Patient refused adjuvant chemo, adjuvant radiation and adjuvant antiestrogen therapy.   Patient went to orthopedics for right hip pain.  Imaging revealed bone metastasis.  CT CAP: Cortical destruction of the right femoral neck consistent with skeletal metastases at risk for pathologic fracture.  Lucent lesion in the left iliac bone and L3 vertebral body consistent with multifocal skeletal metastases. --------------------------------------------------------------- 04/01/2021: Femoral intramedullary nail  Pathology review: Metastatic breast cancer ER 2% PR 0% HER2 3+ positive   Treatment plan: Tamoxifen 20 mg was prescribed on 04/08/2021 (discontinued by  patient because she felt that the risks and benefits did not support it), subcutaneous Herceptin Perjeta (Phesgo) injection (Phesgo) started 05/14/2021, switched to Herceptin Hylecta 06/11/2021 (Fixed drug eruption, profound diarrhea to Phesgo) Palliative radiation to the iliac bone and hip: 05/20/2021-06/03/2021   Resumed tamoxifen.  She is continuing tamoxifen with manageable side effects.   CT CAP 08/08/21: Numerous new sclerotic bone mets Bone scan 10/03/2021: Stable compared to before.  Numerous bone metastases calvaria, cervical spine, thoracic spine, lumbar spine, LEFT ribs, RIGHT scapula, pelvis, sternum at region of sternomanubrial junction   Pain in the mid back area: Patient wants to hold off on doing radiation at this time.  She is also intolerant of narcotic pain medication.  Therefore she will discontinue dose at this time.  She will take Tylenol as needed.  Bone scan 10/03/2021: Multiple sites of bone uptake consistent with bone metastases (calvarium, spine, ribs, scapula, pelvis, sternum, proximal and distal right femur) no new lesions  Continue with the current treatment plan of Herceptin and tamoxifen.  No orders  of the defined types were placed in this encounter.  The patient has a good understanding of the overall plan. she agrees with it. she will call with any problems that may develop before the next visit here.  Total time spent: 30 mins including face to face time and time spent for planning, charting and coordination of care  Rulon Eisenmenger, MD, MPH 10/23/2021  I, Thana Ates, am acting as scribe for Dr. Nicholas Lose.  I have reviewed the above documentation for accuracy and completeness, and I agree with the above.

## 2021-10-23 ENCOUNTER — Inpatient Hospital Stay: Payer: 59 | Attending: Hematology and Oncology

## 2021-10-23 ENCOUNTER — Other Ambulatory Visit: Payer: 59

## 2021-10-23 ENCOUNTER — Inpatient Hospital Stay (HOSPITAL_BASED_OUTPATIENT_CLINIC_OR_DEPARTMENT_OTHER): Payer: 59 | Admitting: Hematology and Oncology

## 2021-10-23 ENCOUNTER — Other Ambulatory Visit: Payer: Self-pay

## 2021-10-23 ENCOUNTER — Inpatient Hospital Stay: Payer: 59

## 2021-10-23 DIAGNOSIS — Z5112 Encounter for antineoplastic immunotherapy: Secondary | ICD-10-CM | POA: Insufficient documentation

## 2021-10-23 DIAGNOSIS — C50212 Malignant neoplasm of upper-inner quadrant of left female breast: Secondary | ICD-10-CM

## 2021-10-23 DIAGNOSIS — Z170 Estrogen receptor positive status [ER+]: Secondary | ICD-10-CM

## 2021-10-23 DIAGNOSIS — C7951 Secondary malignant neoplasm of bone: Secondary | ICD-10-CM | POA: Insufficient documentation

## 2021-10-23 LAB — CBC WITH DIFFERENTIAL (CANCER CENTER ONLY)
Abs Immature Granulocytes: 0.03 10*3/uL (ref 0.00–0.07)
Basophils Absolute: 0 10*3/uL (ref 0.0–0.1)
Basophils Relative: 0 %
Eosinophils Absolute: 0.2 10*3/uL (ref 0.0–0.5)
Eosinophils Relative: 3 %
HCT: 35.9 % — ABNORMAL LOW (ref 36.0–46.0)
Hemoglobin: 11.7 g/dL — ABNORMAL LOW (ref 12.0–15.0)
Immature Granulocytes: 0 %
Lymphocytes Relative: 23 %
Lymphs Abs: 2 10*3/uL (ref 0.7–4.0)
MCH: 28.6 pg (ref 26.0–34.0)
MCHC: 32.6 g/dL (ref 30.0–36.0)
MCV: 87.8 fL (ref 80.0–100.0)
Monocytes Absolute: 0.6 10*3/uL (ref 0.1–1.0)
Monocytes Relative: 7 %
Neutro Abs: 6 10*3/uL (ref 1.7–7.7)
Neutrophils Relative %: 67 %
Platelet Count: 322 10*3/uL (ref 150–400)
RBC: 4.09 MIL/uL (ref 3.87–5.11)
RDW: 14.6 % (ref 11.5–15.5)
WBC Count: 8.9 10*3/uL (ref 4.0–10.5)
nRBC: 0 % (ref 0.0–0.2)

## 2021-10-23 LAB — CMP (CANCER CENTER ONLY)
ALT: 12 U/L (ref 0–44)
AST: 13 U/L — ABNORMAL LOW (ref 15–41)
Albumin: 4.4 g/dL (ref 3.5–5.0)
Alkaline Phosphatase: 65 U/L (ref 38–126)
Anion gap: 10 (ref 5–15)
BUN: 17 mg/dL (ref 6–20)
CO2: 20 mmol/L — ABNORMAL LOW (ref 22–32)
Calcium: 9.4 mg/dL (ref 8.9–10.3)
Chloride: 108 mmol/L (ref 98–111)
Creatinine: 0.84 mg/dL (ref 0.44–1.00)
GFR, Estimated: >60 mL/min (ref >60)
Glucose, Bld: 162 mg/dL — ABNORMAL HIGH (ref 70–99)
Potassium: 4 mmol/L (ref 3.5–5.1)
Sodium: 138 mmol/L (ref 135–145)
Total Bilirubin: 0.2 mg/dL — ABNORMAL LOW (ref 0.3–1.2)
Total Protein: 7.7 g/dL (ref 6.5–8.1)

## 2021-10-23 MED ORDER — DENOSUMAB 120 MG/1.7ML ~~LOC~~ SOLN
120.0000 mg | Freq: Once | SUBCUTANEOUS | Status: AC
  Administered 2021-10-23: 120 mg via SUBCUTANEOUS
  Filled 2021-10-23: qty 1.7

## 2021-10-23 NOTE — Assessment & Plan Note (Signed)
/  22/2021:Patient palpated a left breast lump. Mammogram on 03/08/20 showed a 3.0cm mass at the 11 o'clock position with no axillary adenopathy. 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 Patient refused adjuvant chemo, adjuvant radiation and adjuvant antiestrogen therapy.  Patient went to orthopedics for right hip pain. Imaging revealed bone metastasis. CT CAP: Cortical destruction of the right femoral neck consistent with skeletal metastases at risk for pathologic fracture. Lucent lesion in the left iliac bone and L3 vertebral body consistent with multifocal skeletal metastases. --------------------------------------------------------------- 04/01/2021: Femoral intramedullary nail Pathology review: Metastatic breast cancer ER 2% PR 0% HER2 3+ positive  Treatment plan: Tamoxifen 20 mgwas prescribed on7/19/2022(discontinued by patient because she felt that the risks and benefits did not support it),subcutaneous Herceptin Perjeta(Phesgo)injection (Phesgo)started8/24/2022, switched to Herceptin Hylecta 06/11/2021 (Fixed drug eruption, profound diarrheato Phesgo) Palliative radiation to the iliac bone and hip:05/20/2021-06/03/2021  Resumed tamoxifen.  She is continuing tamoxifen with manageable side effects.  CT CAP 08/08/21: Numerous new sclerotic bone mets Bone scan 10/03/2021: Stable compared to before.  Numerous bone metastases calvaria, cervical spine, thoracic spine, lumbar spine, LEFT ribs, RIGHT scapula, pelvis, sternum at region of sternomanubrial junction  Pain in the mid back area: Patient wants to hold off on doing radiation at this time. Bone scan 10/03/2021: Multiple sites of bone uptake consistent with bone metastases (calvarium, spine, ribs,  scapula, pelvis, sternum, proximal and distal right femur) no new lesions

## 2021-10-23 NOTE — Patient Instructions (Signed)
Denosumab injection What is this medication? DENOSUMAB (den oh sue mab) slows bone breakdown. Prolia is used to treat osteoporosis in women after menopause and in men, and in people who are taking corticosteroids for 6 months or more. Delton See is used to treat a high calcium level due to cancer and to prevent bone fractures and other bone problems caused by multiple myeloma or cancer bone metastases. Delton See is also used to treat giant cell tumor of the bone. This medicine may be used for other purposes; ask your health care provider or pharmacist if you have questions. COMMON BRAND NAME(S): Prolia, XGEVA What should I tell my care team before I take this medication? They need to know if you have any of these conditions: dental disease having surgery or tooth extraction infection kidney disease low levels of calcium or Vitamin D in the blood malnutrition on hemodialysis skin conditions or sensitivity thyroid or parathyroid disease an unusual reaction to denosumab, other medicines, foods, dyes, or preservatives pregnant or trying to get pregnant breast-feeding How should I use this medication? This medicine is for injection under the skin. It is given by a health care professional in a hospital or clinic setting. A special MedGuide will be given to you before each treatment. Be sure to read this information carefully each time. For Prolia, talk to your pediatrician regarding the use of this medicine in children. Special care may be needed. For Delton See, talk to your pediatrician regarding the use of this medicine in children. While this drug may be prescribed for children as young as 13 years for selected conditions, precautions do apply. Overdosage: If you think you have taken too much of this medicine contact a poison control center or emergency room at once. NOTE: This medicine is only for you. Do not share this medicine with others. What if I miss a dose? It is important not to miss your dose.  Call your doctor or health care professional if you are unable to keep an appointment. What may interact with this medication? Do not take this medicine with any of the following medications: other medicines containing denosumab This medicine may also interact with the following medications: medicines that lower your chance of fighting infection steroid medicines like prednisone or cortisone This list may not describe all possible interactions. Give your health care provider a list of all the medicines, herbs, non-prescription drugs, or dietary supplements you use. Also tell them if you smoke, drink alcohol, or use illegal drugs. Some items may interact with your medicine. What should I watch for while using this medication? Visit your doctor or health care professional for regular checks on your progress. Your doctor or health care professional may order blood tests and other tests to see how you are doing. Call your doctor or health care professional for advice if you get a fever, chills or sore throat, or other symptoms of a cold or flu. Do not treat yourself. This drug may decrease your body's ability to fight infection. Try to avoid being around people who are sick. You should make sure you get enough calcium and vitamin D while you are taking this medicine, unless your doctor tells you not to. Discuss the foods you eat and the vitamins you take with your health care professional. See your dentist regularly. Brush and floss your teeth as directed. Before you have any dental work done, tell your dentist you are receiving this medicine. Do not become pregnant while taking this medicine or for 5 months after  stopping it. Talk with your doctor or health care professional about your birth control options while taking this medicine. Women should inform their doctor if they wish to become pregnant or think they might be pregnant. There is a potential for serious side effects to an unborn child. Talk to  your health care professional or pharmacist for more information. What side effects may I notice from receiving this medication? Side effects that you should report to your doctor or health care professional as soon as possible: allergic reactions like skin rash, itching or hives, swelling of the face, lips, or tongue bone pain breathing problems dizziness jaw pain, especially after dental work redness, blistering, peeling of the skin signs and symptoms of infection like fever or chills; cough; sore throat; pain or trouble passing urine signs of low calcium like fast heartbeat, muscle cramps or muscle pain; pain, tingling, numbness in the hands or feet; seizures unusual bleeding or bruising unusually weak or tired Side effects that usually do not require medical attention (report to your doctor or health care professional if they continue or are bothersome): constipation diarrhea headache joint pain loss of appetite muscle pain runny nose tiredness upset stomach This list may not describe all possible side effects. Call your doctor for medical advice about side effects. You may report side effects to FDA at 1-800-FDA-1088. Where should I keep my medication? This medicine is only given in a clinic, doctor's office, or other health care setting and will not be stored at home. NOTE: This sheet is a summary. It may not cover all possible information. If you have questions about this medicine, talk to your doctor, pharmacist, or health care provider.  2022 Elsevier/Gold Standard (2018-01-14 00:00:00)

## 2021-10-29 ENCOUNTER — Other Ambulatory Visit (HOSPITAL_COMMUNITY): Payer: Self-pay

## 2021-10-29 ENCOUNTER — Encounter (HOSPITAL_COMMUNITY): Payer: Self-pay

## 2021-10-29 ENCOUNTER — Encounter: Payer: Self-pay | Admitting: Hematology and Oncology

## 2021-10-31 ENCOUNTER — Encounter (HOSPITAL_COMMUNITY): Payer: Self-pay

## 2021-10-31 ENCOUNTER — Inpatient Hospital Stay (HOSPITAL_COMMUNITY): Admission: RE | Admit: 2021-10-31 | Payer: 59 | Source: Ambulatory Visit

## 2021-11-03 ENCOUNTER — Encounter: Payer: Self-pay | Admitting: Hematology and Oncology

## 2021-11-04 ENCOUNTER — Other Ambulatory Visit: Payer: Self-pay

## 2021-11-04 ENCOUNTER — Other Ambulatory Visit: Payer: Self-pay | Admitting: *Deleted

## 2021-11-04 ENCOUNTER — Inpatient Hospital Stay: Payer: 59

## 2021-11-04 VITALS — BP 121/92 | HR 69 | Temp 98.4°F | Resp 18 | Wt 184.0 lb

## 2021-11-04 DIAGNOSIS — Z170 Estrogen receptor positive status [ER+]: Secondary | ICD-10-CM

## 2021-11-04 DIAGNOSIS — C50212 Malignant neoplasm of upper-inner quadrant of left female breast: Secondary | ICD-10-CM

## 2021-11-04 DIAGNOSIS — Z5112 Encounter for antineoplastic immunotherapy: Secondary | ICD-10-CM | POA: Diagnosis not present

## 2021-11-04 DIAGNOSIS — C7951 Secondary malignant neoplasm of bone: Secondary | ICD-10-CM | POA: Diagnosis not present

## 2021-11-04 LAB — CMP (CANCER CENTER ONLY)
ALT: 18 U/L (ref 0–44)
AST: 16 U/L (ref 15–41)
Albumin: 3.7 g/dL (ref 3.5–5.0)
Alkaline Phosphatase: 55 U/L (ref 38–126)
Anion gap: 8 (ref 5–15)
BUN: 17 mg/dL (ref 6–20)
CO2: 22 mmol/L (ref 22–32)
Calcium: 9.5 mg/dL (ref 8.9–10.3)
Chloride: 105 mmol/L (ref 98–111)
Creatinine: 0.72 mg/dL (ref 0.44–1.00)
GFR, Estimated: >60 mL/min (ref >60)
Glucose, Bld: 98 mg/dL (ref 70–99)
Potassium: 4.1 mmol/L (ref 3.5–5.1)
Sodium: 135 mmol/L (ref 135–145)
Total Bilirubin: <0.1 mg/dL — ABNORMAL LOW (ref 0.3–1.2)
Total Protein: 7.2 g/dL (ref 6.5–8.1)

## 2021-11-04 LAB — CBC WITH DIFFERENTIAL (CANCER CENTER ONLY)
Abs Immature Granulocytes: 0.02 10*3/uL (ref 0.00–0.07)
Basophils Absolute: 0.1 10*3/uL (ref 0.0–0.1)
Basophils Relative: 1 %
Eosinophils Absolute: 0.4 10*3/uL (ref 0.0–0.5)
Eosinophils Relative: 4 %
HCT: 33.6 % — ABNORMAL LOW (ref 36.0–46.0)
Hemoglobin: 10.9 g/dL — ABNORMAL LOW (ref 12.0–15.0)
Immature Granulocytes: 0 %
Lymphocytes Relative: 31 %
Lymphs Abs: 2.8 10*3/uL (ref 0.7–4.0)
MCH: 28.4 pg (ref 26.0–34.0)
MCHC: 32.4 g/dL (ref 30.0–36.0)
MCV: 87.5 fL (ref 80.0–100.0)
Monocytes Absolute: 0.6 10*3/uL (ref 0.1–1.0)
Monocytes Relative: 7 %
Neutro Abs: 5 10*3/uL (ref 1.7–7.7)
Neutrophils Relative %: 57 %
Platelet Count: 330 10*3/uL (ref 150–400)
RBC: 3.84 MIL/uL — ABNORMAL LOW (ref 3.87–5.11)
RDW: 14.4 % (ref 11.5–15.5)
WBC Count: 8.8 10*3/uL (ref 4.0–10.5)
nRBC: 0 % (ref 0.0–0.2)

## 2021-11-04 LAB — SAMPLE TO BLOOD BANK

## 2021-11-04 MED ORDER — TRASTUZUMAB-HYALURONIDASE-OYSK 600-10000 MG-UNT/5ML ~~LOC~~ SOLN
600.0000 mg | Freq: Once | SUBCUTANEOUS | Status: AC
  Administered 2021-11-04: 600 mg via SUBCUTANEOUS
  Filled 2021-11-04: qty 5

## 2021-11-04 NOTE — Patient Instructions (Signed)
[facility] ONCOLOGY   Discharge Instructions: Thank you for choosing [facility] to provide your oncology and hematology care.   If you have a lab appointment with the [facility], please go directly to the [facility] and check in at the registration area.   Wear comfortable clothing and clothing appropriate for easy access to any Portacath or PICC line.   We strive to give you quality time with your provider. You may need to reschedule your appointment if you arrive late (15 or more minutes).  Arriving late affects you and other patients whose appointments are after yours.  Also, if you miss three or more appointments without notifying the office, you may be dismissed from the clinic at the providers discretion.      For prescription refill requests, have your pharmacy contact our office and allow 72 hours for refills to be completed.    Today you received the following chemotherapy and/or immunotherapy agents: trastuzumab-hyaluronidase-oysk (HERCEPTIN HYLECTA)   To help prevent nausea and vomiting after your treatment, we encourage you to take your nausea medication as directed.  BELOW ARE SYMPTOMS THAT SHOULD BE REPORTED IMMEDIATELY: *FEVER GREATER THAN 100.4 F (38 C) OR HIGHER *CHILLS OR SWEATING *NAUSEA AND VOMITING THAT IS NOT CONTROLLED WITH YOUR NAUSEA MEDICATION *UNUSUAL SHORTNESS OF BREATH *UNUSUAL BRUISING OR BLEEDING *URINARY PROBLEMS (pain or burning when urinating, or frequent urination) *BOWEL PROBLEMS (unusual diarrhea, constipation, pain near the anus) TENDERNESS IN MOUTH AND THROAT WITH OR WITHOUT PRESENCE OF ULCERS (sore throat, sores in mouth, or a toothache) UNUSUAL RASH, SWELLING OR PAIN  UNUSUAL VAGINAL DISCHARGE OR ITCHING   Items with * indicate a potential emergency and should be followed up as soon as possible or go to the Emergency Department if any problems should occur.  Please show the CHEMOTHERAPY ALERT CARD or  IMMUNOTHERAPY ALERT CARD at check-in to the Emergency Department and triage nurse.  Should you have questions after your visit or need to cancel or reschedule your appointment, please contact [facility]  Dept: [phone]  and follow the prompts.  Office hours are 8:00 a.m. to 4:30 p.m. Monday - Friday. Please note that voicemails left after 4:00 p.m. may not be returned until the following business day.  We are closed weekends and major holidays. You have access to a nurse at all times for urgent questions. Please call the main number to the clinic Dept: [phone] and follow the prompts.   For any non-urgent questions, you may also contact your provider using MyChart. We now offer e-Visits for anyone 10 and older to request care online for non-urgent symptoms. For details visit [email or web address].   Also download the MyChart app! Go to the app store, search "MyChart", open the app, select [facility], and log in with your MyChart username and password.  Due to Covid, a mask is required upon entering the hospital/clinic. If you do not have a mask, one will be given to you upon arrival. For doctor visits, patients may have 1 support person aged 7 or older with them. For treatment visits, patients cannot have anyone with them due to current Covid guidelines and our immunocompromised population.

## 2021-11-08 ENCOUNTER — Encounter: Payer: Self-pay | Admitting: Hematology and Oncology

## 2021-11-10 ENCOUNTER — Telehealth: Payer: Self-pay

## 2021-11-10 NOTE — Telephone Encounter (Signed)
Attempted to call pt to offer appt with NP for further eval of sx. LVM for pt to retun call for appt.

## 2021-11-13 ENCOUNTER — Encounter: Payer: Self-pay | Admitting: Hematology and Oncology

## 2021-11-13 ENCOUNTER — Encounter: Payer: 59 | Admitting: Adult Health

## 2021-11-13 ENCOUNTER — Other Ambulatory Visit: Payer: Self-pay

## 2021-11-13 ENCOUNTER — Ambulatory Visit (HOSPITAL_COMMUNITY)
Admission: RE | Admit: 2021-11-13 | Discharge: 2021-11-13 | Disposition: A | Discharge status: Home / Self Care | Payer: 59 | Source: Ambulatory Visit | Attending: Hematology and Oncology | Admitting: Hematology and Oncology

## 2021-11-13 DIAGNOSIS — Z5181 Encounter for therapeutic drug level monitoring: Secondary | ICD-10-CM | POA: Insufficient documentation

## 2021-11-13 DIAGNOSIS — Z79899 Other long term (current) drug therapy: Secondary | ICD-10-CM | POA: Insufficient documentation

## 2021-11-13 DIAGNOSIS — Z0189 Encounter for other specified special examinations: Secondary | ICD-10-CM

## 2021-11-13 LAB — ECHOCARDIOGRAM COMPLETE
AV Mean grad: 4 mmHg
AV Peak grad: 7.7 mmHg
Ao pk vel: 1.39 m/s
Area-P 1/2: 3.23 cm2
Calc EF: 65.6 %
S' Lateral: 2.8 cm
Single Plane A2C EF: 69.1 %
Single Plane A4C EF: 63.6 %

## 2021-11-24 NOTE — Progress Notes (Signed)
? ?Patient Care Team: ?Pcp, No as PCP - General ?Rockwell Germany, RN as Oncology Nurse Navigator ?Mauro Kaufmann, RN as Oncology Nurse Navigator ? ?DIAGNOSIS:  ?  ICD-10-CM   ?1. Malignant neoplasm of upper-inner quadrant of left breast in female, estrogen receptor positive ([place])  C50.212   ? Z17.0   ?  ? ? ?SUMMARY OF ONCOLOGIC HISTORY: ?Oncology History  ?Malignant neoplasm of upper-inner quadrant of left breast in female, estrogen receptor positive ([place])  ?04/11/2020 Initial Diagnosis  ? Patient palpated a left breast lump. Mammogram on 03/08/20 showed a 3.0cm mass at the 11 o'clock position with no axillary adenopathy. 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 ?  ?05/09/2020 Cancer Staging  ? Staging form: Breast, AJCC 8th Edition ?- Clinical stage from 05/09/2020: Stage IB (cT2, cN0, cM0, G2, ER+, PR+, HER2+) - Signed by Nicholas Lose, MD on 05/09/2020 ? ?  ?06/04/2020 Surgery  ? Left lumpectomy ([name]): invasive and in situ ductal carcinoma, 3.2cm, clear margins, one left axillary lymph node negative for carcinoma.  ?  ?02/2021 Relapse/Recurrence  ? Patient went to orthopedics for right hip pain.  Imaging revealed bone metastasis.  CT CAP: Cortical destruction of the right femoral neck consistent with skeletal metastases at risk for pathologic fracture.  Lucent lesion in the left iliac bone and L3 vertebral body consistent with multifocal skeletal metastases. ?  ?06/11/2021 -  Chemotherapy  ? Patient is on Treatment Plan : BREAST Trastuzumab SQ (Hylecta) q21d  ?   ? ? ?CHIEF COMPLIANT: Follow-up of left breast cancer on Herceptin Hylecta and tamoxifen ? ?INTERVAL HISTORY: Natalie Griffin is a 46 y.o. with above-mentioned history of left breast cancer who underwent a left lumpectomy, completed radiation therapy, and currently on chemotherapy with Hylecta. She presents to the clinic today for treatment.  She had a fairly substantial  bruise after the last injection of Herceptin.  It has finally gotten better.  She is contemplating on repairing her own [place] tail mushrooms. ? ? ?ALLERGIES:  is allergic to adhesive [tape], phesgo [pertuz-trastuz-hyaluron-zzxf], tetanus toxoid, succinylcholine, and contrast media [iodinated contrast media]. ? ?MEDICATIONS:  ?Current Outpatient Medications  ?Medication Sig Dispense Refill  ? ALPRAZolam (XANAX) 0.5 MG tablet Take 1 tablet (0.5 mg total) by mouth 3 (three) times daily as needed for anxiety. 60 tablet 3  ? calcium carbonate (OS-CAL) 600 MG TABS tablet Take 600 mg by mouth daily with breakfast.    ? gabapentin (NEURONTIN) 300 MG capsule Take 1 capsule (300 mg total) by mouth at bedtime. 30 capsule 3  ? Multiple Vitamins-Minerals (MULTIVITAMIN WITH MINERALS) tablet Take 1 tablet by mouth daily.    ? oxyCODONE-acetaminophen (PERCOCET) 10-325 MG tablet Take 1 tablet by mouth every 8 (eight) hours as needed for pain. 60 tablet 0  ? predniSONE (DELTASONE) 50 MG tablet Before CT scan for contrast allergy at 11 hours, 7 hours and 1 hour before scan 3 tablet 0  ? tamoxifen (NOLVADEX) 20 MG tablet Take 1 tablet (20 mg total) by mouth daily. 90 tablet 3  ? Vitamin D, Cholecalciferol, 25 MCG (1000 UT) TABS Take 1 tablet by mouth daily. 60 tablet   ? ?No current facility-administered medications for this visit.  ? ? ?PHYSICAL EXAMINATION: ?ECOG PERFORMANCE STATUS: 1 - Symptomatic but completely ambulatory ? ?Vitals:  ? 11/25/21 1446  ?BP: 135/80  ?Pulse: 87  ?Resp: 18  ?Temp: 97.7 ?F (36.5 ?C)  ?SpO2:  100%  ? ?Filed Weights  ? 11/25/21 1446  ?Weight: 183 lb 4.8 oz (83.1 kg)  ? ? ?LABORATORY DATA:  ?I have reviewed the data as listed ?CMP Latest Ref Rng & Units 11/25/2021 11/04/2021 10/23/2021  ?Glucose 70 - 99 mg/dL 87 98 162(H)  ?BUN 6 - 20 mg/dL _0 ?Creatinine 0.44 - 1.00 mg/dL 0.76 0.72 0.84  ?Sodium 135 - 145 mmol/L 139 135 138  ?Potassium 3.5 - 5.1 mmol/L 3.3(L) 4.1 4.0  ?Chloride 98 - 111 mmol/L 109 105 108   ?CO2 22 - 32 mmol/L 24 22 20(L)  ?Calcium 8.9 - 10.3 mg/dL 8.8(L) 9.5 9.4  ?Total Protein 6.5 - 8.1 g/dL 7.2 7.2 7.7  ?Total Bilirubin 0.3 - 1.2 mg/dL 0.2(L) <0.1(L) 0.2(L)  ?Alkaline Phos 38 - 126 U/L 47 55 65  ?AST 15 - 41 U/L 20 16 13(L)  ?ALT 0 - 44 U/L _1 ? ? ?Lab Results  ?Component Value Date  ? WBC 10.7 (H) 11/25/2021  ? HGB 10.4 (L) 11/25/2021  ? HCT 32.5 (L) 11/25/2021  ? MCV 89.3 11/25/2021  ? PLT 306 11/25/2021  ? NEUTROABS 7.0 11/25/2021  ? ? ?ASSESSMENT & PLAN:  ?Malignant neoplasm of upper-inner quadrant of left breast in female, estrogen receptor positive ([place]) ?22/2021:Patient palpated a left breast lump. Mammogram on 03/08/20 showed a 3.0cm mass at the 11 o'clock position with no axillary adenopathy. 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 ?  ?06/04/2020:Left lumpectomy ([name]): invasive and in situ ductal carcinoma, 3.2cm, clear margins, one left axillary lymph node negative for carcinoma.  ?Patient refused adjuvant chemo, adjuvant radiation and adjuvant antiestrogen therapy. ?  ?Patient went to orthopedics for right hip pain.  Imaging revealed bone metastasis.  CT CAP: Cortical destruction of the right femoral neck consistent with skeletal metastases at risk for pathologic fracture.  Lucent lesion in the left iliac bone and L3 vertebral body consistent with multifocal skeletal metastases. ?--------------------------------------------------------------- ?04/01/2021: Femoral intramedullary nail  ?Pathology review: Metastatic breast cancer ER 2% PR 0% HER2 3+ positive ?  ?Treatment plan: Tamoxifen 20 mg was prescribed on 04/08/2021 (discontinued by patient because she felt that the risks and benefits did not support it), subcutaneous Herceptin Perjeta (Phesgo) injection (Phesgo) started 05/14/2021, switched to Herceptin Hylecta 06/11/2021 (Fixed drug eruption, profound diarrhea to Phesgo) ?Palliative radiation to the  iliac bone and hip: 05/20/2021-06/03/2021 ?  ?Resumed tamoxifen.  She is continuing tamoxifen with manageable side effects. ?   ?Bone scan 10/03/2021: Multiple sites of bone uptake consistent with bone metastases (calvarium, spine, ribs, scapula, pelvis, sternum, proximal and distal right femur) no new lesions ?  ?Patient is planning on going her on [place] tail mushrooms. ?Continue with the current treatment plan of Herceptin and tamoxifen. ? ? ? ?No orders of the defined types were placed in this encounter. ? ?The patient has a good understanding of the overall plan. she agrees with it. she will call with any problems that may develop before the next visit here. ? ?Total time spent: 30 mins including face to face time and time spent for planning, charting and coordination of care ? ?Rulon Eisenmenger, MD, MPH ?11/25/2021 ? ?I, Thana Ates, am acting as scribe for Dr. Nicholas Lose. ? ?I have reviewed the above documentation for accuracy and completeness, and I agree with the above. ? ? ? ? ? ? ?

## 2021-11-25 ENCOUNTER — Inpatient Hospital Stay: Payer: 59 | Attending: Hematology and Oncology | Admitting: Hematology and Oncology

## 2021-11-25 ENCOUNTER — Other Ambulatory Visit: Payer: 59

## 2021-11-25 ENCOUNTER — Inpatient Hospital Stay: Payer: 59

## 2021-11-25 ENCOUNTER — Other Ambulatory Visit: Payer: Self-pay

## 2021-11-25 DIAGNOSIS — C50212 Malignant neoplasm of upper-inner quadrant of left female breast: Secondary | ICD-10-CM | POA: Diagnosis not present

## 2021-11-25 DIAGNOSIS — C7951 Secondary malignant neoplasm of bone: Secondary | ICD-10-CM | POA: Diagnosis not present

## 2021-11-25 DIAGNOSIS — Z170 Estrogen receptor positive status [ER+]: Secondary | ICD-10-CM

## 2021-11-25 DIAGNOSIS — Z5112 Encounter for antineoplastic immunotherapy: Secondary | ICD-10-CM | POA: Diagnosis not present

## 2021-11-25 LAB — CMP (CANCER CENTER ONLY)
ALT: 20 U/L (ref 0–44)
AST: 20 U/L (ref 15–41)
Albumin: 4.2 g/dL (ref 3.5–5.0)
Alkaline Phosphatase: 47 U/L (ref 38–126)
Anion gap: 6 (ref 5–15)
BUN: 8 mg/dL (ref 6–20)
CO2: 24 mmol/L (ref 22–32)
Calcium: 8.8 mg/dL — ABNORMAL LOW (ref 8.9–10.3)
Chloride: 109 mmol/L (ref 98–111)
Creatinine: 0.76 mg/dL (ref 0.44–1.00)
GFR, Estimated: >60 mL/min (ref >60)
Glucose, Bld: 87 mg/dL (ref 70–99)
Potassium: 3.3 mmol/L — ABNORMAL LOW (ref 3.5–5.1)
Sodium: 139 mmol/L (ref 135–145)
Total Bilirubin: 0.2 mg/dL — ABNORMAL LOW (ref 0.3–1.2)
Total Protein: 7.2 g/dL (ref 6.5–8.1)

## 2021-11-25 LAB — CBC WITH DIFFERENTIAL (CANCER CENTER ONLY)
Abs Immature Granulocytes: 0.05 10*3/uL (ref 0.00–0.07)
Basophils Absolute: 0 10*3/uL (ref 0.0–0.1)
Basophils Relative: 0 %
Eosinophils Absolute: 0.4 10*3/uL (ref 0.0–0.5)
Eosinophils Relative: 3 %
HCT: 32.5 % — ABNORMAL LOW (ref 36.0–46.0)
Hemoglobin: 10.4 g/dL — ABNORMAL LOW (ref 12.0–15.0)
Immature Granulocytes: 1 %
Lymphocytes Relative: 24 %
Lymphs Abs: 2.6 10*3/uL (ref 0.7–4.0)
MCH: 28.6 pg (ref 26.0–34.0)
MCHC: 32 g/dL (ref 30.0–36.0)
MCV: 89.3 fL (ref 80.0–100.0)
Monocytes Absolute: 0.7 10*3/uL (ref 0.1–1.0)
Monocytes Relative: 6 %
Neutro Abs: 7 10*3/uL (ref 1.7–7.7)
Neutrophils Relative %: 66 %
Platelet Count: 306 10*3/uL (ref 150–400)
RBC: 3.64 MIL/uL — ABNORMAL LOW (ref 3.87–5.11)
RDW: 14.1 % (ref 11.5–15.5)
WBC Count: 10.7 10*3/uL — ABNORMAL HIGH (ref 4.0–10.5)
nRBC: 0 % (ref 0.0–0.2)

## 2021-11-25 MED ORDER — TRASTUZUMAB-HYALURONIDASE-OYSK 600-10000 MG-UNT/5ML ~~LOC~~ SOLN
600.0000 mg | Freq: Once | SUBCUTANEOUS | Status: AC
  Administered 2021-11-25: 600 mg via SUBCUTANEOUS
  Filled 2021-11-25: qty 5

## 2021-11-25 NOTE — Patient Instructions (Signed)
[facility]COL[facility]  Discharge Instructions: Thank you for choosing [facility] to provide your oncology and hematology care.   If you have a lab appointment with the Cancer Center, please go directly to the Cancer Center and check in at the registration area.   Wear comfortable clothing and clothing appropriate for easy access to any Portacath or PICC line.   We strive to give you quality time with your provider. You may need to reschedule your appointment if you arrive late (15 or more minutes).  Arriving late affects you and other patients whose appointments are after yours.  Also, if you miss three or more appointments without notifying the office, you may be dismissed from the clinic at the provider's discretion.      For prescription refill requests, have your pharmacy contact our office and allow 72 hours for refills to be completed.    Today you received the following chemotherapy and/or immunotherapy agents: Herceptin Hylecta.     To help prevent nausea and vomiting after your treatment, we encourage you to take your nausea medication as directed.  BELOW ARE SYMPTOMS THAT SHOULD BE REPORTED IMMEDIATELY: . *FEVER GREATER THAN 100.4 F (38 C) OR HIGHER . *CHILLS OR SWEATING . *NAUSEA AND VOMITING THAT IS NOT CONTROLLED WITH YOUR NAUSEA MEDICATION . *UNUSUAL SHORTNESS OF BREATH . *UNUSUAL BRUISING OR BLEEDING . *URINARY PROBLEMS (pain or burning when urinating, or frequent urination) . *BOWEL PROBLEMS (unusual diarrhea, constipation, pain near the anus) . TENDERNESS IN MOUTH AND THROAT WITH OR WITHOUT PRESENCE OF ULCERS (sore throat, sores in mouth, or a toothache) . UNUSUAL RASH, SWELLING OR PAIN  . UNUSUAL VAGINAL DISCHARGE OR ITCHING   Items with * indicate a potential emergency and should be followed up as soon as possible or go to the Emergency Department if any problems should occur.  Please show the CHEMOTHERAPY ALERT CARD or  IMMUNOTHERAPY ALERT CARD at check-in to the Emergency Department and triage nurse.  Should you have questions after your visit or need to cancel or reschedule your appointment, please contact [facility]  Dept: [phone]  and follow the prompts.  Office hours are 8:00 a.m. to 4:30 p.m. Monday - Friday. Please note that voicemails left after 4:00 p.m. may not be returned until the following business day.  We are closed weekends and major holidays. You have access to a nurse at all times for urgent questions. Please call the main number to the clinic Dept: [phone] and follow the prompts.   For any non-urgent questions, you may also contact your provider using MyChart. We now offer e-Visits for anyone 18 and older to request care online for non-urgent symptoms. For details visit [email or web address].   Also download the MyChart app! Go to the app store, search "MyChart", open the app, select , and log in with your MyChart username and password.  Due to Covid, a mask is required upon entering the hospital/clinic. If you do not have a mask, one will be given to you upon arrival. For doctor visits, patients may have 1 support person aged 18 or older with them. For treatment visits, patients cannot have anyone with them due to current Covid guidelines and our immunocompromised population.   

## 2021-11-25 NOTE — Assessment & Plan Note (Signed)
22/2021:Patient palpated a left breast lump. Mammogram on 03/08/20 showed a 3.0cm mass at the 11 o'clock position with no axillary adenopathy. 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 ?? ?06/04/2020:Left lumpectomy ([name]): invasive and in situ ductal carcinoma, 3.2cm, clear margins, one left axillary lymph node negative for carcinoma.? ?Patient refused adjuvant chemo, adjuvant radiation and adjuvant antiestrogen therapy. ?? ?Patient went to orthopedics for right hip pain. ?Imaging revealed bone metastasis. ?CT CAP: Cortical destruction of the right femoral neck consistent with skeletal metastases at risk for pathologic fracture. ?Lucent lesion in the left iliac bone and L3 vertebral body consistent with multifocal skeletal metastases. ?--------------------------------------------------------------- ?04/01/2021: Femoral intramedullary nail? ?Pathology review: Metastatic breast cancer ER 2% PR 0% HER2 3+ positive ?? ?Treatment plan: Tamoxifen 20 mg?was prescribed on?04/08/2021?(discontinued by patient because she felt that the risks and benefits did not support it),?subcutaneous Herceptin Perjeta?(Phesgo)?injection (Phesgo)?started?05/14/2021, switched to Herceptin Hylecta 06/11/2021 (Fixed drug eruption, profound diarrhea?to Phesgo) ?Palliative radiation to the iliac bone and hip:?05/20/2021-06/03/2021 ?? ?Resumed tamoxifen. ?She is continuing tamoxifen with manageable side effects. ?? ?CT CAP 08/08/21: Numerous new sclerotic bone mets ?Bone scan 10/03/2021: Stable compared to before. ?Numerous bone metastases calvaria, cervical spine, thoracic spine, lumbar spine, LEFT ribs, RIGHT scapula, pelvis, sternum at region of sternomanubrial junction ?? ?Pain in the mid back area: Patient wants to hold off on doing radiation at this time.  She is also intolerant of narcotic pain medication.  Therefore she will discontinue dose at this time.  She will  take Tylenol as needed. ?? ?Bone scan 10/03/2021: Multiple sites of bone uptake consistent with bone metastases (calvarium, spine, ribs, scapula, pelvis, sternum, proximal and distal right femur) no new lesions ?? ?Continue with the current treatment plan of Herceptin and tamoxifen. ?

## 2021-11-26 ENCOUNTER — Encounter: Payer: Self-pay | Admitting: Adult Health

## 2021-11-30 ENCOUNTER — Other Ambulatory Visit: Payer: Self-pay | Admitting: Hematology and Oncology

## 2021-12-01 MED ORDER — OXYCODONE-ACETAMINOPHEN 10-325 MG PO TABS: 1.0000 | ORAL_TABLET | Freq: Three times a day (TID) | ORAL | 0 refills | Status: DC | PRN

## 2021-12-04 ENCOUNTER — Other Ambulatory Visit (HOSPITAL_COMMUNITY): Payer: Self-pay

## 2021-12-04 ENCOUNTER — Other Ambulatory Visit: Payer: Self-pay | Admitting: Adult Health

## 2021-12-04 ENCOUNTER — Other Ambulatory Visit: Payer: Self-pay | Admitting: Hematology and Oncology

## 2021-12-04 ENCOUNTER — Encounter: Payer: Self-pay | Admitting: Hematology and Oncology

## 2021-12-04 MED ORDER — OXYCODONE-ACETAMINOPHEN 10-325 MG PO TABS: 1.0000 | ORAL_TABLET | Freq: Three times a day (TID) | ORAL | 0 refills | Status: DC | PRN

## 2021-12-04 MED ORDER — OXYCODONE-ACETAMINOPHEN 10-325 MG PO TABS
1.0000 | ORAL_TABLET | Freq: Three times a day (TID) | ORAL | 0 refills | Status: DC | PRN
  Filled 2021-12-04 – 2021-12-05 (×2): qty 60, 20d supply, fill #0

## 2021-12-04 NOTE — Progress Notes (Signed)
Received notification from [facility] that patient could not have Percocet filled at private pharmacy due to availability issues.  I e-scribed Percocet to [facility] on behalf of Dr. Lindi Adie for patient continuity of care.   ? ?PMP Aware reviewed.   ? ? ?

## 2021-12-05 ENCOUNTER — Other Ambulatory Visit (HOSPITAL_COMMUNITY): Payer: Self-pay

## 2021-12-08 DIAGNOSIS — C7951 Secondary malignant neoplasm of bone: Secondary | ICD-10-CM | POA: Insufficient documentation

## 2021-12-12 DIAGNOSIS — M79651 Pain in right thigh: Secondary | ICD-10-CM | POA: Diagnosis not present

## 2021-12-12 DIAGNOSIS — M25651 Stiffness of right hip, not elsewhere classified: Secondary | ICD-10-CM | POA: Diagnosis not present

## 2021-12-12 DIAGNOSIS — M25551 Pain in right hip: Secondary | ICD-10-CM | POA: Diagnosis not present

## 2021-12-16 ENCOUNTER — Inpatient Hospital Stay: Payer: 59

## 2021-12-17 ENCOUNTER — Other Ambulatory Visit (HOSPITAL_COMMUNITY): Payer: Self-pay

## 2021-12-17 ENCOUNTER — Inpatient Hospital Stay: Payer: 59

## 2021-12-17 ENCOUNTER — Other Ambulatory Visit: Payer: Self-pay

## 2021-12-17 VITALS — BP 161/83 | HR 94 | Temp 98.2°F | Resp 18 | Wt 184.5 lb

## 2021-12-17 DIAGNOSIS — Z6830 Body mass index (BMI) 30.0-30.9, adult: Secondary | ICD-10-CM | POA: Diagnosis not present

## 2021-12-17 DIAGNOSIS — Z01419 Encounter for gynecological examination (general) (routine) without abnormal findings: Secondary | ICD-10-CM | POA: Diagnosis not present

## 2021-12-17 DIAGNOSIS — C7951 Secondary malignant neoplasm of bone: Secondary | ICD-10-CM | POA: Diagnosis not present

## 2021-12-17 DIAGNOSIS — C50912 Malignant neoplasm of unspecified site of left female breast: Secondary | ICD-10-CM | POA: Diagnosis not present

## 2021-12-17 DIAGNOSIS — Z124 Encounter for screening for malignant neoplasm of cervix: Secondary | ICD-10-CM | POA: Diagnosis not present

## 2021-12-17 DIAGNOSIS — Z170 Estrogen receptor positive status [ER+]: Secondary | ICD-10-CM

## 2021-12-17 DIAGNOSIS — Z8052 Family history of malignant neoplasm of bladder: Secondary | ICD-10-CM | POA: Diagnosis not present

## 2021-12-17 DIAGNOSIS — N959 Unspecified menopausal and perimenopausal disorder: Secondary | ICD-10-CM | POA: Diagnosis not present

## 2021-12-17 DIAGNOSIS — N760 Acute vaginitis: Secondary | ICD-10-CM | POA: Diagnosis not present

## 2021-12-17 DIAGNOSIS — C50212 Malignant neoplasm of upper-inner quadrant of left female breast: Secondary | ICD-10-CM | POA: Diagnosis not present

## 2021-12-17 DIAGNOSIS — Z5112 Encounter for antineoplastic immunotherapy: Secondary | ICD-10-CM | POA: Diagnosis not present

## 2021-12-17 DIAGNOSIS — Z853 Personal history of malignant neoplasm of breast: Secondary | ICD-10-CM | POA: Diagnosis not present

## 2021-12-17 MED ORDER — METRONIDAZOLE 0.75 % VA GEL
Freq: Every day | VAGINAL | 0 refills | Status: DC
Start: 2021-12-17 — End: 2022-01-13
  Filled 2021-12-17: qty 70, 5d supply, fill #0

## 2021-12-17 MED ORDER — TRASTUZUMAB-HYALURONIDASE-OYSK 600-10000 MG-UNT/5ML ~~LOC~~ SOLN
600.0000 mg | Freq: Once | SUBCUTANEOUS | Status: AC
  Administered 2021-12-17: 600 mg via SUBCUTANEOUS
  Filled 2021-12-17: qty 5

## 2021-12-17 NOTE — Patient Instructions (Signed)
[name]  Discharge Instructions: Thank you for choosing [facility] to provide your oncology and hematology care.   If you have a lab appointment with the [facility], please go directly to the [facility] and check in at the registration area.   Wear comfortable clothing and clothing appropriate for easy access to any Portacath or PICC line.   We strive to give you quality time with your provider. You may need to reschedule your appointment if you arrive late (15 or more minutes).  Arriving late affects you and other patients whose appointments are after yours.  Also, if you miss three or more appointments without notifying the office, you may be dismissed from the clinic at the provider's discretion.      For prescription refill requests, have your pharmacy contact our office and allow 72 hours for refills to be completed.    Today you received the following chemotherapy and/or immunotherapy agents: Herceptin Hylecta.     To help prevent nausea and vomiting after your treatment, we encourage you to take your nausea medication as directed.  BELOW ARE SYMPTOMS THAT SHOULD BE REPORTED IMMEDIATELY: . *FEVER GREATER THAN 100.4 F (38 C) OR HIGHER . *CHILLS OR SWEATING . *NAUSEA AND VOMITING THAT IS NOT CONTROLLED WITH YOUR NAUSEA MEDICATION . *UNUSUAL SHORTNESS OF BREATH . *UNUSUAL BRUISING OR BLEEDING . *URINARY PROBLEMS (pain or burning when urinating, or frequent urination) . *BOWEL PROBLEMS (unusual diarrhea, constipation, pain near the anus) . TENDERNESS IN MOUTH AND THROAT WITH OR WITHOUT PRESENCE OF ULCERS (sore throat, sores in mouth, or a toothache) . UNUSUAL RASH, SWELLING OR PAIN  . UNUSUAL VAGINAL DISCHARGE OR ITCHING   Items with * indicate a potential emergency and should be followed up as soon as possible or go to the Emergency Department if any problems should occur.  Please show the CHEMOTHERAPY ALERT CARD or  IMMUNOTHERAPY ALERT CARD at check-in to the Emergency Department and triage nurse.  Should you have questions after your visit or need to cancel or reschedule your appointment, please contact [facility]  Dept: [phone]  and follow the prompts.  Office hours are 8:00 a.m. to 4:30 p.m. Monday - Friday. Please note that voicemails left after 4:00 p.m. may not be returned until the following business day.  We are closed weekends and major holidays. You have access to a nurse at all times for urgent questions. Please call the main number to the clinic Dept: [phone] and follow the prompts.   For any non-urgent questions, you may also contact your provider using MyChart. We now offer e-Visits for anyone 18 and older to request care online for non-urgent symptoms. For details visit [email or web address].   Also download the MyChart app! Go to the app store, search "MyChart", open the app, select [facility], and log in with your MyChart username and password.  Due to Covid, a mask is required upon entering the hospital/clinic. If you do not have a mask, one will be given to you upon arrival. For doctor visits, patients may have 1 support person aged 18 or older with them. For treatment visits, patients cannot have anyone with them due to current Covid guidelines and our immunocompromised population.   

## 2021-12-18 ENCOUNTER — Other Ambulatory Visit (HOSPITAL_COMMUNITY): Payer: Self-pay

## 2021-12-19 ENCOUNTER — Other Ambulatory Visit (HOSPITAL_COMMUNITY): Payer: Self-pay

## 2021-12-19 MED ORDER — TAMOXIFEN CITRATE 20 MG PO TABS
20.0000 mg | ORAL_TABLET | Freq: Every day | ORAL | 3 refills | Status: DC
Start: 2021-08-04 — End: 2022-01-13
  Filled 2021-12-19: qty 90, 90d supply, fill #0

## 2021-12-19 MED ORDER — ACETAMINOPHEN 500 MG PO TABS
1000.0000 mg | ORAL_TABLET | Freq: Three times a day (TID) | ORAL | 3 refills | Status: DC | PRN
  Filled 2021-12-19: qty 90, 15d supply, fill #0

## 2021-12-19 MED ORDER — LETROZOLE 2.5 MG PO TABS: 2.5000 mg | ORAL_TABLET | Freq: Every day | ORAL | 3 refills | Status: DC

## 2021-12-19 MED ORDER — IBUPROFEN 800 MG PO TABS
800.0000 mg | ORAL_TABLET | Freq: Three times a day (TID) | ORAL | 0 refills | Status: DC
Start: 2021-03-11 — End: 2022-01-13

## 2021-12-19 MED ORDER — OMEPRAZOLE 20 MG PO CPDR
20.0000 mg | DELAYED_RELEASE_CAPSULE | Freq: Every day | ORAL | 0 refills | Status: DC
Start: 2021-04-02 — End: 2022-04-15
  Filled 2021-12-19: qty 30, 30d supply, fill #0

## 2021-12-19 MED ORDER — MEDROXYPROGESTERONE ACETATE 10 MG PO TABS
10.0000 mg | ORAL_TABLET | ORAL | 6 refills | Status: DC
  Filled 2021-12-19: qty 10, 10d supply, fill #0

## 2021-12-19 MED ORDER — METHOCARBAMOL 500 MG PO TABS
1000.0000 mg | ORAL_TABLET | Freq: Three times a day (TID) | ORAL | 0 refills | Status: DC
  Filled 2021-12-19: qty 60, 10d supply, fill #0

## 2021-12-19 MED ORDER — TAMOXIFEN CITRATE 20 MG PO TABS
20.0000 mg | ORAL_TABLET | Freq: Every day | ORAL | 3 refills | Status: DC
  Filled 2021-12-19: qty 90, 90d supply, fill #0

## 2021-12-21 ENCOUNTER — Encounter: Payer: Self-pay | Admitting: Hematology and Oncology

## 2021-12-24 ENCOUNTER — Other Ambulatory Visit (HOSPITAL_COMMUNITY): Payer: Self-pay

## 2021-12-24 DIAGNOSIS — M79651 Pain in right thigh: Secondary | ICD-10-CM | POA: Diagnosis not present

## 2021-12-24 DIAGNOSIS — M25651 Stiffness of right hip, not elsewhere classified: Secondary | ICD-10-CM | POA: Diagnosis not present

## 2021-12-24 DIAGNOSIS — M25551 Pain in right hip: Secondary | ICD-10-CM | POA: Diagnosis not present

## 2021-12-30 NOTE — Progress Notes (Signed)
? ?Patient Care Team: ?Pcp, No as PCP - General ?Rockwell Germany, RN as Oncology Nurse Navigator ?Mauro Kaufmann, RN as Oncology Nurse Navigator ? ?DIAGNOSIS:  ?Encounter Diagnoses  ?Name Primary?  ? Malignant neoplasm of upper-inner quadrant of left breast in female, estrogen receptor positive ([place])   ? Malignant neoplasm metastatic to bone ([place]) Yes  ? ? ?SUMMARY OF ONCOLOGIC HISTORY: ?Oncology History  ?Malignant neoplasm of upper-inner quadrant of left breast in female, estrogen receptor positive ([place])  ?04/11/2020 Initial Diagnosis  ? Patient palpated a left breast lump. Mammogram on 03/08/20 showed a 3.0cm mass at the 11 o'clock position with no axillary adenopathy. 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 ?  ?05/09/2020 Cancer Staging  ? Staging form: Breast, AJCC 8th Edition ?- Clinical stage from 05/09/2020: Stage IB (cT2, cN0, cM0, G2, ER+, PR+, HER2+) - Signed by Nicholas Lose, MD on 05/09/2020 ? ?  ?06/04/2020 Surgery  ? Left lumpectomy ([name]): invasive and in situ ductal carcinoma, 3.2cm, clear margins, one left axillary lymph node negative for carcinoma.  ?  ?02/2021 Relapse/Recurrence  ? Patient went to orthopedics for right hip pain.  Imaging revealed bone metastasis.  CT CAP: Cortical destruction of the right femoral neck consistent with skeletal metastases at risk for pathologic fracture.  Lucent lesion in the left iliac bone and L3 vertebral body consistent with multifocal skeletal metastases. ?  ?06/11/2021 -  Chemotherapy  ? Patient is on Treatment Plan : BREAST Trastuzumab SQ (Hylecta) q21d  ? ?  ?  ? ? ?CHIEF COMPLIANT: Follow-up of left breast cancer on Herceptin Hylecta and tamoxifen ? ?INTERVAL HISTORY: [facility] is a 46 y.o. with above-mentioned history of left breast cancer who underwent a left lumpectomy, completed radiation therapy, and currently on chemotherapy with Hylecta. She presents to the clinic  today for treatment. She complains of pain feels like burning sensation on the back side by her thoracic spine. Overall she is tolerating the tamoxifen quite well. ? ? ?ALLERGIES:  is allergic to adhesive [tape], phesgo [pertuz-trastuz-hyaluron-zzxf], tetanus toxoid, succinylcholine, and contrast media [iodinated contrast media]. ? ?MEDICATIONS:  ?Current Outpatient Medications  ?Medication Sig Dispense Refill  ? acetaminophen (TYLENOL) 500 MG tablet Take 2 tablets (1,000 mg total) by mouth every 8 (eight) hours as needed for mild pain 90 tablet 3  ? ALPRAZolam (XANAX) 0.5 MG tablet Take 1 tablet (0.5 mg total) by mouth 3 (three) times daily as needed for anxiety. 60 tablet 3  ? calcium carbonate (OS-CAL) 600 MG TABS tablet Take 600 mg by mouth daily with breakfast.    ? Multiple Vitamins-Minerals (MULTIVITAMIN WITH MINERALS) tablet Take 1 tablet by mouth daily.    ? omeprazole (PRILOSEC) 20 MG capsule Take 1 capsule (20 mg total) by mouth daily. 30 capsule 0  ? oxyCODONE-acetaminophen (PERCOCET) 10-325 MG tablet Take 1 tablet by mouth every 8 (eight) hours as needed for pain. 60 tablet 0  ? predniSONE (DELTASONE) 50 MG tablet Before CT scan for contrast allergy at 11 hours, 7 hours and 1 hour before scan 3 tablet 0  ? tamoxifen (NOLVADEX) 20 MG tablet Take 1 tablet (20 mg total) by mouth daily. 90 tablet 3  ? Vitamin D, Cholecalciferol, 25 MCG (1000 UT) TABS Take 1 tablet by mouth daily. 60 tablet   ? ?No current facility-administered medications for this visit.  ? ? ?PHYSICAL EXAMINATION: ?ECOG PERFORMANCE STATUS: 1 - Symptomatic but completely  ambulatory ? ?Vitals:  ? 01/13/22 1425  ?BP: (!) 150/89  ?Pulse: (!) 122  ?Resp: 19  ?Temp: (!) 97.5 ?F (36.4 ?C)  ?SpO2: 98%  ? ?Filed Weights  ? 01/13/22 1425  ?Weight: 183 lb 11.2 oz (83.3 kg)  ?  ? ?LABORATORY DATA:  ?I have reviewed the data as listed ? ?  Latest Ref Rng & Units 01/13/2022  ?  2:19 PM 11/25/2021  ?  2:34 PM 11/04/2021  ?  2:38 PM  ?CMP  ?Glucose 70 - 99  mg/dL 113   87   98    ?BUN 6 - 20 mg/dL '11   8   17    ' ?Creatinine 0.44 - 1.00 mg/dL 0.82   0.76   0.72    ?Sodium 135 - 145 mmol/L 138   139   135    ?Potassium 3.5 - 5.1 mmol/L 3.5   3.3   4.1    ?Chloride 98 - 111 mmol/L 107   109   105    ?CO2 22 - 32 mmol/L '24   24   22    ' ?Calcium 8.9 - 10.3 mg/dL 9.5   8.8   9.5    ?Total Protein 6.5 - 8.1 g/dL 7.7   7.2   7.2    ?Total Bilirubin 0.3 - 1.2 mg/dL 0.2   0.2   <0.1    ?Alkaline Phos 38 - 126 U/L 43   47   55    ?AST 15 - 41 U/L '23   20   16    ' ?ALT 0 - 44 U/L '25   20   18    ' ? ? ?Lab Results  ?Component Value Date  ? WBC 11.0 (H) 01/13/2022  ? HGB 11.5 (L) 01/13/2022  ? HCT 35.8 (L) 01/13/2022  ? MCV 88.6 01/13/2022  ? PLT 325 01/13/2022  ? NEUTROABS 8.2 (H) 01/13/2022  ? ? ?ASSESSMENT & PLAN:  ?Malignant neoplasm of upper-inner quadrant of left breast in female, estrogen receptor positive ([place]) ?(HCC) ?22/2021:Patient palpated a left breast lump. Mammogram on 03/08/20 showed a 3.0cm mass at the 11 o'clock position with no axillary adenopathy. 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 ?  ?06/04/2020:Left lumpectomy ([name]): invasive and in situ ductal carcinoma, 3.2cm, clear margins, one left axillary lymph node negative for carcinoma.  ?Patient refused adjuvant chemo, adjuvant radiation and adjuvant antiestrogen therapy. ?  ?Patient went to orthopedics for right hip pain.  Imaging revealed bone metastasis.  CT CAP: Cortical destruction of the right femoral neck consistent with skeletal metastases at risk for pathologic fracture.  Lucent lesion in the left iliac bone and L3 vertebral body consistent with multifocal skeletal metastases. ?--------------------------------------------------------------- ?04/01/2021: Femoral intramedullary nail  ?Pathology review: Metastatic breast cancer ER 2% PR 0% HER2 3+ positive ?  ?Treatment plan: Tamoxifen 20 mg was prescribed on 04/08/2021  (discontinued by patient because she felt that the risks and benefits did not support it), subcutaneous Herceptin Perjeta (Phesgo) injection (Phesgo) started 05/14/2021, switched to Herceptin Hylecta 06/11/2021 (Fixed drug eruption, profound diarrhea to Phesgo) ?Palliative radiation to the iliac bone and hip: 05/20/2021-06/03/2021  ?Resumed tamoxifen.  She is continuing tamoxifen with manageable side effects. ?   ?Bone scan 10/03/2021: Multiple sites of bone uptake consistent with bone metastases (calvarium, spine, ribs, scapula, pelvis, sternum, proximal and distal right femur) no new lesions ?  ?Mid back pain: Patient is interested in  getting an MRI to evaluate the cause of this mid upper back pain. ?We will change her Herceptin treatments to once a month. ?She gets bisphosphonate therapy with Xgeva once every 3 months. ? ?Continue with the current treatment plan of Herceptin and tamoxifen. ? ? ? ?Orders Placed This Encounter  ?Procedures  ? MR THORACIC SPINE W WO CONTRAST  ?  Standing Status:   Future  ?  Standing Expiration Date:   01/14/2023  ?  Order Specific Question:   GRA to provide read?  ?  Answer:   Yes  ?  Order Specific Question:   If indicated for the ordered procedure, I authorize the administration of contrast media per Radiology protocol  ?  Answer:   Yes  ?  Order Specific Question:   What is the patient's sedation requirement?  ?  Answer:   No Sedation  ?  Order Specific Question:   Use SRS Protocol?  ?  Answer:   No  ?  Order Specific Question:   Does the patient have a pacemaker or implanted devices?  ?  Answer:   No  ?  Order Specific Question:   Preferred imaging location?  ?  Answer:   [facility] (table limit-550lbs)  ?  Order Specific Question:   Release to patient  ?  Answer:   Immediate  ? ?The patient has a good understanding of the overall plan. she agrees with it. she will call with any problems that may develop before the next visit here. ?Total time spent: 30 mins including face to  face time and time spent for planning, charting and co-ordination of care ? ? Harriette Ohara, MD ?01/13/22 ? ? ? I Gardiner Coins am scribing for Dr. Lindi Adie ? ?I have reviewed the above documentation for accu

## 2022-01-05 ENCOUNTER — Other Ambulatory Visit: Payer: 59

## 2022-01-05 ENCOUNTER — Ambulatory Visit: Payer: 59

## 2022-01-05 ENCOUNTER — Ambulatory Visit: Payer: 59 | Admitting: Adult Health

## 2022-01-12 ENCOUNTER — Other Ambulatory Visit (HOSPITAL_COMMUNITY): Payer: Self-pay

## 2022-01-13 ENCOUNTER — Inpatient Hospital Stay: Payer: 59 | Attending: Hematology and Oncology

## 2022-01-13 ENCOUNTER — Other Ambulatory Visit: Payer: Self-pay

## 2022-01-13 ENCOUNTER — Inpatient Hospital Stay (HOSPITAL_BASED_OUTPATIENT_CLINIC_OR_DEPARTMENT_OTHER): Payer: 59 | Admitting: Hematology and Oncology

## 2022-01-13 ENCOUNTER — Other Ambulatory Visit (HOSPITAL_COMMUNITY): Payer: Self-pay

## 2022-01-13 ENCOUNTER — Inpatient Hospital Stay: Payer: 59

## 2022-01-13 ENCOUNTER — Other Ambulatory Visit: Payer: Self-pay | Admitting: *Deleted

## 2022-01-13 VITALS — BP 150/89 | HR 122 | Temp 97.5°F | Resp 19 | Ht 65.0 in | Wt 183.7 lb

## 2022-01-13 DIAGNOSIS — Z170 Estrogen receptor positive status [ER+]: Secondary | ICD-10-CM

## 2022-01-13 DIAGNOSIS — C50212 Malignant neoplasm of upper-inner quadrant of left female breast: Secondary | ICD-10-CM

## 2022-01-13 DIAGNOSIS — C7951 Secondary malignant neoplasm of bone: Secondary | ICD-10-CM | POA: Insufficient documentation

## 2022-01-13 DIAGNOSIS — Z5181 Encounter for therapeutic drug level monitoring: Secondary | ICD-10-CM

## 2022-01-13 DIAGNOSIS — Z5112 Encounter for antineoplastic immunotherapy: Secondary | ICD-10-CM | POA: Diagnosis not present

## 2022-01-13 LAB — CBC WITH DIFFERENTIAL (CANCER CENTER ONLY)
Abs Immature Granulocytes: 0.04 10*3/uL (ref 0.00–0.07)
Basophils Absolute: 0 10*3/uL (ref 0.0–0.1)
Basophils Relative: 0 %
Eosinophils Absolute: 0.2 10*3/uL (ref 0.0–0.5)
Eosinophils Relative: 1 %
HCT: 35.8 % — ABNORMAL LOW (ref 36.0–46.0)
Hemoglobin: 11.5 g/dL — ABNORMAL LOW (ref 12.0–15.0)
Immature Granulocytes: 0 %
Lymphocytes Relative: 18 %
Lymphs Abs: 2 10*3/uL (ref 0.7–4.0)
MCH: 28.5 pg (ref 26.0–34.0)
MCHC: 32.1 g/dL (ref 30.0–36.0)
MCV: 88.6 fL (ref 80.0–100.0)
Monocytes Absolute: 0.6 10*3/uL (ref 0.1–1.0)
Monocytes Relative: 5 %
Neutro Abs: 8.2 10*3/uL — ABNORMAL HIGH (ref 1.7–7.7)
Neutrophils Relative %: 76 %
Platelet Count: 325 10*3/uL (ref 150–400)
RBC: 4.04 MIL/uL (ref 3.87–5.11)
RDW: 13.6 % (ref 11.5–15.5)
WBC Count: 11 10*3/uL — ABNORMAL HIGH (ref 4.0–10.5)
nRBC: 0 % (ref 0.0–0.2)

## 2022-01-13 LAB — CMP (CANCER CENTER ONLY)
ALT: 25 U/L (ref 0–44)
AST: 23 U/L (ref 15–41)
Albumin: 4.4 g/dL (ref 3.5–5.0)
Alkaline Phosphatase: 43 U/L (ref 38–126)
Anion gap: 7 (ref 5–15)
BUN: 11 mg/dL (ref 6–20)
CO2: 24 mmol/L (ref 22–32)
Calcium: 9.5 mg/dL (ref 8.9–10.3)
Chloride: 107 mmol/L (ref 98–111)
Creatinine: 0.82 mg/dL (ref 0.44–1.00)
GFR, Estimated: >60 mL/min (ref >60)
Glucose, Bld: 113 mg/dL — ABNORMAL HIGH (ref 70–99)
Potassium: 3.5 mmol/L (ref 3.5–5.1)
Sodium: 138 mmol/L (ref 135–145)
Total Bilirubin: 0.2 mg/dL — ABNORMAL LOW (ref 0.3–1.2)
Total Protein: 7.7 g/dL (ref 6.5–8.1)

## 2022-01-13 MED ORDER — TRASTUZUMAB-HYALURONIDASE-OYSK 600-10000 MG-UNT/5ML ~~LOC~~ SOLN
600.0000 mg | Freq: Once | SUBCUTANEOUS | Status: AC
  Administered 2022-01-13: 600 mg via SUBCUTANEOUS
  Filled 2022-01-13: qty 5

## 2022-01-13 MED ORDER — DENOSUMAB 120 MG/1.7ML ~~LOC~~ SOLN
120.0000 mg | Freq: Once | SUBCUTANEOUS | Status: AC
  Administered 2022-01-13: 120 mg via SUBCUTANEOUS
  Filled 2022-01-13: qty 1.7

## 2022-01-13 NOTE — Assessment & Plan Note (Addendum)
(  [place]) ?22/2021:Patient palpated a left breast lump. Mammogram on 03/08/20 showed a 3.0cm mass at the 11 o'clock position with no axillary adenopathy. 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 ?? ?06/04/2020:Left lumpectomy ([name]): invasive and in situ ductal carcinoma, 3.2cm, clear margins, one left axillary lymph node negative for carcinoma.? ?Patient refused adjuvant chemo, adjuvant radiation and adjuvant antiestrogen therapy. ?? ?Patient went to orthopedics for right hip pain. ?Imaging revealed bone metastasis. ?CT CAP: Cortical destruction of the right femoral neck consistent with skeletal metastases at risk for pathologic fracture. ?Lucent lesion in the left iliac bone and L3 vertebral body consistent with multifocal skeletal metastases. ?--------------------------------------------------------------- ?04/01/2021: Femoral intramedullary nail? ?Pathology review: Metastatic breast cancer ER 2% PR 0% HER2 3+ positive ?? ?Treatment plan: Tamoxifen 20 mg?was prescribed on?04/08/2021?(discontinued by patient because she felt that the risks and benefits did not support it),?subcutaneous Herceptin Perjeta?(Phesgo)?injection (Phesgo)?started?05/14/2021, switched to Herceptin Hylecta 06/11/2021 (Fixed drug eruption, profound diarrhea?to Phesgo) ?Palliative radiation to the iliac bone and hip:?05/20/2021-06/03/2021 ?? ?Resumed tamoxifen. ?She is continuing tamoxifen with manageable side effects. ??  ?Bone scan 10/03/2021: Multiple sites of bone uptake consistent with bone metastases (calvarium, spine, ribs, scapula, pelvis, sternum, proximal and distal right femur) no new lesions ?? ?Mid back pain: Patient is interested in getting an MRI to evaluate the cause of this mid upper back pain. ?We will change her Herceptin treatments to once a month. ? ?Continue with the current treatment plan of Herceptin and tamoxifen. ?

## 2022-01-14 ENCOUNTER — Other Ambulatory Visit: Payer: Self-pay | Admitting: Hematology and Oncology

## 2022-01-15 ENCOUNTER — Inpatient Hospital Stay: Payer: 59

## 2022-01-15 ENCOUNTER — Other Ambulatory Visit: Payer: 59

## 2022-01-19 ENCOUNTER — Other Ambulatory Visit: Payer: Self-pay | Admitting: Adult Health

## 2022-01-20 ENCOUNTER — Other Ambulatory Visit (HOSPITAL_COMMUNITY): Payer: Self-pay

## 2022-01-20 MED ORDER — OXYCODONE-ACETAMINOPHEN 10-325 MG PO TABS
1.0000 | ORAL_TABLET | Freq: Three times a day (TID) | ORAL | 0 refills | Status: DC | PRN
  Filled 2022-01-20: qty 60, 20d supply, fill #0

## 2022-01-26 ENCOUNTER — Other Ambulatory Visit: Payer: 59

## 2022-01-26 ENCOUNTER — Ambulatory Visit: Payer: 59

## 2022-01-26 ENCOUNTER — Ambulatory Visit: Payer: 59 | Admitting: Hematology and Oncology

## 2022-01-28 DIAGNOSIS — Z0000 Encounter for general adult medical examination without abnormal findings: Secondary | ICD-10-CM | POA: Diagnosis not present

## 2022-01-28 DIAGNOSIS — R7989 Other specified abnormal findings of blood chemistry: Secondary | ICD-10-CM | POA: Diagnosis not present

## 2022-01-29 ENCOUNTER — Inpatient Hospital Stay: Payer: 59

## 2022-01-29 ENCOUNTER — Inpatient Hospital Stay: Payer: 59 | Admitting: Hematology and Oncology

## 2022-01-30 ENCOUNTER — Other Ambulatory Visit (HOSPITAL_COMMUNITY): Payer: Self-pay

## 2022-01-30 DIAGNOSIS — D649 Anemia, unspecified: Secondary | ICD-10-CM | POA: Diagnosis not present

## 2022-01-30 DIAGNOSIS — M84453D Pathological fracture, unspecified femur, subsequent encounter for fracture with routine healing: Secondary | ICD-10-CM | POA: Diagnosis not present

## 2022-01-30 DIAGNOSIS — F988 Other specified behavioral and emotional disorders with onset usually occurring in childhood and adolescence: Secondary | ICD-10-CM | POA: Diagnosis not present

## 2022-01-30 DIAGNOSIS — C50919 Malignant neoplasm of unspecified site of unspecified female breast: Secondary | ICD-10-CM | POA: Diagnosis not present

## 2022-01-30 DIAGNOSIS — C7951 Secondary malignant neoplasm of bone: Secondary | ICD-10-CM | POA: Diagnosis not present

## 2022-01-30 DIAGNOSIS — G8929 Other chronic pain: Secondary | ICD-10-CM | POA: Diagnosis not present

## 2022-01-30 DIAGNOSIS — Z0000 Encounter for general adult medical examination without abnormal findings: Secondary | ICD-10-CM | POA: Diagnosis not present

## 2022-01-30 DIAGNOSIS — J302 Other seasonal allergic rhinitis: Secondary | ICD-10-CM | POA: Diagnosis not present

## 2022-01-30 MED ORDER — AMPHETAMINE-DEXTROAMPHETAMINE 10 MG PO TABS
10.0000 mg | ORAL_TABLET | Freq: Two times a day (BID) | ORAL | 0 refills | Status: DC | PRN
  Filled 2022-01-30: qty 60, 30d supply, fill #0

## 2022-02-02 ENCOUNTER — Telehealth: Payer: Self-pay | Admitting: Hematology and Oncology

## 2022-02-02 NOTE — Telephone Encounter (Signed)
Scheduled appointment per 4/25 los. Left message. ?

## 2022-02-03 ENCOUNTER — Ambulatory Visit
Admission: RE | Admit: 2022-02-03 | Discharge: 2022-02-03 | Disposition: A | Discharge status: Home / Self Care | Payer: 59 | Source: Ambulatory Visit | Attending: Hematology and Oncology | Admitting: Hematology and Oncology

## 2022-02-03 ENCOUNTER — Ambulatory Visit (HOSPITAL_COMMUNITY)
Admission: RE | Admit: 2022-02-03 | Discharge: 2022-02-03 | Disposition: A | Discharge status: Home / Self Care | Payer: 59 | Source: Ambulatory Visit | Attending: Hematology and Oncology | Admitting: Hematology and Oncology

## 2022-02-03 DIAGNOSIS — Z5181 Encounter for therapeutic drug level monitoring: Secondary | ICD-10-CM | POA: Diagnosis not present

## 2022-02-03 DIAGNOSIS — Z0189 Encounter for other specified special examinations: Secondary | ICD-10-CM | POA: Diagnosis not present

## 2022-02-03 DIAGNOSIS — Z7960 Long term (current) use of unspecified immunomodulators and immunosuppressants: Secondary | ICD-10-CM | POA: Diagnosis not present

## 2022-02-03 DIAGNOSIS — C50212 Malignant neoplasm of upper-inner quadrant of left female breast: Secondary | ICD-10-CM

## 2022-02-03 DIAGNOSIS — S22080A Wedge compression fracture of T11-T12 vertebra, initial encounter for closed fracture: Secondary | ICD-10-CM | POA: Diagnosis not present

## 2022-02-03 DIAGNOSIS — C7951 Secondary malignant neoplasm of bone: Secondary | ICD-10-CM

## 2022-02-03 DIAGNOSIS — Z79899 Other long term (current) drug therapy: Secondary | ICD-10-CM | POA: Diagnosis not present

## 2022-02-03 LAB — ECHOCARDIOGRAM COMPLETE
AV Peak grad: 8.5 mmHg
Ao pk vel: 1.46 m/s
Area-P 1/2: 4.26 cm2
S' Lateral: 3.5 cm

## 2022-02-03 MED ORDER — GADOBENATE DIMEGLUMINE 529 MG/ML IV SOLN
15.0000 mL | Freq: Once | INTRAVENOUS | Status: AC | PRN
  Administered 2022-02-03: 15 mL via INTRAVENOUS

## 2022-02-10 ENCOUNTER — Inpatient Hospital Stay: Payer: 59

## 2022-02-10 ENCOUNTER — Other Ambulatory Visit: Payer: Self-pay

## 2022-02-10 ENCOUNTER — Inpatient Hospital Stay: Payer: 59 | Admitting: Hematology and Oncology

## 2022-02-10 ENCOUNTER — Inpatient Hospital Stay (HOSPITAL_BASED_OUTPATIENT_CLINIC_OR_DEPARTMENT_OTHER): Payer: 59 | Admitting: Hematology and Oncology

## 2022-02-10 ENCOUNTER — Inpatient Hospital Stay: Payer: 59 | Attending: Hematology and Oncology

## 2022-02-10 DIAGNOSIS — Z170 Estrogen receptor positive status [ER+]: Secondary | ICD-10-CM | POA: Insufficient documentation

## 2022-02-10 DIAGNOSIS — C7951 Secondary malignant neoplasm of bone: Secondary | ICD-10-CM | POA: Insufficient documentation

## 2022-02-10 DIAGNOSIS — C50212 Malignant neoplasm of upper-inner quadrant of left female breast: Secondary | ICD-10-CM

## 2022-02-10 DIAGNOSIS — Z5112 Encounter for antineoplastic immunotherapy: Secondary | ICD-10-CM | POA: Insufficient documentation

## 2022-02-10 LAB — CBC WITH DIFFERENTIAL (CANCER CENTER ONLY)
Abs Immature Granulocytes: 0.04 10*3/uL (ref 0.00–0.07)
Basophils Absolute: 0.1 10*3/uL (ref 0.0–0.1)
Basophils Relative: 1 %
Eosinophils Absolute: 0.3 10*3/uL (ref 0.0–0.5)
Eosinophils Relative: 3 %
HCT: 33.9 % — ABNORMAL LOW (ref 36.0–46.0)
Hemoglobin: 11.3 g/dL — ABNORMAL LOW (ref 12.0–15.0)
Immature Granulocytes: 0 %
Lymphocytes Relative: 31 %
Lymphs Abs: 3.2 10*3/uL (ref 0.7–4.0)
MCH: 29.1 pg (ref 26.0–34.0)
MCHC: 33.3 g/dL (ref 30.0–36.0)
MCV: 87.4 fL (ref 80.0–100.0)
Monocytes Absolute: 0.6 10*3/uL (ref 0.1–1.0)
Monocytes Relative: 6 %
Neutro Abs: 6.1 10*3/uL (ref 1.7–7.7)
Neutrophils Relative %: 59 %
Platelet Count: 322 10*3/uL (ref 150–400)
RBC: 3.88 MIL/uL (ref 3.87–5.11)
RDW: 13.8 % (ref 11.5–15.5)
WBC Count: 10.3 10*3/uL (ref 4.0–10.5)
nRBC: 0 % (ref 0.0–0.2)

## 2022-02-10 LAB — CMP (CANCER CENTER ONLY)
ALT: 30 U/L (ref 0–44)
AST: 23 U/L (ref 15–41)
Albumin: 4.1 g/dL (ref 3.5–5.0)
Alkaline Phosphatase: 39 U/L (ref 38–126)
Anion gap: 8 (ref 5–15)
BUN: 18 mg/dL (ref 6–20)
CO2: 24 mmol/L (ref 22–32)
Calcium: 9.8 mg/dL (ref 8.9–10.3)
Chloride: 106 mmol/L (ref 98–111)
Creatinine: 0.9 mg/dL (ref 0.44–1.00)
GFR, Estimated: >60 mL/min (ref >60)
Glucose, Bld: 134 mg/dL — ABNORMAL HIGH (ref 70–99)
Potassium: 3.7 mmol/L (ref 3.5–5.1)
Sodium: 138 mmol/L (ref 135–145)
Total Bilirubin: 0.2 mg/dL — ABNORMAL LOW (ref 0.3–1.2)
Total Protein: 7.4 g/dL (ref 6.5–8.1)

## 2022-02-10 MED ORDER — TRASTUZUMAB-HYALURONIDASE-OYSK 600-10000 MG-UNT/5ML ~~LOC~~ SOLN
600.0000 mg | Freq: Once | SUBCUTANEOUS | Status: AC
  Administered 2022-02-10: 600 mg via SUBCUTANEOUS
  Filled 2022-02-10: qty 5

## 2022-02-10 NOTE — Progress Notes (Signed)
Patient Care Team: Sueanne Margarita, DO as PCP - General (Internal Medicine) Rockwell Germany, RN as Oncology Nurse Navigator Mauro Kaufmann, RN as Oncology Nurse Navigator  DIAGNOSIS:  Encounter Diagnosis  Name Primary?   Malignant neoplasm of upper-inner quadrant of left breast in female, estrogen receptor positive ([place])     SUMMARY OF ONCOLOGIC HISTORY: Oncology History  Malignant neoplasm of upper-inner quadrant of left breast in female, estrogen receptor positive ([place])  04/11/2020 Initial Diagnosis   Patient palpated a left breast lump. Mammogram on 03/08/20 showed a 3.0cm mass at the 11 o'clock position with no axillary adenopathy. 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   05/09/2020 Cancer Staging   Staging form: Breast, AJCC 8th Edition - Clinical stage from 05/09/2020: Stage IB (cT2, cN0, cM0, G2, ER+, PR+, HER2+) - Signed by Nicholas Lose, MD on 05/09/2020    06/04/2020 Surgery   Left lumpectomy ([name]): invasive and in situ ductal carcinoma, 3.2cm, clear margins, one left axillary lymph node negative for carcinoma.    02/2021 Relapse/Recurrence   Patient went to orthopedics for right hip pain.  Imaging revealed bone metastasis.  CT CAP: Cortical destruction of the right femoral neck consistent with skeletal metastases at risk for pathologic fracture.  Lucent lesion in the left iliac bone and L3 vertebral body consistent with multifocal skeletal metastases.   06/11/2021 -  Chemotherapy   Patient is on Treatment Plan : BREAST Trastuzumab SQ (Hylecta) q21d        CHIEF COMPLIANT:Follow-up of left breast cancer on Herceptin Hylecta and tamoxifen   INTERVAL HISTORY: [facility] is a 46 y.o. with above-mentioned history of left breast cancer. She presents to the clinic today for a follow-up. She states that she is having pain right between her shoulder blades. States it has been getting better  in the last couple of weeks. States that she has been more tired. Sleeping more. But overall she is tolerating the tamoxifen. States the week after injection she had bilateral jaw pain.   ALLERGIES:  is allergic to adhesive [tape], phesgo [pertuz-trastuz-hyaluron-zzxf], tetanus toxoid, benzoin compound, iodine, succinylcholine, and contrast media [iodinated contrast media].  MEDICATIONS:  Current Outpatient Medications  Medication Sig Dispense Refill   acetaminophen (TYLENOL) 500 MG tablet Take 2 tablets (1,000 mg total) by mouth every 8 (eight) hours as needed for mild pain 90 tablet 3   ALPRAZolam (XANAX) 0.5 MG tablet Take 1 tablet (0.5 mg total) by mouth 3 (three) times daily as needed for anxiety. 60 tablet 3   amphetamine-dextroamphetamine (ADDERALL) 10 MG tablet Take 1 tablet (10 mg total) by mouth 2 (two) times daily as needed. 60 tablet 0   calcium carbonate (OS-CAL) 600 MG TABS tablet Take 600 mg by mouth daily with breakfast.     Multiple Vitamins-Minerals (MULTIVITAMIN WITH MINERALS) tablet Take 1 tablet by mouth daily.     omeprazole (PRILOSEC) 20 MG capsule Take 1 capsule (20 mg total) by mouth daily. 30 capsule 0   oxyCODONE-acetaminophen (PERCOCET) 10-325 MG tablet Take 1 tablet by mouth every 8 (eight) hours as needed for pain. 60 tablet 0   predniSONE (DELTASONE) 50 MG tablet Before CT scan for contrast allergy at 11 hours, 7 hours and 1 hour before scan 3 tablet 0   tamoxifen (NOLVADEX) 20 MG tablet Take 1 tablet (20 mg total) by mouth daily. 90 tablet 3   Vitamin D, Cholecalciferol, 25 MCG (1000  UT) TABS Take 1 tablet by mouth daily. 60 tablet    No current facility-administered medications for this visit.    PHYSICAL EXAMINATION: ECOG PERFORMANCE STATUS: 1 - Symptomatic but completely ambulatory  Vitals:   02/10/22 1416  BP: 127/69  Pulse: 66  Resp: 18  Temp: 97.7 F (36.5 C)  SpO2: 98%   Filed Weights   02/10/22 1416  Weight: 181 lb 14.4 oz (82.5 kg)       LABORATORY DATA:  I have reviewed the data as listed    Latest Ref Rng & Units 02/10/2022    2:09 PM 01/13/2022    2:19 PM 11/25/2021    2:34 PM  CMP  Glucose 70 - 99 mg/dL 134   113   87    BUN 6 - 20 mg/dL '18   11   8    ' Creatinine 0.44 - 1.00 mg/dL 0.90   0.82   0.76    Sodium 135 - 145 mmol/L 138   138   139    Potassium 3.5 - 5.1 mmol/L 3.7   3.5   3.3    Chloride 98 - 111 mmol/L 106   107   109    CO2 22 - 32 mmol/L '24   24   24    ' Calcium 8.9 - 10.3 mg/dL 9.8   9.5   8.8    Total Protein 6.5 - 8.1 g/dL 7.4   7.7   7.2    Total Bilirubin 0.3 - 1.2 mg/dL 0.2   0.2   0.2    Alkaline Phos 38 - 126 U/L 39   43   47    AST 15 - 41 U/L '23   23   20    ' ALT 0 - 44 U/L '30   25   20      ' Lab Results  Component Value Date   WBC 10.3 02/10/2022   HGB 11.3 (L) 02/10/2022   HCT 33.9 (L) 02/10/2022   MCV 87.4 02/10/2022   PLT 322 02/10/2022   NEUTROABS 6.1 02/10/2022    ASSESSMENT & PLAN:  Malignant neoplasm of upper-inner quadrant of left breast in female, estrogen receptor positive ([place]) 22/2021:Patient palpated a left breast lump. Mammogram on 03/08/20 showed a 3.0cm mass at the 11 o'clock position with no axillary adenopathy. 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  Patient refused adjuvant chemo, adjuvant radiation and adjuvant antiestrogen therapy.   Patient went to orthopedics for right hip pain.  Imaging revealed bone metastasis.  CT CAP: Cortical destruction of the right femoral neck consistent with skeletal metastases at risk for pathologic fracture.  Lucent lesion in the left iliac bone and L3 vertebral body consistent with multifocal skeletal metastases. --------------------------------------------------------------- 04/01/2021: Femoral  intramedullary nail  Pathology review: Metastatic breast cancer ER 2% PR 0% HER2 3+ positive   Treatment plan: Tamoxifen 20 mg was prescribed on 04/08/2021 (discontinued by patient because she felt that the risks and benefits did not support it), subcutaneous Herceptin Perjeta (Phesgo) injection (Phesgo) started 05/14/2021, switched to Herceptin Hylecta 06/11/2021 (Fixed drug eruption, profound diarrhea to Phesgo) Palliative radiation to the iliac bone and hip: 05/20/2021-06/03/2021  Resumed tamoxifen.  She is continuing tamoxifen with manageable side effects.    Bone scan 10/03/2021: Multiple sites of bone  uptake consistent with bone metastases (calvarium, spine, ribs, scapula, pelvis, sternum, proximal and distal right femur) no new lesions Xgeva related bone pain and jaw pain: She is very concerned about the side effect and is contemplating on not receiving any further Xgeva for the rest of this year and might receive it next year again.  Mid back pain: MRI thoracic spine 02/04/2022: Numerous bone metastases thoracic spine cervical and lumbar spines largest T5, T8, T11.  Chronic compression fractures T4, T5, T10 and T12 Because of her back pain with related to the fractures, I will refer her to Dr. Laurence Ferrari with interventional radiology for osteo cool procedure.  Return to clinic every 3 weeks for Herceptin and every 6 weeks for follow-up with me.   No orders of the defined types were placed in this encounter.  The patient has a good understanding of the overall plan. she agrees with it. she will call with any problems that may develop before the next visit here. Total time spent: 30 mins including face to face time and time spent for planning, charting and co-ordination of care   Harriette Ohara, MD 02/10/22    I Gardiner Coins am scribing for Dr. Lindi Adie  I have reviewed the above documentation for accuracy and completeness, and I agree with the above.

## 2022-02-10 NOTE — Assessment & Plan Note (Signed)
22/2021:Patient palpated a left breast lump. Mammogram on 03/08/20 showed a 3.0cm mass at the 11 o'clock position with no axillary adenopathy. 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 Patient refused adjuvant chemo, adjuvant radiation and adjuvant antiestrogen therapy.  Patient went to orthopedics for right hip pain. Imaging revealed bone metastasis. CT CAP: Cortical destruction of the right femoral neck consistent with skeletal metastases at risk for pathologic fracture. Lucent lesion in the left iliac bone and L3 vertebral body consistent with multifocal skeletal metastases. --------------------------------------------------------------- 04/01/2021: Femoral intramedullary nail Pathology review: Metastatic breast cancer ER 2% PR 0% HER2 3+ positive  Treatment plan: Tamoxifen 20 mgwas prescribed on7/19/2022(discontinued by patient because she felt that the risks and benefits did not support it),subcutaneous Herceptin Perjeta(Phesgo)injection (Phesgo)started8/24/2022, switched to Herceptin Hylecta 06/11/2021 (Fixed drug eruption, profound diarrheato Phesgo) Palliative radiation to the iliac bone and hip:05/20/2021-06/03/2021 Resumed tamoxifen. She is continuing tamoxifen with manageable side effects.  Bone scan 10/03/2021: Multiple sites of bone uptake consistent with bone metastases (calvarium, spine, ribs, scapula, pelvis, sternum, proximal and distal right femur) no new lesions  Mid back pain: MRI thoracic spine 02/04/2022: Numerous bone metastases thoracic spine cervical and lumbar spines largest T5, T8, T11.  Chronic compression fractures T4, T5, T10 and T12  We will Xgeva every 3 months and continue with the Herceptin and tamoxifen

## 2022-03-04 ENCOUNTER — Other Ambulatory Visit: Payer: Self-pay | Admitting: Hematology and Oncology

## 2022-03-04 ENCOUNTER — Other Ambulatory Visit (HOSPITAL_COMMUNITY): Payer: Self-pay

## 2022-03-04 MED ORDER — OXYCODONE-ACETAMINOPHEN 10-325 MG PO TABS
1.0000 | ORAL_TABLET | Freq: Three times a day (TID) | ORAL | 0 refills | Status: DC | PRN
  Filled 2022-03-04: qty 60, 20d supply, fill #0

## 2022-03-10 ENCOUNTER — Inpatient Hospital Stay: Payer: 59

## 2022-03-10 ENCOUNTER — Other Ambulatory Visit: Payer: 59

## 2022-03-11 ENCOUNTER — Inpatient Hospital Stay: Payer: 59

## 2022-03-11 ENCOUNTER — Telehealth: Payer: Self-pay | Admitting: *Deleted

## 2022-03-11 NOTE — Telephone Encounter (Signed)
RN placed call to pt regarding missed appt today for Hylecta.  No answer.  LVM for pt to return call to the office to reschedule if needed.

## 2022-03-12 ENCOUNTER — Other Ambulatory Visit: Payer: Self-pay | Admitting: Nurse Practitioner

## 2022-03-13 ENCOUNTER — Inpatient Hospital Stay: Payer: 59 | Attending: Hematology and Oncology

## 2022-03-13 ENCOUNTER — Other Ambulatory Visit: Payer: Self-pay

## 2022-03-13 VITALS — BP 116/84 | HR 84 | Temp 98.0°F | Resp 17

## 2022-03-13 DIAGNOSIS — Z170 Estrogen receptor positive status [ER+]: Secondary | ICD-10-CM

## 2022-03-13 DIAGNOSIS — C50212 Malignant neoplasm of upper-inner quadrant of left female breast: Secondary | ICD-10-CM | POA: Diagnosis not present

## 2022-03-13 DIAGNOSIS — Z5112 Encounter for antineoplastic immunotherapy: Secondary | ICD-10-CM | POA: Insufficient documentation

## 2022-03-13 MED ORDER — TRASTUZUMAB-HYALURONIDASE-OYSK 600-10000 MG-UNT/5ML ~~LOC~~ SOLN
600.0000 mg | Freq: Once | SUBCUTANEOUS | Status: AC
  Administered 2022-03-13: 600 mg via SUBCUTANEOUS
  Filled 2022-03-13: qty 5

## 2022-04-06 ENCOUNTER — Other Ambulatory Visit (HOSPITAL_COMMUNITY): Payer: Self-pay

## 2022-04-07 ENCOUNTER — Inpatient Hospital Stay: Payer: 59

## 2022-04-07 ENCOUNTER — Inpatient Hospital Stay: Payer: 59 | Admitting: Hematology and Oncology

## 2022-04-08 ENCOUNTER — Other Ambulatory Visit (HOSPITAL_COMMUNITY): Payer: Self-pay

## 2022-04-08 DIAGNOSIS — Z1211 Encounter for screening for malignant neoplasm of colon: Secondary | ICD-10-CM | POA: Diagnosis not present

## 2022-04-08 DIAGNOSIS — Z853 Personal history of malignant neoplasm of breast: Secondary | ICD-10-CM | POA: Diagnosis not present

## 2022-04-08 DIAGNOSIS — R898 Other abnormal findings in specimens from other organs, systems and tissues: Secondary | ICD-10-CM | POA: Diagnosis not present

## 2022-04-08 DIAGNOSIS — N926 Irregular menstruation, unspecified: Secondary | ICD-10-CM | POA: Diagnosis not present

## 2022-04-08 MED ORDER — AMPHETAMINE-DEXTROAMPHETAMINE 10 MG PO TABS
ORAL_TABLET | ORAL | 0 refills | Status: DC
Start: 2022-04-08 — End: 2022-05-14
  Filled 2022-04-08: qty 60, 30d supply, fill #0

## 2022-04-09 ENCOUNTER — Ambulatory Visit: Payer: 59

## 2022-04-09 ENCOUNTER — Other Ambulatory Visit: Payer: 59

## 2022-04-13 ENCOUNTER — Other Ambulatory Visit (HOSPITAL_COMMUNITY): Payer: Self-pay

## 2022-04-13 ENCOUNTER — Other Ambulatory Visit: Payer: Self-pay

## 2022-04-13 ENCOUNTER — Other Ambulatory Visit: Payer: Self-pay | Admitting: Hematology and Oncology

## 2022-04-13 MED ORDER — ALPRAZOLAM 0.5 MG PO TABS
0.5000 mg | ORAL_TABLET | Freq: Three times a day (TID) | ORAL | 3 refills | Status: DC | PRN
  Filled 2022-04-13: qty 60, 20d supply, fill #0
  Filled 2022-05-28: qty 60, 20d supply, fill #1
  Filled 2022-07-16: qty 60, 20d supply, fill #2
  Filled 2022-07-30 – 2022-08-03 (×4): qty 60, 20d supply, fill #3

## 2022-04-14 ENCOUNTER — Other Ambulatory Visit: Payer: Self-pay

## 2022-04-14 NOTE — Progress Notes (Signed)
Patient Care Team: Sueanne Margarita, DO as PCP - General (Internal Medicine) Rockwell Germany, RN as Oncology Nurse Navigator Mauro Kaufmann, RN as Oncology Nurse Navigator  DIAGNOSIS: No diagnosis found.  SUMMARY OF ONCOLOGIC HISTORY: Oncology History  Malignant neoplasm of upper-inner quadrant of left breast in female, estrogen receptor positive ([place])  04/11/2020 Initial Diagnosis   Patient palpated a left breast lump. Mammogram on 03/08/20 showed a 3.0cm mass at the 11 o'clock position with no axillary adenopathy. 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   05/09/2020 Cancer Staging   Staging form: Breast, AJCC 8th Edition - Clinical stage from 05/09/2020: Stage IB (cT2, cN0, cM0, G2, ER+, PR+, HER2+) - Signed by Nicholas Lose, MD on 05/09/2020   06/04/2020 Surgery   Left lumpectomy ([name]): invasive and in situ ductal carcinoma, 3.2cm, clear margins, one left axillary lymph node negative for carcinoma.    02/2021 Relapse/Recurrence   Patient went to orthopedics for right hip pain.  Imaging revealed bone metastasis.  CT CAP: Cortical destruction of the right femoral neck consistent with skeletal metastases at risk for pathologic fracture.  Lucent lesion in the left iliac bone and L3 vertebral body consistent with multifocal skeletal metastases.   06/11/2021 -  Chemotherapy   Patient is on Treatment Plan : BREAST Trastuzumab SQ (Hylecta) q21d       CHIEF COMPLIANT: Follow-up of left breast cancer on Herceptin Hylecta and tamoxifen.  INTERVAL HISTORY: Natalie Griffin is a 46 y.o. with above-mentioned history of left breast cancer. She presents to the clinic today for a follow-up.    ALLERGIES:  is allergic to adhesive [tape], phesgo [pertuz-trastuz-hyaluron-zzxf], tetanus toxoid, benzoin compound, iodine, succinylcholine, and contrast media [iodinated contrast media].  MEDICATIONS:  Current Outpatient  Medications  Medication Sig Dispense Refill   acetaminophen (TYLENOL) 500 MG tablet Take 2 tablets (1,000 mg total) by mouth every 8 (eight) hours as needed for mild pain 90 tablet 3   ALPRAZolam (XANAX) 0.5 MG tablet Take 1 tablet (0.5 mg total) by mouth 3 (three) times daily as needed for anxiety. 60 tablet 3   amphetamine-dextroamphetamine (ADDERALL) 10 MG tablet Take 1 tablet (10 mg total) by mouth 2 (two) times daily as needed. 60 tablet 0   calcium carbonate (OS-CAL) 600 MG TABS tablet Take 600 mg by mouth daily with breakfast.     Multiple Vitamins-Minerals (MULTIVITAMIN WITH MINERALS) tablet Take 1 tablet by mouth daily.     omeprazole (PRILOSEC) 20 MG capsule Take 1 capsule (20 mg total) by mouth daily. 30 capsule 0   oxyCODONE-acetaminophen (PERCOCET) 10-325 MG tablet Take 1 tablet by mouth every 8 (eight) hours as needed for pain. 60 tablet 0   predniSONE (DELTASONE) 50 MG tablet Before CT scan for contrast allergy at 11 hours, 7 hours and 1 hour before scan 3 tablet 0   tamoxifen (NOLVADEX) 20 MG tablet Take 1 tablet (20 mg total) by mouth daily. 90 tablet 3   Vitamin D, Cholecalciferol, 25 MCG (1000 UT) TABS Take 1 tablet by mouth daily. 60 tablet    No current facility-administered medications for this visit.    PHYSICAL EXAMINATION: ECOG PERFORMANCE STATUS: {CHL ONC ECOG PS:[phone]}  There were no vitals filed for this visit. There were no vitals filed for this visit.  BREAST:*** No palpable masses or nodules in either right or left breasts. No palpable axillary supraclavicular or infraclavicular adenopathy no breast tenderness or  nipple discharge. (exam performed in the presence of a chaperone)  LABORATORY DATA:  I have reviewed the data as listed    Latest Ref Rng & Units 02/10/2022    2:09 PM 01/13/2022    2:19 PM 11/25/2021    2:34 PM  CMP  Glucose 70 - 99 mg/dL 134  113  87   BUN 6 - 20 mg/dL _0 Creatinine 0.44 - 1.00 mg/dL 0.90  0.82  0.76   Sodium 135  - 145 mmol/L 138  138  139   Potassium 3.5 - 5.1 mmol/L 3.7  3.5  3.3   Chloride 98 - 111 mmol/L 106  107  109   CO2 22 - 32 mmol/L _1 Calcium 8.9 - 10.3 mg/dL 9.8  9.5  8.8   Total Protein 6.5 - 8.1 g/dL 7.4  7.7  7.2   Total Bilirubin 0.3 - 1.2 mg/dL 0.2  0.2  0.2   Alkaline Phos 38 - 126 U/L 39  43  47   AST 15 - 41 U/L _2 ALT 0 - 44 U/L _3 Lab Results  Component Value Date   WBC 10.3 02/10/2022   HGB 11.3 (L) 02/10/2022   HCT 33.9 (L) 02/10/2022   MCV 87.4 02/10/2022   PLT 322 02/10/2022   NEUTROABS 6.1 02/10/2022    ASSESSMENT & PLAN:  No problem-specific Assessment & Plan notes found for this encounter.    No orders of the defined types were placed in this encounter.  The patient has a good understanding of the overall plan. she agrees with it. she will call with any problems that may develop before the next visit here. Total time spent: 30 mins including face to face time and time spent for planning, charting and co-ordination of care   Suzzette Righter, [place] 04/14/22    I Gardiner Coins am scribing for Dr. Lindi Adie  ***

## 2022-04-15 ENCOUNTER — Inpatient Hospital Stay: Payer: 59 | Attending: Hematology and Oncology

## 2022-04-15 ENCOUNTER — Inpatient Hospital Stay: Payer: 59

## 2022-04-15 ENCOUNTER — Encounter: Payer: Self-pay | Admitting: Gastroenterology

## 2022-04-15 ENCOUNTER — Telehealth: Payer: Self-pay | Admitting: Gastroenterology

## 2022-04-15 ENCOUNTER — Inpatient Hospital Stay (HOSPITAL_BASED_OUTPATIENT_CLINIC_OR_DEPARTMENT_OTHER): Payer: 59 | Admitting: Hematology and Oncology

## 2022-04-15 ENCOUNTER — Other Ambulatory Visit: Payer: Self-pay

## 2022-04-15 VITALS — BP 126/87 | HR 98 | Temp 97.7°F | Resp 18 | Ht 65.0 in | Wt 176.5 lb

## 2022-04-15 DIAGNOSIS — Z170 Estrogen receptor positive status [ER+]: Secondary | ICD-10-CM | POA: Diagnosis not present

## 2022-04-15 DIAGNOSIS — C50212 Malignant neoplasm of upper-inner quadrant of left female breast: Secondary | ICD-10-CM | POA: Diagnosis not present

## 2022-04-15 DIAGNOSIS — Z5112 Encounter for antineoplastic immunotherapy: Secondary | ICD-10-CM | POA: Diagnosis not present

## 2022-04-15 DIAGNOSIS — C7951 Secondary malignant neoplasm of bone: Secondary | ICD-10-CM | POA: Insufficient documentation

## 2022-04-15 LAB — CMP (CANCER CENTER ONLY)
ALT: 32 U/L (ref 0–44)
AST: 31 U/L (ref 15–41)
Albumin: 4.1 g/dL (ref 3.5–5.0)
Alkaline Phosphatase: 40 U/L (ref 38–126)
Anion gap: 11 (ref 5–15)
BUN: 15 mg/dL (ref 6–20)
CO2: 20 mmol/L — ABNORMAL LOW (ref 22–32)
Calcium: 9.6 mg/dL (ref 8.9–10.3)
Chloride: 110 mmol/L (ref 98–111)
Creatinine: 0.79 mg/dL (ref 0.44–1.00)
GFR, Estimated: >60 mL/min (ref >60)
Glucose, Bld: 114 mg/dL — ABNORMAL HIGH (ref 70–99)
Potassium: 4 mmol/L (ref 3.5–5.1)
Sodium: 141 mmol/L (ref 135–145)
Total Bilirubin: 0.2 mg/dL — ABNORMAL LOW (ref 0.3–1.2)
Total Protein: 8 g/dL (ref 6.5–8.1)

## 2022-04-15 LAB — CBC WITH DIFFERENTIAL (CANCER CENTER ONLY)
Abs Immature Granulocytes: 0.15 10*3/uL — ABNORMAL HIGH (ref 0.00–0.07)
Basophils Absolute: 0.1 10*3/uL (ref 0.0–0.1)
Basophils Relative: 1 %
Eosinophils Absolute: 0.6 10*3/uL — ABNORMAL HIGH (ref 0.0–0.5)
Eosinophils Relative: 6 %
HCT: 37.5 % (ref 36.0–46.0)
Hemoglobin: 12.4 g/dL (ref 12.0–15.0)
Immature Granulocytes: 2 %
Lymphocytes Relative: 27 %
Lymphs Abs: 2.4 10*3/uL (ref 0.7–4.0)
MCH: 28.8 pg (ref 26.0–34.0)
MCHC: 33.1 g/dL (ref 30.0–36.0)
MCV: 87 fL (ref 80.0–100.0)
Monocytes Absolute: 0.6 10*3/uL (ref 0.1–1.0)
Monocytes Relative: 7 %
Neutro Abs: 5.2 10*3/uL (ref 1.7–7.7)
Neutrophils Relative %: 57 %
Platelet Count: 300 10*3/uL (ref 150–400)
RBC: 4.31 MIL/uL (ref 3.87–5.11)
RDW: 13.3 % (ref 11.5–15.5)
WBC Count: 8.9 10*3/uL (ref 4.0–10.5)
nRBC: 0 % (ref 0.0–0.2)

## 2022-04-15 MED ORDER — TRASTUZUMAB-HYALURONIDASE-OYSK 600-10000 MG-UNT/5ML ~~LOC~~ SOLN
600.0000 mg | Freq: Once | SUBCUTANEOUS | Status: AC
  Administered 2022-04-15: 600 mg via SUBCUTANEOUS
  Filled 2022-04-15: qty 5

## 2022-04-15 NOTE — Telephone Encounter (Signed)
-----   Message from Mauri Pole, MD sent at 04/15/2022  3:34 PM EDT ----- Regarding: RE: Colonoscopy [place], we will get her in.  [place], can you please schedule patient for direct colonoscopy at [facility] with pre visit, next available. 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 She really wants a colonoscopy for colon cancer screening. Can you get her in? Thanks [employer]

## 2022-04-15 NOTE — Progress Notes (Signed)
Patient refusing xgeva today. Says that the pain and side effects from medication out weigh benefit. Doctor notified.

## 2022-04-15 NOTE — Assessment & Plan Note (Addendum)
22/2021:Patient palpated a left breast lump. Mammogram on 03/08/20 showed a 3.0cm mass at the 11 o'clock position with no axillary adenopathy. 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 Patient refused adjuvant chemo, adjuvant radiation and adjuvant antiestrogen therapy.  Patient went to orthopedics for right hip pain. Imaging revealed bone metastasis. CT CAP: Cortical destruction of the right femoral neck consistent with skeletal metastases at risk for pathologic fracture. Lucent lesion in the left iliac bone and L3 vertebral body consistent with multifocal skeletal metastases. --------------------------------------------------------------- 04/01/2021: Femoral intramedullary nail Pathology review: Metastatic breast cancer ER 2% PR 0% HER2 3+ positive  Treatment plan: Tamoxifen 20 mgwas prescribed on7/19/2022(discontinued by patient because she felt that the risks and benefits did not support it),subcutaneous Herceptin Perjeta(Phesgo)injection started8/24/2022, switched to Herceptin Hylecta 06/11/2021 (Fixed drug eruption, profound diarrheato Phesgo)  Palliative radiation to the iliac bone and hip:05/20/2021-06/03/2021 Resumed tamoxifen. She is continuing tamoxifen with manageable side effects.  Mid back pain: MRI thoracic spine 02/04/2022: Numerous bone metastases thoracic spine cervical and lumbar spines largest T5, T8, T11.  Chronic compression fractures T4, T5, T10 and T12  CT chest abdomen pelvis prior to September appointment. Return to clinic every 4 weeks for Herceptin and every 8 weeks for follow-up with me.

## 2022-04-15 NOTE — Telephone Encounter (Signed)
Spoke to patient and scheduled at her convenience. We had sooner availability but she needs to coordinate with care partner's availability.

## 2022-04-16 ENCOUNTER — Other Ambulatory Visit: Payer: Self-pay

## 2022-04-30 ENCOUNTER — Other Ambulatory Visit: Payer: Self-pay | Admitting: Hematology and Oncology

## 2022-05-01 ENCOUNTER — Other Ambulatory Visit: Payer: Self-pay | Admitting: *Deleted

## 2022-05-01 ENCOUNTER — Other Ambulatory Visit (HOSPITAL_COMMUNITY): Payer: Self-pay

## 2022-05-01 ENCOUNTER — Encounter: Payer: Self-pay | Admitting: Hematology and Oncology

## 2022-05-01 DIAGNOSIS — C50212 Malignant neoplasm of upper-inner quadrant of left female breast: Secondary | ICD-10-CM

## 2022-05-01 MED ORDER — OXYCODONE-ACETAMINOPHEN 10-325 MG PO TABS
1.0000 | ORAL_TABLET | Freq: Three times a day (TID) | ORAL | 0 refills | Status: DC | PRN
  Filled 2022-05-01: qty 60, 20d supply, fill #0

## 2022-05-01 MED ORDER — ONDANSETRON HCL 8 MG PO TABS: 8.0000 mg | ORAL_TABLET | Freq: Three times a day (TID) | ORAL | 1 refills | Status: DC | PRN

## 2022-05-01 MED ORDER — PREDNISONE 50 MG PO TABS: ORAL_TABLET | ORAL | 1 refills | Status: DC

## 2022-05-01 NOTE — Progress Notes (Signed)
Received message from pt with complaint of weight loss and abdominal pain. Pt states she tested positive for Covid 04/24/22.  Per [place] guidelines, pt is not able to come into the [place] for 21 day.  Per MD pt needing CT CAP for further evaluation.  Orders placed and appt scheduled per [facility] pt guidelines with Covid.  MD virtual f/u scheduled.  Pt notified of appt dates and educated to seek care in the ED if symptoms become worse prior to scans.

## 2022-05-05 ENCOUNTER — Inpatient Hospital Stay: Payer: 59

## 2022-05-05 ENCOUNTER — Telehealth: Payer: Self-pay | Admitting: Hematology and Oncology

## 2022-05-05 ENCOUNTER — Inpatient Hospital Stay: Payer: 59 | Admitting: Hematology and Oncology

## 2022-05-05 NOTE — Telephone Encounter (Signed)
Per 8/15 phone line pt called to cancel

## 2022-05-06 ENCOUNTER — Ambulatory Visit (HOSPITAL_COMMUNITY): Payer: 59

## 2022-05-07 ENCOUNTER — Ambulatory Visit (HOSPITAL_COMMUNITY)
Admission: RE | Admit: 2022-05-07 | Discharge: 2022-05-07 | Disposition: A | Discharge status: Home / Self Care | Payer: 59 | Source: Ambulatory Visit | Attending: Hematology and Oncology | Admitting: Hematology and Oncology

## 2022-05-07 ENCOUNTER — Inpatient Hospital Stay: Payer: 59 | Admitting: Hematology and Oncology

## 2022-05-07 DIAGNOSIS — Z170 Estrogen receptor positive status [ER+]: Secondary | ICD-10-CM | POA: Diagnosis not present

## 2022-05-07 DIAGNOSIS — C50212 Malignant neoplasm of upper-inner quadrant of left female breast: Secondary | ICD-10-CM | POA: Insufficient documentation

## 2022-05-07 DIAGNOSIS — R911 Solitary pulmonary nodule: Secondary | ICD-10-CM | POA: Diagnosis not present

## 2022-05-07 DIAGNOSIS — K760 Fatty (change of) liver, not elsewhere classified: Secondary | ICD-10-CM | POA: Diagnosis not present

## 2022-05-07 MED ORDER — IOHEXOL 300 MG/ML  SOLN
100.0000 mL | Freq: Once | INTRAMUSCULAR | Status: AC | PRN
Start: 2022-05-07 — End: 2022-05-07
  Administered 2022-05-07: 100 mL via INTRAVENOUS

## 2022-05-13 ENCOUNTER — Inpatient Hospital Stay: Payer: 59

## 2022-05-13 ENCOUNTER — Other Ambulatory Visit (HOSPITAL_COMMUNITY): Payer: Self-pay

## 2022-05-14 ENCOUNTER — Other Ambulatory Visit: Payer: Self-pay | Admitting: Hematology and Oncology

## 2022-05-14 ENCOUNTER — Telehealth: Payer: Self-pay | Admitting: *Deleted

## 2022-05-14 ENCOUNTER — Ambulatory Visit (AMBULATORY_SURGERY_CENTER): Payer: 59 | Admitting: *Deleted

## 2022-05-14 ENCOUNTER — Other Ambulatory Visit (HOSPITAL_COMMUNITY): Payer: Self-pay

## 2022-05-14 VITALS — Ht 65.0 in | Wt 176.0 lb

## 2022-05-14 DIAGNOSIS — Z1211 Encounter for screening for malignant neoplasm of colon: Secondary | ICD-10-CM

## 2022-05-14 DIAGNOSIS — Z1231 Encounter for screening mammogram for malignant neoplasm of breast: Secondary | ICD-10-CM

## 2022-05-14 MED ORDER — NA SULFATE-K SULFATE-MG SULF 17.5-3.13-1.6 GM/177ML PO SOLN
1.0000 | ORAL | 0 refills | Status: DC
  Filled 2022-05-14: qty 354, 1d supply, fill #0

## 2022-05-14 MED ORDER — AMPHETAMINE-DEXTROAMPHETAMINE 10 MG PO TABS
10.0000 mg | ORAL_TABLET | Freq: Two times a day (BID) | ORAL | 0 refills | Status: DC | PRN
  Filled 2022-05-14: qty 60, 30d supply, fill #0

## 2022-05-14 NOTE — Progress Notes (Signed)
Patient's pre-visit was done today over the phone with the patient. Name,DOB and address verified. Patient denies any allergies to Eggs and Soy. Patient denies any problems with Propofol sedation. Patient is not taking any diet pills or blood thinners. No home Oxygen. Insurance confirmed with patient. Pt has BM every day or every other day.  Went over prep instructions with patient. Prep instructions sent to pt's MyChart & mailed to pt-pt is aware. Patient understands to call us back with any questions or concerns. Patient is aware of our care-partner policy.

## 2022-05-14 NOTE — Telephone Encounter (Signed)
John,  Please review pt's chart. See allergy list. Patient denies any problems with propofol.  Okay for [place]? Thanks, Ilija Maxim PV

## 2022-05-15 ENCOUNTER — Inpatient Hospital Stay: Payer: 59

## 2022-05-15 ENCOUNTER — Telehealth: Payer: Self-pay | Admitting: Hematology and Oncology

## 2022-05-15 ENCOUNTER — Other Ambulatory Visit: Payer: Self-pay | Admitting: *Deleted

## 2022-05-15 DIAGNOSIS — Z79899 Other long term (current) drug therapy: Secondary | ICD-10-CM

## 2022-05-15 NOTE — Progress Notes (Signed)
Per MD okay to treat today with last echo 02/03/22. Verbal orders received to obtain repeat within the next 2 weeks.

## 2022-05-15 NOTE — Telephone Encounter (Signed)
Rescheduled appointment per patient and 8/25 secure chat. Patient is aware of the changes made to her upcoming appointment. Waiting for pharmacy to updated the treatment plan so the other appointments can be moved as well. RN Merleen Nicely notified as well.

## 2022-05-15 NOTE — Telephone Encounter (Signed)
Patient called in to rescheduled appointment because she got called in to work. I advised patient that I would check with the charge nurse, but we have a limited amount of space. Patient stated " is this going to take long, I have things to do"   I told patient I would put it on her mychart.

## 2022-05-18 NOTE — Telephone Encounter (Signed)
Robbin,  This pt is cleared for anesthetic care at [facility].   Thanks,   Yardley Beltran 

## 2022-05-19 ENCOUNTER — Ambulatory Visit: Payer: 59

## 2022-05-20 ENCOUNTER — Inpatient Hospital Stay: Payer: 59 | Attending: Hematology and Oncology

## 2022-05-20 ENCOUNTER — Other Ambulatory Visit: Payer: Self-pay

## 2022-05-20 ENCOUNTER — Encounter: Payer: Self-pay | Admitting: *Deleted

## 2022-05-20 ENCOUNTER — Ambulatory Visit: Payer: 59

## 2022-05-20 VITALS — BP 141/83 | HR 99 | Temp 98.6°F | Resp 17 | Wt 171.8 lb

## 2022-05-20 DIAGNOSIS — C50212 Malignant neoplasm of upper-inner quadrant of left female breast: Secondary | ICD-10-CM | POA: Diagnosis not present

## 2022-05-20 DIAGNOSIS — Z5112 Encounter for antineoplastic immunotherapy: Secondary | ICD-10-CM | POA: Insufficient documentation

## 2022-05-20 DIAGNOSIS — Z170 Estrogen receptor positive status [ER+]: Secondary | ICD-10-CM

## 2022-05-20 MED ORDER — TRASTUZUMAB-HYALURONIDASE-OYSK 600-10000 MG-UNT/5ML ~~LOC~~ SOLN
600.0000 mg | Freq: Once | SUBCUTANEOUS | Status: AC
  Administered 2022-05-20: 600 mg via SUBCUTANEOUS
  Filled 2022-05-20: qty 5

## 2022-05-20 NOTE — Patient Instructions (Signed)
[facility] MEDICAL [facility]COLOGY  Discharge Instructions: Thank you for choosing [facility] to provide your oncology and hematology care.   If you have a lab appointment with the Cancer Center, please go directly to the Cancer Center and check in at the registration area.   Wear comfortable clothing and clothing appropriate for easy access to any Portacath or PICC line.   We strive to give you quality time with your provider. You may need to reschedule your appointment if you arrive late (15 or more minutes).  Arriving late affects you and other patients whose appointments are after yours.  Also, if you miss three or more appointments without notifying the office, you may be dismissed from the clinic at the provider's discretion.      For prescription refill requests, have your pharmacy contact our office and allow 72 hours for refills to be completed.    Today you received the following chemotherapy and/or immunotherapy agents: herceptin hylecta      To help prevent nausea and vomiting after your treatment, we encourage you to take your nausea medication as directed.  BELOW ARE SYMPTOMS THAT SHOULD BE REPORTED IMMEDIATELY: *FEVER GREATER THAN 100.4 F (38 C) OR HIGHER *CHILLS OR SWEATING *NAUSEA AND VOMITING THAT IS NOT CONTROLLED WITH YOUR NAUSEA MEDICATION *UNUSUAL SHORTNESS OF BREATH *UNUSUAL BRUISING OR BLEEDING *URINARY PROBLEMS (pain or burning when urinating, or frequent urination) *BOWEL PROBLEMS (unusual diarrhea, constipation, pain near the anus) TENDERNESS IN MOUTH AND THROAT WITH OR WITHOUT PRESENCE OF ULCERS (sore throat, sores in mouth, or a toothache) UNUSUAL RASH, SWELLING OR PAIN  UNUSUAL VAGINAL DISCHARGE OR ITCHING   Items with * indicate a potential emergency and should be followed up as soon as possible or go to the Emergency Department if any problems should occur.  Please show the CHEMOTHERAPY ALERT CARD or IMMUNOTHERAPY ALERT CARD at  check-in to the Emergency Department and triage nurse.  Should you have questions after your visit or need to cancel or reschedule your appointment, please contact [facility]  Dept: [phone]  and follow the prompts.  Office hours are 8:00 a.m. to 4:30 p.m. Monday - Friday. Please note that voicemails left after 4:00 p.m. may not be returned until the following business day.  We are closed weekends and major holidays. You have access to a nurse at all times for urgent questions. Please call the main number to the clinic Dept: [phone] and follow the prompts.   For any non-urgent questions, you may also contact your provider using MyChart. We now offer e-Visits for anyone 18 and older to request care online for non-urgent symptoms. For details visit [email or web address].   Also download the MyChart app! Go to the app store, search "MyChart", open the app, select [facility], and log in with your MyChart username and password.  Masks are optional in the cancer centers. If you would like for your care team to wear a mask while they are taking care of you, please let them know. You may have one support person who is at least 47 years old accompany you for your appointments. 

## 2022-05-20 NOTE — Progress Notes (Signed)
Pt refusing Xgeva injection at this time due to jaw pain with last injection.  Per MD Delton See needing to be removed from tx plan.

## 2022-05-20 NOTE — Progress Notes (Signed)
Per MD okay to proceed with Xgeva injection today with last CMET results on  04/15/2022.

## 2022-05-28 ENCOUNTER — Other Ambulatory Visit (HOSPITAL_COMMUNITY): Payer: Self-pay

## 2022-05-29 ENCOUNTER — Ambulatory Visit
Admission: RE | Admit: 2022-05-29 | Discharge: 2022-05-29 | Disposition: A | Discharge status: Home / Self Care | Payer: 59 | Source: Ambulatory Visit | Attending: Hematology and Oncology | Admitting: Hematology and Oncology

## 2022-05-29 DIAGNOSIS — Z1231 Encounter for screening mammogram for malignant neoplasm of breast: Secondary | ICD-10-CM

## 2022-06-01 ENCOUNTER — Encounter: Payer: Self-pay | Admitting: Gastroenterology

## 2022-06-02 ENCOUNTER — Ambulatory Visit: Payer: 59

## 2022-06-02 ENCOUNTER — Other Ambulatory Visit: Payer: 59

## 2022-06-08 ENCOUNTER — Ambulatory Visit (HOSPITAL_COMMUNITY): Payer: 59 | Attending: Hematology and Oncology

## 2022-06-08 DIAGNOSIS — Z5181 Encounter for therapeutic drug level monitoring: Secondary | ICD-10-CM | POA: Insufficient documentation

## 2022-06-08 DIAGNOSIS — Z79899 Other long term (current) drug therapy: Secondary | ICD-10-CM | POA: Diagnosis not present

## 2022-06-08 DIAGNOSIS — I361 Nonrheumatic tricuspid (valve) insufficiency: Secondary | ICD-10-CM

## 2022-06-08 LAB — ECHOCARDIOGRAM COMPLETE
Area-P 1/2: 4.12 cm2
S' Lateral: 3.2 cm

## 2022-06-11 ENCOUNTER — Inpatient Hospital Stay: Payer: 59

## 2022-06-11 ENCOUNTER — Inpatient Hospital Stay: Payer: 59 | Admitting: Hematology and Oncology

## 2022-06-15 ENCOUNTER — Telehealth: Payer: Self-pay | Admitting: Hematology and Oncology

## 2022-06-15 NOTE — Telephone Encounter (Signed)
Called patient to get her scheduled for more monthly infusions. Left voicemail.

## 2022-06-15 NOTE — Progress Notes (Signed)
Patient Care Team: Sueanne Margarita, DO as PCP - General (Internal Medicine) Rockwell Germany, RN as Oncology Nurse Navigator Mauro Kaufmann, RN as Oncology Nurse Navigator  DIAGNOSIS: No diagnosis found.  SUMMARY OF ONCOLOGIC HISTORY: Oncology History  Malignant neoplasm of upper-inner quadrant of left breast in female, estrogen receptor positive ([place])  04/11/2020 Initial Diagnosis   Patient palpated a left breast lump. Mammogram on 03/08/20 showed a 3.0cm mass at the 11 o'clock position with no axillary adenopathy. 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   05/09/2020 Cancer Staging   Staging form: Breast, AJCC 8th Edition - Clinical stage from 05/09/2020: Stage IB (cT2, cN0, cM0, G2, ER+, PR+, HER2+) - Signed by Nicholas Lose, MD on 05/09/2020   06/04/2020 Surgery   Left lumpectomy ([name]): invasive and in situ ductal carcinoma, 3.2cm, clear margins, one left axillary lymph node negative for carcinoma.    02/2021 Relapse/Recurrence   Patient went to orthopedics for right hip pain.  Imaging revealed bone metastasis.  CT CAP: Cortical destruction of the right femoral neck consistent with skeletal metastases at risk for pathologic fracture.  Lucent lesion in the left iliac bone and L3 vertebral body consistent with multifocal skeletal metastases.   06/11/2021 -  Chemotherapy   Patient is on Treatment Plan : BREAST Trastuzumab SQ (Hylecta) q21d       CHIEF COMPLIANT: Follow-up of left breast cancer on Herceptin Hylecta and tamoxifen.    INTERVAL HISTORY: Natalie Griffin is a 46 y.o. with above-mentioned history of left breast cancer. She presents to the clinic today for a follow-up.   ALLERGIES:  is allergic to adhesive [tape], phesgo [pertuz-trastuz-hyaluron-zzxf], tetanus toxoid, iodine, succinylcholine, and contrast media [iodinated contrast media].  MEDICATIONS:  Current Outpatient Medications   Medication Sig Dispense Refill   acetaminophen (TYLENOL) 500 MG tablet Take 2 tablets (1,000 mg total) by mouth every 8 (eight) hours as needed for mild pain 90 tablet 3   ALPRAZolam (XANAX) 0.5 MG tablet Take 1 tablet (0.5 mg total) by mouth 3 (three) times daily as needed for anxiety. (Patient not taking: Reported on 05/14/2022) 60 tablet 3   amphetamine-dextroamphetamine (ADDERALL) 10 MG tablet Take 1 tablet (10 mg total) by mouth 2 (two) times daily as needed. 60 tablet 0   calcium carbonate (OS-CAL) 600 MG TABS tablet Take 600 mg by mouth daily with breakfast.     Multiple Vitamins-Minerals (MULTIVITAMIN WITH MINERALS) tablet Take 1 tablet by mouth daily.     Na Sulfate-K Sulfate-Mg Sulf 17.5-3.13-1.6 GM/177ML SOLN Take 1 kit by mouth as directed. 354 mL 0   oxyCODONE-acetaminophen (PERCOCET) 10-325 MG tablet Take 1 tablet by mouth every 8 (eight) hours as needed for pain. 60 tablet 0   tamoxifen (NOLVADEX) 20 MG tablet Take 1 tablet (20 mg total) by mouth daily. 90 tablet 3   Vitamin D, Cholecalciferol, 25 MCG (1000 UT) TABS Take 1 tablet by mouth daily. 60 tablet    No current facility-administered medications for this visit.    PHYSICAL EXAMINATION: ECOG PERFORMANCE STATUS: {CHL ONC ECOG [phone]}  There were no vitals filed for this visit. There were no vitals filed for this visit.  BREAST:*** No palpable masses or nodules in either right or left breasts. No palpable axillary supraclavicular or infraclavicular adenopathy no breast tenderness or nipple discharge. (exam performed in the presence of a chaperone)  LABORATORY DATA:  I have reviewed the data as listed  Latest Ref Rng & Units 04/15/2022    1:49 PM 02/10/2022    2:09 PM 01/13/2022    2:19 PM  CMP  Glucose 70 - 99 mg/dL 114  134  113   BUN 6 - 20 mg/dL '15  18  11   ' Creatinine 0.44 - 1.00 mg/dL 0.79  0.90  0.82   Sodium 135 - 145 mmol/L 141  138  138   Potassium 3.5 - 5.1 mmol/L 4.0  3.7  3.5   Chloride 98 -  111 mmol/L 110  106  107   CO2 22 - 32 mmol/L '20  24  24   ' Calcium 8.9 - 10.3 mg/dL 9.6  9.8  9.5   Total Protein 6.5 - 8.1 g/dL 8.0  7.4  7.7   Total Bilirubin 0.3 - 1.2 mg/dL 0.2  0.2  0.2   Alkaline Phos 38 - 126 U/L 40  39  43   AST 15 - 41 U/L '31  23  23   ' ALT 0 - 44 U/L 32  30  25     Lab Results  Component Value Date   WBC 8.9 04/15/2022   HGB 12.4 04/15/2022   HCT 37.5 04/15/2022   MCV 87.0 04/15/2022   PLT 300 04/15/2022   NEUTROABS 5.2 04/15/2022    ASSESSMENT & PLAN:  No problem-specific Assessment & Plan notes found for this encounter.    No orders of the defined types were placed in this encounter.  The patient has a good understanding of the overall plan. she agrees with it. she will call with any problems that may develop before the next visit here. Total time spent: 30 mins including face to face time and time spent for planning, charting and co-ordination of care   Suzzette Righter, [place] 06/15/22    I Gardiner Coins am scribing for Dr. Lindi Adie  ***

## 2022-06-16 ENCOUNTER — Ambulatory Visit (AMBULATORY_SURGERY_CENTER): Payer: 59 | Admitting: Gastroenterology

## 2022-06-16 ENCOUNTER — Encounter: Payer: Self-pay | Admitting: Gastroenterology

## 2022-06-16 VITALS — BP 119/54 | HR 60 | Temp 97.3°F | Resp 12 | Ht 65.0 in | Wt 176.0 lb

## 2022-06-16 DIAGNOSIS — D122 Benign neoplasm of ascending colon: Secondary | ICD-10-CM

## 2022-06-16 DIAGNOSIS — D125 Benign neoplasm of sigmoid colon: Secondary | ICD-10-CM

## 2022-06-16 DIAGNOSIS — D120 Benign neoplasm of cecum: Secondary | ICD-10-CM | POA: Diagnosis not present

## 2022-06-16 DIAGNOSIS — K635 Polyp of colon: Secondary | ICD-10-CM | POA: Diagnosis not present

## 2022-06-16 DIAGNOSIS — Z1211 Encounter for screening for malignant neoplasm of colon: Secondary | ICD-10-CM | POA: Diagnosis not present

## 2022-06-16 MED ORDER — SODIUM CHLORIDE 0.9 % IV SOLN: 500.0000 mL | Freq: Once | INTRAVENOUS | Status: DC

## 2022-06-16 NOTE — Progress Notes (Signed)
Prior to procedure sedation, in holding room, discussed DNR status with Natalie Griffin. Pt states that she does not want compressions nor intubation but will except vasopressor use during propofol administration if necessary. Dr. Silverio Decamp aware of patient's DNR status.

## 2022-06-16 NOTE — Progress Notes (Signed)
Called to room to assist during endoscopic procedure.  Patient ID and intended procedure confirmed with present staff. Received instructions for my participation in the procedure from the performing physician.  

## 2022-06-16 NOTE — Progress Notes (Signed)
Pt's states no medical or surgical changes since previsit or office visit. 

## 2022-06-16 NOTE — Progress Notes (Signed)
[facility] History and Physical   Primary Care Physician:  Sueanne Margarita, DO   Reason for Procedure:  Colorectal cancer screening  Plan:    Screening colonoscopy with possible interventions as needed     HPI: Natalie Griffin is a very pleasant 46 y.o. female here for screening colonoscopy. Denies any nausea, vomiting, abdominal pain, melena or bright red blood per rectum  The risks and benefits as well as alternatives of endoscopic procedure(s) have been discussed and reviewed. All questions answered. The patient agrees to proceed.    Past Medical History:  Diagnosis Date   Anemia    Bone metastasis    Breast cancer ([place]) 2021   Left breast invasive ductal carcinoma   Cancer ([place]) 2021   Left breast   Family history of adverse reaction to anesthesia    Father and Aunt have pseudocholinesterase deficiency - Trouble waking up from anesthesia   History of hiatal hernia    History of kidney stones    noted on CT Left nonobstructive    Past Surgical History:  Procedure Laterality Date   BREAST LUMPECTOMY WITH RADIOACTIVE SEED AND SENTINEL LYMPH NODE BIOPSY Left 06/04/2020   Procedure: LEFT BREAST LUMPECTOMY WITH RADIOACTIVE SEED AND SENTINEL LYMPH NODE MAPPING;  Surgeon: Erroll Luna, MD;  Location: [facility];  Service: General;  Laterality: Left;  PEC BLOCK, RNFA   FEMUR IM NAIL Right 04/01/2021   Procedure: INTRAMEDULLARY (IM) NAIL FEMORAL;  Surgeon: [name], MD;  Location: [facility];  Service: Orthopedics;  Laterality: Right;   WISDOM TOOTH EXTRACTION      Prior to Admission medications   Medication Sig Start Date End Date Taking? Authorizing Provider  amphetamine-dextroamphetamine (ADDERALL) 10 MG tablet Take 1 tablet (10 mg total) by mouth 2 (two) times daily as needed. 05/14/22  Yes   calcium carbonate (OS-CAL) 600 MG TABS tablet Take 600 mg by mouth daily with breakfast.   Yes [provider]  Multiple Vitamins-Minerals (MULTIVITAMIN  WITH MINERALS) tablet Take 1 tablet by mouth daily.   Yes [provider]  oxyCODONE-acetaminophen (PERCOCET) 10-325 MG tablet Take 1 tablet by mouth every 8 (eight) hours as needed for pain. 05/01/22  Yes Nicholas Lose, MD  tamoxifen (NOLVADEX) 20 MG tablet Take 1 tablet (20 mg total) by mouth daily. 08/05/21  Yes Nicholas Lose, MD  Vitamin D, Cholecalciferol, 25 MCG (1000 UT) TABS Take 1 tablet by mouth daily. 12/02/20  Yes Nicholas Lose, MD  acetaminophen (TYLENOL) 500 MG tablet Take 2 tablets (1,000 mg total) by mouth every 8 (eight) hours as needed for mild pain 04/02/21   Merlene Pulling K, PA-C  ALPRAZolam Duanne Moron) 0.5 MG tablet Take 1 tablet (0.5 mg total) by mouth 3 (three) times daily as needed for anxiety. Patient not taking: Reported on 05/14/2022 04/13/22   Nicholas Lose, MD  ferrous sulfate 324 MG TBEC Take 324 mg by mouth.    [provider]    Current Outpatient Medications  Medication Sig Dispense Refill   amphetamine-dextroamphetamine (ADDERALL) 10 MG tablet Take 1 tablet (10 mg total) by mouth 2 (two) times daily as needed. 60 tablet 0   calcium carbonate (OS-CAL) 600 MG TABS tablet Take 600 mg by mouth daily with breakfast.     Multiple Vitamins-Minerals (MULTIVITAMIN WITH MINERALS) tablet Take 1 tablet by mouth daily.     oxyCODONE-acetaminophen (PERCOCET) 10-325 MG tablet Take 1 tablet by mouth every 8 (eight) hours as needed for pain. 60 tablet 0   tamoxifen (NOLVADEX)  20 MG tablet Take 1 tablet (20 mg total) by mouth daily. 90 tablet 3   Vitamin D, Cholecalciferol, 25 MCG (1000 UT) TABS Take 1 tablet by mouth daily. 60 tablet    acetaminophen (TYLENOL) 500 MG tablet Take 2 tablets (1,000 mg total) by mouth every 8 (eight) hours as needed for mild pain 90 tablet 3   ALPRAZolam (XANAX) 0.5 MG tablet Take 1 tablet (0.5 mg total) by mouth 3 (three) times daily as needed for anxiety. (Patient not taking: Reported on 05/14/2022) 60 tablet 3   ferrous sulfate 324 MG TBEC  Take 324 mg by mouth.     No current facility-administered medications for this visit.    Allergies as of 06/16/2022 - Review Complete 06/16/2022  Allergen Reaction Noted   Adhesive [tape] Other (See Comments) 06/04/2020   Phesgo [pertuz-trastuz-hyaluron-zzxf] Diarrhea and Rash 06/11/2021   Tetanus toxoid Anaphylaxis 12/07/1997   Succinylcholine Other (See Comments) 03/25/2021   Contrast media [iodinated contrast media] Rash 05/22/2020    Family History  Problem Relation Age of Onset   Colon cancer Neg Hx    Esophageal cancer Neg Hx    Stomach cancer Neg Hx    Rectal cancer Neg Hx     Social History   Socioeconomic History   Marital status: Single    Spouse name: Not on file   Number of children: Not on file   Years of education: Not on file   Highest education level: Not on file  Occupational History   Not on file  Tobacco Use   Smoking status: Former    Types: Cigarettes    Quit date: 05/19/2020    Years since quitting: 2.0   Smokeless tobacco: Never  Vaping Use   Vaping Use: Never used  Substance and Sexual Activity   Alcohol use: Yes    Comment: social drinker   Drug use: Never   Sexual activity: Not Currently  Other Topics Concern   Not on file  Social History Narrative   Not on file   Social Determinants of Health   Financial Resource Strain: Not on file  Food Insecurity: Not on file  Transportation Needs: Not on file  Physical Activity: Not on file  Stress: Not on file  Social Connections: Not on file  Intimate Partner Violence: Not At Risk (05/06/2021)   Humiliation, Afraid, Rape, and Kick questionnaire    Fear of Current or Ex-Partner: No    Emotionally Abused: No    Physically Abused: No    Sexually Abused: No    Review of Systems:  All other review of systems negative except as mentioned in the HPI.  Physical Exam: Vital signs in last 24 hours: Blood Pressure 104/61   Pulse 68   Temperature (Abnormal) 97.3 F (36.3 C)   Height '5\' 5"'$   (1.651 m)   Weight 176 lb (79.8 kg)   Last Menstrual Period 05/31/2022 Comment: pt signed preg test waiver  Oxygen Saturation 98%   Body Mass Index 29.29 kg/m  General:   Alert, NAD Lungs:  Clear .   Heart:  Regular rate and rhythm Abdomen:  Soft, nontender and nondistended. Neuro/Psych:  Alert and cooperative. Normal mood and affect. A and O x 3  Reviewed labs, radiology imaging, old records and pertinent past GI work up  Patient is appropriate for planned procedure(s) and anesthesia in an ambulatory setting   K. Denzil Magnuson , MD [phone]

## 2022-06-16 NOTE — Patient Instructions (Addendum)
- Patient has a contact number available for emergencies. The signs and symptoms of potential delayed complications were discussed with the patient. Return to normal activities tomorrow. Written discharge instructions were provided to the patient. - Resume previous diet. - Continue present medications. - Await pathology results. - Repeat colonoscopy in 3 - 5 years for surveillance based on pathology results.  Handouts on diverticulosis, polyps and hemorrhoids given.  Brochure on Hemorrhoid Banding given to patient per Dr Woodward Ku verbal request.  YOU HAD AN ENDOSCOPIC PROCEDURE TODAY AT [facility]:   Refer to the procedure report that was given to you for any specific questions about what was found during the examination.  If the procedure report does not answer your questions, please call your gastroenterologist to clarify.  If you requested that your care partner not be given the details of your procedure findings, then the procedure report has been included in a sealed envelope for you to review at your convenience later.  YOU SHOULD EXPECT: Some feelings of bloating in the abdomen. Passage of more gas than usual.  Walking can help get rid of the air that was put into your GI tract during the procedure and reduce the bloating. If you had a lower endoscopy (such as a colonoscopy or flexible sigmoidoscopy) you may notice spotting of blood in your stool or on the toilet paper. If you underwent a bowel prep for your procedure, you may not have a normal bowel movement for a few days.  Please Note:  You might notice some irritation and congestion in your nose or some drainage.  This is from the oxygen used during your procedure.  There is no need for concern and it should clear up in a day or so.  SYMPTOMS TO REPORT IMMEDIATELY:  Following lower endoscopy (colonoscopy or flexible sigmoidoscopy):  Excessive amounts of blood in the stool  Significant tenderness or worsening of  abdominal pains  Swelling of the abdomen that is new, acute  Fever of 100F or higher  Following upper endoscopy (EGD)  Vomiting of blood or coffee ground material  New chest pain or pain under the shoulder blades  Painful or persistently difficult swallowing  New shortness of breath  Fever of 100F or higher  Black, tarry-looking stools  For urgent or emergent issues, a gastroenterologist can be reached at any hour by calling [phone]. Do not use MyChart messaging for urgent concerns.    DIET:  We do recommend a small meal at first, but then you may proceed to your regular diet.  Drink plenty of fluids but you should avoid alcoholic beverages for 24 hours.  ACTIVITY:  You should plan to take it easy for the rest of today and you should NOT DRIVE or use heavy machinery until tomorrow (because of the sedation medicines used during the test).    FOLLOW UP: Our staff will call the number listed on your records the next business day following your procedure.  We will call around 7:15- 8:00 am to check on you and address any questions or concerns that you may have regarding the information given to you following your procedure. If we do not reach you, we will leave a message.     If any biopsies were taken you will be contacted by phone or by letter within the next 1-3 weeks.  Please call us at [phone] if you have not heard about the biopsies in 3 weeks.    SIGNATURES/CONFIDENTIALITY: You and/or your care  partner have signed paperwork which will be entered into your electronic medical record.  These signatures attest to the fact that that the information above on your After Visit Summary has been reviewed and is understood.  Full responsibility of the confidentiality of this discharge information lies with you and/or your care-partner.  

## 2022-06-16 NOTE — Progress Notes (Signed)
Pt awake, alert and oriented. VSS. Airway intact. SBAR complete to RN. All questions answered.  

## 2022-06-16 NOTE — Op Note (Signed)
[facility] Patient Name: Natalie Griffin Procedure Date: 06/16/2022 10:55 AM MRN: [id number] Endoscopist: Mauri Pole , MD Age: 46 Referring MD:  Date of Birth: 24-Mar-1976 Gender: Female Account #: [id number] Procedure:                Colonoscopy Indications:              Screening for colorectal malignant neoplasm Medicines:                Monitored Anesthesia Care Procedure:                Pre-Anesthesia Assessment:                           - Prior to the procedure, a History and Physical                            was performed, and patient medications and                            allergies were reviewed. The patient's tolerance of                            previous anesthesia was also reviewed. The risks                            and benefits of the procedure and the sedation                            options and risks were discussed with the patient.                            All questions were answered, and informed consent                            was obtained. Prior Anticoagulants: The patient has                            taken no previous anticoagulant or antiplatelet                            agents. ASA Grade Assessment: III - A patient with                            severe systemic disease. After reviewing the risks                            and benefits, the patient was deemed in                            satisfactory condition to undergo the procedure.                           After obtaining informed consent, the colonoscope  was passed under direct vision. Throughout the                            procedure, the patient's blood pressure, pulse, and                            oxygen saturations were monitored continuously. The                            Olympus PCF-H190DL [id number]) Colonoscope was                            introduced through the anus and advanced to the the                            cecum,  identified by appendiceal orifice and                            ileocecal valve. The colonoscopy was performed                            without difficulty. The patient tolerated the                            procedure well. The quality of the bowel                            preparation was good. The ileocecal valve,                            appendiceal orifice, and rectum were photographed. Scope In: 10:59:41 AM Scope Out: 11:15:03 AM Scope Withdrawal Time: 0 hours 11 minutes 51 seconds  Total Procedure Duration: 0 hours 15 minutes 22 seconds  Findings:                 The perianal and digital rectal examinations were                            normal.                           Two sessile polyps were found in the sigmoid colon                            and cecum. The polyps were 5 to 7 mm in size. These                            polyps were removed with a cold snare. Resection                            and retrieval were complete.                           A 20 mm polyp was found in the ascending colon. The  polyp was sessile. The polyp was removed with a                            piecemeal technique using a cold snare. Resection                            and retrieval were complete.                           A few small-mouthed diverticula were found in the                            sigmoid colon and descending colon.                           Non-bleeding external and internal hemorrhoids were                            found during retroflexion. The hemorrhoids were                            medium-sized. Complications:            No immediate complications. Estimated Blood Loss:     Estimated blood loss was minimal. Impression:               - Two 5 to 7 mm polyps in the sigmoid colon and in                            the cecum, removed with a cold snare. Resected and                            retrieved.                           - One 20 mm  polyp in the ascending colon, removed                            piecemeal using a cold snare. Resected and                            retrieved.                           - Diverticulosis in the sigmoid colon and in the                            descending colon.                           - Non-bleeding external and internal hemorrhoids. Recommendation:           - Patient has a contact number available for                            emergencies. The signs and symptoms of potential  delayed complications were discussed with the                            patient. Return to normal activities tomorrow.                            Written discharge instructions were provided to the                            patient.                           - Resume previous diet.                           - Continue present medications.                           - Await pathology results.                           - Repeat colonoscopy in 3 - 5 years for                            surveillance based on pathology results. Mauri Pole, MD 06/16/2022 11:19:55 AM This report has been signed electronically.

## 2022-06-17 ENCOUNTER — Other Ambulatory Visit: Payer: Self-pay | Admitting: Hematology and Oncology

## 2022-06-17 ENCOUNTER — Telehealth: Payer: Self-pay

## 2022-06-17 DIAGNOSIS — R928 Other abnormal and inconclusive findings on diagnostic imaging of breast: Secondary | ICD-10-CM

## 2022-06-17 NOTE — Telephone Encounter (Signed)
  Follow up Call-     06/16/2022   10:23 AM  Call back number  Post procedure Call Back phone  # [phone]  Permission to leave phone message Yes     Patient questions:  Do you have a fever, pain , or abdominal swelling? No. Pain Score  0 *  Have you tolerated food without any problems? Yes.    Have you been able to return to your normal activities? Yes.    Do you have any questions about your discharge instructions: Diet   No. Medications  No. Follow up visit  No.  Do you have questions or concerns about your Care? No.  Actions: * If pain score is 4 or above: No action needed, pain <4.

## 2022-06-18 ENCOUNTER — Other Ambulatory Visit: Payer: Self-pay

## 2022-06-18 ENCOUNTER — Inpatient Hospital Stay: Payer: 59 | Attending: Hematology and Oncology

## 2022-06-18 ENCOUNTER — Inpatient Hospital Stay: Payer: 59

## 2022-06-18 ENCOUNTER — Inpatient Hospital Stay (HOSPITAL_BASED_OUTPATIENT_CLINIC_OR_DEPARTMENT_OTHER): Payer: 59 | Admitting: Hematology and Oncology

## 2022-06-18 DIAGNOSIS — Z170 Estrogen receptor positive status [ER+]: Secondary | ICD-10-CM

## 2022-06-18 DIAGNOSIS — C50212 Malignant neoplasm of upper-inner quadrant of left female breast: Secondary | ICD-10-CM | POA: Diagnosis not present

## 2022-06-18 DIAGNOSIS — C7951 Secondary malignant neoplasm of bone: Secondary | ICD-10-CM | POA: Insufficient documentation

## 2022-06-18 DIAGNOSIS — Z5112 Encounter for antineoplastic immunotherapy: Secondary | ICD-10-CM | POA: Insufficient documentation

## 2022-06-18 LAB — CBC WITH DIFFERENTIAL (CANCER CENTER ONLY)
Abs Immature Granulocytes: 0.04 10*3/uL (ref 0.00–0.07)
Basophils Absolute: 0.1 10*3/uL (ref 0.0–0.1)
Basophils Relative: 1 %
Eosinophils Absolute: 0.4 10*3/uL (ref 0.0–0.5)
Eosinophils Relative: 3 %
HCT: 34.8 % — ABNORMAL LOW (ref 36.0–46.0)
Hemoglobin: 11.7 g/dL — ABNORMAL LOW (ref 12.0–15.0)
Immature Granulocytes: 0 %
Lymphocytes Relative: 27 %
Lymphs Abs: 3.5 10*3/uL (ref 0.7–4.0)
MCH: 28.9 pg (ref 26.0–34.0)
MCHC: 33.6 g/dL (ref 30.0–36.0)
MCV: 85.9 fL (ref 80.0–100.0)
Monocytes Absolute: 0.8 10*3/uL (ref 0.1–1.0)
Monocytes Relative: 6 %
Neutro Abs: 8.2 10*3/uL — ABNORMAL HIGH (ref 1.7–7.7)
Neutrophils Relative %: 63 %
Platelet Count: 299 10*3/uL (ref 150–400)
RBC: 4.05 MIL/uL (ref 3.87–5.11)
RDW: 13.9 % (ref 11.5–15.5)
WBC Count: 13 10*3/uL — ABNORMAL HIGH (ref 4.0–10.5)
nRBC: 0 % (ref 0.0–0.2)

## 2022-06-18 LAB — CMP (CANCER CENTER ONLY)
ALT: 27 U/L (ref 0–44)
AST: 33 U/L (ref 15–41)
Albumin: 4.5 g/dL (ref 3.5–5.0)
Alkaline Phosphatase: 72 U/L (ref 38–126)
Anion gap: 13 (ref 5–15)
BUN: 11 mg/dL (ref 6–20)
CO2: 23 mmol/L (ref 22–32)
Calcium: 10.1 mg/dL (ref 8.9–10.3)
Chloride: 103 mmol/L (ref 98–111)
Creatinine: 0.78 mg/dL (ref 0.44–1.00)
GFR, Estimated: >60 mL/min (ref >60)
Glucose, Bld: 121 mg/dL — ABNORMAL HIGH (ref 70–99)
Potassium: 4.1 mmol/L (ref 3.5–5.1)
Sodium: 139 mmol/L (ref 135–145)
Total Bilirubin: 0.4 mg/dL (ref 0.3–1.2)
Total Protein: 8 g/dL (ref 6.5–8.1)

## 2022-06-18 MED ORDER — TRASTUZUMAB-HYALURONIDASE-OYSK 600-10000 MG-UNT/5ML ~~LOC~~ SOLN
600.0000 mg | Freq: Once | SUBCUTANEOUS | Status: AC
  Administered 2022-06-18: 600 mg via SUBCUTANEOUS
  Filled 2022-06-18: qty 5

## 2022-06-18 NOTE — Patient Instructions (Signed)
[facility]  Discharge Instructions: Thank you for choosing [facility] to provide your oncology and hematology care.   If you have a lab appointment with the Cancer Center, please go directly to the Cancer Center and check in at the registration area.   Wear comfortable clothing and clothing appropriate for easy access to any Portacath or PICC line.   We strive to give you quality time with your provider. You may need to reschedule your appointment if you arrive late (15 or more minutes).  Arriving late affects you and other patients whose appointments are after yours.  Also, if you miss three or more appointments without notifying the office, you may be dismissed from the clinic at the provider's discretion.      For prescription refill requests, have your pharmacy contact our office and allow 72 hours for refills to be completed.    Today you received the following chemotherapy and/or immunotherapy agents: herceptin hylecta      To help prevent nausea and vomiting after your treatment, we encourage you to take your nausea medication as directed.  BELOW ARE SYMPTOMS THAT SHOULD BE REPORTED IMMEDIATELY: *FEVER GREATER THAN 100.4 F (38 C) OR HIGHER *CHILLS OR SWEATING *NAUSEA AND VOMITING THAT IS NOT CONTROLLED WITH YOUR NAUSEA MEDICATION *UNUSUAL SHORTNESS OF BREATH *UNUSUAL BRUISING OR BLEEDING *URINARY PROBLEMS (pain or burning when urinating, or frequent urination) *BOWEL PROBLEMS (unusual diarrhea, constipation, pain near the anus) TENDERNESS IN MOUTH AND THROAT WITH OR WITHOUT PRESENCE OF ULCERS (sore throat, sores in mouth, or a toothache) UNUSUAL RASH, SWELLING OR PAIN  UNUSUAL VAGINAL DISCHARGE OR ITCHING   Items with * indicate a potential emergency and should be followed up as soon as possible or go to the Emergency Department if any problems should occur.  Please show the CHEMOTHERAPY ALERT CARD or IMMUNOTHERAPY ALERT CARD at  check-in to the Emergency Department and triage nurse.  Should you have questions after your visit or need to cancel or reschedule your appointment, please contact [facility]  Dept: [phone]  and follow the prompts.  Office hours are 8:00 a.m. to 4:30 p.m. Monday - Friday. Please note that voicemails left after 4:00 p.m. may not be returned until the following business day.  We are closed weekends and major holidays. You have access to a nurse at all times for urgent questions. Please call the main number to the clinic Dept: [phone] and follow the prompts.   For any non-urgent questions, you may also contact your provider using MyChart. We now offer e-Visits for anyone 18 and older to request care online for non-urgent symptoms. For details visit [email or web address].   Also download the MyChart app! Go to the app store, search "MyChart", open the app, select [facility], and log in with your MyChart username and password.  Masks are optional in the cancer centers. If you would like for your care team to wear a mask while they are taking care of you, please let them know. You may have one support person who is at least 47 years old accompany you for your appointments. 

## 2022-06-18 NOTE — Assessment & Plan Note (Addendum)
22/2021:Patient palpated a left breast lump. Mammogram on 03/08/20 showed a 3.0cm mass at the 11 o'clock position with no axillary adenopathy. 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 Patient refused adjuvant chemo, adjuvant radiation and adjuvant antiestrogen therapy.  Patient went to orthopedics for right hip pain. Imaging revealed bone metastasis. CT CAP: Cortical destruction of the right femoral neck consistent with skeletal metastases at risk for pathologic fracture. Lucent lesion in the left iliac bone and L3 vertebral body consistent with multifocal skeletal metastases. --------------------------------------------------------------- 04/01/2021: Femoral intramedullary nail Pathology review: Metastatic breast cancer ER 2% PR 0% HER2 3+ positive  Treatment plan: Tamoxifen 20 mgwas prescribed on7/19/2022(discontinued by patient because she felt that the risks and benefits did not support it),subcutaneous Herceptin Perjeta(Phesgo)injection started8/24/2022, switched to Herceptin Hylecta 06/11/2021 (Fixed drug eruption, profound diarrheato Phesgo)  Palliative radiation to the iliac bone and hip:05/20/2021-06/03/2021 Resumed tamoxifen. She is continuing tamoxifen with manageable side effects.  Mid back pain: MRI thoracic spine 02/04/2022: Numerous bone metastases thoracic spine cervical and lumbar spines largest T5, T8, T11. 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  CT chest abdomen pelvis 05/07/2022: No new or progressive bone metastases.  Stable lytic changes Return to clinic every 4 weeks for Herceptin and every 8 weeks for  follow-up with me.

## 2022-06-22 ENCOUNTER — Other Ambulatory Visit (HOSPITAL_COMMUNITY): Payer: Self-pay

## 2022-06-22 ENCOUNTER — Other Ambulatory Visit: Payer: Self-pay | Admitting: Hematology and Oncology

## 2022-06-22 MED ORDER — OXYCODONE-ACETAMINOPHEN 10-325 MG PO TABS
1.0000 | ORAL_TABLET | Freq: Three times a day (TID) | ORAL | 0 refills | Status: DC | PRN
  Filled 2022-06-22: qty 60, 20d supply, fill #0

## 2022-06-23 ENCOUNTER — Other Ambulatory Visit (HOSPITAL_COMMUNITY): Payer: Self-pay

## 2022-06-25 ENCOUNTER — Encounter: Payer: Self-pay | Admitting: Gastroenterology

## 2022-06-30 ENCOUNTER — Other Ambulatory Visit: Payer: 59

## 2022-06-30 ENCOUNTER — Ambulatory Visit: Payer: 59 | Admitting: Hematology and Oncology

## 2022-06-30 ENCOUNTER — Ambulatory Visit: Payer: 59

## 2022-07-02 ENCOUNTER — Other Ambulatory Visit: Payer: 59

## 2022-07-02 ENCOUNTER — Ambulatory Visit: Payer: 59

## 2022-07-08 ENCOUNTER — Inpatient Hospital Stay: Payer: 59

## 2022-07-09 ENCOUNTER — Inpatient Hospital Stay: Payer: 59

## 2022-07-10 ENCOUNTER — Other Ambulatory Visit: Payer: Self-pay | Admitting: Hematology and Oncology

## 2022-07-10 DIAGNOSIS — Z9889 Other specified postprocedural states: Secondary | ICD-10-CM

## 2022-07-14 ENCOUNTER — Encounter: Payer: Self-pay | Admitting: Hematology and Oncology

## 2022-07-15 ENCOUNTER — Other Ambulatory Visit: Payer: Self-pay | Admitting: *Deleted

## 2022-07-15 ENCOUNTER — Other Ambulatory Visit: Payer: Self-pay | Admitting: Hematology and Oncology

## 2022-07-15 ENCOUNTER — Ambulatory Visit
Admission: RE | Admit: 2022-07-15 | Discharge: 2022-07-15 | Disposition: A | Discharge status: Home / Self Care | Payer: 59 | Source: Ambulatory Visit | Attending: Hematology and Oncology | Admitting: Hematology and Oncology

## 2022-07-15 ENCOUNTER — Encounter: Payer: Self-pay | Admitting: Hematology and Oncology

## 2022-07-15 DIAGNOSIS — Z853 Personal history of malignant neoplasm of breast: Secondary | ICD-10-CM | POA: Diagnosis not present

## 2022-07-15 DIAGNOSIS — N6310 Unspecified lump in the right breast, unspecified quadrant: Secondary | ICD-10-CM

## 2022-07-15 DIAGNOSIS — N6311 Unspecified lump in the right breast, upper outer quadrant: Secondary | ICD-10-CM | POA: Diagnosis not present

## 2022-07-15 DIAGNOSIS — C50212 Malignant neoplasm of upper-inner quadrant of left female breast: Secondary | ICD-10-CM

## 2022-07-15 DIAGNOSIS — Z9889 Other specified postprocedural states: Secondary | ICD-10-CM

## 2022-07-15 DIAGNOSIS — R92333 Mammographic heterogeneous density, bilateral breasts: Secondary | ICD-10-CM | POA: Diagnosis not present

## 2022-07-16 ENCOUNTER — Inpatient Hospital Stay: Payer: 59 | Attending: Hematology and Oncology

## 2022-07-16 ENCOUNTER — Other Ambulatory Visit (HOSPITAL_COMMUNITY): Payer: Self-pay

## 2022-07-16 ENCOUNTER — Ambulatory Visit (HOSPITAL_COMMUNITY)
Admission: RE | Admit: 2022-07-16 | Discharge: 2022-07-16 | Disposition: A | Discharge status: Home / Self Care | Payer: 59 | Source: Ambulatory Visit | Attending: Hematology and Oncology | Admitting: Hematology and Oncology

## 2022-07-16 ENCOUNTER — Inpatient Hospital Stay (HOSPITAL_BASED_OUTPATIENT_CLINIC_OR_DEPARTMENT_OTHER): Payer: 59 | Admitting: Physician Assistant

## 2022-07-16 ENCOUNTER — Other Ambulatory Visit: Payer: Self-pay | Admitting: Hematology and Oncology

## 2022-07-16 ENCOUNTER — Other Ambulatory Visit: Payer: Self-pay

## 2022-07-16 VITALS — BP 129/86 | HR 69 | Temp 98.2°F | Resp 18 | Wt 168.1 lb

## 2022-07-16 DIAGNOSIS — N6310 Unspecified lump in the right breast, unspecified quadrant: Secondary | ICD-10-CM

## 2022-07-16 DIAGNOSIS — C50212 Malignant neoplasm of upper-inner quadrant of left female breast: Secondary | ICD-10-CM | POA: Insufficient documentation

## 2022-07-16 DIAGNOSIS — M549 Dorsalgia, unspecified: Secondary | ICD-10-CM | POA: Insufficient documentation

## 2022-07-16 DIAGNOSIS — S3992XA Unspecified injury of lower back, initial encounter: Secondary | ICD-10-CM | POA: Diagnosis not present

## 2022-07-16 DIAGNOSIS — Z170 Estrogen receptor positive status [ER+]: Secondary | ICD-10-CM

## 2022-07-16 DIAGNOSIS — C7951 Secondary malignant neoplasm of bone: Secondary | ICD-10-CM | POA: Diagnosis not present

## 2022-07-16 DIAGNOSIS — Z5112 Encounter for antineoplastic immunotherapy: Secondary | ICD-10-CM | POA: Diagnosis present

## 2022-07-16 DIAGNOSIS — M5136 Other intervertebral disc degeneration, lumbar region: Secondary | ICD-10-CM | POA: Diagnosis not present

## 2022-07-16 DIAGNOSIS — S199XXA Unspecified injury of neck, initial encounter: Secondary | ICD-10-CM | POA: Diagnosis not present

## 2022-07-16 DIAGNOSIS — M542 Cervicalgia: Secondary | ICD-10-CM | POA: Diagnosis not present

## 2022-07-16 DIAGNOSIS — M5030 Other cervical disc degeneration, unspecified cervical region: Secondary | ICD-10-CM | POA: Diagnosis not present

## 2022-07-16 MED ORDER — TRASTUZUMAB-HYALURONIDASE-OYSK 600-10000 MG-UNT/5ML ~~LOC~~ SOLN
600.0000 mg | Freq: Once | SUBCUTANEOUS | Status: AC
  Administered 2022-07-16: 600 mg via SUBCUTANEOUS
  Filled 2022-07-16: qty 5

## 2022-07-16 MED ORDER — TIZANIDINE HCL 4 MG PO TABS
4.0000 mg | ORAL_TABLET | Freq: Three times a day (TID) | ORAL | 0 refills | Status: DC | PRN
  Filled 2022-07-16: qty 30, 10d supply, fill #0

## 2022-07-16 NOTE — Progress Notes (Signed)
Symptom Management Consult note [place]    Patient Care Team: Sueanne Margarita, DO as PCP - General (Internal Medicine) Rockwell Germany, RN as Oncology Nurse Navigator Mauro Kaufmann, RN as Oncology Nurse Navigator    Name of the patient: Natalie Griffin  [id number]  1975-10-23   Date of visit: 07/16/2022   Chief Complaint/Reason for visit: back pain   Current Therapy: Herceptin Hylecta   Last treatment:  Day 1   Cycle 18 on 07/16/22   ASSESSMENT & PLAN: Patient is a 46 y.o. female  with oncologic history of Malignant neoplasm of upper-inner quadrant of left breast, estrogen receptor positive  followed by Dr. Lindi Adie.  I have viewed most recent oncology note and lab work.    #) Malignant neoplasm of upper-inner quadrant of left breast, estrogen receptor positive  - Here for treatment today - Next appointment with oncologist is 08/12/22   #) Back pain -chart review showing MR thoracic spine 02/03/22 with chronic compression fractures at the T4, T5, T10 and T12 levels as well as metasases throughout cervical, lumbar and thoracic spine. -Xray of cervical and lumbar spine ordered by oncologist prior to clinic visit. -Xray of cervical spine showing no acute fracture. Does have mild degenerative disc disease. Xray of lumbar spine showing mets to L4 and bilateral ilia consistent with known mets. I viewed imaging and agree with radiologist opinion. -Pain is reproducible on exam. No bony abnormality palpated. She is neurologically intact.  -Discussed possibility of occult fracture not seen on xray. Pain is suggestive of MSK strain vs pinched nerve. Engaged in shared decision making with patient and she is agreeable with muscle relaxer and holding off on steroids at this time while on treatment. Further discussed symptomatic management including heat,lidocaine patches, salon pas cream. Patient avoids NSAIDs so will take tylenol for pain with percocet for break through  pain which she already has prescription for. -Discussed the role of radiation therapy for bony mets which patient refuses at this time. -Work note provided at patient's request.   Strict ED precautions discussed should symptoms worsen.     Heme/Onc History: Oncology History  Malignant neoplasm of upper-inner quadrant of left breast in female, estrogen receptor positive ([place])  04/11/2020 Initial Diagnosis   Patient palpated a left breast lump. Mammogram on 03/08/20 showed a 3.0cm mass at the 11 o'clock position with no axillary adenopathy. 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   05/09/2020 Cancer Staging   Staging form: Breast, AJCC 8th Edition - Clinical stage from 05/09/2020: Stage IB (cT2, cN0, cM0, G2, ER+, PR+, HER2+) - Signed by Nicholas Lose, MD on 05/09/2020   06/04/2020 Surgery   Left lumpectomy ([name]): invasive and in situ ductal carcinoma, 3.2cm, clear margins, one left axillary lymph node negative for carcinoma.    02/2021 Relapse/Recurrence   Patient went to orthopedics for right hip pain.  Imaging revealed bone metastasis.  CT CAP: Cortical destruction of the right femoral neck consistent with skeletal metastases at risk for pathologic fracture.  Lucent lesion in the left iliac bone and L3 vertebral body consistent with multifocal skeletal metastases.   06/11/2021 -  Chemotherapy   Patient is on Treatment Plan : BREAST Trastuzumab SQ (Hylecta) q21d         Interval history-: Natalie Griffin is a 46 y.o. female with oncologic history as above presenting to [facility] today with chief complaint of  back pain x4 days.  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  When she got off work Monday morning she noticed sharp throbbing pain located in the base of her neck.  Pain does not radiate.  Pain is worse with movement.  She  tried applying ice and heat and thinks ice helped the most.  She has been taking half a Percocet at night to help with pain which has been allowing her to get a full night of sleep.  She states when she picked up a frying pan with her left hand it made the pain worse causing her to drop it.  She denies numbness or tingling.  She denies any urinary or bowel retention or incontinence.      ROS  All other systems are reviewed and are negative for acute change except as noted in the HPI.    Allergies  Allergen Reactions   Adhesive [Tape] Other (See Comments)    Blistering with steri strips   Phesgo [Pertuz-Trastuz-Hyaluron-Zzxf] Diarrhea and Rash    Phesgo side effect: Fixed drug eruption, profound diarrhea Therefore we will discontinue Phesgo and start her on Herceptin Hylecta Inj   Tetanus Toxoid Anaphylaxis   Succinylcholine Other (See Comments)    Her Body does not have enough enzymes to carry it out of her body   Contrast Media [Iodinated Contrast Media] Rash    Rash/hives     Past Medical History:  Diagnosis Date   Anemia    Bone metastasis    Breast cancer ([place]) 2021   Left breast invasive ductal carcinoma   Cancer ([place]) 2021   Left breast   Family history of adverse reaction to anesthesia    Father and Aunt have pseudocholinesterase deficiency - Trouble waking up from anesthesia   History of hiatal hernia    History of kidney stones    noted on CT Left nonobstructive     Past Surgical History:  Procedure Laterality Date   BREAST LUMPECTOMY WITH RADIOACTIVE SEED AND SENTINEL LYMPH NODE BIOPSY Left 06/04/2020   Procedure: LEFT BREAST LUMPECTOMY WITH RADIOACTIVE SEED AND SENTINEL LYMPH NODE MAPPING;  Surgeon: Erroll Luna, MD;  Location: [facility];  Service: General;  Laterality: Left;  PEC BLOCK, RNFA   FEMUR IM NAIL Right 04/01/2021   Procedure: INTRAMEDULLARY (IM) NAIL FEMORAL;  Surgeon: [name], MD;  Location: [facility];  Service: Orthopedics;  Laterality:  Right;   WISDOM TOOTH EXTRACTION      Social History   Socioeconomic History   Marital status: Single    Spouse name: Not on file   Number of children: Not on file   Years of education: Not on file   Highest education level: Not on file  Occupational History   Not on file  Tobacco Use   Smoking status: Former    Types: Cigarettes    Quit date: 05/19/2020    Years since quitting: 2.1   Smokeless tobacco: Never  Vaping Use   Vaping Use: Never used  Substance and Sexual Activity   Alcohol use: Yes    Comment: social drinker   Drug use: Never   Sexual activity: Not Currently  Other Topics Concern   Not on file  Social History Narrative   Not on file   Social Determinants of Health   Financial Resource Strain: Not on file  Food Insecurity: Not on file  Transportation Needs:  Not on file  Physical Activity: Not on file  Stress: Not on file  Social Connections: Not on file  Intimate Partner Violence: Not At Risk (05/06/2021)   Humiliation, Afraid, Rape, and Kick questionnaire    Fear of Current or Ex-Partner: No    Emotionally Abused: No    Physically Abused: No    Sexually Abused: No    Family History  Problem Relation Age of Onset   Colon cancer Neg Hx    Esophageal cancer Neg Hx    Stomach cancer Neg Hx    Rectal cancer Neg Hx      Current Outpatient Medications:    tiZANidine (ZANAFLEX) 4 MG tablet, Take 1 tablet (4 mg total) by mouth every 8 (eight) hours as needed for muscle spasms., Disp: 30 tablet, Rfl: 0   acetaminophen (TYLENOL) 500 MG tablet, Take 2 tablets (1,000 mg total) by mouth every 8 (eight) hours as needed for mild pain, Disp: 90 tablet, Rfl: 3   ALPRAZolam (XANAX) 0.5 MG tablet, Take 1 tablet (0.5 mg total) by mouth 3 (three) times daily as needed for anxiety. (Patient not taking: Reported on 05/14/2022), Disp: 60 tablet, Rfl: 3   amphetamine-dextroamphetamine (ADDERALL) 10 MG tablet, Take 1 tablet (10 mg total) by mouth 2 (two) times daily as  needed., Disp: 60 tablet, Rfl: 0   calcium carbonate (OS-CAL) 600 MG TABS tablet, Take 600 mg by mouth daily with breakfast., Disp: , Rfl:    ferrous sulfate 324 MG TBEC, Take 324 mg by mouth., Disp: , Rfl:    Multiple Vitamins-Minerals (MULTIVITAMIN WITH MINERALS) tablet, Take 1 tablet by mouth daily., Disp: , Rfl:    oxyCODONE-acetaminophen (PERCOCET) 10-325 MG tablet, Take 1 tablet by mouth every 8 (eight) hours as needed for pain., Disp: 60 tablet, Rfl: 0   tamoxifen (NOLVADEX) 20 MG tablet, Take 1 tablet (20 mg total) by mouth daily., Disp: 90 tablet, Rfl: 3   Vitamin D, Cholecalciferol, 25 MCG (1000 UT) TABS, Take 1 tablet by mouth daily., Disp: 60 tablet, Rfl:   PHYSICAL EXAM: ECOG FS:1 - Symptomatic but completely ambulatory    Vitals:   07/16/22 1507  BP: 129/86  Pulse: 69  Resp: 18  Temp: 98.2 F (36.8 C)  TempSrc: Oral  SpO2: 96%  Weight: 168 lb 2 oz (76.3 kg)   Physical Exam Vitals and nursing note reviewed.  Constitutional:      Appearance: She is well-developed. She is not ill-appearing or toxic-appearing.  HENT:     Head: Normocephalic.     Nose: Nose normal.  Eyes:     Conjunctiva/sclera: Conjunctivae normal.  Neck:     Vascular: No JVD.      Comments: Pain when rotating head side to side Cardiovascular:     Rate and Rhythm: Normal rate and regular rhythm.     Pulses: Normal pulses.          Radial pulses are 2+ on the right side and 2+ on the left side.  Pulmonary:     Effort: Pulmonary effort is normal.  Abdominal:     General: There is no distension.  Musculoskeletal:     Cervical back: Normal range of motion.  Skin:    General: Skin is warm and dry.     Capillary Refill: Capillary refill takes less than 2 seconds.  Neurological:     Mental Status: She is oriented to person, place, and time.     Comments: Speech is clear and goal oriented, follows commands  Normal strength in upper and lower extremities bilaterally including dorsiflexion and  plantar flexion, strong and equal grip strength Sensation normal to light and sharp touch Moves extremities without ataxia, coordination intact Normal finger to nose and rapid alternating movements Normal gait and balance          LABORATORY DATA: I have reviewed the data as listed    Latest Ref Rng & Units 06/18/2022    1:20 PM 04/15/2022    1:49 PM 02/10/2022    2:09 PM  CBC  WBC 4.0 - 10.5 K/uL 13.0  8.9  10.3   Hemoglobin 12.0 - 15.0 g/dL 11.7  12.4  11.3   Hematocrit 36.0 - 46.0 % 34.8  37.5  33.9   Platelets 150 - 400 K/uL 299  300  322         Latest Ref Rng & Units 06/18/2022    1:20 PM 04/15/2022    1:49 PM 02/10/2022    2:09 PM  CMP  Glucose 70 - 99 mg/dL 121  114  134   BUN 6 - 20 mg/dL _0 Creatinine 0.44 - 1.00 mg/dL 0.78  0.79  0.90   Sodium 135 - 145 mmol/L 139  141  138   Potassium 3.5 - 5.1 mmol/L 4.1  4.0  3.7   Chloride 98 - 111 mmol/L 103  110  106   CO2 22 - 32 mmol/L _1 Calcium 8.9 - 10.3 mg/dL 10.1  9.6  9.8   Total Protein 6.5 - 8.1 g/dL 8.0  8.0  7.4   Total Bilirubin 0.3 - 1.2 mg/dL 0.4  0.2  0.2   Alkaline Phos 38 - 126 U/L 72  40  39   AST 15 - 41 U/L 33  31  23   ALT 0 - 44 U/L 27  32  30        RADIOGRAPHIC STUDIES (from last 24 hours if applicable) I have personally reviewed the radiological images as listed and agreed with the findings in the report. DG Cervical Spine Complete  Result Date: 07/16/2022 CLINICAL DATA:  History of bone Mets.  Recent injury with pain. EXAM: CERVICAL SPINE - COMPLETE 4+ VIEW COMPARISON:  None Available. FINDINGS: There is no evidence of cervical spine fracture or prevertebral soft tissue swelling. Alignment is normal. Multilevel degenerate disc disease with small anterior osteophytes. IMPRESSION: 1. No acute fracture or subluxation. 2. Multilevel degenerate disc disease. Electronically Signed   By: Keane Police D.O.   On: 07/16/2022 15:03   DG Lumbar Spine Complete  Result Date:  07/16/2022 CLINICAL DATA:  History of bone Mets and recent trauma. Possible fracture. EXAM: LUMBAR SPINE - COMPLETE 4+ VIEW COMPARISON:  None Available. FINDINGS: There is no evidence of lumbar spine fracture. Alignment is normal. Mild multilevel degenerate disc disease. aLytic lesions in the bilateral ilia suggesting known metastatic disease. IMPRESSION: 1. No evidence of acute fracture. 2. Lytic lesions in the L4 vertebral body and bilateral ilia suggesting known metastatic disease. Electronically Signed   By: Keane Police D.O.   On: 07/16/2022 15:01        Visit Diagnosis: 1. Neck pain   2. Malignant neoplasm of upper-inner quadrant of left breast in female, estrogen receptor positive ([place])      No orders of the defined types were placed in this encounter.   All questions were answered. The patient knows to call the clinic with any problems,  questions or concerns. No barriers to learning was detected.  I have spent a total of 30 minutes minutes of face-to-face and non-face-to-face time, preparing to see the patient, obtaining and/or reviewing separately obtained history, performing a medically appropriate examination, counseling and educating the patient, documenting clinical information in the electronic health record, and care coordination (communications with other health care professionals or caregivers).    Thank you for allowing me to participate in the care of this patient.    Barrie Folk, PA-C Department of Hematology/Oncology [facility] at [facility] Phone: [phone]  [phone]    07/16/2022 4:59 PM

## 2022-07-16 NOTE — Patient Instructions (Signed)
[facility] MEDICAL ONCOLOGY  Discharge Instructions: Thank you for choosing [facility] to provide your oncology and hematology care.   If you have a lab appointment with the Cancer Center, please go directly to the Cancer Center and check in at the registration area.   Wear comfortable clothing and clothing appropriate for easy access to any Portacath or PICC line.   We strive to give you quality time with your provider. You may need to reschedule your appointment if you arrive late (15 or more minutes).  Arriving late affects you and other patients whose appointments are after yours.  Also, if you miss three or more appointments without notifying the office, you may be dismissed from the clinic at the provider's discretion.      For prescription refill requests, have your pharmacy contact our office and allow 72 hours for refills to be completed.    Today you received the following chemotherapy and/or immunotherapy agents: Herceptin Hylecta.     To help prevent nausea and vomiting after your treatment, we encourage you to take your nausea medication as directed.  BELOW ARE SYMPTOMS THAT SHOULD BE REPORTED IMMEDIATELY: . *FEVER GREATER THAN 100.4 F (38 C) OR HIGHER . *CHILLS OR SWEATING . *NAUSEA AND VOMITING THAT IS NOT CONTROLLED WITH YOUR NAUSEA MEDICATION . *UNUSUAL SHORTNESS OF BREATH . *UNUSUAL BRUISING OR BLEEDING . *URINARY PROBLEMS (pain or burning when urinating, or frequent urination) . *BOWEL PROBLEMS (unusual diarrhea, constipation, pain near the anus) . TENDERNESS IN MOUTH AND THROAT WITH OR WITHOUT PRESENCE OF ULCERS (sore throat, sores in mouth, or a toothache) . UNUSUAL RASH, SWELLING OR PAIN  . UNUSUAL VAGINAL DISCHARGE OR ITCHING   Items with * indicate a potential emergency and should be followed up as soon as possible or go to the Emergency Department if any problems should occur.  Please show the CHEMOTHERAPY ALERT CARD or  IMMUNOTHERAPY ALERT CARD at check-in to the Emergency Department and triage nurse.  Should you have questions after your visit or need to cancel or reschedule your appointment, please contact [facility]  Dept: [phone]  and follow the prompts.  Office hours are 8:00 a.m. to 4:30 p.m. Monday - Friday. Please note that voicemails left after 4:00 p.m. may not be returned until the following business day.  We are closed weekends and major holidays. You have access to a nurse at all times for urgent questions. Please call the main number to the clinic Dept: [phone] and follow the prompts.   For any non-urgent questions, you may also contact your provider using MyChart. We now offer e-Visits for anyone 18 and older to request care online for non-urgent symptoms. For details visit [email or web address].   Also download the MyChart app! Go to the app store, search "MyChart", open the app, select [facility], and log in with your MyChart username and password.  Due to Covid, a mask is required upon entering the hospital/clinic. If you do not have a mask, one will be given to you upon arrival. For doctor visits, patients may have 1 support person aged 18 or older with them. For treatment visits, patients cannot have anyone with them due to current Covid guidelines and our immunocompromised population.   

## 2022-07-21 ENCOUNTER — Ambulatory Visit
Admission: RE | Admit: 2022-07-21 | Discharge: 2022-07-21 | Disposition: A | Discharge status: Home / Self Care | Payer: 59 | Source: Ambulatory Visit | Attending: Hematology and Oncology | Admitting: Hematology and Oncology

## 2022-07-21 DIAGNOSIS — Z170 Estrogen receptor positive status [ER+]: Secondary | ICD-10-CM | POA: Diagnosis not present

## 2022-07-21 DIAGNOSIS — C50411 Malignant neoplasm of upper-outer quadrant of right female breast: Secondary | ICD-10-CM | POA: Diagnosis not present

## 2022-07-21 DIAGNOSIS — N6310 Unspecified lump in the right breast, unspecified quadrant: Secondary | ICD-10-CM

## 2022-07-21 DIAGNOSIS — N6311 Unspecified lump in the right breast, upper outer quadrant: Secondary | ICD-10-CM | POA: Diagnosis not present

## 2022-07-21 HISTORY — PX: BREAST BIOPSY: SHX20

## 2022-07-23 ENCOUNTER — Encounter: Payer: Self-pay | Admitting: Hematology and Oncology

## 2022-07-27 ENCOUNTER — Telehealth: Payer: Self-pay | Admitting: Radiation Oncology

## 2022-07-27 ENCOUNTER — Inpatient Hospital Stay: Payer: 59 | Attending: Hematology and Oncology | Admitting: Hematology and Oncology

## 2022-07-27 ENCOUNTER — Other Ambulatory Visit (HOSPITAL_COMMUNITY): Payer: Self-pay

## 2022-07-27 VITALS — BP 126/86 | HR 101 | Temp 97.2°F | Resp 18 | Ht 65.0 in | Wt 169.0 lb

## 2022-07-27 DIAGNOSIS — Z170 Estrogen receptor positive status [ER+]: Secondary | ICD-10-CM | POA: Diagnosis not present

## 2022-07-27 DIAGNOSIS — C7951 Secondary malignant neoplasm of bone: Secondary | ICD-10-CM | POA: Insufficient documentation

## 2022-07-27 DIAGNOSIS — C50212 Malignant neoplasm of upper-inner quadrant of left female breast: Secondary | ICD-10-CM | POA: Insufficient documentation

## 2022-07-27 DIAGNOSIS — Z79899 Other long term (current) drug therapy: Secondary | ICD-10-CM | POA: Diagnosis not present

## 2022-07-27 MED ORDER — DEXAMETHASONE 2 MG PO TABS
2.0000 mg | ORAL_TABLET | Freq: Every day | ORAL | 3 refills | Status: DC
  Filled 2022-07-27: qty 30, 30d supply, fill #0
  Filled 2022-08-31: qty 30, 30d supply, fill #1

## 2022-07-27 NOTE — Assessment & Plan Note (Addendum)
22/2021:Patient palpated a left breast lump. Mammogram on 03/08/20 showed a 3.0cm mass at the 11 o'clock position with no axillary adenopathy. 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  Patient refused adjuvant chemo, adjuvant radiation and adjuvant antiestrogen therapy.   Patient went to orthopedics for right hip pain.  Imaging revealed bone metastasis.  CT CAP: Cortical destruction of the right femoral neck consistent with skeletal metastases at risk for pathologic fracture.  Lucent lesion in the left iliac bone and L3 vertebral body consistent with multifocal skeletal metastases. --------------------------------------------------------------- 04/01/2021: Femoral intramedullary nail  Pathology review: Metastatic breast cancer ER 2% PR 0% HER2 3+ positive   Treatment plan: Tamoxifen 20 mg was prescribed on 04/08/2021 (discontinued by patient because she felt that the risks and benefits did not support it), subcutaneous Herceptin Perjeta (Phesgo) injection started 05/14/2021, switched to Herceptin Hylecta 06/11/2021 (Fixed drug eruption, profound diarrhea to Phesgo)   Palliative radiation to the iliac bone and hip: 05/20/2021-06/03/2021  Resumed tamoxifen.  She is continuing tamoxifen with manageable side effects.   Mid back pain: MRI thoracic spine 02/04/2022: Numerous bone metastases thoracic spine cervical and lumbar spines largest T5, T8, T11.  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   CT chest abdomen pelvis 05/07/2022: No new or progressive bone metastases.  Stable lytic changes 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%,  HER2 3+ positive, Ki-67 35% Based on the biopsy results, we will continue with anti-HER2 therapy along with antiestrogen therapy.  Return to clinic every 4 weeks for Herceptin and every 8 weeks for follow-up with me.

## 2022-07-27 NOTE — Progress Notes (Signed)
Patient Care Team: Sueanne Margarita, DO as PCP - General (Internal Medicine) Rockwell Germany, RN as Oncology Nurse Navigator Mauro Kaufmann, RN as Oncology Nurse Navigator  DIAGNOSIS:  Encounter Diagnosis  Name Primary?   Malignant neoplasm of upper-inner quadrant of left breast in female, estrogen receptor positive ([place]) Yes    SUMMARY OF ONCOLOGIC HISTORY: Oncology History  Malignant neoplasm of upper-inner quadrant of left breast in female, estrogen receptor positive ([place])  04/11/2020 Initial Diagnosis   Patient palpated a left breast lump. Mammogram on 03/08/20 showed a 3.0cm mass at the 11 o'clock position with no axillary adenopathy. 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   05/09/2020 Cancer Staging   Staging form: Breast, AJCC 8th Edition - Clinical stage from 05/09/2020: Stage IB (cT2, cN0, cM0, G2, ER+, PR+, HER2+) - Signed by Nicholas Lose, MD on 05/09/2020   06/04/2020 Surgery   Left lumpectomy ([name]): invasive and in situ ductal carcinoma, 3.2cm, clear margins, one left axillary lymph node negative for carcinoma.    02/2021 Relapse/Recurrence   Patient went to orthopedics for right hip pain.  Imaging revealed bone metastasis.  CT CAP: Cortical destruction of the right femoral neck consistent with skeletal metastases at risk for pathologic fracture.  Lucent lesion in the left iliac bone and L3 vertebral body consistent with multifocal skeletal metastases.   06/11/2021 -  Chemotherapy   Patient is on Treatment Plan : BREAST Trastuzumab SQ (Hylecta) q21d       CHIEF COMPLIANT: Follow-up to discuss results of breast biopsy  INTERVAL HISTORY: Jacqueline A Biffle is a 46 year old with above-mentioned history of left breast cancer who underwent biopsy on the right breast because of a new 1.8 cm tumor on mammogram and ultrasound.  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 She is  complaining of intense pain in the sternum to the upper back area.  She is in tears.  Every time she coughs or sneezes the pain gets excruciatingly worse.  She has had bone metastases both in the ribs as well as in the upper back area.  She does not like taking pain medication therefore she is using the oxycodone pills very sparingly.   ALLERGIES:  is allergic to adhesive [tape], phesgo [pertuz-trastuz-hyaluron-zzxf], tetanus toxoid, succinylcholine, and contrast media [iodinated contrast media].  MEDICATIONS:  Current Outpatient Medications  Medication Sig Dispense Refill   dexamethasone (DECADRON) 2 MG tablet Take 1 tablet (2 mg total) by mouth daily. 30 tablet 3   acetaminophen (TYLENOL) 500 MG tablet Take 2 tablets (1,000 mg total) by mouth every 8 (eight) hours as needed for mild pain 90 tablet 3   ALPRAZolam (XANAX) 0.5 MG tablet Take 1 tablet (0.5 mg total) by mouth 3 (three) times daily as needed for anxiety. (Patient not taking: Reported on 05/14/2022) 60 tablet 3   amphetamine-dextroamphetamine (ADDERALL) 10 MG tablet Take 1 tablet (10 mg total) by mouth 2 (two) times daily as needed. 60 tablet 0   calcium carbonate (OS-CAL) 600 MG TABS tablet Take 600 mg by mouth daily with breakfast.     ferrous sulfate 324 MG TBEC Take 324 mg by mouth.     Multiple Vitamins-Minerals (MULTIVITAMIN WITH MINERALS) tablet Take 1 tablet by mouth daily.     oxyCODONE-acetaminophen (PERCOCET) 10-325 MG tablet Take 1 tablet by mouth every 8 (eight) hours as needed for pain. 60 tablet 0   tamoxifen (NOLVADEX) 20 MG  tablet Take 1 tablet (20 mg total) by mouth daily. 90 tablet 3   tiZANidine (ZANAFLEX) 4 MG tablet Take 1 tablet (4 mg total) by mouth every 8 (eight) hours as needed for muscle spasms. 30 tablet 0   Vitamin D, Cholecalciferol, 25 MCG (1000 UT) TABS Take 1 tablet by mouth daily. 60 tablet    No current facility-administered medications for this visit.    PHYSICAL EXAMINATION: ECOG PERFORMANCE  STATUS: 1 - Symptomatic but completely ambulatory  Vitals:   07/27/22 1431  BP: 126/86  Pulse: (!) 101  Resp: 18  Temp: (!) 97.2 F (36.2 C)  SpO2: 97%   Filed Weights   07/27/22 1431  Weight: 169 lb (76.7 kg)     LABORATORY DATA:  I have reviewed the data as listed    Latest Ref Rng & Units 06/18/2022    1:20 PM 04/15/2022    1:49 PM 02/10/2022    2:09 PM  CMP  Glucose 70 - 99 mg/dL 121  114  134   BUN 6 - 20 mg/dL _0 Creatinine 0.44 - 1.00 mg/dL 0.78  0.79  0.90   Sodium 135 - 145 mmol/L 139  141  138   Potassium 3.5 - 5.1 mmol/L 4.1  4.0  3.7   Chloride 98 - 111 mmol/L 103  110  106   CO2 22 - 32 mmol/L _1 Calcium 8.9 - 10.3 mg/dL 10.1  9.6  9.8   Total Protein 6.5 - 8.1 g/dL 8.0  8.0  7.4   Total Bilirubin 0.3 - 1.2 mg/dL 0.4  0.2  0.2   Alkaline Phos 38 - 126 U/L 72  40  39   AST 15 - 41 U/L 33  31  23   ALT 0 - 44 U/L 27  32  30     Lab Results  Component Value Date   WBC 13.0 (H) 06/18/2022   HGB 11.7 (L) 06/18/2022   HCT 34.8 (L) 06/18/2022   MCV 85.9 06/18/2022   PLT 299 06/18/2022   NEUTROABS 8.2 (H) 06/18/2022    ASSESSMENT & PLAN:  Malignant neoplasm of upper-inner quadrant of left breast in female, estrogen receptor positive ([place]) 22/2021:Patient palpated a left breast lump. Mammogram on 03/08/20 showed a 3.0cm mass at the 11 o'clock position with no axillary adenopathy. 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  Patient refused adjuvant chemo, adjuvant radiation and adjuvant antiestrogen therapy.   Patient went to orthopedics for right hip pain.  Imaging revealed bone metastasis.  CT CAP: Cortical destruction of the right femoral neck consistent with skeletal metastases at risk for pathologic fracture.   Lucent lesion in the left iliac bone and L3 vertebral body consistent with multifocal skeletal metastases. --------------------------------------------------------------- 04/01/2021: Femoral intramedullary nail  Pathology review: Metastatic breast cancer ER 2% PR 0% HER2 3+ positive   Treatment plan: Tamoxifen 20 mg was prescribed on 04/08/2021 (discontinued by patient because she felt that the risks and benefits did not support it), subcutaneous Herceptin Perjeta (Phesgo) injection started 05/14/2021, switched to Herceptin Hylecta 06/11/2021 (Fixed drug eruption, profound diarrhea to Phesgo)   Palliative radiation to the iliac bone and hip: 05/20/2021-06/03/2021     Mid back pain: MRI thoracic spine 02/04/2022:  Numerous bone metastases thoracic spine cervical and lumbar spines largest T5, T8, T11.  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   CT chest abdomen pelvis 05/07/2022: No new or progressive bone metastases.  Stable lytic changes 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%, HER2 3+ positive, Ki-67 35% Based on the biopsy results, we will continue with anti-HER2 therapy along with antiestrogen therapy.  Treatment plan change: I recommend oophorectomy: Because currently patient is on Zoladex and it does not appear to be stopping the cancer growth (new tumor in the right breast). Switch treatment from tamoxifen to anastrozole Pain control: Encouraged her to take the oxycodone more often.  I will send a new prescription for this.  I have also added dexamethasone 2 mg a day to decrease any inflammation related to the pain.  I called Dr. Lisbeth Renshaw who is willing to see the patient tomorrow to talk about palliative radiation.  Return to clinic every 4 weeks for Herceptin and every 8 weeks for follow-up with me.   No orders of the defined types were placed  in this encounter.  The patient has a good understanding of the overall plan. she agrees with it. she will call with any problems that may develop before the next visit here. Total time spent: 30 mins including face to face time and time spent for planning, charting and co-ordination of care   Harriette Ohara, MD 07/27/22

## 2022-07-27 NOTE — Telephone Encounter (Signed)
11/6 @ 4:05 pm Left voicemail for patient to call our office to be schedule for consult.

## 2022-07-28 ENCOUNTER — Ambulatory Visit
Admission: RE | Admit: 2022-07-28 | Discharge: 2022-07-28 | Disposition: A | Discharge status: Home / Self Care | Payer: 59 | Source: Ambulatory Visit | Attending: Radiation Oncology | Admitting: Radiation Oncology

## 2022-07-28 ENCOUNTER — Ambulatory Visit: Payer: 59 | Admitting: Hematology and Oncology

## 2022-07-28 ENCOUNTER — Ambulatory Visit: Payer: 59 | Admitting: Radiation Oncology

## 2022-07-28 ENCOUNTER — Telehealth: Payer: Self-pay | Admitting: *Deleted

## 2022-07-28 ENCOUNTER — Other Ambulatory Visit: Payer: 59

## 2022-07-28 ENCOUNTER — Ambulatory Visit: Payer: 59

## 2022-07-28 ENCOUNTER — Encounter: Payer: Self-pay | Admitting: Radiation Oncology

## 2022-07-28 VITALS — BP 128/94 | HR 92 | Temp 97.7°F | Resp 20 | Ht 65.0 in | Wt 169.6 lb

## 2022-07-28 DIAGNOSIS — C7951 Secondary malignant neoplasm of bone: Secondary | ICD-10-CM | POA: Diagnosis not present

## 2022-07-28 DIAGNOSIS — Z171 Estrogen receptor negative status [ER-]: Secondary | ICD-10-CM | POA: Diagnosis not present

## 2022-07-28 DIAGNOSIS — C50912 Malignant neoplasm of unspecified site of left female breast: Secondary | ICD-10-CM | POA: Diagnosis not present

## 2022-07-28 DIAGNOSIS — Z87442 Personal history of urinary calculi: Secondary | ICD-10-CM | POA: Insufficient documentation

## 2022-07-28 DIAGNOSIS — G893 Neoplasm related pain (acute) (chronic): Secondary | ICD-10-CM | POA: Diagnosis not present

## 2022-07-28 DIAGNOSIS — Z87891 Personal history of nicotine dependence: Secondary | ICD-10-CM | POA: Insufficient documentation

## 2022-07-28 DIAGNOSIS — Z7952 Long term (current) use of systemic steroids: Secondary | ICD-10-CM | POA: Diagnosis not present

## 2022-07-28 DIAGNOSIS — C50911 Malignant neoplasm of unspecified site of right female breast: Secondary | ICD-10-CM | POA: Diagnosis not present

## 2022-07-28 DIAGNOSIS — K449 Diaphragmatic hernia without obstruction or gangrene: Secondary | ICD-10-CM | POA: Diagnosis not present

## 2022-07-28 NOTE — Addendum Note (Signed)
Encounter addended by: Hayden Pedro, PA-C on: 07/28/2022 3:11 PM  Actions taken: Clinical Note Signed, Level of Service modified

## 2022-07-28 NOTE — Telephone Encounter (Signed)
Called patient to inform of Pet Scan for 07-29-22- arrival time- 1 pm @ [facility], patient to have water only- 6 hrs. prior to test, lvm for a return call

## 2022-07-28 NOTE — Progress Notes (Signed)
Radiation Oncology         ([phone]) [phone] ________________________________  Name: Natalie Griffin        MRN: [id number]  Date of Service: 07/28/2022 DOB: Jan 24, 1976  [id number], Liane Comber, DO  Nicholas Lose, MD     REFERRING PHYSICIAN: Nicholas Lose, MD   DIAGNOSIS: The encounter diagnosis was Malignant neoplasm metastatic to bone [facility]).   HISTORY OF PRESENT ILLNESS: Natalie Griffin is a 46 y.o. female seen at the request of Dr. 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  She underwent left lumpectomy and sentinel lymph node biopsy in September 2021, she declined adjuvant chemotherapy, radiation, and adjuvant antiestrogen therapy.    In 2022, a destructive lesion in the right femur along the neck of concern for risk of pathologic fracture lucency in the left iliac bone and L3 vertebral body were also seen.  She also underwent a bone scan on 03/26/2021 with activity in the right femoral neck, left iliac bone, cervicothoracic junction with faint increased uptake along the lateral aspect of the L3 and small lesions on MRI pelvis that had also been performed did not have definitive uptake, degenerative changes were seen in the knees and shoulders bilaterally.  An MRI angio was performed and was negative of the brain on 03/26/2021 as well as MRI brain without contrast.  She was seen by Dr. Percell Miller in orthopedics and underwent right intramedullary nail placement on 04/01/2021. She then completed a palliative course of radiotherapy to the right femur.   She has been receiving anti HER2 therapy  since August 2022. Imaging of the spine in May 2023 showed numerous lesions in the thoracic, cervical, and lumbar spine with several causing compression fractures at T4, T5, T10, and T12. She also recently had plain films of the lumbar and cervical spine on 07/16/22 without focal findings. She had a recent finding in the right breast as  well during surveillance and a 1.8 cm right breast mass at the 10:00 position has been noted and biopsied and consistent with triple positive invasive ductal carcinoma. Her case is being presented in breast oncology conference tomorrow. Due to progressive pain in her sternum and upper spine in the last month however, she's seen to discuss possible palliative radiotherapy.   PREVIOUS RADIATION THERAPY:     05/19/2021 through 06/03/2021 Site Technique Total Dose (Gy) Dose per Fx (Gy) Completed Fx Beam Energies  Femur Right: Ext_Rt Complex 30/30 3 10/10 10X     PAST MEDICAL HISTORY:  Past Medical History:  Diagnosis Date   Anemia    Bone metastasis    Breast cancer ([place]) 2021   Left breast invasive ductal carcinoma   Cancer ([place]) 2021   Left breast   Family history of adverse reaction to anesthesia    Father and Aunt have pseudocholinesterase deficiency - Trouble waking up from anesthesia   History of hiatal hernia    History of kidney stones    noted on CT Left nonobstructive       PAST SURGICAL HISTORY: Past Surgical History:  Procedure Laterality Date   BREAST LUMPECTOMY WITH RADIOACTIVE SEED AND SENTINEL LYMPH NODE BIOPSY Left 06/04/2020   Procedure: LEFT BREAST LUMPECTOMY WITH RADIOACTIVE SEED AND SENTINEL LYMPH NODE MAPPING;  Surgeon: Erroll Luna, MD;  Location: [facility];  Service: General;  Laterality: Left;  PEC BLOCK, RNFA   FEMUR IM NAIL Right 04/01/2021   Procedure: INTRAMEDULLARY (IM) NAIL FEMORAL;  Surgeon:  [name], MD;  Location: [facility];  Service: Orthopedics;  Laterality: Right;   WISDOM TOOTH EXTRACTION       FAMILY HISTORY:  Family History  Problem Relation Age of Onset   Colon cancer Neg Hx    Esophageal cancer Neg Hx    Stomach cancer Neg Hx    Rectal cancer Neg Hx      SOCIAL HISTORY:  reports that she quit smoking about 2 years ago. Her smoking use included cigarettes. She has never used smokeless tobacco. She reports current alcohol use.  She reports that she does not use drugs.  The patient is single but in a relationship and is an Astronomer at [employer].   ALLERGIES: Adhesive [tape], Phesgo [pertuz-trastuz-hyaluron-zzxf], Tetanus toxoid, Succinylcholine, and Contrast media [iodinated contrast media]   MEDICATIONS:  Current Outpatient Medications  Medication Sig Dispense Refill   acetaminophen (TYLENOL) 500 MG tablet Take 2 tablets (1,000 mg total) by mouth every 8 (eight) hours as needed for mild pain 90 tablet 3   ALPRAZolam (XANAX) 0.5 MG tablet Take 1 tablet (0.5 mg total) by mouth 3 (three) times daily as needed for anxiety. (Patient not taking: Reported on 05/14/2022) 60 tablet 3   amphetamine-dextroamphetamine (ADDERALL) 10 MG tablet Take 1 tablet (10 mg total) by mouth 2 (two) times daily as needed. 60 tablet 0   calcium carbonate (OS-CAL) 600 MG TABS tablet Take 600 mg by mouth daily with breakfast.     dexamethasone (DECADRON) 2 MG tablet Take 1 tablet (2 mg total) by mouth daily. 30 tablet 3   ferrous sulfate 324 MG TBEC Take 324 mg by mouth.     Multiple Vitamins-Minerals (MULTIVITAMIN WITH MINERALS) tablet Take 1 tablet by mouth daily.     oxyCODONE-acetaminophen (PERCOCET) 10-325 MG tablet Take 1 tablet by mouth every 8 (eight) hours as needed for pain. 60 tablet 0   tamoxifen (NOLVADEX) 20 MG tablet Take 1 tablet (20 mg total) by mouth daily. 90 tablet 3   tiZANidine (ZANAFLEX) 4 MG tablet Take 1 tablet (4 mg total) by mouth every 8 (eight) hours as needed for muscle spasms. 30 tablet 0   Vitamin D, Cholecalciferol, 25 MCG (1000 UT) TABS Take 1 tablet by mouth daily. 60 tablet    No current facility-administered medications for this encounter.     REVIEW OF SYSTEMS: On review of systems, the patient reports that she has been having progressively severe pain in her anterior mid chest along the level of her sternum as well as in the upper aspect of the thoracic region of her back. She denies any  radicular pain, loss of sensation in this area. Sleeping is very difficult because of trying to lay flat on her back for her sternum, but this worsens her back pain, and laying on her sides makes her sternal pain worse. She's recently taking benzodiazapines at night which have helped somewhat in making her less anxious when pain sets in during times of trying to get comfortable. She's taking 1/2 of a perocet tablet with minimal relief and cannot take NSAIDs per report. She is going to also try dexamethasone that was prescribed to her yesterday. No other complaints are verbalized.   PHYSICAL EXAM:  Wt Readings from Last 3 Encounters:  07/28/22 169 lb 9.6 oz (76.9 kg)  07/27/22 169 lb (76.7 kg)  07/16/22 168 lb 2 oz (76.3 kg)   Temp Readings from Last 3 Encounters:  07/28/22 97.7 F (36.5 C) (Temporal)  07/27/22 Marland Kitchen)  97.2 F (36.2 C) (Temporal)  07/16/22 98.2 F (36.8 C) (Oral)   BP Readings from Last 3 Encounters:  07/28/22 (!) 128/94  07/27/22 126/86  07/16/22 129/86   Pulse Readings from Last 3 Encounters:  07/28/22 92  07/27/22 (!) 101  07/16/22 69   Pain Assessment Pain Score: 8 /10  In general this is a well appearing Caucasian female in no acute distress.  She's alert and oriented x4 and appropriate throughout the examination. Cardiopulmonary assessment is negative for acute distress and she exhibits normal effort.     ECOG = 1  0 - Asymptomatic (Fully active, able to carry on all predisease activities without restriction)  1 - Symptomatic but completely ambulatory (Restricted in physically strenuous activity but ambulatory and able to carry out work of a light or sedentary nature. For example, light housework, office work)  2 - Symptomatic, <50% in bed during the day (Ambulatory and capable of all self care but unable to carry out any work activities. Up and about more than 50% of waking hours)  3 - Symptomatic, >50% in bed, but not bedbound (Capable of only limited  self-care, confined to bed or chair 50% or more of waking hours)  4 - Bedbound (Completely disabled. Cannot carry on any self-care. Totally confined to bed or chair)  5 - Death   Eustace Pen MM, Creech RH, Tormey DC, et al. [phone]). "Toxicity and response criteria of the [employer]". [place] Oncol. 5 (6): 649-55    LABORATORY DATA:  Lab Results  Component Value Date   WBC 13.0 (H) 06/18/2022   HGB 11.7 (L) 06/18/2022   HCT 34.8 (L) 06/18/2022   MCV 85.9 06/18/2022   PLT 299 06/18/2022   Lab Results  Component Value Date   NA 139 06/18/2022   K 4.1 06/18/2022   CL 103 06/18/2022   CO2 23 06/18/2022   Lab Results  Component Value Date   ALT 27 06/18/2022   AST 33 06/18/2022   ALKPHOS 72 06/18/2022   BILITOT 0.4 06/18/2022      RADIOGRAPHY: MM CLIP PLACEMENT RIGHT  Result Date: 07/21/2022 CLINICAL DATA:  Evaluate biopsy marker EXAM: 3D DIAGNOSTIC RIGHT MAMMOGRAM POST ULTRASOUND BIOPSY COMPARISON:  Previous exam(s). FINDINGS: 3D Mammographic images were obtained following ultrasound guided biopsy of a right breast mass. The biopsy marking clip is in expected position at the site of biopsy. IMPRESSION: Appropriate positioning of the coil shaped biopsy marking clip at the site of biopsy in the biopsied right breast mass. Final Assessment: Post Procedure Mammograms for Marker Placement Electronically Signed   By: Dorise Bullion III M.D.   On: 07/21/2022 14:50  [place] RT BREAST BX W LOC DEV 1ST LESION IMG BX SPEC US GUIDE  Result Date: 07/21/2022 CLINICAL DATA:  Ultrasound-guided biopsy of a right breast mass EXAM: ULTRASOUND GUIDED RIGHT BREAST CORE NEEDLE BIOPSY COMPARISON:  Previous exam(s). PROCEDURE: I met with the patient and we discussed the procedure of ultrasound-guided biopsy, including benefits and alternatives. We discussed the high likelihood of a successful procedure. We discussed the risks of the procedure, including infection, bleeding, tissue  injury, clip migration, and inadequate sampling. Informed written consent was given. The usual time-out protocol was performed immediately prior to the procedure. Lesion quadrant: 10 o'clock right breast Using sterile technique and 1% Lidocaine as local anesthetic, under direct ultrasound visualization, a 12 gauge spring-loaded device was used to perform biopsy of a 10 o'clock right breast mass using a lateral approach. At  the conclusion of the procedure a coil shaped tissue marker clip was deployed into the biopsy cavity. Follow up 2 view mammogram was performed and dictated separately. IMPRESSION: Ultrasound guided biopsy of a 10 o'clock right breast mass. No apparent complications. Electronically Signed   By: Dorise Bullion III M.D.   On: 07/21/2022 14:36  DG Cervical Spine Complete  Result Date: 07/16/2022 CLINICAL DATA:  History of bone Mets.  Recent injury with pain. EXAM: CERVICAL SPINE - COMPLETE 4+ VIEW COMPARISON:  None Available. FINDINGS: There is no evidence of cervical spine fracture or prevertebral soft tissue swelling. Alignment is normal. Multilevel degenerate disc disease with small anterior osteophytes. IMPRESSION: 1. No acute fracture or subluxation. 2. Multilevel degenerate disc disease. Electronically Signed   By: Keane Police D.O.   On: 07/16/2022 15:03   DG Lumbar Spine Complete  Result Date: 07/16/2022 CLINICAL DATA:  History of bone Mets and recent trauma. Possible fracture. EXAM: LUMBAR SPINE - COMPLETE 4+ VIEW COMPARISON:  None Available. FINDINGS: There is no evidence of lumbar spine fracture. Alignment is normal. Mild multilevel degenerate disc disease. aLytic lesions in the bilateral ilia suggesting known metastatic disease. IMPRESSION: 1. No evidence of acute fracture. 2. Lytic lesions in the L4 vertebral body and bilateral ilia suggesting known metastatic disease. Electronically Signed   By: Keane Police D.O.   On: 07/16/2022 15:01   MM DIAG BREAST TOMO  BILATERAL  Result Date: 07/15/2022 CLINICAL DATA:  History of LEFT breast cancer in 2021 status post lumpectomy. Patient has known osseous metastases. EXAM: DIGITAL DIAGNOSTIC BILATERAL MAMMOGRAM WITH TOMOSYNTHESIS; ULTRASOUND RIGHT BREAST LIMITED TECHNIQUE: Bilateral digital diagnostic mammography and breast tomosynthesis was performed.; Targeted ultrasound examination of the right breast was performed COMPARISON:  Previous exam(s). ACR Breast Density Category c: The breast tissue is heterogeneously dense, which may obscure small masses. FINDINGS: There are stable postsurgical changes within the LEFT breast. There are no new dominant masses, suspicious calcifications or secondary signs of malignancy within the LEFT breast. There is a new spiculated mass within the upper-outer quadrant of the RIGHT breast, at middle to posterior depth, measuring approximately 1.6 cm greatest dimension. Targeted ultrasound is performed, showing an irregular hypoechoic mass in the RIGHT breast at the 10 o'clock axis, 5 cm from the nipple, measuring 1.8 x 1.4 x 1.6 cm, corresponding to the mammographic finding. RIGHT axilla was evaluated with ultrasound showing no enlarged or morphologically abnormal lymph nodes. IMPRESSION: 1. Irregular mass in the RIGHT breast at the 10 o'clock axis, 5 cm from the nipple, measuring 1.8 cm, corresponding to the mammographic finding. 2. No evidence of malignancy within the LEFT breast. Stable postsurgical changes within the LEFT breast. RECOMMENDATION: Ultrasound-guided biopsy for the RIGHT breast mass at the 10 o'clock axis. Ultrasound-guided biopsy is scheduled on October 31st. I have discussed the findings and recommendations with the patient. If applicable, a reminder letter will be sent to the patient regarding the next appointment. BI-RADS CATEGORY  5: Highly suggestive of malignancy. Electronically Signed   By: Franki Cabot M.D.   On: 07/15/2022 16:51  US BREAST LTD UNI RIGHT INC  AXILLA  Result Date: 07/15/2022 CLINICAL DATA:  History of LEFT breast cancer in 2021 status post lumpectomy. Patient has known osseous metastases. EXAM: DIGITAL DIAGNOSTIC BILATERAL MAMMOGRAM WITH TOMOSYNTHESIS; ULTRASOUND RIGHT BREAST LIMITED TECHNIQUE: Bilateral digital diagnostic mammography and breast tomosynthesis was performed.; Targeted ultrasound examination of the right breast was performed COMPARISON:  Previous exam(s). ACR Breast Density Category c: The breast tissue is heterogeneously  dense, which may obscure small masses. FINDINGS: There are stable postsurgical changes within the LEFT breast. There are no new dominant masses, suspicious calcifications or secondary signs of malignancy within the LEFT breast. There is a new spiculated mass within the upper-outer quadrant of the RIGHT breast, at middle to posterior depth, measuring approximately 1.6 cm greatest dimension. Targeted ultrasound is performed, showing an irregular hypoechoic mass in the RIGHT breast at the 10 o'clock axis, 5 cm from the nipple, measuring 1.8 x 1.4 x 1.6 cm, corresponding to the mammographic finding. RIGHT axilla was evaluated with ultrasound showing no enlarged or morphologically abnormal lymph nodes. IMPRESSION: 1. Irregular mass in the RIGHT breast at the 10 o'clock axis, 5 cm from the nipple, measuring 1.8 cm, corresponding to the mammographic finding. 2. No evidence of malignancy within the LEFT breast. Stable postsurgical changes within the LEFT breast. RECOMMENDATION: Ultrasound-guided biopsy for the RIGHT breast mass at the 10 o'clock axis. Ultrasound-guided biopsy is scheduled on October 31st. I have discussed the findings and recommendations with the patient. If applicable, a reminder letter will be sent to the patient regarding the next appointment. BI-RADS CATEGORY  5: Highly suggestive of malignancy. Electronically Signed   By: Franki Cabot M.D.   On: 07/15/2022 16:51       IMPRESSION/PLAN: 1. Metastatic  triple positive invasive ductal carcinoma the left breast with new diagnosis if triple positive right breast cancer. Dr. Lisbeth Renshaw discusses the patient's course to date and workup thusfar. He favors additional diagnostic imaging to clarify the site to consider a palliative regimen to. Her symptoms seem most consistent with offering treatment to the sternum and upper T spine, however we will await the results of her PET scan which we're trying to coordinate for tomorrow.  We discussed the risks, benefits, short, and long term effects of radiotherapy to possible sites, as well as the palliative intent, and the patient is interested in proceeding once we have further clarity from her PET imaging. Dr. Lisbeth Renshaw discusses the delivery and logistics of radiotherapy and anticipates a course of 2 weeks of radiotherapy. We will sign written consent at the time of simulation which we will tentatively schedule for this Thursday.  2. Painful bone metastases. The patient's pain is not optimally controlled but she is going to try steroids that were prescribed yesterday. She is also open to meeting with palliative medicine for symptom management if this does not seem to help.  3. Contraceptive Counseling. The patient is still having menstrual cycles but is not sexually active at this time. She is aware if she were to become active prior to therapy, she would need to use barrier methods of contraception during radiation. 4. Code Status. The patient has already documented code status about a year ago and desires DNR status. An order was placed at her request to update this in her chart. She has a copy of the yellow DNR form as well dated from 08/04/21, so this date was documented in the order to update this status.  In a visit lasting 60 minutes, greater than 50% of the time was spent face to face discussing the patient's condition, in preparation for the discussion, and coordinating the patient's care.   The above documentation  reflects my direct findings during this shared patient visit. Please see the separate note by Dr. Lisbeth Renshaw on this date for the remainder of the patient's plan of care.    Carola Rhine, [facility]   **Disclaimer: This note was dictated with voice recognition  software. Similar sounding words can inadvertently be transcribed and this note may contain transcription errors which may not have been corrected upon publication of note.**

## 2022-07-28 NOTE — Progress Notes (Signed)
Reconsult appointment for Malignant neoplasm metastatic to bone.  I verified patient's identity and began nursing interview. Patient reports severe pain in mid sternum, and upper spine 8/10. This effects her ability come complete daily tasks. No other issues reported at this time.  Meaningful use complete. No chances of pregnancy. LMP-07/10/22  BP (!) 128/94 (BP Location: Right Arm, Patient Position: Sitting, Cuff Size: Normal)   Pulse 92   Temp 97.7 F (36.5 C) (Temporal)   Resp 20   Ht '5\' 5"'$  (1.651 m)   Wt 169 lb 9.6 oz (76.9 kg)   LMP 07/10/2022   SpO2 96%   BMI 28.22 kg/m   This concludes this nursing interview.  Leandra Kern, LPN

## 2022-07-29 ENCOUNTER — Ambulatory Visit (HOSPITAL_COMMUNITY)
Admission: RE | Admit: 2022-07-29 | Discharge: 2022-07-29 | Disposition: A | Discharge status: Home / Self Care | Payer: 59 | Source: Ambulatory Visit | Attending: Radiation Oncology | Admitting: Radiation Oncology

## 2022-07-29 ENCOUNTER — Other Ambulatory Visit (HOSPITAL_COMMUNITY): Payer: 59

## 2022-07-29 DIAGNOSIS — C50212 Malignant neoplasm of upper-inner quadrant of left female breast: Secondary | ICD-10-CM | POA: Diagnosis not present

## 2022-07-29 DIAGNOSIS — C7951 Secondary malignant neoplasm of bone: Secondary | ICD-10-CM | POA: Diagnosis not present

## 2022-07-29 DIAGNOSIS — C7981 Secondary malignant neoplasm of breast: Secondary | ICD-10-CM | POA: Diagnosis not present

## 2022-07-29 DIAGNOSIS — Z170 Estrogen receptor positive status [ER+]: Secondary | ICD-10-CM | POA: Diagnosis not present

## 2022-07-29 LAB — GLUCOSE, CAPILLARY: Glucose-Capillary: 136 mg/dL — ABNORMAL HIGH (ref 70–99)

## 2022-07-29 MED ORDER — FLUDEOXYGLUCOSE F - 18 (FDG) INJECTION
8.4000 | Freq: Once | INTRAVENOUS | Status: AC
  Administered 2022-07-29: 8.4 via INTRAVENOUS

## 2022-07-30 ENCOUNTER — Other Ambulatory Visit: Payer: Self-pay | Admitting: Hematology and Oncology

## 2022-07-30 ENCOUNTER — Encounter: Payer: Self-pay | Admitting: Hematology and Oncology

## 2022-07-30 ENCOUNTER — Other Ambulatory Visit: Payer: Self-pay

## 2022-07-30 ENCOUNTER — Ambulatory Visit
Admission: RE | Admit: 2022-07-30 | Discharge: 2022-07-30 | Disposition: A | Discharge status: Home / Self Care | Payer: 59 | Source: Ambulatory Visit | Attending: Radiation Oncology | Admitting: Radiation Oncology

## 2022-07-30 ENCOUNTER — Inpatient Hospital Stay (HOSPITAL_BASED_OUTPATIENT_CLINIC_OR_DEPARTMENT_OTHER): Payer: 59 | Admitting: Hematology and Oncology

## 2022-07-30 ENCOUNTER — Other Ambulatory Visit (HOSPITAL_COMMUNITY): Payer: Self-pay

## 2022-07-30 DIAGNOSIS — Z170 Estrogen receptor positive status [ER+]: Secondary | ICD-10-CM

## 2022-07-30 DIAGNOSIS — Z510 Encounter for antineoplastic radiation therapy: Secondary | ICD-10-CM | POA: Diagnosis not present

## 2022-07-30 DIAGNOSIS — C50212 Malignant neoplasm of upper-inner quadrant of left female breast: Secondary | ICD-10-CM | POA: Insufficient documentation

## 2022-07-30 DIAGNOSIS — C50911 Malignant neoplasm of unspecified site of right female breast: Secondary | ICD-10-CM | POA: Diagnosis not present

## 2022-07-30 DIAGNOSIS — C7951 Secondary malignant neoplasm of bone: Secondary | ICD-10-CM | POA: Diagnosis not present

## 2022-07-30 DIAGNOSIS — C50912 Malignant neoplasm of unspecified site of left female breast: Secondary | ICD-10-CM | POA: Diagnosis not present

## 2022-07-30 MED ORDER — OXYCODONE-ACETAMINOPHEN 10-325 MG PO TABS
1.0000 | ORAL_TABLET | Freq: Three times a day (TID) | ORAL | 0 refills | Status: DC | PRN
  Filled 2022-07-30: qty 90, 30d supply, fill #0

## 2022-07-30 NOTE — Progress Notes (Signed)
HEMATOLOGY-ONCOLOGY TELEPHONE VISIT PROGRESS NOTE  I connected with our patient on 07/30/22 at  1:30 PM EST by telephone and verified that I am speaking with the correct person using two identifiers.  I discussed the limitations, risks, security and privacy concerns of performing an evaluation and management service by telephone and the availability of in person appointments.  I also discussed with the patient that there may be a patient responsible charge related to this service. The patient expressed understanding and agreed to proceed.   History of Present Illness: Natalie Griffin is a 46 year old with above-mentioned history of left breast cancer who underwent biopsy on the right breast because of a new 1.8 cm tumor on mammogram and ultrasound.  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 She presents to the clinic for a phone follow-up. Pain is slightly better but still hurting all over. Had radiation consult.   Oncology History  Malignant neoplasm of upper-inner quadrant of left breast in female, estrogen receptor positive ([place])  04/11/2020 Initial Diagnosis   Patient palpated a left breast lump. Mammogram on 03/08/20 showed a 3.0cm mass at the 11 o'clock position with no axillary adenopathy. 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   05/09/2020 Cancer Staging   Staging form: Breast, AJCC 8th Edition - Clinical stage from 05/09/2020: Stage IB (cT2, cN0, cM0, G2, ER+, PR+, HER2+) - Signed by Nicholas Lose, MD on 05/09/2020   06/04/2020 Surgery   Left lumpectomy ([name]): invasive and in situ ductal carcinoma, 3.2cm, clear margins, one left axillary lymph node negative for carcinoma.    02/2021 Relapse/Recurrence   Patient went to orthopedics for right hip pain.  Imaging revealed bone metastasis.  CT CAP: Cortical destruction of the right femoral neck consistent with skeletal metastases at risk  for pathologic fracture.  Lucent lesion in the left iliac bone and L3 vertebral body consistent with multifocal skeletal metastases.   06/11/2021 -  Chemotherapy   Patient is on Treatment Plan : BREAST Trastuzumab SQ (Hylecta) q21d       REVIEW OF SYSTEMS:   Constitutional: Denies fevers, chills or abnormal weight loss All other systems were reviewed with the patient and are negative. Observations/Objective:     Assessment Plan:  Malignant neoplasm of upper-inner quadrant of left breast in female, estrogen receptor positive ([place]) 22/2021:Patient palpated a left breast lump. Mammogram on 03/08/20 showed a 3.0cm mass at the 11 o'clock position with no axillary adenopathy. 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  Patient refused adjuvant chemo, adjuvant radiation and adjuvant antiestrogen therapy.   Patient went to orthopedics for right hip pain.  Imaging revealed bone metastasis.  CT CAP: Cortical destruction of the right femoral neck consistent with skeletal metastases at risk for pathologic fracture.  Lucent lesion in the left iliac bone and L3 vertebral body consistent with multifocal skeletal metastases. --------------------------------------------------------------- 04/01/2021: Femoral intramedullary nail  Pathology review: Metastatic breast cancer ER 2% PR 0% HER2 3+ positive   Treatment plan: Tamoxifen 20 mg was prescribed on 04/08/2021 (discontinued by patient because she felt that the risks and benefits did not support it), subcutaneous Herceptin Perjeta (Phesgo) injection started 05/14/2021, switched to Herceptin Hylecta 06/11/2021 (Fixed drug eruption, profound diarrhea to Phesgo)  Palliative radiation to the iliac bone and hip: 05/20/2021-06/03/2021      Mid back pain: MRI thoracic spine 02/04/2022: Numerous bone metastases thoracic spine cervical and lumbar spines largest T5, T8, T11.  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   CT chest abdomen pelvis 05/07/2022: No new or progressive bone metastases.  Stable lytic changes  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%, HER2 3+ positive, Ki-67 35% Based on the biopsy results, we will continue with anti-HER2 therapy along with antiestrogen therapy.   Treatment plan change: Oophorectomy Switch Tam to Anastrozole I recommended switching anti-HER2 therapy to either Enhertu or Kadcyla or Xeloda with lapatinib. She will think about her options and will call us next Monday.  I recommended that she pursue disability because of her diffuse bone metastases and her high likelihood of fracture in her current job position.    I discussed the assessment and treatment plan with the patient. The patient was provided an opportunity to ask questions and all were answered. The patient agreed with the plan and demonstrated an understanding of the instructions. The patient was advised to call back or seek an in-person evaluation if the symptoms worsen or if the condition fails to improve as anticipated.   I provided 15 minutes of non-face-to-face time during this encounter.  This includes time for charting and coordination of care   Harriette Ohara, MD  I Gardiner Coins am scribing for Dr. Lindi Adie  I have reviewed the above documentation for accuracy and completeness, and I agree with the above.

## 2022-07-30 NOTE — Assessment & Plan Note (Signed)
22/2021:Patient palpated a left breast lump. Mammogram on 03/08/20 showed a 3.0cm mass at the 11 o'clock position with no axillary adenopathy. 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  Patient refused adjuvant chemo, adjuvant radiation and adjuvant antiestrogen therapy.   Patient went to orthopedics for right hip pain.  Imaging revealed bone metastasis.  CT CAP: Cortical destruction of the right femoral neck consistent with skeletal metastases at risk for pathologic fracture.  Lucent lesion in the left iliac bone and L3 vertebral body consistent with multifocal skeletal metastases. --------------------------------------------------------------- 04/01/2021: Femoral intramedullary nail  Pathology review: Metastatic breast cancer ER 2% PR 0% HER2 3+ positive   Treatment plan: Tamoxifen 20 mg was prescribed on 04/08/2021 (discontinued by patient because she felt that the risks and benefits did not support it), subcutaneous Herceptin Perjeta (Phesgo) injection started 05/14/2021, switched to Herceptin Hylecta 06/11/2021 (Fixed drug eruption, profound diarrhea to Phesgo)   Palliative radiation to the iliac bone and hip: 05/20/2021-06/03/2021     Mid back pain: MRI thoracic spine 02/04/2022: Numerous bone metastases thoracic spine cervical and lumbar spines largest T5, T8, T11.  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   CT chest abdomen pelvis 05/07/2022: No new or progressive bone metastases.  Stable lytic changes  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%, HER2 3+ positive, Ki-67 35% Based on the biopsy results, we will continue  with anti-HER2 therapy along with antiestrogen therapy.   Treatment plan change: Oophorectomy Switch Tam to Anastrozole I recommended switching anti-HER2 therapy to either Enhertu or Kadcyla or Xeloda with lapatinib. She will think about her options and will call us next Monday.  I recommended that she pursue disability because of her diffuse bone metastases and her high likelihood of fracture in her current job position.

## 2022-07-30 NOTE — Addendum Note (Signed)
Encounter addended by: Kyung Rudd, MD on: 07/30/2022 4:24 PM  Actions taken: Clinical Note Signed

## 2022-07-31 ENCOUNTER — Other Ambulatory Visit (HOSPITAL_COMMUNITY): Payer: Self-pay

## 2022-08-01 ENCOUNTER — Other Ambulatory Visit (HOSPITAL_COMMUNITY): Payer: Self-pay

## 2022-08-03 ENCOUNTER — Other Ambulatory Visit (HOSPITAL_COMMUNITY): Payer: Self-pay

## 2022-08-03 ENCOUNTER — Encounter: Payer: Self-pay | Admitting: Hematology and Oncology

## 2022-08-03 ENCOUNTER — Ambulatory Visit: Payer: 59

## 2022-08-03 DIAGNOSIS — C7951 Secondary malignant neoplasm of bone: Secondary | ICD-10-CM | POA: Diagnosis not present

## 2022-08-03 DIAGNOSIS — C50212 Malignant neoplasm of upper-inner quadrant of left female breast: Secondary | ICD-10-CM | POA: Diagnosis not present

## 2022-08-03 DIAGNOSIS — Z510 Encounter for antineoplastic radiation therapy: Secondary | ICD-10-CM | POA: Diagnosis not present

## 2022-08-03 DIAGNOSIS — C50912 Malignant neoplasm of unspecified site of left female breast: Secondary | ICD-10-CM | POA: Diagnosis not present

## 2022-08-03 DIAGNOSIS — C50911 Malignant neoplasm of unspecified site of right female breast: Secondary | ICD-10-CM | POA: Diagnosis not present

## 2022-08-04 ENCOUNTER — Other Ambulatory Visit (HOSPITAL_COMMUNITY): Payer: Self-pay

## 2022-08-04 ENCOUNTER — Other Ambulatory Visit: Payer: Self-pay

## 2022-08-04 ENCOUNTER — Ambulatory Visit
Admission: RE | Admit: 2022-08-04 | Discharge: 2022-08-04 | Disposition: A | Discharge status: Home / Self Care | Payer: 59 | Source: Ambulatory Visit | Attending: Radiation Oncology | Admitting: Radiation Oncology

## 2022-08-04 DIAGNOSIS — C50911 Malignant neoplasm of unspecified site of right female breast: Secondary | ICD-10-CM | POA: Diagnosis not present

## 2022-08-04 DIAGNOSIS — Z510 Encounter for antineoplastic radiation therapy: Secondary | ICD-10-CM | POA: Diagnosis not present

## 2022-08-04 DIAGNOSIS — C7951 Secondary malignant neoplasm of bone: Secondary | ICD-10-CM | POA: Diagnosis not present

## 2022-08-04 DIAGNOSIS — C50912 Malignant neoplasm of unspecified site of left female breast: Secondary | ICD-10-CM | POA: Diagnosis not present

## 2022-08-04 DIAGNOSIS — C50212 Malignant neoplasm of upper-inner quadrant of left female breast: Secondary | ICD-10-CM | POA: Diagnosis not present

## 2022-08-04 LAB — RAD ONC ARIA SESSION SUMMARY
Course Elapsed Days: 0
Plan Fractions Treated to Date: 1
Plan Prescribed Dose Per Fraction: 3 Gy
Plan Total Fractions Prescribed: 10
Plan Total Prescribed Dose: 30 Gy
Reference Point Dosage Given to Date: 3 Gy
Reference Point Session Dosage Given: 3 Gy
Session Number: 1

## 2022-08-05 ENCOUNTER — Other Ambulatory Visit: Payer: Self-pay

## 2022-08-05 ENCOUNTER — Ambulatory Visit
Admission: RE | Admit: 2022-08-05 | Discharge: 2022-08-05 | Disposition: A | Discharge status: Home / Self Care | Payer: 59 | Source: Ambulatory Visit | Attending: Radiation Oncology | Admitting: Radiation Oncology

## 2022-08-05 DIAGNOSIS — C50911 Malignant neoplasm of unspecified site of right female breast: Secondary | ICD-10-CM | POA: Diagnosis not present

## 2022-08-05 DIAGNOSIS — C50212 Malignant neoplasm of upper-inner quadrant of left female breast: Secondary | ICD-10-CM | POA: Diagnosis not present

## 2022-08-05 DIAGNOSIS — C7951 Secondary malignant neoplasm of bone: Secondary | ICD-10-CM | POA: Diagnosis not present

## 2022-08-05 DIAGNOSIS — Z510 Encounter for antineoplastic radiation therapy: Secondary | ICD-10-CM | POA: Diagnosis not present

## 2022-08-05 DIAGNOSIS — C50912 Malignant neoplasm of unspecified site of left female breast: Secondary | ICD-10-CM | POA: Diagnosis not present

## 2022-08-05 LAB — RAD ONC ARIA SESSION SUMMARY
Course Elapsed Days: 1
Plan Fractions Treated to Date: 2
Plan Prescribed Dose Per Fraction: 3 Gy
Plan Total Fractions Prescribed: 10
Plan Total Prescribed Dose: 30 Gy
Reference Point Dosage Given to Date: 6 Gy
Reference Point Session Dosage Given: 3 Gy
Session Number: 2

## 2022-08-06 ENCOUNTER — Other Ambulatory Visit: Payer: Self-pay | Admitting: *Deleted

## 2022-08-06 ENCOUNTER — Other Ambulatory Visit (HOSPITAL_COMMUNITY): Payer: Self-pay

## 2022-08-06 ENCOUNTER — Inpatient Hospital Stay (HOSPITAL_BASED_OUTPATIENT_CLINIC_OR_DEPARTMENT_OTHER): Payer: 59 | Admitting: Hematology and Oncology

## 2022-08-06 ENCOUNTER — Other Ambulatory Visit: Payer: Self-pay

## 2022-08-06 ENCOUNTER — Inpatient Hospital Stay: Payer: 59

## 2022-08-06 ENCOUNTER — Encounter: Payer: Self-pay | Admitting: Hematology and Oncology

## 2022-08-06 ENCOUNTER — Telehealth: Payer: Self-pay

## 2022-08-06 ENCOUNTER — Ambulatory Visit
Admission: RE | Admit: 2022-08-06 | Discharge: 2022-08-06 | Disposition: A | Discharge status: Home / Self Care | Payer: 59 | Source: Ambulatory Visit | Attending: Radiation Oncology | Admitting: Radiation Oncology

## 2022-08-06 VITALS — BP 122/76 | HR 82 | Temp 97.7°F | Resp 18 | Ht 65.0 in | Wt 177.9 lb

## 2022-08-06 DIAGNOSIS — Z170 Estrogen receptor positive status [ER+]: Secondary | ICD-10-CM

## 2022-08-06 DIAGNOSIS — C50212 Malignant neoplasm of upper-inner quadrant of left female breast: Secondary | ICD-10-CM | POA: Diagnosis not present

## 2022-08-06 DIAGNOSIS — Z510 Encounter for antineoplastic radiation therapy: Secondary | ICD-10-CM | POA: Diagnosis not present

## 2022-08-06 DIAGNOSIS — C7951 Secondary malignant neoplasm of bone: Secondary | ICD-10-CM | POA: Diagnosis not present

## 2022-08-06 DIAGNOSIS — C50912 Malignant neoplasm of unspecified site of left female breast: Secondary | ICD-10-CM | POA: Diagnosis not present

## 2022-08-06 DIAGNOSIS — C50911 Malignant neoplasm of unspecified site of right female breast: Secondary | ICD-10-CM | POA: Diagnosis not present

## 2022-08-06 LAB — RAD ONC ARIA SESSION SUMMARY
Course Elapsed Days: 2
Plan Fractions Treated to Date: 3
Plan Prescribed Dose Per Fraction: 3 Gy
Plan Total Fractions Prescribed: 10
Plan Total Prescribed Dose: 30 Gy
Reference Point Dosage Given to Date: 9 Gy
Reference Point Session Dosage Given: 3 Gy
Session Number: 3

## 2022-08-06 LAB — CBC WITH DIFFERENTIAL (CANCER CENTER ONLY)
Abs Immature Granulocytes: 0.16 10*3/uL — ABNORMAL HIGH (ref 0.00–0.07)
Basophils Absolute: 0 10*3/uL (ref 0.0–0.1)
Basophils Relative: 0 %
Eosinophils Absolute: 0.1 10*3/uL (ref 0.0–0.5)
Eosinophils Relative: 1 %
HCT: 32.9 % — ABNORMAL LOW (ref 36.0–46.0)
Hemoglobin: 10.7 g/dL — ABNORMAL LOW (ref 12.0–15.0)
Immature Granulocytes: 2 %
Lymphocytes Relative: 18 %
Lymphs Abs: 1.6 10*3/uL (ref 0.7–4.0)
MCH: 29.2 pg (ref 26.0–34.0)
MCHC: 32.5 g/dL (ref 30.0–36.0)
MCV: 89.6 fL (ref 80.0–100.0)
Monocytes Absolute: 0.5 10*3/uL (ref 0.1–1.0)
Monocytes Relative: 5 %
Neutro Abs: 6.6 10*3/uL (ref 1.7–7.7)
Neutrophils Relative %: 74 %
Platelet Count: 288 10*3/uL (ref 150–400)
RBC: 3.67 MIL/uL — ABNORMAL LOW (ref 3.87–5.11)
RDW: 15.1 % (ref 11.5–15.5)
WBC Count: 9 10*3/uL (ref 4.0–10.5)
nRBC: 0 % (ref 0.0–0.2)

## 2022-08-06 LAB — CMP (CANCER CENTER ONLY)
ALT: 23 U/L (ref 0–44)
AST: 27 U/L (ref 15–41)
Albumin: 4.3 g/dL (ref 3.5–5.0)
Alkaline Phosphatase: 126 U/L (ref 38–126)
Anion gap: 11 (ref 5–15)
BUN: 25 mg/dL — ABNORMAL HIGH (ref 6–20)
CO2: 21 mmol/L — ABNORMAL LOW (ref 22–32)
Calcium: 10 mg/dL (ref 8.9–10.3)
Chloride: 107 mmol/L (ref 98–111)
Creatinine: 0.83 mg/dL (ref 0.44–1.00)
GFR, Estimated: >60 mL/min (ref >60)
Glucose, Bld: 148 mg/dL — ABNORMAL HIGH (ref 70–99)
Potassium: 4.2 mmol/L (ref 3.5–5.1)
Sodium: 139 mmol/L (ref 135–145)
Total Bilirubin: 0.2 mg/dL — ABNORMAL LOW (ref 0.3–1.2)
Total Protein: 7.7 g/dL (ref 6.5–8.1)

## 2022-08-06 LAB — SAMPLE TO BLOOD BANK

## 2022-08-06 LAB — ABO/RH: ABO/RH(D): A POS

## 2022-08-06 LAB — MAGNESIUM: Magnesium: 1.8 mg/dL (ref 1.7–2.4)

## 2022-08-06 MED ORDER — FENTANYL 25 MCG/HR TD PT72
1.0000 | MEDICATED_PATCH | TRANSDERMAL | 0 refills | Status: DC
  Filled 2022-08-06 – 2022-08-07 (×4): qty 10, 30d supply, fill #0

## 2022-08-06 NOTE — Telephone Encounter (Signed)
Spoke with Robert Bellow regarding her referral to GYN oncology. She has an appointment scheduled with Dr. Berline Lopes on 08/21/22 at 11:15. Patient agrees to date and time. She has been provided with office address and location. She is also aware of our mask and visitor policy. Patient verbalized understanding and will call with any questions.

## 2022-08-06 NOTE — Progress Notes (Signed)
Patient Care Team: Sueanne Margarita, DO as PCP - General (Internal Medicine) Rockwell Germany, RN as Oncology Nurse Navigator Mauro Kaufmann, RN as Oncology Nurse Navigator  DIAGNOSIS:  Encounter Diagnosis  Name Primary?   Malignant neoplasm of upper-inner quadrant of left breast in female, estrogen receptor positive ([place]) Yes    SUMMARY OF ONCOLOGIC HISTORY: Oncology History  Malignant neoplasm of upper-inner quadrant of left breast in female, estrogen receptor positive ([place])  04/11/2020 Initial Diagnosis   Patient palpated a left breast lump. Mammogram on 03/08/20 showed a 3.0cm mass at the 11 o'clock position with no axillary adenopathy. 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   05/09/2020 Cancer Staging   Staging form: Breast, AJCC 8th Edition - Clinical stage from 05/09/2020: Stage IB (cT2, cN0, cM0, G2, ER+, PR+, HER2+) - Signed by Nicholas Lose, MD on 05/09/2020   06/04/2020 Surgery   Left lumpectomy ([name]): invasive and in situ ductal carcinoma, 3.2cm, clear margins, one left axillary lymph node negative for carcinoma.    02/2021 Relapse/Recurrence   Patient went to orthopedics for right hip pain.  Imaging revealed bone metastasis.  CT CAP: Cortical destruction of the right femoral neck consistent with skeletal metastases at risk for pathologic fracture.  Lucent lesion in the left iliac bone and L3 vertebral body consistent with multifocal skeletal metastases.   06/11/2021 -  Chemotherapy   Patient is on Treatment Plan : BREAST Trastuzumab SQ (Hylecta) q21d       CHIEF COMPLIANT: Follow-up metastatic breast cancer  INTERVAL HISTORY: Natalie Griffin is a 46 year old with above-mentioned history of left breast cancer. Currently on tamoxifen. She presents to the clinic for a follow-up. She states that she is very tired and she is in a lot pf pain. She having shortness of breath. She having pain in  her spine T3 and T4 area. More of left side of sternum. She has been taking the oxycodone for pain. She has been eating o.k She says fluids goes right through her.    ALLERGIES:  is allergic to adhesive [tape], phesgo [pertuz-trastuz-hyaluron-zzxf], tetanus toxoid, succinylcholine, and contrast media [iodinated contrast media].  MEDICATIONS:  Current Outpatient Medications  Medication Sig Dispense Refill   fentaNYL (DURAGESIC) 25 MCG/HR Place 1 patch onto the skin every 3 (three) days. 10 patch 0   acetaminophen (TYLENOL) 500 MG tablet Take 2 tablets (1,000 mg total) by mouth every 8 (eight) hours as needed for mild pain 90 tablet 3   ALPRAZolam (XANAX) 0.5 MG tablet Take 1 tablet (0.5 mg total) by mouth 3 (three) times daily as needed for anxiety. (Patient not taking: Reported on 05/14/2022) 60 tablet 3   amphetamine-dextroamphetamine (ADDERALL) 10 MG tablet Take 1 tablet (10 mg total) by mouth 2 (two) times daily as needed. 60 tablet 0   calcium carbonate (OS-CAL) 600 MG TABS tablet Take 600 mg by mouth daily with breakfast.     dexamethasone (DECADRON) 2 MG tablet Take 1 tablet (2 mg total) by mouth daily. 30 tablet 3   ferrous sulfate 324 MG TBEC Take 324 mg by mouth.     Multiple Vitamins-Minerals (MULTIVITAMIN WITH MINERALS) tablet Take 1 tablet by mouth daily.     oxyCODONE-acetaminophen (PERCOCET) 10-325 MG tablet Take 1 tablet by mouth every 8 (eight) hours as needed for pain. 90 tablet 0   tamoxifen (NOLVADEX) 20 MG tablet Take 1 tablet (20 mg total) by mouth daily. [place]  tablet 3   tiZANidine (ZANAFLEX) 4 MG tablet Take 1 tablet (4 mg total) by mouth every 8 (eight) hours as needed for muscle spasms. 30 tablet 0   Vitamin D, Cholecalciferol, 25 MCG (1000 UT) TABS Take 1 tablet by mouth daily. 60 tablet    No current facility-administered medications for this visit.    PHYSICAL EXAMINATION: ECOG PERFORMANCE STATUS: 1 - Symptomatic but completely ambulatory  Vitals:   08/06/22 1333   BP: 122/76  Pulse: 82  Resp: 18  Temp: 97.7 F (36.5 C)  SpO2: 97%   Filed Weights   08/06/22 1333  Weight: 177 lb 14.4 oz (80.7 kg)      LABORATORY DATA:  I have reviewed the data as listed    Latest Ref Rng & Units 06/18/2022    1:20 PM 04/15/2022    1:49 PM 02/10/2022    2:09 PM  CMP  Glucose 70 - 99 mg/dL 121  114  134   BUN 6 - 20 mg/dL _0 Creatinine 0.44 - 1.00 mg/dL 0.78  0.79  0.90   Sodium 135 - 145 mmol/L 139  141  138   Potassium 3.5 - 5.1 mmol/L 4.1  4.0  3.7   Chloride 98 - 111 mmol/L 103  110  106   CO2 22 - 32 mmol/L _1 Calcium 8.9 - 10.3 mg/dL 10.1  9.6  9.8   Total Protein 6.5 - 8.1 g/dL 8.0  8.0  7.4   Total Bilirubin 0.3 - 1.2 mg/dL 0.4  0.2  0.2   Alkaline Phos 38 - 126 U/L 72  40  39   AST 15 - 41 U/L 33  31  23   ALT 0 - 44 U/L 27  32  30     Lab Results  Component Value Date   WBC 9.0 08/06/2022   HGB 10.7 (L) 08/06/2022   HCT 32.9 (L) 08/06/2022   MCV 89.6 08/06/2022   PLT 288 08/06/2022   NEUTROABS 6.6 08/06/2022    ASSESSMENT & PLAN:  Malignant neoplasm of upper-inner quadrant of left breast in female, estrogen receptor positive ([place]) 22/2021:Patient palpated a left breast lump. Mammogram on 03/08/20 showed a 3.0cm mass at the 11 o'clock position with no axillary adenopathy. 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  Patient refused adjuvant chemo, adjuvant radiation and adjuvant antiestrogen therapy.   Patient went to orthopedics for right hip pain.  Imaging revealed bone metastasis.  CT CAP: Cortical destruction of the right femoral neck consistent with skeletal metastases at risk for pathologic fracture.  Lucent lesion in the left iliac bone and L3 vertebral body consistent with multifocal  skeletal metastases. --------------------------------------------------------------- 04/01/2021: Femoral intramedullary nail  Pathology review: Metastatic breast cancer ER 2% PR 0% HER2 3+ positive   Treatment plan: Tamoxifen 20 mg was prescribed on 04/08/2021 (discontinued by patient because she felt that the risks and benefits did not support it), subcutaneous Herceptin Perjeta (Phesgo) injection started 05/14/2021, switched to Herceptin Hylecta 06/11/2021 (Fixed drug eruption, profound diarrhea to Phesgo)   Palliative radiation to the iliac bone and hip: 05/20/2021-06/03/2021     Mid back pain: MRI thoracic spine 02/04/2022: Numerous bone metastases thoracic spine cervical and lumbar spines largest T5, T8,  T11.  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   CT chest abdomen pelvis 05/07/2022: No new or progressive bone metastases.  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%, HER2 3+ positive, Ki-67 35% Based on the biopsy results, we will continue with anti-HER2 therapy along with antiestrogen therapy.   Treatment plan change: Oophorectomy: We will consult GYN oncology.  She wants to do the surgery in January Switch Tam to Anastrozole after the oophorectomy I recommended switching anti-HER2 therapy to Kadcyla.  She is willing to consider doing Kadcyla in January.  We might do it for the first cycle using a peripheral IV and then see how she does before we place a port.  Pain control: We will add fentanyl patch 25 mcg every 72 hours daily.  She currently has Percocets that she takes as needed.  Profound fatigue and weakness: Probably from radiation as well as the diffuse metastasis.  Return to clinic in January to discuss her treatment plan.    Orders Placed This Encounter  Procedures   Ambulatory referral to Gynecologic  Oncology    Referral Priority:   Routine    Referral Type:   Consultation    Referral Reason:   Specialty Services Required    Requested Specialty:   Gynecologic Oncology    Number of Visits Requested:   1   The patient has a good understanding of the overall plan. she agrees with it. she will call with any problems that may develop before the next visit here. Total time spent: 30 mins including face to face time and time spent for planning, charting and co-ordination of care   Harriette Ohara, MD 08/06/22    I Gardiner Coins am scribing for Dr. Lindi Adie  I have reviewed the above documentation for accuracy and completeness, and I agree with the above.

## 2022-08-06 NOTE — Assessment & Plan Note (Addendum)
22/2021:Patient palpated a left breast lump. Mammogram on 03/08/20 showed a 3.0cm mass at the 11 o'clock position with no axillary adenopathy. 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  Patient refused adjuvant chemo, adjuvant radiation and adjuvant antiestrogen therapy.   Patient went to orthopedics for right hip pain.  Imaging revealed bone metastasis.  CT CAP: Cortical destruction of the right femoral neck consistent with skeletal metastases at risk for pathologic fracture.  Lucent lesion in the left iliac bone and L3 vertebral body consistent with multifocal skeletal metastases. --------------------------------------------------------------- 04/01/2021: Femoral intramedullary nail  Pathology review: Metastatic breast cancer ER 2% PR 0% HER2 3+ positive   Treatment plan: Tamoxifen 20 mg was prescribed on 04/08/2021 (discontinued by patient because she felt that the risks and benefits did not support it), subcutaneous Herceptin Perjeta (Phesgo) injection started 05/14/2021, switched to Herceptin Hylecta 06/11/2021 (Fixed drug eruption, profound diarrhea to Phesgo)   Palliative radiation to the iliac bone and hip: 05/20/2021-06/03/2021     Mid back pain: MRI thoracic spine 02/04/2022: Numerous bone metastases thoracic spine cervical and lumbar spines largest T5, T8, T11.  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   CT chest abdomen pelvis 05/07/2022: No new or progressive bone metastases.  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%, HER2 3+ positive, Ki-67 35% Based on the biopsy results, we will continue  with anti-HER2 therapy along with antiestrogen therapy.   Treatment plan change: Oophorectomy: We will consult GYN oncology.  She wants to do the surgery in January Switch Tam to Anastrozole after the oophorectomy I recommended switching anti-HER2 therapy to Kadcyla.  She is willing to consider doing Kadcyla in January.  We might do it for the first cycle using a peripheral IV and then see how she does before we place a port.  Pain control: We will add fentanyl patch 25 mcg every 72 hours daily.  She currently has Percocets that she takes as needed.  Profound fatigue and weakness: Probably from radiation as well as the diffuse metastasis.  Return to clinic in January to discuss her treatment plan.

## 2022-08-07 ENCOUNTER — Other Ambulatory Visit (HOSPITAL_COMMUNITY): Payer: Self-pay

## 2022-08-07 ENCOUNTER — Telehealth: Payer: Self-pay

## 2022-08-07 ENCOUNTER — Ambulatory Visit
Admission: RE | Admit: 2022-08-07 | Discharge: 2022-08-07 | Disposition: A | Discharge status: Home / Self Care | Payer: 59 | Source: Ambulatory Visit | Attending: Radiation Oncology | Admitting: Radiation Oncology

## 2022-08-07 ENCOUNTER — Encounter: Payer: Self-pay | Admitting: Hematology and Oncology

## 2022-08-07 ENCOUNTER — Other Ambulatory Visit: Payer: Self-pay

## 2022-08-07 ENCOUNTER — Other Ambulatory Visit: Payer: Self-pay | Admitting: Hematology and Oncology

## 2022-08-07 DIAGNOSIS — C50911 Malignant neoplasm of unspecified site of right female breast: Secondary | ICD-10-CM | POA: Diagnosis not present

## 2022-08-07 DIAGNOSIS — C7951 Secondary malignant neoplasm of bone: Secondary | ICD-10-CM | POA: Diagnosis not present

## 2022-08-07 DIAGNOSIS — C50912 Malignant neoplasm of unspecified site of left female breast: Secondary | ICD-10-CM | POA: Diagnosis not present

## 2022-08-07 DIAGNOSIS — C50212 Malignant neoplasm of upper-inner quadrant of left female breast: Secondary | ICD-10-CM | POA: Diagnosis not present

## 2022-08-07 DIAGNOSIS — Z510 Encounter for antineoplastic radiation therapy: Secondary | ICD-10-CM | POA: Diagnosis not present

## 2022-08-07 LAB — RAD ONC ARIA SESSION SUMMARY
Course Elapsed Days: 3
Plan Fractions Treated to Date: 4
Plan Prescribed Dose Per Fraction: 3 Gy
Plan Total Fractions Prescribed: 10
Plan Total Prescribed Dose: 30 Gy
Reference Point Dosage Given to Date: 12 Gy
Reference Point Session Dosage Given: 3 Gy
Session Number: 4

## 2022-08-07 MED ORDER — FENTANYL 25 MCG/HR TD PT72
1.0000 | MEDICATED_PATCH | TRANSDERMAL | 0 refills | Status: DC
  Filled 2022-08-07 (×2): qty 10, 30d supply, fill #0

## 2022-08-07 NOTE — Telephone Encounter (Signed)
Notified Patient of prior authorization approval for Fentanyl Patches.  Medication is approved through 08/06/2023.

## 2022-08-08 ENCOUNTER — Other Ambulatory Visit: Payer: Self-pay

## 2022-08-10 ENCOUNTER — Other Ambulatory Visit: Payer: Self-pay

## 2022-08-10 ENCOUNTER — Ambulatory Visit
Admission: RE | Admit: 2022-08-10 | Discharge: 2022-08-10 | Disposition: A | Discharge status: Home / Self Care | Payer: 59 | Source: Ambulatory Visit | Attending: Radiation Oncology | Admitting: Radiation Oncology

## 2022-08-10 DIAGNOSIS — C50911 Malignant neoplasm of unspecified site of right female breast: Secondary | ICD-10-CM | POA: Diagnosis not present

## 2022-08-10 DIAGNOSIS — C50912 Malignant neoplasm of unspecified site of left female breast: Secondary | ICD-10-CM | POA: Diagnosis not present

## 2022-08-10 DIAGNOSIS — C50212 Malignant neoplasm of upper-inner quadrant of left female breast: Secondary | ICD-10-CM | POA: Diagnosis not present

## 2022-08-10 DIAGNOSIS — C7951 Secondary malignant neoplasm of bone: Secondary | ICD-10-CM | POA: Diagnosis not present

## 2022-08-10 DIAGNOSIS — Z510 Encounter for antineoplastic radiation therapy: Secondary | ICD-10-CM | POA: Diagnosis not present

## 2022-08-10 LAB — RAD ONC ARIA SESSION SUMMARY
Course Elapsed Days: 6
Plan Fractions Treated to Date: 5
Plan Prescribed Dose Per Fraction: 3 Gy
Plan Total Fractions Prescribed: 10
Plan Total Prescribed Dose: 30 Gy
Reference Point Dosage Given to Date: 15 Gy
Reference Point Session Dosage Given: 3 Gy
Session Number: 5

## 2022-08-11 ENCOUNTER — Ambulatory Visit
Admission: RE | Admit: 2022-08-11 | Discharge: 2022-08-11 | Disposition: A | Discharge status: Home / Self Care | Payer: 59 | Source: Ambulatory Visit | Attending: Radiation Oncology | Admitting: Radiation Oncology

## 2022-08-11 ENCOUNTER — Telehealth: Payer: Self-pay | Admitting: *Deleted

## 2022-08-11 ENCOUNTER — Other Ambulatory Visit: Payer: Self-pay | Admitting: *Deleted

## 2022-08-11 ENCOUNTER — Other Ambulatory Visit: Payer: Self-pay

## 2022-08-11 DIAGNOSIS — Z510 Encounter for antineoplastic radiation therapy: Secondary | ICD-10-CM | POA: Diagnosis not present

## 2022-08-11 DIAGNOSIS — C50911 Malignant neoplasm of unspecified site of right female breast: Secondary | ICD-10-CM | POA: Diagnosis not present

## 2022-08-11 DIAGNOSIS — C50212 Malignant neoplasm of upper-inner quadrant of left female breast: Secondary | ICD-10-CM | POA: Diagnosis not present

## 2022-08-11 DIAGNOSIS — C50912 Malignant neoplasm of unspecified site of left female breast: Secondary | ICD-10-CM | POA: Diagnosis not present

## 2022-08-11 DIAGNOSIS — C7951 Secondary malignant neoplasm of bone: Secondary | ICD-10-CM | POA: Diagnosis not present

## 2022-08-11 LAB — RAD ONC ARIA SESSION SUMMARY
Course Elapsed Days: 7
Plan Fractions Treated to Date: 6
Plan Prescribed Dose Per Fraction: 3 Gy
Plan Total Fractions Prescribed: 10
Plan Total Prescribed Dose: 30 Gy
Reference Point Dosage Given to Date: 18 Gy
Reference Point Session Dosage Given: 3 Gy
Session Number: 6

## 2022-08-11 NOTE — Telephone Encounter (Signed)
Connected with [facility] today.  Received nine Fentanyl 25 mcg patches.  No longer using with allergy.  Will notify provider.

## 2022-08-11 NOTE — Progress Notes (Signed)
Received call from stating she experienced reaction from Fentanyl patch over the weekend. Pt states she applied the patch to upper left arm Saturday around 5 pm, took a nap and woke up in a panic at 2 am feeling warm and had a rash around her neck spreading down to her chest and upper back.  Pt states she immediately removed the Fentanyl patch and the rash is starting to dissipate.  RN alerted MD who verbalized understanding.  RN offered pt [facility] visit for the rash and pt declined at this time stating the rash has improved and will alert our office if she needs to be seen. Pt states current p.o pain medication is controlling pain and will alert our office of any changes.

## 2022-08-11 NOTE — Telephone Encounter (Signed)
During call to [place], reports pain to left rib area.  "Not using anything for pain.  Ripped that fentanyl patch off after Saturday night.  After I noted rash, itching, headache, nausea and breathing.  Rash resolving now but was from my neck to chest and back.  I want this patch out of my house.  Radiation advised I need a nurse visit for evaluation."   Currently reports area improved, this nurse advised pictures may be attached to patient portal.  Removing patch was correct thing to do for what sounds like a serious allergic reaction.  Added Fentanyl to allergy list.     Voicemail from [employer], Shelby Dubin [phone]) "Status check for provider certification due today to avoid denial of claim tomorrow.  Spoke with Natalie Griffin, D.O.B. 11/15/1975 to notify her of above.  Do not wish to upset her further causing further stress.  Pease return call."  Collaborative informed of above.  Other forms staff awaiting form with provider signature to return.   This nurse completed process.  Voicemail left for Building control surveyor, [employer].  Connected with Aalyiah who will pick up envelope from forms desk later today.

## 2022-08-12 ENCOUNTER — Other Ambulatory Visit: Payer: 59

## 2022-08-12 ENCOUNTER — Ambulatory Visit: Payer: 59 | Admitting: Hematology and Oncology

## 2022-08-12 ENCOUNTER — Ambulatory Visit
Admission: RE | Admit: 2022-08-12 | Discharge: 2022-08-12 | Disposition: A | Discharge status: Home / Self Care | Payer: 59 | Source: Ambulatory Visit | Attending: Radiation Oncology | Admitting: Radiation Oncology

## 2022-08-12 ENCOUNTER — Ambulatory Visit: Payer: 59

## 2022-08-12 ENCOUNTER — Other Ambulatory Visit: Payer: Self-pay

## 2022-08-12 DIAGNOSIS — C7951 Secondary malignant neoplasm of bone: Secondary | ICD-10-CM | POA: Diagnosis not present

## 2022-08-12 DIAGNOSIS — C50911 Malignant neoplasm of unspecified site of right female breast: Secondary | ICD-10-CM | POA: Diagnosis not present

## 2022-08-12 DIAGNOSIS — Z510 Encounter for antineoplastic radiation therapy: Secondary | ICD-10-CM | POA: Diagnosis not present

## 2022-08-12 DIAGNOSIS — C50912 Malignant neoplasm of unspecified site of left female breast: Secondary | ICD-10-CM | POA: Diagnosis not present

## 2022-08-12 DIAGNOSIS — C50212 Malignant neoplasm of upper-inner quadrant of left female breast: Secondary | ICD-10-CM | POA: Diagnosis not present

## 2022-08-12 LAB — RAD ONC ARIA SESSION SUMMARY
Course Elapsed Days: 8
Plan Fractions Treated to Date: 7
Plan Prescribed Dose Per Fraction: 3 Gy
Plan Total Fractions Prescribed: 10
Plan Total Prescribed Dose: 30 Gy
Reference Point Dosage Given to Date: 21 Gy
Reference Point Session Dosage Given: 3 Gy
Session Number: 7

## 2022-08-15 ENCOUNTER — Encounter: Payer: Self-pay | Admitting: Hematology and Oncology

## 2022-08-17 ENCOUNTER — Encounter: Payer: Self-pay | Admitting: Radiation Oncology

## 2022-08-17 ENCOUNTER — Ambulatory Visit: Payer: 59

## 2022-08-17 ENCOUNTER — Encounter: Payer: Self-pay | Admitting: Hematology and Oncology

## 2022-08-17 ENCOUNTER — Telehealth: Payer: Self-pay | Admitting: Radiation Oncology

## 2022-08-17 NOTE — Progress Notes (Signed)
  Radiation Oncology         [phone]) [phone] ________________________________  Name: Natalie Griffin MRN: [id number]  Date: 08/17/2022  DOB: 1975/11/26  End of Treatment Note  Diagnosis:       Metastatic triple positive invasive ductal carcinoma the left breast with new diagnosis if triple positive right breast cancer.      Indication for treatment:  palliative       Radiation treatment dates:   08/04/22-08/12/22  Site/planned dose:   The sternum and upper thoracic spine were to be treated to 30 Gy over 10 fractions, however the patient decided to forgo the remaining treatments after 7 fractions totaling 21 Gy.   Narrative: The patient developed esophagitis and decided to discontinue therapy despite efforts to improve symptoms.   Plan: We will follow up in one month by phone to see how she's doing and encouraged her to reach out sooner if she has concerns.     Carola Rhine, PAC

## 2022-08-17 NOTE — Telephone Encounter (Signed)
I was notified by Dr. Geralyn Flash team that the patient wanted to cancel her 3 remaining treatments. She is currently receiving palliative radiation to the chest for metastatic breast cancer. She's received a total of 7 of the planned 10 fractions including her sternum and upper Thoracic Spin as one Isocenter. She reports she's had terrible esophagitis and is not able to tolerate most foods. She's eating baby food but over the weekend could not tolerate swallowing her secretions. She is still having terrible pain in her sternum but desires to discontinue her therapy. I offered Carafate, which she declined and prefers to use Aloe Vera Juice. She plans to follow up with Dr. Geralyn Flash team asking them to admit her since she feels like her veins would not be accessible for IVF in an outpatient setting. I offered to coordinate with their team to try to facilitate symptom management or IVF as an outpatient but she prefers to contact the team herself. I will ask our staff to EOT her treatment course.

## 2022-08-18 ENCOUNTER — Ambulatory Visit: Payer: 59

## 2022-08-19 ENCOUNTER — Other Ambulatory Visit: Payer: Self-pay

## 2022-08-19 ENCOUNTER — Ambulatory Visit (HOSPITAL_COMMUNITY)
Admission: RE | Admit: 2022-08-19 | Discharge: 2022-08-19 | Disposition: A | Discharge status: Home / Self Care | Payer: 59 | Source: Ambulatory Visit | Attending: Physician Assistant | Admitting: Physician Assistant

## 2022-08-19 ENCOUNTER — Inpatient Hospital Stay: Payer: 59

## 2022-08-19 ENCOUNTER — Inpatient Hospital Stay (HOSPITAL_BASED_OUTPATIENT_CLINIC_OR_DEPARTMENT_OTHER): Payer: 59 | Admitting: Physician Assistant

## 2022-08-19 ENCOUNTER — Ambulatory Visit: Payer: 59

## 2022-08-19 VITALS — BP 126/83 | HR 81 | Temp 98.0°F | Ht 65.0 in | Wt 177.2 lb

## 2022-08-19 DIAGNOSIS — R0781 Pleurodynia: Secondary | ICD-10-CM | POA: Diagnosis not present

## 2022-08-19 DIAGNOSIS — C7951 Secondary malignant neoplasm of bone: Secondary | ICD-10-CM | POA: Diagnosis not present

## 2022-08-19 DIAGNOSIS — T66XXXA Radiation sickness, unspecified, initial encounter: Secondary | ICD-10-CM

## 2022-08-19 DIAGNOSIS — C50212 Malignant neoplasm of upper-inner quadrant of left female breast: Secondary | ICD-10-CM

## 2022-08-19 DIAGNOSIS — K208 Other esophagitis without bleeding: Secondary | ICD-10-CM

## 2022-08-19 DIAGNOSIS — C50911 Malignant neoplasm of unspecified site of right female breast: Secondary | ICD-10-CM | POA: Diagnosis not present

## 2022-08-19 MED ORDER — MORPHINE SULFATE ER 30 MG PO TBCR
30.0000 mg | EXTENDED_RELEASE_TABLET | Freq: Two times a day (BID) | ORAL | 0 refills | Status: DC
  Filled 2022-08-19: qty 14, 7d supply, fill #0

## 2022-08-19 MED ORDER — SUCRALFATE 1 G PO TABS
1.0000 g | ORAL_TABLET | Freq: Three times a day (TID) | ORAL | 0 refills | Status: DC
  Filled 2022-08-19: qty 120, 30d supply, fill #0

## 2022-08-19 MED ORDER — LIDOCAINE VISCOUS HCL 2 % MT SOLN
15.0000 mL | Freq: Four times a day (QID) | OROMUCOSAL | 0 refills | Status: DC | PRN
  Filled 2022-08-19: qty 100, 2d supply, fill #0

## 2022-08-19 MED ORDER — TRASTUZUMAB-HYALURONIDASE-OYSK 600-10000 MG-UNT/5ML ~~LOC~~ SOLN
600.0000 mg | Freq: Once | SUBCUTANEOUS | Status: AC
  Administered 2022-08-19: 600 mg via SUBCUTANEOUS
  Filled 2022-08-19: qty 5

## 2022-08-19 NOTE — Patient Instructions (Signed)
[facility]  Discharge Instructions: Thank you for choosing [facility] to provide your oncology and hematology care.   If you have a lab appointment with the [facility], please go directly to the [facility] and check in at the registration area.   Wear comfortable clothing and clothing appropriate for easy access to any Portacath or PICC line.   We strive to give you quality time with your provider. You may need to reschedule your appointment if you arrive late (15 or more minutes).  Arriving late affects you and other patients whose appointments are after yours.  Also, if you miss three or more appointments without notifying the office, you may be dismissed from the clinic at the provider's discretion.      For prescription refill requests, have your pharmacy contact our office and allow 72 hours for refills to be completed.    Today you received the following chemotherapy and/or immunotherapy agents: Herceptin Hylecta      To help prevent nausea and vomiting after your treatment, we encourage you to take your nausea medication as directed.  BELOW ARE SYMPTOMS THAT SHOULD BE REPORTED IMMEDIATELY: *FEVER GREATER THAN 100.4 F (38 C) OR HIGHER *CHILLS OR SWEATING *NAUSEA AND VOMITING THAT IS NOT CONTROLLED WITH YOUR NAUSEA MEDICATION *UNUSUAL SHORTNESS OF BREATH *UNUSUAL BRUISING OR BLEEDING *URINARY PROBLEMS (pain or burning when urinating, or frequent urination) *BOWEL PROBLEMS (unusual diarrhea, constipation, pain near the anus) TENDERNESS IN MOUTH AND THROAT WITH OR WITHOUT PRESENCE OF ULCERS (sore throat, sores in mouth, or a toothache) UNUSUAL RASH, SWELLING OR PAIN  UNUSUAL VAGINAL DISCHARGE OR ITCHING   Items with * indicate a potential emergency and should be followed up as soon as possible or go to the Emergency Department if any problems should occur.  Please show the CHEMOTHERAPY ALERT CARD or IMMUNOTHERAPY ALERT CARD at  check-in to the Emergency Department and triage nurse.  Should you have questions after your visit or need to cancel or reschedule your appointment, please contact [facility]  Dept: [phone]  and follow the prompts.  Office hours are 8:00 a.m. to 4:30 p.m. Monday - Friday. Please note that voicemails left after 4:00 p.m. may not be returned until the following business day.  We are closed weekends and major holidays. You have access to a nurse at all times for urgent questions. Please call the main number to the clinic Dept: [phone] and follow the prompts.   For any non-urgent questions, you may also contact your provider using MyChart. We now offer e-Visits for anyone 49 and older to request care online for non-urgent symptoms. For details visit [email or web address].   Also download the MyChart app! Go to the app store, search "MyChart", open the app, select [facility], and log in with your MyChart username and password.  Masks are optional in the cancer centers. If you would like for your care team to wear a mask while they are taking care of you, please let them know. You may have one support person who is at least 47 years old accompany you for your appointments.

## 2022-08-19 NOTE — Progress Notes (Signed)
Symptom Management Consult note [facility]    Patient Care Team: Sueanne Margarita, DO as PCP - General (Internal Medicine) Rockwell Germany, RN as Oncology Nurse Navigator Mauro Kaufmann, RN as Oncology Nurse Navigator    Name of the patient: Natalie Griffin  [id number]  1975-10-16   Date of visit: 08/19/2022   Chief Complaint/Reason for visit: rib pain and sore throat   Current Therapy: herceptin hycleta  Last treatment:  Day 1   Cycle 19 today   ASSESSMENT & PLAN: Patient is a 46 y.o. female  with oncologic history of metastatic breast cancer followed by Dr. Lindi Adie.  I have viewed most recent oncology note and lab work.    #) Metastatic breast cancer -History of left breast cancer with recent biopsy of right breast showing triple positive breast cancer.  Planning to see gyn onc this week to discuss oophrectomy - Next appointment with oncologist is 09/17/22   #) Right rib pain -Patient with tenderness on exam over right lower ribs.  No hypoxia and normal work of breathing. -Patient was tachycardic to 120 on arrival.  She admitted to being anxious and walking from the parking lot which caused her pain.  Vitals were rechecked and heart rate was in the 80s. -X-ray of right ribs without fracture.  I viewed imaging and agree with radiologist impression.  Discussed results with patient.  She is surprised imaging is negative with her amount of pain.  Discussed possibility of occult fracture that is not seen.  Management would not change if that was the case.  Plan is for pain control with MS Contin and OTC lidocaine patches as she previously had fentanyl patch although brought back to the clinic when she had concern for reaction which ended up to be rash from radiation.  Patient will also be given incentive spirometer. -Oncologist is agreeable with plan for pain control.  I have reviewed the PDMP during this encounter.   #)Radiation esophagitis -Patient without  mucositis. No clinical signs of dehydration.  -Will prescribe Carafate and viscous lidocaine for symptom control.  Is able to tolerate some fluid, encouraged her to slowly improve fluid intake as pain approves.   Strict ED precautions discussed should symptoms worsen.    Heme/Onc History: Oncology History  Malignant neoplasm of upper-inner quadrant of left breast in female, estrogen receptor positive ([place])  04/11/2020 Initial Diagnosis   Patient palpated a left breast lump. Mammogram on 03/08/20 showed a 3.0cm mass at the 11 o'clock position with no axillary adenopathy. 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   05/09/2020 Cancer Staging   Staging form: Breast, AJCC 8th Edition - Clinical stage from 05/09/2020: Stage IB (cT2, cN0, cM0, G2, ER+, PR+, HER2+) - Signed by Nicholas Lose, MD on 05/09/2020   06/04/2020 Surgery   Left lumpectomy ([name]): invasive and in situ ductal carcinoma, 3.2cm, clear margins, one left axillary lymph node negative for carcinoma.    02/2021 Relapse/Recurrence   Patient went to orthopedics for right hip pain.  Imaging revealed bone metastasis.  CT CAP: Cortical destruction of the right femoral neck consistent with skeletal metastases at risk for pathologic fracture.  Lucent lesion in the left iliac bone and L3 vertebral body consistent with multifocal skeletal metastases.   06/11/2021 -  Chemotherapy   Patient is on Treatment Plan : BREAST Trastuzumab SQ (Hylecta) q21d  Interval history-: Natalie Griffin is a 45 y.o. female with oncologic history as above seen in the infusion center today with chief complaint of right rib pain and sore throat.  Patient states this morning when she was wrapping a towel around herself she felt a sharp pop in her right ribs.  She thought she felt something move.  The pain is sharp and worse with movement or breathing.  Pain is currently 8 out of  10 in severity.  She tried taking a Percocet which did not provide any pain relief.  Patient also states she has a sore throat from her recent radiation.  She is able to drink fluids although does have some discomfort.  She has been drinking aloe juice primarily along with tea.  Can tolerate soft foods.  Denies any fever or chills, cough or sick contacts.      ROS  All other systems are reviewed and are negative for acute change except as noted in the HPI.    Allergies  Allergen Reactions   Adhesive [Tape] Other (See Comments)    Blistering with steri strips   Fentanyl Itching, Nausea Only and Rash    08/11/2022 reports removing patch two days ago. Experienced "rash, itching, nausea, headache and breathing" affected.      Phesgo [Pertuz-Trastuz-Hyaluron-Zzxf] Diarrhea and Rash    Phesgo side effect: Fixed drug eruption, profound diarrhea Therefore we will discontinue Phesgo and start her on Herceptin Hylecta Inj   Tetanus Toxoid Anaphylaxis   Succinylcholine Other (See Comments)    Her Body does not have enough enzymes to carry it out of her body   Contrast Media [Iodinated Contrast Media] Rash    Rash/hives     Past Medical History:  Diagnosis Date   Anemia    Bone metastasis    Breast cancer ([facility]) 2021   Left breast invasive ductal carcinoma   Cancer ([facility]) 2021   Left breast   Family history of adverse reaction to anesthesia    Father and Aunt have pseudocholinesterase deficiency - Trouble waking up from anesthesia   History of hiatal hernia    History of kidney stones    noted on CT Left nonobstructive     Past Surgical History:  Procedure Laterality Date   BREAST BIOPSY Right 07/21/2022   [place] RT BREAST BX W LOC DEV 1ST LESION IMG BX SPEC US GUIDE 07/21/2022 [facility] MAMMOGRAPHY   BREAST LUMPECTOMY WITH RADIOACTIVE SEED AND SENTINEL LYMPH NODE BIOPSY Left 06/04/2020   Procedure: LEFT BREAST LUMPECTOMY WITH RADIOACTIVE SEED AND SENTINEL LYMPH NODE MAPPING;  Surgeon:  Erroll Luna, MD;  Location: [facility];  Service: General;  Laterality: Left;  PEC BLOCK, RNFA   FEMUR IM NAIL Right 04/01/2021   Procedure: INTRAMEDULLARY (IM) NAIL FEMORAL;  Surgeon: [name], MD;  Location: [facility];  Service: Orthopedics;  Laterality: Right;   WISDOM TOOTH EXTRACTION      Social History   Socioeconomic History   Marital status: Single    Spouse name: Not on file   Number of children: Not on file   Years of education: Not on file   Highest education level: Not on file  Occupational History   Not on file  Tobacco Use   Smoking status: Former    Types: Cigarettes    Quit date: 05/19/2020    Years since quitting: 2.2   Smokeless tobacco: Never  Vaping Use   Vaping Use: Never used  Substance and Sexual Activity   Alcohol use:  Yes    Comment: social drinker   Drug use: Never   Sexual activity: Not Currently  Other Topics Concern   Not on file  Social History Narrative   Not on file   Social Determinants of Health   Financial Resource Strain: Not on file  Food Insecurity: Not on file  Transportation Needs: Not on file  Physical Activity: Not on file  Stress: Not on file  Social Connections: Not on file  Intimate Partner Violence: Not At Risk (05/06/2021)   Humiliation, Afraid, Rape, and Kick questionnaire    Fear of Current or Ex-Partner: No    Emotionally Abused: No    Physically Abused: No    Sexually Abused: No    Family History  Problem Relation Age of Onset   Colon cancer Neg Hx    Esophageal cancer Neg Hx    Stomach cancer Neg Hx    Rectal cancer Neg Hx      Current Outpatient Medications:    lidocaine (XYLOCAINE) 2 % solution, Use as directed 15 mLs in the mouth or throat every 6 (six) hours as needed for mouth pain., Disp: 100 mL, Rfl: 0   morphine (MS CONTIN) 30 MG 12 hr tablet, Take 1 tablet (30 mg total) by mouth every 12 (twelve) hours for 7 days., Disp: 14 tablet, Rfl: 0   sucralfate (CARAFATE) 1 g tablet, Take 1 tablet (1 g  total) by mouth 4 (four) times daily -  with meals and at bedtime. Dissolve water in 8z of water, Disp: 120 tablet, Rfl: 0   ALPRAZolam (XANAX) 0.5 MG tablet, Take 1 tablet (0.5 mg total) by mouth 3 (three) times daily as needed for anxiety., Disp: 60 tablet, Rfl: 3   calcium carbonate (OS-CAL) 600 MG TABS tablet, Take 600 mg by mouth daily with breakfast., Disp: , Rfl:    dexamethasone (DECADRON) 2 MG tablet, Take 1 tablet (2 mg total) by mouth daily., Disp: 30 tablet, Rfl: 3   ferrous sulfate 324 MG TBEC, Take 324 mg by mouth., Disp: , Rfl:    Multiple Vitamins-Minerals (MULTIVITAMIN WITH MINERALS) tablet, Take 1 tablet by mouth daily., Disp: , Rfl:    oxyCODONE-acetaminophen (PERCOCET) 10-325 MG tablet, Take 1 tablet by mouth every 8 (eight) hours as needed for pain., Disp: 90 tablet, Rfl: 0   tamoxifen (NOLVADEX) 20 MG tablet, Take 1 tablet (20 mg total) by mouth daily., Disp: 90 tablet, Rfl: 3   Vitamin D, Cholecalciferol, 25 MCG (1000 UT) TABS, Take 1 tablet by mouth daily., Disp: 60 tablet, Rfl:   PHYSICAL EXAM: ECOG FS:1 - Symptomatic but completely ambulatory   T: 98   BP: 126/83    HR: 81      O2: 95% RA Physical Exam Vitals and nursing note reviewed.  Constitutional:      Appearance: She is well-developed. She is not ill-appearing or toxic-appearing.  HENT:     Head: Normocephalic.     Nose: Nose normal.     Mouth/Throat:     Mouth: Mucous membranes are moist.     Comments: No sores to oral mucosa Eyes:     Conjunctiva/sclera: Conjunctivae normal.  Neck:     Vascular: No JVD.  Cardiovascular:     Rate and Rhythm: Normal rate and regular rhythm.     Pulses: Normal pulses.     Heart sounds: Normal heart sounds.  Pulmonary:     Effort: Pulmonary effort is normal.     Breath sounds: Normal breath sounds.  Comments: No hypoxia. Clear lung sounds Chest:     Comments: Right rib tenderness over #7 and 8. No deformity or eccymosis  Peeling on sternum s/p radiation  therapy Abdominal:     General: There is no distension.  Musculoskeletal:     Cervical back: Normal range of motion.  Skin:    General: Skin is warm and dry.  Neurological:     Mental Status: She is oriented to person, place, and time.        LABORATORY DATA: I have reviewed the data as listed    Latest Ref Rng & Units 08/06/2022    1:13 PM 06/18/2022    1:20 PM 04/15/2022    1:49 PM  CBC  WBC 4.0 - 10.5 K/uL 9.0  13.0  8.9   Hemoglobin 12.0 - 15.0 g/dL 10.7  11.7  12.4   Hematocrit 36.0 - 46.0 % 32.9  34.8  37.5   Platelets 150 - 400 K/uL 288  299  300         Latest Ref Rng & Units 08/06/2022    1:13 PM 06/18/2022    1:20 PM 04/15/2022    1:49 PM  CMP  Glucose 70 - 99 mg/dL 148  121  114   BUN 6 - 20 mg/dL _0 Creatinine 0.44 - 1.00 mg/dL 0.83  0.78  0.79   Sodium 135 - 145 mmol/L 139  139  141   Potassium 3.5 - 5.1 mmol/L 4.2  4.1  4.0   Chloride 98 - 111 mmol/L 107  103  110   CO2 22 - 32 mmol/L _1 Calcium 8.9 - 10.3 mg/dL 10.0  10.1  9.6   Total Protein 6.5 - 8.1 g/dL 7.7  8.0  8.0   Total Bilirubin 0.3 - 1.2 mg/dL 0.2  0.4  0.2   Alkaline Phos 38 - 126 U/L 126  72  40   AST 15 - 41 U/L 27  33  31   ALT 0 - 44 U/L 23  27  32        RADIOGRAPHIC STUDIES (from last 24 hours if applicable) I have personally reviewed the radiological images as listed and agreed with the findings in the report. DG Ribs Unilateral Right  Result Date: 08/19/2022 CLINICAL DATA:  Right rib pain beginning today. Metastatic breast carcinoma. EXAM: RIGHT RIBS - 2 VIEW COMPARISON:  None Available. FINDINGS: No fracture or other bone lesions are seen involving the ribs. No evidence of right-sided pneumothorax or pleural effusion. IMPRESSION: Negative. Electronically Signed   By: Marlaine Hind M.D.   On: 08/19/2022 17:04        Visit Diagnosis: 1. Malignant neoplasm metastatic to bone (HCC)   2. Rib pain on right side   3. Radiation-induced esophagitis       Orders Placed This Encounter  Procedures   DG Ribs Unilateral Right    Standing Status:   Future    Number of Occurrences:   1    Standing Expiration Date:   08/20/2023    Order Specific Question:   Reason for Exam (SYMPTOM  OR DIAGNOSIS REQUIRED)    Answer:   hx bone mets , rib pain    Order Specific Question:   Is patient pregnant?    Answer:   No    Order Specific Question:   Preferred imaging location?    Answer:   [facility]  All questions were answered. The patient knows to call the clinic with any problems, questions or concerns. No barriers to learning was detected.  I have spent a total of 30 minutes minutes of face-to-face and non-face-to-face time, preparing to see the patient, obtaining and/or reviewing separately obtained history, performing a medically appropriate examination, counseling and educating the patient, ordering tests, documenting clinical information in the electronic health record, and care coordination (communications with other health care professionals or caregivers).    Thank you for allowing me to participate in the care of this patient.    Barrie Folk, PA-C Department of Hematology/Oncology [facility] at [facility] Phone: [phone]  [phone]    08/19/2022 6:07 PM

## 2022-08-19 NOTE — Progress Notes (Signed)
Patient arrived for her Herceptin Hylecta injection. Reported a 8/10 pain in her right ribs/ throat. Patient recently received radiation. After taking vital signs her Pulse was 120-130s and BP elevated for her 141/100. Contacted Dr. Lindi Adie. He advised that she be seen by Symptom management clinic. SM Provider contacted and appt made.

## 2022-08-20 ENCOUNTER — Ambulatory Visit: Payer: 59

## 2022-08-20 ENCOUNTER — Encounter: Payer: Self-pay | Admitting: Physician Assistant

## 2022-08-20 ENCOUNTER — Other Ambulatory Visit (HOSPITAL_COMMUNITY): Payer: Self-pay

## 2022-08-21 ENCOUNTER — Encounter: Payer: Self-pay | Admitting: Gynecologic Oncology

## 2022-08-21 ENCOUNTER — Inpatient Hospital Stay: Payer: 59 | Attending: Hematology and Oncology | Admitting: Gynecologic Oncology

## 2022-08-21 VITALS — BP 133/76 | HR 84 | Temp 97.9°F | Resp 18 | Ht 65.0 in | Wt 177.4 lb

## 2022-08-21 DIAGNOSIS — Z79810 Long term (current) use of selective estrogen receptor modulators (SERMs): Secondary | ICD-10-CM

## 2022-08-21 DIAGNOSIS — Z170 Estrogen receptor positive status [ER+]: Secondary | ICD-10-CM | POA: Diagnosis not present

## 2022-08-21 DIAGNOSIS — Z148 Genetic carrier of other disease: Secondary | ICD-10-CM | POA: Insufficient documentation

## 2022-08-21 DIAGNOSIS — Z7952 Long term (current) use of systemic steroids: Secondary | ICD-10-CM | POA: Insufficient documentation

## 2022-08-21 DIAGNOSIS — G893 Neoplasm related pain (acute) (chronic): Secondary | ICD-10-CM | POA: Insufficient documentation

## 2022-08-21 DIAGNOSIS — Z1509 Genetic susceptibility to other malignant neoplasm: Secondary | ICD-10-CM | POA: Insufficient documentation

## 2022-08-21 DIAGNOSIS — R0781 Pleurodynia: Secondary | ICD-10-CM

## 2022-08-21 DIAGNOSIS — C7951 Secondary malignant neoplasm of bone: Secondary | ICD-10-CM

## 2022-08-21 DIAGNOSIS — C50919 Malignant neoplasm of unspecified site of unspecified female breast: Secondary | ICD-10-CM

## 2022-08-21 DIAGNOSIS — Z1502 Genetic susceptibility to malignant neoplasm of ovary: Secondary | ICD-10-CM | POA: Diagnosis not present

## 2022-08-21 DIAGNOSIS — Z1501 Genetic susceptibility to malignant neoplasm of breast: Secondary | ICD-10-CM | POA: Diagnosis not present

## 2022-08-21 DIAGNOSIS — Z853 Personal history of malignant neoplasm of breast: Secondary | ICD-10-CM | POA: Insufficient documentation

## 2022-08-21 NOTE — Progress Notes (Unsigned)
GYNECOLOGIC ONCOLOGY NEW PATIENT CONSULTATION   Patient Name: Natalie Griffin  Patient Age: 46 y.o. 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    I had discussion with Ms. Heideman regarding the rationale for therapeutic removal of the ovaries. She has an estrogen receptor positive breast cancer and her medical oncologist is recommending ablation of ovarian function.  I discussed that ovarian ablation can be permanent (oophorectomy) or reversible (eg Lupron).  While there is limited high-quality evidence, some have proposed that oophorectomy be gold standard ablative therapy.  Patient is aware that oophorectomy (or long-term Lupron use) could increase her risk for osteoporosis, cardiovascular disease, hot flashes, and depression/anxiety.   We discussed that oophorectomy would make her menopausal and a candidate for aromatase inhibitor therapy (rather than SERM).  Her medical oncologist is recommending transitioning to an AI after ovarian suppression.  Additionally, we also discussed the pros and cons of removing the uterus. This is optional, but there is about a three-fold increased risk of uterine cancer in women with breast cancer who are treated with Tamoxifen. We discussed that given plan to transition to an AI and desire to limit surgical morbidity and decrease recovery time, we will plan for BSO without hysterectomy. This will be done in a minimally invasive manner.  Vici would like to schedule surgery in January. She will return for pre-op closer to the date of surgery.   My office will reach out to the patient's OBGYN for records including last pap test (to assure she is up to date on cervical cancer screening) and her genetic testing report.   A copy of this note was sent to the patient's referring  provider.   55 minutes of total time was spent for this patient encounter, including preparation, face-to-face counseling with the patient and coordination of care, and documentation of the encounter.  Jeral Pinch, MD  Division of Gynecologic Oncology  Department of Obstetrics and Gynecology  [facility]  ___________________________________________  Chief Complaint: Chief Complaint  Patient presents with   Malignant neoplasm of breast in female, estrogen receptor p    History of Present Illness:  Natalie Griffin is a 46 y.o. y.o. female who is seen in consultation at the request of Dr. Lindi Adie for an evaluation of therapeutic BSO.  Her breast cancer diagnosed in 2021, treated with surgery. She decline adjuvant chemotherapy, radiation and antiestrogen therapy. She presented with right hip pain in 02/2021 and imaging revealed bony mets.  She was subsequently diagnosed with recurrent and metastatic estrogen receptor breast cancer.  She is currently on Herceptin and tamoxifen.  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   The patient reports several days of increased right rib pain.  Her weight has been somewhat up-and-down related to surgery and being on steroids.  She has increased appetite right now because of steroids.  She denies any nausea or emesis.  She has some slight constipation, uses prunes and prune juice with good relief.  She denies any bladder symptoms.  She denies any pelvic pain.  Her menses were regular until she started tamoxifen.  They are now somewhat irregular.  Her last cycle was on 10/20.  She describes a history of normal menses with average blood flow.  She is currently on disability.  She previously worked as  an MRI tech.  PAST MEDICAL HISTORY:  Past Medical History:  Diagnosis Date   Anemia    Bone metastasis    Breast cancer ([place]) 2021   Left breast invasive ductal  carcinoma   Cancer ([place]) 2021   Left breast   Family history of adverse reaction to anesthesia    Father and Aunt have pseudocholinesterase deficiency - Trouble waking up from anesthesia   History of hiatal hernia    History of kidney stones    noted on CT Left nonobstructive     PAST SURGICAL HISTORY:  Past Surgical History:  Procedure Laterality Date   BREAST BIOPSY Right 07/21/2022   [place] RT BREAST BX W LOC DEV 1ST LESION IMG BX SPEC US GUIDE 07/21/2022 [facility]   BREAST LUMPECTOMY WITH RADIOACTIVE SEED AND SENTINEL LYMPH NODE BIOPSY Left 06/04/2020   Procedure: LEFT BREAST LUMPECTOMY WITH RADIOACTIVE SEED AND SENTINEL LYMPH NODE MAPPING;  Surgeon: Erroll Luna, MD;  Location: [facility];  Service: General;  Laterality: Left;  PEC BLOCK, RNFA   FEMUR IM NAIL Right 04/01/2021   Procedure: INTRAMEDULLARY (IM) NAIL FEMORAL;  Surgeon: [name], MD;  Location: [facility];  Service: Orthopedics;  Laterality: Right;   WISDOM TOOTH EXTRACTION      OB/GYN HISTORY:  OB History  Gravida Para Term Preterm AB Living  1 1          SAB IAB Ectopic Multiple Live Births               # Outcome Date GA Lbr Len/2nd Weight Sex Delivery Anes PTL Lv  1 Para             No LMP recorded.  Age at menarche: 57 Age at menopause: Not applicable Hx of HRT: Not applicable Hx of STDs: Denies Last pap: 2023 History of abnormal pap smears: Remote history of an abnormal Pap in 1994 immediately after her delivery.  Remembers having a colposcopy with biopsies but no further work-up  SCREENING STUDIES:  Last mammogram: 2023  Last colonoscopy: 05/2022  MEDICATIONS: Outpatient Encounter Medications as of 08/21/2022  Medication Sig   ALPRAZolam (XANAX) 0.5 MG tablet Take 1 tablet (0.5 mg total) by mouth 3 (three) times daily as needed for anxiety.   calcium carbonate (OS-CAL) 600 MG TABS tablet Take 600 mg by mouth daily with breakfast.   dexamethasone (DECADRON) 2 MG tablet Take 1 tablet (2 mg  total) by mouth daily.   ferrous sulfate 324 MG TBEC Take 324 mg by mouth.   Multiple Vitamins-Minerals (MULTIVITAMIN WITH MINERALS) tablet Take 1 tablet by mouth daily.   oxyCODONE-acetaminophen (PERCOCET) 10-325 MG tablet Take 1 tablet by mouth every 8 (eight) hours as needed for pain.   tamoxifen (NOLVADEX) 20 MG tablet Take 1 tablet (20 mg total) by mouth daily.   Vitamin D, Cholecalciferol, 25 MCG (1000 UT) TABS Take 1 tablet by mouth daily.   [DISCONTINUED] acetaminophen (TYLENOL) 500 MG tablet Take 2 tablets (1,000 mg total) by mouth every 8 (eight) hours as needed for mild pain   [DISCONTINUED] amphetamine-dextroamphetamine (ADDERALL) 10 MG tablet Take 1 tablet (10 mg total) by mouth 2 (two) times daily as needed.   [DISCONTINUED] tiZANidine (ZANAFLEX) 4 MG tablet Take 1 tablet (4 mg total) by mouth every 8 (eight) hours as needed for muscle spasms.   No facility-administered encounter medications on file as of 08/21/2022.    ALLERGIES:  Allergies  Allergen Reactions   Adhesive [Tape] Other (See Comments)  Blistering with steri strips   Fentanyl Itching, Nausea Only and Rash    08/11/2022 reports removing patch two days ago. Experienced "rash, itching, nausea, headache and breathing" affected.      Phesgo [Pertuz-Trastuz-Hyaluron-Zzxf] Diarrhea and Rash    Phesgo side effect: Fixed drug eruption, profound diarrhea Therefore we will discontinue Phesgo and start her on Herceptin Hylecta Inj   Tetanus Toxoid Anaphylaxis   Succinylcholine Other (See Comments)    Her Body does not have enough enzymes to carry it out of her body   Contrast Media [Iodinated Contrast Media] Rash    Rash/hives     FAMILY HISTORY:  Family History  Problem Relation Age of Onset   Colon cancer Neg Hx    Esophageal cancer Neg Hx    Stomach cancer Neg Hx    Rectal cancer Neg Hx      SOCIAL HISTORY:  Social Connections: Not on file    REVIEW OF SYSTEMS:  + hearing loss, esophagitis from  radiation treatment, right inferior rib pain Denies appetite changes, fevers, chills, fatigue, unexplained weight changes. Denies hearing loss, neck lumps or masses, mouth sores, ringing in ears or voice changes. Denies cough or wheezing.  Denies shortness of breath. Denies chest pain or palpitations. Denies leg swelling. Denies abdominal distention, pain, blood in stools, constipation, diarrhea, nausea, vomiting, or early satiety. Denies pain with intercourse, dysuria, frequency, hematuria or incontinence. Denies hot flashes, pelvic pain, vaginal bleeding or vaginal discharge.   Denies joint pain, back pain or muscle pain/cramps. Denies itching, rash, or wounds. Denies dizziness, headaches, numbness or seizures. Denies swollen lymph nodes or glands, denies easy bruising or bleeding. Denies anxiety, depression, confusion, or decreased concentration.  Physical Exam:  Vital Signs for this encounter:  Blood pressure 133/76, pulse 84, temperature 97.9 F (36.6 C), temperature source Temporal, resp. rate 18, height 5' 5" (1.651 m), weight 177 lb 6 oz (80.5 kg), SpO2 98 %. Body mass index is 29.52 kg/m. General: Alert, oriented, no acute distress.  HEENT: Normocephalic, atraumatic. Sclera anicteric.  Chest: Clear to auscultation bilaterally. No wheezes, rhonchi, or rales. Cardiovascular: Regular rate and rhythm, no murmurs, rubs, or gallops.  Abdomen: Normoactive bowel sounds. Soft, nondistended, nontender to palpation although did not palpate in right upper quadrant secondary to her rib pain. No masses or hepatosplenomegaly appreciated. No palpable fluid wave.  Extremities: Grossly normal range of motion. Warm, well perfused. No edema bilaterally.  Skin: No rashes or lesions.  Lymphatics: No cervical, supraclavicular, or inguinal adenopathy.  GU:  Normal external female genitalia. No lesions. No discharge or bleeding.             Bladder/urethra:  No lesions or masses, well supported  bladder             Vagina: Well-rugated, no lesions noted.             Cervix: Normal appearing, no lesions.             Uterus:  Small, mobile, no parametrial involvement or nodularity.             Adnexa: No masses appreciated.  Rectal: Deferred.  LABORATORY AND RADIOLOGIC DATA:  Outside medical records were reviewed to synthesize the above history, along with the history and physical obtained during the visit.   Lab Results  Component Value Date   WBC 9.0 08/06/2022   HGB 10.7 (L) 08/06/2022   HCT 32.9 (L) 08/06/2022   PLT 288 08/06/2022   GLUCOSE 148 (H) 08/06/2022  ALT 23 08/06/2022   AST 27 08/06/2022   NA 139 08/06/2022   K 4.2 08/06/2022   CL 107 08/06/2022   CREATININE 0.83 08/06/2022   BUN 25 (H) 08/06/2022   CO2 21 (L) 08/06/2022

## 2022-08-21 NOTE — Patient Instructions (Signed)
We will tentatively plan for surgery at [facility] with Dr. Jeral Pinch on September 30, 2022. We will see you back in the office closer to the date for a preop appointment with Joylene John NP to discuss the instrcutions for before and after surgery.  You may also receive a phone call from the hospital to arrange for a pre-op appointment there as well. Usually both appointments can be combined on the same day.

## 2022-08-22 ENCOUNTER — Encounter: Payer: Self-pay | Admitting: Hematology and Oncology

## 2022-08-22 ENCOUNTER — Other Ambulatory Visit: Payer: Self-pay

## 2022-08-24 ENCOUNTER — Other Ambulatory Visit: Payer: Self-pay | Admitting: Hematology and Oncology

## 2022-08-25 ENCOUNTER — Other Ambulatory Visit: Payer: Self-pay | Admitting: Gynecologic Oncology

## 2022-08-25 ENCOUNTER — Other Ambulatory Visit (HOSPITAL_COMMUNITY): Payer: Self-pay

## 2022-08-25 DIAGNOSIS — C7951 Secondary malignant neoplasm of bone: Secondary | ICD-10-CM

## 2022-08-25 DIAGNOSIS — C50919 Malignant neoplasm of unspecified site of unspecified female breast: Secondary | ICD-10-CM

## 2022-08-25 MED ORDER — ALPRAZOLAM 0.5 MG PO TABS
0.5000 mg | ORAL_TABLET | Freq: Three times a day (TID) | ORAL | 3 refills | Status: DC | PRN
  Filled 2022-08-25: qty 60, 20d supply, fill #0
  Filled 2022-09-21 – 2022-09-28 (×2): qty 60, 20d supply, fill #1
  Filled 2022-11-11: qty 60, 20d supply, fill #2
  Filled 2022-12-07: qty 60, 20d supply, fill #3

## 2022-08-26 ENCOUNTER — Other Ambulatory Visit (HOSPITAL_COMMUNITY): Payer: Self-pay

## 2022-08-28 ENCOUNTER — Telehealth: Payer: Self-pay

## 2022-08-28 NOTE — Telephone Encounter (Signed)
Patient notified of completion of Attending Physician Statements for Disability claim. Fax transmission confirmations received. Copy of forms mailed to Patient as requested.

## 2022-08-31 ENCOUNTER — Other Ambulatory Visit: Payer: Self-pay

## 2022-08-31 ENCOUNTER — Other Ambulatory Visit: Payer: Self-pay | Admitting: Hematology and Oncology

## 2022-08-31 MED ORDER — OXYCODONE-ACETAMINOPHEN 10-325 MG PO TABS
1.0000 | ORAL_TABLET | Freq: Three times a day (TID) | ORAL | 0 refills | Status: DC | PRN
  Filled 2022-08-31: qty 90, 30d supply, fill #0

## 2022-09-01 ENCOUNTER — Other Ambulatory Visit (HOSPITAL_COMMUNITY): Payer: Self-pay

## 2022-09-03 ENCOUNTER — Encounter: Payer: Self-pay | Admitting: Hematology and Oncology

## 2022-09-04 ENCOUNTER — Encounter: Payer: Self-pay | Admitting: Hematology and Oncology

## 2022-09-04 ENCOUNTER — Other Ambulatory Visit: Payer: Self-pay | Admitting: Hematology and Oncology

## 2022-09-04 ENCOUNTER — Other Ambulatory Visit (HOSPITAL_COMMUNITY): Payer: Self-pay

## 2022-09-04 MED ORDER — MORPHINE SULFATE ER 30 MG PO TBCR
30.0000 mg | EXTENDED_RELEASE_TABLET | Freq: Two times a day (BID) | ORAL | 0 refills | Status: DC
  Filled 2022-09-04: qty 60, 30d supply, fill #0

## 2022-09-04 NOTE — Progress Notes (Signed)
Sent a 1 month prescription for MS Contin 30 mg p.o. twice daily.

## 2022-09-05 ENCOUNTER — Encounter: Payer: Self-pay | Admitting: Hematology and Oncology

## 2022-09-09 ENCOUNTER — Ambulatory Visit: Payer: 59

## 2022-09-10 ENCOUNTER — Other Ambulatory Visit (HOSPITAL_COMMUNITY): Payer: Self-pay

## 2022-09-10 ENCOUNTER — Other Ambulatory Visit: Payer: Self-pay

## 2022-09-10 ENCOUNTER — Other Ambulatory Visit: Payer: Self-pay | Admitting: Hematology and Oncology

## 2022-09-10 MED ORDER — TAMOXIFEN CITRATE 20 MG PO TABS
20.0000 mg | ORAL_TABLET | Freq: Every day | ORAL | 0 refills | Status: DC
  Filled 2022-09-10: qty 30, 30d supply, fill #0

## 2022-09-10 NOTE — Progress Notes (Signed)
Rx refill request for tamoxifen received. Per Dr. Alvy Bimler last note Pt will switch to anastrozole post oophorectomy scheduled for 09/30/22. Pt also has a follow up visit with Wilber Bihari, PA 09/17/22. This RN filled a 30 day supply with no refills to carry Pt until 09/30/22.

## 2022-09-10 NOTE — Telephone Encounter (Signed)
Pt needs follow up before rx can be filled for 90 days. Per Dr. Alvy Bimler last note Pt will switch to anastrozole after 09/30/22.

## 2022-09-15 ENCOUNTER — Other Ambulatory Visit: Payer: Self-pay

## 2022-09-15 DIAGNOSIS — C7951 Secondary malignant neoplasm of bone: Secondary | ICD-10-CM

## 2022-09-16 ENCOUNTER — Encounter: Payer: Self-pay | Admitting: Hematology and Oncology

## 2022-09-17 ENCOUNTER — Telehealth: Payer: Self-pay

## 2022-09-17 ENCOUNTER — Inpatient Hospital Stay: Payer: 59

## 2022-09-17 ENCOUNTER — Encounter: Payer: Self-pay | Admitting: Adult Health

## 2022-09-17 ENCOUNTER — Encounter: Payer: Self-pay | Admitting: Physician Assistant

## 2022-09-17 ENCOUNTER — Inpatient Hospital Stay (HOSPITAL_BASED_OUTPATIENT_CLINIC_OR_DEPARTMENT_OTHER): Payer: 59 | Admitting: Adult Health

## 2022-09-17 VITALS — BP 138/83 | HR 70 | Resp 18 | Ht 65.0 in | Wt 180.1 lb

## 2022-09-17 DIAGNOSIS — G893 Neoplasm related pain (acute) (chronic): Secondary | ICD-10-CM | POA: Diagnosis not present

## 2022-09-17 DIAGNOSIS — Z1502 Genetic susceptibility to malignant neoplasm of ovary: Secondary | ICD-10-CM | POA: Diagnosis not present

## 2022-09-17 DIAGNOSIS — C50212 Malignant neoplasm of upper-inner quadrant of left female breast: Secondary | ICD-10-CM

## 2022-09-17 DIAGNOSIS — Z148 Genetic carrier of other disease: Secondary | ICD-10-CM | POA: Diagnosis not present

## 2022-09-17 DIAGNOSIS — Z1501 Genetic susceptibility to malignant neoplasm of breast: Secondary | ICD-10-CM | POA: Diagnosis not present

## 2022-09-17 DIAGNOSIS — Z853 Personal history of malignant neoplasm of breast: Secondary | ICD-10-CM | POA: Diagnosis not present

## 2022-09-17 DIAGNOSIS — Z170 Estrogen receptor positive status [ER+]: Secondary | ICD-10-CM | POA: Diagnosis not present

## 2022-09-17 DIAGNOSIS — C7951 Secondary malignant neoplasm of bone: Secondary | ICD-10-CM

## 2022-09-17 DIAGNOSIS — Z1509 Genetic susceptibility to other malignant neoplasm: Secondary | ICD-10-CM | POA: Diagnosis not present

## 2022-09-17 DIAGNOSIS — Z79810 Long term (current) use of selective estrogen receptor modulators (SERMs): Secondary | ICD-10-CM | POA: Diagnosis not present

## 2022-09-17 LAB — CBC WITH DIFFERENTIAL (CANCER CENTER ONLY)
Abs Immature Granulocytes: 0.04 10*3/uL (ref 0.00–0.07)
Basophils Absolute: 0 10*3/uL (ref 0.0–0.1)
Basophils Relative: 0 %
Eosinophils Absolute: 0.3 10*3/uL (ref 0.0–0.5)
Eosinophils Relative: 4 %
HCT: 33.5 % — ABNORMAL LOW (ref 36.0–46.0)
Hemoglobin: 10.9 g/dL — ABNORMAL LOW (ref 12.0–15.0)
Immature Granulocytes: 1 %
Lymphocytes Relative: 17 %
Lymphs Abs: 1.2 10*3/uL (ref 0.7–4.0)
MCH: 29.6 pg (ref 26.0–34.0)
MCHC: 32.5 g/dL (ref 30.0–36.0)
MCV: 91 fL (ref 80.0–100.0)
Monocytes Absolute: 0.6 10*3/uL (ref 0.1–1.0)
Monocytes Relative: 10 %
Neutro Abs: 4.6 10*3/uL (ref 1.7–7.7)
Neutrophils Relative %: 68 %
Platelet Count: 279 10*3/uL (ref 150–400)
RBC: 3.68 MIL/uL — ABNORMAL LOW (ref 3.87–5.11)
RDW: 14.6 % (ref 11.5–15.5)
WBC Count: 6.7 10*3/uL (ref 4.0–10.5)
nRBC: 0 % (ref 0.0–0.2)

## 2022-09-17 LAB — CMP (CANCER CENTER ONLY)
ALT: 40 U/L (ref 0–44)
AST: 45 U/L — ABNORMAL HIGH (ref 15–41)
Albumin: 4 g/dL (ref 3.5–5.0)
Alkaline Phosphatase: 193 U/L — ABNORMAL HIGH (ref 38–126)
Anion gap: 9 (ref 5–15)
BUN: 13 mg/dL (ref 6–20)
CO2: 27 mmol/L (ref 22–32)
Calcium: 9.9 mg/dL (ref 8.9–10.3)
Chloride: 105 mmol/L (ref 98–111)
Creatinine: 0.79 mg/dL (ref 0.44–1.00)
GFR, Estimated: >60 mL/min (ref >60)
Glucose, Bld: 114 mg/dL — ABNORMAL HIGH (ref 70–99)
Potassium: 4.2 mmol/L (ref 3.5–5.1)
Sodium: 141 mmol/L (ref 135–145)
Total Bilirubin: 0.2 mg/dL — ABNORMAL LOW (ref 0.3–1.2)
Total Protein: 7.4 g/dL (ref 6.5–8.1)

## 2022-09-17 MED ORDER — TRASTUZUMAB-HYALURONIDASE-OYSK 600-10000 MG-UNT/5ML ~~LOC~~ SOLN
600.0000 mg | Freq: Once | SUBCUTANEOUS | Status: AC
  Administered 2022-09-17: 600 mg via SUBCUTANEOUS
  Filled 2022-09-17: qty 5

## 2022-09-17 NOTE — Assessment & Plan Note (Signed)
Natalie Griffin is a 46 year old woman with stage IV breast cancer on treatment with Herceptin.  She will proceed with Herceptin today.  She has not considered any additional or change in treatment as she is trying to get through the next 2 weeks.  She plans to undergo oophorectomy in January at which point she hopes to change to anastrozole and Kadcyla.  We discussed her pain medication and she is managing her pain relatively well.  She will continue this.  I let her know that we are happy to place a referral for her to see radiation again if she decides to do so for her pain.  We discussed signs and symptoms of cord compression and she knows when to call if she is having any worsening pain.  We will see Samamtha back in early January for follow-up to discuss treatment changes at that time.

## 2022-09-17 NOTE — Progress Notes (Signed)
Okay to proceed with treatment per Eagar with echo from 9/18

## 2022-09-17 NOTE — Telephone Encounter (Signed)
Notified Patient of completion of Cancer Deferment Request Form. Copy of Form mailed to Patient as requested. No other needs or concerns voiced at this time.

## 2022-09-17 NOTE — Progress Notes (Signed)
[facility] Follow up:    Natalie Margarita, DO [address] [place] [address]   DIAGNOSIS:  Cancer Staging  Malignant neoplasm of upper-inner quadrant of left breast in female, estrogen receptor positive (Natalie Griffin) Staging form: Breast, AJCC 8th Edition - Clinical stage from 05/09/2020: Stage IB (cT2, cN0, cM0, G2, ER+, PR+, HER2+) - Signed by Nicholas Lose, MD on 05/09/2020 Stage prefix: Initial diagnosis Histologic grading system: 3 grade system   SUMMARY OF ONCOLOGIC HISTORY: Oncology History  Malignant neoplasm of upper-inner quadrant of left breast in female, estrogen receptor positive (Natalie Griffin)  04/11/2020 Initial Diagnosis   Patient palpated a left breast lump. Mammogram on 03/08/20 showed a 3.0cm mass at the 11 o'clock position with no axillary adenopathy. 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   05/09/2020 Cancer Staging   Staging form: Breast, AJCC 8th Edition - Clinical stage from 05/09/2020: Stage IB (cT2, cN0, cM0, G2, ER+, PR+, HER2+) - Signed by Nicholas Lose, MD on 05/09/2020   06/04/2020 Surgery   Left lumpectomy ([name]): invasive and in situ ductal carcinoma, 3.2cm, clear margins, one left axillary lymph node negative for carcinoma.    02/2021 Relapse/Recurrence   Patient went to orthopedics for right hip pain.  Imaging revealed bone metastasis.  CT CAP: Cortical destruction of the right femoral neck consistent with skeletal metastases at risk for pathologic fracture.  Lucent lesion in the left iliac bone and L3 vertebral body consistent with multifocal skeletal metastases.   04/01/2021 Surgery   04/01/2021: Femoral intramedullary nail  Pathology review: Metastatic breast cancer ER 2% PR 0% HER2 3+ positive   04/08/2021 -  Anti-estrogen oral therapy   Tamoxifen 20 mg was prescribed on 04/08/2021 (discontinued by patient because she felt that the risks and benefits did not support  it )--resumed in 07/2022 based on pathology results from right breast biopsy   05/14/2021 -  Chemotherapy   Subcutaneous Herceptin Perjeta (Phesgo) injection started 05/14/2021, switched to Herceptin Hylecta 06/11/2021 (Fixed drug eruption, profound diarrhea to Phesgo)     05/20/2021 - 06/03/2021 Radiation Therapy   Palliative radiation to the iliac bone and hip   02/04/2022 Imaging   MRI thoracic spine 02/04/2022: Numerous bone metastases thoracic spine cervical and lumbar spines largest T5, T8, T11.  Chronic compression fractures T4, T5, T10 and T12    05/07/2022 Imaging   CT chest abdomen pelvis: No new or progressive bone metastases.  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   07/29/2022 PET scan   IMPRESSION: 1. Hypermetabolic mass in the posterior deep RIGHT breast consistent primary breast carcinoma. 2. Central nodal mediastinal metastasis to the high LEFT prevascular space and LEFT super clavicular node. 3. 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  Natalie Griffin recommended changing treatment to [place] wanted to wait until after the holidays.  She will discuss further with Dr. Lindi Adie in January.     CURRENT THERAPY: Herceptin/tamoxifen  INTERVAL HISTORY: Natalie Griffin 46 y.o. female returns for follow-up of her metastatic breast cancer.  She was delayed getting here because of some confusion with her labs scheduling and she has not yet had any labs done today.  She tells  me she is feeling moderately well she is having pain in her lower back at the iliac crest and along with in her left ribs.  She is taking MS Contin and Percocet to help with the pain.  She denies any focal weakness in her lower  extremities any increased lower extremity weakness and she has no urinary incontinence or bowel incontinence, numbness or tingling.  She tolerates the Herceptin well and tells me she is planning on undergoing oophorectomy in January so she can change from tamoxifen to anastrozole.   Patient Active Problem List   Diagnosis Date Noted   Malignant neoplasm metastatic to bone ([place]) 10/15/2021   Pathological fracture of right hip ([place]) 04/01/2021   Malignant neoplasm of upper-inner quadrant of left breast in female, estrogen receptor positive ([place]) 05/09/2020    is allergic to adhesive [tape], fentanyl, phesgo [pertuz-trastuz-hyaluron-zzxf], tetanus toxoid, succinylcholine, and contrast media [iodinated contrast media].  MEDICAL HISTORY: Past Medical History:  Diagnosis Date   Anemia    Bone metastasis    Breast cancer ([place]) 2021   Left breast invasive ductal carcinoma   Cancer ([place]) 2021   Left breast   Family history of adverse reaction to anesthesia    Father and Aunt have pseudocholinesterase deficiency - Trouble waking up from anesthesia   History of hiatal hernia    History of kidney stones    noted on CT Left nonobstructive    SURGICAL HISTORY: Past Surgical History:  Procedure Laterality Date   BREAST BIOPSY Right 07/21/2022   [place] RT BREAST BX W LOC DEV 1ST LESION IMG BX SPEC US GUIDE 07/21/2022 [facility] MAMMOGRAPHY   BREAST LUMPECTOMY WITH RADIOACTIVE SEED AND SENTINEL LYMPH NODE BIOPSY Left 06/04/2020   Procedure: LEFT BREAST LUMPECTOMY WITH RADIOACTIVE SEED AND SENTINEL LYMPH NODE MAPPING;  Surgeon: Erroll Luna, MD;  Location: [facility];  Service: General;  Laterality: Left;  PEC BLOCK, RNFA   FEMUR IM NAIL Right 04/01/2021   Procedure: INTRAMEDULLARY (IM) NAIL FEMORAL;  Surgeon: [name], MD;  Location: [facility];  Service: Orthopedics;  Laterality: Right;   WISDOM TOOTH EXTRACTION      SOCIAL HISTORY: Social History   Socioeconomic History   Marital status:  Single    Spouse name: Not on file   Number of children: Not on file   Years of education: Not on file   Highest education level: Not on file  Occupational History   Not on file  Tobacco Use   Smoking status: Former    Types: Cigarettes    Quit date: 05/19/2020    Years since quitting: 2.3   Smokeless tobacco: Never  Vaping Use   Vaping Use: Never used  Substance and Sexual Activity   Alcohol use: Yes    Comment: social drinker   Drug use: Never   Sexual activity: Not Currently  Other Topics Concern   Not on file  Social History Narrative   Not on file   Social Determinants of Health   Financial Resource Strain: Not on file  Food Insecurity: Not on file  Transportation Needs: Not on file  Physical Activity: Not on file  Stress: Not on file  Social Connections: Not on file  Intimate Partner Violence: Not At Risk (05/06/2021)   Humiliation, Afraid, Rape, and Kick questionnaire    Fear of Current or Ex-Partner: No    Emotionally Abused: No    Physically Abused: No    Sexually Abused: No    FAMILY HISTORY: Family History  Problem  Relation Age of Onset   Colon cancer Neg Hx    Esophageal cancer Neg Hx    Stomach cancer Neg Hx    Rectal cancer Neg Hx     Review of Systems  Constitutional:  Positive for fatigue. Negative for appetite change, chills, fever and unexpected weight change.  HENT:   Negative for hearing loss, lump/mass and trouble swallowing.   Eyes:  Negative for eye problems and icterus.  Respiratory:  Negative for chest tightness, cough and shortness of breath.   Cardiovascular:  Negative for chest pain, leg swelling and palpitations.  Gastrointestinal:  Positive for constipation (Intermittent from her pain medication and this is managed well with prunes.). Negative for abdominal distention, abdominal pain, diarrhea, nausea and vomiting.  Endocrine: Negative for hot flashes.  Genitourinary:  Negative for difficulty urinating.   Musculoskeletal:   Positive for back pain. Negative for arthralgias.  Skin:  Negative for itching and rash.  Neurological:  Negative for dizziness, extremity weakness, headaches and numbness.  Hematological:  Negative for adenopathy. Does not bruise/bleed easily.  Psychiatric/Behavioral:  Negative for depression. The patient is not nervous/anxious.       PHYSICAL EXAMINATION  ECOG PERFORMANCE STATUS: 2 - Symptomatic, <50% confined to bed  Vitals:   09/17/22 1132  BP: 138/83  Pulse: 70  Resp: 18  SpO2: 98%    Physical Exam Constitutional:      General: She is not in acute distress.    Appearance: Normal appearance. She is not toxic-appearing.  HENT:     Head: Normocephalic and atraumatic.  Eyes:     General: No scleral icterus. Cardiovascular:     Rate and Rhythm: Normal rate and regular rhythm.     Pulses: Normal pulses.     Heart sounds: Normal heart sounds.  Pulmonary:     Effort: Pulmonary effort is normal.     Breath sounds: Normal breath sounds.  Abdominal:     General: Abdomen is flat. Bowel sounds are normal. There is no distension.     Palpations: Abdomen is soft.     Tenderness: There is no abdominal tenderness.  Musculoskeletal:        General: No swelling.     Cervical back: Neck supple.  Lymphadenopathy:     Cervical: No cervical adenopathy.  Skin:    General: Skin is warm and dry.     Findings: No rash.  Neurological:     General: No focal deficit present.     Mental Status: She is alert.  Psychiatric:        Mood and Affect: Mood normal.        Behavior: Behavior normal.     LABORATORY DATA:  CBC    Component Value Date/Time   WBC 6.7 09/17/2022 1201   WBC 15.1 (H) 04/02/2021 0303   RBC 3.68 (L) 09/17/2022 1201   HGB 10.9 (L) 09/17/2022 1201   HCT 33.5 (L) 09/17/2022 1201   PLT 279 09/17/2022 1201   MCV 91.0 09/17/2022 1201   MCH 29.6 09/17/2022 1201   MCHC 32.5 09/17/2022 1201   RDW 14.6 09/17/2022 1201   LYMPHSABS 1.2 09/17/2022 1201   MONOABS 0.6  09/17/2022 1201   EOSABS 0.3 09/17/2022 1201   BASOSABS 0.0 09/17/2022 1201    CMP     Component Value Date/Time   NA 141 09/17/2022 1201   K 4.2 09/17/2022 1201   CL 105 09/17/2022 1201   CO2 27 09/17/2022 1201   GLUCOSE 114 (H) 09/17/2022 1201  BUN 13 09/17/2022 1201   CREATININE 0.79 09/17/2022 1201   CALCIUM 9.9 09/17/2022 1201   PROT 7.4 09/17/2022 1201   ALBUMIN 4.0 09/17/2022 1201   AST 45 (H) 09/17/2022 1201   ALT 40 09/17/2022 1201   ALKPHOS 193 (H) 09/17/2022 1201   BILITOT 0.2 (L) 09/17/2022 1201   GFRNONAA >60 09/17/2022 1201   GFRAA >60 05/29/2020 0926      ASSESSMENT and THERAPY PLAN:   Malignant neoplasm of upper-inner quadrant of left breast in female, estrogen receptor positive ([place]) Natalie Griffin is a 46 year old woman with stage IV breast cancer on treatment with Herceptin.  She will proceed with Herceptin today.  She has not considered any additional or change in treatment as she is trying to get through the next 2 weeks.  She plans to undergo oophorectomy in January at which point she hopes to change to anastrozole and Kadcyla.  We discussed her pain medication and she is managing her pain relatively well.  She will continue this.  I let her know that we are happy to place a referral for her to see radiation again if she decides to do so for her pain.  We discussed signs and symptoms of cord compression and she knows when to call if she is having any worsening pain.  We will see Natalie Griffin back in early January for follow-up to discuss treatment changes at that time.   All questions were answered. The patient knows to call the clinic with any problems, questions or concerns. We can certainly see the patient much sooner if necessary.  Total encounter time:30 minutes*in face-to-face visit time, chart review, lab review, care coordination, order entry, and documentation of the encounter time.    Wilber Bihari, NP 09/17/22 4:07 PM Medical Oncology and  Hematology [facility] [place], [place] [address] Tel. [phone]    Fax. [phone]  *Total Encounter Time as defined by the Centers for Medicare and Medicaid Services includes, in addition to the face-to-face time of a patient visit (documented in the note above) non-face-to-face time: obtaining and reviewing outside history, ordering and reviewing medications, tests or procedures, care coordination (communications with other health care professionals or caregivers) and documentation in the medical record.

## 2022-09-21 ENCOUNTER — Encounter: Payer: Self-pay | Admitting: Hematology and Oncology

## 2022-09-22 ENCOUNTER — Telehealth: Payer: Self-pay

## 2022-09-22 NOTE — Patient Instructions (Signed)
DUE TO COVID-19 ONLY TWO VISITORS  (aged 48 and older)  ARE ALLOWED TO COME WITH YOU AND STAY IN THE WAITING ROOM ONLY DURING PRE OP AND PROCEDURE.   **NO VISITORS ARE ALLOWED IN THE SHORT STAY AREA OR RECOVERY ROOM!!**  IF YOU WILL BE ADMITTED INTO THE HOSPITAL YOU ARE ALLOWED ONLY FOUR SUPPORT PEOPLE DURING VISITATION HOURS ONLY (7 AM -8PM)   The support person(s) must pass our screening, gel in and out, and wear a mask at all times, including in the patient's room. Patients must also wear a mask when staff or their support person are in the room. Visitors GUEST BADGE MUST BE WORN VISIBLY  One adult visitor may remain with you overnight and MUST be in the room by 8 P.M.     Your procedure is scheduled on: 09/30/22   Report to [facility] Main Entrance    Report to admitting at : 10:45 AM   Call this number if you have problems the morning of surgery [phone]   Eat a light diet the day before surgery.  Examples including soups, broths, toast, yogurt, mashed potatoes.  Things to avoid include carbonated beverages (fizzy beverages), raw fruits and raw vegetables, or beans.   If your bowels are filled with gas, your surgeon will have difficulty visualizing your pelvic organs which increases your surgical risks. Do not eat food :After Midnight.   After Midnight you may have the following liquids until : 10:00 AM DAY OF SURGERY  Water Black Coffee (sugar ok, NO MILK/CREAM OR CREAMERS)  Tea (sugar ok, NO MILK/CREAM OR CREAMERS) regular and decaf                             Plain Jell-O (NO RED)                                           Fruit ices (not with fruit pulp, NO RED)                                     Popsicles (NO RED)                                                                  Juice: apple, WHITE grape, WHITE cranberry Sports drinks like Gatorade (NO RED)    Oral Hygiene is also important to reduce your risk of infection.                                     Remember - BRUSH YOUR TEETH THE MORNING OF SURGERY WITH YOUR REGULAR TOOTHPASTE  DENTURES WILL BE REMOVED PRIOR TO SURGERY PLEASE DO NOT APPLY "Poly grip" OR ADHESIVES!!!   Do NOT smoke after Midnight   Take these medicines the morning of surgery with A SIP OF WATER: tamoxifen.Alprazolam as needed.  You may not have any metal on your body including hair pins, jewelry, and body piercing             Do not wear make-up, lotions, powders, perfumes/cologne, or deodorant  Do not wear nail polish including gel and S&S, artificial/acrylic nails, or any other type of covering on natural nails including finger and toenails. If you have artificial nails, gel coating, etc. that needs to be removed by a nail salon please have this removed prior to surgery or surgery may need to be canceled/ delayed if the surgeon/ anesthesia feels like they are unable to be safely monitored.   Do not shave  48 hours prior to surgery.    Do not bring valuables to the hospital. [place].   Contacts, glasses, or bridgework may not be worn into surgery.   Bring small overnight bag day of surgery.   DO NOT [place]. PHARMACY WILL DISPENSE MEDICATIONS LISTED ON YOUR MEDICATION LIST TO YOU DURING YOUR ADMISSION [place]!    Patients discharged on the day of surgery will not be allowed to drive home.  Someone NEEDS to stay with you for the first 24 hours after anesthesia.   Special Instructions: Bring a copy of your healthcare power of attorney and living will documents         the day of surgery if you haven't scanned them before.              Please read over the following fact sheets you were given: IF YOU HAVE QUESTIONS ABOUT YOUR PRE-OP INSTRUCTIONS PLEASE CALL [phone]    [facility] - Preparing for Surgery Before surgery, you can play an important role.  Because skin is not sterile, your  skin needs to be as free of germs as possible.  You can reduce the number of germs on your skin by washing with CHG (chlorahexidine gluconate) soap before surgery.  CHG is an antiseptic cleaner which kills germs and bonds with the skin to continue killing germs even after washing. Please DO NOT use if you have an allergy to CHG or antibacterial soaps.  If your skin becomes reddened/irritated stop using the CHG and inform your nurse when you arrive at Short Stay. Do not shave (including legs and underarms) for at least 48 hours prior to the first CHG shower.  You may shave your face/neck. Please follow these instructions carefully:  1.  Shower with CHG Soap the night before surgery and the  morning of Surgery.  2.  If you choose to wash your hair, wash your hair first as usual with your  normal  shampoo.  3.  After you shampoo, rinse your hair and body thoroughly to remove the  shampoo.                           4.  Use CHG as you would any other liquid soap.  You can apply chg directly  to the skin and wash                       Gently with a scrungie or clean washcloth.  5.  Apply the CHG Soap to your body ONLY FROM THE NECK DOWN.   Do not use on face/ open  Wound or open sores. Avoid contact with eyes, ears mouth and genitals (private parts).                       Wash face,  Genitals (private parts) with your normal soap.             6.  Wash thoroughly, paying special attention to the area where your surgery  will be performed.  7.  Thoroughly rinse your body with warm water from the neck down.  8.  DO NOT shower/wash with your normal soap after using and rinsing off  the CHG Soap.                9.  Pat yourself dry with a clean towel.            10.  Wear clean pajamas.            11.  Place clean sheets on your bed the night of your first shower and do not  sleep with pets. Day of Surgery : Do not apply any lotions/deodorants the morning of surgery.  Please wear  clean clothes to the hospital/surgery center.  FAILURE TO FOLLOW THESE INSTRUCTIONS MAY RESULT IN THE CANCELLATION OF YOUR SURGERY PATIENT SIGNATURE_________________________________  NURSE SIGNATURE__________________________________  ________________________________________________________________________ WHAT IS A BLOOD TRANSFUSION? Blood Transfusion Information  A transfusion is the replacement of blood or some of its parts. Blood is made up of multiple cells which provide different functions. Red blood cells carry oxygen and are used for blood loss replacement. White blood cells fight against infection. Platelets control bleeding. Plasma helps clot blood. Other blood products are available for specialized needs, such as hemophilia or other clotting disorders. BEFORE THE TRANSFUSION  Who gives blood for transfusions?  Healthy volunteers who are fully evaluated to make sure their blood is safe. This is blood bank blood. Transfusion therapy is the safest it has ever been in the practice of medicine. Before blood is taken from a donor, a complete history is taken to make sure that person has no history of diseases nor engages in risky social behavior (examples are intravenous drug use or sexual activity with multiple partners). The donor's travel history is screened to minimize risk of transmitting infections, such as malaria. The donated blood is tested for signs of infectious diseases, such as HIV and hepatitis. The blood is then tested to be sure it is compatible with you in order to minimize the chance of a transfusion reaction. If you or a relative donates blood, this is often done in anticipation of surgery and is not appropriate for emergency situations. It takes many days to process the donated blood. RISKS AND COMPLICATIONS Although transfusion therapy is very safe and saves many lives, the main dangers of transfusion include:  Getting an infectious disease. Developing a transfusion  reaction. This is an allergic reaction to something in the blood you were given. Every precaution is taken to prevent this. The decision to have a blood transfusion has been considered carefully by your caregiver before blood is given. Blood is not given unless the benefits outweigh the risks. AFTER THE TRANSFUSION Right after receiving a blood transfusion, you will usually feel much better and more energetic. This is especially true if your red blood cells have gotten low (anemic). The transfusion raises the level of the red blood cells which carry oxygen, and this usually causes an energy increase. The nurse administering the transfusion will monitor you carefully for complications.  HOME CARE INSTRUCTIONS  No special instructions are needed after a transfusion. You may find your energy is better. Speak with your caregiver about any limitations on activity for underlying diseases you may have. SEEK MEDICAL CARE IF:  Your condition is not improving after your transfusion. You develop redness or irritation at the intravenous (IV) site. SEEK IMMEDIATE MEDICAL CARE IF:  Any of the following symptoms occur over the next 12 hours: Shaking chills. You have a temperature by mouth above 102 F (38.9 C), not controlled by medicine. Chest, back, or muscle pain. People around you feel you are not acting correctly or are confused. Shortness of breath or difficulty breathing. Dizziness and fainting. You get a rash or develop hives. You have a decrease in urine output. Your urine turns a dark color or changes to pink, red, or brown. Any of the following symptoms occur over the next 10 days: You have a temperature by mouth above 102 F (38.9 C), not controlled by medicine. Shortness of breath. Weakness after normal activity. The white part of the eye turns yellow (jaundice). You have a decrease in the amount of urine or are urinating less often. Your urine turns a dark color or changes to pink, red, or  brown. Document Released: 09/04/2000 Document Revised: 11/30/2011 Document Reviewed: 04/23/2008 [facility] Patient Information 2014 [place], [place].  _______________________________________________________________________

## 2022-09-22 NOTE — Telephone Encounter (Signed)
Pt is scheduled for oophorectomy 09/30/22. She is experiencing worsening pain to lower back and hips. She was offered appt with MD to assess this before undergoing surgery. She accepted appt for 09/24/22 at 1230.

## 2022-09-23 ENCOUNTER — Other Ambulatory Visit: Payer: Self-pay

## 2022-09-23 ENCOUNTER — Encounter (HOSPITAL_COMMUNITY): Payer: Self-pay

## 2022-09-23 ENCOUNTER — Encounter: Payer: Self-pay | Admitting: Hematology and Oncology

## 2022-09-23 ENCOUNTER — Encounter (HOSPITAL_COMMUNITY)
Admission: RE | Admit: 2022-09-23 | Discharge: 2022-09-23 | Disposition: A | Discharge status: Home / Self Care | Payer: 59 | Source: Ambulatory Visit | Attending: Gynecologic Oncology | Admitting: Gynecologic Oncology

## 2022-09-23 ENCOUNTER — Inpatient Hospital Stay: Payer: 59 | Admitting: Gynecologic Oncology

## 2022-09-23 DIAGNOSIS — C50212 Malignant neoplasm of upper-inner quadrant of left female breast: Secondary | ICD-10-CM | POA: Insufficient documentation

## 2022-09-23 DIAGNOSIS — Z170 Estrogen receptor positive status [ER+]: Secondary | ICD-10-CM | POA: Diagnosis not present

## 2022-09-23 DIAGNOSIS — C50919 Malignant neoplasm of unspecified site of unspecified female breast: Secondary | ICD-10-CM

## 2022-09-23 DIAGNOSIS — C7951 Secondary malignant neoplasm of bone: Secondary | ICD-10-CM | POA: Insufficient documentation

## 2022-09-23 DIAGNOSIS — Z01812 Encounter for preprocedural laboratory examination: Secondary | ICD-10-CM | POA: Diagnosis not present

## 2022-09-23 HISTORY — DX: Anxiety disorder, unspecified: F41.9

## 2022-09-23 LAB — COMPREHENSIVE METABOLIC PANEL
ALT: 52 U/L — ABNORMAL HIGH (ref 0–44)
AST: 59 U/L — ABNORMAL HIGH (ref 15–41)
Albumin: 3.9 g/dL (ref 3.5–5.0)
Alkaline Phosphatase: 160 U/L — ABNORMAL HIGH (ref 38–126)
Anion gap: 10 (ref 5–15)
BUN: 14 mg/dL (ref 6–20)
CO2: 24 mmol/L (ref 22–32)
Calcium: 9.7 mg/dL (ref 8.9–10.3)
Chloride: 105 mmol/L (ref 98–111)
Creatinine, Ser: 0.98 mg/dL (ref 0.44–1.00)
GFR, Estimated: >60 mL/min (ref >60)
Glucose, Bld: 113 mg/dL — ABNORMAL HIGH (ref 70–99)
Potassium: 4.8 mmol/L (ref 3.5–5.1)
Sodium: 139 mmol/L (ref 135–145)
Total Bilirubin: 0.2 mg/dL — ABNORMAL LOW (ref 0.3–1.2)
Total Protein: 7.6 g/dL (ref 6.5–8.1)

## 2022-09-23 NOTE — Patient Instructions (Signed)
Preparing for your Surgery   Plan for surgery on September 30, 2022 with Dr. Jeral Pinch at [place] will be scheduled for robotic assisted laparoscopic bilateral salpingo-oophorectomy (removal of both ovaries and fallopian tubes), and any other indicated procedures.    Dr. Lindi Adie is recommending stopping your tamoxifen one week before surgery and to resume it 2 weeks after as long as you are up moving around frequently. Fine to stop taking as of today.  Pre-operative Testing -You will receive a phone call from presurgical testing at [facility] to arrange for a pre-operative appointment and lab work.   -Bring your insurance card, copy of an advanced directive if applicable, medication list   -At that visit, you will be asked to sign a consent for a possible blood transfusion in case a transfusion becomes necessary during surgery.  The need for a blood transfusion is rare but having consent is a necessary part of your care.      -You should not be taking blood thinners or aspirin at least ten days prior to surgery unless instructed by your surgeon.   -Do not take supplements such as fish oil (omega 3), red yeast rice, turmeric before your surgery. You want to avoid medications with aspirin in them including headache powders such as BC or Goody's), Excedrin migraine.   Day Before Surgery at [place] will be asked to take in a light diet the day before surgery. You will be advised you can have clear liquids up until 3 hours before your surgery.     Eat a light diet the day before surgery.  Examples including soups, broths, toast, yogurt, mashed potatoes.  AVOID GAS PRODUCING FOODS. Things to avoid include carbonated beverages (fizzy beverages, sodas), raw fruits and raw vegetables (uncooked), or beans.    If your bowels are filled with gas, your surgeon will have difficulty visualizing your pelvic organs which increases your surgical risks.   Your role in  recovery Your role is to become active as soon as directed by your doctor, while still giving yourself time to heal.  Rest when you feel tired. You will be asked to do the following in order to speed your recovery:   - Cough and breathe deeply. This helps to clear and expand your lungs and can prevent pneumonia after surgery.  - [place]. Do mild physical activity. Walking or moving your legs help your circulation and body functions return to normal. Do not try to get up or walk alone the first time after surgery.   -If you develop swelling on one leg or the other, pain in the back of your leg, redness/warmth in one of your legs, please call the office or go to the Emergency Room to have a doppler to rule out a blood clot. For shortness of breath, chest pain-seek care in the Emergency Room as soon as possible. - Actively manage your pain. Managing your pain lets you move in comfort. We will ask you to rate your pain on a scale of zero to 10. It is your responsibility to tell your doctor or nurse where and how much you hurt so your pain can be treated.   Special Considerations -If you are diabetic, you may be placed on insulin after surgery to have closer control over your blood sugars to promote healing and recovery.  This does not mean that you will be discharged on insulin.  If applicable, your oral antidiabetics will be  resumed when you are tolerating a solid diet.   -Your final pathology results from surgery should be available around one week after surgery and the results will be relayed to you when available.   -FMLA forms can be faxed to [phone] and please allow 5-7 business days for completion.   Pain Management After Surgery -This will be addressed with Dr. Lindi Adie.    -Make sure that you have Tylenol and Ibuprofen IF YOU ARE ABLE TO TAKE THESE MEDICATIONS at home to use on a regular basis after surgery for pain control. We recommend alternating the medications  every hour to six hours since they work differently and are processed in the body differently for pain relief.   -Review the attached handout on narcotic use and their risks and side effects.    Bowel Regimen -You have been prescribed Sennakot-S to take nightly to prevent constipation especially if you are taking the narcotic pain medication intermittently.  It is important to prevent constipation and drink adequate amounts of liquids. You can stop taking this medication when you are not taking pain medication and you are back on your normal bowel routine.   Risks of Surgery Risks of surgery are low but include bleeding, infection, damage to surrounding structures, re-operation, blood clots, and very rarely death.     Blood Transfusion Information (For the consent to be signed before surgery)   We will be checking your blood type before surgery so in case of emergencies, we will know what type of blood you would need.                                             WHAT IS A BLOOD TRANSFUSION?   A transfusion is the replacement of blood or some of its parts. Blood is made up of multiple cells which provide different functions. Red blood cells carry oxygen and are used for blood loss replacement. White blood cells fight against infection. Platelets control bleeding. Plasma helps clot blood. Other blood products are available for specialized needs, such as hemophilia or other clotting disorders. BEFORE THE TRANSFUSION  Who gives blood for transfusions?  You may be able to donate blood to be used at a later date on yourself (autologous donation). Relatives can be asked to donate blood. This is generally not any safer than if you have received blood from a stranger. The same precautions are taken to ensure safety when a relative's blood is donated. Healthy volunteers who are fully evaluated to make sure their blood is safe. This is blood bank blood. Transfusion therapy is the safest it has ever  been in the practice of medicine. Before blood is taken from a donor, a complete history is taken to make sure that person has no history of diseases nor engages in risky social behavior (examples are intravenous drug use or sexual activity with multiple partners). The donor's travel history is screened to minimize risk of transmitting infections, such as malaria. The donated blood is tested for signs of infectious diseases, such as HIV and hepatitis. The blood is then tested to be sure it is compatible with you in order to minimize the chance of a transfusion reaction. If you or a relative donates blood, this is often done in anticipation of surgery and is not appropriate for emergency situations. It takes many days to process the donated blood. RISKS AND COMPLICATIONS  Although transfusion therapy is very safe and saves many lives, the main dangers of transfusion include:  Getting an infectious disease. Developing a transfusion reaction. This is an allergic reaction to something in the blood you were given. Every precaution is taken to prevent this. The decision to have a blood transfusion has been considered carefully by your caregiver before blood is given. Blood is not given unless the benefits outweigh the risks.   AFTER SURGERY INSTRUCTIONS   Return to work: 4-6 weeks if applicable   Activity: 1. Be up and out of the bed during the day.  Take a nap if needed.  You may walk up steps but be careful and use the hand rail.  Stair climbing will tire you more than you think, you may need to stop part way and rest.    2. No lifting or straining for 6 weeks over 10 pounds. No pushing, pulling, straining for 6 weeks.   3. No driving for around 1 week(s).  Do not drive if you are taking narcotic pain medicine and make sure that your reaction time has returned.    4. You can shower as soon as the next day after surgery. Shower daily.  Use your regular soap and water (not directly on the incision) and  pat your incision(s) dry afterwards; don't rub.  No tub baths or submerging your body in water until cleared by your surgeon. If you have the soap that was given to you by pre-surgical testing that was used before surgery, you do not need to use it afterwards because this can irritate your incisions.    5. No sexual activity and nothing in the vagina for 4 weeks.   6. You may experience a small amount of clear drainage from your incisions, which is normal.  If the drainage persists, increases, or changes color please call the office.   7. Do not use creams, lotions, or ointments such as neosporin on your incisions after surgery until advised by your surgeon because they can cause removal of the dermabond glue on your incisions.     8. You may experience vaginal spotting after surgery.  The spotting is normal but if you experience heavy bleeding, call our office.   9. Take Tylenol or ibuprofen first for pain if you are able to take these medications. Monitor your Tylenol intake to a max of 4,000 mg in a 24 hour period. You can alternate these medications after surgery.   Diet: 1. Low sodium Heart Healthy Diet is recommended but you are cleared to resume your normal (before surgery) diet after your procedure.   2. It is safe to use a laxative, such as Miralax or Colace, if you have difficulty moving your bowels. You have been prescribed Sennakot-S to take at bedtime every evening after surgery to keep bowel movements regular and to prevent constipation.     Wound Care: 1. Keep clean and dry.  Shower daily.   Reasons to call the Doctor: Fever - Oral temperature greater than 100.4 degrees Fahrenheit Foul-smelling vaginal discharge Difficulty urinating Nausea and vomiting Increased pain at the site of the incision that is unrelieved with pain medicine. Difficulty breathing with or without chest pain New calf pain especially if only on one side Sudden, continuing increased vaginal bleeding with  or without clots.   Contacts: For questions or concerns you should contact:   Dr. Jeral Pinch at [phone]   Joylene John, NP at [phone]   After Hours: call [phone] and  have the GYN Oncologist paged/contacted (after 5 pm or on the weekends).   Messages sent via mychart are for non-urgent matters and are not responded to after hours so for urgent needs, please call the after hours number.

## 2022-09-23 NOTE — Patient Instructions (Signed)
Preparing for your Surgery  Plan for surgery on September 30, 2022 with Dr. Jeral Pinch at [place] will be scheduled for robotic assisted laparoscopic bilateral salpingo-oophorectomy (removal of both ovaries and fallopian tubes), and any other indicated procedures.   Pre-operative Testing -You will receive a phone call from presurgical testing at [facility] to arrange for a pre-operative appointment and lab work.  -Bring your insurance card, copy of an advanced directive if applicable, medication list  -At that visit, you will be asked to sign a consent for a possible blood transfusion in case a transfusion becomes necessary during surgery.  The need for a blood transfusion is rare but having consent is a necessary part of your care.     -You should not be taking blood thinners or aspirin at least ten days prior to surgery unless instructed by your surgeon.  -Do not take supplements such as fish oil (omega 3), red yeast rice, turmeric before your surgery. You want to avoid medications with aspirin in them including headache powders such as BC or Goody's), Excedrin migraine.  Day Before Surgery at [place] will be asked to take in a light diet the day before surgery. You will be advised you can have clear liquids up until 3 hours before your surgery.    Eat a light diet the day before surgery.  Examples including soups, broths, toast, yogurt, mashed potatoes.  AVOID GAS PRODUCING FOODS. Things to avoid include carbonated beverages (fizzy beverages, sodas), raw fruits and raw vegetables (uncooked), or beans.   If your bowels are filled with gas, your surgeon will have difficulty visualizing your pelvic organs which increases your surgical risks.  Your role in recovery Your role is to become active as soon as directed by your doctor, while still giving yourself time to heal.  Rest when you feel tired. You will be asked to do the following in order to speed your  recovery:  - Cough and breathe deeply. This helps to clear and expand your lungs and can prevent pneumonia after surgery.  - [place]. Do mild physical activity. Walking or moving your legs help your circulation and body functions return to normal. Do not try to get up or walk alone the first time after surgery.   -If you develop swelling on one leg or the other, pain in the back of your leg, redness/warmth in one of your legs, please call the office or go to the Emergency Room to have a doppler to rule out a blood clot. For shortness of breath, chest pain-seek care in the Emergency Room as soon as possible. - Actively manage your pain. Managing your pain lets you move in comfort. We will ask you to rate your pain on a scale of zero to 10. It is your responsibility to tell your doctor or nurse where and how much you hurt so your pain can be treated.  Special Considerations -If you are diabetic, you may be placed on insulin after surgery to have closer control over your blood sugars to promote healing and recovery.  This does not mean that you will be discharged on insulin.  If applicable, your oral antidiabetics will be resumed when you are tolerating a solid diet.  -Your final pathology results from surgery should be available around one week after surgery and the results will be relayed to you when available.  -FMLA forms can be faxed to [phone] and please allow 5-7 business days  for completion.  Pain Management After Surgery -You can continue with your chronic pain regimen.   -Make sure that you have Tylenol and Ibuprofen IF YOU ARE ABLE TO TAKE THESE MEDICATIONS at home to use on a regular basis after surgery for pain control. We recommend alternating the medications every hour to six hours since they work differently and are processed in the body differently for pain relief.  -Review the attached handout on narcotic use and their risks and side effects.   Bowel  Regimen -You have been prescribed Sennakot-S to take nightly to prevent constipation especially if you are taking the narcotic pain medication intermittently.  It is important to prevent constipation and drink adequate amounts of liquids. You can stop taking this medication when you are not taking pain medication and you are back on your normal bowel routine.  Risks of Surgery Risks of surgery are low but include bleeding, infection, damage to surrounding structures, re-operation, blood clots, and very rarely death.   Blood Transfusion Information (For the consent to be signed before surgery)  We will be checking your blood type before surgery so in case of emergencies, we will know what type of blood you would need.                                            WHAT IS A BLOOD TRANSFUSION?  A transfusion is the replacement of blood or some of its parts. Blood is made up of multiple cells which provide different functions. Red blood cells carry oxygen and are used for blood loss replacement. White blood cells fight against infection. Platelets control bleeding. Plasma helps clot blood. Other blood products are available for specialized needs, such as hemophilia or other clotting disorders. BEFORE THE TRANSFUSION  Who gives blood for transfusions?  You may be able to donate blood to be used at a later date on yourself (autologous donation). Relatives can be asked to donate blood. This is generally not any safer than if you have received blood from a stranger. The same precautions are taken to ensure safety when a relative's blood is donated. Healthy volunteers who are fully evaluated to make sure their blood is safe. This is blood bank blood. Transfusion therapy is the safest it has ever been in the practice of medicine. Before blood is taken from a donor, a complete history is taken to make sure that person has no history of diseases nor engages in risky social behavior (examples are intravenous  drug use or sexual activity with multiple partners). The donor's travel history is screened to minimize risk of transmitting infections, such as malaria. The donated blood is tested for signs of infectious diseases, such as HIV and hepatitis. The blood is then tested to be sure it is compatible with you in order to minimize the chance of a transfusion reaction. If you or a relative donates blood, this is often done in anticipation of surgery and is not appropriate for emergency situations. It takes many days to process the donated blood. RISKS AND COMPLICATIONS Although transfusion therapy is very safe and saves many lives, the main dangers of transfusion include:  Getting an infectious disease. Developing a transfusion reaction. This is an allergic reaction to something in the blood you were given. Every precaution is taken to prevent this. The decision to have a blood transfusion has been considered carefully by your  caregiver before blood is given. Blood is not given unless the benefits outweigh the risks.  AFTER SURGERY INSTRUCTIONS  Return to work: 4-6 weeks if applicable  Activity: 1. Be up and out of the bed during the day.  Take a nap if needed.  You may walk up steps but be careful and use the hand rail.  Stair climbing will tire you more than you think, you may need to stop part way and rest.   2. No lifting or straining for 6 weeks over 10 pounds. No pushing, pulling, straining for 6 weeks.  3. No driving for around 1 week(s).  Do not drive if you are taking narcotic pain medicine and make sure that your reaction time has returned.   4. You can shower as soon as the next day after surgery. Shower daily.  Use your regular soap and water (not directly on the incision) and pat your incision(s) dry afterwards; don't rub.  No tub baths or submerging your body in water until cleared by your surgeon. If you have the soap that was given to you by pre-surgical testing that was used before  surgery, you do not need to use it afterwards because this can irritate your incisions.   5. No sexual activity and nothing in the vagina for 4 weeks.  6. You may experience a small amount of clear drainage from your incisions, which is normal.  If the drainage persists, increases, or changes color please call the office.  7. Do not use creams, lotions, or ointments such as neosporin on your incisions after surgery until advised by your surgeon because they can cause removal of the dermabond glue on your incisions.    8. You may experience vaginal spotting after surgery.  The spotting is normal but if you experience heavy bleeding, call our office.  9. Take Tylenol or ibuprofen first for pain if you are able to take these medications. Monitor your Tylenol intake to a max of 4,000 mg in a 24 hour period. You can alternate these medications after surgery.  Diet: 1. Low sodium Heart Healthy Diet is recommended but you are cleared to resume your normal (before surgery) diet after your procedure.  2. It is safe to use a laxative, such as Miralax or Colace, if you have difficulty moving your bowels. You have been prescribed Sennakot-S to take at bedtime every evening after surgery to keep bowel movements regular and to prevent constipation.    Wound Care: 1. Keep clean and dry.  Shower daily.  Reasons to call the Doctor: Fever - Oral temperature greater than 100.4 degrees Fahrenheit Foul-smelling vaginal discharge Difficulty urinating Nausea and vomiting Increased pain at the site of the incision that is unrelieved with pain medicine. Difficulty breathing with or without chest pain New calf pain especially if only on one side Sudden, continuing increased vaginal bleeding with or without clots.   Contacts: For questions or concerns you should contact:  Dr. Jeral Pinch at [phone]  Joylene John, NP at [phone]  After Hours: call [phone] and have the GYN Oncologist  paged/contacted (after 5 pm or on the weekends).  Messages sent via mychart are for non-urgent matters and are not responded to after hours so for urgent needs, please call the after hours number.

## 2022-09-23 NOTE — Progress Notes (Addendum)
For Short Stay: [place] appointment date:  Bowel Prep reminder:   For Anesthesia: PCP - DO: Sueanne Margarita. Cardiologist -   Chest x-ray - 05/17/22 EKG -  Stress Test -  ECHO - 06/08/22 Cardiac Cath -  Pacemaker/ICD device last checked: Pacemaker orders received: Device Rep notified:  Spinal Cord Stimulator:  Sleep Study -  CPAP -   Fasting Blood Sugar -  Checks Blood Sugar _____ times a day Date and result of last Hgb A1c-  Last dose of GLP1 agonist-  GLP1 instructions:   Last dose of SGLT-2 inhibitors-  SGLT-2 instructions:   Blood Thinner Instructions: Aspirin Instructions: Last Dose:  Activity level: Can go up a flight of stairs and activities of daily living without stopping and without chest pain and/or shortness of breath   Able to exercise without chest pain and/or shortness of breath   Anesthesia review:   Patient denies shortness of breath, fever, cough and chest pain at PAT appointment   Patient verbalized understanding of instructions that were given to them at the PAT appointment. Patient was also instructed that they will need to review over the PAT instructions again at home before surgery.

## 2022-09-24 ENCOUNTER — Other Ambulatory Visit: Payer: 59

## 2022-09-24 ENCOUNTER — Other Ambulatory Visit (HOSPITAL_COMMUNITY): Payer: Self-pay

## 2022-09-24 ENCOUNTER — Encounter: Payer: Self-pay | Admitting: Hematology and Oncology

## 2022-09-24 ENCOUNTER — Inpatient Hospital Stay (HOSPITAL_BASED_OUTPATIENT_CLINIC_OR_DEPARTMENT_OTHER): Payer: 59

## 2022-09-24 ENCOUNTER — Ambulatory Visit: Payer: 59

## 2022-09-24 ENCOUNTER — Inpatient Hospital Stay: Payer: 59 | Attending: Hematology and Oncology | Admitting: Hematology and Oncology

## 2022-09-24 VITALS — BP 112/66 | HR 82 | Temp 98.1°F | Wt 176.2 lb

## 2022-09-24 DIAGNOSIS — C50212 Malignant neoplasm of upper-inner quadrant of left female breast: Secondary | ICD-10-CM

## 2022-09-24 DIAGNOSIS — C50911 Malignant neoplasm of unspecified site of right female breast: Secondary | ICD-10-CM | POA: Insufficient documentation

## 2022-09-24 DIAGNOSIS — Z170 Estrogen receptor positive status [ER+]: Secondary | ICD-10-CM

## 2022-09-24 DIAGNOSIS — C7951 Secondary malignant neoplasm of bone: Secondary | ICD-10-CM | POA: Insufficient documentation

## 2022-09-24 LAB — CBC WITH DIFFERENTIAL/PLATELET
Abs Immature Granulocytes: 0.02 10*3/uL (ref 0.00–0.07)
Basophils Absolute: 0 10*3/uL (ref 0.0–0.1)
Basophils Relative: 1 %
Eosinophils Absolute: 0.3 10*3/uL (ref 0.0–0.5)
Eosinophils Relative: 6 %
HCT: 37.4 % (ref 36.0–46.0)
Hemoglobin: 11.4 g/dL — ABNORMAL LOW (ref 12.0–15.0)
Immature Granulocytes: 0 %
Lymphocytes Relative: 20 %
Lymphs Abs: 1.1 10*3/uL (ref 0.7–4.0)
MCH: 29.2 pg (ref 26.0–34.0)
MCHC: 30.5 g/dL (ref 30.0–36.0)
MCV: 95.9 fL (ref 80.0–100.0)
Monocytes Absolute: 0.6 10*3/uL (ref 0.1–1.0)
Monocytes Relative: 10 %
Neutro Abs: 3.5 10*3/uL (ref 1.7–7.7)
Neutrophils Relative %: 63 %
Platelets: 292 10*3/uL (ref 150–400)
RBC: 3.9 MIL/uL (ref 3.87–5.11)
RDW: 14.6 % (ref 11.5–15.5)
WBC: 5.5 10*3/uL (ref 4.0–10.5)
nRBC: 0 % (ref 0.0–0.2)

## 2022-09-24 MED ORDER — ANASTROZOLE 1 MG PO TABS
1.0000 mg | ORAL_TABLET | Freq: Every day | ORAL | 3 refills | Status: DC
  Filled 2022-09-24: qty 90, 90d supply, fill #0
  Filled 2022-12-17: qty 90, 90d supply, fill #1
  Filled 2023-01-05 – 2023-03-22 (×2): qty 90, 90d supply, fill #2
  Filled 2023-06-21: qty 90, 90d supply, fill #3

## 2022-09-24 NOTE — Progress Notes (Incomplete)
Patient Care Team: Sueanne Margarita, DO as PCP - General (Internal Medicine) Rockwell Germany, RN as Oncology Nurse Navigator Mauro Kaufmann, RN as Oncology Nurse Navigator  DIAGNOSIS: No diagnosis found.  SUMMARY OF ONCOLOGIC HISTORY: Oncology History  Malignant neoplasm of upper-inner quadrant of left breast in female, estrogen receptor positive ([place])  04/11/2020 Initial Diagnosis   Patient palpated a left breast lump. Mammogram on 03/08/20 showed a 3.0cm mass at the 11 o'clock position with no axillary adenopathy. 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   05/09/2020 Cancer Staging   Staging form: Breast, AJCC 8th Edition - Clinical stage from 05/09/2020: Stage IB (cT2, cN0, cM0, G2, ER+, PR+, HER2+) - Signed by Nicholas Lose, MD on 05/09/2020   06/04/2020 Surgery   Left lumpectomy ([name]): invasive and in situ ductal carcinoma, 3.2cm, clear margins, one left axillary lymph node negative for carcinoma.    02/2021 Relapse/Recurrence   Patient went to orthopedics for right hip pain.  Imaging revealed bone metastasis.  CT CAP: Cortical destruction of the right femoral neck consistent with skeletal metastases at risk for pathologic fracture.  Lucent lesion in the left iliac bone and L3 vertebral body consistent with multifocal skeletal metastases.   04/01/2021 Surgery   04/01/2021: Femoral intramedullary nail  Pathology review: Metastatic breast cancer ER 2% PR 0% HER2 3+ positive   04/08/2021 -  Anti-estrogen oral therapy   Tamoxifen 20 mg was prescribed on 04/08/2021 (discontinued by patient because she felt that the risks and benefits did not support it )--resumed in 07/2022 based on pathology results from right breast biopsy   05/14/2021 -  Chemotherapy   Subcutaneous Herceptin Perjeta (Phesgo) injection started 05/14/2021, switched to Herceptin Hylecta 06/11/2021 (Fixed drug eruption, profound diarrhea to  Phesgo)     05/20/2021 - 06/03/2021 Radiation Therapy   Palliative radiation to the iliac bone and hip   02/04/2022 Imaging   MRI thoracic spine 02/04/2022: Numerous bone metastases thoracic spine cervical and lumbar spines largest T5, T8, T11.  Chronic compression fractures T4, T5, T10 and T12    05/07/2022 Imaging   CT chest abdomen pelvis: No new or progressive bone metastases.  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   07/29/2022 PET scan   IMPRESSION: 1. Hypermetabolic mass in the posterior deep RIGHT breast consistent primary breast carcinoma. 2. Central nodal mediastinal metastasis to the high LEFT prevascular space and LEFT super clavicular node. 3. 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  [name] recommended changing treatment to [place] wanted to wait until after the holidays.  She will discuss further with Dr. Lindi Adie in January.     CHIEF COMPLIANT:   INTERVAL HISTORY: Natalie Griffin is a 47 year old with above-mentioned history of left breast cancer. Currently on tamoxifen. She presents to the clinic for a follow-up     ALLERGIES:  is allergic to adhesive [tape], fentanyl, phesgo [pertuz-trastuz-hyaluron-zzxf], tetanus toxoid, succinylcholine, and contrast media [iodinated contrast media].  MEDICATIONS:  Current Outpatient Medications  Medication Sig Dispense Refill   ALPRAZolam (XANAX) 0.5 MG tablet Take 1 tablet (0.5 mg total) by mouth 3 (three) times daily as needed for anxiety. 60 tablet 3  calcium carbonate (OS-CAL) 600 MG TABS tablet Take 600 mg by mouth in the morning and at bedtime.     ferrous sulfate 324 MG TBEC Take 324 mg by mouth daily in the afternoon.     morphine (MS  CONTIN) 30 MG 12 hr tablet Take 1 tablet (30 mg total) by mouth every 12 (twelve) hours. 60 tablet 0   Multiple Vitamins-Minerals (MULTIVITAMIN WITH MINERALS) tablet Take 1 tablet by mouth in the morning.     oxyCODONE-acetaminophen (PERCOCET) 10-325 MG tablet Take 1 tablet by mouth every 8 (eight) hours as needed for pain. 90 tablet 0   tamoxifen (NOLVADEX) 20 MG tablet Take 1 tablet (20 mg total) by mouth daily. 30 tablet 0   Vitamin D, Cholecalciferol, 25 MCG (1000 UT) TABS Take 1 tablet by mouth daily. 60 tablet    No current facility-administered medications for this visit.    PHYSICAL EXAMINATION: ECOG PERFORMANCE STATUS: {CHL ONC ECOG [phone]}  There were no vitals filed for this visit. There were no vitals filed for this visit.  BREAST:*** No palpable masses or nodules in either right or left breasts. No palpable axillary supraclavicular or infraclavicular adenopathy no breast tenderness or nipple discharge. (exam performed in the presence of a chaperone)  LABORATORY DATA:  I have reviewed the data as listed    Latest Ref Rng & Units 09/23/2022    2:17 PM 09/17/2022   12:01 PM 08/06/2022    1:13 PM  CMP  Glucose 70 - 99 mg/dL 113  114  148   BUN 6 - 20 mg/dL _0 Creatinine 0.44 - 1.00 mg/dL 0.98  0.79  0.83   Sodium 135 - 145 mmol/L 139  141  139   Potassium 3.5 - 5.1 mmol/L 4.8  4.2  4.2   Chloride 98 - 111 mmol/L 105  105  107   CO2 22 - 32 mmol/L _1 Calcium 8.9 - 10.3 mg/dL 9.7  9.9  10.0   Total Protein 6.5 - 8.1 g/dL 7.6  7.4  7.7   Total Bilirubin 0.3 - 1.2 mg/dL 0.2  0.2  0.2   Alkaline Phos 38 - 126 U/L 160  193  126   AST 15 - 41 U/L 59  45  27   ALT 0 - 44 U/L 52  40  23     Lab Results  Component Value Date   WBC 5.5 09/23/2022   HGB 11.4 (L) 09/23/2022   HCT 37.4 09/23/2022   MCV 95.9 09/23/2022   PLT 292 09/23/2022   NEUTROABS 3.5 09/23/2022    ASSESSMENT & PLAN:  No problem-specific Assessment & Plan notes found for this  encounter.    No orders of the defined types were placed in this encounter.  The patient has a good understanding of the overall plan. she agrees with it. she will call with any problems that may develop before the next visit here. Total time spent: 30 mins including face to face time and time spent for planning, charting and co-ordination of care   Suzzette Righter, [place] 09/24/22    I Gardiner Coins am acting as a Education administrator for [employer]  ***

## 2022-09-24 NOTE — Progress Notes (Signed)
Patient Care Team: Sueanne Margarita, DO as PCP - General (Internal Medicine) Rockwell Germany, RN as Oncology Nurse Navigator Mauro Kaufmann, RN as Oncology Nurse Navigator  DIAGNOSIS:  Encounter Diagnosis  Name Primary?   Malignant neoplasm of upper-inner quadrant of left breast in female, estrogen receptor positive ([place]) Yes    SUMMARY OF ONCOLOGIC HISTORY: Oncology History  Malignant neoplasm of upper-inner quadrant of left breast in female, estrogen receptor positive ([place])  04/11/2020 Initial Diagnosis   Patient palpated a left breast lump. Mammogram on 03/08/20 showed a 3.0cm mass at the 11 o'clock position with no axillary adenopathy. 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   05/09/2020 Cancer Staging   Staging form: Breast, AJCC 8th Edition - Clinical stage from 05/09/2020: Stage IB (cT2, cN0, cM0, G2, ER+, PR+, HER2+) - Signed by Nicholas Lose, MD on 05/09/2020   06/04/2020 Surgery   Left lumpectomy ([name]): invasive and in situ ductal carcinoma, 3.2cm, clear margins, one left axillary lymph node negative for carcinoma.    02/2021 Relapse/Recurrence   Patient went to orthopedics for right hip pain.  Imaging revealed bone metastasis.  CT CAP: Cortical destruction of the right femoral neck consistent with skeletal metastases at risk for pathologic fracture.  Lucent lesion in the left iliac bone and L3 vertebral body consistent with multifocal skeletal metastases.   04/01/2021 Surgery   04/01/2021: Femoral intramedullary nail  Pathology review: Metastatic breast cancer ER 2% PR 0% HER2 3+ positive   04/08/2021 -  Anti-estrogen oral therapy   Tamoxifen 20 mg was prescribed on 04/08/2021 (discontinued by patient because she felt that the risks and benefits did not support it )--resumed in 07/2022 based on pathology results from right breast biopsy   05/14/2021 -  Chemotherapy   Subcutaneous Herceptin  Perjeta (Phesgo) injection started 05/14/2021, switched to Herceptin Hylecta 06/11/2021 (Fixed drug eruption, profound diarrhea to Phesgo)     05/20/2021 - 06/03/2021 Radiation Therapy   Palliative radiation to the iliac bone and hip   02/04/2022 Imaging   MRI thoracic spine 02/04/2022: Numerous bone metastases thoracic spine cervical and lumbar spines largest T5, T8, T11.  Chronic compression fractures T4, T5, T10 and T12    05/07/2022 Imaging   CT chest abdomen pelvis: No new or progressive bone metastases.  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   07/29/2022 PET scan   IMPRESSION: 1. Hypermetabolic mass in the posterior deep RIGHT breast consistent primary breast carcinoma. 2. Central nodal mediastinal metastasis to the high LEFT prevascular space and LEFT super clavicular node. 3. 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  [name] recommended changing treatment to [place] wanted to wait until after the holidays.  She will discuss further with Dr. Lindi Adie in January.     CHIEF COMPLIANT:  Follow up extreme pain in hips  INTERVAL HISTORY: Natalie Griffin is a 47 year old with above-mentioned history of left breast cancer. Currently on tamoxifen and Herceptin. She presents to the clinic for a follow-up.  Last week she had intractable pain in the pelvis and was nearly 1 to come to the emergency room but she took her Percocets which appeared to give her pain relief  and she is able to manage it.  She has not been taking her pain medications correctly.  She had been taking MS Contin on an as-needed basis.  She does have constipation which she is managing with a variety of stool softeners and  laxatives.   ALLERGIES:  is allergic to adhesive [tape], fentanyl, phesgo [pertuz-trastuz-hyaluron-zzxf], tetanus toxoid, succinylcholine, and contrast media [iodinated contrast media].  MEDICATIONS:  Current Outpatient Medications  Medication Sig Dispense Refill   anastrozole (ARIMIDEX) 1 MG tablet Take 1 tablet (1 mg total) by mouth daily. 90 tablet 3   ALPRAZolam (XANAX) 0.5 MG tablet Take 1 tablet (0.5 mg total) by mouth 3 (three) times daily as needed for anxiety. 60 tablet 3   calcium carbonate (OS-CAL) 600 MG TABS tablet Take 600 mg by mouth in the morning and at bedtime.     ferrous sulfate 324 MG TBEC Take 324 mg by mouth daily in the afternoon.     morphine (MS CONTIN) 30 MG 12 hr tablet Take 1 tablet (30 mg total) by mouth every 12 (twelve) hours. 60 tablet 0   Multiple Vitamins-Minerals (MULTIVITAMIN WITH MINERALS) tablet Take 1 tablet by mouth in the morning.     oxyCODONE-acetaminophen (PERCOCET) 10-325 MG tablet Take 1 tablet by mouth every 8 (eight) hours as needed for pain. 90 tablet 0   tamoxifen (NOLVADEX) 20 MG tablet Take 1 tablet (20 mg total) by mouth daily. 30 tablet 0   Vitamin D, Cholecalciferol, 25 MCG (1000 UT) TABS Take 1 tablet by mouth daily. 60 tablet    No current facility-administered medications for this visit.    PHYSICAL EXAMINATION: ECOG PERFORMANCE STATUS: 1 - Symptomatic but completely ambulatory  Vitals:   09/24/22 1229  BP: 112/66  Pulse: 82  Temp: 98.1 F (36.7 C)  SpO2: 98%   Filed Weights   09/24/22 1229  Weight: 176 lb 3.2 oz (79.9 kg)      LABORATORY DATA:  I have reviewed the data as listed    Latest Ref Rng & Units 09/23/2022    2:17 PM 09/17/2022   12:01 PM 08/06/2022    1:13 PM  CMP  Glucose 70 - 99 mg/dL 113  114  148   BUN 6 - 20 mg/dL _0 Creatinine 0.44 - 1.00 mg/dL 0.98  0.79  0.83   Sodium 135 - 145 mmol/L 139  141  139   Potassium 3.5 - 5.1 mmol/L 4.8  4.2  4.2   Chloride 98 - 111 mmol/L 105  105  107    CO2 22 - 32 mmol/L _1 Calcium 8.9 - 10.3 mg/dL 9.7  9.9  10.0   Total Protein 6.5 - 8.1 g/dL 7.6  7.4  7.7   Total Bilirubin 0.3 - 1.2 mg/dL 0.2  0.2  0.2   Alkaline Phos 38 - 126 U/L 160  193  126   AST 15 - 41 U/L 59  45  27   ALT 0 - 44 U/L 52  40  23     Lab Results  Component Value Date   WBC 5.5 09/23/2022   HGB 11.4 (L) 09/23/2022   HCT 37.4 09/23/2022   MCV 95.9 09/23/2022   PLT 292 09/23/2022   NEUTROABS 3.5 09/23/2022    ASSESSMENT & PLAN:  Malignant neoplasm of upper-inner quadrant of left breast in female, estrogen receptor positive ([place]) 04/11/2020:Patient palpated a left breast lump. Mammogram on 03/08/20 showed  a 3.0cm mass at the 11 o'clock position with no axillary adenopathy. 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  Patient refused adjuvant chemo, adjuvant radiation and adjuvant antiestrogen therapy.   Patient went to orthopedics for right hip pain.  Imaging revealed bone metastasis.  CT CAP: Cortical destruction of the right femoral neck consistent with skeletal metastases at risk for pathologic fracture.  Lucent lesion in the left iliac bone and L3 vertebral body consistent with multifocal skeletal metastases. --------------------------------------------------------------- 04/01/2021: Femoral intramedullary nail  Pathology review: Metastatic breast cancer ER 2% PR 0% HER2 3+ positive   Treatment plan: Tamoxifen 20 mg was prescribed on 04/08/2021 (discontinued by patient because she felt that the risks and benefits did not support it), subcutaneous Herceptin Perjeta (Phesgo) injection started 05/14/2021, switched to Herceptin Hylecta 06/11/2021 (Fixed drug eruption, profound diarrhea to Phesgo)   Palliative radiation to the iliac  bone and hip: 05/20/2021-06/03/2021     Mid back pain: MRI thoracic spine 02/04/2022: Numerous bone metastases thoracic spine cervical and lumbar spines largest T5, T8, T11.  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   CT chest abdomen pelvis 05/07/2022: No new or progressive bone metastases.  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%, HER2 3+ positive, Ki-67 35% Based on the biopsy results, we will continue with anti-HER2 therapy along with antiestrogen therapy.   Treatment plan change: Oophorectomy: We will consult GYN oncology.  She wants to do the surgery in January Switch Tam to Anastrozole after the oophorectomy I recommended switching anti-HER2 therapy to Kadcyla.  She is willing to consider doing Kadcyla in January.  We might do it for the first cycle using a peripheral IV and then see how she does before we place a port.   Pain control: MS Contin and Percocets that she takes as needed.  I discussed with her extensively about how to take her pain medication.  She will take MS Contin twice a day irrespective of the pain.  She will take Percocet on top of it as needed for sharp increases in pain.  If the pain becomes uncontrolled then we can refer her to radiation.   Profound fatigue and weakness: Probably from radiation as well as the diffuse metastasis.   Oophorectomy has been planned for next week. We will then consider switching her from tamoxifen to anastrozole therapy. We may also have to discuss whether or not we want to switch her from Herceptin to [place].  Patient is not keen on getting Kadcyla because it would require an IV.  She wants to stay on Herceptin only.  She is permanently disabled and is applying for lower cost housing.   Orders Placed This Encounter  Procedures   ECHOCARDIOGRAM COMPLETE     Standing Status:   Future    Standing Expiration Date:   09/25/2023    Order Specific Question:   Where should this test be performed    Answer:   [place]    Order Specific Question:   Perflutren DEFINITY (image enhancing agent) should be administered unless hypersensitivity or allergy exist    Answer:  Administer Perflutren    Order Specific Question:   Reason for exam-Echo    Answer:   Chemo  Z09   The patient has a good understanding of the overall plan. she agrees with it. she will call with any problems that may develop before the next visit here. Total time spent: 30 mins including face to face time and time spent for planning, charting and co-ordination of care   Harriette Ohara, MD 09/24/22    I Gardiner Coins am acting as a Education administrator for [employer]  I have reviewed the above documentation for accuracy and completeness, and I agree with the above.

## 2022-09-24 NOTE — Progress Notes (Signed)
Met with Natalie Griffin and provided pre-op education.  Advised her to stop taking Tamoxifen today and to start the anastrozole according to Dr. Geralyn Flash instructions after surgery.  Also gave her my contact information to call if she has any questions.  She verbalized understanding and agreement of all instructions.

## 2022-09-24 NOTE — Assessment & Plan Note (Signed)
04/11/2020:Patient palpated a left breast lump. Mammogram on 03/08/20 showed a 3.0cm mass at the 11 o'clock position with no axillary adenopathy. 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  Patient refused adjuvant chemo, adjuvant radiation and adjuvant antiestrogen therapy.   Patient went to orthopedics for right hip pain.  Imaging revealed bone metastasis.  CT CAP: Cortical destruction of the right femoral neck consistent with skeletal metastases at risk for pathologic fracture.  Lucent lesion in the left iliac bone and L3 vertebral body consistent with multifocal skeletal metastases. --------------------------------------------------------------- 04/01/2021: Femoral intramedullary nail  Pathology review: Metastatic breast cancer ER 2% PR 0% HER2 3+ positive   Treatment plan: Tamoxifen 20 mg was prescribed on 04/08/2021 (discontinued by patient because she felt that the risks and benefits did not support it), subcutaneous Herceptin Perjeta (Phesgo) injection started 05/14/2021, switched to Herceptin Hylecta 06/11/2021 (Fixed drug eruption, profound diarrhea to Phesgo)   Palliative radiation to the iliac bone and hip: 05/20/2021-06/03/2021     Mid back pain: MRI thoracic spine 02/04/2022: Numerous bone metastases thoracic spine cervical and lumbar spines largest T5, T8, T11.  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   CT chest abdomen pelvis 05/07/2022: No new or progressive bone metastases.  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%, HER2 3+ positive, Ki-67 35% Based on the biopsy results, we will continue  with anti-HER2 therapy along with antiestrogen therapy.   Treatment plan change: Oophorectomy: We will consult GYN oncology.  She wants to do the surgery in January Switch Tam to Anastrozole after the oophorectomy I recommended switching anti-HER2 therapy to Kadcyla.  She is willing to consider doing Kadcyla in January.  We might do it for the first cycle using a peripheral IV and then see how she does before we place a port.   Pain control: MS Contin and Percocets that she takes as needed.   Profound fatigue and weakness: Probably from radiation as well as the diffuse metastasis.   Return to clinic in January to discuss her treatment plan as to whether she will proceed with Kadcyla.

## 2022-09-25 ENCOUNTER — Other Ambulatory Visit (HOSPITAL_COMMUNITY): Payer: Self-pay

## 2022-09-25 ENCOUNTER — Encounter: Payer: Self-pay | Admitting: Hematology and Oncology

## 2022-09-25 ENCOUNTER — Other Ambulatory Visit: Payer: Self-pay

## 2022-09-25 ENCOUNTER — Telehealth: Payer: Self-pay | Admitting: Hematology and Oncology

## 2022-09-25 NOTE — Telephone Encounter (Signed)
Called patient to follow up on the appointments that did not get scheduled due to no orders being signed, and the wrap up not matching what the patient said she needed.  I let patient know that we can not schedule appointments with no orders signed. I went ahead and scheduled appointments to put the patient at ease, I did want her to feel like she did not matter. Patient is aware of the appointments that are scheduled.

## 2022-09-27 ENCOUNTER — Other Ambulatory Visit: Payer: Self-pay

## 2022-09-28 ENCOUNTER — Other Ambulatory Visit: Payer: Self-pay

## 2022-09-28 ENCOUNTER — Ambulatory Visit
Admission: RE | Admit: 2022-09-28 | Discharge: 2022-09-28 | Disposition: A | Discharge status: Home / Self Care | Payer: 59 | Source: Ambulatory Visit | Attending: Radiation Oncology | Admitting: Radiation Oncology

## 2022-09-28 ENCOUNTER — Other Ambulatory Visit (HOSPITAL_COMMUNITY): Payer: Self-pay

## 2022-09-28 ENCOUNTER — Encounter (HOSPITAL_COMMUNITY): Payer: Self-pay

## 2022-09-28 NOTE — Progress Notes (Signed)
  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 A detailed voicemail was left.  The patient is scheduled for ongoing care with Dr. Lindi Adie in medical oncology. The patient was encouraged to call if she develop concerns or questions regarding radiation.    Leandra Kern, LPN

## 2022-09-29 ENCOUNTER — Telehealth: Payer: Self-pay

## 2022-09-29 ENCOUNTER — Other Ambulatory Visit (HOSPITAL_COMMUNITY): Payer: Self-pay

## 2022-09-29 NOTE — Telephone Encounter (Signed)
LM for Natalie Griffin to call back to see if she has any questions regarding her pre-op instructions for surgery tomorrow.

## 2022-09-29 NOTE — Telephone Encounter (Signed)
Telephone call to check on pre-operative status.  Patient compliant with pre-operative instructions.  Reinforced nothing to eat after midnight. Clear liquids until 0930. Patient to arrive at 1030.  No questions or concerns voiced.  Instructed to call for any needs. Natalie Griffin stopped tamoxifen on 09-24-22 as directed by Joylene John, NP.  Natalie Griffin states that she will not be resuming the tamoxifen post operatively as Dr. Lindi Adie will be changing her medication.

## 2022-09-30 ENCOUNTER — Other Ambulatory Visit: Payer: Self-pay

## 2022-09-30 ENCOUNTER — Ambulatory Visit (HOSPITAL_BASED_OUTPATIENT_CLINIC_OR_DEPARTMENT_OTHER): Payer: 59 | Admitting: Anesthesiology

## 2022-09-30 ENCOUNTER — Ambulatory Visit (HOSPITAL_COMMUNITY): Payer: 59 | Admitting: Anesthesiology

## 2022-09-30 ENCOUNTER — Encounter (HOSPITAL_COMMUNITY)
Admission: RE | Disposition: A | Discharge status: Home / Self Care | Payer: Self-pay | Source: Ambulatory Visit | Attending: Gynecologic Oncology

## 2022-09-30 ENCOUNTER — Encounter: Payer: Self-pay | Admitting: Hematology and Oncology

## 2022-09-30 ENCOUNTER — Encounter (HOSPITAL_COMMUNITY): Payer: Self-pay | Admitting: Gynecologic Oncology

## 2022-09-30 ENCOUNTER — Ambulatory Visit (HOSPITAL_COMMUNITY)
Admission: RE | Admit: 2022-09-30 | Discharge: 2022-09-30 | Disposition: A | Discharge status: Home / Self Care | Payer: 59 | Source: Ambulatory Visit | Attending: Gynecologic Oncology | Admitting: Gynecologic Oncology

## 2022-09-30 DIAGNOSIS — C781 Secondary malignant neoplasm of mediastinum: Secondary | ICD-10-CM | POA: Insufficient documentation

## 2022-09-30 DIAGNOSIS — F419 Anxiety disorder, unspecified: Secondary | ICD-10-CM | POA: Diagnosis not present

## 2022-09-30 DIAGNOSIS — Z170 Estrogen receptor positive status [ER+]: Secondary | ICD-10-CM

## 2022-09-30 DIAGNOSIS — D630 Anemia in neoplastic disease: Secondary | ICD-10-CM | POA: Diagnosis not present

## 2022-09-30 DIAGNOSIS — C50212 Malignant neoplasm of upper-inner quadrant of left female breast: Secondary | ICD-10-CM

## 2022-09-30 DIAGNOSIS — N83292 Other ovarian cyst, left side: Secondary | ICD-10-CM | POA: Diagnosis not present

## 2022-09-30 DIAGNOSIS — Z4002 Encounter for prophylactic removal of ovary: Secondary | ICD-10-CM

## 2022-09-30 DIAGNOSIS — C50911 Malignant neoplasm of unspecified site of right female breast: Secondary | ICD-10-CM | POA: Insufficient documentation

## 2022-09-30 DIAGNOSIS — C50919 Malignant neoplasm of unspecified site of unspecified female breast: Secondary | ICD-10-CM | POA: Diagnosis not present

## 2022-09-30 DIAGNOSIS — C7951 Secondary malignant neoplasm of bone: Secondary | ICD-10-CM | POA: Insufficient documentation

## 2022-09-30 DIAGNOSIS — Z1509 Genetic susceptibility to other malignant neoplasm: Secondary | ICD-10-CM | POA: Insufficient documentation

## 2022-09-30 DIAGNOSIS — Z79810 Long term (current) use of selective estrogen receptor modulators (SERMs): Secondary | ICD-10-CM | POA: Insufficient documentation

## 2022-09-30 DIAGNOSIS — N83202 Unspecified ovarian cyst, left side: Secondary | ICD-10-CM | POA: Diagnosis not present

## 2022-09-30 HISTORY — PX: ROBOTIC ASSISTED BILATERAL SALPINGO OOPHERECTOMY: SHX6078

## 2022-09-30 LAB — TYPE AND SCREEN
ABO/RH(D): A POS
Antibody Screen: NEGATIVE

## 2022-09-30 LAB — POCT PREGNANCY, URINE: Preg Test, Ur: NEGATIVE

## 2022-09-30 SURGERY — SALPINGO-OOPHORECTOMY, BILATERAL, ROBOT-ASSISTED
Anesthesia: General | Laterality: Bilateral

## 2022-09-30 MED ORDER — PROPOFOL 10 MG/ML IV BOLUS
INTRAVENOUS | Status: DC | PRN
  Administered 2022-09-30: 150 mg via INTRAVENOUS

## 2022-09-30 MED ORDER — STERILE WATER FOR IRRIGATION IR SOLN
Status: DC | PRN
  Administered 2022-09-30: 1000 mL

## 2022-09-30 MED ORDER — DEXAMETHASONE SODIUM PHOSPHATE 4 MG/ML IJ SOLN
4.0000 mg | INTRAMUSCULAR | Status: AC
  Administered 2022-09-30: 4 mg via INTRAVENOUS

## 2022-09-30 MED ORDER — PROPOFOL 500 MG/50ML IV EMUL
INTRAVENOUS | Status: DC | PRN
  Administered 2022-09-30: 25 ug/kg/min via INTRAVENOUS

## 2022-09-30 MED ORDER — MEPERIDINE HCL 50 MG/ML IJ SOLN: 6.2500 mg | INTRAMUSCULAR | Status: DC | PRN

## 2022-09-30 MED ORDER — OXYCODONE HCL 5 MG/5ML PO SOLN: 5.0000 mg | Freq: Once | ORAL | Status: DC | PRN

## 2022-09-30 MED ORDER — KETOROLAC TROMETHAMINE 15 MG/ML IJ SOLN
INTRAMUSCULAR | Status: AC
  Filled 2022-09-30: qty 1

## 2022-09-30 MED ORDER — ONDANSETRON HCL 4 MG/2ML IJ SOLN
INTRAMUSCULAR | Status: AC
  Filled 2022-09-30: qty 2

## 2022-09-30 MED ORDER — FENTANYL CITRATE PF 50 MCG/ML IJ SOSY
PREFILLED_SYRINGE | INTRAMUSCULAR | Status: AC
  Administered 2022-09-30: 50 ug via INTRAVENOUS
  Filled 2022-09-30: qty 3

## 2022-09-30 MED ORDER — LIDOCAINE HCL (CARDIAC) PF 100 MG/5ML IV SOSY
PREFILLED_SYRINGE | INTRAVENOUS | Status: DC | PRN
  Administered 2022-09-30: 60 mg via INTRAVENOUS

## 2022-09-30 MED ORDER — HEPARIN SODIUM (PORCINE) 5000 UNIT/ML IJ SOLN
5000.0000 [IU] | INTRAMUSCULAR | Status: AC
  Administered 2022-09-30: 5000 [IU] via SUBCUTANEOUS
  Filled 2022-09-30: qty 1

## 2022-09-30 MED ORDER — ORAL CARE MOUTH RINSE: 15.0000 mL | Freq: Once | OROMUCOSAL | Status: AC

## 2022-09-30 MED ORDER — LACTATED RINGERS IR SOLN
Status: DC | PRN
  Administered 2022-09-30: 1000 mL

## 2022-09-30 MED ORDER — LIDOCAINE HCL (PF) 2 % IJ SOLN
INTRAMUSCULAR | Status: AC
  Filled 2022-09-30: qty 5

## 2022-09-30 MED ORDER — MIDAZOLAM HCL 2 MG/2ML IJ SOLN
2.0000 mg | Freq: Once | INTRAMUSCULAR | Status: AC
  Administered 2022-09-30: 2 mg via INTRAVENOUS
  Filled 2022-09-30: qty 2

## 2022-09-30 MED ORDER — MIDAZOLAM HCL 2 MG/2ML IJ SOLN
INTRAMUSCULAR | Status: AC
  Filled 2022-09-30: qty 2

## 2022-09-30 MED ORDER — PROPOFOL 500 MG/50ML IV EMUL
INTRAVENOUS | Status: AC
  Filled 2022-09-30: qty 50

## 2022-09-30 MED ORDER — BUPIVACAINE HCL 0.25 % IJ SOLN
INTRAMUSCULAR | Status: AC
  Filled 2022-09-30: qty 1

## 2022-09-30 MED ORDER — ONDANSETRON HCL 4 MG/2ML IJ SOLN: 4.0000 mg | Freq: Once | INTRAMUSCULAR | Status: DC | PRN

## 2022-09-30 MED ORDER — LACTATED RINGERS IV SOLN: INTRAVENOUS | Status: DC

## 2022-09-30 MED ORDER — ROCURONIUM BROMIDE 100 MG/10ML IV SOLN
INTRAVENOUS | Status: DC | PRN
  Administered 2022-09-30: 70 mg via INTRAVENOUS
  Administered 2022-09-30: 30 mg via INTRAVENOUS

## 2022-09-30 MED ORDER — ACETAMINOPHEN 160 MG/5ML PO SOLN: 325.0000 mg | ORAL | Status: DC | PRN

## 2022-09-30 MED ORDER — HYDROMORPHONE HCL 1 MG/ML IJ SOLN
INTRAMUSCULAR | Status: AC
  Filled 2022-09-30: qty 2

## 2022-09-30 MED ORDER — ONDANSETRON HCL 4 MG/2ML IJ SOLN
INTRAMUSCULAR | Status: DC | PRN
  Administered 2022-09-30: 4 mg via INTRAVENOUS

## 2022-09-30 MED ORDER — FENTANYL CITRATE (PF) 100 MCG/2ML IJ SOLN
INTRAMUSCULAR | Status: DC | PRN
  Administered 2022-09-30: 50 ug via INTRAVENOUS
  Administered 2022-09-30: 100 ug via INTRAVENOUS

## 2022-09-30 MED ORDER — PHENYLEPHRINE HCL-NACL 20-0.9 MG/250ML-% IV SOLN
INTRAVENOUS | Status: DC | PRN
  Administered 2022-09-30 (×2): 80 ug via INTRAVENOUS

## 2022-09-30 MED ORDER — DEXAMETHASONE SODIUM PHOSPHATE 10 MG/ML IJ SOLN
INTRAMUSCULAR | Status: AC
  Filled 2022-09-30: qty 1

## 2022-09-30 MED ORDER — SUGAMMADEX SODIUM 200 MG/2ML IV SOLN
INTRAVENOUS | Status: DC | PRN
  Administered 2022-09-30: 200 mg via INTRAVENOUS

## 2022-09-30 MED ORDER — FENTANYL CITRATE PF 50 MCG/ML IJ SOSY
25.0000 ug | PREFILLED_SYRINGE | INTRAMUSCULAR | Status: DC | PRN
  Administered 2022-09-30 (×2): 50 ug via INTRAVENOUS

## 2022-09-30 MED ORDER — BUPIVACAINE HCL 0.25 % IJ SOLN
INTRAMUSCULAR | Status: DC | PRN
  Administered 2022-09-30: 30 mL

## 2022-09-30 MED ORDER — ROCURONIUM BROMIDE 10 MG/ML (PF) SYRINGE
PREFILLED_SYRINGE | INTRAVENOUS | Status: AC
  Filled 2022-09-30: qty 10

## 2022-09-30 MED ORDER — PROPOFOL 10 MG/ML IV BOLUS
INTRAVENOUS | Status: AC
  Filled 2022-09-30: qty 20

## 2022-09-30 MED ORDER — ACETAMINOPHEN 500 MG PO TABS
1000.0000 mg | ORAL_TABLET | ORAL | Status: AC
  Administered 2022-09-30: 1000 mg via ORAL
  Filled 2022-09-30: qty 2

## 2022-09-30 MED ORDER — OXYCODONE HCL 5 MG PO TABS: 5.0000 mg | ORAL_TABLET | Freq: Once | ORAL | Status: DC | PRN

## 2022-09-30 MED ORDER — FENTANYL CITRATE (PF) 250 MCG/5ML IJ SOLN
INTRAMUSCULAR | Status: AC
  Filled 2022-09-30: qty 5

## 2022-09-30 MED ORDER — LIDOCAINE HCL (PF) 2 % IJ SOLN
INTRAMUSCULAR | Status: AC
  Filled 2022-09-30: qty 10

## 2022-09-30 MED ORDER — SCOPOLAMINE 1 MG/3DAYS TD PT72
1.0000 | MEDICATED_PATCH | TRANSDERMAL | Status: DC
  Administered 2022-09-30: 1.5 mg via TRANSDERMAL
  Filled 2022-09-30: qty 1

## 2022-09-30 MED ORDER — LIDOCAINE 2% (20 MG/ML) 5 ML SYRINGE
INTRAMUSCULAR | Status: DC | PRN
  Administered 2022-09-30: 1.5 mg/kg/h via INTRAVENOUS

## 2022-09-30 MED ORDER — GABAPENTIN 300 MG PO CAPS
300.0000 mg | ORAL_CAPSULE | ORAL | Status: AC
  Administered 2022-09-30: 300 mg via ORAL
  Filled 2022-09-30: qty 1

## 2022-09-30 MED ORDER — CHLORHEXIDINE GLUCONATE 0.12 % MT SOLN
15.0000 mL | Freq: Once | OROMUCOSAL | Status: AC
  Administered 2022-09-30: 15 mL via OROMUCOSAL

## 2022-09-30 MED ORDER — MIDAZOLAM HCL 5 MG/5ML IJ SOLN
INTRAMUSCULAR | Status: DC | PRN
  Administered 2022-09-30: 2 mg via INTRAVENOUS

## 2022-09-30 MED ORDER — KETOROLAC TROMETHAMINE 15 MG/ML IJ SOLN
15.0000 mg | INTRAMUSCULAR | Status: AC
  Administered 2022-09-30: 15 mg via INTRAVENOUS

## 2022-09-30 MED ORDER — ACETAMINOPHEN 325 MG PO TABS: 325.0000 mg | ORAL_TABLET | ORAL | Status: DC | PRN

## 2022-09-30 MED ORDER — HYDROMORPHONE HCL 1 MG/ML IJ SOLN
0.5000 mg | INTRAMUSCULAR | Status: AC
  Administered 2022-09-30 (×4): 0.5 mg via INTRAVENOUS

## 2022-09-30 SURGICAL SUPPLY — 75 items
APPLICATOR SURGIFLO ENDO (HEMOSTASIS) IMPLANT
BAG COUNTER SPONGE SURGICOUNT (BAG) IMPLANT
BAG LAPAROSCOPIC 12 15 PORT 16 (BASKET) IMPLANT
BAG RETRIEVAL 12/15 (BASKET)
BLADE SURG SZ10 CARB STEEL (BLADE) IMPLANT
COVER BACK TABLE 60X90IN (DRAPES) ×1 IMPLANT
COVER TIP SHEARS 8 DVNC (MISCELLANEOUS) ×1 IMPLANT
COVER TIP SHEARS 8MM DA VINCI (MISCELLANEOUS) ×1
DERMABOND ADVANCED .7 DNX12 (GAUZE/BANDAGES/DRESSINGS) ×1 IMPLANT
DRAPE ARM DVNC X/XI (DISPOSABLE) ×4 IMPLANT
DRAPE COLUMN DVNC XI (DISPOSABLE) ×1 IMPLANT
DRAPE DA VINCI XI ARM (DISPOSABLE) ×4
DRAPE DA VINCI XI COLUMN (DISPOSABLE) ×1
DRAPE SHEET LG 3/4 BI-LAMINATE (DRAPES) ×1 IMPLANT
DRAPE SURG IRRIG POUCH 19X23 (DRAPES) ×1 IMPLANT
DRSG OPSITE POSTOP 4X6 (GAUZE/BANDAGES/DRESSINGS) IMPLANT
DRSG OPSITE POSTOP 4X8 (GAUZE/BANDAGES/DRESSINGS) IMPLANT
ELECT PENCIL ROCKER SW 15FT (MISCELLANEOUS) IMPLANT
ELECT REM PT RETURN 15FT ADLT (MISCELLANEOUS) ×1 IMPLANT
GAUZE 4X4 16PLY ~~LOC~~+RFID DBL (SPONGE) ×2 IMPLANT
GLOVE BIO SURGEON STRL SZ 6 (GLOVE) ×4 IMPLANT
GLOVE BIO SURGEON STRL SZ 6.5 (GLOVE) ×1 IMPLANT
GOWN STRL REUS W/ TWL LRG LVL3 (GOWN DISPOSABLE) ×4 IMPLANT
GOWN STRL REUS W/TWL LRG LVL3 (GOWN DISPOSABLE) ×4
GRASPER SUT TROCAR 14GX15 (MISCELLANEOUS) IMPLANT
HOLDER FOLEY CATH W/STRAP (MISCELLANEOUS) IMPLANT
IRRIG SUCT STRYKERFLOW 2 WTIP (MISCELLANEOUS) ×1
IRRIGATION SUCT STRKRFLW 2 WTP (MISCELLANEOUS) ×1 IMPLANT
KIT PROCEDURE DA VINCI SI (MISCELLANEOUS)
KIT PROCEDURE DVNC SI (MISCELLANEOUS) IMPLANT
KIT TURNOVER KIT A (KITS) IMPLANT
LIGASURE IMPACT 36 18CM CVD LR (INSTRUMENTS) IMPLANT
MANIPULATOR ADVINCU DEL 3.0 PL (MISCELLANEOUS) IMPLANT
MANIPULATOR ADVINCU DEL 3.5 PL (MISCELLANEOUS) IMPLANT
MANIPULATOR UTERINE 4.5 ZUMI (MISCELLANEOUS) IMPLANT
NDL HYPO 21X1.5 SAFETY (NEEDLE) ×1 IMPLANT
NDL SPNL 18GX3.5 QUINCKE PK (NEEDLE) IMPLANT
NEEDLE HYPO 21X1.5 SAFETY (NEEDLE) ×1 IMPLANT
NEEDLE SPNL 18GX3.5 QUINCKE PK (NEEDLE) IMPLANT
OBTURATOR OPTICAL STANDARD 8MM (TROCAR) ×1
OBTURATOR OPTICAL STND 8 DVNC (TROCAR) ×1
OBTURATOR OPTICALSTD 8 DVNC (TROCAR) ×1 IMPLANT
PACK ROBOT GYN CUSTOM WL (TRAY / TRAY PROCEDURE) ×1 IMPLANT
PAD POSITIONING PINK XL (MISCELLANEOUS) ×1 IMPLANT
PORT ACCESS TROCAR AIRSEAL 12 (TROCAR) IMPLANT
SEAL CANN UNIV 5-8 DVNC XI (MISCELLANEOUS) ×3 IMPLANT
SEAL XI 5MM-8MM UNIVERSAL (MISCELLANEOUS) ×3
SET TRI-LUMEN FLTR TB AIRSEAL (TUBING) ×1 IMPLANT
SOL PREP POV-IOD 4OZ 10% (MISCELLANEOUS) ×2 IMPLANT
SPIKE FLUID TRANSFER (MISCELLANEOUS) ×1 IMPLANT
SPONGE T-LAP 18X18 ~~LOC~~+RFID (SPONGE) IMPLANT
SURGIFLO W/THROMBIN 8M KIT (HEMOSTASIS) IMPLANT
SUT MNCRL AB 4-0 PS2 18 (SUTURE) IMPLANT
SUT PDS AB 1 TP1 96 (SUTURE) IMPLANT
SUT V-LOC 180 0-0 GS22 (SUTURE) IMPLANT
SUT VIC AB 0 CT1 27 (SUTURE)
SUT VIC AB 0 CT1 27XBRD ANTBC (SUTURE) IMPLANT
SUT VIC AB 2-0 CT1 27 (SUTURE)
SUT VIC AB 2-0 CT1 TAPERPNT 27 (SUTURE) IMPLANT
SUT VIC AB 4-0 PS2 18 (SUTURE) ×2 IMPLANT
SUT VICRYL 0 27 CT2 27 ABS (SUTURE) ×1 IMPLANT
SUT VLOC 180 0 9IN  GS21 (SUTURE)
SUT VLOC 180 0 9IN GS21 (SUTURE) IMPLANT
SYR 10ML LL (SYRINGE) IMPLANT
SYS BAG RETRIEVAL 10MM (BASKET) ×1
SYS WOUND ALEXIS 18CM MED (MISCELLANEOUS)
SYSTEM BAG RETRIEVAL 10MM (BASKET) IMPLANT
SYSTEM WOUND ALEXIS 18CM MED (MISCELLANEOUS) IMPLANT
TOWEL OR NON WOVEN STRL DISP B (DISPOSABLE) IMPLANT
TRAP SPECIMEN MUCUS 40CC (MISCELLANEOUS) IMPLANT
TRAY FOLEY MTR SLVR 16FR STAT (SET/KITS/TRAYS/PACK) ×1 IMPLANT
TROCAR PORT AIRSEAL 5X120 (TROCAR) IMPLANT
UNDERPAD 30X36 HEAVY ABSORB (UNDERPADS AND DIAPERS) ×2 IMPLANT
WATER STERILE IRR 1000ML POUR (IV SOLUTION) ×1 IMPLANT
YANKAUER SUCT BULB TIP 10FT TU (MISCELLANEOUS) IMPLANT

## 2022-09-30 NOTE — Discharge Instructions (Addendum)
AFTER SURGERY INSTRUCTIONS   Return to work: 4-6 weeks if applicable  Dr. Lindi Adie is recommending resuming your tamoxifen 2 weeks after surgery as long as you are up moving around frequently.   You can continue on your pain regimen managed by Dr. Lindi Adie.   Activity: 1. Be up and out of the bed during the day.  Take a nap if needed.  You may walk up steps but be careful and use the hand rail.  Stair climbing will tire you more than you think, you may need to stop part way and rest.    2. No lifting or straining for 6 weeks over 10 pounds. No pushing, pulling, straining for 6 weeks.   3. No driving for around 1 week(s).  Do not drive if you are taking narcotic pain medicine and make sure that your reaction time has returned.    4. You can shower as soon as the next day after surgery. Shower daily.  Use your regular soap and water (not directly on the incision) and pat your incision(s) dry afterwards; don't rub.  No tub baths or submerging your body in water until cleared by your surgeon. If you have the soap that was given to you by pre-surgical testing that was used before surgery, you do not need to use it afterwards because this can irritate your incisions.    5. No sexual activity and nothing in the vagina for 4 weeks.   6. You may experience a small amount of clear drainage from your incisions, which is normal.  If the drainage persists, increases, or changes color please call the office.   7. Do not use creams, lotions, or ointments such as neosporin on your incisions after surgery until advised by your surgeon because they can cause removal of the dermabond glue on your incisions.     8. You may experience vaginal spotting after surgery.  The spotting is normal but if you experience heavy bleeding, call our office.   9. Take Tylenol or ibuprofen first for pain if you are able to take these medications. Monitor your Tylenol intake to a max of 4,000 mg in a 24 hour period. You can  alternate these medications after surgery.   Diet: 1. Low sodium Heart Healthy Diet is recommended but you are cleared to resume your normal (before surgery) diet after your procedure.   2. It is safe to use a laxative, such as Miralax or Colace, if you have difficulty moving your bowels. You have been prescribed Sennakot-S to take at bedtime every evening after surgery to keep bowel movements regular and to prevent constipation.     Wound Care: 1. Keep clean and dry.  Shower daily.   Reasons to call the Doctor: Fever - Oral temperature greater than 100.4 degrees Fahrenheit Foul-smelling vaginal discharge Difficulty urinating Nausea and vomiting Increased pain at the site of the incision that is unrelieved with pain medicine. Difficulty breathing with or without chest pain New calf pain especially if only on one side Sudden, continuing increased vaginal bleeding with or without clots.   Contacts: For questions or concerns you should contact:   Dr. Jeral Pinch at [phone]   Joylene John, NP at [phone]   After Hours: call [phone] and have the GYN Oncologist paged/contacted (after 5 pm or on the weekends).   Messages sent via mychart are for non-urgent matters and are not responded to after hours so for urgent needs, please call the after hours number.

## 2022-09-30 NOTE — H&P (Signed)
Gynecologic Oncology H&P  09/30/22  Treatment History: Oncology History  Malignant neoplasm of upper-inner quadrant of left breast in female, estrogen receptor positive ([place])  04/11/2020 Initial Diagnosis   Patient palpated a left breast lump. Mammogram on 03/08/20 showed a 3.0cm mass at the 11 o'clock position with no axillary adenopathy. 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   05/09/2020 Cancer Staging   Staging form: Breast, AJCC 8th Edition - Clinical stage from 05/09/2020: Stage IB (cT2, cN0, cM0, G2, ER+, PR+, HER2+) - Signed by Nicholas Lose, MD on 05/09/2020   06/04/2020 Surgery   Left lumpectomy ([name]): invasive and in situ ductal carcinoma, 3.2cm, clear margins, one left axillary lymph node negative for carcinoma.    02/2021 Relapse/Recurrence   Patient went to orthopedics for right hip pain.  Imaging revealed bone metastasis.  CT CAP: Cortical destruction of the right femoral neck consistent with skeletal metastases at risk for pathologic fracture.  Lucent lesion in the left iliac bone and L3 vertebral body consistent with multifocal skeletal metastases.   04/01/2021 Surgery   04/01/2021: Femoral intramedullary nail  Pathology review: Metastatic breast cancer ER 2% PR 0% HER2 3+ positive   04/08/2021 -  Anti-estrogen oral therapy   Tamoxifen 20 mg was prescribed on 04/08/2021 (discontinued by patient because she felt that the risks and benefits did not support it )--resumed in 07/2022 based on pathology results from right breast biopsy   05/14/2021 -  Chemotherapy   Subcutaneous Herceptin Perjeta (Phesgo) injection started 05/14/2021, switched to Herceptin Hylecta 06/11/2021 (Fixed drug eruption, profound diarrhea to Phesgo)     05/20/2021 - 06/03/2021 Radiation Therapy   Palliative radiation to the iliac bone and hip   02/04/2022 Imaging   MRI thoracic spine 02/04/2022: Numerous bone metastases thoracic  spine cervical and lumbar spines largest T5, T8, T11.  Chronic compression fractures T4, T5, T10 and T12    05/07/2022 Imaging   CT chest abdomen pelvis: No new or progressive bone metastases.  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   07/29/2022 PET scan   IMPRESSION: 1. Hypermetabolic mass in the posterior deep RIGHT breast consistent primary breast carcinoma. 2. Central nodal mediastinal metastasis to the high LEFT prevascular space and LEFT super clavicular node. 3. 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  [name] recommended changing treatment to [place] wanted to wait until after the holidays.  She will discuss further with Dr. Lindi Adie in January.    Her breast cancer diagnosed in 2021, treated with surgery. She decline adjuvant chemotherapy, radiation and antiestrogen therapy. She presented with right hip pain in 02/2021 and imaging revealed bony mets.  She was subsequently diagnosed with recurrent and metastatic estrogen receptor breast cancer.  She is currently on Herceptin and tamoxifen.   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    The patient reports several days of increased right rib pain.  Her weight has been somewhat up-and-down related to surgery and being on steroids.  She has increased appetite right now because of steroids.  She denies any nausea or emesis.  She has some slight constipation, uses prunes and prune juice with  good relief.  She denies any bladder symptoms.   She denies any pelvic pain.  Her menses were regular until she started tamoxifen.  They are now somewhat irregular.  Her last cycle was on 10/20.  She describes a  history of normal menses with average blood flow.   She is currently on disability.  She previously worked as an Tax adviser.  Interval History: Doing well. No new symptoms.  Past Medical/Surgical History: Past Medical History:  Diagnosis Date   Anemia    Anxiety    Bone metastasis    Breast cancer ([place]) 2021   Left breast invasive ductal carcinoma   Cancer ([place]) 2021   Left breast   Family history of adverse reaction to anesthesia    Father and Aunt have pseudocholinesterase deficiency - Trouble waking up from anesthesia   History of hiatal hernia    History of kidney stones    noted on CT Left nonobstructive    Past Surgical History:  Procedure Laterality Date   BREAST BIOPSY Right 07/21/2022   [place] RT BREAST BX W LOC DEV 1ST LESION IMG BX SPEC US GUIDE 07/21/2022 [facility]MO[facility]   BREAST LUMPECTOMY WITH RADIOACTIVE SEED AND SENTINEL LYMPH NODE BIOPSY Left 06/04/2020   Procedure: LEFT BREAST LUMPECTOMY WITH RADIOACTIVE SEED AND SENTINEL LYMPH NODE MAPPING;  Surgeon: Erroll Luna, MD;  Location: [facility];  Service: General;  Laterality: Left;  PEC BLOCK, RNFA   FEMUR IM NAIL Right 04/01/2021   Procedure: INTRAMEDULLARY (IM) NAIL FEMORAL;  Surgeon: [name], MD;  Location: [facility];  Service: Orthopedics;  Laterality: Right;   WISDOM TOOTH EXTRACTION      Family History  Problem Relation Age of Onset   Colon cancer Neg Hx    Esophageal cancer Neg Hx    Stomach cancer Neg Hx    Rectal cancer Neg Hx     Social History   Socioeconomic History   Marital status: Single    Spouse name: Not on file   Number of children: Not on file   Years of education: Not on file   Highest education level: Not on file  Occupational History   Not on file  Tobacco Use   Smoking status: Former    Types: Cigarettes    Quit date: 05/19/2020    Years since quitting: 2.3   Smokeless tobacco: Never  Vaping Use   Vaping Use: Never used  Substance and Sexual Activity   Alcohol use:  Not Currently   Drug use: Never   Sexual activity: Not Currently  Other Topics Concern   Not on file  Social History Narrative   Not on file   Social Determinants of Health   Financial Resource Strain: Not on file  Food Insecurity: Not on file  Transportation Needs: Not on file  Physical Activity: Not on file  Stress: Not on file  Social Connections: Not on file    Current Medications:  Current Facility-Administered Medications:    dexamethasone (DECADRON) injection 4 mg, 4 mg, Intravenous, On Call to OR, Cross, Melissa D, NP   ketorolac (TORADOL) 15 MG/ML injection 15 mg, 15 mg, Intravenous, On Call to OR, Cross, Carollee Massed, NP   lactated ringers infusion, , Intravenous, Continuous, Nilda Simmer, MD, Last Rate: 10 mL/hr at 09/30/22 1116, New Bag at 09/30/22 1116   scopolamine (TRANSDERM-SCOP) 1 MG/3DAYS 1.5 mg, 1 patch, Transdermal, On Call to OR, Cross, Melissa D, NP, 1.5 mg at 09/30/22 1142  Review of Systems: +  hearing loss, esophagitis from radiation treatment, right inferior rib pain Denies appetite changes, fevers, chills, fatigue, unexplained weight changes. Denies hearing loss, neck lumps or masses, mouth sores, ringing in ears or voice changes. Denies cough or wheezing.  Denies shortness of breath. Denies chest pain or palpitations. Denies leg swelling. Denies abdominal distention, pain, blood in stools, constipation, diarrhea, nausea, vomiting, or early satiety. Denies pain with intercourse, dysuria, frequency, hematuria or incontinence. Denies hot flashes, pelvic pain, vaginal bleeding or vaginal discharge.   Denies joint pain, back pain or muscle pain/cramps. Denies itching, rash, or wounds. Denies dizziness, headaches, numbness or seizures. Denies swollen lymph nodes or glands, denies easy bruising or bleeding. Denies anxiety, depression, confusion, or decreased concentration.  Physical Exam: BP (!) 141/77   Pulse 85   Temp 99 F (37.2 C) (Oral)    Resp 18   Ht '5\' 5"'$  (1.651 m)   Wt 174 lb 2.6 oz (79 kg)   LMP 07/10/2022 Comment: negative POCT 09/30/22  SpO2 96%   BMI 28.98 kg/m  General: Alert, oriented, no acute distress.  HEENT: Normocephalic, atraumatic. Sclera anicteric.  Chest: Clear to auscultation bilaterally. No wheezes, rhonchi, or rales. Cardiovascular: Regular rate and rhythm, no murmurs, rubs, or gallops.  Abdomen: Normoactive bowel sounds. Soft, nondistended, nontender to palpation although did not palpate in right upper quadrant secondary to her rib pain. No masses or hepatosplenomegaly appreciated. No palpable fluid wave.  Extremities: Grossly normal range of motion. Warm, well perfused. No edema bilaterally.  Skin: No rashes or lesions.   Laboratory & Radiologic Studies: None new  Assessment & Plan: Natalie Griffin is a 47 y.o. woman with metastatic ER+ breast cancer.     Plan for therapeutic BSO.   We reviewed the plan for a robotic assisted bilateral salpingo-oophorectomy, possible laparotomy. The risks of surgery were discussed in detail and she understands these to include infection; wound separation; hernia; vaginal cuff separation, injury to adjacent organs such as bowel, bladder, blood vessels, ureters and nerves; bleeding which may require blood transfusion; anesthesia risk; thromboembolic events; possible death; unforeseen complications; possible need for re-exploration; medical complications such as heart attack, stroke, pleural effusion and pneumonia. The patient will receive DVT and antibiotic prophylaxis as indicated. She voiced a clear understanding. She had the opportunity to ask questions.   Jeral Pinch, MD  Division of Gynecologic Oncology  Department of Obstetrics and Gynecology  [facility]

## 2022-09-30 NOTE — Anesthesia Preprocedure Evaluation (Addendum)
Anesthesia Evaluation  Patient identified by MRN, date of birth, ID band Patient awake  General Assessment Comment:Family history of adverse reaction to anesthesia  Father and Aunt have pseudocholinesterase deficiency - Trouble waking up from anesthesia  Reviewed: Allergy & Precautions, NPO status , Patient's Chart, lab work & pertinent test results  History of Anesthesia Complications (+) Family history of anesthesia reactionNegative for: history of anesthetic complications  Airway Mallampati: II  TM Distance: >3 FB Neck ROM: Full    Dental  (+) Dental Advisory Given, Teeth Intact   Pulmonary former smoker   breath sounds clear to auscultation       Cardiovascular negative cardio ROS  Rhythm:Regular  1. Left ventricular ejection fraction, by estimation, is 55 to 60%. The  left ventricle has normal function. The left ventricle has no regional  wall motion abnormalities. Left ventricular diastolic parameters are  consistent with Grade I diastolic  dysfunction (impaired relaxation). The average left ventricular global  longitudinal strain is -19.0 %. The global longitudinal strain is normal.   2. Right ventricular systolic function is normal. The right ventricular  size is normal.   3. The mitral valve is normal in structure. Trivial mitral valve  regurgitation. No evidence of mitral stenosis.   4. The aortic valve is tricuspid. Aortic valve regurgitation is not  visualized. No aortic stenosis is present.   5. The inferior vena cava is normal in size with greater than 50%  respiratory variability, suggesting right atrial pressure of 3 mmHg.     Neuro/Psych   Anxiety     negative neurological ROS  negative psych ROS   GI/Hepatic Neg liver ROS,,,  Endo/Other  negative endocrine ROS    Renal/GU negative Renal ROS     Musculoskeletal Impending right hip fracture   Abdominal   Peds  Hematology negative hematology ROS (+)  Blood dyscrasia, anemia   Anesthesia Other Findings   Reproductive/Obstetrics                             Anesthesia Physical Anesthesia Plan  ASA: 3  Anesthesia Plan: General   Post-op Pain Management: Minimal or no pain anticipated, Dilaudid IV and Precedex   Induction:   PONV Risk Score and Plan: 2 and Propofol infusion, Ondansetron and Dexamethasone  Airway Management Planned: Oral ETT and LMA  Additional Equipment: None  Intra-op Plan:   Post-operative Plan: Extubation in OR  Informed Consent: I have reviewed the patients History and Physical, chart, labs and discussed the procedure including the risks, benefits and alternatives for the proposed anesthesia with the patient or authorized representative who has indicated his/her understanding and acceptance.    Discussed DNR with patient, Discussed DNR with power of attorney and Continue DNR.   Dental advisory given  Plan Discussed with: CRNA and Anesthesiologist  Anesthesia Plan Comments: (Long discussion with patient regarding DNR status.  We will provide chemical resuscitation if needed.  NO chest compressions or electrical resuscitation. )        Anesthesia Quick Evaluation

## 2022-09-30 NOTE — Anesthesia Procedure Notes (Signed)
Procedure Name: Intubation Date/Time: 09/30/2022 2:32 PM  Performed by: Niel Hummer, CRNAPre-anesthesia Checklist: Patient identified, Emergency Drugs available, Suction available and Patient being monitored Patient Re-evaluated:Patient Re-evaluated prior to induction Oxygen Delivery Method: Circle system utilized Preoxygenation: Pre-oxygenation with 100% oxygen Induction Type: IV induction Ventilation: Mask ventilation without difficulty Laryngoscope Size: Mac and 3 Grade View: Grade I Tube type: Oral Tube size: 7.0 mm Number of attempts: 1 Airway Equipment and Method: Stylet Placement Confirmation: ETT inserted through vocal cords under direct vision, positive ETCO2 and breath sounds checked- equal and bilateral Secured at: 22 cm Tube secured with: Tape Dental Injury: Teeth and Oropharynx as per pre-operative assessment

## 2022-09-30 NOTE — Anesthesia Postprocedure Evaluation (Signed)
Anesthesia Post Note  Patient: Natalie Griffin  Procedure(s) Performed: XI ROBOTIC ASSISTED BILATERAL SALPINGO OOPHORECTOMY (Bilateral)     Patient location during evaluation: PACU Anesthesia Type: General Level of consciousness: awake and alert Pain management: pain level controlled Vital Signs Assessment: post-procedure vital signs reviewed and stable Respiratory status: spontaneous breathing, nonlabored ventilation, respiratory function stable and patient connected to nasal cannula oxygen Cardiovascular status: blood pressure returned to baseline and stable Postop Assessment: no apparent nausea or vomiting Anesthetic complications: no   No notable events documented.  Last Vitals:  Vitals:   09/30/22 1039 09/30/22 1600  BP: (!) 141/77 (!) 156/89  Pulse: 85 62  Resp: 18 19  Temp: 37.2 C 37 C  SpO2: 96% 100%    Last Pain:  Vitals:   09/30/22 1600  TempSrc:   PainSc: 8                  Amrit Cress

## 2022-09-30 NOTE — Progress Notes (Signed)
Per Dr. Berline Lopes, no ABX needed for case.

## 2022-09-30 NOTE — Op Note (Signed)
OPERATIVE NOTE  Pre-operative Diagnosis: ER+ breast cancer  Post-operative Diagnosis: same, left para-ovarian 2 cm cyst  Operation: Robotic-assisted laparoscopic bilateral salpingo-oophorectomy   Surgeon: Jeral Pinch MD  Assistant Surgeon: Lahoma Crocker MD (an MD assistant was necessary for tissue manipulation, management of robotic instrumentation, retraction and positioning due to the complexity of the case and hospital policies).   Anesthesia: GET  Urine Output: 25 cc  Operative Findings:  On EUA, 8 cm mobile uterus, no adnexal masses. On intra-abdominal entry, normal upper abdominal survey. Normal omentum, small and large bowel. Normal right adnexa. Left ovary with 2 cm para-ovarian cyst, otherwise normal in appearance. Uterus 8 cm and normal in appearance. No ascites.   Estimated Blood Loss:  25 cc      Total IV Fluids: see I&O flowsheet         Specimens: bilateral tubes and ovaries         Complications:  None apparent; patient tolerated the procedure well.         Disposition: PACU - hemodynamically stable.  Procedure Details  The patient was seen in the Holding Room. The risks, benefits, complications, treatment options, and expected outcomes were discussed with the patient.  The patient concurred with the proposed plan, giving informed consent.  The site of surgery properly noted/marked. The patient was identified as Natalie Griffin and the procedure verified as a Robotic-assisted bilateral salpingo-oophorectomy with any other indicated procedures.   After induction of anesthesia, the patient was draped and prepped in the usual sterile manner. Patient was placed in supine position after anesthesia and draped and prepped in the usual sterile manner as follows: Her arms were tucked to her side with all appropriate precautions.  The patient was secured to the table with padding and tape across her chest.  The patient was placed in the semi-lithotomy position in [place].  The perineum and vagina were prepped with Betadine. The patient's abdomen was prepped with ChloraPrep and then she was draped after the prep had been allowed to dry for 3 minutes.  A Time Out was held and the above information confirmed.  The urethra was prepped with Betadine. Foley catheter was placed.   A sterile speculum was placed in the vagina.  The cervix was grasped with a single-tooth tenaculum. A Hulka uterine manipulator was placed without difficulty.  OG tube placement was confirmed and to suction.   Next, a 10 mm skin incision was made 1 cm below the subcostal margin in the midclavicular line.  The 5 mm Optiview port and scope was used for direct entry.  Opening pressure was under 10 mm CO2.  The abdomen was insufflated and the findings were noted as above.   At this point and all points during the procedure, the patient's intra-abdominal pressure did not exceed 15 mmHg. Next, an 8 mm skin incision was made superior to the umbilicus and a right and left port were placed about 8 cm lateral to the robot port on the right and left side.  The 5 mm assist trocar was exchanged for a 10-12 mm port. All ports were placed under direct visualization.  The patient was placed in steep Trendelenburg.  Bowel was folded away into the upper abdomen.  The robot was docked in the normal manner.  The right and left peritoneum were opened parallel to the IP ligament to open the retroperitoneal spaces bilaterally. The round ligaments were preserved. The ureter was noted to be on the medial leaf of the  broad ligament.  The peritoneum above the ureter was incised and stretched and the infundibulopelvic ligament was skeletonized, cauterized and cut.  The utero-ovarian ligament and fallopian tube were skeletonized, cauterized and transected just lateral to the uterine fundus, freeing the adnexa. Bilateral adnexa were placed in an Endocatch bag and removed through the assist trocar.  Irrigation was used and  excellent hemostasis was achieved.  At this point in the procedure was completed.  Robotic instruments were removed under direct visulaization.  The robot was undocked. The fascia at the 10-12 mm port was closed with 0 Vicryl using a PMI fascial closure device under direct visualization.  The subcuticular tissue was closed with 4-0 Vicryl and the skin was closed with 4-0 Monocryl in a subcuticular manner.  Dermabond was applied.    The Hulka manipulator was removed. The vagina was swabbed with minimal bleeding noted. Foley catheter was removed.  All sponge, lap and needle counts were correct x  3.   The patient was transferred to the recovery room in stable condition.  Jeral Pinch, MD

## 2022-09-30 NOTE — Transfer of Care (Signed)
Immediate Anesthesia Transfer of Care Note  Patient: Natalie Griffin  Procedure(s) Performed: XI ROBOTIC ASSISTED BILATERAL SALPINGO OOPHORECTOMY (Bilateral)  Patient Location: PACU  Anesthesia Type:General  Level of Consciousness: drowsy  Airway & Oxygen Therapy: Patient Spontanous Breathing and Patient connected to face mask oxygen  Post-op Assessment: Report given to RN, Post -op Vital signs reviewed and stable, and Patient moving all extremities X 4  Post vital signs: Reviewed and stable  Last Vitals:  Vitals Value Taken Time  BP 134/87   Temp    Pulse 70 09/30/22 1547  Resp 24 09/30/22 1547  SpO2 100 % 09/30/22 1547  Vitals shown include unvalidated device data.  Last Pain:  Vitals:   09/30/22 1115  TempSrc:   PainSc: 5          Complications: No notable events documented.

## 2022-10-01 ENCOUNTER — Encounter (HOSPITAL_COMMUNITY): Payer: Self-pay | Admitting: Gynecologic Oncology

## 2022-10-01 ENCOUNTER — Other Ambulatory Visit: Payer: Self-pay

## 2022-10-01 ENCOUNTER — Telehealth: Payer: Self-pay | Admitting: Surgery

## 2022-10-01 ENCOUNTER — Ambulatory Visit: Payer: Self-pay | Admitting: Hematology and Oncology

## 2022-10-01 NOTE — Telephone Encounter (Signed)
Spoke with Natalie Griffin this morning. She states she is eating, drinking and urinating well. She has not had a BM yet but is passing gas. She previously used prunes to prevent constipation and this has worked well for her, but states she has not had a BM in 2 days. Advised patient to take Miralax twice a day and Senna or Colace if unsuccessful with Miralax. Advised patient that if she does not have a BM by the end of the day to call our office tomorrow (10/02/22). Also encouraged her to drink plenty of water. She denies fever or chills. Incisions are dry and intact. She rates her pain 5/10. Her pain is controlled with MS Contin and Percocet.    Instructed to call office with any fever, chills, purulent drainage, uncontrolled pain or any other questions or concerns. Patient verbalizes understanding.   Pt aware of post op appointments as well as the office number [phone] and after hours number [phone] to call if she has any questions or concerns

## 2022-10-02 ENCOUNTER — Encounter: Payer: Self-pay | Admitting: Surgery

## 2022-10-02 ENCOUNTER — Other Ambulatory Visit (HOSPITAL_COMMUNITY): Payer: Self-pay

## 2022-10-02 ENCOUNTER — Other Ambulatory Visit: Payer: Self-pay | Admitting: Hematology and Oncology

## 2022-10-02 LAB — SURGICAL PATHOLOGY

## 2022-10-02 MED ORDER — OXYCODONE-ACETAMINOPHEN 10-325 MG PO TABS
1.0000 | ORAL_TABLET | Freq: Three times a day (TID) | ORAL | 0 refills | Status: DC | PRN
  Filled 2022-10-02: qty 12, 4d supply, fill #0
  Filled 2022-10-02: qty 90, 30d supply, fill #0
  Filled 2022-10-02: qty 12, 4d supply, fill #0
  Filled 2022-10-08: qty 78, 26d supply, fill #1

## 2022-10-08 ENCOUNTER — Other Ambulatory Visit (HOSPITAL_COMMUNITY): Payer: Self-pay

## 2022-10-09 ENCOUNTER — Other Ambulatory Visit: Payer: Self-pay | Admitting: Hematology and Oncology

## 2022-10-12 ENCOUNTER — Other Ambulatory Visit: Payer: Self-pay

## 2022-10-12 ENCOUNTER — Other Ambulatory Visit (HOSPITAL_COMMUNITY): Payer: Self-pay

## 2022-10-12 ENCOUNTER — Other Ambulatory Visit: Payer: Self-pay | Admitting: Hematology and Oncology

## 2022-10-12 MED ORDER — MORPHINE SULFATE ER 30 MG PO TBCR
30.0000 mg | EXTENDED_RELEASE_TABLET | Freq: Two times a day (BID) | ORAL | 0 refills | Status: DC
  Filled 2022-10-12: qty 60, 30d supply, fill #0

## 2022-10-20 ENCOUNTER — Inpatient Hospital Stay (HOSPITAL_COMMUNITY): Admission: RE | Admit: 2022-10-20 | Payer: 59 | Source: Ambulatory Visit

## 2022-10-23 ENCOUNTER — Inpatient Hospital Stay: Payer: 59

## 2022-10-23 ENCOUNTER — Inpatient Hospital Stay: Payer: 59 | Attending: Hematology and Oncology | Admitting: Gynecologic Oncology

## 2022-10-23 ENCOUNTER — Other Ambulatory Visit: Payer: Self-pay

## 2022-10-23 ENCOUNTER — Inpatient Hospital Stay (HOSPITAL_BASED_OUTPATIENT_CLINIC_OR_DEPARTMENT_OTHER): Payer: 59 | Admitting: Hematology and Oncology

## 2022-10-23 ENCOUNTER — Ambulatory Visit (HOSPITAL_BASED_OUTPATIENT_CLINIC_OR_DEPARTMENT_OTHER)
Admission: RE | Admit: 2022-10-23 | Discharge: 2022-10-23 | Disposition: A | Discharge status: Home / Self Care | Payer: 59 | Source: Ambulatory Visit | Attending: Hematology and Oncology | Admitting: Hematology and Oncology

## 2022-10-23 ENCOUNTER — Encounter: Payer: Self-pay | Admitting: Gynecologic Oncology

## 2022-10-23 VITALS — BP 109/62 | HR 93 | Temp 97.2°F | Resp 18 | Wt 175.1 lb

## 2022-10-23 VITALS — BP 111/80 | HR 97 | Temp 98.7°F | Resp 20 | Wt 175.3 lb

## 2022-10-23 DIAGNOSIS — C7951 Secondary malignant neoplasm of bone: Secondary | ICD-10-CM | POA: Insufficient documentation

## 2022-10-23 DIAGNOSIS — Z170 Estrogen receptor positive status [ER+]: Secondary | ICD-10-CM | POA: Insufficient documentation

## 2022-10-23 DIAGNOSIS — C50212 Malignant neoplasm of upper-inner quadrant of left female breast: Secondary | ICD-10-CM | POA: Diagnosis not present

## 2022-10-23 DIAGNOSIS — Z0189 Encounter for other specified special examinations: Secondary | ICD-10-CM | POA: Diagnosis not present

## 2022-10-23 DIAGNOSIS — Z90722 Acquired absence of ovaries, bilateral: Secondary | ICD-10-CM

## 2022-10-23 DIAGNOSIS — Z79811 Long term (current) use of aromatase inhibitors: Secondary | ICD-10-CM | POA: Insufficient documentation

## 2022-10-23 DIAGNOSIS — I371 Nonrheumatic pulmonary valve insufficiency: Secondary | ICD-10-CM | POA: Insufficient documentation

## 2022-10-23 DIAGNOSIS — Z9889 Other specified postprocedural states: Secondary | ICD-10-CM

## 2022-10-23 DIAGNOSIS — M25551 Pain in right hip: Secondary | ICD-10-CM | POA: Insufficient documentation

## 2022-10-23 DIAGNOSIS — I088 Other rheumatic multiple valve diseases: Secondary | ICD-10-CM | POA: Diagnosis not present

## 2022-10-23 LAB — ECHOCARDIOGRAM COMPLETE
Area-P 1/2: 1.6 cm2
S' Lateral: 2.5 cm

## 2022-10-23 NOTE — Progress Notes (Signed)
Patient Care Team: Sueanne Margarita, DO as PCP - General (Internal Medicine) Rockwell Germany, RN as Oncology Nurse Navigator Mauro Kaufmann, RN as Oncology Nurse Navigator  DIAGNOSIS:  Encounter Diagnosis  Name Primary?   Malignant neoplasm of upper-inner quadrant of left breast in female, estrogen receptor positive ([place]) Yes    SUMMARY OF ONCOLOGIC HISTORY: Oncology History  Malignant neoplasm of upper-inner quadrant of left breast in female, estrogen receptor positive ([place])  04/11/2020 Initial Diagnosis   Patient palpated a left breast lump. Mammogram on 03/08/20 showed a 3.0cm mass at the 11 o'clock position with no axillary adenopathy. 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   05/09/2020 Cancer Staging   Staging form: Breast, AJCC 8th Edition - Clinical stage from 05/09/2020: Stage IB (cT2, cN0, cM0, G2, ER+, PR+, HER2+) - Signed by Nicholas Lose, MD on 05/09/2020   06/04/2020 Surgery   Left lumpectomy ([name]): invasive and in situ ductal carcinoma, 3.2cm, clear margins, one left axillary lymph node negative for carcinoma.    02/2021 Relapse/Recurrence   Patient went to orthopedics for right hip pain.  Imaging revealed bone metastasis.  CT CAP: Cortical destruction of the right femoral neck consistent with skeletal metastases at risk for pathologic fracture.  Lucent lesion in the left iliac bone and L3 vertebral body consistent with multifocal skeletal metastases.   04/01/2021 Surgery   04/01/2021: Femoral intramedullary nail  Pathology review: Metastatic breast cancer ER 2% PR 0% HER2 3+ positive   04/08/2021 -  Anti-estrogen oral therapy   Tamoxifen 20 mg was prescribed on 04/08/2021 (discontinued by patient because she felt that the risks and benefits did not support it )--resumed in 07/2022 based on pathology results from right breast biopsy   05/14/2021 -  Chemotherapy   Subcutaneous Herceptin  Perjeta (Phesgo) injection started 05/14/2021, switched to Herceptin Hylecta 06/11/2021 (Fixed drug eruption, profound diarrhea to Phesgo)     05/20/2021 - 06/03/2021 Radiation Therapy   Palliative radiation to the iliac bone and hip   02/04/2022 Imaging   MRI thoracic spine 02/04/2022: Numerous bone metastases thoracic spine cervical and lumbar spines largest T5, T8, T11.  Chronic compression fractures T4, T5, T10 and T12    05/07/2022 Imaging   CT chest abdomen pelvis: No new or progressive bone metastases.  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   07/29/2022 PET scan   IMPRESSION: 1. Hypermetabolic mass in the posterior deep RIGHT breast consistent primary breast carcinoma. 2. Central nodal mediastinal metastasis to the high LEFT prevascular space and LEFT super clavicular node. 3. 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  [name] recommended changing treatment to [place] wanted to wait until after the holidays.  She will discuss further with Dr. Lindi Adie in January.   10/23/2022 -  Chemotherapy   Patient is on Treatment Plan : BREAST Trastuzumab IV (8/6) or SQ (600) D1 q21d       CHIEF COMPLIANT: left breast cancer/ Discuss treatment plan  INTERVAL HISTORY: Natalie Griffin is a 47 year old with above-mentioned history of left breast cancer. Currently on anastrozole and Herceptin. She presents to the clinic for a follow-up to discuss treatment plan.. She states that surgery  went well. She says the last 2 days she has been in grouch mode. She already have joint stiffness. One concern she have is in her left hip she is having a lot of pain.   ALLERGIES:  is allergic to adhesive [tape], fentanyl, phesgo  [pertuz-trastuz-hyaluron-zzxf], tetanus toxoid, succinylcholine, and contrast media [iodinated contrast media].  MEDICATIONS:  Current Outpatient Medications  Medication Sig Dispense Refill   ALPRAZolam (XANAX) 0.5 MG tablet Take 1 tablet (0.5 mg total) by mouth 3 (three) times daily as needed for anxiety. 60 tablet 3   anastrozole (ARIMIDEX) 1 MG tablet Take 1 tablet (1 mg total) by mouth daily. 90 tablet 3   calcium carbonate (OS-CAL) 600 MG TABS tablet Take 600 mg by mouth in the morning and at bedtime.     ferrous sulfate 324 MG TBEC Take 324 mg by mouth daily in the afternoon.     morphine (MS CONTIN) 30 MG 12 hr tablet Take 1 tablet (30 mg total) by mouth every 12 (twelve) hours. 60 tablet 0   Multiple Vitamins-Minerals (MULTIVITAMIN WITH MINERALS) tablet Take 1 tablet by mouth in the morning.     oxyCODONE-acetaminophen (PERCOCET) 10-325 MG tablet Take 1 tablet by mouth every 8 (eight) hours as needed for pain. 90 tablet 0   Vitamin D, Cholecalciferol, 25 MCG (1000 UT) TABS Take 1 tablet by mouth daily. 60 tablet    No current facility-administered medications for this visit.    PHYSICAL EXAMINATION: ECOG PERFORMANCE STATUS: 1 - Symptomatic but completely ambulatory  Vitals:   10/23/22 1200  BP: 109/62  Pulse: 93  Resp: 18  Temp: (!) 97.2 F (36.2 C)  SpO2: 95%   Filed Weights   10/23/22 1200  Weight: 175 lb 2 oz (79.4 kg)      LABORATORY DATA:  I have reviewed the data as listed    Latest Ref Rng & Units 09/23/2022    2:17 PM 09/17/2022   12:01 PM 08/06/2022    1:13 PM  CMP  Glucose 70 - 99 mg/dL 113  114  148   BUN 6 - 20 mg/dL '14  13  25   '$ Creatinine 0.44 - 1.00 mg/dL 0.98  0.79  0.83   Sodium 135 - 145 mmol/L 139  141  139   Potassium 3.5 - 5.1 mmol/L 4.8  4.2  4.2   Chloride 98 - 111 mmol/L 105  105  107   CO2 22 - 32 mmol/L '24  27  21   '$ Calcium 8.9 - 10.3 mg/dL 9.7  9.9  10.0   Total Protein 6.5 - 8.1 g/dL 7.6  7.4  7.7   Total Bilirubin 0.3 - 1.2 mg/dL  0.2  0.2  0.2   Alkaline Phos 38 - 126 U/L 160  193  126   AST 15 - 41 U/L 59  45  27   ALT 0 - 44 U/L 52  40  23     Lab Results  Component Value Date   WBC 5.5 09/23/2022   HGB 11.4 (L) 09/23/2022   HCT 37.4 09/23/2022   MCV 95.9 09/23/2022   PLT 292 09/23/2022   NEUTROABS 3.5 09/23/2022    ASSESSMENT & PLAN:  Malignant neoplasm of upper-inner quadrant of left breast in female, estrogen receptor positive ([place]) 06/04/2020:Left lumpectomy ([name]): invasive and in situ ductal carcinoma, 3.2cm, clear margins, one left axillary lymph node negative for carcinoma.  Patient refused adjuvant chemo, adjuvant radiation and adjuvant antiestrogen therapy.   Patient went  to orthopedics for right hip pain.  Imaging revealed bone metastasis.  CT CAP: Cortical destruction of the right femoral neck consistent with skeletal metastases at risk for pathologic fracture.  Lucent lesion in the left iliac bone and L3 vertebral body consistent with multifocal skeletal metastases. --------------------------------------------------------------- 04/01/2021: Femoral intramedullary nail  Pathology review: Metastatic breast cancer ER 2% PR 0% HER2 3+ positive   Treatment plan: Tamoxifen 20 mg was prescribed on 04/08/2021 (discontinued by patient because she felt that the risks and benefits did not support it), subcutaneous Herceptin Perjeta (Phesgo) injection started 05/14/2021, switched to Herceptin Hylecta 06/11/2021 (Fixed drug eruption, profound diarrhea to Phesgo)   Palliative radiation to the iliac bone and hip: 05/20/2021-06/03/2021     Mid back pain: MRI thoracic spine 02/04/2022: Numerous bone metastases thoracic spine cervical and lumbar spines largest T5, T8, T11.  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   CT chest abdomen pelvis 05/07/2022: No new or progressive bone metastases.   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%, HER2 3+ positive, Ki-67 35% Based on the biopsy results, we will continue with anti-HER2 therapy along with antiestrogen therapy.   Treatment plan change: Oophorectomy: 09/30/2022 Switch Tam to Anastrozole I recommended switching anti-HER2 therapy to Kadcyla.  She decided that she would not go on Kadcyla.  She will work to continue with the Herceptin Hylecta.  Because her insurance had changed hide elect I was no longer approved.  So we will have to do a peer to peer discussion and get it approved.  Until then she does not want to receive the IV Herceptin.   Pain control: MS Contin and Percocets that she takes as needed.  Worsening left hip pain: We will obtain CT chest abdomen pelvis with contrast for further evaluation. We will resume Herceptin Hylecta once it is approved. She will start anastrozole therapy as of now.   Orders Placed This Encounter  Procedures   CT CHEST ABDOMEN PELVIS W CONTRAST    Standing Status:   Future    Standing Expiration Date:   10/24/2023    Order Specific Question:   If indicated for the ordered procedure, I authorize the administration of contrast media per Radiology protocol    Answer:   Yes    Order Specific Question:   Does the patient have a contrast media/X-ray dye allergy?    Answer:   No    Order Specific Question:   Is patient pregnant?    Answer:   No    Order Specific Question:   Preferred imaging location?    Answer:   [facility]    Order Specific Question:   Is Oral Contrast requested for this exam?    Answer:   No oral contrast    Order Specific Question:   Reason for No Oral Contrast    Answer:   Other    Order Specific Question:   Please answer why no oral contrast is requested    Answer:   not necessary   The patient has a good understanding of the overall plan. she agrees with it. she will call with any problems that may develop  before the next visit here. Total time spent: 30 mins including face to face time and time spent for planning, charting and co-ordination of care  Harriette Ohara, MD 10/23/22    I Gardiner Coins am acting as a Education administrator for [employer]  I have reviewed the above documentation for accuracy and completeness, and I agree with the above.

## 2022-10-23 NOTE — Progress Notes (Signed)
Gynecologic Oncology Return Clinic Visit  10/23/22  Reason for Visit: Follow-up after surgery  Treatment History: Oncology History  Malignant neoplasm of upper-inner quadrant of left breast in female, estrogen receptor positive ([place])  04/11/2020 Initial Diagnosis   Patient palpated a left breast lump. Mammogram on 03/08/20 showed a 3.0cm mass at the 11 o'clock position with no axillary adenopathy. 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   05/09/2020 Cancer Staging   Staging form: Breast, AJCC 8th Edition - Clinical stage from 05/09/2020: Stage IB (cT2, cN0, cM0, G2, ER+, PR+, HER2+) - Signed by Nicholas Lose, MD on 05/09/2020   06/04/2020 Surgery   Left lumpectomy ([name]): invasive and in situ ductal carcinoma, 3.2cm, clear margins, one left axillary lymph node negative for carcinoma.    02/2021 Relapse/Recurrence   Patient went to orthopedics for right hip pain.  Imaging revealed bone metastasis.  CT CAP: Cortical destruction of the right femoral neck consistent with skeletal metastases at risk for pathologic fracture.  Lucent lesion in the left iliac bone and L3 vertebral body consistent with multifocal skeletal metastases.   04/01/2021 Surgery   04/01/2021: Femoral intramedullary nail  Pathology review: Metastatic breast cancer ER 2% PR 0% HER2 3+ positive   04/08/2021 -  Anti-estrogen oral therapy   Tamoxifen 20 mg was prescribed on 04/08/2021 (discontinued by patient because she felt that the risks and benefits did not support it )--resumed in 07/2022 based on pathology results from right breast biopsy   05/14/2021 -  Chemotherapy   Subcutaneous Herceptin Perjeta (Phesgo) injection started 05/14/2021, switched to Herceptin Hylecta 06/11/2021 (Fixed drug eruption, profound diarrhea to Phesgo)     05/20/2021 - 06/03/2021 Radiation Therapy   Palliative radiation to the iliac bone and hip   02/04/2022 Imaging   MRI  thoracic spine 02/04/2022: Numerous bone metastases thoracic spine cervical and lumbar spines largest T5, T8, T11.  Chronic compression fractures T4, T5, T10 and T12    05/07/2022 Imaging   CT chest abdomen pelvis: No new or progressive bone metastases.  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   07/29/2022 PET scan   IMPRESSION: 1. Hypermetabolic mass in the posterior deep RIGHT breast consistent primary breast carcinoma. 2. Central nodal mediastinal metastasis to the high LEFT prevascular space and LEFT super clavicular node. 3. 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  [name] recommended changing treatment to [place] wanted to wait until after the holidays.  She will discuss further with Dr. Lindi Adie in January.   10/23/2022 -  Chemotherapy   Patient is on Treatment Plan : BREAST Trastuzumab IV (8/6) or SQ (600) D1 q21d      09/30/2022: Robotic assisted BSO  Interval History: Doing well.  Denies any significant abdominal pain or pelvic pain.  Reports baseline bowel bladder function.  Developed a rash around some of her incisions about a week after surgery, used hazel and aloe with good relief.  Pruritus has resolved.  Having some increased hot flashes since the surgery.  Past Medical/Surgical History: Past Medical History:  Diagnosis Date   Anemia    Anxiety    Bone metastasis    Breast cancer ([place]) 2021   Left  breast invasive ductal carcinoma   Cancer ([place]) 2021   Left breast   Family history of adverse reaction to anesthesia    Father and Aunt have pseudocholinesterase deficiency - Trouble waking up from anesthesia   History of hiatal hernia    History of kidney stones    noted on CT Left  nonobstructive    Past Surgical History:  Procedure Laterality Date   BREAST BIOPSY Right 07/21/2022   [place] RT BREAST BX W LOC DEV 1ST LESION IMG BX SPEC US GUIDE 07/21/2022 [facility] MAMMOGRAPHY   BREAST LUMPECTOMY WITH RADIOACTIVE SEED AND SENTINEL LYMPH NODE BIOPSY Left 06/04/2020   Procedure: LEFT BREAST LUMPECTOMY WITH RADIOACTIVE SEED AND SENTINEL LYMPH NODE MAPPING;  Surgeon: Erroll Luna, MD;  Location: [place];  Service: General;  Laterality: Left;  PEC BLOCK, RNFA   FEMUR IM NAIL Right 04/01/2021   Procedure: INTRAMEDULLARY (IM) NAIL FEMORAL;  Surgeon: [name], MD;  Location: [facility];  Service: Orthopedics;  Laterality: Right;   ROBOTIC ASSISTED BILATERAL SALPINGO OOPHERECTOMY Bilateral 09/30/2022   Procedure: XI ROBOTIC ASSISTED BILATERAL SALPINGO OOPHORECTOMY;  Surgeon: Lafonda Mosses, MD;  Location: [facility];  Service: Gynecology;  Laterality: Bilateral;   WISDOM TOOTH EXTRACTION      Family History  Problem Relation Age of Onset   Colon cancer Neg Hx    Esophageal cancer Neg Hx    Stomach cancer Neg Hx    Rectal cancer Neg Hx     Social History   Socioeconomic History   Marital status: Single    Spouse name: Not on file   Number of children: Not on file   Years of education: Not on file   Highest education level: Not on file  Occupational History   Not on file  Tobacco Use   Smoking status: Former    Types: Cigarettes    Quit date: 05/19/2020    Years since quitting: 2.4   Smokeless tobacco: Never  Vaping Use   Vaping Use: Never used  Substance and Sexual Activity   Alcohol use: Not Currently   Drug use: Never   Sexual activity: Not Currently  Other Topics Concern   Not on file  Social History Narrative   Not on file   Social Determinants of Health   Financial Resource Strain: Not on file  Food Insecurity: Not on file  Transportation Needs: Not on file  Physical Activity: Not on file  Stress: Not on file  Social Connections: Not on file     Current Medications:  Current Outpatient Medications:    ALPRAZolam (XANAX) 0.5 MG tablet, Take 1 tablet (0.5 mg total) by mouth 3 (three) times daily as needed for anxiety., Disp: 60 tablet, Rfl: 3   anastrozole (ARIMIDEX) 1 MG tablet, Take 1 tablet (1 mg total) by mouth daily., Disp: 90 tablet, Rfl: 3   calcium carbonate (OS-CAL) 600 MG TABS tablet, Take 600 mg by mouth in the morning and at bedtime., Disp: , Rfl:    ferrous sulfate 324 MG TBEC, Take 324 mg by mouth daily in the afternoon., Disp: , Rfl:    morphine (MS CONTIN) 30 MG 12 hr tablet, Take 1 tablet (30 mg total) by mouth every 12 (twelve) hours., Disp: 60 tablet, Rfl: 0   Multiple Vitamins-Minerals (MULTIVITAMIN WITH MINERALS) tablet, Take 1 tablet by mouth in the morning., Disp: , Rfl:    oxyCODONE-acetaminophen (PERCOCET) 10-325 MG tablet, Take 1 tablet by mouth every 8 (eight) hours as needed for pain., Disp:  90 tablet, Rfl: 0   Vitamin D, Cholecalciferol, 25 MCG (1000 UT) TABS, Take 1 tablet by mouth daily., Disp: 60 tablet, Rfl:   Review of Systems: Denies appetite changes, fevers, chills, fatigue, unexplained weight changes. Denies hearing loss, neck lumps or masses, mouth sores, ringing in ears or voice changes. Denies cough or wheezing.  Denies shortness of breath. Denies chest pain or palpitations. Denies leg swelling. Denies abdominal distention, pain, blood in stools, constipation, diarrhea, nausea, vomiting, or early satiety. Denies pain with intercourse, dysuria, frequency, hematuria or incontinence. Denies hot flashes, pelvic pain, vaginal bleeding or vaginal discharge.   Denies joint pain, back pain or muscle pain/cramps. Denies itching, rash, or wounds. Denies dizziness, headaches, numbness or seizures. Denies swollen lymph nodes or glands, denies easy bruising or bleeding. Denies anxiety, depression, confusion, or decreased concentration.  Physical Exam: BP 111/80   Pulse 97   Temp 98.7 F (37.1 C)    Resp 20   Wt 175 lb 4.8 oz (79.5 kg)   LMP 07/10/2022 Comment: negative POCT 09/30/22  SpO2 99%   BMI 29.17 kg/m  General: Alert, oriented, no acute distress. HEENT: Normocephalic, atraumatic, sclera anicteric. Chest: Unlabored breathing on room air. Abdomen: soft, nontender.  Normoactive bowel sounds.  No masses or hepatosplenomegaly appreciated.  Well-healed incisions.  Mattress sutures excised from the supraumbilical incision.  Very small papules around most of her incisions, appear to be in the last stages of healing. Extremities: Grossly normal range of motion.  Warm, well perfused.  No edema bilaterally.  Laboratory & Radiologic Studies: A. OVARIES AND FALLOPIAN TUBES, BILATERAL, SALPINGO OOPHORECTOMY:  - Bilateral fallopian tubes and ovaries with no significant pathologic  changes  - No carcinoma identified   Assessment & Plan: Mariamawit A Larranaga is a 47 y.o. woman with ER+ breast cancer now s/p therapeutic BSO.  Patient is doing well, meeting postoperative milestones.  Suspect that she had a reaction either to the prep or the glue although the timing of her symptoms starting a week after is a little bit unusual.  We discussed continued expectations and restrictions.  She will follow-up with her primary care provider and medical oncologist.  18 minutes of total time was spent for this patient encounter, including preparation, face-to-face counseling with the patient and coordination of care, and documentation of the encounter.  Jeral Pinch, MD  Division of Gynecologic Oncology  Department of Obstetrics and Gynecology  [facility]

## 2022-10-23 NOTE — Assessment & Plan Note (Signed)
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  Patient refused adjuvant chemo, adjuvant radiation and adjuvant antiestrogen therapy.   Patient went to orthopedics for right hip pain.  Imaging revealed bone metastasis.  CT CAP: Cortical destruction of the right femoral neck consistent with skeletal metastases at risk for pathologic fracture.  Lucent lesion in the left iliac bone and L3 vertebral body consistent with multifocal skeletal metastases. --------------------------------------------------------------- 04/01/2021: Femoral intramedullary nail  Pathology review: Metastatic breast cancer ER 2% PR 0% HER2 3+ positive   Treatment plan: Tamoxifen 20 mg was prescribed on 04/08/2021 (discontinued by patient because she felt that the risks and benefits did not support it), subcutaneous Herceptin Perjeta (Phesgo) injection started 05/14/2021, switched to Herceptin Hylecta 06/11/2021 (Fixed drug eruption, profound diarrhea to Phesgo)   Palliative radiation to the iliac bone and hip: 05/20/2021-06/03/2021     Mid back pain: MRI thoracic spine 02/04/2022: Numerous bone metastases thoracic spine cervical and lumbar spines largest T5, T8, T11.  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   CT chest abdomen pelvis 05/07/2022: No new or progressive bone metastases.  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%, HER2 3+ positive, Ki-67 35% Based on the biopsy results, we will continue with anti-HER2 therapy along with antiestrogen therapy.   Treatment plan change: Oophorectomy: 09/30/2022 Switch Tam to Anastrozole I recommended switching anti-HER2 therapy to Kadcyla.  She is willing to consider doing Kadcyla in January.  We might do it for the  first cycle using a peripheral IV and then see how she does before we place a port.   Pain control: MS Contin and Percocets that she takes as needed.

## 2022-10-23 NOTE — Progress Notes (Signed)
Echocardiogram 2D Echocardiogram has been performed.  Darlina Sicilian M 10/23/2022, 11:01 AM

## 2022-10-23 NOTE — Patient Instructions (Addendum)
It was great to see you today.  You are healing very well from surgery.  Please remember, no heavy lifting for 6 weeks after surgery.  OBGYN recommendations Dr. Edwinna Areola: [phone] Dr. Eula Flax: [phone]

## 2022-10-27 ENCOUNTER — Other Ambulatory Visit (HOSPITAL_COMMUNITY): Payer: Self-pay

## 2022-10-27 ENCOUNTER — Other Ambulatory Visit: Payer: Self-pay | Admitting: *Deleted

## 2022-10-27 ENCOUNTER — Encounter: Payer: Self-pay | Admitting: Hematology and Oncology

## 2022-10-27 MED ORDER — PREDNISONE 50 MG PO TABS
ORAL_TABLET | ORAL | 0 refills | Status: DC
  Filled 2022-10-27: qty 3, 1d supply, fill #0

## 2022-10-27 NOTE — Progress Notes (Signed)
Due to pt hx of allergic reaction to IV contrast verbal orders received from MD for pt to receive 13 hour prep with 50 mg p.o prednisone and 50 mg p.o OTC benadryl. Pt  needing to be educated to take 1 tablet of p.o prednisone 13 hours prior to scan, 2nd tablet of prednisone 7 hours prior to scan and 3rd tablet of prednisone 1 hour prior to scan.  Pt also educated to take 50 mg p.o OTC benadryl 1 hours prior to the scan.  Prednisone prescription sent to pharmacy on file.   RN attempt x1 to contact pt with information, no answer.  Left detailed VM and requested pt to return call to the office with any questions or concerns.

## 2022-10-30 ENCOUNTER — Telehealth: Payer: Self-pay | Admitting: *Deleted

## 2022-10-30 ENCOUNTER — Ambulatory Visit (HOSPITAL_COMMUNITY): Payer: 59

## 2022-10-30 NOTE — Telephone Encounter (Signed)
"  Mark [phone] ext. [phone]) with [facility] calling about patient Natalie Griffin, 15-Aug-1976, claim no#: [id number] a patient of Dr. Lindi Adie.  Fax was sent 10/23/2022 with Attending Physician form and request for records from 08/21/2022 through 10/23/2022.  Call if any questions or not received.  Fax number is [phone]." Per Excel form tracker request has been received on 10/23/2022.  Form staff ask to allow 7-10 business days (14 calendar) to process.  Request needs [place] ROI signed by patient, legal guardian or [place] for [employer] ([place]) H.I.M. Department to process record request upon completion of form.

## 2022-10-30 NOTE — Telephone Encounter (Signed)
10/30/2022: This nurse noted unexpired ROI signed 08/13/2022 for the [facility].

## 2022-11-02 ENCOUNTER — Encounter: Payer: Self-pay | Admitting: Hematology and Oncology

## 2022-11-03 ENCOUNTER — Other Ambulatory Visit: Payer: Self-pay | Admitting: *Deleted

## 2022-11-03 DIAGNOSIS — C7951 Secondary malignant neoplasm of bone: Secondary | ICD-10-CM

## 2022-11-03 DIAGNOSIS — Z170 Estrogen receptor positive status [ER+]: Secondary | ICD-10-CM

## 2022-11-03 NOTE — Progress Notes (Signed)
Pt complaint of ongoing headaches.  Verbal orders received from MD to obtain MRI of the brain.  Orders placed.

## 2022-11-07 ENCOUNTER — Ambulatory Visit (HOSPITAL_COMMUNITY): Payer: 59

## 2022-11-08 ENCOUNTER — Ambulatory Visit (HOSPITAL_COMMUNITY)
Admission: RE | Admit: 2022-11-08 | Discharge: 2022-11-08 | Disposition: A | Discharge status: Home / Self Care | Payer: 59 | Source: Ambulatory Visit | Attending: Hematology and Oncology | Admitting: Hematology and Oncology

## 2022-11-08 DIAGNOSIS — Z170 Estrogen receptor positive status [ER+]: Secondary | ICD-10-CM | POA: Diagnosis not present

## 2022-11-08 DIAGNOSIS — C7951 Secondary malignant neoplasm of bone: Secondary | ICD-10-CM | POA: Diagnosis not present

## 2022-11-08 DIAGNOSIS — C50212 Malignant neoplasm of upper-inner quadrant of left female breast: Secondary | ICD-10-CM | POA: Diagnosis not present

## 2022-11-09 ENCOUNTER — Other Ambulatory Visit (HOSPITAL_COMMUNITY): Payer: Self-pay

## 2022-11-09 ENCOUNTER — Ambulatory Visit (HOSPITAL_COMMUNITY): Payer: 59

## 2022-11-09 ENCOUNTER — Emergency Department (HOSPITAL_COMMUNITY)
Admission: EM | Admit: 2022-11-09 | Discharge: 2022-11-09 | Disposition: A | Discharge status: Home / Self Care | Payer: 59

## 2022-11-09 DIAGNOSIS — C7951 Secondary malignant neoplasm of bone: Secondary | ICD-10-CM | POA: Diagnosis not present

## 2022-11-09 DIAGNOSIS — R519 Headache, unspecified: Secondary | ICD-10-CM | POA: Diagnosis not present

## 2022-11-09 DIAGNOSIS — Z170 Estrogen receptor positive status [ER+]: Secondary | ICD-10-CM | POA: Diagnosis not present

## 2022-11-09 DIAGNOSIS — G9389 Other specified disorders of brain: Secondary | ICD-10-CM | POA: Diagnosis not present

## 2022-11-09 DIAGNOSIS — C50212 Malignant neoplasm of upper-inner quadrant of left female breast: Secondary | ICD-10-CM | POA: Diagnosis not present

## 2022-11-09 MED ORDER — GADOBUTROL 1 MMOL/ML IV SOLN
7.0000 mL | Freq: Once | INTRAVENOUS | Status: AC | PRN
  Administered 2022-11-09: 7 mL via INTRAVENOUS

## 2022-11-11 ENCOUNTER — Other Ambulatory Visit: Payer: Self-pay | Admitting: Hematology and Oncology

## 2022-11-11 ENCOUNTER — Telehealth: Payer: Self-pay

## 2022-11-11 ENCOUNTER — Other Ambulatory Visit: Payer: Self-pay

## 2022-11-11 ENCOUNTER — Other Ambulatory Visit (HOSPITAL_COMMUNITY): Payer: Self-pay

## 2022-11-11 MED ORDER — OXYCODONE-ACETAMINOPHEN 10-325 MG PO TABS
1.0000 | ORAL_TABLET | Freq: Three times a day (TID) | ORAL | 0 refills | Status: DC | PRN
  Filled 2022-11-11: qty 90, 30d supply, fill #0

## 2022-11-11 MED ORDER — MORPHINE SULFATE ER 30 MG PO TBCR
30.0000 mg | EXTENDED_RELEASE_TABLET | Freq: Two times a day (BID) | ORAL | 0 refills | Status: DC
  Filled 2022-11-11: qty 60, 30d supply, fill #0

## 2022-11-11 MED ORDER — VITAMIN D (CHOLECALCIFEROL) 25 MCG (1000 UT) PO TABS: 1.0000 | ORAL_TABLET | Freq: Every day | ORAL | 3 refills | Status: DC

## 2022-11-11 NOTE — Telephone Encounter (Signed)
Notified patient of completion of Attending Physician's Statement for Disability Claim Number [id number]. Request for medical records forwarded to [place] [facility] Office. Fax transmission confirmation received, Copy of form and request for medical records mailed to patient as requested. No other needs or concerns voiced at this time.

## 2022-11-18 ENCOUNTER — Ambulatory Visit (HOSPITAL_BASED_OUTPATIENT_CLINIC_OR_DEPARTMENT_OTHER)
Admission: RE | Admit: 2022-11-18 | Discharge: 2022-11-18 | Disposition: A | Discharge status: Home / Self Care | Payer: 59 | Source: Ambulatory Visit | Attending: Hematology and Oncology | Admitting: Hematology and Oncology

## 2022-11-18 DIAGNOSIS — C7951 Secondary malignant neoplasm of bone: Secondary | ICD-10-CM | POA: Diagnosis not present

## 2022-11-18 DIAGNOSIS — R918 Other nonspecific abnormal finding of lung field: Secondary | ICD-10-CM | POA: Diagnosis not present

## 2022-11-18 DIAGNOSIS — C50919 Malignant neoplasm of unspecified site of unspecified female breast: Secondary | ICD-10-CM | POA: Diagnosis not present

## 2022-11-18 DIAGNOSIS — N200 Calculus of kidney: Secondary | ICD-10-CM | POA: Diagnosis not present

## 2022-11-18 DIAGNOSIS — C50212 Malignant neoplasm of upper-inner quadrant of left female breast: Secondary | ICD-10-CM | POA: Diagnosis not present

## 2022-11-18 DIAGNOSIS — Z170 Estrogen receptor positive status [ER+]: Secondary | ICD-10-CM | POA: Diagnosis not present

## 2022-11-18 DIAGNOSIS — K760 Fatty (change of) liver, not elsewhere classified: Secondary | ICD-10-CM | POA: Diagnosis not present

## 2022-11-18 MED ORDER — METHYLPREDNISOLONE SODIUM SUCC 40 MG IJ SOLR: 40.0000 mg | Freq: Once | INTRAMUSCULAR | Status: DC

## 2022-11-18 MED ORDER — DIPHENHYDRAMINE HCL 50 MG/ML IJ SOLN: 50.0000 mg | Freq: Once | INTRAMUSCULAR | Status: DC

## 2022-11-18 MED ORDER — IOHEXOL 300 MG/ML  SOLN
100.0000 mL | Freq: Once | INTRAMUSCULAR | Status: AC | PRN
  Administered 2022-11-18: 80 mL via INTRAVENOUS

## 2022-11-18 MED ORDER — DIPHENHYDRAMINE HCL 25 MG PO CAPS: 50.0000 mg | ORAL_CAPSULE | Freq: Once | ORAL | Status: DC

## 2022-11-19 ENCOUNTER — Inpatient Hospital Stay: Payer: 59

## 2022-11-19 ENCOUNTER — Inpatient Hospital Stay: Payer: 59 | Admitting: Hematology and Oncology

## 2022-11-19 NOTE — Progress Notes (Signed)
Patient Care Team: Sueanne Margarita, DO as PCP - General (Internal Medicine) Rockwell Germany, RN as Oncology Nurse Navigator Mauro Kaufmann, RN as Oncology Nurse Navigator  DIAGNOSIS: No diagnosis found.  SUMMARY OF ONCOLOGIC HISTORY: Oncology History  Malignant neoplasm of upper-inner quadrant of left breast in female, estrogen receptor positive ([place])  04/11/2020 Initial Diagnosis   Patient palpated a left breast lump. Mammogram on 03/08/20 showed a 3.0cm mass at the 11 o'clock position with no axillary adenopathy. 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   05/09/2020 Cancer Staging   Staging form: Breast, AJCC 8th Edition - Clinical stage from 05/09/2020: Stage IB (cT2, cN0, cM0, G2, ER+, PR+, HER2+) - Signed by Nicholas Lose, MD on 05/09/2020   06/04/2020 Surgery   Left lumpectomy ([name]): invasive and in situ ductal carcinoma, 3.2cm, clear margins, one left axillary lymph node negative for carcinoma.    02/2021 Relapse/Recurrence   Patient went to orthopedics for right hip pain.  Imaging revealed bone metastasis.  CT CAP: Cortical destruction of the right femoral neck consistent with skeletal metastases at risk for pathologic fracture.  Lucent lesion in the left iliac bone and L3 vertebral body consistent with multifocal skeletal metastases.   04/01/2021 Surgery   04/01/2021: Femoral intramedullary nail  Pathology review: Metastatic breast cancer ER 2% PR 0% HER2 3+ positive   04/08/2021 -  Anti-estrogen oral therapy   Tamoxifen 20 mg was prescribed on 04/08/2021 (discontinued by patient because she felt that the risks and benefits did not support it )--resumed in 07/2022 based on pathology results from right breast biopsy   05/14/2021 -  Chemotherapy   Subcutaneous Herceptin Perjeta (Phesgo) injection started 05/14/2021, switched to Herceptin Hylecta 06/11/2021 (Fixed drug eruption, profound diarrhea to  Phesgo)     05/20/2021 - 06/03/2021 Radiation Therapy   Palliative radiation to the iliac bone and hip   02/04/2022 Imaging   MRI thoracic spine 02/04/2022: Numerous bone metastases thoracic spine cervical and lumbar spines largest T5, T8, T11.  Chronic compression fractures T4, T5, T10 and T12    05/07/2022 Imaging   CT chest abdomen pelvis: No new or progressive bone metastases.  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   07/29/2022 PET scan   IMPRESSION: 1. Hypermetabolic mass in the posterior deep RIGHT breast consistent primary breast carcinoma. 2. Central nodal mediastinal metastasis to the high LEFT prevascular space and LEFT super clavicular node. 3. 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  [name] recommended changing treatment to [place] wanted to wait until after the holidays.  She will discuss further with Dr. Lindi Adie in January.   11/19/2022 -  Chemotherapy   Patient is on Treatment Plan : BREAST Trastuzumab IV (8/6) or SQ (600) D1 q21d       CHIEF COMPLIANT: left breast cancer   INTERVAL HISTORY: Natalie Griffin is a  47 year old with above-mentioned history of left breast cancer. Currently on anastrozole and Herceptin. She presents to the clinic for a follow-up.   ALLERGIES:  is allergic to adhesive [tape], fentanyl, phesgo [pertuz-trastuz-hyaluron-zzxf], tetanus toxoid, succinylcholine, and contrast media [iodinated contrast media].  MEDICATIONS:  Current Outpatient Medications  Medication Sig Dispense  Refill   ALPRAZolam (XANAX) 0.5 MG tablet Take 1 tablet (0.5 mg total) by mouth 3 (three) times daily as needed for anxiety. 60 tablet 3   anastrozole (ARIMIDEX) 1 MG tablet Take 1  tablet (1 mg total) by mouth daily. 90 tablet 3   calcium carbonate (OS-CAL) 600 MG TABS tablet Take 600 mg by mouth in the morning and at bedtime.     ferrous sulfate 324 MG TBEC Take 324 mg by mouth daily in the afternoon.     morphine (MS CONTIN) 30 MG 12 hr tablet Take 1 tablet (30 mg total) by mouth every 12 (twelve) hours. 60 tablet 0   Multiple Vitamins-Minerals (MULTIVITAMIN WITH MINERALS) tablet Take 1 tablet by mouth in the morning.     oxyCODONE-acetaminophen (PERCOCET) 10-325 MG tablet Take 1 tablet by mouth every 8 (eight) hours as needed for pain. 90 tablet 0   predniSONE (DELTASONE) 50 MG tablet 1 tablet 13 hrs prior, 1 tablet 7 hours prior and 1 tablet 1 hr prior to scan. 3 tablet 0   Vitamin D, Cholecalciferol, 25 MCG (1000 UT) TABS Take 1 tablet by mouth daily. 60 tablet 3   No current facility-administered medications for this visit.    PHYSICAL EXAMINATION: ECOG PERFORMANCE STATUS: {CHL ONC ECOG PS:[phone]}  There were no vitals filed for this visit. There were no vitals filed for this visit.  BREAST:*** No palpable masses or nodules in either right or left breasts. No palpable axillary supraclavicular or infraclavicular adenopathy no breast tenderness or nipple discharge. (exam performed in the presence of a chaperone)  LABORATORY DATA:  I have reviewed the data as listed    Latest Ref Rng & Units 09/23/2022    2:17 PM 09/17/2022   12:01 PM 08/06/2022    1:13 PM  CMP  Glucose 70 - 99 mg/dL 113  114  148   BUN 6 - 20 mg/dL '14  13  25   '$ Creatinine 0.44 - 1.00 mg/dL 0.98  0.79  0.83   Sodium 135 - 145 mmol/L 139  141  139   Potassium 3.5 - 5.1 mmol/L 4.8  4.2  4.2   Chloride 98 - 111 mmol/L 105  105  107   CO2 22 - 32 mmol/L '24  27  21   '$ Calcium 8.9 - 10.3 mg/dL 9.7  9.9  10.0   Total Protein 6.5 - 8.1 g/dL 7.6  7.4  7.7   Total Bilirubin 0.3 - 1.2 mg/dL 0.2  0.2  0.2   Alkaline Phos 38 - 126 U/L 160  193  126   AST 15 - 41 U/L 59  45  27   ALT 0 - 44 U/L 52   40  23     Lab Results  Component Value Date   WBC 5.5 09/23/2022   HGB 11.4 (L) 09/23/2022   HCT 37.4 09/23/2022   MCV 95.9 09/23/2022   PLT 292 09/23/2022   NEUTROABS 3.5 09/23/2022    ASSESSMENT & PLAN:  No problem-specific Assessment & Plan notes found for this encounter.    No orders of the defined types were placed in this encounter.  The patient has a good understanding of the overall plan. she agrees with it. she will call with any problems that may develop before the next visit here. Total time spent: 30 mins including face to face time and time spent for planning, charting and co-ordination of care   Suzzette Righter, [place] 11/19/22  I Gardiner Coins am acting as a Education administrator for [employer]  ***

## 2022-11-24 ENCOUNTER — Ambulatory Visit (HOSPITAL_COMMUNITY)
Admission: RE | Admit: 2022-11-24 | Discharge: 2022-11-24 | Disposition: A | Discharge status: Home / Self Care | Payer: 59 | Source: Ambulatory Visit | Attending: Hematology and Oncology | Admitting: Hematology and Oncology

## 2022-11-24 ENCOUNTER — Inpatient Hospital Stay: Payer: 59

## 2022-11-24 ENCOUNTER — Inpatient Hospital Stay: Payer: 59 | Attending: Hematology and Oncology | Admitting: Hematology and Oncology

## 2022-11-24 VITALS — BP 124/84 | HR 118 | Temp 97.5°F | Resp 18 | Ht 65.0 in | Wt 174.6 lb

## 2022-11-24 DIAGNOSIS — C50212 Malignant neoplasm of upper-inner quadrant of left female breast: Secondary | ICD-10-CM | POA: Insufficient documentation

## 2022-11-24 DIAGNOSIS — C7951 Secondary malignant neoplasm of bone: Secondary | ICD-10-CM | POA: Insufficient documentation

## 2022-11-24 DIAGNOSIS — Z170 Estrogen receptor positive status [ER+]: Secondary | ICD-10-CM | POA: Insufficient documentation

## 2022-11-24 DIAGNOSIS — M25552 Pain in left hip: Secondary | ICD-10-CM | POA: Diagnosis not present

## 2022-11-24 NOTE — Assessment & Plan Note (Addendum)
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  Patient refused adjuvant chemo, adjuvant radiation and adjuvant antiestrogen therapy.   Patient went to orthopedics for right hip pain.  Imaging revealed bone metastasis.  CT CAP: Cortical destruction of the right femoral neck consistent with skeletal metastases at risk for pathologic fracture.  Lucent lesion in the left iliac bone and L3 vertebral body consistent with multifocal skeletal metastases. --------------------------------------------------------------- 04/01/2021: Femoral intramedullary nail  Pathology review: Metastatic breast cancer ER 2% PR 0% HER2 3+ positive   Treatment plan: Tamoxifen 20 mg was prescribed on 04/08/2021 (discontinued by patient because she felt that the risks and benefits did not support it), subcutaneous Herceptin Perjeta (Phesgo) injection started 05/14/2021, switched to Herceptin Hylecta 06/11/2021 (Fixed drug eruption, profound diarrhea to Phesgo)   Palliative radiation to the iliac bone and hip: 05/20/2021-06/03/2021     Mid back pain: MRI thoracic spine 02/04/2022: Numerous bone metastases thoracic spine cervical and lumbar spines largest T5, T8, T11.  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   CT chest abdomen pelvis 05/07/2022: No new or progressive bone metastases.  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%, HER2 3+ positive, Ki-67 35%     Treatment plan change: Oophorectomy: 09/30/2022 Switch Tam to Anastrozole Patient refused switching from Herceptin to Kadcyla (her insurance is denied Herceptin Hylecta and patient does not want to receive it in IV form)  Pain control: MS Contin and Percocets that she takes as needed   11/19/2022: CT  CAP: Increased size of right breast mass, left supraclavicular and anterior mediastinal lymph node significantly decreased in size.  Sclerotic bone metastases throughout spine pelvis MRI. 11/10/2022: MRI brain: Skull metastases.  No evidence of brain involvement  Left hip pain: Will obtain a x-ray of the left hip today.  If necessary we will order an MRI.  We will evaluate if she needs palliative radiation.  Patient does not want to do Herceptin anymore because of multiple reasons including the fact that the application for drug assistance seems to ask for personal information and she felt that her privacy could be violated in the process.  We discussed the pros and cons of neratinib and she will think about it and will let us know.  Breast nodule: Increasing in size we will obtain a mammogram and ultrasound and see her back in 1 month to discuss if removing the breast lump makes sense.

## 2022-11-26 ENCOUNTER — Telehealth: Payer: Self-pay | Admitting: Internal Medicine

## 2022-11-26 NOTE — Telephone Encounter (Signed)
Per 3/5 LOS reached out to patient to schedule, patient stated she will be out of town that day and wants to call back after she has her mammogram date.

## 2022-12-07 ENCOUNTER — Other Ambulatory Visit: Payer: 59

## 2022-12-08 ENCOUNTER — Other Ambulatory Visit: Payer: Self-pay

## 2022-12-16 NOTE — Progress Notes (Signed)
Leave as an Accommodation forms completed on 12/11/2022 and faxed to [employer] as requested at [phone]. Received voicemail today stating that forms had not been received. Patient is requesting additional continuous leave from 11/19/2022 through 02/15/2023. Forms completed accordingly and faxed to [employer] and emailed to [place] Compliance Specialist as requested by Patient. Copy of forms emailed to Patient as requested.

## 2022-12-17 ENCOUNTER — Ambulatory Visit: Payer: 59

## 2022-12-17 ENCOUNTER — Other Ambulatory Visit: Payer: Self-pay | Admitting: Hematology and Oncology

## 2022-12-17 ENCOUNTER — Other Ambulatory Visit: Payer: Self-pay

## 2022-12-17 ENCOUNTER — Ambulatory Visit
Admission: RE | Admit: 2022-12-17 | Discharge: 2022-12-17 | Disposition: A | Discharge status: Home / Self Care | Payer: 59 | Source: Ambulatory Visit | Attending: Hematology and Oncology | Admitting: Hematology and Oncology

## 2022-12-17 ENCOUNTER — Telehealth: Payer: Self-pay | Admitting: Hematology and Oncology

## 2022-12-17 ENCOUNTER — Inpatient Hospital Stay: Payer: 59 | Admitting: Adult Health

## 2022-12-17 DIAGNOSIS — C50212 Malignant neoplasm of upper-inner quadrant of left female breast: Secondary | ICD-10-CM

## 2022-12-17 DIAGNOSIS — R922 Inconclusive mammogram: Secondary | ICD-10-CM | POA: Diagnosis not present

## 2022-12-17 DIAGNOSIS — N6311 Unspecified lump in the right breast, upper outer quadrant: Secondary | ICD-10-CM | POA: Diagnosis not present

## 2022-12-17 NOTE — Telephone Encounter (Signed)
Scheduled appointment per patients request. Patient is aware of the made appointment.

## 2022-12-21 ENCOUNTER — Other Ambulatory Visit (HOSPITAL_COMMUNITY): Payer: Self-pay

## 2022-12-21 ENCOUNTER — Other Ambulatory Visit: Payer: Self-pay | Admitting: Hematology and Oncology

## 2022-12-21 MED ORDER — OXYCODONE-ACETAMINOPHEN 10-325 MG PO TABS
1.0000 | ORAL_TABLET | Freq: Three times a day (TID) | ORAL | 0 refills | Status: DC | PRN
  Filled 2022-12-21: qty 90, 30d supply, fill #0

## 2022-12-21 MED ORDER — MORPHINE SULFATE ER 30 MG PO TBCR
30.0000 mg | EXTENDED_RELEASE_TABLET | Freq: Two times a day (BID) | ORAL | 0 refills | Status: DC
  Filled 2022-12-21: qty 60, 30d supply, fill #0

## 2022-12-24 ENCOUNTER — Other Ambulatory Visit: Payer: 59

## 2022-12-25 ENCOUNTER — Encounter: Payer: Self-pay | Admitting: Hematology and Oncology

## 2022-12-29 ENCOUNTER — Emergency Department (HOSPITAL_COMMUNITY): Payer: 59

## 2022-12-29 ENCOUNTER — Inpatient Hospital Stay (HOSPITAL_COMMUNITY)
Admission: EM | Admit: 2022-12-29 | Discharge: 2022-12-31 | DRG: 095 | Disposition: A | Discharge status: Home / Self Care | Payer: 59 | Attending: Internal Medicine | Admitting: Internal Medicine

## 2022-12-29 ENCOUNTER — Other Ambulatory Visit: Payer: Self-pay

## 2022-12-29 ENCOUNTER — Encounter (HOSPITAL_COMMUNITY): Payer: Self-pay

## 2022-12-29 ENCOUNTER — Inpatient Hospital Stay: Payer: 59 | Admitting: Hematology and Oncology

## 2022-12-29 ENCOUNTER — Encounter: Payer: Self-pay | Admitting: *Deleted

## 2022-12-29 DIAGNOSIS — E876 Hypokalemia: Secondary | ICD-10-CM | POA: Diagnosis not present

## 2022-12-29 DIAGNOSIS — Z79811 Long term (current) use of aromatase inhibitors: Secondary | ICD-10-CM

## 2022-12-29 DIAGNOSIS — F419 Anxiety disorder, unspecified: Secondary | ICD-10-CM | POA: Diagnosis present

## 2022-12-29 DIAGNOSIS — J9811 Atelectasis: Secondary | ICD-10-CM | POA: Diagnosis not present

## 2022-12-29 DIAGNOSIS — Z885 Allergy status to narcotic agent status: Secondary | ICD-10-CM

## 2022-12-29 DIAGNOSIS — Z170 Estrogen receptor positive status [ER+]: Secondary | ICD-10-CM

## 2022-12-29 DIAGNOSIS — Z853 Personal history of malignant neoplasm of breast: Secondary | ICD-10-CM

## 2022-12-29 DIAGNOSIS — Z91048 Other nonmedicinal substance allergy status: Secondary | ICD-10-CM

## 2022-12-29 DIAGNOSIS — Z79899 Other long term (current) drug therapy: Secondary | ICD-10-CM | POA: Diagnosis not present

## 2022-12-29 DIAGNOSIS — C50919 Malignant neoplasm of unspecified site of unspecified female breast: Secondary | ICD-10-CM

## 2022-12-29 DIAGNOSIS — M84451A Pathological fracture, right femur, initial encounter for fracture: Secondary | ICD-10-CM | POA: Diagnosis present

## 2022-12-29 DIAGNOSIS — Z7189 Other specified counseling: Secondary | ICD-10-CM | POA: Diagnosis not present

## 2022-12-29 DIAGNOSIS — Z66 Do not resuscitate: Secondary | ICD-10-CM | POA: Diagnosis not present

## 2022-12-29 DIAGNOSIS — Z515 Encounter for palliative care: Secondary | ICD-10-CM | POA: Diagnosis not present

## 2022-12-29 DIAGNOSIS — M40204 Unspecified kyphosis, thoracic region: Secondary | ICD-10-CM | POA: Diagnosis not present

## 2022-12-29 DIAGNOSIS — R000 Tachycardia, unspecified: Secondary | ICD-10-CM | POA: Diagnosis not present

## 2022-12-29 DIAGNOSIS — K760 Fatty (change of) liver, not elsewhere classified: Secondary | ICD-10-CM | POA: Diagnosis not present

## 2022-12-29 DIAGNOSIS — M4620 Osteomyelitis of vertebra, site unspecified: Secondary | ICD-10-CM | POA: Diagnosis present

## 2022-12-29 DIAGNOSIS — Z888 Allergy status to other drugs, medicaments and biological substances status: Secondary | ICD-10-CM | POA: Diagnosis not present

## 2022-12-29 DIAGNOSIS — G061 Intraspinal abscess and granuloma: Secondary | ICD-10-CM | POA: Diagnosis not present

## 2022-12-29 DIAGNOSIS — M4644 Discitis, unspecified, thoracic region: Secondary | ICD-10-CM | POA: Diagnosis not present

## 2022-12-29 DIAGNOSIS — R809 Proteinuria, unspecified: Secondary | ICD-10-CM | POA: Diagnosis present

## 2022-12-29 DIAGNOSIS — M4624 Osteomyelitis of vertebra, thoracic region: Secondary | ICD-10-CM | POA: Diagnosis not present

## 2022-12-29 DIAGNOSIS — M62838 Other muscle spasm: Secondary | ICD-10-CM | POA: Diagnosis present

## 2022-12-29 DIAGNOSIS — C7951 Secondary malignant neoplasm of bone: Secondary | ICD-10-CM | POA: Diagnosis not present

## 2022-12-29 DIAGNOSIS — Z87442 Personal history of urinary calculi: Secondary | ICD-10-CM

## 2022-12-29 DIAGNOSIS — C50212 Malignant neoplasm of upper-inner quadrant of left female breast: Secondary | ICD-10-CM | POA: Diagnosis not present

## 2022-12-29 DIAGNOSIS — Z887 Allergy status to serum and vaccine status: Secondary | ICD-10-CM | POA: Diagnosis not present

## 2022-12-29 DIAGNOSIS — S22070A Wedge compression fracture of T9-T10 vertebra, initial encounter for closed fracture: Secondary | ICD-10-CM | POA: Diagnosis not present

## 2022-12-29 DIAGNOSIS — K5900 Constipation, unspecified: Secondary | ICD-10-CM | POA: Diagnosis not present

## 2022-12-29 DIAGNOSIS — N281 Cyst of kidney, acquired: Secondary | ICD-10-CM | POA: Diagnosis not present

## 2022-12-29 DIAGNOSIS — M8458XA Pathological fracture in neoplastic disease, other specified site, initial encounter for fracture: Secondary | ICD-10-CM

## 2022-12-29 DIAGNOSIS — S22000A Wedge compression fracture of unspecified thoracic vertebra, initial encounter for closed fracture: Secondary | ICD-10-CM | POA: Diagnosis not present

## 2022-12-29 DIAGNOSIS — Z87891 Personal history of nicotine dependence: Secondary | ICD-10-CM | POA: Diagnosis not present

## 2022-12-29 DIAGNOSIS — M545 Low back pain, unspecified: Secondary | ICD-10-CM | POA: Diagnosis not present

## 2022-12-29 DIAGNOSIS — G893 Neoplasm related pain (acute) (chronic): Secondary | ICD-10-CM | POA: Diagnosis not present

## 2022-12-29 DIAGNOSIS — N200 Calculus of kidney: Secondary | ICD-10-CM | POA: Diagnosis not present

## 2022-12-29 LAB — CBC WITH DIFFERENTIAL/PLATELET
Abs Immature Granulocytes: 0.03 10*3/uL (ref 0.00–0.07)
Basophils Absolute: 0.1 10*3/uL (ref 0.0–0.1)
Basophils Relative: 1 %
Eosinophils Absolute: 0.3 10*3/uL (ref 0.0–0.5)
Eosinophils Relative: 3 %
HCT: 39.4 % (ref 36.0–46.0)
Hemoglobin: 12.6 g/dL (ref 12.0–15.0)
Immature Granulocytes: 0 %
Lymphocytes Relative: 21 %
Lymphs Abs: 1.9 10*3/uL (ref 0.7–4.0)
MCH: 28.3 pg (ref 26.0–34.0)
MCHC: 32 g/dL (ref 30.0–36.0)
MCV: 88.5 fL (ref 80.0–100.0)
Monocytes Absolute: 0.8 10*3/uL (ref 0.1–1.0)
Monocytes Relative: 9 %
Neutro Abs: 6 10*3/uL (ref 1.7–7.7)
Neutrophils Relative %: 66 %
Platelets: 314 10*3/uL (ref 150–400)
RBC: 4.45 MIL/uL (ref 3.87–5.11)
RDW: 13.9 % (ref 11.5–15.5)
WBC: 9 10*3/uL (ref 4.0–10.5)
nRBC: 0 % (ref 0.0–0.2)

## 2022-12-29 LAB — URINALYSIS, ROUTINE W REFLEX MICROSCOPIC
Bilirubin Urine: NEGATIVE
Glucose, UA: NEGATIVE mg/dL
Hgb urine dipstick: NEGATIVE
Ketones, ur: NEGATIVE mg/dL
Nitrite: NEGATIVE
Protein, ur: 30 mg/dL — AB
Specific Gravity, Urine: 1.028 (ref 1.005–1.030)
pH: 5 (ref 5.0–8.0)

## 2022-12-29 LAB — LIPASE, BLOOD: Lipase: 26 U/L (ref 11–51)

## 2022-12-29 LAB — COMPREHENSIVE METABOLIC PANEL
ALT: 25 U/L (ref 0–44)
AST: 38 U/L (ref 15–41)
Albumin: 4.2 g/dL (ref 3.5–5.0)
Alkaline Phosphatase: 345 U/L — ABNORMAL HIGH (ref 38–126)
Anion gap: 11 (ref 5–15)
BUN: 12 mg/dL (ref 6–20)
CO2: 23 mmol/L (ref 22–32)
Calcium: 9.9 mg/dL (ref 8.9–10.3)
Chloride: 101 mmol/L (ref 98–111)
Creatinine, Ser: 0.84 mg/dL (ref 0.44–1.00)
GFR, Estimated: >60 mL/min (ref >60)
Glucose, Bld: 116 mg/dL — ABNORMAL HIGH (ref 70–99)
Potassium: 3.4 mmol/L — ABNORMAL LOW (ref 3.5–5.1)
Sodium: 135 mmol/L (ref 135–145)
Total Bilirubin: 0.6 mg/dL (ref 0.3–1.2)
Total Protein: 8.4 g/dL — ABNORMAL HIGH (ref 6.5–8.1)

## 2022-12-29 LAB — I-STAT BETA HCG BLOOD, ED (MC, WL, AP ONLY): I-stat hCG, quantitative: <5 m[IU]/mL (ref <5)

## 2022-12-29 LAB — LACTIC ACID, PLASMA
Lactic Acid, Venous: 1 mmol/L (ref 0.5–1.9)
Lactic Acid, Venous: 1.3 mmol/L (ref 0.5–1.9)

## 2022-12-29 LAB — PREGNANCY, URINE: Preg Test, Ur: NEGATIVE

## 2022-12-29 MED ORDER — HYDROMORPHONE HCL 1 MG/ML IJ SOLN
1.0000 mg | Freq: Once | INTRAMUSCULAR | Status: AC
  Administered 2022-12-29: 1 mg via INTRAVENOUS
  Filled 2022-12-29: qty 1

## 2022-12-29 MED ORDER — PIPERACILLIN-TAZOBACTAM 3.375 G IVPB 30 MIN
3.3750 g | Freq: Once | INTRAVENOUS | Status: AC
  Administered 2022-12-29: 3.375 g via INTRAVENOUS
  Filled 2022-12-29: qty 50

## 2022-12-29 MED ORDER — DIAZEPAM 5 MG/ML IJ SOLN
2.5000 mg | Freq: Once | INTRAMUSCULAR | Status: AC
  Administered 2022-12-29: 2.5 mg via INTRAVENOUS
  Filled 2022-12-29: qty 2

## 2022-12-29 MED ORDER — MORPHINE SULFATE (PF) 4 MG/ML IV SOLN
4.0000 mg | Freq: Once | INTRAVENOUS | Status: AC
  Administered 2022-12-29: 4 mg via INTRAVENOUS
  Filled 2022-12-29: qty 1

## 2022-12-29 MED ORDER — PREDNISONE 50 MG PO TABS
50.0000 mg | ORAL_TABLET | Freq: Four times a day (QID) | ORAL | Status: DC
  Filled 2022-12-29: qty 1

## 2022-12-29 MED ORDER — IOHEXOL 300 MG/ML  SOLN
100.0000 mL | Freq: Once | INTRAMUSCULAR | Status: AC | PRN
  Administered 2022-12-29: 100 mL via INTRAVENOUS

## 2022-12-29 MED ORDER — DIPHENHYDRAMINE HCL 25 MG PO CAPS: 50.0000 mg | ORAL_CAPSULE | Freq: Once | ORAL | Status: DC

## 2022-12-29 MED ORDER — DIPHENHYDRAMINE HCL 50 MG/ML IJ SOLN: 50.0000 mg | Freq: Once | INTRAMUSCULAR | Status: DC

## 2022-12-29 MED ORDER — GADOBUTROL 1 MMOL/ML IV SOLN
7.5000 mL | Freq: Once | INTRAVENOUS | Status: AC | PRN
  Administered 2022-12-29: 7.5 mL via INTRAVENOUS

## 2022-12-29 MED ORDER — SODIUM CHLORIDE 0.9 % IV BOLUS
1000.0000 mL | Freq: Once | INTRAVENOUS | Status: AC
  Administered 2022-12-29: 1000 mL via INTRAVENOUS

## 2022-12-29 NOTE — ED Triage Notes (Addendum)
Pt reports Friday was getting out of shower and arched back to wrap towel around self and heard 3 pops and immediatly had pain with cramping to right lower back.  Pain radiated into right mid abd Saturday night with distention.  LBM:4 days ago.  Hx breast cancer w/ mets Percocet @ 0800

## 2022-12-29 NOTE — ED Provider Notes (Signed)
EMERGENCY DEPARTMENT AT [facility] Provider Note   CSN: [id number] Arrival date & time: 12/29/22  1412     History  Chief Complaint  Patient presents with   Back Pain   Abdominal Pain   Constipation    Natalie Griffin is a 47 y.o. female history of breast cancer with mets to the bone, here presenting with right flank pain and back pain.  Patient states that she does not want to go chemotherapy for her metastatic breast cancer.  Patient states that about 2 days ago she had an episode of urinary incontinence and also had a fever at that time.  Patient states that her urinary incontinence and fever has resolved.  Patient states that she also has right-sided back pain which she thought was muscle spasms.  Patient denies any chest pain or shortness of breath.  Patient is on morphine at home but has not helped with her pain.  The history is provided by the patient.       Home Medications Prior to Admission medications   Medication Sig Start Date End Date Taking? Authorizing Provider  ALPRAZolam Prudy Feeler) 0.5 MG tablet Take 1 tablet (0.5 mg total) by mouth 3 (three) times daily as needed for anxiety. 08/25/22   Serena Croissant, MD  anastrozole (ARIMIDEX) 1 MG tablet Take 1 tablet (1 mg total) by mouth daily. 09/24/22   Serena Croissant, MD  calcium carbonate (OS-CAL) 600 MG TABS tablet Take 600 mg by mouth in the morning and at bedtime.    [provider]  ferrous sulfate 324 MG TBEC Take 324 mg by mouth daily in the afternoon.    [provider]  morphine (MS CONTIN) 30 MG 12 hr tablet Take 1 tablet (30 mg total) by mouth every 12 (twelve) hours. 12/21/22   Serena Croissant, MD  Multiple Vitamins-Minerals (MULTIVITAMIN WITH MINERALS) tablet Take 1 tablet by mouth in the morning.    [provider]  oxyCODONE-acetaminophen (PERCOCET) 10-325 MG tablet Take 1 tablet by mouth every 8 (eight) hours as needed for pain. 12/21/22   Serena Croissant, MD  predniSONE  (DELTASONE) 50 MG tablet 1 tablet 13 hrs prior, 1 tablet 7 hours prior and 1 tablet 1 hr prior to scan. 10/27/22   Serena Croissant, MD  Vitamin D, Cholecalciferol, 25 MCG (1000 UT) TABS Take 1 tablet by mouth daily. 11/11/22   Serena Croissant, MD      Allergies    Adhesive [tape], Fentanyl, Phesgo [pertuz-trastuz-hyaluron-[id number]], Tetanus toxoid, and Succinylcholine    Review of Systems   Review of Systems  Gastrointestinal:  Positive for abdominal pain and constipation.  Musculoskeletal:  Positive for back pain.  All other systems reviewed and are negative.   Physical Exam Updated Vital Signs BP 110/65   Pulse 69   Temp 98.2 F (36.8 C) (Oral)   Resp 18   Wt 79 kg   SpO2 95%   BMI 28.98 kg/m  Physical Exam Vitals and nursing note reviewed.  Constitutional:      Comments: Chronically ill   HENT:     Head: Normocephalic.  Eyes:     Extraocular Movements: Extraocular movements intact.     Pupils: Pupils are equal, round, and reactive to light.  Cardiovascular:     Rate and Rhythm: Regular rhythm. Tachycardia present.  Pulmonary:     Effort: Pulmonary effort is normal.     Breath sounds: Normal breath sounds.  Abdominal:     General: Abdomen is flat.  Comments: Mild R paralumbar tenderness   Skin:    General: Skin is warm.     Capillary Refill: Capillary refill takes less than 2 seconds.  Neurological:     General: No focal deficit present.     Mental Status: She is oriented to person, place, and time.  Psychiatric:        Mood and Affect: Mood normal.        Behavior: Behavior normal.     ED Results / Procedures / Treatments   Labs (all labs ordered are listed, but only abnormal results are displayed) Labs Reviewed  COMPREHENSIVE METABOLIC PANEL - Abnormal; Notable for the following components:      Result Value   Potassium 3.4 (*)    Glucose, Bld 116 (*)    Total Protein 8.4 (*)    Alkaline Phosphatase 345 (*)    All other components within normal limits   URINALYSIS, ROUTINE W REFLEX MICROSCOPIC - Abnormal; Notable for the following components:   APPearance HAZY (*)    Protein, ur 30 (*)    Leukocytes,Ua SMALL (*)    Bacteria, UA RARE (*)    All other components within normal limits  CULTURE, BLOOD (ROUTINE X 2)  CULTURE, BLOOD (ROUTINE X 2)  CBC WITH DIFFERENTIAL/PLATELET  LIPASE, BLOOD  PREGNANCY, URINE  LACTIC ACID, PLASMA  LACTIC ACID, PLASMA  I-STAT BETA HCG BLOOD, ED ([facility], [facility], [facility] ONLY)  I-STAT CHEM 8, ED    EKG EKG Interpretation  Date/Time:  Tuesday December 29 2022 14:26:09 EDT Ventricular Rate:  120 PR Interval:  128 QRS Duration: 81 QT Interval:  296 QTC Calculation: 419 R Axis:   61 Text Interpretation: Sinus tachycardia Consider right atrial enlargement Confirmed by Alona Bene [phone]) on 12/29/2022 2:45:26 PM  Radiology MR Lumbar Spine W Wo Contrast  Result Date: 12/29/2022 CLINICAL DATA:  Initial evaluation for back pain, history of metastatic disease. Evaluate for infection. EXAM: MRI LUMBAR SPINE WITHOUT AND WITH CONTRAST TECHNIQUE: Multiplanar and multiecho pulse sequences of the lumbar spine were obtained without and with intravenous contrast. CONTRAST:  7.89mL GADAVIST GADOBUTROL 1 MMOL/ML IV SOLN COMPARISON:  Prior radiograph from 07/16/2022. FINDINGS: Segmentation: Standard. Lowest well-formed disc space labeled the L5-S1 level. Alignment: Physiologic with preservation of the normal lumbar lordosis. No listhesis. Vertebrae: Widespread osseous metastatic disease seen throughout the lumbar spine and visualized sacrum/pelvis. There is involvement of essentially all levels. Associated pathologic compression fractures involving the superior endplates of T12, L2, and L4 noted. Abnormal posterior convex bowing of the vertebral body cortex seen at L1 and L4. No significant extra osseous or epidural extension of tumor at this time. No MRI evidence for superimposed infection. Conus medullaris and cauda equina: Conus extends to  the T12 level. Conus and cauda equina appear normal. No abnormal enhancement. Paraspinal and other soft tissues: Mild edema within the lower posterior paraspinous musculature, nonspecific, but could reflect a degree of muscular injury/strain. This is not felt to be related to infection given overall appearance. No other paraspinous inflammatory changes. No collections. Disc levels: L1-2: Disc desiccation without significant disc bulge. No stenosis. L2-3:  Unremarkable. L3-4: Degenerative intervertebral disc space narrowing with disc desiccation and mild disc bulge. Mild facet spurring. No significant spinal stenosis. Foramina remain patent. L4-5: Negative interspace. Mild facet hypertrophy. No significant spinal stenosis. Foramina remain patent. L5-S1: Negative interspace. No significant canal or foraminal stenosis. IMPRESSION: 1. Widespread osseous metastatic disease throughout the lumbar spine and visualized sacrum/pelvis. No significant extra osseous or epidural extension  of tumor at this time. No MRI evidence for superimposed infection. 2. Associated pathologic compression fractures involving the superior endplates of T12, L2, and L4. 3. Mild edema within the lower posterior paraspinous musculature, nonspecific, but could reflect muscular injury and/or strain. 4. Underlying mild for age spondylosis without significant stenosis or overt neural impingement. Electronically Signed   By: Rise Mu M.D.   On: 12/29/2022 22:35   MR THORACIC SPINE W WO CONTRAST  Result Date: 12/29/2022 CLINICAL DATA:  Initial evaluation for thoracic osteomyelitis. EXAM: MRI THORACIC WITHOUT AND WITH CONTRAST TECHNIQUE: Multiplanar and multiecho pulse sequences of the thoracic spine were obtained without and with intravenous contrast. CONTRAST:  7.73mL GADAVIST GADOBUTROL 1 MMOL/ML IV SOLN COMPARISON:  Prior study from 02/03/2022. FINDINGS: Alignment: Vertebral bodies normally aligned with preservation of the normal thoracic  kyphosis. No listhesis. Vertebrae: Widespread osseous metastatic disease seen throughout the thoracic spine. There is involvement of essentially all levels, although involvement is most pronounced at T8 through T12. Pathologic compression fractures involving the T4, T5, T10, and T12 vertebral bodies noted height loss at T9 and T10 is mildly progressed as compared to previous MRI from 02/03/2022. Abnormal paraspinous edema and enhancement seen along the anterior margin of the spinal column at T9 through T11-12 (series 26, image 7). While this finding could reflect extra osseous extension of tumor, superimposed infection with osteomyelitis and paraspinous phlegmonous change could be considered in the correct clinical setting. No loculated collections. Mild smooth epidural enhancement seen extending from T9 through T11 as well, possibly reactive. No other epidural involvement of tumor. No other paraspinous inflammatory changes. Cord: Normal signal and morphology. Mild smooth epidural enhancement at T9 through T11-12. No other abnormal enhancement or significant epidural involvement. Paraspinal and other soft tissues: Paraspinous edema adjacent to the T9 through T11 12 levels as above. Paraspinous soft tissues demonstrate no other acute finding. Trace layering bilateral pleural effusions. Disc levels: Underlying mild for age spondylosis. No significant disc bulge or focal disc herniation. No spinal stenosis. Foramina remain patent. IMPRESSION: 1. Widespread osseous metastatic disease throughout the thoracic spine. Pathologic compression fractures involving the T4, T5, T10, and T12 vertebral bodies. Mildly progressive height loss seen about the T10 fracture as compared to previous MRI from 02/03/2022. 2. Abnormal paraspinous edema and enhancement along the anterior margin of the spinal column at T9 through T11-12. Finding is nonspecific, and could reflect extra osseous extension of tumor. Superimposed infection with  osteomyelitis and paraspinous phlegmonous change difficult to exclude, and could be considered in the correct clinical setting. No loculated collections. 3. Mild smooth epidural enhancement extending from T9 through T11, possibly reactive. No other epidural involvement of tumor. 4. Trace layering bilateral pleural effusions. Electronically Signed   By: Rise Mu M.D.   On: 12/29/2022 22:24   CT CHEST ABDOMEN PELVIS W CONTRAST  Result Date: 12/29/2022 CLINICAL DATA:  History of metastatic breast cancer presenting with back pain. EXAM: CT CHEST, ABDOMEN, AND PELVIS WITH CONTRAST TECHNIQUE: Multidetector CT imaging of the chest, abdomen and pelvis was performed following the standard protocol during bolus administration of intravenous contrast. RADIATION DOSE REDUCTION: This exam was performed according to the departmental dose-optimization program which includes automated exposure control, adjustment of the mA and/or kV according to patient size and/or use of iterative reconstruction technique. CONTRAST:  OMNIPAQUE IOHEXOL 300 MG/ML  SOLN COMPARISON:  November 18, 2022 FINDINGS: CT CHEST FINDINGS Cardiovascular: No significant vascular findings. Normal heart size. No pericardial effusion. Mediastinum/Nodes: No enlarged mediastinal, hilar, or axillary  lymph nodes. Thyroid gland, trachea, and esophagus demonstrate no significant findings. Lungs/Pleura: Mild anteromedial right upper lobe and bilateral lower lobe linear atelectasis is seen. There is no evidence of acute infiltrate, pleural effusion or pneumothorax. Musculoskeletal: A stable 2.0 cm x 1.7 cm soft tissue mass is seen within the upper outer quadrant of the right breast. An associated biopsy clip is noted. Innumerable lytic and sclerotic lesions are seen throughout the visualized osseous structures. A mild, acute compression fracture deformity is seen involving the T10 vertebral body. Associated mild to moderate severity paravertebral soft  tissue swelling is noted (axial CT images 46 through 52, CT series 2). This represents a new finding when compared to the prior exam. CT ABDOMEN PELVIS FINDINGS Hepatobiliary: There is diffuse fatty infiltration of the liver parenchyma. No focal liver abnormality is seen. No gallstones, gallbladder wall thickening, or biliary dilatation. Pancreas: Unremarkable. No pancreatic ductal dilatation or surrounding inflammatory changes. Spleen: Normal in size without focal abnormality. Adrenals/Urinary Tract: Adrenal glands are unremarkable. Kidneys are normal in size, without renal calculi or hydronephrosis. A 2 mm nonobstructing renal calculus is seen within the mid left kidney. A 9 mm diameter simple cyst is seen within the anterior aspect of the mid to upper right kidney. Bladder is unremarkable. Stomach/Bowel: There is a small hiatal hernia. The appendix is not identified. No evidence of bowel wall thickening, distention, or inflammatory changes. Vascular/Lymphatic: No significant vascular findings are present. No enlarged abdominal or pelvic lymph nodes. Reproductive: A 2.5 cm diameter ill-defined uterine fibroid suspected within the uterine fundus. The bilateral adnexa are unremarkable. Other: No abdominal wall hernia or abnormality. No abdominopelvic ascites. Musculoskeletal: A metallic density intramedullary rod and compression screw device are seen within the proximal right femur. Innumerable lytic and sclerotic lesions are again seen throughout the spine and pelvis. IMPRESSION: 1. Acute compression fracture deformity of the T10 vertebral body. MRI correlation is recommended. 2. Innumerable lytic and sclerotic lesions throughout the visualized osseous structures, consistent with osseous metastasis. 3. Stable right breast soft tissue mass, as described above. Correlation with biopsy results is recommended. 4. Hepatic steatosis. 5. 2 mm nonobstructing left renal calculus. 6. Small hiatal hernia. 7. Prior open  reduction and internal fixation of the proximal right femur. Electronically Signed   By: Aram Candelahaddeus  Houston M.D.   On: 12/29/2022 20:08    Procedures Procedures    CRITICAL CARE Performed by: Richardean Canalavid H Ata Pecha   Total critical care time: 47 minutes  Critical care time was exclusive of separately billable procedures and treating other patients.  Critical care was necessary to treat or prevent imminent or life-threatening deterioration.  Critical care was time spent personally by me on the following activities: development of treatment plan with patient and/or surrogate as well as nursing, discussions with consultants, evaluation of patient's response to treatment, examination of patient, obtaining history from patient or surrogate, ordering and performing treatments and interventions, ordering and review of laboratory studies, ordering and review of radiographic studies, pulse oximetry and re-evaluation of patient's condition.  Angiocath insertion Performed by: Richardean Canalavid H Kamille Toomey  Consent: Verbal consent obtained. Risks and benefits: risks, benefits and alternatives were discussed Time out: Immediately prior to procedure a "time out" was called to verify the correct patient, procedure, equipment, support staff and site/side marked as required.  Preparation: Patient was prepped and draped in the usual sterile fashion.  Vein Location: R antecube   Ultrasound Guided  Gauge: 20 long   Normal blood return and flush without difficulty Patient tolerance: Patient  tolerated the procedure well with no immediate complications.    Medications Ordered in ED Medications  piperacillin-tazobactam (ZOSYN) IVPB 3.375 g (has no administration in time range)  morphine (PF) 4 MG/ML injection 4 mg (4 mg Intravenous Given 12/29/22 1653)  diazepam (VALIUM) injection 2.5 mg (2.5 mg Intravenous Given 12/29/22 1653)  sodium chloride 0.9 % bolus 1,000 mL (0 mLs Intravenous Stopped 12/29/22 1815)  morphine (PF) 4 MG/ML  injection 4 mg (4 mg Intravenous Given 12/29/22 1833)  iohexol (OMNIPAQUE) 300 MG/ML solution 100 mL (100 mLs Intravenous Contrast Given 12/29/22 1906)  HYDROmorphone (DILAUDID) injection 1 mg (1 mg Intravenous Given 12/29/22 2035)  gadobutrol (GADAVIST) 1 MMOL/ML injection 7.5 mL (7.5 mLs Intravenous Contrast Given 12/29/22 2117)    ED Course/ Medical Decision Making/ A&P                             Medical Decision Making Clotilda A Hoye is a 47 y.o. female here with fever, back pain, urinary incontinence. Consider UTI vs epidural abscess vs osteomyelitis vs worsening mets from breast cancer.  Plan to get CBC and CMP and lactate and cultures and UA and CT chest abdomen pelvis and MRI of the thoracic and lumbar spine.  10:50 PM I reviewed patient's labs and independently interpreted imaging studies.  Patient's white blood cell count is normal and lactate is normal.  Blood cultures were sent.  Patient has widespread metastatic disease in the thoracic or lumbar area.  Patient does have pathologic fractures involving T4-T5 and T10.  Patient also has abnormal paraspinous edema and enhancement of T9-T12.  I discussed case with Esperanza Richters, NP from neurosurgery working with Dr. Jake Samples.  She states that she wants IV antibiotics and transfer to [facility] so neurosurgery can follow.  She also wants IR to evaluate patient tomorrow for possible epidural sampling.  At this point, hospitalist to admit.   Problems Addressed: Acute osteomyelitis of thoracic spine: acute illness or injury Malignant neoplasm of female breast, unspecified estrogen receptor status, unspecified laterality, unspecified site of breast: chronic illness or injury Pathological fracture of vertebra due to neoplastic disease, initial encounter: acute illness or injury  Amount and/or Complexity of Data Reviewed Labs: ordered. Radiology: ordered and independent interpretation performed. Decision-making details documented in ED Course. ECG/medicine  tests: ordered and independent interpretation performed. Decision-making details documented in ED Course.  Risk Prescription drug management. Decision regarding hospitalization.    Final Clinical Impression(s) / ED Diagnoses Final diagnoses:  None    Rx / DC Orders ED Discharge Orders     None         Charlynne Pander, MD 12/29/22 2322

## 2022-12-29 NOTE — Assessment & Plan Note (Signed)
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  Patient refused adjuvant chemo, adjuvant radiation and adjuvant antiestrogen therapy.   Patient went to orthopedics for right hip pain.  Imaging revealed bone metastasis.  CT CAP: Cortical destruction of the right femoral neck consistent with skeletal metastases at risk for pathologic fracture.  Lucent lesion in the left iliac bone and L3 vertebral body consistent with multifocal skeletal metastases. 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   CT chest abdomen pelvis 05/07/2022: No new or progressive bone metastases.  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

## 2022-12-29 NOTE — Progress Notes (Signed)
Received mychart message from pt stating she is in the ED for further evaluation of back pain. Pt scheduled telephone visit with MD today.   Appt pushed out to later this week due to hospitalization.  Pt notified.

## 2022-12-29 NOTE — ED Notes (Signed)
Pt refused nurse to attempt for IV stating "only people that will stick me is IV team" consult placed

## 2022-12-29 NOTE — H&P (Signed)
History and Physical    Patient: Natalie Griffin JYN:[id number] DOB: 01-16-1976 DOA: 12/29/2022 DOS: the patient was seen and examined on 12/29/2022 PCP: Charlane FerrettiSkakle, [place], DO  Patient coming from: Home  Chief Complaint:  Chief Complaint  Patient presents with   Back Pain   Abdominal Pain   Constipation   HPI: Natalie Griffin is a 47 y.o. 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  Patient apparently opted not to go to chemotherapy.  She started having fever and urinary incontinence about 2 days ago.  This has resolved for now but still continues to have significant back pain especially on the right side.  She thought it was muscle spasm so she came to the ER for evaluation.  In the ER patient had extensive workup.  Initially CT of the abdomen and pelvis showed T12 compression fracture.  Subsequently MRI of the lumbar spine showed compression fractures of the T12, L2 and L4.  No obvious abscess but multiple diffuse metastatic disease.  MRI of the thoracic spine showed pathologic fractures involving multiple levels and colluding T4-T5 T10 and T12 vertebral bodies.  The T10 compression fractures has height loss.  There is also an abnormal paraspinous edema and enhancement along the anterior margin of the spinal column at T9 32E 11-12.  Possibly extraosseous extension of tumor.  Patient has superimposed infection with osteomyelitis and the paraspinous phlegmonous change suspected in the area.  But no loculated effusion.  There was trace layering of bilateral pleural effusions.  Case discussed with neurosurgery and infectious disease.  Recommendation is to [facility] where they will have workup by IR infectious disease.  Review of Systems: As mentioned in the history of present  illness. All other systems reviewed and are negative. Past Medical History:  Diagnosis Date   Anemia    Anxiety    Bone metastasis    Breast cancer 2021   Left breast invasive ductal carcinoma   Cancer 2021   Left breast   Family history of adverse reaction to anesthesia    Father and Aunt have pseudocholinesterase deficiency - Trouble waking up from anesthesia   History of hiatal hernia    History of kidney stones    noted on CT Left nonobstructive   Past Surgical History:  Procedure Laterality Date   BREAST BIOPSY Right 07/21/2022   US RT BREAST BX W LOC DEV 1ST LESION IMG BX SPEC US GUIDE 07/21/2022 [facility]   BREAST LUMPECTOMY WITH RADIOACTIVE SEED AND SENTINEL LYMPH NODE BIOPSY Left 06/04/2020   Procedure: LEFT BREAST LUMPECTOMY WITH RADIOACTIVE SEED AND SENTINEL LYMPH NODE MAPPING;  Surgeon: Harriette Bouillonornett, Thomas, MD;  Location: [facility] OR;  Service: General;  Laterality: Left;  PEC BLOCK, RNFA   FEMUR IM NAIL Right 04/01/2021   Procedure: INTRAMEDULLARY (IM) NAIL FEMORAL;  Surgeon: Sheral ApleyMurphy, Timothy D, MD;  Location: [facility];  Service: Orthopedics;  Laterality: Right;   ROBOTIC ASSISTED BILATERAL SALPINGO OOPHERECTOMY Bilateral 09/30/2022   Procedure: XI ROBOTIC ASSISTED BILATERAL SALPINGO OOPHORECTOMY;  Surgeon: Carver Filaucker, Katherine R, MD;  Location: [facility];  Service: Gynecology;  Laterality: Bilateral;   WISDOM TOOTH EXTRACTION     Social History:  reports that she quit smoking about 2 years ago. Her smoking use included cigarettes. She has never used smokeless tobacco. She reports that she  does not currently use alcohol. She reports that she does not use drugs.  Allergies  Allergen Reactions   Adhesive [Tape] Other (See Comments)    Blistering with steri strips   Fentanyl Itching, Nausea Only and Rash    08/11/2022 reports removing patch two days ago. Experienced "rash, itching, nausea, headache and breathing" affected.      Phesgo [Pertuz-Trastuz-Hyaluron-Zzxf] Diarrhea and Rash     Phesgo side effect: Fixed drug eruption, profound diarrhea Therefore we will discontinue Phesgo and start her on Herceptin Hylecta Inj   Tetanus Toxoid Anaphylaxis   Succinylcholine Other (See Comments)    Her Body does not have enough enzymes to carry it out of her body    Family History  Problem Relation Age of Onset   Colon cancer Neg Hx    Esophageal cancer Neg Hx    Stomach cancer Neg Hx    Rectal cancer Neg Hx     Prior to Admission medications   Medication Sig Start Date End Date Taking? Authorizing Provider  ALPRAZolam Prudy Feeler) 0.5 MG tablet Take 1 tablet (0.5 mg total) by mouth 3 (three) times daily as needed for anxiety. 08/25/22   Serena Croissant, MD  anastrozole (ARIMIDEX) 1 MG tablet Take 1 tablet (1 mg total) by mouth daily. 09/24/22   Serena Croissant, MD  calcium carbonate (OS-CAL) 600 MG TABS tablet Take 600 mg by mouth in the morning and at bedtime.    [provider]  ferrous sulfate 324 MG TBEC Take 324 mg by mouth daily in the afternoon.    [provider]  morphine (MS CONTIN) 30 MG 12 hr tablet Take 1 tablet (30 mg total) by mouth every 12 (twelve) hours. 12/21/22   Serena Croissant, MD  Multiple Vitamins-Minerals (MULTIVITAMIN WITH MINERALS) tablet Take 1 tablet by mouth in the morning.    [provider]  oxyCODONE-acetaminophen (PERCOCET) 10-325 MG tablet Take 1 tablet by mouth every 8 (eight) hours as needed for pain. 12/21/22   Serena Croissant, MD  predniSONE (DELTASONE) 50 MG tablet 1 tablet 13 hrs prior, 1 tablet 7 hours prior and 1 tablet 1 hr prior to scan. 10/27/22   Serena Croissant, MD  Vitamin D, Cholecalciferol, 25 MCG (1000 UT) TABS Take 1 tablet by mouth daily. 11/11/22   Serena Croissant, MD    Physical Exam: Vitals:   12/29/22 1830 12/29/22 1958 12/29/22 2000 12/29/22 2030  BP:  123/81 126/84 110/65  Pulse: 70 77 71 69  Resp: 13 18 12 18   Temp:  98.2 F (36.8 C)    TempSrc:  Oral    SpO2: 98% 96% 95% 95%  Weight:        Constitutional: Acutely ill looking, mildly dehydrated NAD, calm, comfortable Eyes: PERRL, lids and conjunctivae normal ENMT: Mucous membranes are dry. Posterior pharynx clear of any exudate or lesions.Normal dentition.  Neck: normal, supple, no masses, no thyromegaly Respiratory: clear to auscultation bilaterally, no wheezing, no crackles. Normal respiratory effort. No accessory muscle use.  Cardiovascular: Regular rate and rhythm, no murmurs / rubs / gallops. No extremity edema. 2+ pedal pulses. No carotid bruits.  Abdomen: no tenderness, no masses palpated. No hepatosplenomegaly. Bowel sounds positive.  Musculoskeletal: Good range of motion, no joint swelling or tenderness, Skin: no rashes, lesions, ulcers. No induration Neurologic: CN 2-12 grossly intact. Sensation intact, DTR normal. Strength 5/5 in all 4.  Psychiatric: Normal judgment and insight. Alert and oriented x 3. 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  At this point she is having multiple compression fractures and possible spinal infection.  We will transfer her to [facility] where she will be seen by neurosurgery and infectious disease for further evaluation and treatment.  Pain management mainly.  #2 multiple compression fractures of the spine: Suspected as a result of pathologic fractures from metastatic disease.  Again will continue management as above  #3 hypokalemia: Continue to replete potassium.  #4 suspected spinal abscess: IV antibiotics.  #5 anxiety disorder: Continue home anxiolytics.   Advance Care Planning:   Code Status: DNR   Consults: Neurosurgery Dr. Esperanza Richters and infectious disease  Family Communication: No family at bedside  Severity of Illness: The appropriate patient status for this patient is INPATIENT. Inpatient status is judged to be  reasonable and necessary in order to provide the required intensity of service to ensure the patient's safety. The patient's presenting symptoms, physical exam findings, and initial radiographic and laboratory data in the context of their chronic comorbidities is felt to place them at high risk for further clinical deterioration. Furthermore, it is not anticipated that the patient will be medically stable for discharge from the hospital within 2 midnights of admission.   * I certify that at the point of admission it is my clinical judgment that the patient will require inpatient hospital care spanning beyond 2 midnights from the point of admission due to high intensity of service, high risk for further deterioration and high frequency of surveillance required.*  AuthorLonia Blood, MD 12/29/2022 11:32 PM  For on call review [email or web address].

## 2022-12-29 NOTE — ED Provider Triage Note (Signed)
Emergency Medicine Provider Triage Evaluation Note  Natalie Griffin , a 47 y.o. female  was evaluated in triage.  Pt complains of right-sided back pain for the past 4 days.  Patient states she has breast cancer not undergoing chemo or radiation and has mets everywhere.  Patient states that 2 days ago she had an episode of urinary incontinence and has patient uses on her right toes between her second and third digits and notes weakness when she walks but has not fallen.  Patient states her right back pain wraps around her flank to the right side of her abdomen in the upper and lower region.  Patient states she has had a temperature 2 days ago of 16 F that is since resolved.  Patient denied chest pain, nausea/vomiting, vision changes  Review of Systems  Positive: See HPI Negative: See HPI  Physical Exam  BP (!) 155/93 (BP Location: Right Arm)   Pulse (!) 117   Temp 97.9 F (36.6 C) (Oral)   Resp 14   Wt 79 kg   SpO2 98%   BMI 28.98 kg/m  Gen:   Awake, no distress   Resp:  Normal effort  MSK:   Moves extremities without difficulty  Other:  Lower abdominal tenderness bilaterally, no peritoneal signs noted, right parathoracic tenderness, no midline tenderness or abnormalities palpated  Medical Decision Making  Medically screening exam initiated at 3:24 PM.  Appropriate orders placed.  [name] was informed that the remainder of the evaluation will be completed by another provider, this initial triage assessment does not replace that evaluation, and the importance of remaining in the ED until their evaluation is complete.  Workup initiated, patient is tachycardic at 117 but stable, patient verbalized that she does not have a contrast allergy and that she has had Cts in the past without the pretreatment any done fine, patient stable at this time   Remi Deter 12/29/22 1537

## 2022-12-30 DIAGNOSIS — Z7189 Other specified counseling: Secondary | ICD-10-CM

## 2022-12-30 DIAGNOSIS — Z515 Encounter for palliative care: Secondary | ICD-10-CM | POA: Diagnosis not present

## 2022-12-30 DIAGNOSIS — M8458XA Pathological fracture in neoplastic disease, other specified site, initial encounter for fracture: Secondary | ICD-10-CM

## 2022-12-30 DIAGNOSIS — M84451A Pathological fracture, right femur, initial encounter for fracture: Secondary | ICD-10-CM

## 2022-12-30 DIAGNOSIS — C50212 Malignant neoplasm of upper-inner quadrant of left female breast: Secondary | ICD-10-CM

## 2022-12-30 DIAGNOSIS — Z170 Estrogen receptor positive status [ER+]: Secondary | ICD-10-CM

## 2022-12-30 DIAGNOSIS — M4620 Osteomyelitis of vertebra, site unspecified: Secondary | ICD-10-CM | POA: Diagnosis not present

## 2022-12-30 DIAGNOSIS — E876 Hypokalemia: Secondary | ICD-10-CM

## 2022-12-30 DIAGNOSIS — S22070A Wedge compression fracture of T9-T10 vertebra, initial encounter for closed fracture: Secondary | ICD-10-CM

## 2022-12-30 DIAGNOSIS — G893 Neoplasm related pain (acute) (chronic): Secondary | ICD-10-CM

## 2022-12-30 MED ORDER — METHOCARBAMOL 750 MG PO TABS
750.0000 mg | ORAL_TABLET | Freq: Three times a day (TID) | ORAL | Status: DC | PRN
  Administered 2022-12-30 – 2022-12-31 (×4): 750 mg via ORAL
  Filled 2022-12-30 (×4): qty 1

## 2022-12-30 MED ORDER — ANASTROZOLE 1 MG PO TABS
1.0000 mg | ORAL_TABLET | Freq: Every day | ORAL | Status: DC
  Filled 2022-12-30 (×2): qty 1

## 2022-12-30 MED ORDER — VANCOMYCIN HCL 1500 MG/300ML IV SOLN
1500.0000 mg | Freq: Once | INTRAVENOUS | Status: DC
  Filled 2022-12-30: qty 300

## 2022-12-30 MED ORDER — DOCUSATE SODIUM 100 MG PO CAPS
100.0000 mg | ORAL_CAPSULE | Freq: Two times a day (BID) | ORAL | Status: DC
  Administered 2022-12-30 – 2022-12-31 (×2): 100 mg via ORAL
  Filled 2022-12-30 (×2): qty 1

## 2022-12-30 MED ORDER — HYDROMORPHONE HCL 1 MG/ML IJ SOLN
1.0000 mg | INTRAMUSCULAR | Status: DC | PRN
  Administered 2022-12-30: 1 mg via INTRAVENOUS
  Filled 2022-12-30: qty 1

## 2022-12-30 MED ORDER — ONDANSETRON HCL 4 MG PO TABS: 4.0000 mg | ORAL_TABLET | Freq: Four times a day (QID) | ORAL | Status: DC | PRN

## 2022-12-30 MED ORDER — METHOCARBAMOL 500 MG PO TABS: 500.0000 mg | ORAL_TABLET | Freq: Four times a day (QID) | ORAL | Status: DC | PRN

## 2022-12-30 MED ORDER — FERROUS SULFATE 325 (65 FE) MG PO TABS: 324.0000 mg | ORAL_TABLET | Freq: Every day | ORAL | Status: DC

## 2022-12-30 MED ORDER — POLYETHYLENE GLYCOL 3350 17 G PO PACK
17.0000 g | PACK | Freq: Two times a day (BID) | ORAL | Status: DC
  Administered 2022-12-30 – 2022-12-31 (×3): 17 g via ORAL
  Filled 2022-12-30 (×3): qty 1

## 2022-12-30 MED ORDER — CALCIUM CARBONATE 1250 (500 CA) MG PO TABS
1250.0000 mg | ORAL_TABLET | Freq: Three times a day (TID) | ORAL | Status: DC
  Filled 2022-12-30 (×6): qty 1

## 2022-12-30 MED ORDER — OXYCODONE-ACETAMINOPHEN 5-325 MG PO TABS
1.0000 | ORAL_TABLET | Freq: Three times a day (TID) | ORAL | Status: DC | PRN
  Administered 2022-12-30 (×3): 1 via ORAL
  Filled 2022-12-30 (×3): qty 1

## 2022-12-30 MED ORDER — ALPRAZOLAM 0.5 MG PO TABS
0.5000 mg | ORAL_TABLET | Freq: Three times a day (TID) | ORAL | Status: DC | PRN
  Administered 2022-12-30: 0.5 mg via ORAL
  Filled 2022-12-30: qty 1

## 2022-12-30 MED ORDER — SODIUM CHLORIDE 0.9 % IV SOLN: 2.0000 g | INTRAVENOUS | Status: DC

## 2022-12-30 MED ORDER — ONDANSETRON HCL 4 MG/2ML IJ SOLN: 4.0000 mg | Freq: Four times a day (QID) | INTRAMUSCULAR | Status: DC | PRN

## 2022-12-30 MED ORDER — PIPERACILLIN-TAZOBACTAM 3.375 G IVPB
3.3750 g | Freq: Three times a day (TID) | INTRAVENOUS | Status: DC
  Filled 2022-12-30: qty 50

## 2022-12-30 MED ORDER — OXYCODONE HCL 5 MG PO TABS
5.0000 mg | ORAL_TABLET | Freq: Three times a day (TID) | ORAL | Status: DC | PRN
  Administered 2022-12-30 (×2): 5 mg via ORAL
  Filled 2022-12-30 (×2): qty 1

## 2022-12-30 MED ORDER — BISACODYL 5 MG PO TBEC
5.0000 mg | DELAYED_RELEASE_TABLET | Freq: Every day | ORAL | Status: DC | PRN
  Administered 2022-12-30 – 2022-12-31 (×2): 5 mg via ORAL
  Filled 2022-12-30 (×2): qty 1

## 2022-12-30 MED ORDER — KCL-LACTATED RINGERS-D5W 20 MEQ/L IV SOLN
INTRAVENOUS | Status: DC
  Filled 2022-12-30 (×2): qty 1000

## 2022-12-30 MED ORDER — OXYCODONE-ACETAMINOPHEN 10-325 MG PO TABS: 1.0000 | ORAL_TABLET | Freq: Three times a day (TID) | ORAL | Status: DC | PRN

## 2022-12-30 MED ORDER — POLYETHYLENE GLYCOL 3350 17 G PO PACK: 17.0000 g | PACK | Freq: Every day | ORAL | Status: DC

## 2022-12-30 MED ORDER — MORPHINE SULFATE ER 15 MG PO TBCR
30.0000 mg | EXTENDED_RELEASE_TABLET | Freq: Two times a day (BID) | ORAL | Status: DC
  Administered 2022-12-30 (×2): 30 mg via ORAL
  Filled 2022-12-30 (×3): qty 2

## 2022-12-30 MED ORDER — VANCOMYCIN HCL IN DEXTROSE 1-5 GM/200ML-% IV SOLN
1000.0000 mg | Freq: Two times a day (BID) | INTRAVENOUS | Status: DC
  Filled 2022-12-30: qty 200

## 2022-12-30 MED ORDER — ENOXAPARIN SODIUM 40 MG/0.4ML IJ SOSY: 40.0000 mg | PREFILLED_SYRINGE | INTRAMUSCULAR | Status: DC

## 2022-12-30 NOTE — Progress Notes (Signed)
Pharmacy Antibiotic Note  Natalie Griffin is a 47 y.o. female admitted on 12/29/2022 with concern for discitis/osteo versus metastatic spread of known breast cancer. Pharmacy has been consulted for Vancomycin dosing along with Rocephin per MD.  SCr 0.84, CrCl~80-90 ml/min.   Plan: - Start Vancomycin 1500 mg IV x 1 followed by 1g IV every 12 hours (eAUC 530, Vd 0.72, SCr 0.84) - Rocephin 2g IV every 24 hours -Will continue to follow renal function, culture results, LOT, and antibiotic de-escalation plans   Weight: 79 kg (174 lb 2.6 oz)  Temp (24hrs), Avg:98.3 F (36.8 C), Min:97.9 F (36.6 C), Max:99.1 F (37.3 C)  Recent Labs  Lab 12/29/22 1652 12/29/22 1821 12/29/22 1955  WBC 9.0  --   --   CREATININE 0.84  --   --   LATICACIDVEN  --  1.0 1.3    Estimated Creatinine Clearance: 86 mL/min (by C-G formula based on SCr of 0.84 mg/dL).    Allergies  Allergen Reactions   Adhesive [Tape] Other (See Comments)    Blistering with steri strips   Fentanyl Itching, Nausea Only and Rash    08/11/2022 reports removing patch two days ago. Experienced "rash, itching, nausea, headache and breathing" affected.      Phesgo [Pertuz-Trastuz-Hyaluron-Zzxf] Diarrhea and Rash    Phesgo side effect: Fixed drug eruption, profound diarrhea Therefore we will discontinue Phesgo and start her on Herceptin Hylecta Inj   Tetanus Toxoid Anaphylaxis   Succinylcholine Other (See Comments)    Her Body does not have enough enzymes to carry it out of her body    Antimicrobials this admission: Zosyn 4/9 x 1 Vancomycin 4/10 >> Rocephin 4/10 >>  Dose adjustments this admission: N/a  Microbiology results: 4/9 BCx >> ng<12h  Thank you for allowing pharmacy to be a part of this patient's care.  Georgina Pillion, PharmD, BCPS Infectious Diseases Clinical Pharmacist 12/30/2022 1:59 PM   **Pharmacist phone directory can now be found on [email or web address] ([email or web address]).  Listed under [facility].

## 2022-12-30 NOTE — Progress Notes (Signed)
PROGRESS NOTE    Natalie Griffin  ZOX:[id number] DOB: 1976/01/17 DOA: 12/29/2022 PCP: Charlane Ferretti, DO    Brief Narrative:  Natalie Griffin is a 47 y.o. female with medical history significant of metastatic breast cancer with extensive osseous metastasis, anxiety disorder, anemia of chronic disease, not on chemotherapy as outpatient presented to hospital with right flank and back pain.   Patient apparently opted not to go to chemotherapy.  Patient also had fever and urinary incontinence.  In the ER, CBC without leukocytosis.  Lipase 26.  Urinalysis with some proteinuria.  Had mild hypokalemia with potassium of 3.4.  Urine pregnancy test was negative.  Lactate was 1.0.  CT of the abdomen and pelvis showed T12 compression fracture. MRI of the lumbar spine showed compression fractures of the T12, L2 and L4.  No obvious abscess but multiple diffuse metastatic disease.  MRI of the thoracic spine showed pathologic fractures involving multiple levels and colluding T4-T5 T10 and T12 vertebral bodies with paraspinous edema and enhancement along the anterior margin of the spinal column.Possibly extraosseous extension of tumor.  Patient has superimposed infection with osteomyelitis and the paraspinous phlegmonous change suspected in the area.  But no loculated effusion.  There was trace layering of bilateral pleural effusions.  Case discussed with neurosurgery and infectious disease.  Patient was then considered for admission to hospital for further evaluation and treatment.  Assessment and Plan:   Metastatic breast cancer to the bones with possible spinal infection:  Now with multiple compression fracture and possible spinal infection.  Neurosurgery and ID have been notified.  Blood cultures negative in less than 12 hours.  Spoke with infectious disease for consultation.  Recommended continuation of antibiotic and changing to Rocephin and vancomycin until cultures are negative.  Continue pain management with  Dilaudid and MS Contin and Percocet.  Of note patient follows up with Dr. Pamelia Hoit oncology as outpatient and has refused chemotherapy as outpatient.  Patient did have palliative radiation to the iliac bone and hip in the past.  Status post oophorectomy 09/30/2022, currently on anastrozole.  Multiple compression fractures of the spine:  Secondary to pathological fracture from metastasis.  Pain management, spoke with neurosurgery at baseline.  Neurosurgery has offered brace which she is thinking about it.  Neurosurgery recommended ID consultation and biopsy but patient has refused biopsy from the area.  Radiology however thinks that the findings could be more related to metastatic tumor.  hypokalemia: Was replenished on 12/29/2022.Marland Kitchen  Recheck today.  anxiety disorder: Continue Xanax.  Goals of care. Patient has intractable pain and metastatic cancer.  Will consider palliative care for goals of care discussion, pain management.     DVT prophylaxis: enoxaparin (LOVENOX) injection 40 mg Start: 12/30/22 2200   Code Status:     Code Status: DNR  Disposition: Home likely in 1 to 2 days  Status is: Inpatient  Remains inpatient appropriate because: Intractable pain, constipation, metastatic breast cancer to the bones.  Family Communication: None at bedside  Consultants:  Neurosurgery Infectious disease Palliative care  Procedures:  None yet  Antimicrobials:  Rocephin and vancomycin  Anti-infectives (From admission, onward)    Start     Dose/Rate Route Frequency Ordered Stop   12/31/22 0500  vancomycin (VANCOCIN) IVPB 1000 mg/200 mL premix        1,000 mg 200 mL/hr over 60 Minutes Intravenous Every 12 hours 12/30/22 1400     12/30/22 1500  vancomycin (VANCOREADY) IVPB 1500 mg/300 mL  1,500 mg 150 mL/hr over 120 Minutes Intravenous  Once 12/30/22 1400     12/30/22 1445  cefTRIAXone (ROCEPHIN) 2 g in sodium chloride 0.9 % 100 mL IVPB        2 g 200 mL/hr over 30 Minutes Intravenous  Every 24 hours 12/30/22 1345     12/30/22 0700  piperacillin-tazobactam (ZOSYN) IVPB 3.375 g  Status:  Discontinued        3.375 g 12.5 mL/hr over 240 Minutes Intravenous Every 8 hours 12/30/22 0606 12/30/22 1345   12/29/22 2300  piperacillin-tazobactam (ZOSYN) IVPB 3.375 g        3.375 g 100 mL/hr over 30 Minutes Intravenous  Once 12/29/22 2254 12/30/22 0000       Subjective: Today, patient was seen and examined at bedside.  Patient complains of back pain and spasms.  Patient denies any nausea, vomiting, fever, chills or rigor.  Complains of constipation for almost a week.  Neurosurgery at bedside.  Objective: Vitals:   12/30/22 0203 12/30/22 0335 12/30/22 0756 12/30/22 1142  BP: 116/79 132/87 111/68 123/79  Pulse: (!) 111 84 79 82  Resp: 18 18 16 18   Temp: 98.6 F (37 C) 98.1 F (36.7 C) 98.5 F (36.9 C) 99.1 F (37.3 C)  TempSrc: Oral Oral Oral Oral  SpO2:  96% 98% 98%  Weight:        Intake/Output Summary (Last 24 hours) at 12/30/2022 1454 Last data filed at 12/30/2022 0930 Gross per 24 hour  Intake 240 ml  Output --  Net 240 ml    Filed Weights   12/29/22 1439  Weight: 79 kg    Physical Examination: Body mass index is 28.98 kg/m.   General:  Average built, not in obvious distress, HENT:   No scleral pallor or icterus noted. Oral mucosa is mildly dry Chest:  Clear breath sounds.   No crackles or wheezes.   CVS: S1 &S2 heard. No murmur.  Regular rate and rhythm. Abdomen: Soft, nontender, nondistended.  Bowel sounds are heard.   Extremities: No cyanosis, clubbing or edema.  Peripheral pulses are palpable. Psych: Alert, awake and oriented, normal mood CNS:  No cranial nerve deficits.  Power equal in all extremities.   Skin: Warm and dry.  No rashes noted.  Data Reviewed:   CBC: Recent Labs  Lab 12/29/22 1652  WBC 9.0  NEUTROABS 6.0  HGB 12.6  HCT 39.4  MCV 88.5  PLT 314     Basic Metabolic Panel: Recent Labs  Lab 12/29/22 1652  NA 135  K  3.4*  CL 101  CO2 23  GLUCOSE 116*  BUN 12  CREATININE 0.84  CALCIUM 9.9     Liver Function Tests: Recent Labs  Lab 12/29/22 1652  AST 38  ALT 25  ALKPHOS 345*  BILITOT 0.6  PROT 8.4*  ALBUMIN 4.2      Radiology Studies: MR Lumbar Spine W Wo Contrast  Result Date: 12/29/2022 CLINICAL DATA:  Initial evaluation for back pain, history of metastatic disease. Evaluate for infection. EXAM: MRI LUMBAR SPINE WITHOUT AND WITH CONTRAST TECHNIQUE: Multiplanar and multiecho pulse sequences of the lumbar spine were obtained without and with intravenous contrast. CONTRAST:  7.275mL GADAVIST GADOBUTROL 1 MMOL/ML IV SOLN COMPARISON:  Prior radiograph from 07/16/2022. FINDINGS: Segmentation: Standard. Lowest well-formed disc space labeled the L5-S1 level. Alignment: Physiologic with preservation of the normal lumbar lordosis. No listhesis. Vertebrae: Widespread osseous metastatic disease seen throughout the lumbar spine and visualized sacrum/pelvis. There is involvement of essentially  all levels. Associated pathologic compression fractures involving the superior endplates of T12, L2, and L4 noted. Abnormal posterior convex bowing of the vertebral body cortex seen at L1 and L4. No significant extra osseous or epidural extension of tumor at this time. No MRI evidence for superimposed infection. Conus medullaris and cauda equina: Conus extends to the T12 level. Conus and cauda equina appear normal. No abnormal enhancement. Paraspinal and other soft tissues: Mild edema within the lower posterior paraspinous musculature, nonspecific, but could reflect a degree of muscular injury/strain. This is not felt to be related to infection given overall appearance. No other paraspinous inflammatory changes. No collections. Disc levels: L1-2: Disc desiccation without significant disc bulge. No stenosis. L2-3:  Unremarkable. L3-4: Degenerative intervertebral disc space narrowing with disc desiccation and mild disc bulge. Mild  facet spurring. No significant spinal stenosis. Foramina remain patent. L4-5: Negative interspace. Mild facet hypertrophy. No significant spinal stenosis. Foramina remain patent. L5-S1: Negative interspace. No significant canal or foraminal stenosis. IMPRESSION: 1. Widespread osseous metastatic disease throughout the lumbar spine and visualized sacrum/pelvis. No significant extra osseous or epidural extension of tumor at this time. No MRI evidence for superimposed infection. 2. Associated pathologic compression fractures involving the superior endplates of T12, L2, and L4. 3. Mild edema within the lower posterior paraspinous musculature, nonspecific, but could reflect muscular injury and/or strain. 4. Underlying mild for age spondylosis without significant stenosis or overt neural impingement. Electronically Signed   By: Rise Mu M.D.   On: 12/29/2022 22:35   MR THORACIC SPINE W WO CONTRAST  Result Date: 12/29/2022 CLINICAL DATA:  Initial evaluation for thoracic osteomyelitis. EXAM: MRI THORACIC WITHOUT AND WITH CONTRAST TECHNIQUE: Multiplanar and multiecho pulse sequences of the thoracic spine were obtained without and with intravenous contrast. CONTRAST:  7.55mL GADAVIST GADOBUTROL 1 MMOL/ML IV SOLN COMPARISON:  Prior study from 02/03/2022. FINDINGS: Alignment: Vertebral bodies normally aligned with preservation of the normal thoracic kyphosis. No listhesis. Vertebrae: Widespread osseous metastatic disease seen throughout the thoracic spine. There is involvement of essentially all levels, although involvement is most pronounced at T8 through T12. Pathologic compression fractures involving the T4, T5, T10, and T12 vertebral bodies noted height loss at T9 and T10 is mildly progressed as compared to previous MRI from 02/03/2022. Abnormal paraspinous edema and enhancement seen along the anterior margin of the spinal column at T9 through T11-12 (series 26, image 7). While this finding could reflect extra  osseous extension of tumor, superimposed infection with osteomyelitis and paraspinous phlegmonous change could be considered in the correct clinical setting. No loculated collections. Mild smooth epidural enhancement seen extending from T9 through T11 as well, possibly reactive. No other epidural involvement of tumor. No other paraspinous inflammatory changes. Cord: Normal signal and morphology. Mild smooth epidural enhancement at T9 through T11-12. No other abnormal enhancement or significant epidural involvement. Paraspinal and other soft tissues: Paraspinous edema adjacent to the T9 through T11 12 levels as above. Paraspinous soft tissues demonstrate no other acute finding. Trace layering bilateral pleural effusions. Disc levels: Underlying mild for age spondylosis. No significant disc bulge or focal disc herniation. No spinal stenosis. Foramina remain patent. IMPRESSION: 1. Widespread osseous metastatic disease throughout the thoracic spine. Pathologic compression fractures involving the T4, T5, T10, and T12 vertebral bodies. Mildly progressive height loss seen about the T10 fracture as compared to previous MRI from 02/03/2022. 2. Abnormal paraspinous edema and enhancement along the anterior margin of the spinal column at T9 through T11-12. Finding is nonspecific, and could reflect extra osseous  extension of tumor. Superimposed infection with osteomyelitis and paraspinous phlegmonous change difficult to exclude, and could be considered in the correct clinical setting. No loculated collections. 3. Mild smooth epidural enhancement extending from T9 through T11, possibly reactive. No other epidural involvement of tumor. 4. Trace layering bilateral pleural effusions. Electronically Signed   By: Rise Mu M.D.   On: 12/29/2022 22:24   CT CHEST ABDOMEN PELVIS W CONTRAST  Result Date: 12/29/2022 CLINICAL DATA:  History of metastatic breast cancer presenting with back pain. EXAM: CT CHEST, ABDOMEN, AND  PELVIS WITH CONTRAST TECHNIQUE: Multidetector CT imaging of the chest, abdomen and pelvis was performed following the standard protocol during bolus administration of intravenous contrast. RADIATION DOSE REDUCTION: This exam was performed according to the departmental dose-optimization program which includes automated exposure control, adjustment of the mA and/or kV according to patient size and/or use of iterative reconstruction technique. CONTRAST:  OMNIPAQUE IOHEXOL 300 MG/ML  SOLN COMPARISON:  November 18, 2022 FINDINGS: CT CHEST FINDINGS Cardiovascular: No significant vascular findings. Normal heart size. No pericardial effusion. Mediastinum/Nodes: No enlarged mediastinal, hilar, or axillary lymph nodes. Thyroid gland, trachea, and esophagus demonstrate no significant findings. Lungs/Pleura: Mild anteromedial right upper lobe and bilateral lower lobe linear atelectasis is seen. There is no evidence of acute infiltrate, pleural effusion or pneumothorax. Musculoskeletal: A stable 2.0 cm x 1.7 cm soft tissue mass is seen within the upper outer quadrant of the right breast. An associated biopsy clip is noted. Innumerable lytic and sclerotic lesions are seen throughout the visualized osseous structures. A mild, acute compression fracture deformity is seen involving the T10 vertebral body. Associated mild to moderate severity paravertebral soft tissue swelling is noted (axial CT images 46 through 52, CT series 2). This represents a new finding when compared to the prior exam. CT ABDOMEN PELVIS FINDINGS Hepatobiliary: There is diffuse fatty infiltration of the liver parenchyma. No focal liver abnormality is seen. No gallstones, gallbladder wall thickening, or biliary dilatation. Pancreas: Unremarkable. No pancreatic ductal dilatation or surrounding inflammatory changes. Spleen: Normal in size without focal abnormality. Adrenals/Urinary Tract: Adrenal glands are unremarkable. Kidneys are normal in size, without  renal calculi or hydronephrosis. A 2 mm nonobstructing renal calculus is seen within the mid left kidney. A 9 mm diameter simple cyst is seen within the anterior aspect of the mid to upper right kidney. Bladder is unremarkable. Stomach/Bowel: There is a small hiatal hernia. The appendix is not identified. No evidence of bowel wall thickening, distention, or inflammatory changes. Vascular/Lymphatic: No significant vascular findings are present. No enlarged abdominal or pelvic lymph nodes. Reproductive: A 2.5 cm diameter ill-defined uterine fibroid suspected within the uterine fundus. The bilateral adnexa are unremarkable. Other: No abdominal wall hernia or abnormality. No abdominopelvic ascites. Musculoskeletal: A metallic density intramedullary rod and compression screw device are seen within the proximal right femur. Innumerable lytic and sclerotic lesions are again seen throughout the spine and pelvis. IMPRESSION: 1. Acute compression fracture deformity of the T10 vertebral body. MRI correlation is recommended. 2. Innumerable lytic and sclerotic lesions throughout the visualized osseous structures, consistent with osseous metastasis. 3. Stable right breast soft tissue mass, as described above. Correlation with biopsy results is recommended. 4. Hepatic steatosis. 5. 2 mm nonobstructing left renal calculus. 6. Small hiatal hernia. 7. Prior open reduction and internal fixation of the proximal right femur. Electronically Signed   By: Aram Candela M.D.   On: 12/29/2022 20:08      LOS: 1 day    Joycelyn Das, MD  [facility] Available via Epic secure chat 7am-7pm After these hours, please refer to coverage provider listed on [email or web address] 12/30/2022, 2:54 PM

## 2022-12-30 NOTE — Hospital Course (Signed)
Natalie Griffin is a 47 y.o. female with medical history significant of metastatic breast cancer with extensive osseous metastasis, anxiety disorder, anemia of chronic disease, not on chemotherapy as outpatient presented to hospital with right flank and back pain.   Patient apparently opted not to go to chemotherapy.  Patient also had fever and urinary incontinence.  In the ER, CBC without leukocytosis.  Lipase 26.  Urinalysis with some proteinuria.  Had mild hypokalemia with potassium of 3.4.  Urine pregnancy test was negative.  Lactate was 1.0.  CT of the abdomen and pelvis showed T12 compression fracture. MRI of the lumbar spine showed compression fractures of the T12, L2 and L4.  No obvious abscess but multiple diffuse metastatic disease.  MRI of the thoracic spine showed pathologic fractures involving multiple levels and colluding T4-T5 T10 and T12 vertebral bodies with paraspinous edema and enhancement along the anterior margin of the spinal column.Possibly extraosseous extension of tumor.  Patient has superimposed infection with osteomyelitis and the paraspinous phlegmonous change suspected in the area.  But no loculated effusion.  There was trace layering of bilateral pleural effusions.  Case discussed with neurosurgery and infectious disease.  Patient was then considered for admission to hospital for further evaluation and treatment.  Assessment and Plan:   metastatic breast cancer to the bones with possible spinal infection:  Now with multiple compression fracture and possible spinal infection.  Neurosurgery and ID have been notified.  Blood cultures negative in less than 12 hours.  Follow further recommendations.  Continue IV antibiotic with Zosyn..  Continue pain management with Dilaudid and MS Contin and Percocet.  multiple compression fractures of the spine:  Secondary to pathological fracture from metastasis.  Pain management, neurosurgery recommendation.  hypokalemia: Was replenished.   Recheck today.  anxiety disorder: Continue Xanax.

## 2022-12-30 NOTE — Discharge Summary (Deleted)
PROGRESS NOTE    Natalie Griffin  IOX:[id number] DOB: 08/01/1976 DOA: 12/29/2022 PCP: Charlane Ferretti, DO    Brief Narrative:  Natalie Griffin is a 47 y.o. female with medical history significant of metastatic breast cancer with extensive osseous metastasis, anxiety disorder, anemia of chronic disease, not on chemotherapy as outpatient presented to hospital with right flank and back pain.   Patient apparently opted not to go to chemotherapy.  Patient also had fever and urinary incontinence.  In the ER, CBC without leukocytosis.  Lipase 26.  Urinalysis with some proteinuria.  Had mild hypokalemia with potassium of 3.4.  Urine pregnancy test was negative.  Lactate was 1.0.  CT of the abdomen and pelvis showed T12 compression fracture. MRI of the lumbar spine showed compression fractures of the T12, L2 and L4.  No obvious abscess but multiple diffuse metastatic disease.  MRI of the thoracic spine showed pathologic fractures involving multiple levels and colluding T4-T5 T10 and T12 vertebral bodies with paraspinous edema and enhancement along the anterior margin of the spinal column.Possibly extraosseous extension of tumor.  Patient has superimposed infection with osteomyelitis and the paraspinous phlegmonous change suspected in the area.  But no loculated effusion.  There was trace layering of bilateral pleural effusions.  Case discussed with neurosurgery and infectious disease.  Patient was then considered for admission to hospital for further evaluation and treatment.  Assessment and Plan:   Metastatic breast cancer to the bones with possible spinal infection:  Now with multiple compression fracture and possible spinal infection.  Neurosurgery and ID have been notified.  Blood cultures negative in less than 12 hours.  Spoke with infectious disease for consultation.  Recommended continuation of antibiotic and changing to Rocephin and vancomycin until cultures are negative.  Continue pain management with  Dilaudid and MS Contin and Percocet.  Of note patient follows up with Dr. Pamelia Hoit oncology as outpatient and has refused chemotherapy as outpatient.  Patient did have palliative radiation to the iliac bone and hip in the past.  Status post oophorectomy 09/30/2022, currently on anastrozole.  Multiple compression fractures of the spine:  Secondary to pathological fracture from metastasis.  Pain management, spoke with neurosurgery at baseline.  Neurosurgery has offered brace which she is thinking about it.  Neurosurgery recommended ID consultation and biopsy but patient has refused biopsy from the area.  Radiology however thinks that the findings could be more related to metastatic tumor.  hypokalemia: Was replenished on 12/29/2022.Marland Kitchen  Recheck today.  anxiety disorder: Continue Xanax.  Goals of care. Patient has intractable pain and metastatic cancer.  Will consider palliative care for goals of care discussion, pain management.     DVT prophylaxis: enoxaparin (LOVENOX) injection 40 mg Start: 12/30/22 2200   Code Status:     Code Status: DNR  Disposition: Home likely in 1 to 2 days  Status is: Inpatient  Remains inpatient appropriate because: Intractable pain, constipation, metastatic breast cancer to the bones.  Family Communication: None at bedside  Consultants:  Neurosurgery Infectious disease Palliative care  Procedures:  None yet  Antimicrobials:  Rocephin and vancomycin  Anti-infectives (From admission, onward)    Start     Dose/Rate Route Frequency Ordered Stop   12/30/22 1445  cefTRIAXone (ROCEPHIN) 2 g in sodium chloride 0.9 % 100 mL IVPB        2 g 200 mL/hr over 30 Minutes Intravenous Every 24 hours 12/30/22 1345     12/30/22 0700  piperacillin-tazobactam (ZOSYN) IVPB 3.375 g  Status:  Discontinued        3.375 g 12.5 mL/hr over 240 Minutes Intravenous Every 8 hours 12/30/22 0606 12/30/22 1345   12/29/22 2300  piperacillin-tazobactam (ZOSYN) IVPB 3.375 g        3.375  g 100 mL/hr over 30 Minutes Intravenous  Once 12/29/22 2254 12/30/22 0000       Subjective: Today, patient was seen and examined at bedside.  Patient complains of back pain and spasms.  Patient denies any nausea, vomiting, fever, chills or rigor.  Complains of constipation for almost a week.  Neurosurgery at bedside.  Objective: Vitals:   12/30/22 0203 12/30/22 0335 12/30/22 0756 12/30/22 1142  BP: 116/79 132/87 111/68 123/79  Pulse: (!) 111 84 79 82  Resp: 18 18 16 18   Temp: 98.6 F (37 C) 98.1 F (36.7 C) 98.5 F (36.9 C) 99.1 F (37.3 C)  TempSrc: Oral Oral Oral Oral  SpO2:  96% 98% 98%  Weight:        Intake/Output Summary (Last 24 hours) at 12/30/2022 1346 Last data filed at 12/30/2022 0930 Gross per 24 hour  Intake 240 ml  Output --  Net 240 ml   Filed Weights   12/29/22 1439  Weight: 79 kg    Physical Examination: Body mass index is 28.98 kg/m.   General:  Average built, not in obvious distress, HENT:   No scleral pallor or icterus noted. Oral mucosa is mildly dry Chest:  Clear breath sounds.   No crackles or wheezes.   CVS: S1 &S2 heard. No murmur.  Regular rate and rhythm. Abdomen: Soft, nontender, nondistended.  Bowel sounds are heard.   Extremities: No cyanosis, clubbing or edema.  Peripheral pulses are palpable. Psych: Alert, awake and oriented, normal mood CNS:  No cranial nerve deficits.  Power equal in all extremities.   Skin: Warm and dry.  No rashes noted.  Data Reviewed:   CBC: Recent Labs  Lab 12/29/22 1652  WBC 9.0  NEUTROABS 6.0  HGB 12.6  HCT 39.4  MCV 88.5  PLT 314    Basic Metabolic Panel: Recent Labs  Lab 12/29/22 1652  NA 135  K 3.4*  CL 101  CO2 23  GLUCOSE 116*  BUN 12  CREATININE 0.84  CALCIUM 9.9    Liver Function Tests: Recent Labs  Lab 12/29/22 1652  AST 38  ALT 25  ALKPHOS 345*  BILITOT 0.6  PROT 8.4*  ALBUMIN 4.2     Radiology Studies: MR Lumbar Spine W Wo Contrast  Result Date:  12/29/2022 CLINICAL DATA:  Initial evaluation for back pain, history of metastatic disease. Evaluate for infection. EXAM: MRI LUMBAR SPINE WITHOUT AND WITH CONTRAST TECHNIQUE: Multiplanar and multiecho pulse sequences of the lumbar spine were obtained without and with intravenous contrast. CONTRAST:  7.425mL GADAVIST GADOBUTROL 1 MMOL/ML IV SOLN COMPARISON:  Prior radiograph from 07/16/2022. FINDINGS: Segmentation: Standard. Lowest well-formed disc space labeled the L5-S1 level. Alignment: Physiologic with preservation of the normal lumbar lordosis. No listhesis. Vertebrae: Widespread osseous metastatic disease seen throughout the lumbar spine and visualized sacrum/pelvis. There is involvement of essentially all levels. Associated pathologic compression fractures involving the superior endplates of T12, L2, and L4 noted. Abnormal posterior convex bowing of the vertebral body cortex seen at L1 and L4. No significant extra osseous or epidural extension of tumor at this time. No MRI evidence for superimposed infection. Conus medullaris and cauda equina: Conus extends to the T12 level. Conus and cauda equina appear normal. No abnormal enhancement. Paraspinal and  other soft tissues: Mild edema within the lower posterior paraspinous musculature, nonspecific, but could reflect a degree of muscular injury/strain. This is not felt to be related to infection given overall appearance. No other paraspinous inflammatory changes. No collections. Disc levels: L1-2: Disc desiccation without significant disc bulge. No stenosis. L2-3:  Unremarkable. L3-4: Degenerative intervertebral disc space narrowing with disc desiccation and mild disc bulge. Mild facet spurring. No significant spinal stenosis. Foramina remain patent. L4-5: Negative interspace. Mild facet hypertrophy. No significant spinal stenosis. Foramina remain patent. L5-S1: Negative interspace. No significant canal or foraminal stenosis. IMPRESSION: 1. Widespread osseous  metastatic disease throughout the lumbar spine and visualized sacrum/pelvis. No significant extra osseous or epidural extension of tumor at this time. No MRI evidence for superimposed infection. 2. Associated pathologic compression fractures involving the superior endplates of T12, L2, and L4. 3. Mild edema within the lower posterior paraspinous musculature, nonspecific, but could reflect muscular injury and/or strain. 4. Underlying mild for age spondylosis without significant stenosis or overt neural impingement. Electronically Signed   By: Rise Mu M.D.   On: 12/29/2022 22:35   MR THORACIC SPINE W WO CONTRAST  Result Date: 12/29/2022 CLINICAL DATA:  Initial evaluation for thoracic osteomyelitis. EXAM: MRI THORACIC WITHOUT AND WITH CONTRAST TECHNIQUE: Multiplanar and multiecho pulse sequences of the thoracic spine were obtained without and with intravenous contrast. CONTRAST:  7.103mL GADAVIST GADOBUTROL 1 MMOL/ML IV SOLN COMPARISON:  Prior study from 02/03/2022. FINDINGS: Alignment: Vertebral bodies normally aligned with preservation of the normal thoracic kyphosis. No listhesis. Vertebrae: Widespread osseous metastatic disease seen throughout the thoracic spine. There is involvement of essentially all levels, although involvement is most pronounced at T8 through T12. Pathologic compression fractures involving the T4, T5, T10, and T12 vertebral bodies noted height loss at T9 and T10 is mildly progressed as compared to previous MRI from 02/03/2022. Abnormal paraspinous edema and enhancement seen along the anterior margin of the spinal column at T9 through T11-12 (series 26, image 7). While this finding could reflect extra osseous extension of tumor, superimposed infection with osteomyelitis and paraspinous phlegmonous change could be considered in the correct clinical setting. No loculated collections. Mild smooth epidural enhancement seen extending from T9 through T11 as well, possibly reactive. No  other epidural involvement of tumor. No other paraspinous inflammatory changes. Cord: Normal signal and morphology. Mild smooth epidural enhancement at T9 through T11-12. No other abnormal enhancement or significant epidural involvement. Paraspinal and other soft tissues: Paraspinous edema adjacent to the T9 through T11 12 levels as above. Paraspinous soft tissues demonstrate no other acute finding. Trace layering bilateral pleural effusions. Disc levels: Underlying mild for age spondylosis. No significant disc bulge or focal disc herniation. No spinal stenosis. Foramina remain patent. IMPRESSION: 1. Widespread osseous metastatic disease throughout the thoracic spine. Pathologic compression fractures involving the T4, T5, T10, and T12 vertebral bodies. Mildly progressive height loss seen about the T10 fracture as compared to previous MRI from 02/03/2022. 2. Abnormal paraspinous edema and enhancement along the anterior margin of the spinal column at T9 through T11-12. Finding is nonspecific, and could reflect extra osseous extension of tumor. Superimposed infection with osteomyelitis and paraspinous phlegmonous change difficult to exclude, and could be considered in the correct clinical setting. No loculated collections. 3. Mild smooth epidural enhancement extending from T9 through T11, possibly reactive. No other epidural involvement of tumor. 4. Trace layering bilateral pleural effusions. Electronically Signed   By: Rise Mu M.D.   On: 12/29/2022 22:24   CT CHEST ABDOMEN PELVIS  W CONTRAST  Result Date: 12/29/2022 CLINICAL DATA:  History of metastatic breast cancer presenting with back pain. EXAM: CT CHEST, ABDOMEN, AND PELVIS WITH CONTRAST TECHNIQUE: Multidetector CT imaging of the chest, abdomen and pelvis was performed following the standard protocol during bolus administration of intravenous contrast. RADIATION DOSE REDUCTION: This exam was performed according to the departmental dose-optimization  program which includes automated exposure control, adjustment of the mA and/or kV according to patient size and/or use of iterative reconstruction technique. CONTRAST:  OMNIPAQUE IOHEXOL 300 MG/ML  SOLN COMPARISON:  November 18, 2022 FINDINGS: CT CHEST FINDINGS Cardiovascular: No significant vascular findings. Normal heart size. No pericardial effusion. Mediastinum/Nodes: No enlarged mediastinal, hilar, or axillary lymph nodes. Thyroid gland, trachea, and esophagus demonstrate no significant findings. Lungs/Pleura: Mild anteromedial right upper lobe and bilateral lower lobe linear atelectasis is seen. There is no evidence of acute infiltrate, pleural effusion or pneumothorax. Musculoskeletal: A stable 2.0 cm x 1.7 cm soft tissue mass is seen within the upper outer quadrant of the right breast. An associated biopsy clip is noted. Innumerable lytic and sclerotic lesions are seen throughout the visualized osseous structures. A mild, acute compression fracture deformity is seen involving the T10 vertebral body. Associated mild to moderate severity paravertebral soft tissue swelling is noted (axial CT images 46 through 52, CT series 2). This represents a new finding when compared to the prior exam. CT ABDOMEN PELVIS FINDINGS Hepatobiliary: There is diffuse fatty infiltration of the liver parenchyma. No focal liver abnormality is seen. No gallstones, gallbladder wall thickening, or biliary dilatation. Pancreas: Unremarkable. No pancreatic ductal dilatation or surrounding inflammatory changes. Spleen: Normal in size without focal abnormality. Adrenals/Urinary Tract: Adrenal glands are unremarkable. Kidneys are normal in size, without renal calculi or hydronephrosis. A 2 mm nonobstructing renal calculus is seen within the mid left kidney. A 9 mm diameter simple cyst is seen within the anterior aspect of the mid to upper right kidney. Bladder is unremarkable. Stomach/Bowel: There is a small hiatal hernia. The appendix  is not identified. No evidence of bowel wall thickening, distention, or inflammatory changes. Vascular/Lymphatic: No significant vascular findings are present. No enlarged abdominal or pelvic lymph nodes. Reproductive: A 2.5 cm diameter ill-defined uterine fibroid suspected within the uterine fundus. The bilateral adnexa are unremarkable. Other: No abdominal wall hernia or abnormality. No abdominopelvic ascites. Musculoskeletal: A metallic density intramedullary rod and compression screw device are seen within the proximal right femur. Innumerable lytic and sclerotic lesions are again seen throughout the spine and pelvis. IMPRESSION: 1. Acute compression fracture deformity of the T10 vertebral body. MRI correlation is recommended. 2. Innumerable lytic and sclerotic lesions throughout the visualized osseous structures, consistent with osseous metastasis. 3. Stable right breast soft tissue mass, as described above. Correlation with biopsy results is recommended. 4. Hepatic steatosis. 5. 2 mm nonobstructing left renal calculus. 6. Small hiatal hernia. 7. Prior open reduction and internal fixation of the proximal right femur. Electronically Signed   By: Aram Candela M.D.   On: 12/29/2022 20:08      LOS: 1 day    Joycelyn Das, MD [facility] Available via Epic secure chat 7am-7pm After these hours, please refer to coverage provider listed on [email or web address] 12/30/2022, 1:46 PM

## 2022-12-30 NOTE — Progress Notes (Signed)
Informed by Tresa Moore, RN, Patient will be transported via private vehicle.

## 2022-12-30 NOTE — Progress Notes (Signed)
HEMATOLOGY-ONCOLOGY TELEPHONE VISIT PROGRESS NOTE  I connected with our patient on 12/31/22 at  1:30 PM EDT by telephone and verified that I am speaking with the correct person using two identifiers.  I discussed the limitations, risks, security and privacy concerns of performing an evaluation and management service by telephone and the availability of in person appointments.  I also discussed with the patient that there may be a patient responsible charge related to this service. The patient expressed understanding and agreed to proceed.   History of Present Illness: Natalie Griffin is a  47 year old with a history of left breast cancer. Currently on anastrozole. She presents to the clinic for a telephone follow-up to review scans.  She has been in the hospital with severe muscle spasms and had extensive workup including CT of the chest abdomen pelvis and MRIs of the thoracic and cervical spine.  CT scans showed multiple compression fractures but no internal organ involvement.  She was prescribed pain medications and muscle relaxants and finally her pain has become manageable and she is going home today.  Oncology History  Malignant neoplasm of upper-inner quadrant of left breast in female, estrogen receptor positive  04/11/2020 Initial Diagnosis   Patient palpated a left breast lump. Mammogram on 03/08/20 showed a 3.0cm mass at the 11 o'clock position with no axillary adenopathy. 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    02/2021 Relapse/Recurrence   Patient went to  orthopedics for right hip pain.  Imaging revealed bone metastasis.  CT CAP: Cortical destruction of the right femoral neck consistent with skeletal metastases at risk for pathologic fracture.  Lucent lesion in the left iliac bone and L3 vertebral body consistent with multifocal skeletal metastases.   04/01/2021 Surgery   04/01/2021: Femoral intramedullary nail  Pathology review: Metastatic breast cancer ER 2% PR 0% HER2 3+ positive   04/08/2021 -  Anti-estrogen oral therapy   Tamoxifen 20 mg was prescribed on 04/08/2021 (discontinued by patient because she felt that the risks and benefits did not support it )--resumed in 07/2022 based on pathology results from right breast biopsy   05/14/2021 -  Chemotherapy   Subcutaneous Herceptin Perjeta (Phesgo) injection started 05/14/2021, switched to Herceptin Hylecta 06/11/2021 (Fixed drug eruption, profound diarrhea to Phesgo)     05/20/2021 - 06/03/2021 Radiation Therapy   Palliative radiation to the iliac bone and hip   02/04/2022 Imaging   MRI thoracic spine 02/04/2022: Numerous bone metastases thoracic spine cervical and lumbar spines largest T5, T8, T11.  Chronic compression fractures T4, T5, T10 and T12    05/07/2022 Imaging   CT chest abdomen pelvis: No new or progressive bone metastases.  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   07/29/2022 PET scan   IMPRESSION: 1. Hypermetabolic mass in the posterior deep RIGHT breast consistent primary breast carcinoma. 2. Central nodal mediastinal metastasis  to the high LEFT prevascular space and LEFT super clavicular node. 3. 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  [name] recommended changing treatment to Kadcyla and Henslee wanted to  wait until after the holidays.  She will discuss further with Dr. Pamelia Hoit in January.   11/19/2022 - 11/19/2022 Chemotherapy   Patient is on Treatment Plan : BREAST Trastuzumab IV (8/6) or SQ (600) D1 q21d       REVIEW OF SYSTEMS:   Constitutional: Denies fevers, chills or abnormal weight loss All other systems were reviewed with the patient and are negative. Observations/Objective:     Assessment Plan:  Malignant neoplasm of upper-inner quadrant of left breast in female, estrogen receptor positive 06/04/2020:Left lumpectomy ([name]): invasive and in situ ductal carcinoma, 3.2cm, clear margins, one left axillary lymph node negative for carcinoma.  Patient refused adjuvant chemo, adjuvant radiation and adjuvant antiestrogen therapy.   Patient went to orthopedics for right hip pain.  Imaging revealed bone metastasis.  CT CAP: Cortical destruction of the right femoral neck consistent with skeletal metastases at risk for pathologic fracture.  Lucent lesion in the left iliac bone and L3 vertebral body consistent with multifocal skeletal metastases. 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   CT chest abdomen pelvis 05/07/2022: No new or progressive bone metastases.  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%, HER2 3+ positive,  Ki-67 35%     Treatment plan change: Oophorectomy: 09/30/2022 Current treatment: Anastrozole Patient refused switching from Herceptin to Kadcyla (her insurance is denied Herceptin Hylecta and patient does not want to receive it in IV form), stopped Herceptin Refused neratinib   Pain control: MS Contin and Percocets that she takes as needed, muscle relaxants have been  12/17/2022: Mammogram and ultrasound right breast: Mild interval increase in size of the biopsy-proven newly lesion 1.9 cm  Patient is switching her insurance and she thinks that the Herceptin Hylecta will be approved by her new insurance.  If he does get approved that is what she wants to do.  She does not want to Kadcyla or neratinib or any other treatment.  I will see her back in 1 month for follow-up.    I discussed the assessment and treatment plan with the patient. The patient was provided an opportunity to ask questions and all were answered. The patient agreed with the plan and demonstrated an understanding of the instructions. The patient was advised to call back or seek an in-person evaluation if the symptoms worsen or if the condition fails to improve as anticipated.   I provided 12 minutes of non-face-to-face time during this encounter.  This includes time for charting and coordination of care   Tamsen Meek, MD  I Janan Ridge am acting as a scribe for Dr.Toby Breithaupt  I have reviewed the above documentation for accuracy and completeness, and I agree with the above.

## 2022-12-30 NOTE — Progress Notes (Signed)
1300- Dr. Rebekah Chesterfield made aware pt refusing IV Zosyn as she is unclear of regimen plan at this time. Request made for palliative consult and bowel regimen. See new orders placed.

## 2022-12-30 NOTE — Consult Note (Signed)
   Providing Compassionate, Quality Care - Together  Neurosurgery Consult  Referring physician: Dr. 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  She denies any bowel or bladder changes.  She denies any lower extremity weakness numbness or tingling.  She denies any fevers.  She underwent evaluation and workup which revealed extensive osseous metastasis with multiple thoracolumbar compression fractures and concern for  osteo diskitis at T9-12 verses extraosseous  extension of tumor.  She states her back pain is been improving and what primarily bothers her is her right flank pain radiating to her abdomen.   Medications: I have reviewed the patient's current medications. Allergies: No Known Allergies  History reviewed. No pertinent family history. Social History:  has no history on file for tobacco use, alcohol use, and drug use.  ROS:   All pertinent positives and negatives are listed in HPI above  Physical Exam:  Vital signs in last 24 hours: Temp:  [98 F (36.7 C)-98.3 F (36.8 C)] 98 F (36.7 C) (07/25 1814) Pulse Rate:  [58-128] 65 (07/26 0746) Resp:  [11-18] 14 (07/26 0217) BP: (138-182)/(65-125) 153/88 (07/26 0700) SpO2:  [91 %-98 %] 96 % (07/26 0746) PE:  Awake alert oriented x3, pleasant  Speech fluent appropriate  PERRLA  Face symmetric  Moving all extremities equally  Sitting in the bed upright, comfortable   Impression/Assessment:   47 year old female with  1.  Extensive osseous metastasis due to breast cancer 2.  Concern for possible osteo diskitis  Plan:  - I recommended to the patient and IR guided biopsy for evaluation of osteo diskitis versus tumor however she is not interested in this at this time due  to risk of spinal cord injury, which I did explain is low. -  I do not recommend any acute neurosurgical intervention, her compression fractures are mild and stable.  I did offer her a TLSO which she is not interested in at this time. -  I recommend pain control, PT/ OT evaluation and treatment - recommend Infectious Disease evaluation for possible broad-spectrum antibiotic coverage which she is contemplating as well as evaluation from Oncology for continued guided therapy. -MRI reviewed, agree with   Radiologist interpretation.  There is no cord compression or cord signal change.  Thank you for allowing me to participate in this patient's care.  Please do not hesitate to call with questions or concerns.   Monia Pouch, DO Neurosurgeon [facility] Cell: [phone]

## 2022-12-30 NOTE — ED Notes (Signed)
Spoke with Jesse Sans at [facility] regarding transfer

## 2022-12-30 NOTE — Progress Notes (Signed)
Interventional Radiology Brief Note:  47 year old female with history of metastatic breast cancer admitted with back pain radiating into her right flank and abdomen. IR consulted for aspiration vs. biopsy of abnormal but non-specific paraspinous edema and enhancement along the anterior margin of the spinal column at T9 through T11-12. Case reviewed by Dr. Lowella Dandy who notes the area in question by imaging, however recommends proceeding with aspiration vs. Biopsy only if care would be enhanced by diagnostic finding.  Patient has known metastatic disease and Dr. Lowella Dandy feels the abnormality is this area is most consistent with known disease extension.  Furthermore, though osteo/discitis is possible, there are currently no signs/symptoms of infection.  WBC stable, she remains afebrile, and her back pain is reportedly improving.   Also note that per documented discussion with Neurosurgery, patient is currently refusing procedures.   IR remains available.  No procedure planned at this time.  Care team notified.   Loyce Dys, MS RD PA-C

## 2022-12-30 NOTE — Consult Note (Addendum)
[facility]                                                                                        Patient Identification: Patient Name: Natalie LuzCandace A Griffin MRN: [id number] Admit Date: 12/29/2022  2:19 PM Today's Date: 12/30/2022 Reason for consult: concern for spinal infection  Requesting provider: Dr Tyson BabinskiPokhrel   Principal Problem:   Spinal abscess Active Problems:   Malignant neoplasm of upper-inner quadrant of left breast in female, estrogen receptor positive   Pathological fracture of right hip   Closed wedge compression fracture of T10 vertebra   Hypokalemia   Antibiotics:  Zosyn 4/9-  Lines/Hardware: IM nail in femur  Assessment [age] year old female with PMH of metastatic breast cancer including bone mets status post bilateral salpingo-oophorectomy as well as palliative radiation of iliac crest and hip not on tx per patient's preference on anastrozole who presented to the ED on 4/9 with acute on chronic right lower back pain that started last Friday.  MRI with multiple abnormalities related to bone mets from breast ca and also question about discitis and osteomyelitis.   No plan for neurosurgical intervention  Per IR Dr Lowella DandyHenn as well as my separate discussion with radiologist Dr Tasia CatchingsAhmed, MRI findings are more s/o reactive edema related to known bone mets from breast ca and less likely infection related.  She had reported one episode of fever on admission but relays it was likely due to hot flashes and she did not take temperature at home.   She has refused aspiration vs biopsy to r/o any vertebral infection at this time to me as well as other providers, I discussed with her about medical management of TRUE vertebral infection would consist of  prolonged IV abtx/PICC and did not seem to be interested as well if suspicion of vertebral infection is low.    Recommendations  Will change zosyn to  ceftriaxone and add Vancomycin, pharmacy to dose for the time being while waiting for blood cx for 48 hrs  ESR, CRP added on although possibly this could be abnormal due to known malignancy  If blood cx are negative in 48 hrs, will consider dc'ing IV abtx D/w Primary team   Rest of the management as per the primary team. Please call with questions or concerns.  Thank you for the consult  Odette FractionSabina Tallie Dodds, MD Infectious Disease Physician [facility]  [facility] [address] [place], [place] [address] Phone: [phone]  Fax: [phone]  __________________________________________________________________________________________________________ HPI and Hospital Course: [age] year old female with PMH of metastatic breast cancer including bone mets status post bilateral salpingo-oophorectomy s/p palliative radiation of iliac crest and hip not on tx per patient's preference on anastrozole who presented to the ED on 4/9 with acute on chronic right lower back pain that started last Friday.  Back pain radiated to right mid abdomen Saturday, 1 episode of urinary incontinence.  Felt subjective warmth at home but reports it was related to her hot flashes.  Denies having chills, night sweats, nausea vomiting or diarrhea.  Denies any chest pain, shortness of breath or cough.  Denies any  urinary burning or dysuria.  She reports to me she basically came to the ED for her back spasms which she reports has improved. Reports she lost approx 7 lbs that was intentional   Denies smoking, alcohol and IVDU.   At ED, afebrile. Labs with no leukocytosis  Imaging as below Seen by Neurosurgery and no neurosurgical intervention recommended and she has refused IR guided biopsy to r/o vertebral infection   General- Denies fever, chills, loss of appetite HEENT - Denies headache, blurry vision, neck pain, sinus pain Chest - Denies any chest pain, SOB or cough CVS- Denies any  dizziness/lightheadedness, syncopal attacks, palpitations Abdomen- Denies any nausea, vomiting, abdominal pain, hematochezia and diarrhea. Constipation+ Neuro - Denies any weakness, numbness, tingling sensation Psych - Denies any changes in mood irritability or depressive symptoms GU- Denies any burning, dysuria, hematuria or increased frequency of urination Skin - denies any rashes/lesions MSK - denies any joint pain/swelling or restricted ROM   Past Medical History:  Diagnosis Date   Anemia    Anxiety    Bone metastasis    Breast cancer 2021   Left breast invasive ductal carcinoma   Cancer 2021   Left breast   Family history of adverse reaction to anesthesia    Father and Aunt have pseudocholinesterase deficiency - Trouble waking up from anesthesia   History of hiatal hernia    History of kidney stones    noted on CT Left nonobstructive   Past Surgical History:  Procedure Laterality Date   BREAST BIOPSY Right 07/21/2022   [place] RT BREAST BX W LOC DEV 1ST LESION IMG BX SPEC US GUIDE 07/21/2022 [facility]MO[facility]   BREAST LUMPECTOMY WITH RADIOACTIVE SEED AND SENTINEL LYMPH NODE BIOPSY Left 06/04/2020   Procedure: LEFT BREAST LUMPECTOMY WITH RADIOACTIVE SEED AND SENTINEL LYMPH NODE MAPPING;  Surgeon: Harriette Bouillon, MD;  Location: [facility] OR;  Service: General;  Laterality: Left;  PEC BLOCK, RNFA   FEMUR IM NAIL Right 04/01/2021   Procedure: INTRAMEDULLARY (IM) NAIL FEMORAL;  Surgeon: Sheral Apley, MD;  Location: [facility];  Service: Orthopedics;  Laterality: Right;   ROBOTIC ASSISTED BILATERAL SALPINGO OOPHERECTOMY Bilateral 09/30/2022   Procedure: XI ROBOTIC ASSISTED BILATERAL SALPINGO OOPHORECTOMY;  Surgeon: Carver Fila, MD;  Location: [facility];  Service: Gynecology;  Laterality: Bilateral;   WISDOM TOOTH EXTRACTION       Scheduled Meds:  anastrozole  1 mg Oral Daily   calcium carbonate  1,250 mg Oral TID WC   enoxaparin (LOVENOX) injection  40 mg Subcutaneous Q24H   ferrous  sulfate  324 mg Oral Q1500   morphine  30 mg Oral Q12H   Continuous Infusions:  dextrose 5% lactated ringers with KCl 20 mEq/L 100 mL/hr at 12/30/22 0344   piperacillin-tazobactam (ZOSYN)  IV     PRN Meds:.ALPRAZolam, bisacodyl, HYDROmorphone (DILAUDID) injection, methocarbamol, ondansetron **OR** ondansetron (ZOFRAN) IV, oxyCODONE-acetaminophen **AND** oxyCODONE  Allergies  Allergen Reactions   Adhesive [Tape] Other (See Comments)    Blistering with steri strips   Fentanyl Itching, Nausea Only and Rash    08/11/2022 reports removing patch two days ago. Experienced "rash, itching, nausea, headache and breathing" affected.      Phesgo [Pertuz-Trastuz-Hyaluron-Zzxf] Diarrhea and Rash    Phesgo side effect: Fixed drug eruption, profound diarrhea Therefore we will discontinue Phesgo and start her on Herceptin Hylecta Inj   Tetanus Toxoid Anaphylaxis   Succinylcholine Other (See Comments)    Her Body does not have enough enzymes to carry it out of her  body   Social History   Socioeconomic History   Marital status: Single    Spouse name: Not on file   Number of children: Not on file   Years of education: Not on file   Highest education level: Not on file  Occupational History   Not on file  Tobacco Use   Smoking status: Former    Types: Cigarettes    Quit date: 05/19/2020    Years since quitting: 2.6   Smokeless tobacco: Never  Vaping Use   Vaping Use: Never used  Substance and Sexual Activity   Alcohol use: Not Currently   Drug use: Never   Sexual activity: Not Currently  Other Topics Concern   Not on file  Social History Narrative   Not on file   Social Determinants of Health   Financial Resource Strain: Not on file  Food Insecurity: Not on file  Transportation Needs: Not on file  Physical Activity: Not on file  Stress: Not on file  Social Connections: Not on file  Intimate Partner Violence: Not At Risk (05/06/2021)   Humiliation, Afraid, Rape, and Kick  questionnaire    Fear of Current or Ex-Partner: No    Emotionally Abused: No    Physically Abused: No    Sexually Abused: No   Family History  Problem Relation Age of Onset   Colon cancer Neg Hx    Esophageal cancer Neg Hx    Stomach cancer Neg Hx    Rectal cancer Neg Hx    Vitals BP 111/68 (BP Location: Right Arm)   Pulse 79   Temp 98.5 F (36.9 C) (Oral)   Resp 16   Wt 79 kg   SpO2 98%   BMI 28.98 kg/m    Physical Exam Constitutional: Adult female lying in the bed with some pain in the back    Comments:   Cardiovascular:     Rate and Rhythm: Normal rate and regular rhythm.     Heart sounds: s1s2  Pulmonary:     Effort: Pulmonary effort is normal on room air     Comments: Normal breath sounds   Abdominal:     Palpations: Abdomen is soft.     Tenderness: non distended and non tender   Musculoskeletal:        General: No swelling or tenderness in peripheral joints. Told me not to palpate lower TL spine, no upper thoracic and cervical tenderness   Skin:    Comments: No rashes   Neurological:     General: awake, alert and oriented. Grossly non focal, follows commands   Psychiatric:        Mood and Affect: Mood normal.    Pertinent Microbiology Results for orders placed or performed during the hospital encounter of 12/29/22  Blood culture (routine x 2)     Status: None (Preliminary result)   Collection Time: 12/29/22  6:10 PM   Specimen: BLOOD  Result Value Ref Range Status   Specimen Description   Final    BLOOD BLOOD RIGHT FOREARM Performed at [facility], [address], [place], [place] [address]    Special Requests   Final    BOTTLES DRAWN AEROBIC AND ANAEROBIC Blood Culture results may not be optimal due to an excessive volume of blood received in culture bottles Performed at [facility], [address] [place], [place] [address]    Culture   Final    NO GROWTH < 12 HOURS Performed at [facility], [address], [place], [place] [address]    Report Status PENDING  Incomplete  Blood culture (routine x 2)     Status: None (Preliminary result)   Collection Time: 12/29/22  7:55 PM   Specimen: BLOOD LEFT HAND  Result Value Ref Range Status   Specimen Description   Final    BLOOD LEFT HAND Performed at [facility], [address], [place], [place] [address]    Special Requests   Final    BOTTLES DRAWN AEROBIC AND ANAEROBIC Blood Culture results may not be optimal due to an excessive volume of blood received in culture bottles Performed at [facility], [address], [place], [place] [address]    Culture   Final    NO GROWTH < 12 HOURS Performed at [facility], [address] [place], [place] [address]    Report Status PENDING  Incomplete   Pertinent Lab seen by me:    Latest Ref Rng & Units 12/29/2022    4:52 PM 09/23/2022    2:17 PM 09/17/2022   12:01 PM  CBC  WBC 4.0 - 10.5 K/uL 9.0  5.5  6.7   Hemoglobin 12.0 - 15.0 g/dL 29.5  62.1  30.8   Hematocrit 36.0 - 46.0 % 39.4  37.4  33.5   Platelets 150 - 400 K/uL 314  292  279       Latest Ref Rng & Units 12/29/2022    4:52 PM 09/23/2022    2:17 PM 09/17/2022   12:01 PM  CMP  Glucose 70 - 99 mg/dL 657  846  962   BUN 6 - 20 mg/dL 12  14  13    Creatinine 0.44 - 1.00 mg/dL 9.52  8.41  3.24   Sodium 135 - 145 mmol/L 135  139  141   Potassium 3.5 - 5.1 mmol/L 3.4  4.8  4.2   Chloride 98 - 111 mmol/L 101  105  105   CO2 22 - 32 mmol/L 23  24  27    Calcium 8.9 - 10.3 mg/dL 9.9  9.7  9.9   Total Protein 6.5 - 8.1 g/dL 8.4  7.6  7.4   Total Bilirubin 0.3 - 1.2 mg/dL 0.6  0.2  0.2   Alkaline Phos 38 - 126 U/L 345  160  193   AST 15 - 41 U/L 38  59  45   ALT 0 - 44 U/L 25  52  40     Pertinent Imagings/Other Imagings Plain films and CT images have been personally visualized and interpreted; radiology reports have been reviewed. Decision making incorporated into the Impression /  Recommendations.  MR Lumbar Spine W Wo Contrast  Result Date: 12/29/2022 CLINICAL DATA:  Initial evaluation for back pain, history of metastatic disease. Evaluate for infection. EXAM: MRI LUMBAR SPINE WITHOUT AND WITH CONTRAST TECHNIQUE: Multiplanar and multiecho pulse sequences of the lumbar spine were obtained without and with intravenous contrast. CONTRAST:  7.28mL GADAVIST GADOBUTROL 1 MMOL/ML IV SOLN COMPARISON:  Prior radiograph from 07/16/2022. FINDINGS: Segmentation: Standard. Lowest well-formed disc space labeled the L5-S1 level. Alignment: Physiologic with preservation of the normal lumbar lordosis. No listhesis. Vertebrae: Widespread osseous metastatic disease seen throughout the lumbar spine and visualized sacrum/pelvis. There is involvement of essentially all levels. Associated pathologic compression fractures involving the superior endplates of T12, L2, and L4 noted. Abnormal posterior convex bowing of the vertebral body cortex seen at L1 and L4. No significant  extra osseous or epidural extension of tumor at this time. No MRI evidence for superimposed infection. Conus medullaris and cauda equina: Conus extends to the T12 level. Conus and cauda equina appear normal. No abnormal enhancement. Paraspinal and other soft tissues: Mild edema within the lower posterior paraspinous musculature, nonspecific, but could reflect a degree of muscular injury/strain. This is not felt to be related to infection given overall appearance. No other paraspinous inflammatory changes. No collections. Disc levels: L1-2: Disc desiccation without significant disc bulge. No stenosis. L2-3:  Unremarkable. L3-4: Degenerative intervertebral disc space narrowing with disc desiccation and mild disc bulge. Mild facet spurring. No significant spinal stenosis. Foramina remain patent. L4-5: Negative interspace. Mild facet hypertrophy. No significant spinal stenosis. Foramina remain patent. L5-S1: Negative interspace. No significant  canal or foraminal stenosis. IMPRESSION: 1. Widespread osseous metastatic disease throughout the lumbar spine and visualized sacrum/pelvis. No significant extra osseous or epidural extension of tumor at this time. No MRI evidence for superimposed infection. 2. Associated pathologic compression fractures involving the superior endplates of T12, L2, and L4. 3. Mild edema within the lower posterior paraspinous musculature, nonspecific, but could reflect muscular injury and/or strain. 4. Underlying mild for age spondylosis without significant stenosis or overt neural impingement. Electronically Signed   By: Rise Mu M.D.   On: 12/29/2022 22:35   MR THORACIC SPINE W WO CONTRAST  Result Date: 12/29/2022 CLINICAL DATA:  Initial evaluation for thoracic osteomyelitis. EXAM: MRI THORACIC WITHOUT AND WITH CONTRAST TECHNIQUE: Multiplanar and multiecho pulse sequences of the thoracic spine were obtained without and with intravenous contrast. CONTRAST:  7.43mL GADAVIST GADOBUTROL 1 MMOL/ML IV SOLN COMPARISON:  Prior study from 02/03/2022. FINDINGS: Alignment: Vertebral bodies normally aligned with preservation of the normal thoracic kyphosis. No listhesis. Vertebrae: Widespread osseous metastatic disease seen throughout the thoracic spine. There is involvement of essentially all levels, although involvement is most pronounced at T8 through T12. Pathologic compression fractures involving the T4, T5, T10, and T12 vertebral bodies noted height loss at T9 and T10 is mildly progressed as compared to previous MRI from 02/03/2022. Abnormal paraspinous edema and enhancement seen along the anterior margin of the spinal column at T9 through T11-12 (series 26, image 7). While this finding could reflect extra osseous extension of tumor, superimposed infection with osteomyelitis and paraspinous phlegmonous change could be considered in the correct clinical setting. No loculated collections. Mild smooth epidural enhancement seen  extending from T9 through T11 as well, possibly reactive. No other epidural involvement of tumor. No other paraspinous inflammatory changes. Cord: Normal signal and morphology. Mild smooth epidural enhancement at T9 through T11-12. No other abnormal enhancement or significant epidural involvement. Paraspinal and other soft tissues: Paraspinous edema adjacent to the T9 through T11 12 levels as above. Paraspinous soft tissues demonstrate no other acute finding. Trace layering bilateral pleural effusions. Disc levels: Underlying mild for age spondylosis. No significant disc bulge or focal disc herniation. No spinal stenosis. Foramina remain patent. IMPRESSION: 1. Widespread osseous metastatic disease throughout the thoracic spine. Pathologic compression fractures involving the T4, T5, T10, and T12 vertebral bodies. Mildly progressive height loss seen about the T10 fracture as compared to previous MRI from 02/03/2022. 2. Abnormal paraspinous edema and enhancement along the anterior margin of the spinal column at T9 through T11-12. Finding is nonspecific, and could reflect extra osseous extension of tumor. Superimposed infection with osteomyelitis and paraspinous phlegmonous change difficult to exclude, and could be considered in the correct clinical setting. No loculated collections. 3. Mild smooth epidural enhancement extending  from T9 through T11, possibly reactive. No other epidural involvement of tumor. 4. Trace layering bilateral pleural effusions. Electronically Signed   By: Rise Mu M.D.   On: 12/29/2022 22:24   CT CHEST ABDOMEN PELVIS W CONTRAST  Result Date: 12/29/2022 CLINICAL DATA:  History of metastatic breast cancer presenting with back pain. EXAM: CT CHEST, ABDOMEN, AND PELVIS WITH CONTRAST TECHNIQUE: Multidetector CT imaging of the chest, abdomen and pelvis was performed following the standard protocol during bolus administration of intravenous contrast. RADIATION DOSE REDUCTION: This exam  was performed according to the departmental dose-optimization program which includes automated exposure control, adjustment of the mA and/or kV according to patient size and/or use of iterative reconstruction technique. CONTRAST:  OMNIPAQUE IOHEXOL 300 MG/ML  SOLN COMPARISON:  November 18, 2022 FINDINGS: CT CHEST FINDINGS Cardiovascular: No significant vascular findings. Normal heart size. No pericardial effusion. Mediastinum/Nodes: No enlarged mediastinal, hilar, or axillary lymph nodes. Thyroid gland, trachea, and esophagus demonstrate no significant findings. Lungs/Pleura: Mild anteromedial right upper lobe and bilateral lower lobe linear atelectasis is seen. There is no evidence of acute infiltrate, pleural effusion or pneumothorax. Musculoskeletal: A stable 2.0 cm x 1.7 cm soft tissue mass is seen within the upper outer quadrant of the right breast. An associated biopsy clip is noted. Innumerable lytic and sclerotic lesions are seen throughout the visualized osseous structures. A mild, acute compression fracture deformity is seen involving the T10 vertebral body. Associated mild to moderate severity paravertebral soft tissue swelling is noted (axial CT images 46 through 52, CT series 2). This represents a new finding when compared to the prior exam. CT ABDOMEN PELVIS FINDINGS Hepatobiliary: There is diffuse fatty infiltration of the liver parenchyma. No focal liver abnormality is seen. No gallstones, gallbladder wall thickening, or biliary dilatation. Pancreas: Unremarkable. No pancreatic ductal dilatation or surrounding inflammatory changes. Spleen: Normal in size without focal abnormality. Adrenals/Urinary Tract: Adrenal glands are unremarkable. Kidneys are normal in size, without renal calculi or hydronephrosis. A 2 mm nonobstructing renal calculus is seen within the mid left kidney. A 9 mm diameter simple cyst is seen within the anterior aspect of the mid to upper right kidney. Bladder is  unremarkable. Stomach/Bowel: There is a small hiatal hernia. The appendix is not identified. No evidence of bowel wall thickening, distention, or inflammatory changes. Vascular/Lymphatic: No significant vascular findings are present. No enlarged abdominal or pelvic lymph nodes. Reproductive: A 2.5 cm diameter ill-defined uterine fibroid suspected within the uterine fundus. The bilateral adnexa are unremarkable. Other: No abdominal wall hernia or abnormality. No abdominopelvic ascites. Musculoskeletal: A metallic density intramedullary rod and compression screw device are seen within the proximal right femur. Innumerable lytic and sclerotic lesions are again seen throughout the spine and pelvis. IMPRESSION: 1. Acute compression fracture deformity of the T10 vertebral body. MRI correlation is recommended. 2. Innumerable lytic and sclerotic lesions throughout the visualized osseous structures, consistent with osseous metastasis. 3. Stable right breast soft tissue mass, as described above. Correlation with biopsy results is recommended. 4. Hepatic steatosis. 5. 2 mm nonobstructing left renal calculus. 6. Small hiatal hernia. 7. Prior open reduction and internal fixation of the proximal right femur. Electronically Signed   By: Aram Candela M.D.   On: 12/29/2022 20:08   MM DIAG BREAST TOMO UNI RIGHT  Result Date: 12/17/2022 CLINICAL DATA:  Increased size of a mass on CT representing invasive ductal carcinoma and DCIS in the 10 o'clock position of the right breast on ultrasound-guided core needle biopsy dated 07/21/2022. The CT  was dated 11/18/2022. Status post left lumpectomy for breast cancer in 2021. Bone metastases. She is currently undergoing antiestrogen therapy. EXAM: DIGITAL DIAGNOSTIC UNILATERAL RIGHT MAMMOGRAM WITH TOMOSYNTHESIS; ULTRASOUND RIGHT BREAST LIMITED TECHNIQUE: Right digital diagnostic mammography and breast tomosynthesis was performed.; Targeted ultrasound examination of the right breast was  performed COMPARISON:  Previous exam(s). ACR Breast Density Category c: The breasts are heterogeneously dense, which may obscure small masses. FINDINGS: The biopsy-proven malignancy containing a biopsy marker clip in the upper-outer right breast appears slightly larger and more confluent in the oblique projection compared to the previous examination dated 07/21/2022, currently measuring 2.0 cm in maximum diameter. This measured approximately 1.6 cm in maximum diameter mammographically on 07/15/2022. It is difficult to compare the size in the craniocaudal projection due to obscured margins. No interval findings elsewhere in the right breast suspicious for malignancy. Targeted ultrasound is performed, showing a 1.9 x 1.9 x 1.8 cm irregular, hypoechoic mass with ill-defined surrounding increased echogenicity and indistinct margins in the 10 o'clock position of the right breast, 5 cm from the nipple. This contains a biopsy marker clip, corresponding to the patient's known malignancy. This measured 1.8 x 1.6 x 1.4 cm on 07/15/2022. Ultrasound of the right axilla demonstrated normal appearing right axillary lymph nodes IMPRESSION: Interval mild increase in size of the biopsy-proven malignancy in the 10 o'clock position of the right breast, currently measuring 1.9 cm in maximum diameter. RECOMMENDATION: Treatment plan. I have discussed the findings and recommendations with the patient. If applicable, a reminder letter will be sent to the patient regarding the next appointment. BI-RADS CATEGORY  6: Known biopsy-proven malignancy. Electronically Signed   By: Beckie Salts M.D.   On: 12/17/2022 10:04  [place] LIMITED ULTRASOUND INCLUDING AXILLA RIGHT BREAST  Result Date: 12/17/2022 CLINICAL DATA:  Increased size of a mass on CT representing invasive ductal carcinoma and DCIS in the 10 o'clock position of the right breast on ultrasound-guided core needle biopsy dated 07/21/2022. The CT was dated 11/18/2022. Status post left  lumpectomy for breast cancer in 2021. Bone metastases. She is currently undergoing antiestrogen therapy. EXAM: DIGITAL DIAGNOSTIC UNILATERAL RIGHT MAMMOGRAM WITH TOMOSYNTHESIS; ULTRASOUND RIGHT BREAST LIMITED TECHNIQUE: Right digital diagnostic mammography and breast tomosynthesis was performed.; Targeted ultrasound examination of the right breast was performed COMPARISON:  Previous exam(s). ACR Breast Density Category c: The breasts are heterogeneously dense, which may obscure small masses. FINDINGS: The biopsy-proven malignancy containing a biopsy marker clip in the upper-outer right breast appears slightly larger and more confluent in the oblique projection compared to the previous examination dated 07/21/2022, currently measuring 2.0 cm in maximum diameter. This measured approximately 1.6 cm in maximum diameter mammographically on 07/15/2022. It is difficult to compare the size in the craniocaudal projection due to obscured margins. No interval findings elsewhere in the right breast suspicious for malignancy. Targeted ultrasound is performed, showing a 1.9 x 1.9 x 1.8 cm irregular, hypoechoic mass with ill-defined surrounding increased echogenicity and indistinct margins in the 10 o'clock position of the right breast, 5 cm from the nipple. This contains a biopsy marker clip, corresponding to the patient's known malignancy. This measured 1.8 x 1.6 x 1.4 cm on 07/15/2022. Ultrasound of the right axilla demonstrated normal appearing right axillary lymph nodes IMPRESSION: Interval mild increase in size of the biopsy-proven malignancy in the 10 o'clock position of the right breast, currently measuring 1.9 cm in maximum diameter. RECOMMENDATION: Treatment plan. I have discussed the findings and recommendations with the patient. If applicable, a reminder letter will  be sent to the patient regarding the next appointment. BI-RADS CATEGORY  6: Known biopsy-proven malignancy. Electronically Signed   By: Beckie Salts M.D.    On: 12/17/2022 10:04   I spent at least 85 minutes for this patient encounter including review of prior medical records/discussing diagnostics and treatment plan with the patient/family/coordinate care with primary/other specialits with greater than 50% of time in face to face encounter.   Electronically signed by:   Odette Fraction, MD Infectious Disease Physician [facility] Pager: [phone]

## 2022-12-30 NOTE — Progress Notes (Signed)
Patient arrived to the unit with belongings. Patient alert. Vitals and assessment completed.

## 2022-12-31 ENCOUNTER — Other Ambulatory Visit (HOSPITAL_COMMUNITY): Payer: Self-pay

## 2022-12-31 ENCOUNTER — Inpatient Hospital Stay: Payer: 59 | Attending: Hematology and Oncology | Admitting: Hematology and Oncology

## 2022-12-31 DIAGNOSIS — C50212 Malignant neoplasm of upper-inner quadrant of left female breast: Secondary | ICD-10-CM

## 2022-12-31 DIAGNOSIS — C50919 Malignant neoplasm of unspecified site of unspecified female breast: Secondary | ICD-10-CM

## 2022-12-31 DIAGNOSIS — M545 Low back pain, unspecified: Secondary | ICD-10-CM

## 2022-12-31 DIAGNOSIS — C7951 Secondary malignant neoplasm of bone: Secondary | ICD-10-CM | POA: Diagnosis not present

## 2022-12-31 DIAGNOSIS — Z170 Estrogen receptor positive status [ER+]: Secondary | ICD-10-CM

## 2022-12-31 DIAGNOSIS — M4620 Osteomyelitis of vertebra, site unspecified: Secondary | ICD-10-CM | POA: Diagnosis not present

## 2022-12-31 DIAGNOSIS — G893 Neoplasm related pain (acute) (chronic): Secondary | ICD-10-CM | POA: Diagnosis not present

## 2022-12-31 DIAGNOSIS — Z515 Encounter for palliative care: Secondary | ICD-10-CM | POA: Diagnosis not present

## 2022-12-31 DIAGNOSIS — M8458XA Pathological fracture in neoplastic disease, other specified site, initial encounter for fracture: Secondary | ICD-10-CM | POA: Diagnosis not present

## 2022-12-31 LAB — CBC
HCT: 35.4 % — ABNORMAL LOW (ref 36.0–46.0)
Hemoglobin: 11.5 g/dL — ABNORMAL LOW (ref 12.0–15.0)
MCH: 28.8 pg (ref 26.0–34.0)
MCHC: 32.5 g/dL (ref 30.0–36.0)
MCV: 88.5 fL (ref 80.0–100.0)
Platelets: 266 10*3/uL (ref 150–400)
RBC: 4 MIL/uL (ref 3.87–5.11)
RDW: 14 % (ref 11.5–15.5)
WBC: 5.3 10*3/uL (ref 4.0–10.5)
nRBC: 0 % (ref 0.0–0.2)

## 2022-12-31 LAB — COMPREHENSIVE METABOLIC PANEL
ALT: 24 U/L (ref 0–44)
AST: 36 U/L (ref 15–41)
Albumin: 3.4 g/dL — ABNORMAL LOW (ref 3.5–5.0)
Alkaline Phosphatase: 284 U/L — ABNORMAL HIGH (ref 38–126)
Anion gap: 9 (ref 5–15)
BUN: 6 mg/dL (ref 6–20)
CO2: 22 mmol/L (ref 22–32)
Calcium: 9.3 mg/dL (ref 8.9–10.3)
Chloride: 107 mmol/L (ref 98–111)
Creatinine, Ser: 0.81 mg/dL (ref 0.44–1.00)
GFR, Estimated: >60 mL/min (ref >60)
Glucose, Bld: 99 mg/dL (ref 70–99)
Potassium: 4 mmol/L (ref 3.5–5.1)
Sodium: 138 mmol/L (ref 135–145)
Total Bilirubin: 0.3 mg/dL (ref 0.3–1.2)
Total Protein: 7.1 g/dL (ref 6.5–8.1)

## 2022-12-31 LAB — SEDIMENTATION RATE: Sed Rate: 69 mm/hr — ABNORMAL HIGH (ref 0–22)

## 2022-12-31 LAB — C-REACTIVE PROTEIN: CRP: 7.8 mg/dL — ABNORMAL HIGH (ref <1.0)

## 2022-12-31 LAB — HIV ANTIBODY (ROUTINE TESTING W REFLEX): HIV Screen 4th Generation wRfx: NONREACTIVE

## 2022-12-31 MED ORDER — DOCUSATE SODIUM 100 MG PO CAPS
100.0000 mg | ORAL_CAPSULE | Freq: Two times a day (BID) | ORAL | 0 refills | Status: DC
  Filled 2022-12-31: qty 10, 5d supply, fill #0
  Filled 2023-01-05 – 2023-02-18 (×2): qty 100, 50d supply, fill #0

## 2022-12-31 MED ORDER — POLYETHYLENE GLYCOL 3350 17 G PO PACK
17.0000 g | PACK | Freq: Two times a day (BID) | ORAL | 0 refills | Status: DC
  Filled 2022-12-31: qty 14, 7d supply, fill #0

## 2022-12-31 MED ORDER — BISACODYL 5 MG PO TBEC
5.0000 mg | DELAYED_RELEASE_TABLET | Freq: Every day | ORAL | 0 refills | Status: DC | PRN
  Filled 2022-12-31: qty 30, 30d supply, fill #0

## 2022-12-31 MED ORDER — BISACODYL 10 MG RE SUPP: 10.0000 mg | Freq: Once | RECTAL | Status: DC

## 2022-12-31 MED ORDER — BISACODYL 10 MG RE SUPP
10.0000 mg | Freq: Every day | RECTAL | 0 refills | Status: DC | PRN
  Filled 2022-12-31 – 2023-01-05 (×2): qty 12, 12d supply, fill #0

## 2022-12-31 MED ORDER — METHOCARBAMOL 750 MG PO TABS
750.0000 mg | ORAL_TABLET | Freq: Three times a day (TID) | ORAL | 0 refills | Status: AC | PRN
  Filled 2022-12-31: qty 90, 30d supply, fill #0

## 2022-12-31 NOTE — Progress Notes (Signed)
Palliative Medicine Progress Note   Patient Name: Natalie Griffin       Date: 12/31/2022 DOB: 11-01-1975  Age: 47 y.o. MRN#: [id number] Attending Physician: Lanae Boast, MD Primary Care Physician: Charlane Ferretti, DO Admit Date: 12/29/2022    HPI/Patient Profile: 47 y.o. female  with past medical history of metastatic breast cancer with extensive osseous metastasis, anxiety, and anemia of chronic disease who presented to [facility] ED on 12/29/2022 with right flank and back pain.  MRI of the lumbar spine showed compression fractures of T12, L2, and L4 with no obvious abscess but diffuse metastatic disease.  MRI of the thoracic spine showed pathologic fractures involving multiple levels including T4-T5, T10, and T12 with paraspinous edema along the anterior margin of the spinal column, which could reflect extra osseous extension of tumor versus superimposed infection with osteomyelitis.  Neurosurgery and infectious disease were consulted.   Palliative Medicine has been consulted for goals of care and symptom management in the setting of metastatic cancer.    Subjective: Chart reviewed and patient assessed at bedside. She reports her pain is significantly improved; she feels this is due to the robaxin. She has not yet had a BM. Note that attending MD has ordered abisacodyl suppository. Jeani is hopeful to discharge home today after she has a BM.    Education and counseling provided advanced directives. Nimo has decided she would like to designate her friend Roxy Manns as her health care agent and her sister [name] as alternate. She also would like to complete a living will, emphasizing her wish not to have any life-prolonging interventions.   Discussed the option for outpatient palliative at [facility] for ongoing support and symptom management. Stefan would like to proceed with this referral.     Objective:  Physical Exam Vitals reviewed.  Pulmonary:     Effort: Pulmonary effort is normal.  Neurological:     Mental Status: She is alert and oriented to person, place, and time.  Psychiatric:        Behavior: Behavior normal.             Vital Signs: BP 131/86 (BP Location: Right Arm)   Pulse 76   Temp 98.5 F (36.9 C) (Oral)   Resp 20   Wt 79 kg   SpO2 97%  BMI 28.98 kg/m  SpO2: SpO2: 97 % O2 Device: O2 Device: Room Air    Palliative Medicine Assessment & Plan   Assessment: Principal Problem:   Spinal abscess Active Problems:   Malignant neoplasm of upper-inner quadrant of left breast in female, estrogen receptor positive   Pathological fracture of right hip   Closed wedge compression fracture of T10 vertebra   Hypokalemia    Recommendations/Plan: Spiritual care referral for assistance with advanced directives Referral to outpatient palliative at [facility] Pending discharge home today   Symptom Management: Back pain and muscle spasms Continue MS Contin 30 mg every 12 hours Continue oxycodone-acetaminophen 5-325 every 8 hours as needed for breakthrough pain Continue Robaxin 500 mg every 6 hours as needed for muscle spasms Consider starting dexamethasone 4 mg daily 2. Constipation Continue miralax 2 times daily  Continue biascodyl 5 mg daily PRN bisacodyl suppository today x 1  Code Status: DNR/DNI    Thank you for allowing the Palliative Medicine Team to assist in the care of this patient.   MDM - High   Merry Proud, NP   Please contact Palliative Medicine Team phone at [phone] for questions and concerns.  For individual providers, please see AMION.

## 2022-12-31 NOTE — Discharge Summary (Signed)
Physician Discharge Summary  Natalie Griffin ZOX:[name] DOB: 06-May-1976 DOA: 12/29/2022  PCP: Natalie Griffin, Natalie Griffin  Admit date: 12/29/2022 Discharge date: 12/31/2022 Recommendations for Outpatient Follow-up:  Follow up with PCP in 1 weeks-call for appointment Please obtain BMP/CBC in one week  Discharge Dispo: HOME Discharge Condition: Stable Code Status:   Code Status: DNR Diet recommendation:  Diet Order             Diet regular Room service appropriate? Yes; Fluid consistency: Thin  Diet effective now                  Brief/Interim Summary:46 y.o.f w/ history significant of metastatic breast cancer with extensive osseous metastasis-who apparently opted not to go to chemotherapy, anxiety disorder, anemia of chronic disease, not on chemotherapy as outpatient presented to hospital with right flank and back pain fever, urinary incontinence.  In ED; NO leukocytosis.  Lipase 26.  UA-some proteinuria, mild hypokalemia,Urine pregnancy test was negative.  Lactate was 1.0.  CT of the abdomen and pelvis showed T12 compression fracture. MRI of the lumbar spine showed compression fractures of the T12, L2 and L4.  No obvious abscess but multiple diffuse metastatic disease.  MRI of the thoracic spine showed pathologic fractures involving multiple levels and colluding T4-T5 T10 and T12 vertebral bodies with paraspinous edema and enhancement along the anterior margin of the spinal column.Possibly extraosseous extension of tumor.  Patient has superimposed infection with osteomyelitis and the paraspinous phlegmonous change suspected in the area.  But no loculated effusion.  There was trace layering of bilateral pleural effusions.  Case discussed with neurosurgery and infectious disease and admitted for further management  At this time seen by ID received antibiotics x 48 hours, and discontinued since blood cultures negative, less likely spinal infection likely in the setting of patient's metastatic  disease. Patient to follow-up with outpatient oncology   Discharge Diagnoses:  Principal Problem:   Spinal abscess Active Problems:   Malignant neoplasm of upper-inner quadrant of left breast in female, estrogen receptor positive   Pathological fracture of right hip   Closed wedge compression fracture of T10 vertebra   Hypokalemia  Metastatic breast cancer to the bones with questionable discitis and osteomyelitis Multiple compression fractures: Per IR I MRI findings are more/more reactive edema and related to known bony mets, less likely infection related.  Refused aspiration versus biopsy to rule out any water infection.  ID IR following, patient is placed on empiric antibiotics- blod culture negative in 48 hours -and ID has discontinued antibiotics.  No further procedures planned.  Continue pain management with Dilaudid and MS Contin and Percocet.Of note patient follows up with Dr. Pamelia Griffin oncology as outpatient and has refused chemotherapy as outpatient.Patient did have palliative radiation to the iliac bone and hip in the past.Status post oophorectomy 09/30/2022, currently on anastrozole.  Palliative care has been consulted.  Constipation in the setting of opiate use: Continue aggressive bowel regimen, Dulcolax suppository ordered use of enema if needed prior to discharge.  Multiple compression fractures of the spine:secondary to pathological fracture from metastasis.Pain management, spoke with neurosurgery at baseline.  Neurosurgery has offered brace which she is thinking about it.  Neurosurgery recommended ID consultation and biopsy but patient has refused biopsy from the area.  Radiology however thinks that the findings could be more related to metastatic tumor.   Hypokalemia: Resolved    Anxiety disorder: Continue Xanax.   Goals of care:Patient has intractable pain and metastatic cancer.  Palliative care was consulted  Consults: ID PMT NEUROSURGERY  Subjective: AAOX3, resting  well  Discharge Exam: Vitals:   12/31/22 0823 12/31/22 1209  BP: 123/89 131/86  Pulse: 86 76  Resp: 18 20  Temp: 98.6 F (37 C) 98.5 F (36.9 C)  SpO2: 95% 97%   General: Pt is alert, awake, not in acute distress Cardiovascular: RRR, S1/S2 +, no rubs, no gallops Respiratory: CTA bilaterally, no wheezing, no rhonchi Abdominal: Soft, NT, ND, bowel sounds + Extremities: no edema, no cyanosis  Discharge Instructions  Discharge Instructions     Discharge instructions   Complete by: As directed    Please call call MD or return to ER for similar or worsening recurring problem that brought you to hospital or if any fever,nausea/vomiting,abdominal pain, uncontrolled pain, chest pain,  shortness of breath or any other alarming symptoms.  Please follow-up your doctor as instructed in a week time and call the office for appointment.  Please avoid alcohol, smoking, or any other illicit substance and maintain healthy habits including taking your regular medications as prescribed.  You were cared for by a hospitalist during your hospital stay. If you have any questions about your discharge medications or the care you received while you were in the hospital after you are discharged, you can call the unit and ask to speak with the hospitalist on call if the hospitalist that took care of you is not available.  Once you are discharged, your primary care physician will handle any further medical issues. Please note that NO REFILLS for any discharge medications will be authorized once you are discharged, as it is imperative that you return to your primary care physician (or establish a relationship with a primary care physician if you Griffin not have one) for your aftercare needs so that they can reassess your need for medications and monitor your lab values   Increase activity slowly   Complete by: As directed       Allergies as of 12/31/2022       Reactions   Adhesive [tape] Other (See Comments)    Blistering with steri strips   Fentanyl Itching, Nausea Only, Rash   08/11/2022 reports removing patch two days ago. Experienced "rash, itching, nausea, headache and breathing" affected.      Phesgo [pertuz-trastuz-hyaluron-zzxf] Diarrhea, Rash   Phesgo side effect: Fixed drug eruption, profound diarrhea Therefore we will discontinue Phesgo and start her on Herceptin Hylecta Inj   Tetanus Toxoid Anaphylaxis   Succinylcholine Other (See Comments)   Her Body does not have enough enzymes to carry it out of her body        Medication List     TAKE these medications    ALPRAZolam 0.5 MG tablet Commonly known as: XANAX Take 1 tablet (0.5 mg total) by mouth 3 (three) times daily as needed for anxiety.   anastrozole 1 MG tablet Commonly known as: ARIMIDEX Take 1 tablet (1 mg total) by mouth daily.   bisacodyl 5 MG EC tablet Commonly known as: DULCOLAX Take 1 tablet (5 mg total) by mouth daily as needed for moderate constipation.   bisacodyl 10 MG suppository Commonly known as: DULCOLAX Place 1 suppository (10 mg total) rectally daily as needed for up to 7 doses for moderate constipation.   calcium carbonate 600 MG Tabs tablet Commonly known as: OS-CAL Take 600 mg by mouth in the morning and at bedtime.   docusate sodium 100 MG capsule Commonly known as: COLACE Take 1 capsule (100 mg total) by mouth 2 (two)  times daily.   ferrous sulfate 324 MG Tbec Take 324 mg by mouth daily in the afternoon.   methocarbamol 750 MG tablet Commonly known as: ROBAXIN Take 1 tablet (750 mg total) by mouth 3 (three) times daily as needed for muscle spasms.   morphine 30 MG 12 hr tablet Commonly known as: MS CONTIN Take 1 tablet (30 mg total) by mouth every 12 (twelve) hours.   multivitamin with minerals tablet Take 1 tablet by mouth in the morning.   oxyCODONE-acetaminophen 10-325 MG tablet Commonly known as: Percocet Take 1 tablet by mouth every 8 (eight) hours as needed for pain.    polyethylene glycol 17 g packet Commonly known as: MIRALAX / GLYCOLAX Take 17 g by mouth 2 (two) times daily.   predniSONE 50 MG tablet Commonly known as: DELTASONE 1 tablet 13 hrs prior, 1 tablet 7 hours prior and 1 tablet 1 hr prior to scan.   Vitamin D (Cholecalciferol) 25 MCG (1000 UT) Tabs Take 1 tablet by mouth daily.        Follow-up Information     Natalie Ferretti, Griffin Follow up in 1 week(s).   Specialty: Internal Medicine Contact information: [address] [place] [address] [phone]                Allergies  Allergen Reactions   Adhesive [Tape] Other (See Comments)    Blistering with steri strips   Fentanyl Itching, Nausea Only and Rash    08/11/2022 reports removing patch two days ago. Experienced "rash, itching, nausea, headache and breathing" affected.      Phesgo [Pertuz-Trastuz-Hyaluron-Zzxf] Diarrhea and Rash    Phesgo side effect: Fixed drug eruption, profound diarrhea Therefore we will discontinue Phesgo and start her on Herceptin Hylecta Inj   Tetanus Toxoid Anaphylaxis   Succinylcholine Other (See Comments)    Her Body does not have enough enzymes to carry it out of her body    The results of significant diagnostics from this hospitalization (including imaging, microbiology, ancillary and laboratory) are listed below for reference.    Microbiology: Recent Results (from the past 240 hour(s))  Blood culture (routine x 2)     Status: None (Preliminary result)   Collection Time: 12/29/22  6:10 PM   Specimen: BLOOD  Result Value Ref Range Status   Specimen Description   Final    BLOOD BLOOD RIGHT FOREARM Performed at [facility], [address], [place], [place] [address]    Special Requests   Final    BOTTLES DRAWN AEROBIC AND ANAEROBIC Blood Culture results may not be optimal due to an excessive volume of blood received in culture bottles Performed at [facility], [address]  [place], [place] [address]    Culture   Final    NO GROWTH 2 DAYS Performed at [facility], [address] [place], [place] [address]    Report Status PENDING  Incomplete  Blood culture (routine x 2)     Status: None (Preliminary result)   Collection Time: 12/29/22  7:55 PM   Specimen: BLOOD LEFT HAND  Result Value Ref Range Status   Specimen Description   Final    BLOOD LEFT HAND Performed at [facility], [address] [place], [place] [address]    Special Requests   Final    BOTTLES DRAWN AEROBIC AND ANAEROBIC Blood Culture results may not be optimal due to an excessive volume of blood received in culture bottles Performed at  [facility], [address], [place], [place] [address]    Culture   Final    NO GROWTH 2 DAYS Performed at [facility], [address], [place], [place] [address]    Report Status PENDING  Incomplete    Procedures/Studies: MR Lumbar Spine W Wo Contrast  Result Date: 12/29/2022 CLINICAL DATA:  Initial evaluation for back pain, history of metastatic disease. Evaluate for infection. EXAM: MRI LUMBAR SPINE WITHOUT AND WITH CONTRAST TECHNIQUE: Multiplanar and multiecho pulse sequences of the lumbar spine were obtained without and with intravenous contrast. CONTRAST:  7.47mL GADAVIST GADOBUTROL 1 MMOL/ML IV SOLN COMPARISON:  Prior radiograph from 07/16/2022. FINDINGS: Segmentation: Standard. Lowest well-formed disc space labeled the L5-S1 level. Alignment: Physiologic with preservation of the normal lumbar lordosis. No listhesis. Vertebrae: Widespread osseous metastatic disease seen throughout the lumbar spine and visualized sacrum/pelvis. There is involvement of essentially all levels. Associated pathologic compression fractures involving the superior endplates of T12, L2, and L4 noted. Abnormal posterior convex bowing of the vertebral body cortex seen at L1 and L4. No significant extra osseous or epidural extension of tumor at this  time. No MRI evidence for superimposed infection. Conus medullaris and cauda equina: Conus extends to the T12 level. Conus and cauda equina appear normal. No abnormal enhancement. Paraspinal and other soft tissues: Mild edema within the lower posterior paraspinous musculature, nonspecific, but could reflect a degree of muscular injury/strain. This is not felt to be related to infection given overall appearance. No other paraspinous inflammatory changes. No collections. Disc levels: L1-2: Disc desiccation without significant disc bulge. No stenosis. L2-3:  Unremarkable. L3-4: Degenerative intervertebral disc space narrowing with disc desiccation and mild disc bulge. Mild facet spurring. No significant spinal stenosis. Foramina remain patent. L4-5: Negative interspace. Mild facet hypertrophy. No significant spinal stenosis. Foramina remain patent. L5-S1: Negative interspace. No significant canal or foraminal stenosis. IMPRESSION: 1. Widespread osseous metastatic disease throughout the lumbar spine and visualized sacrum/pelvis. No significant extra osseous or epidural extension of tumor at this time. No MRI evidence for superimposed infection. 2. Associated pathologic compression fractures involving the superior endplates of T12, L2, and L4. 3. Mild edema within the lower posterior paraspinous musculature, nonspecific, but could reflect muscular injury and/or strain. 4. Underlying mild for age spondylosis without significant stenosis or overt neural impingement. Electronically Signed   By: Rise Mu M.D.   On: 12/29/2022 22:35   MR THORACIC SPINE W WO CONTRAST  Result Date: 12/29/2022 CLINICAL DATA:  Initial evaluation for thoracic osteomyelitis. EXAM: MRI THORACIC WITHOUT AND WITH CONTRAST TECHNIQUE: Multiplanar and multiecho pulse sequences of the thoracic spine were obtained without and with intravenous contrast. CONTRAST:  7.27mL GADAVIST GADOBUTROL 1 MMOL/ML IV SOLN COMPARISON:  Prior study from  02/03/2022. FINDINGS: Alignment: Vertebral bodies normally aligned with preservation of the normal thoracic kyphosis. No listhesis. Vertebrae: Widespread osseous metastatic disease seen throughout the thoracic spine. There is involvement of essentially all levels, although involvement is most pronounced at T8 through T12. Pathologic compression fractures involving the T4, T5, T10, and T12 vertebral bodies noted height loss at T9 and T10 is mildly progressed as compared to previous MRI from 02/03/2022. Abnormal paraspinous edema and enhancement seen along the anterior margin of the spinal column at T9 through T11-12 (series 26, image 7). While this finding could reflect extra osseous extension of tumor, superimposed infection with osteomyelitis and paraspinous phlegmonous change could be considered in the correct clinical setting. No loculated collections. Mild smooth epidural enhancement seen extending  from T9 through T11 as well, possibly reactive. No other epidural involvement of tumor. No other paraspinous inflammatory changes. Cord: Normal signal and morphology. Mild smooth epidural enhancement at T9 through T11-12. No other abnormal enhancement or significant epidural involvement. Paraspinal and other soft tissues: Paraspinous edema adjacent to the T9 through T11 12 levels as above. Paraspinous soft tissues demonstrate no other acute finding. Trace layering bilateral pleural effusions. Disc levels: Underlying mild for age spondylosis. No significant disc bulge or focal disc herniation. No spinal stenosis. Foramina remain patent. IMPRESSION: 1. Widespread osseous metastatic disease throughout the thoracic spine. Pathologic compression fractures involving the T4, T5, T10, and T12 vertebral bodies. Mildly progressive height loss seen about the T10 fracture as compared to previous MRI from 02/03/2022. 2. Abnormal paraspinous edema and enhancement along the anterior margin of the spinal column at T9 through T11-12.  Finding is nonspecific, and could reflect extra osseous extension of tumor. Superimposed infection with osteomyelitis and paraspinous phlegmonous change difficult to exclude, and could be considered in the correct clinical setting. No loculated collections. 3. Mild smooth epidural enhancement extending from T9 through T11, possibly reactive. No other epidural involvement of tumor. 4. Trace layering bilateral pleural effusions. Electronically Signed   By: Rise Mu M.D.   On: 12/29/2022 22:24   CT CHEST ABDOMEN PELVIS W CONTRAST  Result Date: 12/29/2022 CLINICAL DATA:  History of metastatic breast cancer presenting with back pain. EXAM: CT CHEST, ABDOMEN, AND PELVIS WITH CONTRAST TECHNIQUE: Multidetector CT imaging of the chest, abdomen and pelvis was performed following the standard protocol during bolus administration of intravenous contrast. RADIATION DOSE REDUCTION: This exam was performed according to the departmental dose-optimization program which includes automated exposure control, adjustment of the mA and/or kV according to patient size and/or use of iterative reconstruction technique. CONTRAST:  OMNIPAQUE IOHEXOL 300 MG/ML  SOLN COMPARISON:  November 18, 2022 FINDINGS: CT CHEST FINDINGS Cardiovascular: No significant vascular findings. Normal heart size. No pericardial effusion. Mediastinum/Nodes: No enlarged mediastinal, hilar, or axillary lymph nodes. Thyroid gland, trachea, and esophagus demonstrate no significant findings. Lungs/Pleura: Mild anteromedial right upper lobe and bilateral lower lobe linear atelectasis is seen. There is no evidence of acute infiltrate, pleural effusion or pneumothorax. Musculoskeletal: A stable 2.0 cm x 1.7 cm soft tissue mass is seen within the upper outer quadrant of the right breast. An associated biopsy clip is noted. Innumerable lytic and sclerotic lesions are seen throughout the visualized osseous structures. A mild, acute compression fracture  deformity is seen involving the T10 vertebral body. Associated mild to moderate severity paravertebral soft tissue swelling is noted (axial CT images 46 through 52, CT series 2). This represents a new finding when compared to the prior exam. CT ABDOMEN PELVIS FINDINGS Hepatobiliary: There is diffuse fatty infiltration of the liver parenchyma. No focal liver abnormality is seen. No gallstones, gallbladder wall thickening, or biliary dilatation. Pancreas: Unremarkable. No pancreatic ductal dilatation or surrounding inflammatory changes. Spleen: Normal in size without focal abnormality. Adrenals/Urinary Tract: Adrenal glands are unremarkable. Kidneys are normal in size, without renal calculi or hydronephrosis. A 2 mm nonobstructing renal calculus is seen within the mid left kidney. A 9 mm diameter simple cyst is seen within the anterior aspect of the mid to upper right kidney. Bladder is unremarkable. Stomach/Bowel: There is a small hiatal hernia. The appendix is not identified. No evidence of bowel wall thickening, distention, or inflammatory changes. Vascular/Lymphatic: No significant vascular findings are present. No enlarged abdominal or pelvic lymph nodes. Reproductive: A 2.5  cm diameter ill-defined uterine fibroid suspected within the uterine fundus. The bilateral adnexa are unremarkable. Other: No abdominal wall hernia or abnormality. No abdominopelvic ascites. Musculoskeletal: A metallic density intramedullary rod and compression screw device are seen within the proximal right femur. Innumerable lytic and sclerotic lesions are again seen throughout the spine and pelvis. IMPRESSION: 1. Acute compression fracture deformity of the T10 vertebral body. MRI correlation is recommended. 2. Innumerable lytic and sclerotic lesions throughout the visualized osseous structures, consistent with osseous metastasis. 3. Stable right breast soft tissue mass, as described above. Correlation with biopsy results is recommended. 4.  Hepatic steatosis. 5. 2 mm nonobstructing left renal calculus. 6. Small hiatal hernia. 7. Prior open reduction and internal fixation of the proximal right femur. Electronically Signed   By: Aram Candela M.D.   On: 12/29/2022 20:08   MM DIAG BREAST TOMO UNI RIGHT  Result Date: 12/17/2022 CLINICAL DATA:  Increased size of a mass on CT representing invasive ductal carcinoma and DCIS in the 10 o'clock position of the right breast on ultrasound-guided core needle biopsy dated 07/21/2022. The CT was dated 11/18/2022. Status post left lumpectomy for breast cancer in 2021. Bone metastases. She is currently undergoing antiestrogen therapy. EXAM: DIGITAL DIAGNOSTIC UNILATERAL RIGHT MAMMOGRAM WITH TOMOSYNTHESIS; ULTRASOUND RIGHT BREAST LIMITED TECHNIQUE: Right digital diagnostic mammography and breast tomosynthesis was performed.; Targeted ultrasound examination of the right breast was performed COMPARISON:  Previous exam(s). ACR Breast Density Category c: The breasts are heterogeneously dense, which may obscure small masses. FINDINGS: The biopsy-proven malignancy containing a biopsy marker clip in the upper-outer right breast appears slightly larger and more confluent in the oblique projection compared to the previous examination dated 07/21/2022, currently measuring 2.0 cm in maximum diameter. This measured approximately 1.6 cm in maximum diameter mammographically on 07/15/2022. It is difficult to compare the size in the craniocaudal projection due to obscured margins. No interval findings elsewhere in the right breast suspicious for malignancy. Targeted ultrasound is performed, showing a 1.9 x 1.9 x 1.8 cm irregular, hypoechoic mass with ill-defined surrounding increased echogenicity and indistinct margins in the 10 o'clock position of the right breast, 5 cm from the nipple. This contains a biopsy marker clip, corresponding to the patient's known malignancy. This measured 1.8 x 1.6 x 1.4 cm on 07/15/2022.  Ultrasound of the right axilla demonstrated normal appearing right axillary lymph nodes IMPRESSION: Interval mild increase in size of the biopsy-proven malignancy in the 10 o'clock position of the right breast, currently measuring 1.9 cm in maximum diameter. RECOMMENDATION: Treatment plan. I have discussed the findings and recommendations with the patient. If applicable, a reminder letter will be sent to the patient regarding the next appointment. BI-RADS CATEGORY  6: Known biopsy-proven malignancy. Electronically Signed   By: Beckie Salts M.D.   On: 12/17/2022 10:04  [place] LIMITED ULTRASOUND INCLUDING AXILLA RIGHT BREAST  Result Date: 12/17/2022 CLINICAL DATA:  Increased size of a mass on CT representing invasive ductal carcinoma and DCIS in the 10 o'clock position of the right breast on ultrasound-guided core needle biopsy dated 07/21/2022. The CT was dated 11/18/2022. Status post left lumpectomy for breast cancer in 2021. Bone metastases. She is currently undergoing antiestrogen therapy. EXAM: DIGITAL DIAGNOSTIC UNILATERAL RIGHT MAMMOGRAM WITH TOMOSYNTHESIS; ULTRASOUND RIGHT BREAST LIMITED TECHNIQUE: Right digital diagnostic mammography and breast tomosynthesis was performed.; Targeted ultrasound examination of the right breast was performed COMPARISON:  Previous exam(s). ACR Breast Density Category c: The breasts are heterogeneously dense, which may obscure small masses. FINDINGS: The biopsy-proven malignancy  containing a biopsy marker clip in the upper-outer right breast appears slightly larger and more confluent in the oblique projection compared to the previous examination dated 07/21/2022, currently measuring 2.0 cm in maximum diameter. This measured approximately 1.6 cm in maximum diameter mammographically on 07/15/2022. It is difficult to compare the size in the craniocaudal projection due to obscured margins. No interval findings elsewhere in the right breast suspicious for malignancy. Targeted  ultrasound is performed, showing a 1.9 x 1.9 x 1.8 cm irregular, hypoechoic mass with ill-defined surrounding increased echogenicity and indistinct margins in the 10 o'clock position of the right breast, 5 cm from the nipple. This contains a biopsy marker clip, corresponding to the patient's known malignancy. This measured 1.8 x 1.6 x 1.4 cm on 07/15/2022. Ultrasound of the right axilla demonstrated normal appearing right axillary lymph nodes IMPRESSION: Interval mild increase in size of the biopsy-proven malignancy in the 10 o'clock position of the right breast, currently measuring 1.9 cm in maximum diameter. RECOMMENDATION: Treatment plan. I have discussed the findings and recommendations with the patient. If applicable, a reminder letter will be sent to the patient regarding the next appointment. BI-RADS CATEGORY  6: Known biopsy-proven malignancy. Electronically Signed   By: Beckie Salts M.D.   On: 12/17/2022 10:04   Labs: BNP (last 3 results) No results for input(s): "BNP" in the last 8760 hours. Basic Metabolic Panel: Recent Labs  Lab 12/29/22 1652 12/31/22 0804  NA 135 138  K 3.4* 4.0  CL 101 107  CO2 23 22  GLUCOSE 116* 99  BUN 12 6  CREATININE 0.84 0.81  CALCIUM 9.9 9.3   Liver Function Tests: Recent Labs  Lab 12/29/22 1652 12/31/22 0804  AST 38 36  ALT 25 24  ALKPHOS 345* 284*  BILITOT 0.6 0.3  PROT 8.4* 7.1  ALBUMIN 4.2 3.4*   Recent Labs  Lab 12/29/22 1652  LIPASE 26   No results for input(s): "AMMONIA" in the last 168 hours. CBC: Recent Labs  Lab 12/29/22 1652 12/31/22 0804  WBC 9.0 5.3  NEUTROABS 6.0  --   HGB 12.6 11.5*  HCT 39.4 35.4*  MCV 88.5 88.5  PLT 314 266      Component Value Date/Time   COLORURINE YELLOW 12/29/2022 1531   APPEARANCEUR HAZY (A) 12/29/2022 1531   LABSPEC 1.028 12/29/2022 1531   PHURINE 5.0 12/29/2022 1531   GLUCOSEU NEGATIVE 12/29/2022 1531   HGBUR NEGATIVE 12/29/2022 1531   BILIRUBINUR NEGATIVE 12/29/2022 1531    KETONESUR NEGATIVE 12/29/2022 1531   PROTEINUR 30 (A) 12/29/2022 1531   NITRITE NEGATIVE 12/29/2022 1531   LEUKOCYTESUR SMALL (A) 12/29/2022 1531   Sepsis Labs Recent Labs  Lab 12/29/22 1652 12/31/22 0804  WBC 9.0 5.3   Microbiology Recent Results (from the past 240 hour(s))  Blood culture (routine x 2)     Status: None (Preliminary result)   Collection Time: 12/29/22  6:10 PM   Specimen: BLOOD  Result Value Ref Range Status   Specimen Description   Final    BLOOD BLOOD RIGHT FOREARM Performed at [facility], [address], [place], [place] [address]    Special Requests   Final    BOTTLES DRAWN AEROBIC AND ANAEROBIC Blood Culture results may not be optimal due to an excessive volume of blood received in culture bottles Performed at [facility], [address] [place], [place] [address]    Culture   Final    NO GROWTH 2 DAYS Performed at [facility], [address] [place], [place] [address]    Report Status PENDING  Incomplete  Blood culture (routine x 2)     Status: None (Preliminary result)   Collection Time: 12/29/22  7:55 PM   Specimen: BLOOD LEFT HAND  Result Value Ref Range Status   Specimen Description   Final    BLOOD LEFT HAND Performed at [facility], [address], [place], [place] [address]    Special Requests   Final    BOTTLES DRAWN AEROBIC AND ANAEROBIC Blood Culture results may not be optimal due to an excessive volume of blood received in culture bottles Performed at [facility], [address] [place], [place] [address]    Culture   Final    NO GROWTH 2 DAYS Performed at [facility], [address], [place], [place] [address]    Report Status PENDING  Incomplete  Time coordinating discharge: 25 minutes SIGNED: Lanae Boast, MD  [facility] 12/31/2022, 2:32 PM  If 7PM-7AM, please contact night-coverage [email or web address]

## 2022-12-31 NOTE — Progress Notes (Signed)
This chaplain responded to PMT NP-Julia consult for creating/updating the Pt. Advance Directive:  HCPOA and Living. The Pt. participated in AD education with NP-Julia and answered clarifying questions with the chaplain.  The chaplain is present with the Pt., notary, and witnesses for the notarizing of the Pt. AD. The Pt. named Roxy Manns as the healthcare agent. If the healthcare agent is unwilling or unable to serve in this role the Pt. second choice is [name].  The Pt. created a Living Will.  The chaplain gave the Pt. the original AD along with two copies. The chaplain scanned a copy of the Pt. AD into the Pt. EMR.  This chaplain is available for F/U spiritual care as needed.  Chaplain Stephanie Acre [phone]

## 2022-12-31 NOTE — Progress Notes (Signed)
[facility] Infectious Diseases Follow Up Note  Patient Identification: Patient Name: Natalie Griffin MRN: [id number] Admit Date: 12/29/2022  2:19 PM Age: 47 y.o.Today's Date: 12/31/2022  Reason for Visit: ? Vertebral infection  Principal Problem:   Spinal abscess Active Problems:   Malignant neoplasm of upper-inner quadrant of left breast in female, estrogen receptor positive   Pathological fracture of right hip   Closed wedge compression fracture of T10 vertebra   Hypokalemia  Antibiotics:  Zosyn 4/9-   Lines/Hardware: IM nail in femur   Interval Events: remains afebrile, blood cx 4/9 2/2 sets NG in 2 days, patient has refused IV abtx per RN  Assessment 47 year old female with PMH of metastatic breast cancer including bone mets status post bilateral salpingo-oophorectomy as well as palliative radiation of iliac crest and hip not on tx per patient's preference on anastrozole who presented to the ED on 4/9 with acute on chronic right lower back pain that started last Friday.  MRI with multiple abnormalities related to bone mets from breast ca and also question about discitis and osteomyelitis.    No plan for neurosurgical intervention   Per IR Dr Lowella Dandy as well as my separate discussion with radiologist Dr Tasia Catchings, MRI findings are more s/o reactive edema related to known bone mets from breast ca and less likely infection related.  She had reported one episode of fever on admission but relays it was likely due to hot flashes and she did not take temperature at home.    She has refused aspiration vs biopsy to r/o any vertebral infection at this time to me as well as other providers, I discussed with her about medical management of TRUE vertebral infection would consist of  prolonged IV abtx/PICC and did not seem to be interested as well if suspicion of vertebral infection is low.  12/31/22 Back pain is improved. No new complaints. Not  willing for biopsy. CRP mildly elevated but not impressive   Recommendations Clinically suspicion of vertebral infection is low, however cannot completely r/o. She is not interested in disc biopsy as well as antibiotics if suspicion of infection is low. She has been refusing abtx. DC abtx ID will SO D/w ID pharmacy  Rest of the management as per the primary team. Thank you for the consult. Please page with pertinent questions or concerns.  ______________________________________________________________________ Subjective patient seen and examined at the bedside. Back pain is improved, denies fevers, chills, nausea, vomiting.   Vitals BP 123/89 (BP Location: Right Arm)   Pulse 86   Temp 98.6 F (37 C) (Oral)   Resp 18   Wt 79 kg   SpO2 95%   BMI 28.98 kg/m '   Physical Exam Constitutional:  walking in the room and cleaning table    Comments:   Cardiovascular:     Rate and Rhythm: Normal rate and regular rhythm.     Heart sounds:   Pulmonary:     Effort: Pulmonary effort is normal on room air     Comments:   Abdominal:     Palpations: Abdomen is soft.     Tenderness: non distended   Musculoskeletal:        General: No swelling or tenderness  Skin:    Comments: No rashes   Neurological:     General: awake, alert and oriented, follows commands and ambulatory   Psychiatric:        Mood and Affect: Mood normal.   Pertinent Microbiology Results for orders placed or performed during  the hospital encounter of 12/29/22  Blood culture (routine x 2)     Status: None (Preliminary result)   Collection Time: 12/29/22  6:10 PM   Specimen: BLOOD  Result Value Ref Range Status   Specimen Description   Final    BLOOD BLOOD RIGHT FOREARM Performed at [facility], [address] [place], [place] [address]    Special Requests   Final    BOTTLES DRAWN AEROBIC AND ANAEROBIC Blood Culture results may not be optimal due to an excessive volume of blood  received in culture bottles Performed at [facility], [address], [place], [place] [address]    Culture   Final    NO GROWTH 2 DAYS Performed at [facility], [address], [place], [place] [address]    Report Status PENDING  Incomplete  Blood culture (routine x 2)     Status: None (Preliminary result)   Collection Time: 12/29/22  7:55 PM   Specimen: BLOOD LEFT HAND  Result Value Ref Range Status   Specimen Description   Final    BLOOD LEFT HAND Performed at [facility], [address], [place], [place] [address]    Special Requests   Final    BOTTLES DRAWN AEROBIC AND ANAEROBIC Blood Culture results may not be optimal due to an excessive volume of blood received in culture bottles Performed at [facility], [address] [place], [place] [address]    Culture   Final    NO GROWTH 2 DAYS Performed at [facility], [address], [place], [place] [address]    Report Status PENDING  Incomplete    Pertinent Lab.    Latest Ref Rng & Units 12/31/2022    8:04 AM 12/29/2022    4:52 PM 09/23/2022    2:17 PM  CBC  WBC 4.0 - 10.5 K/uL 5.3  9.0  5.5   Hemoglobin 12.0 - 15.0 g/dL 25.3  66.4  40.3   Hematocrit 36.0 - 46.0 % 35.4  39.4  37.4   Platelets 150 - 400 K/uL 266  314  292       Latest Ref Rng & Units 12/31/2022    8:04 AM 12/29/2022    4:52 PM 09/23/2022    2:17 PM  CMP  Glucose 70 - 99 mg/dL 99  474  259   BUN 6 - 20 mg/dL 6  12  14    Creatinine 0.44 - 1.00 mg/dL 5.63  8.75  6.43   Sodium 135 - 145 mmol/L 138  135  139   Potassium 3.5 - 5.1 mmol/L 4.0  3.4  4.8   Chloride 98 - 111 mmol/L 107  101  105   CO2 22 - 32 mmol/L 22  23  24    Calcium 8.9 - 10.3 mg/dL 9.3  9.9  9.7   Total Protein 6.5 - 8.1 g/dL 7.1  8.4  7.6   Total Bilirubin 0.3 - 1.2 mg/dL 0.3  0.6  0.2   Alkaline Phos 38 - 126 U/L 284  345  160   AST 15 - 41 U/L 36  38  59   ALT 0 - 44 U/L 24  25  52      Pertinent Imaging today Plain films  and CT images have been personally visualized and interpreted; radiology reports have been reviewed. Decision making incorporated into the Impression / Recommendations.  No results found.  I spent 52 minutes  for this patient encounter including review of prior medical records, coordination of care with primary/other specialist with greater than 50% of time being face to face/counseling and discussing diagnostics/treatment plan with the patient/family.  Electronically signed by:   Rosiland Oz, MD Infectious Disease Physician [facility] for Infectious Disease Pager: [phone]

## 2022-12-31 NOTE — Consult Note (Signed)
Palliative Care Consult Note                                  Date: 12/31/2022   Patient Name: Natalie Griffin  DOB: [birth year]  MRN: [id number]  Age / Sex: 47 y.o., female  PCP: Charlane FerrettiSkakle, [place], DO Referring Physician: Lanae BoastKc, Ramesh, MD  Reason for Consultation: Establishing goals of care, symptom management  HPI/Patient Profile: 47 y.o. female  with past medical history of metastatic breast cancer with extensive osseous metastasis, anxiety, and anemia of chronic disease who presented to [facility] ED on 12/29/2022 with right flank and back pain.  MRI of the lumbar spine showed compression fractures of T12, L2, and L4 with no obvious abscess but diffuse metastatic disease.  MRI of the thoracic spine showed pathologic fractures involving multiple levels including T4-T5, T10, and T12 with paraspinous edema along the anterior margin of the spinal column, which could reflect extra osseous extension of tumor versus superimposed infection with osteomyelitis.  Neurosurgery and infectious disease were consulted.  Palliative Medicine has been consulted for goals of care and symptom management in the setting of metastatic cancer.    Past Medical History:  Diagnosis Date   Anemia    Anxiety    Bone metastasis    Breast cancer 2021   Family history of adverse reaction to anesthesia    Father and Aunt have pseudocholinesterase deficiency - Trouble waking up from anesthesia   History of hiatal hernia    History of kidney stones    noted on CT Left nonobstructive    Subjective:   I have reviewed medical records including progress notes, labs and imaging and met with patient to discuss diagnosis, prognosis, GOC, and symptom management.  I introduced Palliative Medicine as specialized medical care for people living with serious illness. It focuses on providing relief from the symptoms and stress of a serious illness.   We discussed patient's current illness  and what it means in the larger context of her ongoing co-morbidities. Current clinical status was reviewed. Natural disease trajectory of metastatic cancer was discussed.  Created space and opportunity for patient to express thoughts and feelings regarding her current medical situation. Values and goals of care were attempted to be elicited.  A discussion was had today regarding advanced directives. Concepts specific to code status, artifical feeding and hydration, continued IV antibiotics and rehospitalization was had.   Questions and concerns addressed.      Life Review: Sonny MastersCandace is originally from [place]. 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  Functional Status: Ambulatory and fully independent.   Patient Understanding of Illness: Eilene understands that metastatic cancer is a non-curable illness.   Patient Values and Goals: Stashia speaks to the importance of "quality over quantity", and states this as the reason she decided not to have chemotherapy. Her goal is to feel as good as possible for as long as possible.   Palliative symptoms: Back pain and muscle spasms - Robaxin was started today, and Halina reports this is helping. We discussed using steroids for bone pain, but Kalia does not want to make additional changes to her regimen today. Constipation - Mikena reports she has not had a BM in ~7 days.   Advanced Directives: None currently. Sonny MastersCandace is interested in completing advanced directives while in the hospital. I gave her an AD packet to review.    Review of Systems  Gastrointestinal:  Positive for constipation.  Musculoskeletal:  Positive for back pain.    Objective:   Primary Diagnoses: Present on Admission:  Pathological fracture of right hip  Closed wedge compression fracture of T10 vertebra  Spinal abscess  Hypokalemia   Physical Exam Vitals reviewed.  Constitutional:      General: She is not in acute  distress. Pulmonary:     Effort: Pulmonary effort is normal.  Neurological:     Mental Status: She is alert and oriented to person, place, and time.  Psychiatric:        Behavior: Behavior normal.     Vital Signs:  BP 123/89 (BP Location: Right Arm)   Pulse 86   Temp 98.6 F (37 C) (Oral)   Resp 18   Wt 79 kg   SpO2 95%   BMI 28.98 kg/m   Palliative Assessment/Data: PPS 70%     Assessment & Plan:   SUMMARY OF RECOMMENDATIONS   DNR/DNI as previously documented Continue current supportive interventions Spiritual care referral for assistance with advanced directives Referral to [facility] palliative [facility]  Symptom Management: Back pain and muscle spasms Continue MS Contin 30 mg every 12 hours Continue oxycodone-acetaminophen 5-325 every 8 hours as needed for breakthrough pain Continue Robaxin 500 mg every 6 hours as needed for muscle spasms Consider starting dexamethasone 4 mg daily 2. Constipation Continue miralax 2 times daily  Continue biascodyl 5 mg daily PRN Consider bisacodyl suppository if no BM by tomorrow  Primary Decision Maker: PATIENT   Prognosis:  Unable to determine    Thank you for allowing [place] to participate in the care of Daizha A Henrikson   Time Total: 90 minutes  Greater than 50%  of this time was spent counseling and coordinating care related to the above assessment and plan.   Signed by: Sherlean Foot, NP Palliative Medicine Team  Team Phone # [phone]  For individual providers, please see AMION

## 2022-12-31 NOTE — Assessment & Plan Note (Addendum)
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  Patient refused adjuvant chemo, adjuvant radiation and adjuvant antiestrogen therapy.   Patient went to orthopedics for right hip pain.  Imaging revealed bone metastasis.  CT CAP: Cortical destruction of the right femoral neck consistent with skeletal metastases at risk for pathologic fracture.  Lucent lesion in the left iliac bone and L3 vertebral body consistent with multifocal skeletal metastases. 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   CT chest abdomen pelvis 05/07/2022: No new or progressive bone metastases.  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%, HER2 3+ positive, Ki-67 35%     Treatment plan change: Oophorectomy: 09/30/2022 Current treatment: Anastrozole Patient refused switching from Herceptin to Kadcyla (her insurance is denied Herceptin Hylecta and patient does not want to receive it in IV form), stopped Herceptin Refused neratinib   Pain control: MS Contin and Percocets that she takes as needed, muscle relaxants have been  12/17/2022: Mammogram and ultrasound right breast: Mild interval increase in size of the biopsy-proven newly lesion 1.9 cm  Patient is  switching her insurance and she thinks that the Herceptin Hylecta will be approved by her new insurance.  If he does get approved that is what she wants to do.  She does not want to Kadcyla or neratinib or any other treatment.  I will see her back in 1 month for follow-up.

## 2023-01-01 ENCOUNTER — Telehealth: Payer: Self-pay | Admitting: Hematology and Oncology

## 2023-01-01 ENCOUNTER — Encounter: Payer: Self-pay | Admitting: Hematology and Oncology

## 2023-01-01 NOTE — Telephone Encounter (Signed)
Contacted patient to scheduled appointments. Patient is aware of appointments that are scheduled.   

## 2023-01-03 LAB — CULTURE, BLOOD (ROUTINE X 2)
Culture: NO GROWTH
Culture: NO GROWTH

## 2023-01-04 ENCOUNTER — Other Ambulatory Visit: Payer: Self-pay | Admitting: *Deleted

## 2023-01-04 DIAGNOSIS — C7951 Secondary malignant neoplasm of bone: Secondary | ICD-10-CM

## 2023-01-04 NOTE — Progress Notes (Signed)
Per MD request, RN placed referral to [facility] palliative care.  Pt was seen while hospitalized and pt would like to f/u out pt.

## 2023-01-05 ENCOUNTER — Other Ambulatory Visit (HOSPITAL_COMMUNITY): Payer: Self-pay

## 2023-01-05 ENCOUNTER — Inpatient Hospital Stay (HOSPITAL_BASED_OUTPATIENT_CLINIC_OR_DEPARTMENT_OTHER): Payer: 59 | Admitting: Hematology and Oncology

## 2023-01-05 ENCOUNTER — Telehealth: Payer: Self-pay | Admitting: Pharmacy Technician

## 2023-01-05 ENCOUNTER — Other Ambulatory Visit: Payer: Self-pay | Admitting: Hematology and Oncology

## 2023-01-05 ENCOUNTER — Other Ambulatory Visit: Payer: Self-pay

## 2023-01-05 DIAGNOSIS — Z170 Estrogen receptor positive status [ER+]: Secondary | ICD-10-CM

## 2023-01-05 DIAGNOSIS — C50212 Malignant neoplasm of upper-inner quadrant of left female breast: Secondary | ICD-10-CM

## 2023-01-05 MED ORDER — NERATINIB MALEATE 40 MG PO TABS
120.0000 mg | ORAL_TABLET | Freq: Every day | ORAL | 0 refills | Status: DC
  Filled 2023-01-05 – 2023-01-06 (×4): qty 90, 30d supply, fill #0

## 2023-01-05 MED ORDER — ALPRAZOLAM 0.5 MG PO TABS
0.5000 mg | ORAL_TABLET | Freq: Three times a day (TID) | ORAL | 3 refills | Status: DC | PRN
  Filled 2023-01-05: qty 60, 20d supply, fill #0
  Filled 2023-02-18: qty 60, 20d supply, fill #1
  Filled 2023-03-11: qty 60, 20d supply, fill #2

## 2023-01-05 NOTE — Assessment & Plan Note (Signed)
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  Patient refused adjuvant chemo, adjuvant radiation and adjuvant antiestrogen therapy.   Patient went to orthopedics for right hip pain.  Imaging revealed bone metastasis.  CT CAP: Cortical destruction of the right femoral neck consistent with skeletal metastases at risk for pathologic fracture.  Lucent lesion in the left iliac bone and L3 vertebral body consistent with multifocal skeletal metastases. 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   CT chest abdomen pelvis 05/07/2022: No new or progressive bone metastases.  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%, HER2 3+ positive, Ki-67 35%     Treatment plan change: Oophorectomy: 09/30/2022 Current treatment: Anastrozole Patient refused switching from Herceptin to Kadcyla (her insurance is denied Herceptin Hylecta and patient does not want to receive it in IV form), stopped Herceptin Refused neratinib   Pain control: MS Contin and Percocets that she takes as needed, muscle relaxants have been   12/17/2022: Mammogram and ultrasound right breast: Mild interval increase in size of the biopsy-proven newly lesion 1.9 cm   Patient  is switching her insurance and she thinks that the Herceptin Hylecta will be approved by her new insurance.  If he does get approved that is what she wants to do.  She does not want to Kadcyla or neratinib or any other treatment.

## 2023-01-05 NOTE — Progress Notes (Signed)
HEMATOLOGY-ONCOLOGY TELEPHONE VISIT PROGRESS NOTE  I connected with our patient on 01/05/23 at 10:45 AM EDT by telephone and verified that I am speaking with the correct person using two identifiers.  I discussed the limitations, risks, security and privacy concerns of performing an evaluation and management service by telephone and the availability of in person appointments.  I also discussed with the patient that there may be a patient responsible charge related to this service. The patient expressed understanding and agreed to proceed.   History of Present Illness: Natalie Griffin is a  47 year old with a history of left breast cancer. Currently on anastrozole. She presents to the clinic for a telephone follow-up.  She is in a lot of pain and discomfort especially in the right hip.  She has been taking more Percocet than usual.  She does not think the MS Contin is helping her a whole lot.  She wants to discuss with me about starting neratinib until her prescription and insurance coverage are established to where she can receive the Herceptin Hylecta through the new insurance which apparently can take 90 days.  Oncology History  Malignant neoplasm of upper-inner quadrant of left breast in female, estrogen receptor positive  04/11/2020 Initial Diagnosis   Patient palpated a left breast lump. Mammogram on 03/08/20 showed a 3.0cm mass at the 11 o'clock position with no axillary adenopathy. 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    02/2021 Relapse/Recurrence   Patient went  to orthopedics for right hip pain.  Imaging revealed bone metastasis.  CT CAP: Cortical destruction of the right femoral neck consistent with skeletal metastases at risk for pathologic fracture.  Lucent lesion in the left iliac bone and L3 vertebral body consistent with multifocal skeletal metastases.   04/01/2021 Surgery   04/01/2021: Femoral intramedullary nail  Pathology review: Metastatic breast cancer ER 2% PR 0% HER2 3+ positive   04/08/2021 -  Anti-estrogen oral therapy   Tamoxifen 20 mg was prescribed on 04/08/2021 (discontinued by patient because she felt that the risks and benefits did not support it )--resumed in 07/2022 based on pathology results from right breast biopsy   05/14/2021 -  Chemotherapy   Subcutaneous Herceptin Perjeta (Phesgo) injection started 05/14/2021, switched to Herceptin Hylecta 06/11/2021 (Fixed drug eruption, profound diarrhea to Phesgo)     05/20/2021 - 06/03/2021 Radiation Therapy   Palliative radiation to the iliac bone and hip   02/04/2022 Imaging   MRI thoracic spine 02/04/2022: Numerous bone metastases thoracic spine cervical and lumbar spines largest T5, T8, T11.  Chronic compression fractures T4, T5, T10 and T12    05/07/2022 Imaging   CT chest abdomen pelvis: No new or progressive bone metastases.  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   07/29/2022 PET scan   IMPRESSION: 1. Hypermetabolic mass in the posterior deep RIGHT breast consistent primary breast  carcinoma. 2. Central nodal mediastinal metastasis to the high LEFT prevascular space and LEFT super clavicular node. 3. 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  [name] recommended changing treatment to Kadcyla and Britlee wanted  to wait until after the holidays.  She will discuss further with Dr. Pamelia Hoit in January.   11/19/2022 - 11/19/2022 Chemotherapy   Patient is on Treatment Plan : BREAST Trastuzumab IV (8/6) or SQ (600) D1 q21d       REVIEW OF SYSTEMS:   Constitutional: Denies fevers, chills or abnormal weight loss All other systems were reviewed with the patient and are negative. Observations/Objective:     Assessment Plan:  Malignant neoplasm of upper-inner quadrant of left breast in female, estrogen receptor positive 06/04/2020:Left lumpectomy ([name]): invasive and in situ ductal carcinoma, 3.2cm, clear margins, one left axillary lymph node negative for carcinoma.  Patient refused adjuvant chemo, adjuvant radiation and adjuvant antiestrogen therapy.   Patient went to orthopedics for right hip pain.  Imaging revealed bone metastasis.  CT CAP: Cortical destruction of the right femoral neck consistent with skeletal metastases at risk for pathologic fracture.  Lucent lesion in the left iliac bone and L3 vertebral body consistent with multifocal skeletal metastases. 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   CT chest abdomen pelvis 05/07/2022: No new or progressive bone metastases.  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%, HER2 3+  positive, Ki-67 35%     Treatment plan change: Oophorectomy: 09/30/2022 Current treatment: Anastrozole Patient refused switching from Herceptin to Kadcyla (her insurance is denied Herceptin Hylecta and patient does not want to receive it in IV form), stopped Herceptin Patient finally agreed to take neratinib.  I will start her on 3 tablets a day and we can titrated upwards if she tolerates it well.  She understands the risks and benefits of neratinib including the risk of diarrhea, fatigue, LFT changes, cardiac and other adverse effects.   Pain control: MS Contin and Percocets that she takes as needed, muscle relaxants have been   12/17/2022: Mammogram and ultrasound right breast: Mild interval increase in size of the biopsy-proven newly lesion 1.9 cm   Patient is switching her insurance and she thinks that the Herceptin Hylecta will be approved by her new insurance.    Return to clinic in 3 weeks with labs and follow-up with me and we can decide if he can increase the dosage from 3 tablets a day to 4 tablets a day.   I discussed the assessment and treatment plan with the patient. The patient was provided an opportunity to ask questions and all were answered. The patient agreed with the plan and demonstrated an understanding of the instructions. The patient was advised to call back or seek an in-person evaluation if the symptoms worsen or if the condition fails to improve as anticipated.   I provided 12 minutes of non-face-to-face time during this encounter.  This includes time for charting and coordination of care   Tamsen Meek, MD  I Janan Ridge am acting as a scribe for Dr.Kynadie Yaun  I have reviewed the above documentation for accuracy and completeness, and I agree with the above.

## 2023-01-05 NOTE — Telephone Encounter (Signed)
Oral Oncology Patient Advocate Encounter   Received notification that prior authorization for Nerlynx is required.   PA submitted on 01/05/23 Key [id number] Status is pending     Jinger Neighbors, CPhT-Adv Oncology Pharmacy Patient Advocate [facility] Direct Number: [phone]  Fax: [phone]

## 2023-01-06 ENCOUNTER — Other Ambulatory Visit (HOSPITAL_COMMUNITY): Payer: Self-pay

## 2023-01-06 ENCOUNTER — Telehealth: Payer: Self-pay | Admitting: Nurse Practitioner

## 2023-01-06 ENCOUNTER — Other Ambulatory Visit: Payer: Self-pay

## 2023-01-06 NOTE — Telephone Encounter (Signed)
Oral Oncology Patient Advocate Encounter  Prior Authorization for Nerlynx has been approved.    PA#  [id number] Effective dates: 01/05/23 through 01/04/24  Patients co-pay is $0.    Jinger Neighbors, CPhT-Adv Oncology Pharmacy Patient Advocate [facility] Direct Number: [phone]  Fax: [phone]

## 2023-01-20 ENCOUNTER — Telehealth: Payer: Self-pay | Admitting: Nurse Practitioner

## 2023-01-20 NOTE — Progress Notes (Deleted)
Palliative Medicine [facility]  [phone] [phone]   Name: Natalie Griffin Date: 01/20/2023 MRN: [id number]  DOB: 03/06/1976  Patient Care Team: Charlane Ferretti, DO as PCP - General (Internal Medicine) Donnelly Angelica, RN as Oncology Nurse Navigator Pershing Proud, RN as Oncology Nurse Navigator    REASON FOR CONSULTATION: Natalie Griffin is a 47 y.o. female with oncologic medical history including metastatic breast cancer (04/2020) with extensive osseous metastasis, anxiety, and anemia. Palliative ask to see for symptom pain management and goals of care.    SOCIAL HISTORY:     reports that she quit smoking about 2 years ago. Her smoking use included cigarettes. She has never used smokeless tobacco. She reports that she does not currently use alcohol. She reports that she does not use drugs.  ADVANCE DIRECTIVES:  Advanced directives and DNR on file naming Natalie Griffin as the patient's medical decision maker should she become unable to make decisions for herself.  CODE STATUS: DNR  PAST MEDICAL HISTORY: Past Medical History:  Diagnosis Date   Anemia    Anxiety    Bone metastasis    Breast cancer (HCC) 2021   Left breast invasive ductal carcinoma   Cancer (HCC) 2021   Left breast   Family history of adverse reaction to anesthesia    Father and Aunt have pseudocholinesterase deficiency - Trouble waking up from anesthesia   History of hiatal hernia    History of kidney stones    noted on CT Left nonobstructive    PAST SURGICAL HISTORY:  Past Surgical History:  Procedure Laterality Date   BREAST BIOPSY Right 07/21/2022   [place] RT BREAST BX W LOC DEV 1ST LESION IMG BX SPEC US GUIDE 07/21/2022 [facility] MAMMOGRAPHY   BREAST LUMPECTOMY WITH RADIOACTIVE SEED AND SENTINEL LYMPH NODE BIOPSY Left 06/04/2020   Procedure: LEFT BREAST LUMPECTOMY WITH RADIOACTIVE SEED AND SENTINEL LYMPH NODE MAPPING;  Surgeon: Harriette Bouillon, MD;  Location:  [facility] OR;  Service: General;  Laterality: Left;  PEC BLOCK, RNFA   FEMUR IM NAIL Right 04/01/2021   Procedure: INTRAMEDULLARY (IM) NAIL FEMORAL;  Surgeon: Sheral Apley, MD;  Location: [facility] ORS;  Service: Orthopedics;  Laterality: Right;   ROBOTIC ASSISTED BILATERAL SALPINGO OOPHERECTOMY Bilateral 09/30/2022   Procedure: XI ROBOTIC ASSISTED BILATERAL SALPINGO OOPHORECTOMY;  Surgeon: Carver Fila, MD;  Location: [facility];  Service: Gynecology;  Laterality: Bilateral;   WISDOM TOOTH EXTRACTION      HEMATOLOGY/ONCOLOGY HISTORY:  Oncology History  Malignant neoplasm of upper-inner quadrant of left breast in female, estrogen receptor positive (HCC)  04/11/2020 Initial Diagnosis   Patient palpated a left breast lump. Mammogram on 03/08/20 showed a 3.0cm mass at the 11 o'clock position with no axillary adenopathy. 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    02/2021 Relapse/Recurrence   Patient went to orthopedics for right hip pain.  Imaging revealed bone metastasis.  CT CAP: Cortical destruction of the right femoral neck consistent with skeletal metastases at risk for pathologic fracture.  Lucent lesion in the  left iliac bone and L3 vertebral body consistent with multifocal skeletal metastases.   04/01/2021 Surgery   04/01/2021: Femoral intramedullary nail  Pathology review: Metastatic breast cancer ER 2% PR 0% HER2 3+ positive   04/08/2021 -  Anti-estrogen oral therapy   Tamoxifen 20 mg was prescribed on 04/08/2021 (discontinued by patient because she felt that the risks  and benefits did not support it )--resumed in 07/2022 based on pathology results from right breast biopsy   05/14/2021 -  Chemotherapy   Subcutaneous Herceptin Perjeta (Phesgo) injection started 05/14/2021, switched to Herceptin Hylecta 06/11/2021 (Fixed drug eruption, profound diarrhea to Phesgo)     05/20/2021 - 06/03/2021 Radiation Therapy   Palliative radiation to the iliac bone and hip   02/04/2022 Imaging   MRI thoracic spine 02/04/2022: Numerous bone metastases thoracic spine cervical and lumbar spines largest T5, T8, T11.  Chronic compression fractures T4, T5, T10 and T12    05/07/2022 Imaging   CT chest abdomen pelvis: No new or progressive bone metastases.  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   07/29/2022 PET scan   IMPRESSION: 1. Hypermetabolic mass in the posterior deep RIGHT breast consistent primary breast carcinoma. 2. Central nodal mediastinal metastasis to the high LEFT prevascular space and LEFT super clavicular node. 3. 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  [name] recommended changing treatment to Kadcyla and Litha wanted to wait until after the holidays.  She will discuss further with Dr. Pamelia Hoit in January.   11/19/2022 - 11/19/2022 Chemotherapy   Patient is on Treatment Plan : BREAST Trastuzumab IV (8/6) or SQ (600) D1 q21d       ALLERGIES:  is allergic to adhesive [tape], fentanyl, phesgo [pertuz-trastuz-hyaluron-zzxf], tetanus toxoid, and succinylcholine.  MEDICATIONS:  Current Outpatient Medications  Medication Sig Dispense Refill   ALPRAZolam (XANAX) 0.5 MG tablet Take 1 tablet (0.5 mg total) by mouth 3 (three) times daily as needed for anxiety. 60 tablet 3   anastrozole (ARIMIDEX)  1 MG tablet Take 1 tablet (1 mg total) by mouth daily. 90 tablet 3   bisacodyl (DULCOLAX) 10 MG suppository Place 1 suppository (10 mg total) rectally daily as needed for up to 7 doses for moderate constipation. 12 suppository 0   bisacodyl (DULCOLAX) 5 MG EC tablet Take 1 tablet (5 mg total) by mouth daily as needed for moderate constipation. 30 tablet 0   docusate sodium (COLACE) 100 MG capsule Take 1 capsule (100 mg total) by mouth 2 (two) times daily. 10 capsule 0   ferrous sulfate 324 MG TBEC Take 324 mg by mouth daily in the afternoon.     methocarbamol (ROBAXIN) 750 MG tablet Take 1 tablet (750 mg total) by mouth 3 (three) times daily as needed for muscle spasms. 90 tablet 0   morphine (MS CONTIN) 30 MG 12 hr tablet Take 1 tablet (30 mg total) by mouth every 12 (twelve) hours. 60 tablet 0   Neratinib Maleate (NERLYNX) 40 MG tablet Take 3 tablets (120 mg total) by mouth daily. Take with food. 90 tablet 0   oxyCODONE-acetaminophen (PERCOCET) 10-325 MG tablet Take 1 tablet by mouth every 8 (eight) hours as needed for pain. 90 tablet 0   polyethylene glycol (MIRALAX / GLYCOLAX) 17 g packet Take 17 g  by mouth 2 (two) times daily. 14 each 0   Vitamin D, Cholecalciferol, 25 MCG (1000 UT) TABS Take 1 tablet by mouth daily. 60 tablet 3   No current facility-administered medications for this visit.    VITAL SIGNS: There were no vitals taken for this visit. There were no vitals filed for this visit.  Estimated body mass index is 28.98 kg/m as calculated from the following:   Height as of 11/24/22: 5\' 5"  (1.651 m).   Weight as of 12/29/22: 174 lb 2.6 oz (79 kg).  LABS: CBC:    Component Value Date/Time   WBC 5.3 12/31/2022 0804   HGB 11.5 (L) 12/31/2022 0804   HGB 10.9 (L) 09/17/2022 1201   HCT 35.4 (L) 12/31/2022 0804   PLT 266 12/31/2022 0804   PLT 279 09/17/2022 1201   MCV 88.5 12/31/2022 0804   NEUTROABS 6.0 12/29/2022 1652   LYMPHSABS 1.9 12/29/2022 1652   MONOABS 0.8 12/29/2022 1652    EOSABS 0.3 12/29/2022 1652   BASOSABS 0.1 12/29/2022 1652   Comprehensive Metabolic Panel:    Component Value Date/Time   NA 138 12/31/2022 0804   K 4.0 12/31/2022 0804   CL 107 12/31/2022 0804   CO2 22 12/31/2022 0804   BUN 6 12/31/2022 0804   CREATININE 0.81 12/31/2022 0804   CREATININE 0.79 09/17/2022 1201   GLUCOSE 99 12/31/2022 0804   CALCIUM 9.3 12/31/2022 0804   AST 36 12/31/2022 0804   AST 45 (H) 09/17/2022 1201   ALT 24 12/31/2022 0804   ALT 40 09/17/2022 1201   ALKPHOS 284 (H) 12/31/2022 0804   BILITOT 0.3 12/31/2022 0804   BILITOT 0.2 (L) 09/17/2022 1201   PROT 7.1 12/31/2022 0804   ALBUMIN 3.4 (L) 12/31/2022 0804    RADIOGRAPHIC STUDIES: MR Lumbar Spine W Wo Contrast  Result Date: 12/29/2022 CLINICAL DATA:  Initial evaluation for back pain, history of metastatic disease. Evaluate for infection. EXAM: MRI LUMBAR SPINE WITHOUT AND WITH CONTRAST TECHNIQUE: Multiplanar and multiecho pulse sequences of the lumbar spine were obtained without and with intravenous contrast. CONTRAST:  7.29mL GADAVIST GADOBUTROL 1 MMOL/ML IV SOLN COMPARISON:  Prior radiograph from 07/16/2022. FINDINGS: Segmentation: Standard. Lowest well-formed disc space labeled the L5-S1 level. Alignment: Physiologic with preservation of the normal lumbar lordosis. No listhesis. Vertebrae: Widespread osseous metastatic disease seen throughout the lumbar spine and visualized sacrum/pelvis. There is involvement of essentially all levels. Associated pathologic compression fractures involving the superior endplates of T12, L2, and L4 noted. Abnormal posterior convex bowing of the vertebral body cortex seen at L1 and L4. No significant extra osseous or epidural extension of tumor at this time. No MRI evidence for superimposed infection. Conus medullaris and cauda equina: Conus extends to the T12 level. Conus and cauda equina appear normal. No abnormal enhancement. Paraspinal and other soft tissues: Mild edema within the  lower posterior paraspinous musculature, nonspecific, but could reflect a degree of muscular injury/strain. This is not felt to be related to infection given overall appearance. No other paraspinous inflammatory changes. No collections. Disc levels: L1-2: Disc desiccation without significant disc bulge. No stenosis. L2-3:  Unremarkable. L3-4: Degenerative intervertebral disc space narrowing with disc desiccation and mild disc bulge. Mild facet spurring. No significant spinal stenosis. Foramina remain patent. L4-5: Negative interspace. Mild facet hypertrophy. No significant spinal stenosis. Foramina remain patent. L5-S1: Negative interspace. No significant canal or foraminal stenosis. IMPRESSION: 1. Widespread osseous metastatic disease throughout the lumbar spine and visualized sacrum/pelvis. No significant extra osseous or epidural extension  of tumor at this time. No MRI evidence for superimposed infection. 2. Associated pathologic compression fractures involving the superior endplates of T12, L2, and L4. 3. Mild edema within the lower posterior paraspinous musculature, nonspecific, but could reflect muscular injury and/or strain. 4. Underlying mild for age spondylosis without significant stenosis or overt neural impingement. Electronically Signed   By: Rise Mu M.D.   On: 12/29/2022 22:35   MR THORACIC SPINE W WO CONTRAST  Result Date: 12/29/2022 CLINICAL DATA:  Initial evaluation for thoracic osteomyelitis. EXAM: MRI THORACIC WITHOUT AND WITH CONTRAST TECHNIQUE: Multiplanar and multiecho pulse sequences of the thoracic spine were obtained without and with intravenous contrast. CONTRAST:  7.28mL GADAVIST GADOBUTROL 1 MMOL/ML IV SOLN COMPARISON:  Prior study from 02/03/2022. FINDINGS: Alignment: Vertebral bodies normally aligned with preservation of the normal thoracic kyphosis. No listhesis. Vertebrae: Widespread osseous metastatic disease seen throughout the thoracic spine. There is involvement of  essentially all levels, although involvement is most pronounced at T8 through T12. Pathologic compression fractures involving the T4, T5, T10, and T12 vertebral bodies noted height loss at T9 and T10 is mildly progressed as compared to previous MRI from 02/03/2022. Abnormal paraspinous edema and enhancement seen along the anterior margin of the spinal column at T9 through T11-12 (series 26, image 7). While this finding could reflect extra osseous extension of tumor, superimposed infection with osteomyelitis and paraspinous phlegmonous change could be considered in the correct clinical setting. No loculated collections. Mild smooth epidural enhancement seen extending from T9 through T11 as well, possibly reactive. No other epidural involvement of tumor. No other paraspinous inflammatory changes. Cord: Normal signal and morphology. Mild smooth epidural enhancement at T9 through T11-12. No other abnormal enhancement or significant epidural involvement. Paraspinal and other soft tissues: Paraspinous edema adjacent to the T9 through T11 12 levels as above. Paraspinous soft tissues demonstrate no other acute finding. Trace layering bilateral pleural effusions. Disc levels: Underlying mild for age spondylosis. No significant disc bulge or focal disc herniation. No spinal stenosis. Foramina remain patent. IMPRESSION: 1. Widespread osseous metastatic disease throughout the thoracic spine. Pathologic compression fractures involving the T4, T5, T10, and T12 vertebral bodies. Mildly progressive height loss seen about the T10 fracture as compared to previous MRI from 02/03/2022. 2. Abnormal paraspinous edema and enhancement along the anterior margin of the spinal column at T9 through T11-12. Finding is nonspecific, and could reflect extra osseous extension of tumor. Superimposed infection with osteomyelitis and paraspinous phlegmonous change difficult to exclude, and could be considered in the correct clinical setting. No  loculated collections. 3. Mild smooth epidural enhancement extending from T9 through T11, possibly reactive. No other epidural involvement of tumor. 4. Trace layering bilateral pleural effusions. Electronically Signed   By: Rise Mu M.D.   On: 12/29/2022 22:24    PERFORMANCE STATUS (ECOG) : {CHL ONC ECOG NW:[id number]}  Review of Systems Unless otherwise noted, a complete review of systems is negative.  Physical Exam General: NAD Cardiovascular: regular rate and rhythm Pulmonary: clear ant fields Abdomen: soft, nontender, + bowel sounds Extremities: no edema, no joint deformities Skin: no rashes Neurological:  IMPRESSION: *** I introduced myself, Briley Sulton RN, and Palliative's role in collaboration with the oncology team. Concept of Palliative Care was introduced as specialized medical care for people and their families living with serious illness.  It focuses on providing relief from the symptoms and stress of a serious illness.  The goal is to improve quality of life for both the patient and the family. Values and  goals of care important to patient and family were attempted to be elicited.    We discussed *** current illness and what it means in the larger context of *** on-going co-morbidities. Natural disease trajectory and expectations were discussed.  I discussed the importance of continued conversation with family and their medical providers regarding overall plan of care and treatment options, ensuring decisions are within the context of the patients values and GOCs.  PLAN: Established therapeutic relationship. Education provided on palliative's role in collaboration with their Oncology/Radiation team. I will plan to see patient back in 2-4 weeks in collaboration to other oncology appointments.    Patient expressed understanding and was in agreement with this plan. She also understands that She can call the clinic at any time with any questions, concerns, or  complaints.   Thank you for your referral and allowing Palliative to assist in Mrs. Kora A Gipson's care.   Number and complexity of problems addressed: ***HIGH - 1 or more chronic illnesses with SEVERE exacerbation, progression, or side effects of treatment - advanced cancer, pain. Any controlled substances utilized were prescribed in the context of palliative care.   Visit consisted of counseling and education dealing with the complex and emotionally intense issues of symptom management and palliative care in the setting of serious and potentially life-threatening illness.Greater than 50%  of this time was spent counseling and coordinating care related to the above assessment and plan.  Signed by: Willette Alma, AGPCNP-BC Palliative Medicine Team/[facility]   *Please note that this is a verbal dictation therefore any spelling or grammatical errors are due to the "[employer] One" system interpretation.

## 2023-01-21 ENCOUNTER — Inpatient Hospital Stay: Payer: 59

## 2023-01-22 ENCOUNTER — Other Ambulatory Visit: Payer: Self-pay | Admitting: Hematology and Oncology

## 2023-01-25 ENCOUNTER — Other Ambulatory Visit: Payer: Self-pay | Admitting: Hematology and Oncology

## 2023-01-25 ENCOUNTER — Other Ambulatory Visit (HOSPITAL_COMMUNITY): Payer: Self-pay

## 2023-01-25 MED ORDER — OXYCODONE-ACETAMINOPHEN 10-325 MG PO TABS
1.0000 | ORAL_TABLET | Freq: Three times a day (TID) | ORAL | 0 refills | Status: DC | PRN
  Filled 2023-01-25 – 2023-02-08 (×2): qty 90, 30d supply, fill #0

## 2023-01-25 MED ORDER — MORPHINE SULFATE ER 30 MG PO TBCR
30.0000 mg | EXTENDED_RELEASE_TABLET | Freq: Two times a day (BID) | ORAL | 0 refills | Status: DC
  Filled 2023-01-25 – 2023-02-08 (×2): qty 60, 30d supply, fill #0

## 2023-01-27 ENCOUNTER — Other Ambulatory Visit: Payer: Self-pay

## 2023-01-28 ENCOUNTER — Other Ambulatory Visit: Payer: Self-pay

## 2023-01-29 ENCOUNTER — Other Ambulatory Visit: Payer: Self-pay

## 2023-01-29 ENCOUNTER — Telehealth: Payer: Self-pay | Admitting: Hematology and Oncology

## 2023-02-01 ENCOUNTER — Other Ambulatory Visit (HOSPITAL_COMMUNITY): Payer: Self-pay

## 2023-02-01 ENCOUNTER — Other Ambulatory Visit: Payer: Self-pay

## 2023-02-02 ENCOUNTER — Other Ambulatory Visit: Payer: 59

## 2023-02-02 ENCOUNTER — Ambulatory Visit: Payer: 59 | Admitting: Hematology and Oncology

## 2023-02-04 ENCOUNTER — Telehealth: Payer: Self-pay | Admitting: Nurse Practitioner

## 2023-02-05 ENCOUNTER — Other Ambulatory Visit (HOSPITAL_COMMUNITY): Payer: Self-pay

## 2023-02-08 ENCOUNTER — Other Ambulatory Visit: Payer: Self-pay

## 2023-02-08 ENCOUNTER — Other Ambulatory Visit (HOSPITAL_COMMUNITY): Payer: Self-pay

## 2023-02-09 ENCOUNTER — Inpatient Hospital Stay (HOSPITAL_BASED_OUTPATIENT_CLINIC_OR_DEPARTMENT_OTHER): Payer: 59 | Admitting: Hematology and Oncology

## 2023-02-09 ENCOUNTER — Other Ambulatory Visit (HOSPITAL_COMMUNITY): Payer: Self-pay

## 2023-02-09 ENCOUNTER — Inpatient Hospital Stay: Payer: 59 | Attending: Hematology and Oncology

## 2023-02-09 VITALS — BP 110/76 | HR 96 | Temp 97.9°F | Resp 16 | Wt 160.8 lb

## 2023-02-09 DIAGNOSIS — C7951 Secondary malignant neoplasm of bone: Secondary | ICD-10-CM | POA: Diagnosis not present

## 2023-02-09 DIAGNOSIS — Z79811 Long term (current) use of aromatase inhibitors: Secondary | ICD-10-CM | POA: Insufficient documentation

## 2023-02-09 DIAGNOSIS — C50212 Malignant neoplasm of upper-inner quadrant of left female breast: Secondary | ICD-10-CM | POA: Diagnosis not present

## 2023-02-09 DIAGNOSIS — Z170 Estrogen receptor positive status [ER+]: Secondary | ICD-10-CM

## 2023-02-09 DIAGNOSIS — C50411 Malignant neoplasm of upper-outer quadrant of right female breast: Secondary | ICD-10-CM | POA: Insufficient documentation

## 2023-02-09 DIAGNOSIS — C781 Secondary malignant neoplasm of mediastinum: Secondary | ICD-10-CM | POA: Diagnosis not present

## 2023-02-09 LAB — CBC WITH DIFFERENTIAL (CANCER CENTER ONLY)
Abs Immature Granulocytes: 0.03 10*3/uL (ref 0.00–0.07)
Basophils Absolute: 0.1 10*3/uL (ref 0.0–0.1)
Basophils Relative: 1 %
Eosinophils Absolute: 0.4 10*3/uL (ref 0.0–0.5)
Eosinophils Relative: 6 %
HCT: 36.2 % (ref 36.0–46.0)
Hemoglobin: 12 g/dL (ref 12.0–15.0)
Immature Granulocytes: 0 %
Lymphocytes Relative: 24 %
Lymphs Abs: 1.6 10*3/uL (ref 0.7–4.0)
MCH: 29.1 pg (ref 26.0–34.0)
MCHC: 33.1 g/dL (ref 30.0–36.0)
MCV: 87.7 fL (ref 80.0–100.0)
Monocytes Absolute: 0.6 10*3/uL (ref 0.1–1.0)
Monocytes Relative: 9 %
Neutro Abs: 4.1 10*3/uL (ref 1.7–7.7)
Neutrophils Relative %: 60 %
Platelet Count: 391 10*3/uL (ref 150–400)
RBC: 4.13 MIL/uL (ref 3.87–5.11)
RDW: 14.7 % (ref 11.5–15.5)
WBC Count: 6.8 10*3/uL (ref 4.0–10.5)
nRBC: 0 % (ref 0.0–0.2)

## 2023-02-09 LAB — CMP (CANCER CENTER ONLY)
ALT: 24 U/L (ref 0–44)
AST: 39 U/L (ref 15–41)
Albumin: 4.6 g/dL (ref 3.5–5.0)
Alkaline Phosphatase: 393 U/L — ABNORMAL HIGH (ref 38–126)
Anion gap: 10 (ref 5–15)
BUN: 10 mg/dL (ref 6–20)
CO2: 25 mmol/L (ref 22–32)
Calcium: 10.6 mg/dL — ABNORMAL HIGH (ref 8.9–10.3)
Chloride: 105 mmol/L (ref 98–111)
Creatinine: 0.79 mg/dL (ref 0.44–1.00)
GFR, Estimated: >60 mL/min (ref >60)
Glucose, Bld: 119 mg/dL — ABNORMAL HIGH (ref 70–99)
Potassium: 4.3 mmol/L (ref 3.5–5.1)
Sodium: 140 mmol/L (ref 135–145)
Total Bilirubin: 0.3 mg/dL (ref 0.3–1.2)
Total Protein: 8.2 g/dL — ABNORMAL HIGH (ref 6.5–8.1)

## 2023-02-09 NOTE — Assessment & Plan Note (Signed)
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  Patient refused adjuvant chemo, adjuvant radiation and adjuvant antiestrogen therapy.   Patient went to orthopedics for right hip pain.  Imaging revealed bone metastasis.  CT CAP: Cortical destruction of the right femoral neck consistent with skeletal metastases at risk for pathologic fracture.  Lucent lesion in the left iliac bone and L3 vertebral body consistent with multifocal skeletal metastases. 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   CT chest abdomen pelvis 05/07/2022: No new or progressive bone metastases.  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%, HER2 3+ positive, Ki-67 35%    Treatment plan change: Oophorectomy: 09/30/2022 Current treatment: Anastrozole Patient refused switching from Herceptin to Kadcyla (her insurance is denied Herceptin Hylecta and patient does not want to receive it in IV form), stopped Herceptin (last given 09/17/2022) Neratinib started 01/05/2023 discontinued 02/08/2023   Pain control: MS Contin and Percocets that she takes as needed, muscle relaxants have been   12/17/2022: Mammogram and ultrasound right breast: Mild interval increase in  size of the biopsy-proven newly lesion 1.9 cm   Patient is switching her insurance and she thinks that the Herceptin Hylecta will be approved by her new insurance.

## 2023-02-09 NOTE — Progress Notes (Signed)
Patient Care Team: Natalie Ferretti, DO as PCP - General (Internal Medicine) Natalie Angelica, RN as Oncology Nurse Navigator Natalie Proud, RN as Oncology Nurse Navigator  DIAGNOSIS:  Encounter Diagnosis  Name Primary?   Malignant neoplasm of upper-inner quadrant of left breast in female, estrogen receptor positive (HCC) Yes    SUMMARY OF ONCOLOGIC HISTORY: Oncology History  Malignant neoplasm of upper-inner quadrant of left breast in female, estrogen receptor positive (HCC)  04/11/2020 Initial Diagnosis   Patient palpated a left breast lump. Mammogram on 03/08/20 showed a 3.0cm mass at the 11 o'clock position with no axillary adenopathy. 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    02/2021 Relapse/Recurrence   Patient went to orthopedics for right hip pain.  Imaging revealed bone metastasis.  CT CAP: Cortical destruction of the right femoral neck consistent with skeletal metastases at risk for pathologic fracture.  Lucent lesion in the left iliac bone and L3 vertebral body consistent with multifocal skeletal metastases.   04/01/2021 Surgery   04/01/2021: Femoral intramedullary nail  Pathology review: Metastatic breast cancer ER 2% PR 0% HER2 3+ positive   04/08/2021 -  Anti-estrogen oral therapy   Tamoxifen 20 mg was prescribed on 04/08/2021 (discontinued by patient because she felt that the risks and benefits did not support it )--resumed in 07/2022 based on pathology results from right breast biopsy   05/14/2021 -  Chemotherapy   Subcutaneous Herceptin  Perjeta (Phesgo) injection started 05/14/2021, switched to Herceptin Hylecta 06/11/2021 (Fixed drug eruption, profound diarrhea to Phesgo)     05/20/2021 - 06/03/2021 Radiation Therapy   Palliative radiation to the iliac bone and hip   02/04/2022 Imaging   MRI thoracic spine 02/04/2022: Numerous bone metastases thoracic spine cervical and lumbar spines largest T5, T8, T11.  Chronic compression fractures T4, T5, T10 and T12    05/07/2022 Imaging   CT chest abdomen pelvis: No new or progressive bone metastases.  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   07/29/2022 PET scan   IMPRESSION: 1. Hypermetabolic mass in the posterior deep RIGHT breast consistent primary breast carcinoma. 2. Central nodal mediastinal metastasis to the high LEFT prevascular space and LEFT super clavicular node. 3. 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  [name] recommended changing treatment to Kadcyla and [name] wanted to wait until after the holidays.  She will discuss further with Dr. Pamelia Hoit in January.   11/19/2022 - 11/19/2022 Chemotherapy   Patient is on Treatment Plan : BREAST Trastuzumab IV (8/6) or SQ (600) D1 q21d       CHIEF COMPLIANT: Follow-up on metastatic breast cancer   INTERVAL HISTORY: Natalie Griffin is a 47 year old with a history of left breast cancer. Currently on anastrozole. She presents to the clinic for a follow-up. She reports that she stopped taking the medicine Neratinib on  15th and had a solid bm on the 18th. She states that she is having a very sharp pain in the right breast. She is still having pain in right hip and she says it is intense. It has been going on for 2 months.   ALLERGIES:  is allergic to adhesive  [tape], fentanyl, phesgo [pertuz-trastuz-hyaluron-zzxf], tetanus toxoid, and succinylcholine.  MEDICATIONS:  Current Outpatient Medications  Medication Sig Dispense Refill   ALPRAZolam (XANAX) 0.5 MG tablet Take 1 tablet (0.5 mg total) by mouth 3 (three) times daily as needed for anxiety. 60 tablet 3   anastrozole (ARIMIDEX) 1 MG tablet Take 1 tablet (1 mg total) by mouth daily. 90 tablet 3   bisacodyl (DULCOLAX) 10 MG suppository Place 1 suppository (10 mg total) rectally daily as needed for up to 7 doses for moderate constipation. 12 suppository 0   docusate sodium (COLACE) 100 MG capsule Take 1 capsule (100 mg total) by mouth 2 (two) times daily. 10 capsule 0   morphine (MS CONTIN) 30 MG 12 hr tablet Take 1 tablet (30 mg total) by mouth every 12 (twelve) hours. 60 tablet 0   oxyCODONE-acetaminophen (PERCOCET) 10-325 MG tablet Take 1 tablet by mouth every 8 (eight) hours as needed for pain. 90 tablet 0   No current facility-administered medications for this visit.    PHYSICAL EXAMINATION: ECOG PERFORMANCE STATUS: 1 - Symptomatic but completely ambulatory  Vitals:   02/09/23 1515  BP: 110/76  Pulse: 96  Resp: 16  Temp: 97.9 F (36.6 C)  SpO2: 98%   Filed Weights   02/09/23 1515  Weight: 160 lb 12.8 oz (72.9 kg)      LABORATORY DATA:  I have reviewed the data as listed    Latest Ref Rng & Units 02/09/2023    2:48 PM 12/31/2022    8:04 AM 12/29/2022    4:52 PM  CMP  Glucose 70 - 99 mg/dL 409  99  811   BUN 6 - 20 mg/dL 10  6  12    Creatinine 0.44 - 1.00 mg/dL 9.14  7.82  9.56   Sodium 135 - 145 mmol/L 140  138  135   Potassium 3.5 - 5.1 mmol/L 4.3  4.0  3.4   Chloride 98 - 111 mmol/L 105  107  101   CO2 22 - 32 mmol/L 25  22  23    Calcium 8.9 - 10.3 mg/dL 21.3  9.3  9.9   Total Protein 6.5 - 8.1 g/dL 8.2  7.1  8.4   Total Bilirubin 0.3 - 1.2 mg/dL 0.3  0.3  0.6   Alkaline Phos 38 - 126 U/L 393  284  345   AST 15 - 41 U/L 39  36  38   ALT 0 - 44 U/L 24  24  25       Lab Results  Component Value Date   WBC 6.8 02/09/2023   HGB 12.0 02/09/2023   HCT 36.2 02/09/2023   MCV 87.7 02/09/2023   PLT 391 02/09/2023   NEUTROABS 4.1 02/09/2023    ASSESSMENT & PLAN:  Malignant neoplasm of upper-inner quadrant of left breast in female, estrogen receptor positive (HCC) 06/04/2020:Left lumpectomy ([name]): invasive and in situ ductal carcinoma, 3.2cm, clear margins, one left axillary lymph node negative for carcinoma.  Patient refused adjuvant chemo, adjuvant radiation and adjuvant antiestrogen therapy.   Patient went to orthopedics for right hip pain.  Imaging revealed bone metastasis.  CT CAP: Cortical destruction of the right femoral neck consistent  with skeletal metastases at risk for pathologic fracture.  Lucent lesion in the left iliac bone and L3 vertebral body consistent with multifocal skeletal metastases. 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   CT chest abdomen pelvis 05/07/2022: No new or progressive bone metastases.  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%, HER2 3+ positive, Ki-67 35%    Treatment plan change: Oophorectomy: 09/30/2022 Current treatment: Anastrozole Patient refused switching from Herceptin to Kadcyla (her insurance is denied Herceptin Hylecta and patient  does not want to receive it in IV form), stopped Herceptin (last given 09/17/2022) Neratinib started 01/05/2023 discontinued 02/08/2023   Pain control: MS Contin and Percocets that she takes as needed, muscle relaxants have been   12/17/2022: Mammogram and ultrasound right breast: Mild interval increase in size of the biopsy-proven newly lesion 1.9 cm: I will send a message to Dr. Luisa Hart because she is experiencing a lot of pain and discomfort and would like to undergo another lumpectomy to get rid of the breast cancer.  She understands that there is no survival advantage for the surgery but she is doing it for palliation.   Patient is switching her insurance and she hopes that the Herceptin Hylecta will be approved.  Patient may also be interested in applying for free drug. Elevated alkaline phosphatase: I discussed with her that it is related to her bone metastases and hypercalcemia.  She does not want to take Xgeva.  And she will not receive infusions and therefore Zometa is out of question.  Our plan is to obtain scans in August after she returns back from [place]. Follow up after that.   Orders Placed This Encounter  Procedures   MR Pelvis W Wo Contrast    Standing Status:   Future    Standing Expiration Date:   02/09/2024    Order Specific Question:   If indicated for the ordered procedure, I authorize the administration of contrast media per Radiology protocol    Answer:   Yes    Order Specific Question:   What is the patient's sedation requirement?    Answer:   No Sedation    Order Specific Question:   Does the patient have a pacemaker or implanted devices?    Answer:   No    Order Specific Question:   Preferred imaging location?    Answer:   [facility] (table limit-550lbs)    Order Specific Question:   Release to patient    Answer:   Immediate   CT CHEST ABDOMEN PELVIS W CONTRAST    Standing Status:   Future    Standing Expiration Date:   02/09/2024    Order Specific  Question:   If indicated for the ordered procedure, I authorize the administration of contrast media per Radiology protocol    Answer:   Yes    Order Specific Question:   Does the patient have a contrast media/X-ray dye allergy?  Answer:   No    Order Specific Question:   Is patient pregnant?    Answer:   No    Order Specific Question:   Preferred imaging location?    Answer:   [facility]    Order Specific Question:   If indicated for the ordered procedure, I authorize the administration of oral contrast media per Radiology protocol    Answer:   Yes   The patient has a good understanding of the overall plan. she agrees with it. she will call with any problems that may develop before the next visit here. Total time spent: 30 mins including face to face time and time spent for planning, charting and co-ordination of care   Tamsen Meek, MD 02/09/23    I Janan Ridge am acting as a Neurosurgeon for [employer]  I have reviewed the above documentation for accuracy and completeness, and I agree with the above.

## 2023-02-10 ENCOUNTER — Other Ambulatory Visit (HOSPITAL_COMMUNITY): Payer: Self-pay

## 2023-02-10 ENCOUNTER — Other Ambulatory Visit: Payer: Self-pay

## 2023-02-11 ENCOUNTER — Telehealth: Payer: Self-pay | Admitting: Hematology and Oncology

## 2023-02-11 NOTE — Telephone Encounter (Signed)
Scheduled appointment per los. Left voicemail. 

## 2023-02-18 ENCOUNTER — Other Ambulatory Visit: Payer: Self-pay

## 2023-02-20 ENCOUNTER — Telehealth: Payer: Self-pay | Admitting: Nurse Practitioner

## 2023-02-20 NOTE — Telephone Encounter (Signed)
Called patient phone went straight to vm. Left patient a vm regarding upcoming appointment

## 2023-02-22 ENCOUNTER — Encounter: Payer: Self-pay | Admitting: Hematology and Oncology

## 2023-02-22 ENCOUNTER — Other Ambulatory Visit: Payer: Self-pay | Admitting: *Deleted

## 2023-02-22 DIAGNOSIS — C7951 Secondary malignant neoplasm of bone: Secondary | ICD-10-CM

## 2023-02-22 DIAGNOSIS — M84551G Pathological fracture in neoplastic disease, right femur, subsequent encounter for fracture with delayed healing: Secondary | ICD-10-CM

## 2023-02-22 NOTE — Progress Notes (Signed)
Pt requesting MRI pelvis to be completed at a [facility] facility.  Orders canceled for GI and updated orders placed.

## 2023-02-23 ENCOUNTER — Ambulatory Visit (HOSPITAL_COMMUNITY)
Admission: RE | Admit: 2023-02-23 | Discharge: 2023-02-23 | Disposition: A | Discharge status: Home / Self Care | Payer: 59 | Source: Ambulatory Visit | Attending: Hematology and Oncology | Admitting: Hematology and Oncology

## 2023-02-23 ENCOUNTER — Ambulatory Visit (HOSPITAL_COMMUNITY): Payer: 59

## 2023-02-23 DIAGNOSIS — D251 Intramural leiomyoma of uterus: Secondary | ICD-10-CM | POA: Diagnosis not present

## 2023-02-23 DIAGNOSIS — M84551G Pathological fracture in neoplastic disease, right femur, subsequent encounter for fracture with delayed healing: Secondary | ICD-10-CM | POA: Insufficient documentation

## 2023-02-23 DIAGNOSIS — C7951 Secondary malignant neoplasm of bone: Secondary | ICD-10-CM

## 2023-02-23 MED ORDER — GADOBUTROL 1 MMOL/ML IV SOLN
7.0000 mL | Freq: Once | INTRAVENOUS | Status: AC | PRN
  Administered 2023-02-23: 7 mL via INTRAVENOUS

## 2023-02-26 NOTE — Progress Notes (Signed)
Notified Patient by email of completion of Medical Certification Form for Accommodation Request. Fax transmission confirmation received. Copy of form emailed to Patient as requested.

## 2023-03-01 DIAGNOSIS — D649 Anemia, unspecified: Secondary | ICD-10-CM | POA: Diagnosis not present

## 2023-03-01 DIAGNOSIS — Z0000 Encounter for general adult medical examination without abnormal findings: Secondary | ICD-10-CM | POA: Diagnosis not present

## 2023-03-01 DIAGNOSIS — R7989 Other specified abnormal findings of blood chemistry: Secondary | ICD-10-CM | POA: Diagnosis not present

## 2023-03-02 ENCOUNTER — Inpatient Hospital Stay: Payer: 59

## 2023-03-03 NOTE — Progress Notes (Signed)
Telephone visit: I tried the number that is on file and it does along to the patient. Therefore I was not able to connect with the patient to discuss the results of the pelvic MRI.

## 2023-03-04 ENCOUNTER — Other Ambulatory Visit (HOSPITAL_COMMUNITY): Payer: Self-pay

## 2023-03-04 DIAGNOSIS — D649 Anemia, unspecified: Secondary | ICD-10-CM | POA: Diagnosis not present

## 2023-03-04 DIAGNOSIS — Z1331 Encounter for screening for depression: Secondary | ICD-10-CM | POA: Diagnosis not present

## 2023-03-04 DIAGNOSIS — F988 Other specified behavioral and emotional disorders with onset usually occurring in childhood and adolescence: Secondary | ICD-10-CM | POA: Diagnosis not present

## 2023-03-04 DIAGNOSIS — G8929 Other chronic pain: Secondary | ICD-10-CM | POA: Diagnosis not present

## 2023-03-04 DIAGNOSIS — C7951 Secondary malignant neoplasm of bone: Secondary | ICD-10-CM | POA: Diagnosis not present

## 2023-03-04 DIAGNOSIS — Z0000 Encounter for general adult medical examination without abnormal findings: Secondary | ICD-10-CM | POA: Diagnosis not present

## 2023-03-04 DIAGNOSIS — R7301 Impaired fasting glucose: Secondary | ICD-10-CM | POA: Diagnosis not present

## 2023-03-04 DIAGNOSIS — M84453D Pathological fracture, unspecified femur, subsequent encounter for fracture with routine healing: Secondary | ICD-10-CM | POA: Diagnosis not present

## 2023-03-04 DIAGNOSIS — C50919 Malignant neoplasm of unspecified site of unspecified female breast: Secondary | ICD-10-CM | POA: Diagnosis not present

## 2023-03-04 MED ORDER — AMPHETAMINE-DEXTROAMPHETAMINE 10 MG PO TABS
5.0000 mg | ORAL_TABLET | Freq: Two times a day (BID) | ORAL | 0 refills | Status: DC | PRN
  Filled 2023-03-04: qty 60, 30d supply, fill #0

## 2023-03-08 ENCOUNTER — Other Ambulatory Visit: Payer: 59

## 2023-03-09 ENCOUNTER — Ambulatory Visit: Payer: 59 | Admitting: Hematology and Oncology

## 2023-03-11 ENCOUNTER — Other Ambulatory Visit (HOSPITAL_COMMUNITY): Payer: Self-pay

## 2023-03-11 ENCOUNTER — Other Ambulatory Visit: Payer: Self-pay | Admitting: Hematology and Oncology

## 2023-03-11 ENCOUNTER — Inpatient Hospital Stay: Payer: 59 | Attending: Hematology and Oncology | Admitting: Hematology and Oncology

## 2023-03-11 DIAGNOSIS — C50212 Malignant neoplasm of upper-inner quadrant of left female breast: Secondary | ICD-10-CM

## 2023-03-11 DIAGNOSIS — Z170 Estrogen receptor positive status [ER+]: Secondary | ICD-10-CM

## 2023-03-11 MED ORDER — MORPHINE SULFATE ER 30 MG PO TBCR
30.0000 mg | EXTENDED_RELEASE_TABLET | Freq: Two times a day (BID) | ORAL | 0 refills | Status: DC
  Filled 2023-03-11: qty 60, 30d supply, fill #0

## 2023-03-11 MED ORDER — OXYCODONE-ACETAMINOPHEN 10-325 MG PO TABS
1.0000 | ORAL_TABLET | Freq: Three times a day (TID) | ORAL | 0 refills | Status: DC | PRN
  Filled 2023-03-11: qty 90, 30d supply, fill #0

## 2023-03-11 NOTE — Assessment & Plan Note (Signed)
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  Patient refused adjuvant chemo, adjuvant radiation and adjuvant antiestrogen therapy.   Patient went to orthopedics for right hip pain.  Imaging revealed bone metastasis.  CT CAP: Cortical destruction of the right femoral neck consistent with skeletal metastases at risk for pathologic fracture.  Lucent lesion in the left iliac bone and L3 vertebral body consistent with multifocal skeletal metastases. 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   CT chest abdomen pelvis 05/07/2022: No new or progressive bone metastases.  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%, HER2 3+ positive, Ki-67 35%    Treatment plan change: Oophorectomy: 09/30/2022 Current treatment: Anastrozole Patient refused switching from Herceptin to Kadcyla (her insurance is denied Herceptin Hylecta and patient does not want to receive it in IV form), stopped Herceptin (last given 09/17/2022) Neratinib started 01/05/2023 discontinued 02/08/2023   Pain control: MS Contin and Percocets that she takes as needed, muscle relaxants have been   12/17/2022: Mammogram and ultrasound right breast: Mild interval increase in  size of the biopsy-proven newly lesion 1.9 cm: I will send a message to Dr. Luisa Hart because she is experiencing a lot of pain and discomfort and would like to undergo another lumpectomy to get rid of the breast cancer.  She understands that there is no survival advantage for the surgery but she is doing it for palliation.   Patient is switching her insurance and she hopes that the Herceptin Hylecta will be approved.  Patient may also be interested in applying for free drug. Elevated alkaline phosphatase: I discussed with her that it is related to her bone metastases and hypercalcemia.  She does not want to take Xgeva.  And she will not receive infusions and therefore Zometa is out of question.    MRI pelvis 03/08/2023: Multiple enhancing metastatic lesions left femoral head and acetabulum, left iliac wing, left femoral neck, left greater trochanter and lesser trochanter.  Discussion: Refer the patient to radiation  Our plan is to obtain scans in August after she returns back from [place]. Follow up after that.

## 2023-03-12 ENCOUNTER — Other Ambulatory Visit (HOSPITAL_COMMUNITY): Payer: Self-pay

## 2023-03-12 ENCOUNTER — Other Ambulatory Visit: Payer: Self-pay

## 2023-03-12 NOTE — Progress Notes (Unsigned)
Palliative Medicine [facility]  [phone] [phone]   Name: Natalie Griffin Date: 03/12/2023 MRN: [id number]  DOB: 01/10/1976  Patient Care Team: Natalie Ferretti, DO as PCP - General (Internal Medicine) Natalie Angelica, RN as Oncology Nurse Navigator Natalie Proud, RN as Oncology Nurse Navigator Natalie Moulds, MD as Consulting Physician (Hematology and Oncology)    REASON FOR CONSULTATION: Natalie Griffin is a 47 y.o. 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  Palliative ask to see for symptom management and goals of care.    SOCIAL HISTORY:     reports that she quit smoking about 2 years ago. Her smoking use included cigarettes. She has never used smokeless tobacco. She reports that she does not currently use alcohol. She reports that she does not use drugs.  ADVANCE DIRECTIVES:  AD on file naming Natalie Griffin as first healthcare proxy and Natalie Griffin as second. DNR also on file.   CODE STATUS: DNR  PAST MEDICAL HISTORY: Past Medical History:  Diagnosis Date   Anemia    Anxiety    Bone metastasis    Breast cancer (HCC) 2021   Left breast invasive ductal carcinoma   Cancer (HCC) 2021   Left breast   Family history of adverse reaction to anesthesia    Father and Aunt have pseudocholinesterase deficiency - Trouble waking up from anesthesia   History of hiatal hernia    History of kidney stones    noted on CT Left nonobstructive    PAST SURGICAL HISTORY:  Past Surgical History:  Procedure Laterality Date   BREAST BIOPSY Right 07/21/2022   [place] RT BREAST BX W LOC DEV 1ST LESION IMG BX SPEC US GUIDE 07/21/2022 [facility] MAMMOGRAPHY   BREAST LUMPECTOMY WITH RADIOACTIVE SEED AND SENTINEL LYMPH NODE BIOPSY Left 06/04/2020   Procedure: LEFT BREAST LUMPECTOMY WITH RADIOACTIVE SEED AND  SENTINEL LYMPH NODE MAPPING;  Surgeon: Natalie Bouillon, MD;  Location: [facility] OR;  Service: General;  Laterality: Left;  PEC BLOCK, RNFA   FEMUR IM NAIL Right 04/01/2021   Procedure: INTRAMEDULLARY (IM) NAIL FEMORAL;  Surgeon: Natalie Apley, MD;  Location: [facility] ORS;  Service: Orthopedics;  Laterality: Right;   ROBOTIC ASSISTED BILATERAL SALPINGO OOPHERECTOMY Bilateral 09/30/2022   Procedure: XI ROBOTIC ASSISTED BILATERAL SALPINGO OOPHORECTOMY;  Surgeon: Natalie Fila, MD;  Location: [facility];  Service: Gynecology;  Laterality: Bilateral;   WISDOM TOOTH EXTRACTION      HEMATOLOGY/ONCOLOGY HISTORY:  Oncology History  Malignant neoplasm of upper-inner quadrant of left breast in female, estrogen receptor positive (HCC)  04/11/2020 Initial Diagnosis   Patient palpated a left breast lump. Mammogram on 03/08/20 showed a 3.0cm mass at the 11 o'clock position with no axillary adenopathy. 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   05/09/2020 Cancer Staging   Staging form: Breast, AJCC 8th Edition - Clinical stage from 05/09/2020: Stage IB (cT2, cN0, cM0, G2, ER+, PR+, HER2+) - Signed by Natalie Croissant, MD on 05/09/2020   06/04/2020 Surgery   Left lumpectomy ([name]): invasive and in situ ductal carcinoma, 3.2cm, clear margins, one left axillary lymph node negative for carcinoma.    02/2021 Relapse/Recurrence   Patient went to orthopedics for right hip pain.  Imaging revealed bone metastasis.  CT CAP: Cortical destruction of the right femoral  neck consistent with skeletal metastases at risk for pathologic fracture.  Lucent lesion in the left iliac bone and L3 vertebral body consistent with multifocal skeletal metastases.   04/01/2021 Surgery   04/01/2021: Femoral intramedullary nail  Pathology review: Metastatic breast cancer ER 2% PR 0% HER2 3+ positive   04/08/2021 -  Anti-estrogen oral therapy   Tamoxifen 20 mg was prescribed  on 04/08/2021 (discontinued by patient because she felt that the risks and benefits did not support it )--resumed in 07/2022 based on pathology results from right breast biopsy   05/14/2021 -  Chemotherapy   Subcutaneous Herceptin Perjeta (Phesgo) injection started 05/14/2021, switched to Herceptin Hylecta 06/11/2021 (Fixed drug eruption, profound diarrhea to Phesgo)     05/20/2021 - 06/03/2021 Radiation Therapy   Palliative radiation to the iliac bone and hip   02/04/2022 Imaging   MRI thoracic spine 02/04/2022: Numerous bone metastases thoracic spine cervical and lumbar spines largest T5, T8, T11.  Chronic compression fractures T4, T5, T10 and T12    05/07/2022 Imaging   CT chest abdomen pelvis: No new or progressive bone metastases.  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   07/29/2022 PET scan   IMPRESSION: 1. Hypermetabolic mass in the posterior deep RIGHT breast consistent primary breast carcinoma. 2. Central nodal mediastinal metastasis to the high LEFT prevascular space and LEFT super clavicular node. 3. 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  Natalie Griffin recommended changing treatment to [place] and Sebrina wanted to wait until after the holidays.  She will discuss further with Natalie Griffin in January.   11/19/2022 - 11/19/2022 Chemotherapy   Patient is on Treatment Plan : BREAST Trastuzumab IV (8/6) or SQ (600) D1 q21d       ALLERGIES:  is allergic to adhesive [tape], fentanyl, phesgo [pertuz-trastuz-hyaluron-zzxf], tetanus toxoid, and succinylcholine.  MEDICATIONS:  Current Outpatient Medications  Medication Sig Dispense Refill   ALPRAZolam (XANAX) 0.5 MG tablet Take 1 tablet (0.5 mg total) by mouth 3 (three)  times daily as needed for anxiety. 60 tablet 3   amphetamine-dextroamphetamine (ADDERALL) 10 MG tablet Take 0.5-1 tablets (5-10 mg total) by mouth 2 (two) times daily as needed. 60 tablet 0   anastrozole (ARIMIDEX) 1 MG tablet Take 1 tablet (1 mg total) by mouth daily. 90 tablet 3   bisacodyl (DULCOLAX) 10 MG suppository Place 1 suppository (10 mg total) rectally daily as needed for up to 7 doses for moderate constipation. 12 suppository 0   docusate sodium (COLACE) 100 MG capsule Take 1 capsule (100 mg total) by mouth 2 (two) times daily. 10 capsule 0   morphine (MS CONTIN) 30 MG 12 hr tablet Take 1 tablet (30 mg total) by mouth every 12 (twelve) hours. 60 tablet 0   oxyCODONE-acetaminophen (PERCOCET) 10-325 MG tablet Take 1 tablet by mouth every 8 (eight) hours as needed for pain. 90 tablet 0   No current facility-administered medications for this visit.    VITAL SIGNS: There were no vitals taken for this visit. There were no vitals filed for this visit.  Estimated body mass index is 26.76 kg/m as calculated from the following:   Height as of 11/24/22: 5\' 5"  (1.651 m).   Weight as of 02/09/23: 160 lb 12.8 oz (72.9 kg).  LABS:  CBC:    Component Value Date/Time   WBC 6.8 02/09/2023 1448   WBC 5.3 12/31/2022 0804   HGB 12.0 02/09/2023 1448   HCT 36.2 02/09/2023 1448   PLT 391 02/09/2023 1448   MCV 87.7 02/09/2023 1448   NEUTROABS 4.1 02/09/2023 1448   LYMPHSABS 1.6 02/09/2023 1448   MONOABS 0.6 02/09/2023 1448   EOSABS 0.4 02/09/2023 1448   BASOSABS 0.1 02/09/2023 1448   Comprehensive Metabolic Panel:    Component Value Date/Time   NA 140 02/09/2023 1448   K 4.3 02/09/2023 1448   CL 105 02/09/2023 1448   CO2 25 02/09/2023 1448   BUN 10 02/09/2023 1448   CREATININE 0.79 02/09/2023 1448   GLUCOSE 119 (H) 02/09/2023 1448   CALCIUM 10.6 (H) 02/09/2023 1448   AST 39 02/09/2023 1448   ALT 24 02/09/2023 1448   ALKPHOS 393 (H) 02/09/2023 1448   BILITOT 0.3 02/09/2023 1448   PROT  8.2 (H) 02/09/2023 1448   ALBUMIN 4.6 02/09/2023 1448    RADIOGRAPHIC STUDIES: MR Pelvis W Wo Contrast  Addendum Date: 03/08/2023   ADDENDUM REPORT: 03/08/2023 07:58 ADDENDUM: There are multiple enhancing metastatic lesions in the left femoral head and acetabulum, left iliac wing, left femoral neck, left greater trochanter and lesser trochanters. Electronically Signed   By: Larose Hires D.O.   On: 03/08/2023 07:58   Result Date: 03/08/2023 CLINICAL DATA:  Metastatic disease evaluation. EXAM: MRI PELVIS WITHOUT AND WITH CONTRAST TECHNIQUE: Multiplanar multisequence MR imaging of the pelvis was performed both before and after administration of intravenous contrast. CONTRAST:  7mL GADAVIST GADOBUTROL 1 MMOL/ML IV SOLN COMPARISON:  CT examination dated December 29, 2022 FINDINGS: Musculoskeletal: There are numerous T1 hypointense and T2 hyperintense enhancing lesions throughout the pelvic bones and bilateral proximal femurs and visualized lower lumbar spine consistent with diffuse widespread metastatic disease. Status post right hip cephalomedullary nail placement. No marrow edema suggest acute fracture. No appreciable joint effusion. Urinary Tract:  No abnormality visualized. Bowel:  Unremarkable visualized pelvic bowel loops. Vascular/Lymphatic: No pathologically enlarged lymph nodes. No significant vascular abnormality seen. Reproductive:  Uterine fibroid.  No adnexal mass. Other:  None. IMPRESSION: 1. Diffuse widespread metastatic disease throughout the pelvic bones and visualized lower lumbar spine. No pathologic fracture. 2. Status post right hip cephalomedullary nail placement without evidence of periprosthetic fracture. Electronically Signed: By: Larose Hires D.O. On: 02/25/2023 12:20    PERFORMANCE STATUS (ECOG) : {CHL ONC ECOG VH:[id number]}  Review of Systems Unless otherwise noted, a complete review of systems is negative.  Physical Exam General: NAD Cardiovascular: regular rate and  rhythm Pulmonary: clear ant fields Abdomen: soft, nontender, + bowel sounds Extremities: no edema, no joint deformities Skin: no rashes Neurological:  IMPRESSION: *** I introduced myself, Regina Ganci RN, and Palliative's role in collaboration with the oncology team. Concept of Palliative Care was introduced as specialized medical care for people and their families living with serious illness.  It focuses on providing relief from the symptoms and stress of a serious illness.  The goal is to improve quality of life for both the patient and the family. Values and goals of care important to patient and family were attempted to be elicited.    We discussed *** current illness and what it means in the larger context of *** on-going co-morbidities. Natural disease trajectory and expectations were discussed.  I discussed the importance of continued conversation with family and their medical providers regarding overall plan of care and treatment options, ensuring decisions are  within the context of the patients values and GOCs.  PLAN: Established therapeutic relationship. Education provided on palliative's role in collaboration with their Oncology/Radiation team. I will plan to see patient back in 2-4 weeks in collaboration to other oncology appointments.    Patient expressed understanding and was in agreement with this plan. She also understands that She can call the clinic at any time with any questions, concerns, or complaints.   Thank you for your referral and allowing Palliative to assist in Mrs. Olie A Adamski's care.   Number and complexity of problems addressed: ***HIGH - 1 or more chronic illnesses with SEVERE exacerbation, progression, or side effects of treatment - advanced cancer, pain. Any controlled substances utilized were prescribed in the context of palliative care.   Visit consisted of counseling and education dealing with the complex and emotionally intense issues of symptom  management and palliative care in the setting of serious and potentially life-threatening illness.Greater than 50%  of this time was spent counseling and coordinating care related to the above assessment and plan.  Signed by: Willette Alma, AGPCNP-BC Palliative Medicine Team/[facility]   *Please note that this is a verbal dictation therefore any spelling or grammatical errors are due to the "Dragon Medical One" system interpretation.

## 2023-03-15 ENCOUNTER — Telehealth: Payer: Self-pay

## 2023-03-15 NOTE — Telephone Encounter (Signed)
Patient called and LVM to cancel palliative care appt, apt canceled, palliative care available to be re-ordered by oncology team should patient desire to reschedule.

## 2023-03-16 ENCOUNTER — Inpatient Hospital Stay: Payer: 59

## 2023-03-18 ENCOUNTER — Encounter: Payer: Self-pay | Admitting: Hematology and Oncology

## 2023-03-19 DIAGNOSIS — C50512 Malignant neoplasm of lower-outer quadrant of left female breast: Secondary | ICD-10-CM | POA: Diagnosis not present

## 2023-03-19 DIAGNOSIS — C7951 Secondary malignant neoplasm of bone: Secondary | ICD-10-CM | POA: Diagnosis not present

## 2023-03-19 NOTE — Progress Notes (Signed)
Radiation Oncology         [phone] ________________________________  Initial outpatient Consultation  Name: Natalie Griffin MRN: [id number]  Date of Service: 03/22/2023 DOB: 1976/03/23  WU:[id number], Eliberto Ivory, DO  Serena Croissant, MD   REFERRING PHYSICIAN: Serena Croissant, MD  DIAGNOSIS: There were no encounter diagnoses.  No diagnosis found.  HISTORY OF PRESENT ILLNESS: Natalie Griffin is a 47 y.o. female seen at the request of Dr. Pamelia Hoit.  PREVIOUS RADIATION THERAPY: {EXAM; YES/NO:19492::"No"}  PAST MEDICAL HISTORY:  Past Medical History:  Diagnosis Date   Anemia    Anxiety    Bone metastasis    Breast cancer (HCC) 2021   Left breast invasive ductal carcinoma   Cancer (HCC) 2021   Left breast   Family history of adverse reaction to anesthesia    Father and Aunt have pseudocholinesterase deficiency - Trouble waking up from anesthesia   History of hiatal hernia    History of kidney stones    noted on CT Left nonobstructive      PAST SURGICAL HISTORY: Past Surgical History:  Procedure Laterality Date   BREAST BIOPSY Right 07/21/2022   [place] RT BREAST BX W LOC DEV 1ST LESION IMG BX SPEC US GUIDE 07/21/2022 [facility] MAMMOGRAPHY   BREAST LUMPECTOMY WITH RADIOACTIVE SEED AND SENTINEL LYMPH NODE BIOPSY Left 06/04/2020   Procedure: LEFT BREAST LUMPECTOMY WITH RADIOACTIVE SEED AND SENTINEL LYMPH NODE MAPPING;  Surgeon: Harriette Bouillon, MD;  Location: [facility] OR;  Service: General;  Laterality: Left;  PEC BLOCK, RNFA   FEMUR IM NAIL Right 04/01/2021   Procedure: INTRAMEDULLARY (IM) NAIL FEMORAL;  Surgeon: Sheral Apley, MD;  Location: [facility];  Service: Orthopedics;  Laterality: Right;   ROBOTIC ASSISTED BILATERAL SALPINGO OOPHERECTOMY Bilateral 09/30/2022   Procedure: XI ROBOTIC ASSISTED BILATERAL SALPINGO OOPHORECTOMY;  Surgeon: Carver Fila, MD;  Location: [facility];  Service: Gynecology;  Laterality: Bilateral;   WISDOM TOOTH EXTRACTION      FAMILY HISTORY:  Family History   Problem Relation Age of Onset   Colon cancer Neg Hx    Esophageal cancer Neg Hx    Stomach cancer Neg Hx    Rectal cancer Neg Hx     SOCIAL HISTORY:  Social History   Socioeconomic History   Marital status: Single    Spouse name: Not on file   Number of children: Not on file   Years of education: Not on file   Highest education level: Not on file  Occupational History   Not on file  Tobacco Use   Smoking status: Former    Types: Cigarettes    Quit date: 05/19/2020    Years since quitting: 2.8   Smokeless tobacco: Never  Vaping Use   Vaping Use: Never used  Substance and Sexual Activity   Alcohol use: Not Currently   Drug use: Never   Sexual activity: Not Currently  Other Topics Concern   Not on file  Social History Narrative   Not on file   Social Determinants of Health   Financial Resource Strain: Not on file  Food Insecurity: Not on file  Transportation Needs: Not on file  Physical Activity: Not on file  Stress: Not on file  Social Connections: Not on file  Intimate Partner Violence: Not At Risk (05/06/2021)   Humiliation, Afraid, Rape, and Kick questionnaire    Fear of Current or Ex-Partner: No    Emotionally Abused: No    Physically Abused: No    Sexually Abused: No  ALLERGIES: Adhesive [tape], Fentanyl, Phesgo [pertuz-trastuz-hyaluron-zzxf], Tetanus toxoid, and Succinylcholine  MEDICATIONS:  Current Outpatient Medications  Medication Sig Dispense Refill   ALPRAZolam (XANAX) 0.5 MG tablet Take 1 tablet (0.5 mg total) by mouth 3 (three) times daily as needed for anxiety. 60 tablet 3   amphetamine-dextroamphetamine (ADDERALL) 10 MG tablet Take 0.5-1 tablets (5-10 mg total) by mouth 2 (two) times daily as needed. 60 tablet 0   anastrozole (ARIMIDEX) 1 MG tablet Take 1 tablet (1 mg total) by mouth daily. 90 tablet 3   bisacodyl (DULCOLAX) 10 MG suppository Place 1 suppository (10 mg total) rectally daily as needed for up to 7 doses for moderate  constipation. 12 suppository 0   docusate sodium (COLACE) 100 MG capsule Take 1 capsule (100 mg total) by mouth 2 (two) times daily. 10 capsule 0   morphine (MS CONTIN) 30 MG 12 hr tablet Take 1 tablet (30 mg total) by mouth every 12 (twelve) hours. 60 tablet 0   oxyCODONE-acetaminophen (PERCOCET) 10-325 MG tablet Take 1 tablet by mouth every 8 (eight) hours as needed for pain. 90 tablet 0   No current facility-administered medications for this encounter.    REVIEW OF SYSTEMS:  On review of systems, the patient reports that *** is doing well overall. *** denies any chest pain, shortness of breath, cough, fevers, chills, night sweats, unintended weight changes. *** denies any bowel or bladder disturbances, and denies abdominal pain, nausea or vomiting. *** denies any new musculoskeletal or joint aches or pains. A complete review of systems is obtained and is otherwise negative.    PHYSICAL EXAM:  Wt Readings from Last 3 Encounters:  02/09/23 160 lb 12.8 oz (72.9 kg)  12/29/22 174 lb 2.6 oz (79 kg)  11/24/22 174 lb 9.6 oz (79.2 kg)   Temp Readings from Last 3 Encounters:  02/09/23 97.9 F (36.6 C) (Temporal)  12/31/22 98.5 F (36.9 C) (Oral)  11/24/22 (!) 97.5 F (36.4 C) (Temporal)   BP Readings from Last 3 Encounters:  02/09/23 110/76  12/31/22 131/86  11/24/22 124/84   Pulse Readings from Last 3 Encounters:  02/09/23 96  12/31/22 76  11/24/22 (!) 118    /10  In general this is a well appearing *** in no acute distress. *** is alert and oriented x4 and appropriate throughout the examination. HEENT reveals that the patient is normocephalic, atraumatic. EOMs are intact. PERRLA. Skin is intact without any evidence of gross lesions. Cardiovascular exam reveals a regular rate and rhythm, no clicks rubs or murmurs are auscultated. Chest is clear to auscultation bilaterally. Lymphatic assessment is performed and does not reveal any adenopathy in the cervical, supraclavicular, axillary,  or inguinal chains. Abdomen has active bowel sounds in all quadrants and is intact. The abdomen is soft, non tender, non distended. Lower extremities are negative for pretibial pitting edema, deep calf tenderness, cyanosis or clubbing.   KPS = ***  100 - Normal; no complaints; no evidence of disease. 90   - Able to carry on normal activity; minor signs or symptoms of disease. 80   - Normal activity with effort; some signs or symptoms of disease. 27   - Cares for self; unable to carry on normal activity or to do active work. 60   - Requires occasional assistance, but is able to care for most of his personal needs. 50   - Requires considerable assistance and frequent medical care. 40   - Disabled; requires special care and assistance. 30   - Severely  disabled; hospital admission is indicated although death not imminent. 20   - Very sick; hospital admission necessary; active supportive treatment necessary. 10   - Moribund; fatal processes progressing rapidly. 0     - Dead  Karnofsky DA, Abelmann WH, Craver LS and Burchenal [facility] [phone]) The use of the nitrogen mustards in the palliative treatment of carcinoma: with particular reference to bronchogenic carcinoma Cancer 1 634-56  LABORATORY DATA:  Lab Results  Component Value Date   WBC 6.8 02/09/2023   HGB 12.0 02/09/2023   HCT 36.2 02/09/2023   MCV 87.7 02/09/2023   PLT 391 02/09/2023   Lab Results  Component Value Date   NA 140 02/09/2023   K 4.3 02/09/2023   CL 105 02/09/2023   CO2 25 02/09/2023   Lab Results  Component Value Date   ALT 24 02/09/2023   AST 39 02/09/2023   ALKPHOS 393 (H) 02/09/2023   BILITOT 0.3 02/09/2023     RADIOGRAPHY: MR Pelvis W Wo Contrast  Addendum Date: 03/08/2023   ADDENDUM REPORT: 03/08/2023 07:58 ADDENDUM: There are multiple enhancing metastatic lesions in the left femoral head and acetabulum, left iliac wing, left femoral neck, left greater trochanter and lesser trochanters. Electronically Signed    By: Larose Hires D.O.   On: 03/08/2023 07:58   Result Date: 03/08/2023 CLINICAL DATA:  Metastatic disease evaluation. EXAM: MRI PELVIS WITHOUT AND WITH CONTRAST TECHNIQUE: Multiplanar multisequence MR imaging of the pelvis was performed both before and after administration of intravenous contrast. CONTRAST:  7mL GADAVIST GADOBUTROL 1 MMOL/ML IV SOLN COMPARISON:  CT examination dated December 29, 2022 FINDINGS: Musculoskeletal: There are numerous T1 hypointense and T2 hyperintense enhancing lesions throughout the pelvic bones and bilateral proximal femurs and visualized lower lumbar spine consistent with diffuse widespread metastatic disease. Status post right hip cephalomedullary nail placement. No marrow edema suggest acute fracture. No appreciable joint effusion. Urinary Tract:  No abnormality visualized. Bowel:  Unremarkable visualized pelvic bowel loops. Vascular/Lymphatic: No pathologically enlarged lymph nodes. No significant vascular abnormality seen. Reproductive:  Uterine fibroid.  No adnexal mass. Other:  None. IMPRESSION: 1. Diffuse widespread metastatic disease throughout the pelvic bones and visualized lower lumbar spine. No pathologic fracture. 2. Status post right hip cephalomedullary nail placement without evidence of periprosthetic fracture. Electronically Signed: By: Larose Hires D.O. On: 02/25/2023 12:20      IMPRESSION/PLAN: 1. 47 y.o. ***    I personally spent *** minutes in this encounter including chart review, reviewing radiological studies, meeting face-to-face with the patient, entering orders and completing documentation.     Joyice Faster, PA-C    Margaretmary Dys, MD  [facility]  Radiation Oncology Direct Dial: [phone]  Fax: [phone] [email or web address]  Skype  [email or web address]   This document serves as a record of services personally performed by Margaretmary Dys, MD and Joyice Faster, PA-C. It was created on their behalf by Mickie Bail, a trained medical scribe. The  creation of this record is based on the scribe's personal observations and the provider's statements to them. This document has been checked and approved by the attending provider.

## 2023-03-21 NOTE — Progress Notes (Signed)
  Radiation Oncology         [phone] ________________________________  Name: LAKAYSHA VICENS MRN: [id number]  Date: 03/22/2023  DOB: [birth year]  SIMULATION AND TREATMENT PLANNING NOTE    ICD-10-CM   1. Malignant neoplasm metastatic to bone (HCC)  C79.51     2. Malignant neoplasm of upper-inner quadrant of left breast in female, estrogen receptor positive (HCC)  C50.212    Z17.0       DIAGNOSIS:  47 yo woman with a left femoral metastasis  NARRATIVE:  The patient was brought to the CT Simulation planning suite.  Identity was confirmed.  All relevant records and images related to the planned course of therapy were reviewed.  The patient freely provided informed written consent to proceed with treatment after reviewing the details related to the planned course of therapy. The consent form was witnessed and verified by the simulation staff.  Then, the patient was set-up in a stable reproducible  supine position for radiation therapy.  CT images were obtained.  Surface markings were placed.  The CT images were loaded into the planning software.  Then the target and avoidance structures were contoured.  Treatment planning then occurred.  The radiation prescription was entered and confirmed.  Then, I designed and supervised the construction of a total of 3 medically necessary complex treatment devices consisting of leg positioner and MLC apertures to cover the treated hip area.  I have requested : 3D Simulation  I have requested a DVH of the following structures: Rectum, Bladder, femoral heads and target.  SPECIAL TREATMENT PROCEDURE:  The planned course of therapy using radiation constitutes a special treatment procedure. Special care is required in the management of this patient for the following reasons. This treatment constitutes a Special Treatment Procedure for the following reason: [ Retreatment in a previously radiated area requiring careful monitoring of increased risk of toxicity due to  overlap of previous treatment.  Previous pelvic radiation to right hip.  Dose accumulation requested.  The special nature of the planned course of radiotherapy will require increased physician supervision and oversight to ensure patient's safety with optimal treatment outcomes.  This will require extended time and effort from me.   PLAN:  The patient will receive 30 Gy in 10 fractions.  ________________________________  Artist Pais Kathrynn Running, M.D.

## 2023-03-22 ENCOUNTER — Encounter: Payer: Self-pay | Admitting: Radiation Oncology

## 2023-03-22 ENCOUNTER — Ambulatory Visit
Admission: RE | Admit: 2023-03-22 | Discharge: 2023-03-22 | Disposition: A | Discharge status: Home / Self Care | Payer: 59 | Source: Ambulatory Visit | Attending: Radiation Oncology | Admitting: Radiation Oncology

## 2023-03-22 ENCOUNTER — Other Ambulatory Visit: Payer: Self-pay

## 2023-03-22 VITALS — BP 111/89 | HR 92 | Temp 97.7°F | Resp 18 | Ht 65.0 in | Wt 154.2 lb

## 2023-03-22 DIAGNOSIS — Z923 Personal history of irradiation: Secondary | ICD-10-CM | POA: Insufficient documentation

## 2023-03-22 DIAGNOSIS — Z87891 Personal history of nicotine dependence: Secondary | ICD-10-CM | POA: Insufficient documentation

## 2023-03-22 DIAGNOSIS — G893 Neoplasm related pain (acute) (chronic): Secondary | ICD-10-CM | POA: Diagnosis not present

## 2023-03-22 DIAGNOSIS — D649 Anemia, unspecified: Secondary | ICD-10-CM | POA: Insufficient documentation

## 2023-03-22 DIAGNOSIS — C50212 Malignant neoplasm of upper-inner quadrant of left female breast: Secondary | ICD-10-CM | POA: Insufficient documentation

## 2023-03-22 DIAGNOSIS — Z87442 Personal history of urinary calculi: Secondary | ICD-10-CM | POA: Insufficient documentation

## 2023-03-22 DIAGNOSIS — C50512 Malignant neoplasm of lower-outer quadrant of left female breast: Secondary | ICD-10-CM | POA: Diagnosis not present

## 2023-03-22 DIAGNOSIS — Z510 Encounter for antineoplastic radiation therapy: Secondary | ICD-10-CM | POA: Insufficient documentation

## 2023-03-22 DIAGNOSIS — Z170 Estrogen receptor positive status [ER+]: Secondary | ICD-10-CM | POA: Insufficient documentation

## 2023-03-22 DIAGNOSIS — R200 Anesthesia of skin: Secondary | ICD-10-CM | POA: Insufficient documentation

## 2023-03-22 DIAGNOSIS — C7951 Secondary malignant neoplasm of bone: Secondary | ICD-10-CM | POA: Insufficient documentation

## 2023-03-22 DIAGNOSIS — Z79811 Long term (current) use of aromatase inhibitors: Secondary | ICD-10-CM | POA: Insufficient documentation

## 2023-03-22 NOTE — Progress Notes (Addendum)
Histology and Location of Primary Cancer: Malignant neoplasm of upper left breast female, estrogen receptor positive  Sites of Visceral and Bony Metastatic Disease: Left hip/femur  Location(s) of Symptomatic Metastases: Left hip/femur   02/23/2023 Dr. Mikey College MR Pelvis with/without Contrast CLINICAL DATA: Metastatic disease evaluation   ADDENDUM REPORT: 03/08/2023 07:58 ADDENDUM: There are multiple enhancing metastatic lesions in the left femoral head and acetabulum, left iliac wing, left femoral neck, left greater trochanter and lesser trochanters.   IMPRESSION: 1. Diffuse widespread metastatic disease throughout the pelvic bones and visualized lower lumbar spine. No pathologic fracture. 2. Status post right hip cephalomedullary nail placement without evidence of periprosthetic fracture.   12/29/2022 Dr. Silverio Lay CT Chest Abdomen Pelvis with Contrast CLINICAL DATA:  History of metastatic breast cancer presenting with back pain.  IMPRESSION: 1. Acute compression fracture deformity of the T10 vertebral body. MRI correlation is recommended. 2. Innumerable lytic and sclerotic lesions throughout the visualized osseous structures, consistent with osseous metastasis. 3. Stable right breast soft tissue mass, as described above. Correlation with biopsy results is recommended. 4. Hepatic steatosis. 5. 2 mm nonobstructing left renal calculus. 6. Small hiatal hernia. 7. Prior open reduction and internal fixation of the proximal right femur.   12/29/2022 Dr. Silverio Lay MR Thoracic Spine with/without Contrast CLINICAL DATA: Initial evaluation for thoracic osteomyelitis.   IMPRESSION: 1. Widespread osseous metastatic disease throughout the thoracic spine. Pathologic compression fractures involving the T4, T5, T10, and T12 vertebral bodies. Mildly progressive height loss seen about the T10 fracture as compared to previous MRI from 02/03/2022. 2. Abnormal paraspinous edema and enhancement along the  anterior margin of the spinal column at T9 through T11-12. Finding is nonspecific, and could reflect extra osseous extension of tumor. Superimposed infection with osteomyelitis and paraspinous phlegmonous change difficult to exclude, and could be considered in the correct clinical setting. No loculated collections. 3. Mild smooth epidural enhancement extending from T9 through T11, possibly reactive. No other epidural involvement of tumor. 4. Trace layering bilateral pleural effusions.   12/29/2022 Dr. Silverio Lay MR Lumbar Spine with/without Contrast CLINICAL DATA:  Initial evaluation for back pain, history of metastatic disease. Evaluate for infection.  IMPRESSION: 1. Widespread osseous metastatic disease throughout the lumbar spine and visualized sacrum/pelvis. No significant extra osseous or epidural extension of tumor at this time. No MRI evidence for superimposed infection. 2. Associated pathologic compression fractures involving the superior endplates of T12, L2, and L4. 3. Mild edema within the lower posterior paraspinous musculature, nonspecific, but could reflect muscular injury and/or strain. 4. Underlying mild for age spondylosis   Past/Anticipated chemotherapy by medical oncology, if any:   Dr. Serena Croissant    Pain on a scale of 0-10 is:  Between shoulder blades 7/10 pain scale and left hip neck  9/10 pain scale.  Did take pain medication Oxycodone this morning.   If Spine Met(s), symptoms, if any, include: Bowel/Bladder retention or incontinence (please describe): No Numbness or weakness in extremities (please describe): Left hand the first 4 digit of fingers.  Started 4 days ago. Current Decadron regimen, if applicable: No  Ambulatory status? Walker? Wheelchair?: Independent  SAFETY ISSUES: Prior radiation? Yes, iliac bone and hip (right) 04/2021-05/2021 and  cervical and thoracic spine.  Right leg has a metal pin. Pacemaker/ICD? No Possible current pregnancy? No ovaries  removed in September 30, 2022. Is the patient on methotrexate? No  Current Complaints / other details:

## 2023-03-23 ENCOUNTER — Other Ambulatory Visit: Payer: Self-pay

## 2023-03-23 ENCOUNTER — Encounter: Payer: Self-pay | Admitting: Hematology and Oncology

## 2023-03-23 ENCOUNTER — Ambulatory Visit
Admission: RE | Admit: 2023-03-23 | Discharge: 2023-03-23 | Disposition: A | Discharge status: Home / Self Care | Payer: 59 | Source: Ambulatory Visit | Attending: Radiation Oncology | Admitting: Radiation Oncology

## 2023-03-23 DIAGNOSIS — C50512 Malignant neoplasm of lower-outer quadrant of left female breast: Secondary | ICD-10-CM | POA: Diagnosis not present

## 2023-03-23 DIAGNOSIS — C7951 Secondary malignant neoplasm of bone: Secondary | ICD-10-CM | POA: Diagnosis not present

## 2023-03-23 LAB — RAD ONC ARIA SESSION SUMMARY
Course Elapsed Days: 0
Plan Fractions Treated to Date: 1
Plan Prescribed Dose Per Fraction: 3 Gy
Plan Total Fractions Prescribed: 10
Plan Total Prescribed Dose: 30 Gy
Reference Point Dosage Given to Date: 3 Gy
Reference Point Session Dosage Given: 3 Gy
Session Number: 1

## 2023-03-24 ENCOUNTER — Ambulatory Visit
Admission: RE | Admit: 2023-03-24 | Discharge: 2023-03-24 | Disposition: A | Discharge status: Home / Self Care | Payer: 59 | Source: Ambulatory Visit | Attending: Radiation Oncology | Admitting: Radiation Oncology

## 2023-03-24 ENCOUNTER — Other Ambulatory Visit: Payer: Self-pay

## 2023-03-24 ENCOUNTER — Encounter (HOSPITAL_COMMUNITY): Payer: Self-pay

## 2023-03-24 ENCOUNTER — Inpatient Hospital Stay (HOSPITAL_COMMUNITY)
Admission: EM | Admit: 2023-03-24 | Discharge: 2023-03-27 | DRG: 948 | Disposition: A | Discharge status: Home / Self Care | Payer: 59 | Attending: Internal Medicine | Admitting: Internal Medicine

## 2023-03-24 ENCOUNTER — Emergency Department (HOSPITAL_COMMUNITY): Payer: 59

## 2023-03-24 DIAGNOSIS — G893 Neoplasm related pain (acute) (chronic): Principal | ICD-10-CM | POA: Diagnosis present

## 2023-03-24 DIAGNOSIS — G96198 Other disorders of meninges, not elsewhere classified: Secondary | ICD-10-CM | POA: Diagnosis not present

## 2023-03-24 DIAGNOSIS — K5900 Constipation, unspecified: Secondary | ICD-10-CM | POA: Diagnosis not present

## 2023-03-24 DIAGNOSIS — M47812 Spondylosis without myelopathy or radiculopathy, cervical region: Secondary | ICD-10-CM | POA: Diagnosis not present

## 2023-03-24 DIAGNOSIS — Z1152 Encounter for screening for COVID-19: Secondary | ICD-10-CM | POA: Diagnosis not present

## 2023-03-24 DIAGNOSIS — Z87442 Personal history of urinary calculi: Secondary | ICD-10-CM

## 2023-03-24 DIAGNOSIS — M545 Low back pain, unspecified: Secondary | ICD-10-CM | POA: Diagnosis not present

## 2023-03-24 DIAGNOSIS — Z91048 Other nonmedicinal substance allergy status: Secondary | ICD-10-CM | POA: Diagnosis not present

## 2023-03-24 DIAGNOSIS — Z5941 Food insecurity: Secondary | ICD-10-CM

## 2023-03-24 DIAGNOSIS — Z885 Allergy status to narcotic agent status: Secondary | ICD-10-CM | POA: Diagnosis not present

## 2023-03-24 DIAGNOSIS — R202 Paresthesia of skin: Secondary | ICD-10-CM | POA: Diagnosis not present

## 2023-03-24 DIAGNOSIS — Z87891 Personal history of nicotine dependence: Secondary | ICD-10-CM | POA: Diagnosis not present

## 2023-03-24 DIAGNOSIS — M8458XA Pathological fracture in neoplastic disease, other specified site, initial encounter for fracture: Secondary | ICD-10-CM | POA: Diagnosis not present

## 2023-03-24 DIAGNOSIS — Z79811 Long term (current) use of aromatase inhibitors: Secondary | ICD-10-CM

## 2023-03-24 DIAGNOSIS — M4854XA Collapsed vertebra, not elsewhere classified, thoracic region, initial encounter for fracture: Secondary | ICD-10-CM | POA: Diagnosis not present

## 2023-03-24 DIAGNOSIS — Z888 Allergy status to other drugs, medicaments and biological substances status: Secondary | ICD-10-CM | POA: Diagnosis not present

## 2023-03-24 DIAGNOSIS — F419 Anxiety disorder, unspecified: Secondary | ICD-10-CM | POA: Diagnosis present

## 2023-03-24 DIAGNOSIS — Z170 Estrogen receptor positive status [ER+]: Secondary | ICD-10-CM

## 2023-03-24 DIAGNOSIS — M48061 Spinal stenosis, lumbar region without neurogenic claudication: Secondary | ICD-10-CM | POA: Diagnosis not present

## 2023-03-24 DIAGNOSIS — Z887 Allergy status to serum and vaccine status: Secondary | ICD-10-CM | POA: Diagnosis not present

## 2023-03-24 DIAGNOSIS — Z66 Do not resuscitate: Secondary | ICD-10-CM | POA: Diagnosis not present

## 2023-03-24 DIAGNOSIS — Z923 Personal history of irradiation: Secondary | ICD-10-CM | POA: Diagnosis not present

## 2023-03-24 DIAGNOSIS — R109 Unspecified abdominal pain: Secondary | ICD-10-CM | POA: Diagnosis not present

## 2023-03-24 DIAGNOSIS — N200 Calculus of kidney: Secondary | ICD-10-CM | POA: Diagnosis present

## 2023-03-24 DIAGNOSIS — C801 Malignant (primary) neoplasm, unspecified: Secondary | ICD-10-CM | POA: Diagnosis not present

## 2023-03-24 DIAGNOSIS — C50212 Malignant neoplasm of upper-inner quadrant of left female breast: Secondary | ICD-10-CM

## 2023-03-24 DIAGNOSIS — C7951 Secondary malignant neoplasm of bone: Secondary | ICD-10-CM | POA: Diagnosis present

## 2023-03-24 DIAGNOSIS — C50919 Malignant neoplasm of unspecified site of unspecified female breast: Secondary | ICD-10-CM | POA: Diagnosis not present

## 2023-03-24 DIAGNOSIS — Z515 Encounter for palliative care: Secondary | ICD-10-CM

## 2023-03-24 DIAGNOSIS — Z79899 Other long term (current) drug therapy: Secondary | ICD-10-CM

## 2023-03-24 DIAGNOSIS — Z79810 Long term (current) use of selective estrogen receptor modulators (SERMs): Secondary | ICD-10-CM

## 2023-03-24 DIAGNOSIS — M50223 Other cervical disc displacement at C6-C7 level: Secondary | ICD-10-CM | POA: Diagnosis not present

## 2023-03-24 DIAGNOSIS — C50512 Malignant neoplasm of lower-outer quadrant of left female breast: Secondary | ICD-10-CM | POA: Diagnosis not present

## 2023-03-24 DIAGNOSIS — M84451A Pathological fracture, right femur, initial encounter for fracture: Secondary | ICD-10-CM | POA: Diagnosis present

## 2023-03-24 DIAGNOSIS — Z510 Encounter for antineoplastic radiation therapy: Secondary | ICD-10-CM | POA: Diagnosis not present

## 2023-03-24 LAB — RAD ONC ARIA SESSION SUMMARY
Course Elapsed Days: 1
Plan Fractions Treated to Date: 2
Plan Prescribed Dose Per Fraction: 3 Gy
Plan Total Fractions Prescribed: 10
Plan Total Prescribed Dose: 30 Gy
Reference Point Dosage Given to Date: 6 Gy
Reference Point Session Dosage Given: 3 Gy
Session Number: 2

## 2023-03-24 LAB — BASIC METABOLIC PANEL
Anion gap: 14 (ref 5–15)
BUN: 10 mg/dL (ref 6–20)
CO2: 23 mmol/L (ref 22–32)
Calcium: 10.4 mg/dL — ABNORMAL HIGH (ref 8.9–10.3)
Chloride: 100 mmol/L (ref 98–111)
Creatinine, Ser: 0.77 mg/dL (ref 0.44–1.00)
GFR, Estimated: >60 mL/min (ref >60)
Glucose, Bld: 113 mg/dL — ABNORMAL HIGH (ref 70–99)
Potassium: 3.9 mmol/L (ref 3.5–5.1)
Sodium: 137 mmol/L (ref 135–145)

## 2023-03-24 LAB — CBC WITH DIFFERENTIAL/PLATELET
Abs Immature Granulocytes: 0.05 10*3/uL (ref 0.00–0.07)
Basophils Absolute: 0.1 10*3/uL (ref 0.0–0.1)
Basophils Relative: 1 %
Eosinophils Absolute: 0.5 10*3/uL (ref 0.0–0.5)
Eosinophils Relative: 4 %
HCT: 39 % (ref 36.0–46.0)
Hemoglobin: 12.2 g/dL (ref 12.0–15.0)
Immature Granulocytes: 1 %
Lymphocytes Relative: 18 %
Lymphs Abs: 2 10*3/uL (ref 0.7–4.0)
MCH: 27.7 pg (ref 26.0–34.0)
MCHC: 31.3 g/dL (ref 30.0–36.0)
MCV: 88.4 fL (ref 80.0–100.0)
Monocytes Absolute: 1 10*3/uL (ref 0.1–1.0)
Monocytes Relative: 9 %
Neutro Abs: 7.4 10*3/uL (ref 1.7–7.7)
Neutrophils Relative %: 67 %
Platelets: 391 10*3/uL (ref 150–400)
RBC: 4.41 MIL/uL (ref 3.87–5.11)
RDW: 15.1 % (ref 11.5–15.5)
WBC: 11 10*3/uL — ABNORMAL HIGH (ref 4.0–10.5)
nRBC: 0 % (ref 0.0–0.2)

## 2023-03-24 LAB — URINALYSIS, ROUTINE W REFLEX MICROSCOPIC
Bilirubin Urine: NEGATIVE
Glucose, UA: NEGATIVE mg/dL
Hgb urine dipstick: NEGATIVE
Ketones, ur: NEGATIVE mg/dL
Leukocytes,Ua: NEGATIVE
Nitrite: NEGATIVE
Protein, ur: NEGATIVE mg/dL
Specific Gravity, Urine: 1.018 (ref 1.005–1.030)
pH: 5 (ref 5.0–8.0)

## 2023-03-24 LAB — HEPATIC FUNCTION PANEL
ALT: 36 U/L (ref 0–44)
AST: 65 U/L — ABNORMAL HIGH (ref 15–41)
Albumin: 4.4 g/dL (ref 3.5–5.0)
Alkaline Phosphatase: 398 U/L — ABNORMAL HIGH (ref 38–126)
Bilirubin, Direct: <0.1 mg/dL (ref 0.0–0.2)
Total Bilirubin: 0.6 mg/dL (ref 0.3–1.2)
Total Protein: 8.5 g/dL — ABNORMAL HIGH (ref 6.5–8.1)

## 2023-03-24 LAB — MAGNESIUM: Magnesium: 2.1 mg/dL (ref 1.7–2.4)

## 2023-03-24 LAB — PHOSPHORUS: Phosphorus: 5.5 mg/dL — ABNORMAL HIGH (ref 2.5–4.6)

## 2023-03-24 LAB — CK: Total CK: 48 U/L (ref 38–234)

## 2023-03-24 LAB — PREGNANCY, URINE: Preg Test, Ur: NEGATIVE

## 2023-03-24 LAB — SEDIMENTATION RATE: Sed Rate: 56 mm/hr — ABNORMAL HIGH (ref 0–22)

## 2023-03-24 MED ORDER — ACETAMINOPHEN 325 MG PO TABS
650.0000 mg | ORAL_TABLET | Freq: Four times a day (QID) | ORAL | Status: DC | PRN
  Administered 2023-03-25: 650 mg via ORAL
  Filled 2023-03-24: qty 2

## 2023-03-24 MED ORDER — OXYCODONE-ACETAMINOPHEN 5-325 MG PO TABS
1.0000 | ORAL_TABLET | Freq: Three times a day (TID) | ORAL | Status: DC | PRN
  Administered 2023-03-25 – 2023-03-26 (×4): 1 via ORAL
  Filled 2023-03-24 (×4): qty 1

## 2023-03-24 MED ORDER — HYDROMORPHONE HCL 1 MG/ML IJ SOLN
1.0000 mg | Freq: Once | INTRAMUSCULAR | Status: AC
  Administered 2023-03-24: 1 mg via INTRAVENOUS
  Filled 2023-03-24: qty 1

## 2023-03-24 MED ORDER — ONDANSETRON HCL 4 MG PO TABS: 4.0000 mg | ORAL_TABLET | Freq: Four times a day (QID) | ORAL | Status: DC | PRN

## 2023-03-24 MED ORDER — HYDROMORPHONE HCL 1 MG/ML IJ SOLN
0.5000 mg | INTRAMUSCULAR | Status: DC | PRN
  Administered 2023-03-24 – 2023-03-27 (×9): 1 mg via INTRAVENOUS
  Filled 2023-03-24 (×9): qty 1

## 2023-03-24 MED ORDER — GADOBUTROL 1 MMOL/ML IV SOLN
7.0000 mL | Freq: Once | INTRAVENOUS | Status: AC | PRN
  Administered 2023-03-24: 7 mL via INTRAVENOUS

## 2023-03-24 MED ORDER — ONDANSETRON HCL 4 MG/2ML IJ SOLN
4.0000 mg | Freq: Once | INTRAMUSCULAR | Status: AC
  Administered 2023-03-24: 4 mg via INTRAVENOUS
  Filled 2023-03-24: qty 2

## 2023-03-24 MED ORDER — SODIUM CHLORIDE 0.9 % IV SOLN: INTRAVENOUS | Status: AC

## 2023-03-24 MED ORDER — ACETAMINOPHEN 650 MG RE SUPP: 650.0000 mg | Freq: Four times a day (QID) | RECTAL | Status: DC | PRN

## 2023-03-24 MED ORDER — DOCUSATE SODIUM 100 MG PO CAPS
100.0000 mg | ORAL_CAPSULE | Freq: Two times a day (BID) | ORAL | Status: DC
  Administered 2023-03-24 – 2023-03-26 (×4): 100 mg via ORAL
  Filled 2023-03-24 (×5): qty 1

## 2023-03-24 MED ORDER — SODIUM CHLORIDE 0.9 % IV BOLUS
500.0000 mL | Freq: Once | INTRAVENOUS | Status: AC
  Administered 2023-03-24: 500 mL via INTRAVENOUS

## 2023-03-24 MED ORDER — OXYCODONE-ACETAMINOPHEN 10-325 MG PO TABS: 1.0000 | ORAL_TABLET | Freq: Three times a day (TID) | ORAL | Status: DC | PRN

## 2023-03-24 MED ORDER — METHOCARBAMOL 1000 MG/10ML IJ SOLN
500.0000 mg | Freq: Four times a day (QID) | INTRAVENOUS | Status: DC | PRN
  Administered 2023-03-24 – 2023-03-26 (×4): 500 mg via INTRAVENOUS
  Filled 2023-03-24: qty 500
  Filled 2023-03-24: qty 5
  Filled 2023-03-24 (×2): qty 500

## 2023-03-24 MED ORDER — OXYCODONE HCL 5 MG PO TABS
5.0000 mg | ORAL_TABLET | Freq: Three times a day (TID) | ORAL | Status: DC | PRN
  Administered 2023-03-25 – 2023-03-26 (×4): 5 mg via ORAL
  Filled 2023-03-24 (×4): qty 1

## 2023-03-24 MED ORDER — MORPHINE SULFATE ER 15 MG PO TBCR
30.0000 mg | EXTENDED_RELEASE_TABLET | Freq: Two times a day (BID) | ORAL | Status: DC
  Administered 2023-03-24 – 2023-03-25 (×2): 30 mg via ORAL
  Filled 2023-03-24 (×2): qty 2

## 2023-03-24 MED ORDER — ONDANSETRON HCL 4 MG/2ML IJ SOLN
4.0000 mg | Freq: Four times a day (QID) | INTRAMUSCULAR | Status: DC | PRN
  Administered 2023-03-24 – 2023-03-27 (×2): 4 mg via INTRAVENOUS
  Filled 2023-03-24 (×2): qty 2

## 2023-03-24 NOTE — Assessment & Plan Note (Signed)
Followed by Dr. Pamelia Hoit will appreciate oncology consult

## 2023-03-24 NOTE — Assessment & Plan Note (Signed)
Currently undergoing palliative radiation therapy

## 2023-03-24 NOTE — Subjective & Objective (Signed)
Presents with back and flank spasms that have started 2 days ago also some tinging in the fingers Back pain worse on the left  Currently undergoing palliative radiation therapy to her hip send admitted to pathologic fracture secondary to breast cancer metastases to the bone She has known history of metastatic breast cancer to the spine and pelvis. Denies any urinary or bladder incontinence.  CT scan showed L3-L4 epidural mass In April she had possible osteomyelitis No fevers or chills no nausea no vomiting no trouble with ambulation Patient at home takes Arimidex and MS Contin 30 mg every 12 hours as well as.  Percocets 10/325 every 8 hours

## 2023-03-24 NOTE — ED Provider Notes (Signed)
4:47 PM Assumed care of patient from off-going team. For more details, please see note from same day.  In brief, this is a 47 y.o. female with metastatic breast cancer to spine/pelvis, on chronic high dose oxycodone. Works as Set designer. Presents w/ L flank pain. H/o L nephrolithiasis. No midline pain, no LE neuro symptoms. Urine clean, blood work okay. On CT, possible new epidural mass at L3-L4. Admitted in April w/ possible osteomyelitis. Made aware of possible findings and states she does not want surgery but would still like to move forward with MRI. Not on hospice.   Plan/Dispo at time of sign-out & ED Course since sign-out: [ ]  MRI  BP 113/78   Pulse 64   Temp 98.8 F (37.1 C) (Oral)   Resp 18   Ht 5\' 5"  (1.651 m)   Wt 69 kg   SpO2 95%   BMI 25.31 kg/m    ED Course:      Dispo:  ------------------------------- Vivi Barrack, MD Emergency Medicine  This note was created using dictation software, which may contain spelling or grammatical errors.

## 2023-03-24 NOTE — H&P (Signed)
Natalie Griffin VHQ:[id number] DOB: Oct 14, 1975 DOA: 03/24/2023     PCP: Charlane Ferretti, DO   Outpatient Specialists:      Oncology  Dr. Rip Harbour onc Kathrynn Running  Patient arrived to ER on 03/24/23 at 1143 Referred by Attending Loetta Rough, MD   Patient coming from:    home Lives alone,      Chief Complaint:   Chief Complaint  Patient presents with   Back Pain    Spasm in lumbar area    HPI: Natalie Griffin is a 47 y.o. female with medical history significant of breat cancer with mets to the bone, anemia, anxiety, hiatal hernia    Presented with  back pain  Presents with back and flank spasms that have started 2 days ago also some tinging in the fingers Back pain worse on the left  Currently undergoing palliative radiation therapy to her hip send admitted to pathologic fracture secondary to breast cancer metastases to the bone She has known history of metastatic breast cancer to the spine and pelvis. Denies any urinary or bladder incontinence.  CT scan showed L3-L4 epidural mass In April she had possible osteomyelitis No fevers or chills no nausea no vomiting no trouble with ambulation Patient at home takes Arimidex and MS Contin 30 mg every 12 hours as well as.  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 Pamelia Hoit. This  was switched to Herceptin Hylecta on 06/11/21 due to side effects from Phresgo. Despite this, she developed compression fractures in thoracic spine in 01/2022. She was also found to have a contralateral recurrence in the right breast on 07/21/22. She had also developed pain to her sternum and upper spine, and she was thus referred back to Dr. Mitzi Hansen on 07/28/22. She completed palliative radiation to the sternum and upper thoracic spine. Since that time, she discontinued Herceptin after 09/17/22 dose due to her insurance no longer covering Hylecta and patient declining IV Kadcyla. She also underwent oophorectomy on 09/30/22 under Dr. Pricilla Holm and subsequently switched to anastrozole.   t restaging pelvis MRI on 02/23/23 showing: multiple enhancing metastatic lesions in left femoral head, acetabulum, neck, and greater and lesser trochanters and left iliac wing. Due to worsening pain in her left hip, she was referred back to radiation therapy   - last echo normal EF Recent Results (from the past 41324 hour(s))  ECHOCARDIOGRAM COMPLETE   Collection Time: 10/23/22 11:00 AM  Result Value   S' Lateral 2.50   Area-P 1/2 1.60   Est EF 50 - 55%   Narrative      ECHOCARDIOGRAM REPORT       IMPRESSIONS    1. Left ventricular ejection fraction, by estimation, is 50 to 55%. The left ventricle has low normal function. Left ventricular endocardial border not optimally defined to evaluate  regional wall motion. Left ventricular diastolic parameters were  normal.  2. Right ventricular systolic function is mildly reduced. The right ventricular size is normal. Tricuspid regurgitation signal is inadequate for assessing PA pressure.  3. The mitral valve is grossly normal. No evidence of mitral valve regurgitation. No evidence of mitral stenosis.  4. The aortic valve is tricuspid. Aortic valve regurgitation is not visualized. No aortic stenosis is present.  Comparison(s): Prior images reviewed side by side. EF may be  slightly less than prior exam. Grossly normal diastolic function by tissue Doppler. Strain suboptimal quality, not reported.           Chronic anemia - baseline hg Hemoglobin & Hematocrit  Recent Labs    12/31/22 0804 02/09/23 1448 03/24/23 1349  HGB 11.5* 12.0 12.2   Iron/TIBC/Ferritin/ %Sat    Component Value Date/Time   IRON 76 06/03/2021 1437   TIBC 357 06/03/2021 1437   FERRITIN 25 06/03/2021 1437   IRONPCTSAT 21 06/03/2021 1437      While in ER: Clinical Course as of 03/24/23 2059  Wed Mar 24, 2023  1902 Called and discussed with radiologist. Does not think that it is encroaching on her nerve roots. States that if we needed, we could repeat contrast for thoracic MRI to completely image  [HN]  2022 Pt reevaluated. States pain is severe and returning. D/w with patient as well, she states she has had tingling in her 2-5th digits of her left hand for the last four days. She denies any other neurologic symptoms. She states she is concerned about the thoracic mass. I believe she should have her cervical and thoracic spine imaged as well. Will admit patient for pain control, MRI C and T spine in the AM. After discussion with radiologist, do not believe she needs to be emergently transferred to [facility] ED tonight for MRI imaging. Patient amenable to plan.  [HN]    Clinical Course User Index [HN] Loetta Rough, MD       Lab Orders         CBC with Differential         Basic metabolic panel         Urinalysis, Routine w reflex microscopic -Urine, Clean Catch         Pregnancy, urine       CTabd/pelvis -  Marked progression of lytic and sclerotic metastatic disease throughout the spine, sacrum and pelvis. Minor endplate deformities as seen previously without interval frank compression fracture, though the patient would certainly be at risk of such. See results of lumbar spine CT. 2. 2 mm nonobstructing stone in the midportion of the left kidney. No hydroureteronephrosis or  passing stone. 3. Mild diffuse fatty change of the liver.    MRi Lumbar  Narrative & Impression     1. Redemonstrated widespread osseous metastatic disease, with unchanged pathologic compression fractures at T12 and L4 and increased height loss at L2, now up to 35%, previously 20%. 2. Multilevel epidural extension of tumor. Epidural extension at T11 is incompletely evaluated but appears increased from the prior exam. 3. Narrowing of the left greater than right lateral recess posterior to the L4 vertebral body, secondary to epidural extension, which could affect the descending L4 nerve roots. 4. L4-L5 mild right neural foraminal narrowing, secondary to osseous metastatic disease in the right pedicle of L4. 5. Possible impingement of the right extraforaminal L5 nerve secondary to osseous metastatic hypertrophy.    Following Medications were ordered in ER: Medications  sodium chloride 0.9 % bolus 500 mL (0 mLs Intravenous Stopped 03/24/23 1425)  HYDROmorphone (DILAUDID) injection 1 mg (1 mg Intravenous Given 03/24/23 1351)  ondansetron (ZOFRAN) injection 4 mg (4 mg Intravenous Given 03/24/23 1521)  HYDROmorphone (DILAUDID) injection 1 mg (1 mg Intravenous Given 03/24/23 1553)  gadobutrol (GADAVIST) 1 MMOL/ML injection 7 mL (7 mLs Intravenous Contrast Given 03/24/23 1737)  HYDROmorphone (DILAUDID) injection 1 mg (1 mg Intravenous Given 03/24/23 2038)    _______________________________________________________ ER Provider Called:   Oncology  Dr.Dorsey They Recommend admit to medicine   Will see in AM    ED Triage Vitals  Enc Vitals Group     BP 03/24/23 1149 119/71     Pulse Rate 03/24/23 1149 85     Resp 03/24/23 1149 17     Temp 03/24/23 1149 98.2 F (36.8 C)     Temp Source 03/24/23 1149 Oral     SpO2 03/24/23 1149 100 %     Weight 03/24/23 1157 152 lb 1.9 oz (69 kg)     Height 03/24/23 1157 5\' 5"  (1.651 m)     Head Circumference --      Peak Flow --      Pain Score 03/24/23 1157 10      Pain Loc --      Pain Edu? --      Excl. in GC? --   TMAX(24)@     _________________________________________ Significant initial  Findings: Abnormal Labs Reviewed  CBC WITH DIFFERENTIAL/PLATELET - Abnormal; Notable for the following components:      Result Value   WBC 11.0 (*)    All other components within normal limits  BASIC METABOLIC PANEL - Abnormal; Notable for the following components:   Glucose, Bld 113 (*)    Calcium 10.4 (*)    All other components within normal limits  URINALYSIS, ROUTINE W REFLEX MICROSCOPIC - Abnormal; Notable for the following components:   APPearance HAZY (*)    All other components within normal limits       Cardiac Panel (last 3 results) No results for input(s): "CKTOTAL", "CKMB", "TROPONINIHS", "RELINDX" in the last 72 hours.   ECG: Ordered    The recent clinical data is shown below. Vitals:   03/24/23 1541 03/24/23 1700 03/24/23 1900 03/24/23 2003  BP:  102/70  129/66  Pulse:  79  63  Resp:  18  18  Temp: 98.8 F (37.1 C)  98.5 F (36.9 C)   TempSrc: Oral  Oral   SpO2:  96%  98%  Weight:      Height:         WBC     Component Value Date/Time   WBC 11.0 (H) 03/24/2023 1349   LYMPHSABS 2.0 03/24/2023 1349   MONOABS 1.0 03/24/2023 1349   EOSABS 0.5 03/24/2023 1349   BASOSABS 0.1 03/24/2023 1349    Lactic Acid, Venous    Component Value Date/Time   LATICACIDVEN 1.3 12/29/2022 1955      UA   no evidence of UTI     Urine analysis:    Component Value Date/Time   COLORURINE YELLOW 03/24/2023 1359   APPEARANCEUR HAZY (A) 03/24/2023 1359   LABSPEC 1.018 03/24/2023 1359   PHURINE 5.0 03/24/2023 1359   GLUCOSEU NEGATIVE 03/24/2023 1359   HGBUR NEGATIVE 03/24/2023 1359   BILIRUBINUR NEGATIVE 03/24/2023 1359   KETONESUR NEGATIVE 03/24/2023 1359   PROTEINUR NEGATIVE 03/24/2023 1359   NITRITE NEGATIVE 03/24/2023 1359   LEUKOCYTESUR NEGATIVE 03/24/2023 1359  Results for orders placed or performed during the hospital  encounter of 12/29/22  Blood culture (routine x 2)     Status: None   Collection Time: 12/29/22  6:10 PM   Specimen: BLOOD  Result Value Ref Range Status   Specimen Description   Final    BLOOD BLOOD RIGHT FOREARM Performed at [facility], [address], [place], [place] [address]    Special Requests   Final    BOTTLES DRAWN AEROBIC AND ANAEROBIC Blood Culture results may not be optimal due to an excessive volume of blood received in culture bottles Performed at [facility], [address] [place], [place] [address]    Culture   Final    NO GROWTH 5 DAYS Performed at [facility], [address] [place], [place] [address]    Report Status 01/03/2023 FINAL  Final  Blood culture (routine x 2)     Status: None   Collection Time: 12/29/22  7:55 PM   Specimen: BLOOD LEFT HAND  Result Value Ref Range Status   Specimen Description   Final    BLOOD LEFT HAND Performed at [facility], [address], [place], [place] [address]    Special Requests   Final    BOTTLES DRAWN AEROBIC AND ANAEROBIC Blood Culture results may not be optimal due to an excessive volume of blood received in culture bottles Performed at [facility], [address], [place], [place] [address]    Culture   Final    NO GROWTH 5 DAYS Performed at [facility], [address], [place], [place] [address]    Report Status 01/03/2023 FINAL  Final    ABX started Antibiotics Given (last 72 hours)     None       No results found for the last 90 days.  ______________________________________________ Recent Labs  Lab 03/24/23 1349  NA 137  K 3.9  CO2 23  GLUCOSE 113*  BUN 10  CREATININE 0.77  CALCIUM 10.4*    Cr   stable,    Lab Results  Component Value Date   CREATININE 0.77 03/24/2023   CREATININE 0.79 02/09/2023   CREATININE 0.81 12/31/2022    Lab Results  Component Value Date   CALCIUM 10.4 (H) 03/24/2023     Plt: Lab Results  Component Value Date   PLT 391 03/24/2023       Recent Labs  Lab 03/24/23 1349  WBC 11.0*  NEUTROABS 7.4  HGB 12.2  HCT 39.0  MCV 88.4  PLT 391    HG/HCT  stable,       Component Value Date/Time   HGB 12.2 03/24/2023 1349   HGB 12.0 02/09/2023 1448   HCT 39.0 03/24/2023 1349   MCV 88.4 03/24/2023 1349      _______________________________________________ Hospitalist was called for admission for   cancer related pain  Mass in epidural space    Tingling of left upper extremity      The following Work up has been ordered so far:  Orders Placed This Encounter  Procedures   CT Renal Stone Study   CT L-SPINE NO CHARGE   MR Lumbar Spine W Wo Contrast   MR Cervical Spine W and Wo Contrast   MR THORACIC SPINE W WO CONTRAST   CBC with Differential   Basic metabolic panel   Urinalysis, Routine w reflex microscopic -Urine, Clean Catch   Pregnancy, urine   Consult to hospitalist  Consult to oncology     OTHER Significant initial  Findings:  labs showing:     DM  labs:  HbA1C: No results for input(s): "HGBA1C" in the last 8760 hours.     CBG (last 3)  No results for input(s): "GLUCAP" in the last 72 hours.        Cultures:    Component Value Date/Time   SDES  12/29/2022 1955    BLOOD LEFT HAND Performed at [facility], [address], [place], [place] [address]    SPECREQUEST  12/29/2022 1955    BOTTLES DRAWN AEROBIC AND ANAEROBIC Blood Culture results may not be optimal due to an excessive volume of blood received in culture bottles Performed at [facility], [address] [place], [place] [address]    CULT  12/29/2022 1955    NO GROWTH 5 DAYS Performed at [facility], [address], [place], [place] [address]    REPTSTATUS 01/03/2023 FINAL 12/29/2022 1955     Radiological Exams on Admission: MR Lumbar Spine W Wo Contrast  Result Date: 03/24/2023 CLINICAL DATA:  Back spasms, lower left  back pain, history of breast cancer with bone Mets EXAM: MRI LUMBAR SPINE WITHOUT AND WITH CONTRAST TECHNIQUE: Multiplanar and multiecho pulse sequences of the lumbar spine were obtained without and with intravenous contrast. CONTRAST:  7mL GADAVIST GADOBUTROL 1 MMOL/ML IV SOLN COMPARISON:  12/29/2022 MRI lumbar spine and 03/24/2023 CT lumbar spine FINDINGS: Segmentation:  5 lumbar type vertebral bodies. Alignment: No listhesis. Preservation of the normal lumbar lordosis. Vertebrae: Redemonstrated widespread osseous metastatic disease throughout the imaged lumbar spine and sacrum. Pathologic fracture at L2, with similar appearance of the superior endplate and increased inferior endplate fracture (series 10, image 10), now up to 35% height loss, previously up to 20%. Increased tumor at the posterior aspects of T12, eccentric to the right and in both pedicles, L1, eccentric to the right and in the right pedicle, L2, eccentric to the left, and L3, increased in the left pedicle, narrowing the spinal canal. Unchanged posterior extension, eccentric to the left, at L4. Epidural extension is also noted at T11, although this level is incompletely evaluated but likely increased from prior exam. Redemonstrated pathologic compression fractures involving superior endplates of T12 and L4. no evidence of discitis. Conus medullaris and cauda equina: Conus extends to the T12-L1 level. Conus and cauda equina appear normal. No abnormal enhancement. Paraspinal and other soft tissues: Edema in the medial left psoas. No lymphadenopathy. Disc levels: T12-L1: No significant disc bulge. No spinal canal stenosis or neural foraminal narrowing. L1-L2: No significant disc bulge. No spinal canal stenosis or neural foraminal narrowing. L2-L3: No significant disc bulge. Narrowing of the left aspect of the spinal canal due to osseous metastatic disease. No spinal canal stenosis or neural foraminal narrowing. L3-L4: No significant disc bulge. No  spinal canal stenosis or neural foraminal narrowing. Narrowing of the left greater than right lateral recess posterior to the L4 vertebral body (series 8, image 26), which could affect the descending L4 nerve roots. L4-L5: No significant disc bulge. No spinal canal stenosis. Mild right neural foraminal narrowing, secondary to osseous metastatic disease in the right pedicle of L4. L5-S1: No significant disc bulge. No spinal canal stenosis or neural foraminal narrowing. Possible impingement of the right extraforaminal L5 nerve secondary to osseous metastatic hypertrophy. IMPRESSION: 1. Redemonstrated widespread osseous metastatic disease, with unchanged pathologic compression fractures at T12 and L4 and increased height loss at L2, now  up to 35%, previously 20%. 2. Multilevel epidural extension of tumor. Epidural extension at T11 is incompletely evaluated but appears increased from the prior exam. 3. Narrowing of the left greater than right lateral recess posterior to the L4 vertebral body, secondary to epidural extension, which could affect the descending L4 nerve roots. 4. L4-L5 mild right neural foraminal narrowing, secondary to osseous metastatic disease in the right pedicle of L4. 5. Possible impingement of the right extraforaminal L5 nerve secondary to osseous metastatic hypertrophy. Electronically Signed   By: Wiliam Ke M.D.   On: 03/24/2023 18:14   CT L-SPINE NO CHARGE  Result Date: 03/24/2023 CLINICAL DATA:  Worsening left low back pain. Widespread metastatic breast cancer. EXAM: CT LUMBAR SPINE WITHOUT CONTRAST TECHNIQUE: Multidetector CT imaging of the lumbar spine was performed without intravenous contrast administration. Multiplanar CT image reconstructions were also generated. RADIATION DOSE REDUCTION: This exam was performed according to the departmental dose-optimization program which includes automated exposure control, adjustment of the mA and/or kV according to patient size and/or use of  iterative reconstruction technique. COMPARISON:  MRI 12/29/2022.  CT 12/29/2022. FINDINGS: Segmentation: 5 lumbar type vertebral bodies as numbered previously. Alignment: No malalignment. Vertebrae: Widespread lytic and sclerotic metastatic disease throughout the lumbar spine and sacrum, markedly progressive since 3 months ago. Only the inferior aspect of T10 is included on the study. It appears that there may be a worsened compression fracture at that level. Minor loss of height at T12 is stable. Minor loss of height on the left at L2 is stable. Superior endplate Schmorl's node type deformity at L4 is not significantly changed. Question developing epidural tumor in the ventral epidural space at L3 and L4, with extension towards the foramen on the left at L4-5. Consider MRI for better evaluation. Additionally, the progressive sacral disease could be encroaching upon the sacral neural foramina. Paraspinal and other soft tissues: See results of abdominal CT. Disc levels: No evidence of actual disc pathology. See above discussion regarding metastatic disease. IMPRESSION: 1. Widespread lytic and sclerotic metastatic disease throughout the lumbar spine and sacrum, markedly progressive since 3 months ago. Only the inferior aspect of T10 is included on the study, but it appears that there may be a worsened compression fracture at that level. Other minor compression deformities have not progressed. 2. Question developing epidural tumor in the ventral epidural space at L3 and L4, with extension towards the foramen on the left at L4-5. Consider MRI for better evaluation. 3. Additionally, the progressive sacral disease could be encroaching upon the sacral neural foramina. Electronically Signed   By: Paulina Fusi M.D.   On: 03/24/2023 15:09   CT Renal Stone Study  Result Date: 03/24/2023 CLINICAL DATA:  Abdominal and flank pain. Stone disease suspected. Left lower back symptoms. Metastatic breast cancer. EXAM: CT ABDOMEN AND  PELVIS WITHOUT CONTRAST TECHNIQUE: Multidetector CT imaging of the abdomen and pelvis was performed following the standard protocol without IV contrast. RADIATION DOSE REDUCTION: This exam was performed according to the departmental dose-optimization program which includes automated exposure control, adjustment of the mA and/or kV according to patient size and/or use of iterative reconstruction technique. COMPARISON:  12/29/2022 FINDINGS: Lower chest: Linear scarring at the lung bases. Hepatobiliary: Mild diffuse fatty change of the liver. No focal liver lesion seen without contrast. No calcified gallstones. Pancreas: Normal Spleen: Normal Adrenals/Urinary Tract: Adrenal glands are normal. Right kidney is normal. Left kidney contains a 2 mm nonobstructing stone in the midportion. No hydroureteronephrosis or passing stone. No stone  in the bladder. Stomach/Bowel: Stomach and small intestine are normal. No abnormal colon finding. Vascular/Lymphatic: Aorta and IVC are normal. No retroperitoneal lymphadenopathy. Reproductive: No pelvic mass. Other: No free fluid or air. Musculoskeletal: Marked progression of lytic and sclerotic metastatic disease throughout the spine, sacrum and pelvis. Minor endplate deformities as seen previously without interval frank compression fracture, though the patient would certainly be at risk of such. See results of lumbar spine CT. IMPRESSION: 1. Marked progression of lytic and sclerotic metastatic disease throughout the spine, sacrum and pelvis. Minor endplate deformities as seen previously without interval frank compression fracture, though the patient would certainly be at risk of such. See results of lumbar spine CT. 2. 2 mm nonobstructing stone in the midportion of the left kidney. No hydroureteronephrosis or passing stone. 3. Mild diffuse fatty change of the liver. Electronically Signed   By: Paulina Fusi M.D.   On: 03/24/2023 15:03    _______________________________________________________________________________________________________ Latest  Blood pressure 129/66, pulse 63, temperature 98.5 F (36.9 C), temperature source Oral, resp. rate 18, height 5\' 5"  (1.651 m), weight 69 kg, SpO2 98 %.   Vitals  labs and radiology finding personally reviewed  Review of Systems:    Pertinent positives include:  tingling,   Constitutional:  No weight loss, night sweats, Fevers, chills, fatigue, weight loss  HEENT:  No headaches, Difficulty swallowing,Tooth/dental problems,Sore throat,  No sneezing, itching, ear ache, nasal congestion, post nasal drip,  Cardio-vascular:  No chest pain, Orthopnea, PND, anasarca, dizziness, palpitations.no Bilateral lower extremity swelling  GI:  No heartburn, indigestion, abdominal pain, nausea, vomiting, diarrhea, change in bowel habits, loss of appetite, melena, blood in stool, hematemesis Resp:  no shortness of breath at rest. No dyspnea on exertion, No excess mucus, no productive cough, No non-productive cough, No coughing up of blood.No change in color of mucus.No wheezing. Skin:  no rash or lesions. No jaundice GU:  no dysuria, change in color of urine, no urgency or frequency. No straining to urinate.  No flank pain.  Musculoskeletal:  No joint pain or no joint swelling. No decreased range of motion. No back pain.  Psych:  No change in mood or affect. No depression or anxiety. No memory loss.  Neuro: no localizing neurological complaints, no no weakness, no double vision, no gait abnormality, no slurred speech, no confusion  All systems reviewed and apart from HOPI all are negative _______________________________________________________________________________________________ Past Medical History:   Past Medical History:  Diagnosis Date   Anemia    Anxiety    Bone metastasis    Breast cancer (HCC) 2021   Left breast invasive ductal carcinoma   Cancer (HCC) 2021   Left breast    Family history of adverse reaction to anesthesia    Father and Aunt have pseudocholinesterase deficiency - Trouble waking up from anesthesia   History of hiatal hernia    History of kidney stones    noted on CT Left nonobstructive     Past Surgical History:  Procedure Laterality Date   BREAST BIOPSY Right 07/21/2022   [place] RT BREAST BX W LOC DEV 1ST LESION IMG BX SPEC US GUIDE 07/21/2022 [facility] MAMMOGRAPHY   BREAST LUMPECTOMY WITH RADIOACTIVE SEED AND SENTINEL LYMPH NODE BIOPSY Left 06/04/2020   Procedure: LEFT BREAST LUMPECTOMY WITH RADIOACTIVE SEED AND SENTINEL LYMPH NODE MAPPING;  Surgeon: Harriette Bouillon, MD;  Location: [facility] OR;  Service: General;  Laterality: Left;  PEC BLOCK, RNFA   FEMUR IM NAIL Right 04/01/2021   Procedure: INTRAMEDULLARY (IM) NAIL FEMORAL;  Surgeon: Sheral Apley, MD;  Location: [facility] ORS;  Service: Orthopedics;  Laterality: Right;   ROBOTIC ASSISTED BILATERAL SALPINGO OOPHERECTOMY Bilateral 09/30/2022   Procedure: XI ROBOTIC ASSISTED BILATERAL SALPINGO OOPHORECTOMY;  Surgeon: Carver Fila, MD;  Location: [facility];  Service: Gynecology;  Laterality: Bilateral;   WISDOM TOOTH EXTRACTION      Social History:  Ambulatory   independently     reports that she quit smoking about 2 years ago. Her smoking use included cigarettes. She has never used smokeless tobacco. She reports that she does not currently use alcohol. She reports that she does not use drugs.     Family History:   Family History  Problem Relation Age of Onset   Colon cancer Neg Hx    Esophageal cancer Neg Hx    Stomach cancer Neg Hx    Rectal cancer Neg Hx    ______________________________________________________________________________________________ Allergies: Allergies  Allergen Reactions   Adhesive [Tape] Other (See Comments)    Blistering with steri strips   Fentanyl Itching, Nausea Only and Rash    08/11/2022 reports removing patch two days ago. Experienced "rash, itching,  nausea, headache and breathing" affected.      Phesgo [Pertuz-Trastuz-Hyaluron-Zzxf] Diarrhea and Rash    Phesgo side effect: Fixed drug eruption, profound diarrhea Therefore we will discontinue Phesgo and start her on Herceptin Hylecta Inj   Tetanus Toxoid Anaphylaxis   Succinylcholine Other (See Comments)    Her Body does not have enough enzymes to carry it out of her body     Prior to Admission medications   Medication Sig Start Date End Date Taking? Authorizing Provider  ALPRAZolam Prudy Feeler) 0.5 MG tablet Take 1 tablet (0.5 mg total) by mouth 3 (three) times daily as needed for anxiety. 01/05/23  Yes Serena Croissant, MD  amphetamine-dextroamphetamine (ADDERALL) 10 MG tablet Take 0.5-1 tablets (5-10 mg total) by mouth 2 (two) times daily as needed. 03/04/23  Yes   anastrozole (ARIMIDEX) 1 MG tablet Take 1 tablet (1 mg total) by mouth daily. 09/24/22  Yes Serena Croissant, MD  ascorbic acid (VITAMIN C) 500 MG tablet Take 500 mg by mouth daily.   Yes [provider]  Calcium 200 MG TABS 300 mg.   Yes [provider]  Cholecalciferol (VITAMIN D3) 50 MCG (2000 UT) capsule Vitamin D3   Yes [provider]  Ferrous Sulfate (SLOW FE) 142 (45 Fe) MG TBCR 1 tablet Orally Three times a Week   Yes [provider]  morphine (MS CONTIN) 30 MG 12 hr tablet Take 1 tablet (30 mg total) by mouth every 12 (twelve) hours. 03/11/23  Yes Serena Croissant, MD  Multiple Vitamin (MULTIVITAMIN ADULT PO) Multivitamin   Yes [provider]  oxyCODONE-acetaminophen (PERCOCET) 10-325 MG tablet Take 1 tablet by mouth every 8 (eight) hours as needed for pain. 03/11/23  Yes Serena Croissant, MD    ___________________________________________________________________________________________________ Physical Exam:    03/24/2023    8:03 PM 03/24/2023    5:00 PM 03/24/2023    2:45 PM  Vitals with BMI  Systolic 129 102 161  Diastolic 66 70 78  Pulse 63 79 64     1. General:  in No  Acute  distress   Chronically ill  -appearing 2. Psychological: Alert and   Oriented 3. Head/ENT:    Dry Mucous Membranes                          Head Non traumatic, neck  supple                          Normal  Dentition 4. SKIN:  decreased Skin turgor,  Skin clean Dry and intact no rash    5. Heart: Regular rate and rhythm no  Murmur, no Rub or gallop 6. Lungs , no wheezes or crackles   7. Abdomen: Soft,  non-tender, Non distended  bowel sounds present 8. Lower extremities: no clubbing, cyanosis, no  edema 9. Neurologically  strength 5 out of 5 in all 4 extremities cranial nerves II through XII intact decreased sensation over dorsum of 1-4 left hand digits 10. MSK: Normal range of motion    Chart has been reviewed  ______________________________________________________________________________________________  Assessment/Plan 47 y.o. 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 Pamelia Hoit will appreciate oncology consult  Pathological fracture of right hip [facility]) Currently undergoing palliative radiation therapy  Metastatic cancer to spine [facility]) Oncology is aware will see in AM   May need radiation for the new spinal lesions Ordered MRI cervical spine and thoracic spine  No bladder bowel incontinence Pain control   Other plan as per orders.  DVT prophylaxis:  SCD     Code Status: DNR/DNI as per patient   I had personally discussed CODE STATUS with patient   ACP has been reviewed     Family Communication:   Family not at  Bedside    Diet heart healthy   Disposition Plan:      To home once workup is complete and patient is stable   Following barriers for discharge:                                                  Will need consultants to evaluate patient prior to discharge        Consult Orders  (From admission, onward)           Start     Ordered   03/24/23 2025  Consult to hospitalist  Once       Provider:  (Not yet assigned)  Question Answer Comment  Place call to: [facility]   Reason for Consult Admit      03/24/23 2025            Consults called: oncology   Admission status:  ED Disposition     ED Disposition  Admit   Condition  --   Comment  The patient appears reasonably stabilized for admission considering the current resources, flow, and capabilities available in the ED at this time, and I doubt any other [facility] requiring further screening and/or treatment in the ED prior to admission is  present.           inpatient     I Expect 2 midnight stay secondary to severity of patient's current illness need for inpatient interventions justified by the following:     Severe  lab/radiological/exam abnormalities including:    Epidural mass and extensive comorbidities including:    malignancy,    That are currently affecting medical management.   I expect  patient to be hospitalized for 2 midnights requiring inpatient medical care.  Patient is at high risk for adverse outcome (such as loss of life or disability) if not treated.  Indication for inpatient stay as follows:    Need for operative/procedural  intervention     Need for   IV fluids,  IV pain medications,      Level of care      medical floor      Therisa Doyne 03/24/2023, 10:31 PM    [facility]     after 2 AM please page floor coverage PA If 7AM-7PM, please contact the day team taking care of the patient using [email or web address]

## 2023-03-24 NOTE — ED Notes (Addendum)
Assumed care of pt, found her alert and oriented in bed requesting food.  States she has not eaten in two days due to the recent pain.  She states that she does not want any emergency surgery tonight b/c her family is not local and she would want them close.  Food given to pt per provider's approval.  Pt states pain medicine is becoming less effective but was able to ambulate to the bathroom once helped up from a reclining position.

## 2023-03-24 NOTE — ED Triage Notes (Signed)
Arrived POV with reports of back spasms that started 2 days ago. Lower left back pain, atraumatic. Patient describes as spasms. States has Breast ca with bone mets to the bones. Just left oncology office from doing radiation treatment. Reports spasm in lumbar area. Full control of bowl and bladder. AAOX4, resp even and unlabored.

## 2023-03-24 NOTE — ED Notes (Signed)
Patient asking for crackers, informed her that she is NPO. Dr Wilkie Aye, MD at bedside, also has informed patient that she is NPO. Pt reports that even if she is a surgical patient she will be refusing surgical care. Dr Wilkie Aye aware and made this nurse aware. States patient can have crackers, but has verbalized understanding that it will prevent surgical care.

## 2023-03-24 NOTE — ED Provider Notes (Signed)
[facility] EMERGENCY DEPARTMENT AT [facility] Provider Note   CSN: [id number] Arrival date & time: 03/24/23  1143     History  Chief Complaint  Patient presents with   Back Pain    Spasm in lumbar area    Natalie Griffin is a 47 y.o. female.  HPI   47 year old female with past medical history of metastatic breast cancer with bone metastases presents emergency department acute on chronic back pain.  Patient takes opiate medication daily for chronic pain.  However she has developed left-sided flank pain gradually over the past 2 days.  She has been told before that she has left-sided kidney stones and she is concerned that she is passing a kidney stone.  Denies any genitourinary symptoms.  No fever or chills.  Denies any GI symptoms.  The pain is not midline, she has no associated neurosymptoms.  Denies any incontinence or difficulty walking.  Home Medications Prior to Admission medications   Medication Sig Start Date End Date Taking? Authorizing Provider  ALPRAZolam Prudy Feeler) 0.5 MG tablet Take 1 tablet (0.5 mg total) by mouth 3 (three) times daily as needed for anxiety. 01/05/23   Serena Croissant, MD  amphetamine-dextroamphetamine (ADDERALL) 10 MG tablet Take 0.5-1 tablets (5-10 mg total) by mouth 2 (two) times daily as needed. 03/04/23     anastrozole (ARIMIDEX) 1 MG tablet Take 1 tablet (1 mg total) by mouth daily. 09/24/22   Serena Croissant, MD  Calcium 200 MG TABS 300 mg.    [provider]  Cholecalciferol (VITAMIN D3) 50 MCG (2000 UT) capsule Vitamin D3    [provider]  Ferrous Sulfate (SLOW FE) 142 (45 Fe) MG TBCR 1 tablet Orally Three times a Week    [provider]  morphine (MS CONTIN) 30 MG 12 hr tablet Take 1 tablet (30 mg total) by mouth every 12 (twelve) hours. 03/11/23   Serena Croissant, MD  Multiple Vitamin (MULTIVITAMIN ADULT PO) Multivitamin    [provider]  oxyCODONE-acetaminophen (PERCOCET) 10-325 MG tablet Take 1 tablet  by mouth every 8 (eight) hours as needed for pain. 03/11/23   Serena Croissant, MD      Allergies    Adhesive [tape], Fentanyl, Phesgo [pertuz-trastuz-hyaluron-zzxf], Tetanus toxoid, and Succinylcholine    Review of Systems   Review of Systems  Constitutional:  Positive for fatigue. Negative for fever.  Respiratory:  Negative for shortness of breath.   Cardiovascular:  Negative for chest pain.  Gastrointestinal:  Negative for abdominal pain, diarrhea, nausea and vomiting.  Genitourinary:  Positive for flank pain. Negative for decreased urine volume, difficulty urinating, dysuria, frequency, hematuria, vaginal bleeding and vaginal discharge.  Musculoskeletal:  Positive for back pain.  Skin:  Negative for rash.  Neurological:  Negative for headaches.    Physical Exam Updated Vital Signs BP 113/78   Pulse 64   Temp 98.8 F (37.1 C) (Oral)   Resp 18   Ht 5\' 5"  (1.651 m)   Wt 69 kg   SpO2 95%   BMI 25.31 kg/m  Physical Exam Vitals and nursing note reviewed.  Constitutional:      General: She is not in acute distress.    Appearance: Normal appearance.  HENT:     Head: Normocephalic.     Mouth/Throat:     Mouth: Mucous membranes are moist.  Cardiovascular:     Rate and Rhythm: Normal rate.  Pulmonary:     Effort: Pulmonary effort is normal. No respiratory distress.  Abdominal:  Palpations: Abdomen is soft.     Tenderness: There is no abdominal tenderness. There is left CVA tenderness.  Musculoskeletal:     Comments: Mildly reproducible left flank tenderness to palpation, no overlying skin changes or findings of shingles  Skin:    General: Skin is warm.  Neurological:     Mental Status: She is alert and oriented to person, place, and time. Mental status is at baseline.  Psychiatric:        Mood and Affect: Mood normal.     ED Results / Procedures / Treatments   Labs (all labs ordered are listed, but only abnormal results are displayed) Labs Reviewed  CBC WITH  DIFFERENTIAL/PLATELET - Abnormal; Notable for the following components:      Result Value   WBC 11.0 (*)    All other components within normal limits  BASIC METABOLIC PANEL - Abnormal; Notable for the following components:   Glucose, Bld 113 (*)    Calcium 10.4 (*)    All other components within normal limits  URINALYSIS, ROUTINE W REFLEX MICROSCOPIC - Abnormal; Notable for the following components:   APPearance HAZY (*)    All other components within normal limits  PREGNANCY, URINE    EKG None  Radiology CT L-SPINE NO CHARGE  Result Date: 03/24/2023 CLINICAL DATA:  Worsening left low back pain. Widespread metastatic breast cancer. EXAM: CT LUMBAR SPINE WITHOUT CONTRAST TECHNIQUE: Multidetector CT imaging of the lumbar spine was performed without intravenous contrast administration. Multiplanar CT image reconstructions were also generated. RADIATION DOSE REDUCTION: This exam was performed according to the departmental dose-optimization program which includes automated exposure control, adjustment of the mA and/or kV according to patient size and/or use of iterative reconstruction technique. COMPARISON:  MRI 12/29/2022.  CT 12/29/2022. FINDINGS: Segmentation: 5 lumbar type vertebral bodies as numbered previously. Alignment: No malalignment. Vertebrae: Widespread lytic and sclerotic metastatic disease throughout the lumbar spine and sacrum, markedly progressive since 3 months ago. Only the inferior aspect of T10 is included on the study. It appears that there may be a worsened compression fracture at that level. Minor loss of height at T12 is stable. Minor loss of height on the left at L2 is stable. Superior endplate Schmorl's node type deformity at L4 is not significantly changed. Question developing epidural tumor in the ventral epidural space at L3 and L4, with extension towards the foramen on the left at L4-5. Consider MRI for better evaluation. Additionally, the progressive sacral disease could  be encroaching upon the sacral neural foramina. Paraspinal and other soft tissues: See results of abdominal CT. Disc levels: No evidence of actual disc pathology. See above discussion regarding metastatic disease. IMPRESSION: 1. Widespread lytic and sclerotic metastatic disease throughout the lumbar spine and sacrum, markedly progressive since 3 months ago. Only the inferior aspect of T10 is included on the study, but it appears that there may be a worsened compression fracture at that level. Other minor compression deformities have not progressed. 2. Question developing epidural tumor in the ventral epidural space at L3 and L4, with extension towards the foramen on the left at L4-5. Consider MRI for better evaluation. 3. Additionally, the progressive sacral disease could be encroaching upon the sacral neural foramina. Electronically Signed   By: Paulina Fusi M.D.   On: 03/24/2023 15:09   CT Renal Stone Study  Result Date: 03/24/2023 CLINICAL DATA:  Abdominal and flank pain. Stone disease suspected. Left lower back symptoms. Metastatic breast cancer. EXAM: CT ABDOMEN AND PELVIS WITHOUT CONTRAST TECHNIQUE:  Multidetector CT imaging of the abdomen and pelvis was performed following the standard protocol without IV contrast. RADIATION DOSE REDUCTION: This exam was performed according to the departmental dose-optimization program which includes automated exposure control, adjustment of the mA and/or kV according to patient size and/or use of iterative reconstruction technique. COMPARISON:  12/29/2022 FINDINGS: Lower chest: Linear scarring at the lung bases. Hepatobiliary: Mild diffuse fatty change of the liver. No focal liver lesion seen without contrast. No calcified gallstones. Pancreas: Normal Spleen: Normal Adrenals/Urinary Tract: Adrenal glands are normal. Right kidney is normal. Left kidney contains a 2 mm nonobstructing stone in the midportion. No hydroureteronephrosis or passing stone. No stone in the bladder.  Stomach/Bowel: Stomach and small intestine are normal. No abnormal colon finding. Vascular/Lymphatic: Aorta and IVC are normal. No retroperitoneal lymphadenopathy. Reproductive: No pelvic mass. Other: No free fluid or air. Musculoskeletal: Marked progression of lytic and sclerotic metastatic disease throughout the spine, sacrum and pelvis. Minor endplate deformities as seen previously without interval frank compression fracture, though the patient would certainly be at risk of such. See results of lumbar spine CT. IMPRESSION: 1. Marked progression of lytic and sclerotic metastatic disease throughout the spine, sacrum and pelvis. Minor endplate deformities as seen previously without interval frank compression fracture, though the patient would certainly be at risk of such. See results of lumbar spine CT. 2. 2 mm nonobstructing stone in the midportion of the left kidney. No hydroureteronephrosis or passing stone. 3. Mild diffuse fatty change of the liver. Electronically Signed   By: Paulina Fusi M.D.   On: 03/24/2023 15:03    Procedures Procedures    Medications Ordered in ED Medications  sodium chloride 0.9 % bolus 500 mL (0 mLs Intravenous Stopped 03/24/23 1425)  HYDROmorphone (DILAUDID) injection 1 mg (1 mg Intravenous Given 03/24/23 1351)  ondansetron (ZOFRAN) injection 4 mg (4 mg Intravenous Given 03/24/23 1521)  HYDROmorphone (DILAUDID) injection 1 mg (1 mg Intravenous Given 03/24/23 1553)    ED Course/ Medical Decision Making/ A&P                             Medical Decision Making Amount and/or Complexity of Data Reviewed Labs: ordered. Radiology: ordered.  Risk Prescription drug management.   47 year old female with metastatic breast cancer presents emergency department with acute on chronic back pain that is in the left flank area. This is somewhat reproducible on palpation, no overlying skin changes or signs of shingles.  Recent admission and discharge for possible osteomyelitis of the  spine, not currently on antibiotics.  Workup from a kidney stone standpoint is reassuring.  Urinalysis and blood work are normal, CT renal stone study read identifies left kidney stones but no passing stone.  However CT of the lumbar spine shows concern for developing epidural mass in the L3-L4.  This appears to be a new finding.  I have recommended progressing with MRI.  Of note patient has been instructed to stay n.p.o.  However she states even if she has to have surgery tonight she will be refusing and understands that refusing surgery could end in permanent disability, paralysis or death.  She has eat a cracker against her wishes.  Pain medicine ordered, MRI ordered.  Patient signed out to Dr. Jearld Fenton Pending lab results and reevaluation.        Final Clinical Impression(s) / ED Diagnoses Final diagnoses:  None    Rx / DC Orders ED Discharge Orders  None         Rozelle Logan, DO 03/24/23 1722

## 2023-03-24 NOTE — Assessment & Plan Note (Signed)
Oncology is aware will see in AM   May need radiation for the new spinal lesions Ordered MRI cervical spine and thoracic spine  No bladder bowel incontinence Pain control

## 2023-03-25 ENCOUNTER — Inpatient Hospital Stay (HOSPITAL_COMMUNITY): Payer: 59

## 2023-03-25 ENCOUNTER — Other Ambulatory Visit: Payer: Self-pay

## 2023-03-25 DIAGNOSIS — G893 Neoplasm related pain (acute) (chronic): Secondary | ICD-10-CM

## 2023-03-25 LAB — COMPREHENSIVE METABOLIC PANEL
ALT: 34 U/L (ref 0–44)
AST: 58 U/L — ABNORMAL HIGH (ref 15–41)
Albumin: 3.8 g/dL (ref 3.5–5.0)
Alkaline Phosphatase: 348 U/L — ABNORMAL HIGH (ref 38–126)
Anion gap: 12 (ref 5–15)
BUN: 12 mg/dL (ref 6–20)
CO2: 22 mmol/L (ref 22–32)
Calcium: 9.9 mg/dL (ref 8.9–10.3)
Chloride: 101 mmol/L (ref 98–111)
Creatinine, Ser: 0.82 mg/dL (ref 0.44–1.00)
GFR, Estimated: >60 mL/min (ref >60)
Glucose, Bld: 103 mg/dL — ABNORMAL HIGH (ref 70–99)
Potassium: 3.9 mmol/L (ref 3.5–5.1)
Sodium: 135 mmol/L (ref 135–145)
Total Bilirubin: 0.4 mg/dL (ref 0.3–1.2)
Total Protein: 7.4 g/dL (ref 6.5–8.1)

## 2023-03-25 LAB — CBC
HCT: 34.2 % — ABNORMAL LOW (ref 36.0–46.0)
Hemoglobin: 10.7 g/dL — ABNORMAL LOW (ref 12.0–15.0)
MCH: 27.9 pg (ref 26.0–34.0)
MCHC: 31.3 g/dL (ref 30.0–36.0)
MCV: 89.3 fL (ref 80.0–100.0)
Platelets: 330 10*3/uL (ref 150–400)
RBC: 3.83 MIL/uL — ABNORMAL LOW (ref 3.87–5.11)
RDW: 15.2 % (ref 11.5–15.5)
WBC: 8.3 10*3/uL (ref 4.0–10.5)
nRBC: 0 % (ref 0.0–0.2)

## 2023-03-25 LAB — PHOSPHORUS: Phosphorus: 5.9 mg/dL — ABNORMAL HIGH (ref 2.5–4.6)

## 2023-03-25 LAB — MAGNESIUM: Magnesium: 2.1 mg/dL (ref 1.7–2.4)

## 2023-03-25 LAB — C-REACTIVE PROTEIN: CRP: 4.5 mg/dL — ABNORMAL HIGH (ref <1.0)

## 2023-03-25 MED ORDER — MORPHINE SULFATE ER 15 MG PO TBCR
30.0000 mg | EXTENDED_RELEASE_TABLET | Freq: Once | ORAL | Status: AC
  Administered 2023-03-25: 30 mg via ORAL
  Filled 2023-03-25: qty 2

## 2023-03-25 MED ORDER — MORPHINE SULFATE ER 15 MG PO TBCR
60.0000 mg | EXTENDED_RELEASE_TABLET | Freq: Two times a day (BID) | ORAL | Status: DC
  Administered 2023-03-25 – 2023-03-27 (×4): 60 mg via ORAL
  Filled 2023-03-25 (×4): qty 4

## 2023-03-25 MED ORDER — ALPRAZOLAM 0.5 MG PO TABS
0.5000 mg | ORAL_TABLET | Freq: Three times a day (TID) | ORAL | Status: DC | PRN
  Administered 2023-03-25: 0.5 mg via ORAL
  Filled 2023-03-25: qty 1

## 2023-03-25 MED ORDER — VITAMIN C 500 MG PO TABS
500.0000 mg | ORAL_TABLET | Freq: Every day | ORAL | Status: DC
  Administered 2023-03-25 – 2023-03-27 (×3): 500 mg via ORAL
  Filled 2023-03-25 (×3): qty 1

## 2023-03-25 MED ORDER — ANASTROZOLE 1 MG PO TABS
1.0000 mg | ORAL_TABLET | Freq: Every day | ORAL | Status: DC
  Administered 2023-03-25 – 2023-03-27 (×3): 1 mg via ORAL
  Filled 2023-03-25 (×3): qty 1

## 2023-03-25 MED ORDER — GADOBUTROL 1 MMOL/ML IV SOLN
7.0000 mL | Freq: Once | INTRAVENOUS | Status: AC | PRN
  Administered 2023-03-25: 7 mL via INTRAVENOUS

## 2023-03-25 NOTE — Plan of Care (Signed)

## 2023-03-25 NOTE — Progress Notes (Signed)
Chaplain received a consult that Farrell wants to update her advance directives to assign her sister rather than her best friend.  She was very tired this afternoon and decided to wait until tomorrow for filling out the paperwork again.  [address] [place] Pager, [phone]

## 2023-03-25 NOTE — Progress Notes (Signed)
Initial Nutrition Assessment  DOCUMENTATION CODES:   Not applicable  INTERVENTION:  - Liberalize to Regular diet to avoid restricting intake.  - Encourage intake at all meals.  - Monitor weight trends.  Nutrition status stable at this time, will sign off. Please re-consult if needed.   NUTRITION DIAGNOSIS:   Increased nutrient needs related to chronic illness (breast cancer with mets to bone) as evidenced by estimated needs  GOAL:   Patient will meet greater than or equal to 90% of their needs  MONITOR:   PO intake, Weight trends  REASON FOR ASSESSMENT:   Consult Assessment of nutrition requirement/status  ASSESSMENT:   47 y.o. female with PMH breast cancer with mets to bone (currently undergoing palliative radiation therapy) who presented with back pain. Admitted for cancer associated pain.   Patient endorses a UBW of 186# and intentional steady weight loss over the past 6 months.  Per EMR, patient has had a 22# or 12% weight loss in 3 months, which is significant for the time frame. However, patient states this has all been intentional weight loss from eating healthier.   She reports eating 1 meal a day and around 1-2 snacks. Eats a lot of greek and mediterranean inspired food including a lot of vegetables and fruit. Only eats protein (usually fish) twice a week but eats a lot of hummus, beans, cottage cheese.  Appetite has been normal/good until the past few days when her pain become overwhelming. Thankfully, this is now more controlled and they reports her appetite is better. Documented to have consuming 100% of breakfast this AM.  She is on a heart healthy diet and notes this limited her breakfast. Will liberalize diet to Regular. Encouraged patient to try to consume 3 meals a day as tolerated to help prevent unintentional weight loss during admission.   Medications reviewed and include: Colace, 500mg  vitamin C, Morphine  Labs reviewed:  -   NUTRITION - FOCUSED  PHYSICAL EXAM:  Flowsheet Row Most Recent Value  Orbital Region No depletion  Upper Arm Region No depletion  Thoracic and Lumbar Region No depletion  Buccal Region No depletion  Temple Region No depletion  Clavicle Bone Region No depletion  Clavicle and Acromion Bone Region No depletion  Scapular Bone Region Unable to assess  Dorsal Hand No depletion  Patellar Region No depletion  Anterior Thigh Region No depletion  Posterior Calf Region No depletion  Edema (RD Assessment) None  Hair Reviewed  Eyes Reviewed  Mouth Reviewed  Skin Reviewed  Nails Reviewed       Diet Order:   Diet Order             Diet regular Room service appropriate? Yes; Fluid consistency: Thin  Diet effective now                   EDUCATION NEEDS:  Education needs have been addressed  Skin:  Skin Assessment: Reviewed RN Assessment  Last BM:  7/2  Height:  Ht Readings from Last 1 Encounters:  03/24/23 5\' 5"  (1.651 m)   Weight:  Wt Readings from Last 1 Encounters:  03/24/23 69 kg    BMI:  Body mass index is 25.31 kg/m.  Estimated Nutritional Needs:  Kcal:  1950-2200 kcals Protein:  80-95 grams Fluid:  >/= 1.9L    Shelle Iron RD, LDN For contact information, refer to [facility].

## 2023-03-25 NOTE — TOC Initial Note (Signed)
Transition of Care [facility]) - Initial/Assessment Note    Patient Details  Name: Natalie Griffin MRN: [id number] Date of Birth: 06-19-1976  Transition of Care [facility]) CM/SW Contact:    Howell Rucks, RN Phone Number: 03/25/2023, 9:53 AM  Clinical Narrative: Met with pt at bedside to introduce role of TOC/NCM and review for dc planning. Pt reports she lives alone, her family is in [place] but she has support from local friends, reports no home DME or home care services, pt has PCP in place. Pt had no questions/concerns. TOC will continue to follow.   Expected Discharge Plan: Home/Self Care Barriers to Discharge: Continued Medical Work up   Patient Goals and CMS Choice Patient states their goals for this hospitalization and ongoing recovery are:: Home          Expected Discharge Plan and Services       Living arrangements for the past 2 months: Apartment                                      Prior Living Arrangements/Services Living arrangements for the past 2 months: Apartment Lives with:: Self Patient language and need for interpreter reviewed:: Yes Do you feel safe going back to the place where you live?: Yes      Need for Family Participation in Patient Care: Yes (Comment) Care giver support system in place?: Yes (comment)   Criminal Activity/Legal Involvement Pertinent to Current Situation/Hospitalization: No - Comment as needed  Activities of Daily Living Home Assistive Devices/Equipment: None ADL Screening (condition at time of admission) Patient's cognitive ability adequate to safely complete daily activities?: Yes Is the patient deaf or have difficulty hearing?: No Does the patient have difficulty seeing, even when wearing glasses/contacts?: No Does the patient have difficulty concentrating, remembering, or making decisions?: No Patient able to express need for assistance with ADLs?: Yes Does the patient have difficulty dressing or bathing?:  No Independently performs ADLs?: Yes (appropriate for developmental age) Does the patient have difficulty walking or climbing stairs?: No Weakness of Legs: None Weakness of Arms/Hands: None  Permission Sought/Granted Permission sought to share information with : Case Manager Permission granted to share information with : Yes, Verbal Permission Granted  Share Information with NAME: Fannie Knee, RN           Emotional Assessment Appearance:: Appears stated age Attitude/Demeanor/Rapport: Gracious Affect (typically observed): Accepting Orientation: : Oriented to Self, Oriented to Place, Oriented to  Time, Oriented to Situation Alcohol / Substance Use: Not Applicable Psych Involvement: No (comment)  Admission diagnosis:  Cancer associated pain [G89.3] Mass in epidural space [G96.198] Tingling of left upper extremity [R20.2] Acute left-sided low back pain without sciatica [M54.50] Patient Active Problem List   Diagnosis Date Noted   Cancer associated pain 03/24/2023   Closed wedge compression fracture of T10 vertebra (HCC) 12/29/2022   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 [place] [address] Phone: [phone] Fax: [phone]  [facility] [address] [place] [address] Phone: [phone] Fax: [phone]     Social Determinants of  Health (SDOH) Social History: SDOH Screenings   Food Insecurity: No Food Insecurity (03/24/2023)  Recent Concern: Food Insecurity - Food Insecurity Present (03/22/2023)  Housing: Low Risk  (03/24/2023)  Transportation Needs: No Transportation Needs (03/24/2023)   Utilities: Not At Risk (03/24/2023)  Depression (PHQ2-9): Low Risk  (03/22/2023)  Tobacco Use: Medium Risk (03/24/2023)   SDOH Interventions:     Readmission Risk Interventions    03/25/2023    9:52 AM  Readmission Risk Prevention Plan  Transportation Screening Complete  PCP or Specialist Appt within 5-7 Days Complete  Home Care Screening Complete  Medication Review (RN CM) Complete

## 2023-03-25 NOTE — Progress Notes (Signed)
[facility] Progress Note  Patient: Natalie Griffin     WUJ:[id number]  DOA: 03/24/2023   PCP: Charlane Ferretti, DO       Brief hospital course: This is a 47 year old female with breast cancer initially diagnosed in 2021, now metastatic with metastasis to left femur who is receiving palliative radiation to this area along with chemotherapy and anastrozole.  When diagnosed in 9/21, she underwent a left lumpectomy which showed clear margins and then refused adjuvant chemo, adjuvant radiation and adjuvant antiestrogen therapy.  Recently the patient had increasing pain in her back which was uncontrolled with oral narcotics at home and therefore presented to the hospital.  Subjective:  Complains of pain in her lumbar area towards the left.  She states she initially felt that it was kidney related.  Pain is 8-9 out of 10 when she attempts to ambulate and sharp in nature.  After pain medication with IV Dilaudid, the pain improves to about a 5 or 6.  She states that she has been unable to sleep properly because of this pain.  She does admit to constipation as well and fears that having a bowel movement will increase her pain.  She has had poor oral intake over the last few days.  Assessment and Plan: Principal Problem:   Cancer associated pain -Reviewed L-spine MRI which reveals widespread osseous metastatic disease, pathological compression fractures which are unchanged in L2, L4 and T12.  L2 compression has increased in height from 20 to 35%.  She has epidural extension of tumor, narrowing of the right lateral recess posterior to the L4 vertebral body which could affect the descending L4 nerve root, L4-L5 foraminal narrowing which is mild and possible impingement of the right extraforaminal L5 nerve. The patient's pain is just lateral to L2-L3 and on the left and is exacerbated by movement - She takes MS Contin 30 mg twice daily which we plan to increase to 60 mg twice daily today - Will  continue Robaxin - Oxycodone and IV Dilaudid to be given for breakthrough pain - Will complete imaging of the C-spine and T-spine as ordered -Have spoken with Dr. Kathrynn Running who has been giving palliative radiation treatments to femur and I have asked him to review the MRIs see if further palliative radiation is necessary  Active Problems:   Malignant neoplasm of upper-inner quadrant of left breast in female, estrogen receptor positive (HCC) -Follows with Dr. Pamelia Hoit -Continue anastrozole       Code Status: DNR Consultants: Radiation oncology Level of Care: Level of care: Med-Surg Total time on patient care: 35 minutes DVT prophylaxis:  SCDs Start: 03/24/23 2300     Objective:   Vitals:   03/24/23 2250 03/25/23 0251 03/25/23 0514 03/25/23 1413  BP: 115/85 105/69 100/67 (!) 93/47  Pulse: 81 61 (!) 55 (!) 57  Resp: 16 16 16 17   Temp: 98 F (36.7 C) 99 F (37.2 C) 99.2 F (37.3 C) 98.5 F (36.9 C)  TempSrc: Oral Oral Oral Oral  SpO2: 96% 100% 99% 100%  Weight:      Height:       Filed Weights   03/24/23 1157  Weight: 69 kg   Exam: General exam: Appears uncomfortable, tearful HEENT: oral mucosa moist Respiratory system: Clear to auscultation.  Cardiovascular system: S1 & S2 heard  Gastrointestinal system: Abdomen soft, non-tender, nondistended. Normal bowel sounds   Extremities: No cyanosis, clubbing or edema MSK: tenderness noted left of upper L spine (paraspinal area) Psychiatry:  Mood &  affect appropriate.      CBC: Recent Labs  Lab 03/24/23 1349 03/25/23 0044  WBC 11.0* 8.3  NEUTROABS 7.4  --   HGB 12.2 10.7*  HCT 39.0 34.2*  MCV 88.4 89.3  PLT 391 330   Basic Metabolic Panel: Recent Labs  Lab 03/24/23 1349 03/25/23 0044  NA 137 135  K 3.9 3.9  CL 100 101  CO2 23 22  GLUCOSE 113* 103*  BUN 10 12  CREATININE 0.77 0.82  CALCIUM 10.4* 9.9  MG 2.1 2.1  PHOS 5.5* 5.9*   GFR: Estimated Creatinine Clearance: 81.9 mL/min (by C-G formula based on  SCr of 0.82 mg/dL).  Scheduled Meds:  anastrozole  1 mg Oral Daily   ascorbic acid  500 mg Oral Daily   docusate sodium  100 mg Oral BID   morphine  60 mg Oral Q12H   Continuous Infusions:  methocarbamol (ROBAXIN) IV 500 mg (03/25/23 1008)   Imaging and lab data was personally reviewed MR Lumbar Spine W Wo Contrast  Result Date: 03/24/2023 CLINICAL DATA:  Back spasms, lower left back pain, history of breast cancer with bone Mets EXAM: MRI LUMBAR SPINE WITHOUT AND WITH CONTRAST TECHNIQUE: Multiplanar and multiecho pulse sequences of the lumbar spine were obtained without and with intravenous contrast. CONTRAST:  7mL GADAVIST GADOBUTROL 1 MMOL/ML IV SOLN COMPARISON:  12/29/2022 MRI lumbar spine and 03/24/2023 CT lumbar spine FINDINGS: Segmentation:  5 lumbar type vertebral bodies. Alignment: No listhesis. Preservation of the normal lumbar lordosis. Vertebrae: Redemonstrated widespread osseous metastatic disease throughout the imaged lumbar spine and sacrum. Pathologic fracture at L2, with similar appearance of the superior endplate and increased inferior endplate fracture (series 10, image 10), now up to 35% height loss, previously up to 20%. Increased tumor at the posterior aspects of T12, eccentric to the right and in both pedicles, L1, eccentric to the right and in the right pedicle, L2, eccentric to the left, and L3, increased in the left pedicle, narrowing the spinal canal. Unchanged posterior extension, eccentric to the left, at L4. Epidural extension is also noted at T11, although this level is incompletely evaluated but likely increased from prior exam. Redemonstrated pathologic compression fractures involving superior endplates of T12 and L4. no evidence of discitis. Conus medullaris and cauda equina: Conus extends to the T12-L1 level. Conus and cauda equina appear normal. No abnormal enhancement. Paraspinal and other soft tissues: Edema in the medial left psoas. No lymphadenopathy. Disc levels:  T12-L1: No significant disc bulge. No spinal canal stenosis or neural foraminal narrowing. L1-L2: No significant disc bulge. No spinal canal stenosis or neural foraminal narrowing. L2-L3: No significant disc bulge. Narrowing of the left aspect of the spinal canal due to osseous metastatic disease. No spinal canal stenosis or neural foraminal narrowing. L3-L4: No significant disc bulge. No spinal canal stenosis or neural foraminal narrowing. Narrowing of the left greater than right lateral recess posterior to the L4 vertebral body (series 8, image 26), which could affect the descending L4 nerve roots. L4-L5: No significant disc bulge. No spinal canal stenosis. Mild right neural foraminal narrowing, secondary to osseous metastatic disease in the right pedicle of L4. L5-S1: No significant disc bulge. No spinal canal stenosis or neural foraminal narrowing. Possible impingement of the right extraforaminal L5 nerve secondary to osseous metastatic hypertrophy. IMPRESSION: 1. Redemonstrated widespread osseous metastatic disease, with unchanged pathologic compression fractures at T12 and L4 and increased height loss at L2, now up to 35%, previously 20%. 2. Multilevel epidural extension of tumor.  Epidural extension at T11 is incompletely evaluated but appears increased from the prior exam. 3. Narrowing of the left greater than right lateral recess posterior to the L4 vertebral body, secondary to epidural extension, which could affect the descending L4 nerve roots. 4. L4-L5 mild right neural foraminal narrowing, secondary to osseous metastatic disease in the right pedicle of L4. 5. Possible impingement of the right extraforaminal L5 nerve secondary to osseous metastatic hypertrophy. Electronically Signed   By: Wiliam Ke M.D.   On: 03/24/2023 18:14   CT L-SPINE NO CHARGE  Result Date: 03/24/2023 CLINICAL DATA:  Worsening left low back pain. Widespread metastatic breast cancer. EXAM: CT LUMBAR SPINE WITHOUT CONTRAST  TECHNIQUE: Multidetector CT imaging of the lumbar spine was performed without intravenous contrast administration. Multiplanar CT image reconstructions were also generated. RADIATION DOSE REDUCTION: This exam was performed according to the departmental dose-optimization program which includes automated exposure control, adjustment of the mA and/or kV according to patient size and/or use of iterative reconstruction technique. COMPARISON:  MRI 12/29/2022.  CT 12/29/2022. FINDINGS: Segmentation: 5 lumbar type vertebral bodies as numbered previously. Alignment: No malalignment. Vertebrae: Widespread lytic and sclerotic metastatic disease throughout the lumbar spine and sacrum, markedly progressive since 3 months ago. Only the inferior aspect of T10 is included on the study. It appears that there may be a worsened compression fracture at that level. Minor loss of height at T12 is stable. Minor loss of height on the left at L2 is stable. Superior endplate Schmorl's node type deformity at L4 is not significantly changed. Question developing epidural tumor in the ventral epidural space at L3 and L4, with extension towards the foramen on the left at L4-5. Consider MRI for better evaluation. Additionally, the progressive sacral disease could be encroaching upon the sacral neural foramina. Paraspinal and other soft tissues: See results of abdominal CT. Disc levels: No evidence of actual disc pathology. See above discussion regarding metastatic disease. IMPRESSION: 1. Widespread lytic and sclerotic metastatic disease throughout the lumbar spine and sacrum, markedly progressive since 3 months ago. Only the inferior aspect of T10 is included on the study, but it appears that there may be a worsened compression fracture at that level. Other minor compression deformities have not progressed. 2. Question developing epidural tumor in the ventral epidural space at L3 and L4, with extension towards the foramen on the left at L4-5.  Consider MRI for better evaluation. 3. Additionally, the progressive sacral disease could be encroaching upon the sacral neural foramina. Electronically Signed   By: Paulina Fusi M.D.   On: 03/24/2023 15:09   CT Renal Stone Study  Result Date: 03/24/2023 CLINICAL DATA:  Abdominal and flank pain. Stone disease suspected. Left lower back symptoms. Metastatic breast cancer. EXAM: CT ABDOMEN AND PELVIS WITHOUT CONTRAST TECHNIQUE: Multidetector CT imaging of the abdomen and pelvis was performed following the standard protocol without IV contrast. RADIATION DOSE REDUCTION: This exam was performed according to the departmental dose-optimization program which includes automated exposure control, adjustment of the mA and/or kV according to patient size and/or use of iterative reconstruction technique. COMPARISON:  12/29/2022 FINDINGS: Lower chest: Linear scarring at the lung bases. Hepatobiliary: Mild diffuse fatty change of the liver. No focal liver lesion seen without contrast. No calcified gallstones. Pancreas: Normal Spleen: Normal Adrenals/Urinary Tract: Adrenal glands are normal. Right kidney is normal. Left kidney contains a 2 mm nonobstructing stone in the midportion. No hydroureteronephrosis or passing stone. No stone in the bladder. Stomach/Bowel: Stomach and small intestine are normal. No  abnormal colon finding. Vascular/Lymphatic: Aorta and IVC are normal. No retroperitoneal lymphadenopathy. Reproductive: No pelvic mass. Other: No free fluid or air. Musculoskeletal: Marked progression of lytic and sclerotic metastatic disease throughout the spine, sacrum and pelvis. Minor endplate deformities as seen previously without interval frank compression fracture, though the patient would certainly be at risk of such. See results of lumbar spine CT. IMPRESSION: 1. Marked progression of lytic and sclerotic metastatic disease throughout the spine, sacrum and pelvis. Minor endplate deformities as seen previously without  interval frank compression fracture, though the patient would certainly be at risk of such. See results of lumbar spine CT. 2. 2 mm nonobstructing stone in the midportion of the left kidney. No hydroureteronephrosis or passing stone. 3. Mild diffuse fatty change of the liver. Electronically Signed   By: Paulina Fusi M.D.   On: 03/24/2023 15:03    LOS: 1 day   Author: Calvert Cantor  03/25/2023 2:49 PM  To contact [facility]>   Check the care team in [facility] and look for the attending/consulting [facility] provider listed  Log into [email or web address] and use [facility] universal password   Go to> "[facility]"  and find provider  If you still have difficulty reaching the provider, please page the [facility] (Director on Call) for the Hospitalists listed on amion

## 2023-03-26 ENCOUNTER — Ambulatory Visit: Admission: RE | Admit: 2023-03-26 | Payer: 59 | Source: Ambulatory Visit

## 2023-03-26 ENCOUNTER — Ambulatory Visit
Admission: RE | Admit: 2023-03-26 | Discharge: 2023-03-26 | Disposition: A | Discharge status: Home / Self Care | Payer: 59 | Source: Ambulatory Visit | Attending: Radiation Oncology | Admitting: Radiation Oncology

## 2023-03-26 ENCOUNTER — Ambulatory Visit: Payer: 59

## 2023-03-26 ENCOUNTER — Other Ambulatory Visit: Payer: Self-pay

## 2023-03-26 DIAGNOSIS — C50212 Malignant neoplasm of upper-inner quadrant of left female breast: Secondary | ICD-10-CM

## 2023-03-26 DIAGNOSIS — G893 Neoplasm related pain (acute) (chronic): Secondary | ICD-10-CM | POA: Diagnosis not present

## 2023-03-26 DIAGNOSIS — C7951 Secondary malignant neoplasm of bone: Secondary | ICD-10-CM

## 2023-03-26 LAB — RAD ONC ARIA SESSION SUMMARY
Course Elapsed Days: 3
Plan Fractions Treated to Date: 3
Plan Prescribed Dose Per Fraction: 3 Gy
Plan Total Fractions Prescribed: 10
Plan Total Prescribed Dose: 30 Gy
Reference Point Dosage Given to Date: 9 Gy
Reference Point Session Dosage Given: 3 Gy
Session Number: 3

## 2023-03-26 MED ORDER — METHOCARBAMOL 500 MG PO TABS
500.0000 mg | ORAL_TABLET | Freq: Four times a day (QID) | ORAL | Status: DC | PRN
  Administered 2023-03-27 (×3): 500 mg via ORAL
  Filled 2023-03-26 (×3): qty 1

## 2023-03-26 MED ORDER — OXYCODONE-ACETAMINOPHEN 5-325 MG PO TABS
1.0000 | ORAL_TABLET | ORAL | Status: DC | PRN
  Administered 2023-03-26: 2 via ORAL
  Filled 2023-03-26: qty 2

## 2023-03-26 MED ORDER — POLYETHYLENE GLYCOL 3350 17 G PO PACK
17.0000 g | PACK | Freq: Every day | ORAL | Status: DC
  Administered 2023-03-26: 17 g via ORAL
  Filled 2023-03-26: qty 1

## 2023-03-26 MED ORDER — OXYCODONE-ACETAMINOPHEN 5-325 MG PO TABS
1.0000 | ORAL_TABLET | ORAL | Status: DC | PRN
  Administered 2023-03-27: 1 via ORAL
  Filled 2023-03-26 (×2): qty 1

## 2023-03-26 MED ORDER — SENNA 8.6 MG PO TABS: 1.0000 | ORAL_TABLET | Freq: Every day | ORAL | Status: DC

## 2023-03-26 MED ORDER — OXYCODONE-ACETAMINOPHEN 5-325 MG PO TABS: 1.0000 | ORAL_TABLET | Freq: Three times a day (TID) | ORAL | Status: DC | PRN

## 2023-03-26 MED ORDER — OXYCODONE HCL 5 MG PO TABS
5.0000 mg | ORAL_TABLET | ORAL | Status: DC | PRN
  Administered 2023-03-27: 5 mg via ORAL
  Filled 2023-03-26 (×2): qty 1

## 2023-03-26 MED ORDER — OXYCODONE HCL 5 MG PO TABS: 5.0000 mg | ORAL_TABLET | Freq: Three times a day (TID) | ORAL | Status: DC | PRN

## 2023-03-26 NOTE — Progress Notes (Signed)
[facility] Progress Note  Patient: Natalie Griffin     ZOX:[id number]  DOA: 03/24/2023   PCP: Charlane Ferretti, DO       Brief hospital course: This is a 47 year old female with breast cancer initially diagnosed in 2021, now metastatic with metastasis to left femur who is receiving palliative radiation to this area along with chemotherapy and anastrozole.  When diagnosed in 9/21, she underwent a left lumpectomy which showed clear margins and then refused adjuvant chemo, adjuvant radiation and adjuvant antiestrogen therapy.  Recently the patient had increasing pain in her back which was uncontrolled with oral narcotics at home and therefore presented to the hospital.  Subjective:  Pain has improved and she feels the MS Contin and Robaxin have help but Oxycodone 5 mg did not help her breakthrough pain today.   She remains constipated today and would like to try prune juice. We have extensively discussed the side effects of narcotics and the need for laxatives and possibly enemas as long as she remains on narcotics.  .  Assessment and Plan: Principal Problem:   Cancer associated pain -Reviewed L-spine MRI which reveals widespread osseous metastatic disease, pathological compression fractures which are unchanged in L2, L4 and T12.  L2 compression has increased in height from 20 to 35%.  She has epidural extension of tumor, narrowing of the right lateral recess posterior to the L4 vertebral body which could affect the descending L4 nerve root, L4-L5 foraminal narrowing which is mild and possible impingement of the right extraforaminal L5 nerve. The patient's pain is just lateral to L2-L3 and on the left and is exacerbated by movement - MS contin increased to 60 mg BID is helping - she is still using Oxycodone and IV Dilaudid for breakthrough pain - will increase dose of Oxycodone and increase the frequency to Q 4 hrs. - Will continue Robaxin but switch to oral - I have reviewed the  patients C, T  spine MRIs that were performed yesterday with the patient today- she has numerous areas with vertebral mets and compression fractures - Dr Kathrynn Running plans to begin radiation on her L spine on Monday  Active Problems: -  She feels that just taking prune juice will help today but I have encouraged her to at least start Senna today - If she does not have a BM by tomorrow, she is will to try more aggressive measures    Malignant neoplasm of upper-inner quadrant of left breast in female, estrogen receptor positive (HCC) -Follows with Dr. Pamelia Hoit -Continue anastrozole       Code Status: DNR Consultants: Radiation oncology Level of Care: Level of care: Med-Surg Total time on patient care: 35 minutes DVT prophylaxis:  SCDs Start: 03/24/23 2300     Objective:   Vitals:   03/25/23 1413 03/25/23 2009 03/26/23 0624 03/26/23 1303  BP: (!) 93/47 117/69 98/63 95/82   Pulse: (!) 57 78 61 (!) 57  Resp: 17 16  15   Temp: 98.5 F (36.9 C) 97.8 F (36.6 C) 98.6 F (37 C) 98.8 F (37.1 C)  TempSrc: Oral Oral Oral Oral  SpO2: 100% 98% 98%   Weight:      Height:       Filed Weights   03/24/23 1157  Weight: 69 kg   Exam: General exam: Appears comfortable  HEENT: oral mucosa moist Respiratory system: Clear to auscultation.  Cardiovascular system: S1 & S2 heard  Gastrointestinal system: Abdomen soft, non-tender, nondistended. Normal bowel sounds   Extremities: No cyanosis, clubbing  or edema Psychiatry:  Mood & affect appropriate.     CBC: Recent Labs  Lab 03/24/23 1349 03/25/23 0044  WBC 11.0* 8.3  NEUTROABS 7.4  --   HGB 12.2 10.7*  HCT 39.0 34.2*  MCV 88.4 89.3  PLT 391 330    Basic Metabolic Panel: Recent Labs  Lab 03/24/23 1349 03/25/23 0044  NA 137 135  K 3.9 3.9  CL 100 101  CO2 23 22  GLUCOSE 113* 103*  BUN 10 12  CREATININE 0.77 0.82  CALCIUM 10.4* 9.9  MG 2.1 2.1  PHOS 5.5* 5.9*    GFR: Estimated Creatinine Clearance: 81.9 mL/min (by C-G  formula based on SCr of 0.82 mg/dL).  Scheduled Meds:  anastrozole  1 mg Oral Daily   ascorbic acid  500 mg Oral Daily   docusate sodium  100 mg Oral BID   morphine  60 mg Oral Q12H   polyethylene glycol  17 g Oral Daily   senna  1 tablet Oral QHS   Continuous Infusions:   Imaging and lab data was personally reviewed MR Cervical Spine W and Wo Contrast  Result Date: 03/25/2023 CLINICAL DATA:  Osseous metastatic disease. EXAM: MRI CERVICAL AND THORACIC SPINE WITHOUT AND WITH CONTRAST TECHNIQUE: Multiplanar and multiecho pulse sequences of the cervical spine, to include the craniocervical junction and cervicothoracic junction, and the thoracic spine, were obtained without and with intravenous contrast. CONTRAST:  7mL GADAVIST GADOBUTROL 1 MMOL/ML IV SOLN COMPARISON:  Thoracic spine MRI 12/29/2022 FINDINGS: MRI CERVICAL SPINE FINDINGS Alignment: Normal. Vertebrae: There is widespread osseous metastatic disease in the cervical spine and skull base most notably involving the left occipital condyle, bilateral C1 lateral masses and posterior C1 ring, C2 vertebral body and posterior elements, and C4 vertebral body and left posterior elements. There is no epidural tumor. Cord: Normal in signal and morphology without abnormal enhancement. Posterior Fossa, vertebral arteries, paraspinal tissues: Imaged posterior fossa is unremarkable. The vertebral artery flow voids are normal. The paraspinal soft tissues are unremarkable. Disc levels: C2-C3: Mild facet arthropathy without significant spinal canal or neural foraminal stenosis C3-C4: There is a shallow posterior disc osteophyte complex and mild bilateral facet arthropathy resulting in mild right and no significant left neural foraminal stenosis and no significant spinal canal stenosis C4-C5: Mild facet arthropathy without significant spinal canal or neural foraminal stenosis. C5-C6: No significant spinal canal or neural foraminal stenosis. C6-C7: There is a  shallow disc protrusion and mild facet arthropathy with ligamentum flavum thickening resulting in mild spinal canal stenosis and mild left and no significant right neural foraminal stenosis C7-T1: Mild facet arthropathy resulting in mild right and no significant left neural foraminal stenosis and no significant spinal canal stenosis. MRI THORACIC SPINE FINDINGS Alignment:  Normal. Vertebrae: Again seen is widespread osseous metastatic disease throughout the thoracic spine involving essentially every level, most extensive at T8 through T12 where there is also extensive involvement of the posterior elements. Pathologic compression deformity of the T4, T5, T10, and T12 vertebral bodies is again seen. The loss of height at T10 has progressed since the prior MRI from 12/29/2022, now with up to approximately 50% loss of vertebral body height. The other vertebral body height loss is similar to the prior study. There is a small amount of epidural tumor along the right aspect of the posterior T8 endplate (20-22). There is a more prominent epidural tumor measuring up to proximally 5 mm in thickness along the left aspect of the posterior T10 through L1 endplates, most  pronounced at T10 and T11 (23-11, 23-8, 24-28, 24-31). This epidural extension of tumor appears progressed since the prior MRI from 12/29/2022. The tumor resulting in no greater than mild spinal canal stenosis, without cord compression. Cord: Normal in signal and morphology without abnormal enhancement. As above, there is no cord compression. Paraspinal and other soft tissues: Previously seen edema in the paraspinal soft tissues at T9 through T11 is no longer seen. Disc levels: There is mild underlying degenerative change of the thoracic spine without high-grade spinal canal or neural foraminal stenosis. IMPRESSION: 1. Widespread osseous metastatic disease throughout the imaged skull base, and cervical and thoracic spine. 2. Pathologic compression deformity of  the T10 vertebral body has progressed since the prior MRI from 12/29/2022, now with up to approximately 50% loss of vertebral body height. Compression deformity of the T4, T5, and T12 vertebral bodies is similar to the prior study. 3. Epidural extension of tumor at T8 through L1 has progressed since the prior MRI but without significant spinal canal stenosis or cord compression. 4. No epidural tumor in the cervical spine. 5. Previously seen paraspinal soft tissue edema at T9 through T11 is no longer seen. Electronically Signed   By: Lesia Hausen M.D.   On: 03/25/2023 18:49   MR THORACIC SPINE W WO CONTRAST  Result Date: 03/25/2023 CLINICAL DATA:  Osseous metastatic disease. EXAM: MRI CERVICAL AND THORACIC SPINE WITHOUT AND WITH CONTRAST TECHNIQUE: Multiplanar and multiecho pulse sequences of the cervical spine, to include the craniocervical junction and cervicothoracic junction, and the thoracic spine, were obtained without and with intravenous contrast. CONTRAST:  7mL GADAVIST GADOBUTROL 1 MMOL/ML IV SOLN COMPARISON:  Thoracic spine MRI 12/29/2022 FINDINGS: MRI CERVICAL SPINE FINDINGS Alignment: Normal. Vertebrae: There is widespread osseous metastatic disease in the cervical spine and skull base most notably involving the left occipital condyle, bilateral C1 lateral masses and posterior C1 ring, C2 vertebral body and posterior elements, and C4 vertebral body and left posterior elements. There is no epidural tumor. Cord: Normal in signal and morphology without abnormal enhancement. Posterior Fossa, vertebral arteries, paraspinal tissues: Imaged posterior fossa is unremarkable. The vertebral artery flow voids are normal. The paraspinal soft tissues are unremarkable. Disc levels: C2-C3: Mild facet arthropathy without significant spinal canal or neural foraminal stenosis C3-C4: There is a shallow posterior disc osteophyte complex and mild bilateral facet arthropathy resulting in mild right and no significant left  neural foraminal stenosis and no significant spinal canal stenosis C4-C5: Mild facet arthropathy without significant spinal canal or neural foraminal stenosis. C5-C6: No significant spinal canal or neural foraminal stenosis. C6-C7: There is a shallow disc protrusion and mild facet arthropathy with ligamentum flavum thickening resulting in mild spinal canal stenosis and mild left and no significant right neural foraminal stenosis C7-T1: Mild facet arthropathy resulting in mild right and no significant left neural foraminal stenosis and no significant spinal canal stenosis. MRI THORACIC SPINE FINDINGS Alignment:  Normal. Vertebrae: Again seen is widespread osseous metastatic disease throughout the thoracic spine involving essentially every level, most extensive at T8 through T12 where there is also extensive involvement of the posterior elements. Pathologic compression deformity of the T4, T5, T10, and T12 vertebral bodies is again seen. The loss of height at T10 has progressed since the prior MRI from 12/29/2022, now with up to approximately 50% loss of vertebral body height. The other vertebral body height loss is similar to the prior study. There is a small amount of epidural tumor along the right aspect of the posterior  T8 endplate (20-22). There is a more prominent epidural tumor measuring up to proximally 5 mm in thickness along the left aspect of the posterior T10 through L1 endplates, most pronounced at T10 and T11 (23-11, 23-8, 24-28, 24-31). This epidural extension of tumor appears progressed since the prior MRI from 12/29/2022. The tumor resulting in no greater than mild spinal canal stenosis, without cord compression. Cord: Normal in signal and morphology without abnormal enhancement. As above, there is no cord compression. Paraspinal and other soft tissues: Previously seen edema in the paraspinal soft tissues at T9 through T11 is no longer seen. Disc levels: There is mild underlying degenerative change of  the thoracic spine without high-grade spinal canal or neural foraminal stenosis. IMPRESSION: 1. Widespread osseous metastatic disease throughout the imaged skull base, and cervical and thoracic spine. 2. Pathologic compression deformity of the T10 vertebral body has progressed since the prior MRI from 12/29/2022, now with up to approximately 50% loss of vertebral body height. Compression deformity of the T4, T5, and T12 vertebral bodies is similar to the prior study. 3. Epidural extension of tumor at T8 through L1 has progressed since the prior MRI but without significant spinal canal stenosis or cord compression. 4. No epidural tumor in the cervical spine. 5. Previously seen paraspinal soft tissue edema at T9 through T11 is no longer seen. Electronically Signed   By: Lesia Hausen M.D.   On: 03/25/2023 18:49   MR Lumbar Spine W Wo Contrast  Result Date: 03/24/2023 CLINICAL DATA:  Back spasms, lower left back pain, history of breast cancer with bone Mets EXAM: MRI LUMBAR SPINE WITHOUT AND WITH CONTRAST TECHNIQUE: Multiplanar and multiecho pulse sequences of the lumbar spine were obtained without and with intravenous contrast. CONTRAST:  7mL GADAVIST GADOBUTROL 1 MMOL/ML IV SOLN COMPARISON:  12/29/2022 MRI lumbar spine and 03/24/2023 CT lumbar spine FINDINGS: Segmentation:  5 lumbar type vertebral bodies. Alignment: No listhesis. Preservation of the normal lumbar lordosis. Vertebrae: Redemonstrated widespread osseous metastatic disease throughout the imaged lumbar spine and sacrum. Pathologic fracture at L2, with similar appearance of the superior endplate and increased inferior endplate fracture (series 10, image 10), now up to 35% height loss, previously up to 20%. Increased tumor at the posterior aspects of T12, eccentric to the right and in both pedicles, L1, eccentric to the right and in the right pedicle, L2, eccentric to the left, and L3, increased in the left pedicle, narrowing the spinal canal. Unchanged  posterior extension, eccentric to the left, at L4. Epidural extension is also noted at T11, although this level is incompletely evaluated but likely increased from prior exam. Redemonstrated pathologic compression fractures involving superior endplates of T12 and L4. no evidence of discitis. Conus medullaris and cauda equina: Conus extends to the T12-L1 level. Conus and cauda equina appear normal. No abnormal enhancement. Paraspinal and other soft tissues: Edema in the medial left psoas. No lymphadenopathy. Disc levels: T12-L1: No significant disc bulge. No spinal canal stenosis or neural foraminal narrowing. L1-L2: No significant disc bulge. No spinal canal stenosis or neural foraminal narrowing. L2-L3: No significant disc bulge. Narrowing of the left aspect of the spinal canal due to osseous metastatic disease. No spinal canal stenosis or neural foraminal narrowing. L3-L4: No significant disc bulge. No spinal canal stenosis or neural foraminal narrowing. Narrowing of the left greater than right lateral recess posterior to the L4 vertebral body (series 8, image 26), which could affect the descending L4 nerve roots. L4-L5: No significant disc bulge. No spinal canal  stenosis. Mild right neural foraminal narrowing, secondary to osseous metastatic disease in the right pedicle of L4. L5-S1: No significant disc bulge. No spinal canal stenosis or neural foraminal narrowing. Possible impingement of the right extraforaminal L5 nerve secondary to osseous metastatic hypertrophy. IMPRESSION: 1. Redemonstrated widespread osseous metastatic disease, with unchanged pathologic compression fractures at T12 and L4 and increased height loss at L2, now up to 35%, previously 20%. 2. Multilevel epidural extension of tumor. Epidural extension at T11 is incompletely evaluated but appears increased from the prior exam. 3. Narrowing of the left greater than right lateral recess posterior to the L4 vertebral body, secondary to epidural  extension, which could affect the descending L4 nerve roots. 4. L4-L5 mild right neural foraminal narrowing, secondary to osseous metastatic disease in the right pedicle of L4. 5. Possible impingement of the right extraforaminal L5 nerve secondary to osseous metastatic hypertrophy. Electronically Signed   By: Wiliam Ke M.D.   On: 03/24/2023 18:14    LOS: 2 days   Author: Calvert Cantor  03/26/2023 3:17 PM  To contact [facility]>   Check the care team in [facility] and look for the attending/consulting [facility] provider listed  Log into [email or web address] and use [facility]'s universal password   Go to> "[facility]"  and find provider  If you still have difficulty reaching the provider, please page the [facility] (Director on Call) for the Hospitalists listed on amion

## 2023-03-26 NOTE — Progress Notes (Signed)
Chaplain assisted Natalie Griffin in writing her wishes for medical care and assign her sister as her HCPOA.  She is very clear about her decisions of not wanting to be resuscitated. Documents were notarized and a copy was sent to [facility] documents and another placed in her paper chart.  The original was returned to her.  [address] B[facility] Pager, [phone]

## 2023-03-27 ENCOUNTER — Other Ambulatory Visit (HOSPITAL_COMMUNITY): Payer: Self-pay

## 2023-03-27 DIAGNOSIS — G893 Neoplasm related pain (acute) (chronic): Secondary | ICD-10-CM | POA: Diagnosis not present

## 2023-03-27 MED ORDER — SENNA 8.6 MG PO TABS
1.0000 | ORAL_TABLET | Freq: Every day | ORAL | 0 refills | Status: DC | PRN
  Filled 2023-03-27: qty 120, 120d supply, fill #0

## 2023-03-27 MED ORDER — NAPROXEN 500 MG PO TABS: 500.0000 mg | ORAL_TABLET | Freq: Two times a day (BID) | ORAL | Status: DC

## 2023-03-27 MED ORDER — METHOCARBAMOL 500 MG PO TABS: 500.0000 mg | ORAL_TABLET | Freq: Four times a day (QID) | ORAL | 0 refills | Status: DC | PRN

## 2023-03-27 MED ORDER — ONDANSETRON HCL 4 MG PO TABS
4.0000 mg | ORAL_TABLET | Freq: Four times a day (QID) | ORAL | 0 refills | Status: DC | PRN
  Filled 2023-03-27: qty 30, 8d supply, fill #0

## 2023-03-27 MED ORDER — METHOCARBAMOL 500 MG PO TABS
500.0000 mg | ORAL_TABLET | Freq: Four times a day (QID) | ORAL | 0 refills | Status: DC | PRN
  Filled 2023-03-27: qty 120, 30d supply, fill #0

## 2023-03-27 MED ORDER — POLYETHYLENE GLYCOL 3350 17 G PO PACK
17.0000 g | PACK | Freq: Every day | ORAL | 0 refills | Status: DC | PRN
  Filled 2023-03-27: qty 14, 14d supply, fill #0

## 2023-03-27 MED ORDER — NAPROXEN 500 MG PO TABS
500.0000 mg | ORAL_TABLET | Freq: Two times a day (BID) | ORAL | 0 refills | Status: DC
  Filled 2023-03-27: qty 60, 30d supply, fill #0

## 2023-03-27 MED ORDER — HYDROMORPHONE HCL 2 MG PO TABS
2.0000 mg | ORAL_TABLET | ORAL | 0 refills | Status: DC | PRN
  Filled 2023-03-27: qty 60, 5d supply, fill #0

## 2023-03-27 MED ORDER — ACETAMINOPHEN 325 MG PO TABS: 650.0000 mg | ORAL_TABLET | Freq: Four times a day (QID) | ORAL | Status: DC | PRN

## 2023-03-27 MED ORDER — HYDROMORPHONE HCL 2 MG PO TABS
2.0000 mg | ORAL_TABLET | ORAL | Status: DC | PRN
  Administered 2023-03-27 (×2): 2 mg via ORAL
  Filled 2023-03-27 (×2): qty 1

## 2023-03-27 MED ORDER — HYDROMORPHONE HCL 2 MG PO TABS: 2.0000 mg | ORAL_TABLET | ORAL | Status: DC | PRN

## 2023-03-27 MED ORDER — MORPHINE SULFATE ER 30 MG PO TBCR
60.0000 mg | EXTENDED_RELEASE_TABLET | Freq: Two times a day (BID) | ORAL | 0 refills | Status: DC
  Filled 2023-03-27: qty 60, 15d supply, fill #0
  Filled 2023-03-30 – 2023-04-06 (×2): qty 60, 20d supply, fill #0

## 2023-03-27 NOTE — Discharge Summary (Addendum)
Physician Discharge Summary  Natalie Griffin ZOX:[id number] DOB: 13-Mar-1976 DOA: 03/24/2023  PCP: Charlane Ferretti, DO  Admit date: 03/24/2023 Discharge date: 03/27/2023 Discharging to: home     Discharge Diagnoses:   Principal Problem:   Cancer associated pain Active Problems:   Malignant neoplasm of upper-inner quadrant of left breast in female, estrogen receptor positive (HCC)   Pathological fracture of right hip (HCC)   Metastatic cancer to spine [facility] ([place]     Brief hospital course: This is a 47 year old female with breast cancer initially diagnosed in 2021, now metastatic with metastasis to left femur who is receiving palliative radiation to this area along with chemotherapy and anastrozole.  When diagnosed in 9/21, she underwent a left lumpectomy which showed clear margins and then refused adjuvant chemo, adjuvant radiation and adjuvant antiestrogen therapy.   Recently the patient had increasing pain in her back which was uncontrolled with oral narcotics at home and therefore presented to the hospital.   Subjective:  Patient had nausea and vomiting today.  She was constipated yesterday.  Had a number of bowel movements yesterday evening.  She feels that her pain is worse ever since the vomiting and mostly present in her mid back, flanks and sides of her abdomen.  Oxycodone is making her feel extremely dizzy and "loopy" and nauseated. .  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 Pamelia Hoit -Continue anastrozole - The patient's pain is just lateral to upper L-spine and on the left and is exacerbated by movement - I have reviewed the patients C, T  spine MRIs- she has numerous areas with vertebral mets and compression fractures -Have consulted and spoken with rad onc-Dr Kathrynn Running plans to begin radiation on her L spine on Monday - MS  contin increased to 60 mg BID is helping - Oxycodone, Robxin and Naproxen added -I have started Dilaudid orally today and DC'd oxycodone - she is tolerating this change well and is asking to be discharged   Active Problems:  Nausea and vomiting - singe episode today-has since resolved- she is tolerating orals   Constipation - Has resolved        Discharge Instructions   Allergies as of 03/27/2023       Reactions   Adhesive [tape] Other (See Comments)   Blistering with steri strips   Fentanyl Itching, Nausea Only, Rash   08/11/2022 reports removing patch two days ago. Experienced "rash, itching, nausea, headache and breathing" affected.      Phesgo [[id number]] Diarrhea, Rash   Phesgo side effect: Fixed drug eruption, profound diarrhea Therefore we will discontinue Phesgo and start her on Herceptin Hylecta Inj   Tetanus Toxoid Anaphylaxis   Succinylcholine Other (See Comments)   Her Body does not have enough enzymes to carry it out of her body        Medication List     STOP taking these medications    oxyCODONE-acetaminophen 10-325 MG tablet Commonly known as: Percocet       TAKE these medications    acetaminophen 325 MG tablet Commonly known as: TYLENOL Take 2 tablets (650 mg total) by mouth every 6 (six) hours as needed for mild pain (or Fever >/= 101).   ALPRAZolam 0.5 MG tablet Commonly known as: XANAX Take 1 tablet (0.5 mg total) by mouth 3 (three) times daily as needed for anxiety.   amphetamine-dextroamphetamine 10 MG  tablet Commonly known as: Adderall Take 0.5-1 tablets (5-10 mg total) by mouth 2 (two) times daily as needed.   anastrozole 1 MG tablet Commonly known as: ARIMIDEX Take 1 tablet (1 mg total) by mouth daily.   ascorbic acid 500 MG tablet Commonly known as: VITAMIN C Take 500 mg by mouth daily.   Calcium 200 MG Tabs 300 mg.   HYDROmorphone 2 MG tablet Commonly known as: DILAUDID Take 1-2 tablets (2-4 mg total)  by mouth every 4 (four) hours as needed for severe pain.   methocarbamol 500 MG tablet Commonly known as: ROBAXIN Take 1-2 tablets (500-1,000 mg total) by mouth every 6 (six) hours as needed for muscle spasms.   morphine 30 MG 12 hr tablet Commonly known as: MS CONTIN Take 2 tablets (60 mg total) by mouth every 12 (twelve) hours. What changed: how much to take   MULTIVITAMIN ADULT PO Multivitamin   naproxen 500 MG tablet Commonly known as: NAPROSYN Take 1 tablet (500 mg total) by mouth 2 (two) times daily with a meal.   ondansetron 4 MG tablet Commonly known as: ZOFRAN Take 1 tablet (4 mg total) by mouth every 6 (six) hours as needed for nausea.   polyethylene glycol 17 g packet Commonly known as: MIRALAX / GLYCOLAX Take 17 g by mouth daily as needed.   senna 8.6 MG Tabs tablet Commonly known as: SENOKOT Take 1 tablet (8.6 mg total) by mouth daily as needed for mild constipation.   Slow Fe 142 (45 Fe) MG Tbcr 1 tablet Orally Three times a Week   Vitamin D3 50 MCG (2000 UT) capsule Vitamin D3            The results of significant diagnostics from this hospitalization (including imaging, microbiology, ancillary and laboratory) are listed below for reference.    MR Cervical Spine W and Wo Contrast  Result Date: 03/25/2023 CLINICAL DATA:  Osseous metastatic disease. EXAM: MRI CERVICAL AND THORACIC SPINE WITHOUT AND WITH CONTRAST TECHNIQUE: Multiplanar and multiecho pulse sequences of the cervical spine, to include the craniocervical junction and cervicothoracic junction, and the thoracic spine, were obtained without and with intravenous contrast. CONTRAST:  7mL GADAVIST GADOBUTROL 1 MMOL/ML IV SOLN COMPARISON:  Thoracic spine MRI 12/29/2022 FINDINGS: MRI CERVICAL SPINE FINDINGS Alignment: Normal. Vertebrae: There is widespread osseous metastatic disease in the cervical spine and skull base most notably involving the left occipital condyle, bilateral C1 lateral masses and  posterior C1 ring, C2 vertebral body and posterior elements, and C4 vertebral body and left posterior elements. There is no epidural tumor. Cord: Normal in signal and morphology without abnormal enhancement. Posterior Fossa, vertebral arteries, paraspinal tissues: Imaged posterior fossa is unremarkable. The vertebral artery flow voids are normal. The paraspinal soft tissues are unremarkable. Disc levels: C2-C3: Mild facet arthropathy without significant spinal canal or neural foraminal stenosis C3-C4: There is a shallow posterior disc osteophyte complex and mild bilateral facet arthropathy resulting in mild right and no significant left neural foraminal stenosis and no significant spinal canal stenosis C4-C5: Mild facet arthropathy without significant spinal canal or neural foraminal stenosis. C5-C6: No significant spinal canal or neural foraminal stenosis. C6-C7: There is a shallow disc protrusion and mild facet arthropathy with ligamentum flavum thickening resulting in mild spinal canal stenosis and mild left and no significant right neural foraminal stenosis C7-T1: Mild facet arthropathy resulting in mild right and no significant left neural foraminal stenosis and no significant spinal canal stenosis. MRI THORACIC SPINE FINDINGS Alignment:  Normal. Vertebrae: Again  seen is widespread osseous metastatic disease throughout the thoracic spine involving essentially every level, most extensive at T8 through T12 where there is also extensive involvement of the posterior elements. Pathologic compression deformity of the T4, T5, T10, and T12 vertebral bodies is again seen. The loss of height at T10 has progressed since the prior MRI from 12/29/2022, now with up to approximately 50% loss of vertebral body height. The other vertebral body height loss is similar to the prior study. There is a small amount of epidural tumor along the right aspect of the posterior T8 endplate (20-22). There is a more prominent epidural tumor  measuring up to proximally 5 mm in thickness along the left aspect of the posterior T10 through L1 endplates, most pronounced at T10 and T11 (23-11, 23-8, 24-28, 24-31). This epidural extension of tumor appears progressed since the prior MRI from 12/29/2022. The tumor resulting in no greater than mild spinal canal stenosis, without cord compression. Cord: Normal in signal and morphology without abnormal enhancement. As above, there is no cord compression. Paraspinal and other soft tissues: Previously seen edema in the paraspinal soft tissues at T9 through T11 is no longer seen. Disc levels: There is mild underlying degenerative change of the thoracic spine without high-grade spinal canal or neural foraminal stenosis. IMPRESSION: 1. Widespread osseous metastatic disease throughout the imaged skull base, and cervical and thoracic spine. 2. Pathologic compression deformity of the T10 vertebral body has progressed since the prior MRI from 12/29/2022, now with up to approximately 50% loss of vertebral body height. Compression deformity of the T4, T5, and T12 vertebral bodies is similar to the prior study. 3. Epidural extension of tumor at T8 through L1 has progressed since the prior MRI but without significant spinal canal stenosis or cord compression. 4. No epidural tumor in the cervical spine. 5. Previously seen paraspinal soft tissue edema at T9 through T11 is no longer seen. Electronically Signed   By: Lesia Hausen M.D.   On: 03/25/2023 18:49   MR THORACIC SPINE W WO CONTRAST  Result Date: 03/25/2023 CLINICAL DATA:  Osseous metastatic disease. EXAM: MRI CERVICAL AND THORACIC SPINE WITHOUT AND WITH CONTRAST TECHNIQUE: Multiplanar and multiecho pulse sequences of the cervical spine, to include the craniocervical junction and cervicothoracic junction, and the thoracic spine, were obtained without and with intravenous contrast. CONTRAST:  7mL GADAVIST GADOBUTROL 1 MMOL/ML IV SOLN COMPARISON:  Thoracic spine MRI  12/29/2022 FINDINGS: MRI CERVICAL SPINE FINDINGS Alignment: Normal. Vertebrae: There is widespread osseous metastatic disease in the cervical spine and skull base most notably involving the left occipital condyle, bilateral C1 lateral masses and posterior C1 ring, C2 vertebral body and posterior elements, and C4 vertebral body and left posterior elements. There is no epidural tumor. Cord: Normal in signal and morphology without abnormal enhancement. Posterior Fossa, vertebral arteries, paraspinal tissues: Imaged posterior fossa is unremarkable. The vertebral artery flow voids are normal. The paraspinal soft tissues are unremarkable. Disc levels: C2-C3: Mild facet arthropathy without significant spinal canal or neural foraminal stenosis C3-C4: There is a shallow posterior disc osteophyte complex and mild bilateral facet arthropathy resulting in mild right and no significant left neural foraminal stenosis and no significant spinal canal stenosis C4-C5: Mild facet arthropathy without significant spinal canal or neural foraminal stenosis. C5-C6: No significant spinal canal or neural foraminal stenosis. C6-C7: There is a shallow disc protrusion and mild facet arthropathy with ligamentum flavum thickening resulting in mild spinal canal stenosis and mild left and no significant right neural foraminal stenosis  C7-T1: Mild facet arthropathy resulting in mild right and no significant left neural foraminal stenosis and no significant spinal canal stenosis. MRI THORACIC SPINE FINDINGS Alignment:  Normal. Vertebrae: Again seen is widespread osseous metastatic disease throughout the thoracic spine involving essentially every level, most extensive at T8 through T12 where there is also extensive involvement of the posterior elements. Pathologic compression deformity of the T4, T5, T10, and T12 vertebral bodies is again seen. The loss of height at T10 has progressed since the prior MRI from 12/29/2022, now with up to approximately  50% loss of vertebral body height. The other vertebral body height loss is similar to the prior study. There is a small amount of epidural tumor along the right aspect of the posterior T8 endplate (20-22). There is a more prominent epidural tumor measuring up to proximally 5 mm in thickness along the left aspect of the posterior T10 through L1 endplates, most pronounced at T10 and T11 (23-11, 23-8, 24-28, 24-31). This epidural extension of tumor appears progressed since the prior MRI from 12/29/2022. The tumor resulting in no greater than mild spinal canal stenosis, without cord compression. Cord: Normal in signal and morphology without abnormal enhancement. As above, there is no cord compression. Paraspinal and other soft tissues: Previously seen edema in the paraspinal soft tissues at T9 through T11 is no longer seen. Disc levels: There is mild underlying degenerative change of the thoracic spine without high-grade spinal canal or neural foraminal stenosis. IMPRESSION: 1. Widespread osseous metastatic disease throughout the imaged skull base, and cervical and thoracic spine. 2. Pathologic compression deformity of the T10 vertebral body has progressed since the prior MRI from 12/29/2022, now with up to approximately 50% loss of vertebral body height. Compression deformity of the T4, T5, and T12 vertebral bodies is similar to the prior study. 3. Epidural extension of tumor at T8 through L1 has progressed since the prior MRI but without significant spinal canal stenosis or cord compression. 4. No epidural tumor in the cervical spine. 5. Previously seen paraspinal soft tissue edema at T9 through T11 is no longer seen. Electronically Signed   By: Lesia Hausen M.D.   On: 03/25/2023 18:49   MR Lumbar Spine W Wo Contrast  Result Date: 03/24/2023 CLINICAL DATA:  Back spasms, lower left back pain, history of breast cancer with bone Mets EXAM: MRI LUMBAR SPINE WITHOUT AND WITH CONTRAST TECHNIQUE: Multiplanar and multiecho  pulse sequences of the lumbar spine were obtained without and with intravenous contrast. CONTRAST:  7mL GADAVIST GADOBUTROL 1 MMOL/ML IV SOLN COMPARISON:  12/29/2022 MRI lumbar spine and 03/24/2023 CT lumbar spine FINDINGS: Segmentation:  5 lumbar type vertebral bodies. Alignment: No listhesis. Preservation of the normal lumbar lordosis. Vertebrae: Redemonstrated widespread osseous metastatic disease throughout the imaged lumbar spine and sacrum. Pathologic fracture at L2, with similar appearance of the superior endplate and increased inferior endplate fracture (series 10, image 10), now up to 35% height loss, previously up to 20%. Increased tumor at the posterior aspects of T12, eccentric to the right and in both pedicles, L1, eccentric to the right and in the right pedicle, L2, eccentric to the left, and L3, increased in the left pedicle, narrowing the spinal canal. Unchanged posterior extension, eccentric to the left, at L4. Epidural extension is also noted at T11, although this level is incompletely evaluated but likely increased from prior exam. Redemonstrated pathologic compression fractures involving superior endplates of T12 and L4. no evidence of discitis. Conus medullaris and cauda equina: Conus extends to the  T12-L1 level. Conus and cauda equina appear normal. No abnormal enhancement. Paraspinal and other soft tissues: Edema in the medial left psoas. No lymphadenopathy. Disc levels: T12-L1: No significant disc bulge. No spinal canal stenosis or neural foraminal narrowing. L1-L2: No significant disc bulge. No spinal canal stenosis or neural foraminal narrowing. L2-L3: No significant disc bulge. Narrowing of the left aspect of the spinal canal due to osseous metastatic disease. No spinal canal stenosis or neural foraminal narrowing. L3-L4: No significant disc bulge. No spinal canal stenosis or neural foraminal narrowing. Narrowing of the left greater than right lateral recess posterior to the L4 vertebral  body (series 8, image 26), which could affect the descending L4 nerve roots. L4-L5: No significant disc bulge. No spinal canal stenosis. Mild right neural foraminal narrowing, secondary to osseous metastatic disease in the right pedicle of L4. L5-S1: No significant disc bulge. No spinal canal stenosis or neural foraminal narrowing. Possible impingement of the right extraforaminal L5 nerve secondary to osseous metastatic hypertrophy. IMPRESSION: 1. Redemonstrated widespread osseous metastatic disease, with unchanged pathologic compression fractures at T12 and L4 and increased height loss at L2, now up to 35%, previously 20%. 2. Multilevel epidural extension of tumor. Epidural extension at T11 is incompletely evaluated but appears increased from the prior exam. 3. Narrowing of the left greater than right lateral recess posterior to the L4 vertebral body, secondary to epidural extension, which could affect the descending L4 nerve roots. 4. L4-L5 mild right neural foraminal narrowing, secondary to osseous metastatic disease in the right pedicle of L4. 5. Possible impingement of the right extraforaminal L5 nerve secondary to osseous metastatic hypertrophy. Electronically Signed   By: Wiliam Ke M.D.   On: 03/24/2023 18:14   CT L-SPINE NO CHARGE  Result Date: 03/24/2023 CLINICAL DATA:  Worsening left low back pain. Widespread metastatic breast cancer. EXAM: CT LUMBAR SPINE WITHOUT CONTRAST TECHNIQUE: Multidetector CT imaging of the lumbar spine was performed without intravenous contrast administration. Multiplanar CT image reconstructions were also generated. RADIATION DOSE REDUCTION: This exam was performed according to the departmental dose-optimization program which includes automated exposure control, adjustment of the mA and/or kV according to patient size and/or use of iterative reconstruction technique. COMPARISON:  MRI 12/29/2022.  CT 12/29/2022. FINDINGS: Segmentation: 5 lumbar type vertebral bodies as  numbered previously. Alignment: No malalignment. Vertebrae: Widespread lytic and sclerotic metastatic disease throughout the lumbar spine and sacrum, markedly progressive since 3 months ago. Only the inferior aspect of T10 is included on the study. It appears that there may be a worsened compression fracture at that level. Minor loss of height at T12 is stable. Minor loss of height on the left at L2 is stable. Superior endplate Schmorl's node type deformity at L4 is not significantly changed. Question developing epidural tumor in the ventral epidural space at L3 and L4, with extension towards the foramen on the left at L4-5. Consider MRI for better evaluation. Additionally, the progressive sacral disease could be encroaching upon the sacral neural foramina. Paraspinal and other soft tissues: See results of abdominal CT. Disc levels: No evidence of actual disc pathology. See above discussion regarding metastatic disease. IMPRESSION: 1. Widespread lytic and sclerotic metastatic disease throughout the lumbar spine and sacrum, markedly progressive since 3 months ago. Only the inferior aspect of T10 is included on the study, but it appears that there may be a worsened compression fracture at that level. Other minor compression deformities have not progressed. 2. Question developing epidural tumor in the ventral epidural space at L3  and L4, with extension towards the foramen on the left at L4-5. Consider MRI for better evaluation. 3. Additionally, the progressive sacral disease could be encroaching upon the sacral neural foramina. Electronically Signed   By: Paulina Fusi M.D.   On: 03/24/2023 15:09   CT Renal Stone Study  Result Date: 03/24/2023 CLINICAL DATA:  Abdominal and flank pain. Stone disease suspected. Left lower back symptoms. Metastatic breast cancer. EXAM: CT ABDOMEN AND PELVIS WITHOUT CONTRAST TECHNIQUE: Multidetector CT imaging of the abdomen and pelvis was performed following the standard protocol  without IV contrast. RADIATION DOSE REDUCTION: This exam was performed according to the departmental dose-optimization program which includes automated exposure control, adjustment of the mA and/or kV according to patient size and/or use of iterative reconstruction technique. COMPARISON:  12/29/2022 FINDINGS: Lower chest: Linear scarring at the lung bases. Hepatobiliary: Mild diffuse fatty change of the liver. No focal liver lesion seen without contrast. No calcified gallstones. Pancreas: Normal Spleen: Normal Adrenals/Urinary Tract: Adrenal glands are normal. Right kidney is normal. Left kidney contains a 2 mm nonobstructing stone in the midportion. No hydroureteronephrosis or passing stone. No stone in the bladder. Stomach/Bowel: Stomach and small intestine are normal. No abnormal colon finding. Vascular/Lymphatic: Aorta and IVC are normal. No retroperitoneal lymphadenopathy. Reproductive: No pelvic mass. Other: No free fluid or air. Musculoskeletal: Marked progression of lytic and sclerotic metastatic disease throughout the spine, sacrum and pelvis. Minor endplate deformities as seen previously without interval frank compression fracture, though the patient would certainly be at risk of such. See results of lumbar spine CT. IMPRESSION: 1. Marked progression of lytic and sclerotic metastatic disease throughout the spine, sacrum and pelvis. Minor endplate deformities as seen previously without interval frank compression fracture, though the patient would certainly be at risk of such. See results of lumbar spine CT. 2. 2 mm nonobstructing stone in the midportion of the left kidney. No hydroureteronephrosis or passing stone. 3. Mild diffuse fatty change of the liver. Electronically Signed   By: Paulina Fusi M.D.   On: 03/24/2023 15:03   Labs:   Basic Metabolic Panel: Recent Labs  Lab 03/24/23 1349 03/25/23 0044  NA 137 135  K 3.9 3.9  CL 100 101  CO2 23 22  GLUCOSE 113* 103*  BUN 10 12  CREATININE 0.77  0.82  CALCIUM 10.4* 9.9  MG 2.1 2.1  PHOS 5.5* 5.9*     CBC: Recent Labs  Lab 03/24/23 1349 03/25/23 0044  WBC 11.0* 8.3  NEUTROABS 7.4  --   HGB 12.2 10.7*  HCT 39.0 34.2*  MCV 88.4 89.3  PLT 391 330         SIGNED:   Calvert Cantor, MD  [facility] 03/27/2023, 4:34 PM

## 2023-03-27 NOTE — Progress Notes (Signed)
Patient discharged home, discharge instructions reviewed with patient who verbalized understanding.

## 2023-03-27 NOTE — Progress Notes (Signed)
[facility] Progress Note  Patient: Natalie Griffin     ZOX:[id number]  DOA: 03/24/2023   PCP: Charlane Ferretti, DO       Brief hospital course: This is a 47 year old female with breast cancer initially diagnosed in 2021, now metastatic with metastasis to left femur who is receiving palliative radiation to this area along with chemotherapy and anastrozole.  When diagnosed in 9/21, she underwent a left lumpectomy which showed clear margins and then refused adjuvant chemo, adjuvant radiation and adjuvant antiestrogen therapy.  Recently the patient had increasing pain in her back which was uncontrolled with oral narcotics at home and therefore presented to the hospital.  Subjective:  Patient had nausea and vomiting today.  She was constipated yesterday.  Had a number of bowel movements yesterday evening.  She feels that her pain is worse ever since the vomiting and mostly present in her mid back, flanks and sides of her abdomen.  Oxycodone is making her feel extremely dizzy and "loopy". .  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 Pamelia Hoit -Continue anastrozole - The patient's pain is just lateral to upper L-spine and on the left and is exacerbated by movement - I have reviewed the patients C, T  spine MRIs- she has numerous areas with vertebral mets and compression fractures -Have consulted and spoken with rad onc-Dr Kathrynn Running plans to begin radiation on her L spine on Monday - MS contin increased to 60 mg BID is helping -Start Dilaudid orally and DC oxycodone-if she is tolerating oral medications tomorrow without much IV narcotic usage, will discharge her   Active Problems:  Constipation - Has resolved         Code Status: DNR Consultants: Radiation oncology Level of Care: Level of care: Med-Surg Total time on  patient care: 35 minutes DVT prophylaxis:  SCDs Start: 03/24/23 2300     Objective:   Vitals:   03/26/23 1303 03/26/23 2023 03/27/23 0400 03/27/23 1338  BP: 95/82 106/79 123/65 116/64  Pulse: (!) 57 63 (!) 57 63  Resp: 15 14 20 18   Temp: 98.8 F (37.1 C) 98.2 F (36.8 C) 98.5 F (36.9 C) 98.6 F (37 C)  TempSrc: Oral Oral Oral Oral  SpO2:  97% 100% 99%  Weight:      Height:       Filed Weights   03/24/23 1157  Weight: 69 kg   Exam: General exam: Appears comfortable  HEENT: oral mucosa moist Respiratory system: Clear to auscultation.  Cardiovascular system: S1 & S2 heard  Gastrointestinal system: Abdomen soft, non-tender, nondistended. Normal bowel sounds   Extremities: No cyanosis, clubbing or edema Psychiatry:  Mood & affect appropriate.      CBC: Recent Labs  Lab 03/24/23 1349 03/25/23 0044  WBC 11.0* 8.3  NEUTROABS 7.4  --   HGB 12.2 10.7*  HCT 39.0 34.2*  MCV 88.4 89.3  PLT 391 330    Basic Metabolic Panel: Recent Labs  Lab 03/24/23 1349 03/25/23 0044  NA 137 135  K 3.9 3.9  CL 100 101  CO2 23 22  GLUCOSE 113* 103*  BUN 10 12  CREATININE 0.77 0.82  CALCIUM 10.4* 9.9  MG 2.1 2.1  PHOS 5.5* 5.9*    GFR: Estimated Creatinine Clearance: 81.9 mL/min (by C-G formula based on SCr of 0.82 mg/dL).  Scheduled Meds:  anastrozole  1 mg Oral Daily   ascorbic acid  500 mg Oral Daily   docusate sodium  100 mg Oral BID   morphine  60 mg Oral Q12H   naproxen  500 mg Oral BID WC   polyethylene glycol  17 g Oral Daily   senna  1 tablet Oral QHS   Continuous Infusions:   Imaging and lab data was personally reviewed MR Cervical Spine W and Wo Contrast  Result Date: 03/25/2023 CLINICAL DATA:  Osseous metastatic disease. EXAM: MRI CERVICAL AND THORACIC SPINE WITHOUT AND WITH CONTRAST TECHNIQUE: Multiplanar and multiecho pulse sequences of the cervical spine, to include the craniocervical junction and cervicothoracic junction, and the thoracic spine,  were obtained without and with intravenous contrast. CONTRAST:  7mL GADAVIST GADOBUTROL 1 MMOL/ML IV SOLN COMPARISON:  Thoracic spine MRI 12/29/2022 FINDINGS: MRI CERVICAL SPINE FINDINGS Alignment: Normal. Vertebrae: There is widespread osseous metastatic disease in the cervical spine and skull base most notably involving the left occipital condyle, bilateral C1 lateral masses and posterior C1 ring, C2 vertebral body and posterior elements, and C4 vertebral body and left posterior elements. There is no epidural tumor. Cord: Normal in signal and morphology without abnormal enhancement. Posterior Fossa, vertebral arteries, paraspinal tissues: Imaged posterior fossa is unremarkable. The vertebral artery flow voids are normal. The paraspinal soft tissues are unremarkable. Disc levels: C2-C3: Mild facet arthropathy without significant spinal canal or neural foraminal stenosis C3-C4: There is a shallow posterior disc osteophyte complex and mild bilateral facet arthropathy resulting in mild right and no significant left neural foraminal stenosis and no significant spinal canal stenosis C4-C5: Mild facet arthropathy without significant spinal canal or neural foraminal stenosis. C5-C6: No significant spinal canal or neural foraminal stenosis. C6-C7: There is a shallow disc protrusion and mild facet arthropathy with ligamentum flavum thickening resulting in mild spinal canal stenosis and mild left and no significant right neural foraminal stenosis C7-T1: Mild facet arthropathy resulting in mild right and no significant left neural foraminal stenosis and no significant spinal canal stenosis. MRI THORACIC SPINE FINDINGS Alignment:  Normal. Vertebrae: Again seen is widespread osseous metastatic disease throughout the thoracic spine involving essentially every level, most extensive at T8 through T12 where there is also extensive involvement of the posterior elements. Pathologic compression deformity of the T4, T5, T10, and T12  vertebral bodies is again seen. The loss of height at T10 has progressed since the prior MRI from 12/29/2022, now with up to approximately 50% loss of vertebral body height. The other vertebral body height loss is similar to the prior study. There is a small amount of epidural tumor along the right aspect of the posterior T8 endplate (20-22). There is a more prominent epidural tumor measuring up to proximally 5 mm in thickness along the left aspect of the posterior T10 through L1 endplates, most pronounced at T10 and T11 (23-11, 23-8, 24-28, 24-31). This epidural extension of tumor appears progressed since the prior MRI from 12/29/2022. The tumor resulting in no greater than mild spinal canal stenosis, without cord compression. Cord: Normal in signal and morphology without abnormal enhancement. As above, there is no cord compression. Paraspinal and other soft tissues: Previously seen edema in the paraspinal soft tissues at T9 through T11 is no longer seen. Disc levels: There is mild underlying degenerative change of the thoracic spine without high-grade spinal canal or neural foraminal stenosis. IMPRESSION: 1. Widespread osseous metastatic disease throughout the imaged skull base, and cervical and thoracic spine. 2. Pathologic compression deformity of the T10  vertebral body has progressed since the prior MRI from 12/29/2022, now with up to approximately 50% loss of vertebral body height. Compression deformity of the T4, T5, and T12 vertebral bodies is similar to the prior study. 3. Epidural extension of tumor at T8 through L1 has progressed since the prior MRI but without significant spinal canal stenosis or cord compression. 4. No epidural tumor in the cervical spine. 5. Previously seen paraspinal soft tissue edema at T9 through T11 is no longer seen. Electronically Signed   By: Lesia Hausen M.D.   On: 03/25/2023 18:49   MR THORACIC SPINE W WO CONTRAST  Result Date: 03/25/2023 CLINICAL DATA:  Osseous metastatic  disease. EXAM: MRI CERVICAL AND THORACIC SPINE WITHOUT AND WITH CONTRAST TECHNIQUE: Multiplanar and multiecho pulse sequences of the cervical spine, to include the craniocervical junction and cervicothoracic junction, and the thoracic spine, were obtained without and with intravenous contrast. CONTRAST:  7mL GADAVIST GADOBUTROL 1 MMOL/ML IV SOLN COMPARISON:  Thoracic spine MRI 12/29/2022 FINDINGS: MRI CERVICAL SPINE FINDINGS Alignment: Normal. Vertebrae: There is widespread osseous metastatic disease in the cervical spine and skull base most notably involving the left occipital condyle, bilateral C1 lateral masses and posterior C1 ring, C2 vertebral body and posterior elements, and C4 vertebral body and left posterior elements. There is no epidural tumor. Cord: Normal in signal and morphology without abnormal enhancement. Posterior Fossa, vertebral arteries, paraspinal tissues: Imaged posterior fossa is unremarkable. The vertebral artery flow voids are normal. The paraspinal soft tissues are unremarkable. Disc levels: C2-C3: Mild facet arthropathy without significant spinal canal or neural foraminal stenosis C3-C4: There is a shallow posterior disc osteophyte complex and mild bilateral facet arthropathy resulting in mild right and no significant left neural foraminal stenosis and no significant spinal canal stenosis C4-C5: Mild facet arthropathy without significant spinal canal or neural foraminal stenosis. C5-C6: No significant spinal canal or neural foraminal stenosis. C6-C7: There is a shallow disc protrusion and mild facet arthropathy with ligamentum flavum thickening resulting in mild spinal canal stenosis and mild left and no significant right neural foraminal stenosis C7-T1: Mild facet arthropathy resulting in mild right and no significant left neural foraminal stenosis and no significant spinal canal stenosis. MRI THORACIC SPINE FINDINGS Alignment:  Normal. Vertebrae: Again seen is widespread osseous  metastatic disease throughout the thoracic spine involving essentially every level, most extensive at T8 through T12 where there is also extensive involvement of the posterior elements. Pathologic compression deformity of the T4, T5, T10, and T12 vertebral bodies is again seen. The loss of height at T10 has progressed since the prior MRI from 12/29/2022, now with up to approximately 50% loss of vertebral body height. The other vertebral body height loss is similar to the prior study. There is a small amount of epidural tumor along the right aspect of the posterior T8 endplate (20-22). There is a more prominent epidural tumor measuring up to proximally 5 mm in thickness along the left aspect of the posterior T10 through L1 endplates, most pronounced at T10 and T11 (23-11, 23-8, 24-28, 24-31). This epidural extension of tumor appears progressed since the prior MRI from 12/29/2022. The tumor resulting in no greater than mild spinal canal stenosis, without cord compression. Cord: Normal in signal and morphology without abnormal enhancement. As above, there is no cord compression. Paraspinal and other soft tissues: Previously seen edema in the paraspinal soft tissues at T9 through T11 is no longer seen. Disc levels: There is mild underlying degenerative change of the thoracic spine without  high-grade spinal canal or neural foraminal stenosis. IMPRESSION: 1. Widespread osseous metastatic disease throughout the imaged skull base, and cervical and thoracic spine. 2. Pathologic compression deformity of the T10 vertebral body has progressed since the prior MRI from 12/29/2022, now with up to approximately 50% loss of vertebral body height. Compression deformity of the T4, T5, and T12 vertebral bodies is similar to the prior study. 3. Epidural extension of tumor at T8 through L1 has progressed since the prior MRI but without significant spinal canal stenosis or cord compression. 4. No epidural tumor in the cervical spine. 5.  Previously seen paraspinal soft tissue edema at T9 through T11 is no longer seen. Electronically Signed   By: Lesia Hausen M.D.   On: 03/25/2023 18:49    LOS: 3 days   Author: Calvert Cantor  03/27/2023 3:54 PM  To contact [facility]>   Check the care team in [facility] and look for the attending/consulting [facility] provider listed  Log into [email or web address] and use [facility] universal password   Go to> "[facility]"  and find provider  If you still have difficulty reaching the provider, please page the [facility] (Director on Call) for the Hospitalists listed on amion

## 2023-03-29 ENCOUNTER — Ambulatory Visit
Admission: RE | Admit: 2023-03-29 | Discharge: 2023-03-29 | Disposition: A | Discharge status: Home / Self Care | Payer: 59 | Source: Ambulatory Visit | Attending: Radiation Oncology | Admitting: Radiation Oncology

## 2023-03-29 ENCOUNTER — Other Ambulatory Visit: Payer: Self-pay

## 2023-03-29 ENCOUNTER — Other Ambulatory Visit (HOSPITAL_COMMUNITY): Payer: Self-pay

## 2023-03-29 DIAGNOSIS — C50512 Malignant neoplasm of lower-outer quadrant of left female breast: Secondary | ICD-10-CM | POA: Diagnosis not present

## 2023-03-29 DIAGNOSIS — C50212 Malignant neoplasm of upper-inner quadrant of left female breast: Secondary | ICD-10-CM | POA: Diagnosis not present

## 2023-03-29 DIAGNOSIS — C7951 Secondary malignant neoplasm of bone: Secondary | ICD-10-CM

## 2023-03-29 DIAGNOSIS — Z510 Encounter for antineoplastic radiation therapy: Secondary | ICD-10-CM | POA: Diagnosis not present

## 2023-03-29 LAB — RAD ONC ARIA SESSION SUMMARY
Course Elapsed Days: 6
Plan Fractions Treated to Date: 1
Plan Fractions Treated to Date: 4
Plan Prescribed Dose Per Fraction: 3 Gy
Plan Prescribed Dose Per Fraction: 3 Gy
Plan Total Fractions Prescribed: 10
Plan Total Fractions Prescribed: 10
Plan Total Prescribed Dose: 30 Gy
Plan Total Prescribed Dose: 30 Gy
Reference Point Dosage Given to Date: 12 Gy
Reference Point Dosage Given to Date: 3 Gy
Reference Point Session Dosage Given: 3 Gy
Reference Point Session Dosage Given: 3 Gy
Session Number: 4

## 2023-03-30 ENCOUNTER — Other Ambulatory Visit: Payer: Self-pay

## 2023-03-30 ENCOUNTER — Ambulatory Visit: Admission: RE | Admit: 2023-03-30 | Payer: 59 | Source: Ambulatory Visit

## 2023-03-30 DIAGNOSIS — C50212 Malignant neoplasm of upper-inner quadrant of left female breast: Secondary | ICD-10-CM | POA: Diagnosis not present

## 2023-03-30 DIAGNOSIS — Z510 Encounter for antineoplastic radiation therapy: Secondary | ICD-10-CM | POA: Diagnosis not present

## 2023-03-30 DIAGNOSIS — C7951 Secondary malignant neoplasm of bone: Secondary | ICD-10-CM | POA: Diagnosis not present

## 2023-03-30 DIAGNOSIS — C50512 Malignant neoplasm of lower-outer quadrant of left female breast: Secondary | ICD-10-CM | POA: Diagnosis not present

## 2023-03-30 LAB — RAD ONC ARIA SESSION SUMMARY
Course Elapsed Days: 7
Plan Fractions Treated to Date: 1
Plan Fractions Treated to Date: 5
Plan Prescribed Dose Per Fraction: 3 Gy
Plan Prescribed Dose Per Fraction: 3 Gy
Plan Total Fractions Prescribed: 10
Plan Total Fractions Prescribed: 9
Plan Total Prescribed Dose: 27 Gy
Plan Total Prescribed Dose: 30 Gy
Reference Point Dosage Given to Date: 15 Gy
Reference Point Dosage Given to Date: 6 Gy
Reference Point Session Dosage Given: 3 Gy
Reference Point Session Dosage Given: 3 Gy
Session Number: 5

## 2023-03-31 ENCOUNTER — Other Ambulatory Visit (HOSPITAL_COMMUNITY): Payer: Self-pay

## 2023-03-31 ENCOUNTER — Other Ambulatory Visit: Payer: Self-pay

## 2023-03-31 ENCOUNTER — Ambulatory Visit
Admission: RE | Admit: 2023-03-31 | Discharge: 2023-03-31 | Disposition: A | Discharge status: Home / Self Care | Payer: 59 | Source: Ambulatory Visit | Attending: Radiation Oncology | Admitting: Radiation Oncology

## 2023-03-31 DIAGNOSIS — Z510 Encounter for antineoplastic radiation therapy: Secondary | ICD-10-CM | POA: Diagnosis not present

## 2023-03-31 DIAGNOSIS — C50212 Malignant neoplasm of upper-inner quadrant of left female breast: Secondary | ICD-10-CM | POA: Diagnosis not present

## 2023-03-31 DIAGNOSIS — C50512 Malignant neoplasm of lower-outer quadrant of left female breast: Secondary | ICD-10-CM | POA: Diagnosis not present

## 2023-03-31 DIAGNOSIS — C7951 Secondary malignant neoplasm of bone: Secondary | ICD-10-CM | POA: Diagnosis not present

## 2023-03-31 LAB — RAD ONC ARIA SESSION SUMMARY
Course Elapsed Days: 8
Plan Fractions Treated to Date: 2
Plan Fractions Treated to Date: 6
Plan Prescribed Dose Per Fraction: 3 Gy
Plan Prescribed Dose Per Fraction: 3 Gy
Plan Total Fractions Prescribed: 10
Plan Total Fractions Prescribed: 9
Plan Total Prescribed Dose: 27 Gy
Plan Total Prescribed Dose: 30 Gy
Reference Point Dosage Given to Date: 18 Gy
Reference Point Dosage Given to Date: 9 Gy
Reference Point Session Dosage Given: 3 Gy
Reference Point Session Dosage Given: 3 Gy
Session Number: 6

## 2023-04-01 ENCOUNTER — Other Ambulatory Visit: Payer: Self-pay

## 2023-04-01 ENCOUNTER — Ambulatory Visit
Admission: RE | Admit: 2023-04-01 | Discharge: 2023-04-01 | Disposition: A | Discharge status: Home / Self Care | Payer: 59 | Source: Ambulatory Visit | Attending: Radiation Oncology | Admitting: Radiation Oncology

## 2023-04-01 ENCOUNTER — Other Ambulatory Visit (HOSPITAL_COMMUNITY): Payer: Self-pay

## 2023-04-01 DIAGNOSIS — Z510 Encounter for antineoplastic radiation therapy: Secondary | ICD-10-CM | POA: Diagnosis not present

## 2023-04-01 DIAGNOSIS — C7951 Secondary malignant neoplasm of bone: Secondary | ICD-10-CM | POA: Diagnosis not present

## 2023-04-01 DIAGNOSIS — C50212 Malignant neoplasm of upper-inner quadrant of left female breast: Secondary | ICD-10-CM | POA: Diagnosis not present

## 2023-04-01 DIAGNOSIS — C50512 Malignant neoplasm of lower-outer quadrant of left female breast: Secondary | ICD-10-CM | POA: Diagnosis not present

## 2023-04-01 LAB — RAD ONC ARIA SESSION SUMMARY
Course Elapsed Days: 9
Plan Fractions Treated to Date: 3
Plan Fractions Treated to Date: 7
Plan Prescribed Dose Per Fraction: 3 Gy
Plan Prescribed Dose Per Fraction: 3 Gy
Plan Total Fractions Prescribed: 10
Plan Total Fractions Prescribed: 9
Plan Total Prescribed Dose: 27 Gy
Plan Total Prescribed Dose: 30 Gy
Reference Point Dosage Given to Date: 12 Gy
Reference Point Dosage Given to Date: 21 Gy
Reference Point Session Dosage Given: 3 Gy
Reference Point Session Dosage Given: 3 Gy
Session Number: 7

## 2023-04-02 ENCOUNTER — Ambulatory Visit
Admission: RE | Admit: 2023-04-02 | Discharge: 2023-04-02 | Disposition: A | Discharge status: Home / Self Care | Payer: 59 | Source: Ambulatory Visit | Attending: Radiation Oncology | Admitting: Radiation Oncology

## 2023-04-02 ENCOUNTER — Other Ambulatory Visit: Payer: Self-pay

## 2023-04-02 ENCOUNTER — Ambulatory Visit: Admission: RE | Admit: 2023-04-02 | Payer: 59 | Source: Ambulatory Visit

## 2023-04-02 ENCOUNTER — Other Ambulatory Visit (HOSPITAL_COMMUNITY): Payer: Self-pay

## 2023-04-02 DIAGNOSIS — C50512 Malignant neoplasm of lower-outer quadrant of left female breast: Secondary | ICD-10-CM | POA: Diagnosis not present

## 2023-04-02 DIAGNOSIS — C50212 Malignant neoplasm of upper-inner quadrant of left female breast: Secondary | ICD-10-CM | POA: Diagnosis not present

## 2023-04-02 DIAGNOSIS — Z510 Encounter for antineoplastic radiation therapy: Secondary | ICD-10-CM | POA: Diagnosis not present

## 2023-04-02 DIAGNOSIS — C7951 Secondary malignant neoplasm of bone: Secondary | ICD-10-CM | POA: Diagnosis not present

## 2023-04-02 LAB — RAD ONC ARIA SESSION SUMMARY
Course Elapsed Days: 10
Plan Fractions Treated to Date: 4
Plan Fractions Treated to Date: 8
Plan Prescribed Dose Per Fraction: 3 Gy
Plan Prescribed Dose Per Fraction: 3 Gy
Plan Total Fractions Prescribed: 10
Plan Total Fractions Prescribed: 9
Plan Total Prescribed Dose: 27 Gy
Plan Total Prescribed Dose: 30 Gy
Reference Point Dosage Given to Date: 15 Gy
Reference Point Dosage Given to Date: 24 Gy
Reference Point Session Dosage Given: 3 Gy
Reference Point Session Dosage Given: 3 Gy
Session Number: 8

## 2023-04-05 ENCOUNTER — Ambulatory Visit: Payer: Self-pay | Admitting: Surgery

## 2023-04-05 ENCOUNTER — Ambulatory Visit: Admission: RE | Admit: 2023-04-05 | Payer: 59 | Source: Ambulatory Visit

## 2023-04-05 ENCOUNTER — Other Ambulatory Visit: Payer: Self-pay

## 2023-04-05 DIAGNOSIS — Z510 Encounter for antineoplastic radiation therapy: Secondary | ICD-10-CM | POA: Diagnosis not present

## 2023-04-05 DIAGNOSIS — C50911 Malignant neoplasm of unspecified site of right female breast: Secondary | ICD-10-CM

## 2023-04-05 DIAGNOSIS — C7951 Secondary malignant neoplasm of bone: Secondary | ICD-10-CM | POA: Diagnosis not present

## 2023-04-05 DIAGNOSIS — C50212 Malignant neoplasm of upper-inner quadrant of left female breast: Secondary | ICD-10-CM | POA: Diagnosis not present

## 2023-04-05 DIAGNOSIS — C50512 Malignant neoplasm of lower-outer quadrant of left female breast: Secondary | ICD-10-CM | POA: Diagnosis not present

## 2023-04-05 LAB — RAD ONC ARIA SESSION SUMMARY
Course Elapsed Days: 13
Plan Fractions Treated to Date: 5
Plan Fractions Treated to Date: 9
Plan Prescribed Dose Per Fraction: 3 Gy
Plan Prescribed Dose Per Fraction: 3 Gy
Plan Total Fractions Prescribed: 10
Plan Total Fractions Prescribed: 9
Plan Total Prescribed Dose: 27 Gy
Plan Total Prescribed Dose: 30 Gy
Reference Point Dosage Given to Date: 18 Gy
Reference Point Dosage Given to Date: 27 Gy
Reference Point Session Dosage Given: 3 Gy
Reference Point Session Dosage Given: 3 Gy
Session Number: 9

## 2023-04-06 ENCOUNTER — Other Ambulatory Visit (HOSPITAL_COMMUNITY): Payer: Self-pay

## 2023-04-06 ENCOUNTER — Other Ambulatory Visit: Payer: Self-pay

## 2023-04-06 ENCOUNTER — Ambulatory Visit
Admission: RE | Admit: 2023-04-06 | Discharge: 2023-04-06 | Disposition: A | Discharge status: Home / Self Care | Payer: 59 | Source: Ambulatory Visit | Attending: Radiation Oncology | Admitting: Radiation Oncology

## 2023-04-06 DIAGNOSIS — C50512 Malignant neoplasm of lower-outer quadrant of left female breast: Secondary | ICD-10-CM | POA: Diagnosis not present

## 2023-04-06 DIAGNOSIS — C50212 Malignant neoplasm of upper-inner quadrant of left female breast: Secondary | ICD-10-CM | POA: Diagnosis not present

## 2023-04-06 DIAGNOSIS — Z510 Encounter for antineoplastic radiation therapy: Secondary | ICD-10-CM | POA: Diagnosis not present

## 2023-04-06 DIAGNOSIS — C7951 Secondary malignant neoplasm of bone: Secondary | ICD-10-CM | POA: Diagnosis not present

## 2023-04-06 LAB — RAD ONC ARIA SESSION SUMMARY
Course Elapsed Days: 14
Plan Fractions Treated to Date: 10
Plan Fractions Treated to Date: 6
Plan Prescribed Dose Per Fraction: 3 Gy
Plan Prescribed Dose Per Fraction: 3 Gy
Plan Total Fractions Prescribed: 10
Plan Total Fractions Prescribed: 9
Plan Total Prescribed Dose: 27 Gy
Plan Total Prescribed Dose: 30 Gy
Reference Point Dosage Given to Date: 21 Gy
Reference Point Dosage Given to Date: 30 Gy
Reference Point Session Dosage Given: 3 Gy
Reference Point Session Dosage Given: 3 Gy
Session Number: 10

## 2023-04-07 ENCOUNTER — Other Ambulatory Visit: Payer: Self-pay

## 2023-04-07 ENCOUNTER — Other Ambulatory Visit (HOSPITAL_COMMUNITY): Payer: Self-pay

## 2023-04-07 ENCOUNTER — Ambulatory Visit
Admission: RE | Admit: 2023-04-07 | Discharge: 2023-04-07 | Disposition: A | Discharge status: Home / Self Care | Payer: 59 | Source: Ambulatory Visit | Attending: Radiation Oncology | Admitting: Radiation Oncology

## 2023-04-07 DIAGNOSIS — C50512 Malignant neoplasm of lower-outer quadrant of left female breast: Secondary | ICD-10-CM | POA: Diagnosis not present

## 2023-04-07 DIAGNOSIS — C7951 Secondary malignant neoplasm of bone: Secondary | ICD-10-CM | POA: Diagnosis not present

## 2023-04-07 DIAGNOSIS — C50212 Malignant neoplasm of upper-inner quadrant of left female breast: Secondary | ICD-10-CM | POA: Diagnosis not present

## 2023-04-07 DIAGNOSIS — Z510 Encounter for antineoplastic radiation therapy: Secondary | ICD-10-CM | POA: Diagnosis not present

## 2023-04-07 LAB — RAD ONC ARIA SESSION SUMMARY
Course Elapsed Days: 15
Plan Fractions Treated to Date: 7
Plan Prescribed Dose Per Fraction: 3 Gy
Plan Total Fractions Prescribed: 9
Plan Total Prescribed Dose: 27 Gy
Reference Point Dosage Given to Date: 24 Gy
Reference Point Session Dosage Given: 3 Gy
Session Number: 11

## 2023-04-08 ENCOUNTER — Other Ambulatory Visit: Payer: Self-pay

## 2023-04-08 ENCOUNTER — Ambulatory Visit
Admission: RE | Admit: 2023-04-08 | Discharge: 2023-04-08 | Disposition: A | Discharge status: Home / Self Care | Payer: 59 | Source: Ambulatory Visit | Attending: Radiation Oncology | Admitting: Radiation Oncology

## 2023-04-08 ENCOUNTER — Other Ambulatory Visit: Payer: Self-pay | Admitting: Surgery

## 2023-04-08 DIAGNOSIS — C50512 Malignant neoplasm of lower-outer quadrant of left female breast: Secondary | ICD-10-CM | POA: Diagnosis not present

## 2023-04-08 DIAGNOSIS — C7951 Secondary malignant neoplasm of bone: Secondary | ICD-10-CM | POA: Diagnosis not present

## 2023-04-08 DIAGNOSIS — Z510 Encounter for antineoplastic radiation therapy: Secondary | ICD-10-CM | POA: Diagnosis not present

## 2023-04-08 DIAGNOSIS — C50911 Malignant neoplasm of unspecified site of right female breast: Secondary | ICD-10-CM

## 2023-04-08 DIAGNOSIS — C50212 Malignant neoplasm of upper-inner quadrant of left female breast: Secondary | ICD-10-CM | POA: Diagnosis not present

## 2023-04-08 LAB — RAD ONC ARIA SESSION SUMMARY
Course Elapsed Days: 16
Plan Fractions Treated to Date: 8
Plan Prescribed Dose Per Fraction: 3 Gy
Plan Total Fractions Prescribed: 9
Plan Total Prescribed Dose: 27 Gy
Reference Point Dosage Given to Date: 27 Gy
Reference Point Session Dosage Given: 3 Gy
Session Number: 12

## 2023-04-09 ENCOUNTER — Ambulatory Visit: Payer: 59

## 2023-04-12 ENCOUNTER — Other Ambulatory Visit: Payer: Self-pay

## 2023-04-12 ENCOUNTER — Ambulatory Visit
Admission: RE | Admit: 2023-04-12 | Discharge: 2023-04-12 | Disposition: A | Discharge status: Home / Self Care | Payer: 59 | Source: Ambulatory Visit | Attending: Radiation Oncology | Admitting: Radiation Oncology

## 2023-04-12 ENCOUNTER — Ambulatory Visit: Admission: RE | Admit: 2023-04-12 | Payer: 59 | Source: Ambulatory Visit

## 2023-04-12 DIAGNOSIS — Z510 Encounter for antineoplastic radiation therapy: Secondary | ICD-10-CM | POA: Diagnosis not present

## 2023-04-12 DIAGNOSIS — C7951 Secondary malignant neoplasm of bone: Secondary | ICD-10-CM | POA: Diagnosis not present

## 2023-04-12 DIAGNOSIS — C50212 Malignant neoplasm of upper-inner quadrant of left female breast: Secondary | ICD-10-CM | POA: Diagnosis not present

## 2023-04-12 DIAGNOSIS — C50512 Malignant neoplasm of lower-outer quadrant of left female breast: Secondary | ICD-10-CM | POA: Diagnosis not present

## 2023-04-12 LAB — RAD ONC ARIA SESSION SUMMARY
Course Elapsed Days: 20
Plan Fractions Treated to Date: 9
Plan Prescribed Dose Per Fraction: 3 Gy
Plan Total Fractions Prescribed: 9
Plan Total Prescribed Dose: 27 Gy
Reference Point Dosage Given to Date: 30 Gy
Reference Point Session Dosage Given: 3 Gy
Session Number: 13

## 2023-04-13 ENCOUNTER — Encounter: Payer: Self-pay | Admitting: Hematology and Oncology

## 2023-04-13 ENCOUNTER — Encounter (HOSPITAL_COMMUNITY): Payer: Self-pay | Admitting: Pharmacist

## 2023-04-13 ENCOUNTER — Other Ambulatory Visit (HOSPITAL_COMMUNITY): Payer: Self-pay

## 2023-04-13 MED ORDER — AMPHETAMINE-DEXTROAMPHETAMINE 10 MG PO TABS
5.0000 mg | ORAL_TABLET | Freq: Two times a day (BID) | ORAL | 0 refills | Status: DC | PRN
Start: 2023-04-13 — End: 2023-06-17
  Filled 2023-04-13: qty 60, 30d supply, fill #0

## 2023-04-14 ENCOUNTER — Other Ambulatory Visit (HOSPITAL_COMMUNITY): Payer: Self-pay

## 2023-04-14 ENCOUNTER — Telehealth: Payer: Self-pay

## 2023-04-14 ENCOUNTER — Encounter: Payer: Self-pay | Admitting: Hematology and Oncology

## 2023-04-14 ENCOUNTER — Other Ambulatory Visit: Payer: Self-pay | Admitting: Hematology and Oncology

## 2023-04-14 DIAGNOSIS — C50212 Malignant neoplasm of upper-inner quadrant of left female breast: Secondary | ICD-10-CM

## 2023-04-14 NOTE — Progress Notes (Signed)
ON PATHWAY REGIMEN - Breast  No Change  Continue With Treatment as Ordered.  Original Decision Date/Time: 06/06/2021 09:23     Cycle 1: A cycle is 21 days:     Trastuzumab-xxxx    Cycles 2 and beyond: A cycle is every 21 days:     Trastuzumab-xxxx   **Always confirm dose/schedule in your pharmacy ordering system**  Patient Characteristics: Distant Metastases or Locoregional Recurrent Disease - Unresected or Locally Advanced Unresectable Disease Progressing after Neoadjuvant and Local Therapies, HER2 Positive, ER Positive, HER2-Targeted Therapy (Concurrent with Endocrine Therapy) Therapeutic Status: Distant Metastases HER2 Status: Positive (+) ER Status: Positive (+) PR Status: Negative (-) Intent of Therapy: Non-Curative / Palliative Intent, Discussed with Patient

## 2023-04-14 NOTE — Telephone Encounter (Signed)
Called Pt regarding herceptin hylecta approval. Received secure chat from Lurline Hare stating that insurance denial for herceptin hylecta has been overturned and can now be ordered. Message relayed to MD who placed orders and scheduling requests. Called Pt and relayed above message that our scheduling team will be in touch. Pt states "I will be getting a lawyer because I have gone without this medicine for 7 months". Expressed to Pt that Domenick Bookbinder and Lurline Hare have been working on the approval since she reached out at the beginning of July. Pt states "It is fishy that you all said insurance was denying the herceptin, but when I called they immediately wanted to pay for it. I will be getting a lawyer." This RN stated that getting a lawyer is within her right. Pt states "Yes it is.". Advised Pt that we are here to help and care about her health/ongoing treatment. Advised Pt that schedulers will be in contact for herceptin appointments. Pt verbalized understanding.

## 2023-04-14 NOTE — Progress Notes (Signed)
Herceptin Hylecta was approved by insurance.  Sending the orders for the patient to be scheduled.

## 2023-04-15 ENCOUNTER — Other Ambulatory Visit: Payer: Self-pay

## 2023-04-18 NOTE — Progress Notes (Signed)
Patient Care Team: Charlane Ferretti, DO as PCP - General (Internal Medicine) Donnelly Angelica, RN as Oncology Nurse Navigator Pershing Proud, RN as Oncology Nurse Navigator Rachel Moulds, MD as Consulting Physician (Hematology and Oncology)  DIAGNOSIS: No diagnosis found.  SUMMARY OF ONCOLOGIC HISTORY: Oncology History  Malignant neoplasm of upper-inner quadrant of left breast in female, estrogen receptor positive (HCC)  04/11/2020 Initial Diagnosis   Patient palpated a left breast lump. Mammogram on 03/08/20 showed a 3.0cm mass at the 11 o'clock position with no axillary adenopathy. 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    02/2021 Relapse/Recurrence   Patient went to orthopedics for right hip pain.  Imaging revealed bone metastasis.  CT CAP: Cortical destruction of the right femoral neck consistent with skeletal metastases at risk for pathologic fracture.  Lucent lesion in the left iliac bone and L3 vertebral body consistent with multifocal skeletal metastases.   04/01/2021 Surgery   04/01/2021: Femoral intramedullary nail  Pathology review: Metastatic breast cancer ER 2% PR 0% HER2 3+ positive   04/08/2021 -  Anti-estrogen oral therapy   Tamoxifen 20 mg was prescribed on 04/08/2021 (discontinued by patient because she felt that the risks and benefits did not support it )--resumed in 07/2022 based on pathology results from right breast biopsy   05/14/2021 -  Chemotherapy   Subcutaneous Herceptin Perjeta (Phesgo) injection started 05/14/2021, switched to  Herceptin Hylecta 06/11/2021 (Fixed drug eruption, profound diarrhea to Phesgo)     05/20/2021 - 06/03/2021 Radiation Therapy   Palliative radiation to the iliac bone and hip   02/04/2022 Imaging   MRI thoracic spine 02/04/2022: Numerous bone metastases thoracic spine cervical and lumbar spines largest T5, T8, T11.  Chronic compression fractures T4, T5, T10 and T12    05/07/2022 Imaging   CT chest abdomen pelvis: No new or progressive bone metastases.  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   07/29/2022 PET scan   IMPRESSION: 1. Hypermetabolic mass in the posterior deep RIGHT breast consistent primary breast carcinoma. 2. Central nodal mediastinal metastasis to the high LEFT prevascular space and LEFT super clavicular node. 3. 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  [name] recommended changing treatment to [place] and Gracyn wanted to wait until after the holidays.  She will discuss further with Dr. Pamelia Hoit in January.   11/19/2022 - 11/19/2022 Chemotherapy   Patient is on Treatment Plan : BREAST Trastuzumab IV (8/6) or SQ (600) D1 q21d     04/21/2023 -  Chemotherapy   Patient is on Treatment Plan : BREAST MAINTENANCE Trastuzumab IV (6) or SQ (600) D1 q21d x 13 cycles       CHIEF COMPLIANT: Follow-up on metastatic breast cancer    INTERVAL HISTORY: Natalie Griffin is a 47 year old with a history of left breast cancer. Currently on anastrozole. She  presents to the clinic for a follow-up.    ALLERGIES:  is allergic to adhesive [tape], fentanyl, phesgo [pertuz-trastuz-hyaluron-zzxf], tetanus toxoid, and succinylcholine.  MEDICATIONS:  Current Outpatient Medications  Medication Sig Dispense Refill    acetaminophen (TYLENOL) 325 MG tablet Take 2 tablets (650 mg total) by mouth every 6 (six) hours as needed for mild pain (or Fever >/= 101).     ALPRAZolam (XANAX) 0.5 MG tablet Take 1 tablet (0.5 mg total) by mouth 3 (three) times daily as needed for anxiety. 60 tablet 3   amphetamine-dextroamphetamine (ADDERALL) 10 MG tablet Take 0.5-1 tablets (5-10 mg total) by mouth 2 (two) times daily as needed. 60 tablet 0   amphetamine-dextroamphetamine (ADDERALL) 10 MG tablet Take 1/2-1 tablet (5-10 mg total) by mouth 2 (two) times daily as needed. 60 tablet 0   anastrozole (ARIMIDEX) 1 MG tablet Take 1 tablet (1 mg total) by mouth daily. 90 tablet 3   ascorbic acid (VITAMIN C) 500 MG tablet Take 500 mg by mouth daily.     Calcium 200 MG TABS 300 mg.     Cholecalciferol (VITAMIN D3) 50 MCG (2000 UT) capsule Vitamin D3     Ferrous Sulfate (SLOW FE) 142 (45 Fe) MG TBCR 1 tablet Orally Three times a Week     HYDROmorphone (DILAUDID) 2 MG tablet Take 1-2 tablets (2-4 mg total) by mouth every 4 (four) hours as needed for severe pain. 60 tablet 0   methocarbamol (ROBAXIN) 500 MG tablet Take 1-2 tablets (500-1,000 mg total) by mouth every 6 (six) hours as needed for muscle spasms. 120 tablet 0   morphine (MS CONTIN) 30 MG 12 hr tablet Take 2 tablets (60 mg total) by mouth every 12 (twelve) hours. 60 tablet 0   Multiple Vitamin (MULTIVITAMIN ADULT PO) Multivitamin     naproxen (NAPROSYN) 500 MG tablet Take 1 tablet (500 mg total) by mouth 2 (two) times daily with a meal. 60 tablet 0   ondansetron (ZOFRAN) 4 MG tablet Take 1 tablet (4 mg total) by mouth every 6 (six) hours as needed for nausea. 30 tablet 0   polyethylene glycol (MIRALAX / GLYCOLAX) 17 g packet Take 17 g by mouth daily as needed. 14 packet 0   senna (SENOKOT) 8.6 MG TABS tablet Take 1 tablet (8.6 mg total) by mouth daily as needed for mild constipation. 120 tablet 0   No current facility-administered medications for this visit.    PHYSICAL  EXAMINATION: ECOG PERFORMANCE STATUS: {CHL ONC ECOG PS:[phone]}  There were no vitals filed for this visit. There were no vitals filed for this visit.  BREAST:*** No palpable masses or nodules in either right or left breasts. No palpable axillary supraclavicular or infraclavicular adenopathy no breast tenderness or nipple discharge. (exam performed in the presence of a chaperone)  LABORATORY DATA:  I have reviewed the data as listed    Latest Ref Rng & Units 03/25/2023   12:44 AM 03/24/2023    1:49 PM 02/09/2023    2:48 PM  CMP  Glucose 70 - 99 mg/dL 161  096  045   BUN 6 - 20 mg/dL 12  10  10    Creatinine 0.44 - 1.00 mg/dL 4.09  8.11  9.14   Sodium 135 - 145 mmol/L 135  137  140   Potassium 3.5 - 5.1 mmol/L 3.9  3.9  4.3   Chloride 98 - 111 mmol/L 101  100  105   CO2 22 - 32 mmol/L 22  23  25  Calcium 8.9 - 10.3 mg/dL 9.9  29.5  62.1   Total Protein 6.5 - 8.1 g/dL 7.4  8.5  8.2   Total Bilirubin 0.3 - 1.2 mg/dL 0.4  0.6  0.3   Alkaline Phos 38 - 126 U/L 348  398  393   AST 15 - 41 U/L 58  65  39   ALT 0 - 44 U/L 34  36  24     Lab Results  Component Value Date   WBC 8.3 03/25/2023   HGB 10.7 (L) 03/25/2023   HCT 34.2 (L) 03/25/2023   MCV 89.3 03/25/2023   PLT 330 03/25/2023   NEUTROABS 7.4 03/24/2023    ASSESSMENT & PLAN:  No problem-specific Assessment & Plan notes found for this encounter.    No orders of the defined types were placed in this encounter.  The patient has a good understanding of the overall plan. she agrees with it. she will call with any problems that may develop before the next visit here. Total time spent: 30 mins including face to face time and time spent for planning, charting and co-ordination of care   Sherlyn Lick, CMA 04/18/23    I Janan Ridge am acting as a Neurosurgeon for [employer]  ***

## 2023-04-20 ENCOUNTER — Inpatient Hospital Stay: Payer: 59 | Attending: Hematology and Oncology | Admitting: Hematology and Oncology

## 2023-04-20 ENCOUNTER — Inpatient Hospital Stay: Payer: 59

## 2023-04-20 ENCOUNTER — Other Ambulatory Visit (HOSPITAL_COMMUNITY): Payer: Self-pay

## 2023-04-20 VITALS — BP 116/76 | HR 112 | Temp 97.3°F | Resp 18 | Ht 65.0 in | Wt 150.5 lb

## 2023-04-20 VITALS — BP 106/77 | HR 86 | Resp 18

## 2023-04-20 DIAGNOSIS — R748 Abnormal levels of other serum enzymes: Secondary | ICD-10-CM | POA: Insufficient documentation

## 2023-04-20 DIAGNOSIS — Z170 Estrogen receptor positive status [ER+]: Secondary | ICD-10-CM

## 2023-04-20 DIAGNOSIS — C50411 Malignant neoplasm of upper-outer quadrant of right female breast: Secondary | ICD-10-CM | POA: Insufficient documentation

## 2023-04-20 DIAGNOSIS — C50212 Malignant neoplasm of upper-inner quadrant of left female breast: Secondary | ICD-10-CM | POA: Diagnosis not present

## 2023-04-20 DIAGNOSIS — Z5112 Encounter for antineoplastic immunotherapy: Secondary | ICD-10-CM | POA: Insufficient documentation

## 2023-04-20 DIAGNOSIS — C7951 Secondary malignant neoplasm of bone: Secondary | ICD-10-CM | POA: Diagnosis not present

## 2023-04-20 DIAGNOSIS — R079 Chest pain, unspecified: Secondary | ICD-10-CM | POA: Insufficient documentation

## 2023-04-20 LAB — CBC WITH DIFFERENTIAL (CANCER CENTER ONLY)
Abs Immature Granulocytes: 0.04 10*3/uL (ref 0.00–0.07)
Basophils Absolute: 0 10*3/uL (ref 0.0–0.1)
Basophils Relative: 1 %
Eosinophils Absolute: 0.5 10*3/uL (ref 0.0–0.5)
Eosinophils Relative: 8 %
HCT: 35.9 % — ABNORMAL LOW (ref 36.0–46.0)
Hemoglobin: 11.7 g/dL — ABNORMAL LOW (ref 12.0–15.0)
Immature Granulocytes: 1 %
Lymphocytes Relative: 13 %
Lymphs Abs: 0.7 10*3/uL (ref 0.7–4.0)
MCH: 28.2 pg (ref 26.0–34.0)
MCHC: 32.6 g/dL (ref 30.0–36.0)
MCV: 86.5 fL (ref 80.0–100.0)
Monocytes Absolute: 0.6 10*3/uL (ref 0.1–1.0)
Monocytes Relative: 11 %
Neutro Abs: 3.7 10*3/uL (ref 1.7–7.7)
Neutrophils Relative %: 66 %
Platelet Count: 300 10*3/uL (ref 150–400)
RBC: 4.15 MIL/uL (ref 3.87–5.11)
RDW: 15.7 % — ABNORMAL HIGH (ref 11.5–15.5)
WBC Count: 5.5 10*3/uL (ref 4.0–10.5)
nRBC: 0 % (ref 0.0–0.2)

## 2023-04-20 LAB — CMP (CANCER CENTER ONLY)
ALT: 14 U/L (ref 0–44)
AST: 26 U/L (ref 15–41)
Albumin: 4.4 g/dL (ref 3.5–5.0)
Alkaline Phosphatase: 381 U/L — ABNORMAL HIGH (ref 38–126)
Anion gap: 10 (ref 5–15)
BUN: 11 mg/dL (ref 6–20)
CO2: 24 mmol/L (ref 22–32)
Calcium: 10 mg/dL (ref 8.9–10.3)
Chloride: 105 mmol/L (ref 98–111)
Creatinine: 0.72 mg/dL (ref 0.44–1.00)
GFR, Estimated: >60 mL/min (ref >60)
Glucose, Bld: 120 mg/dL — ABNORMAL HIGH (ref 70–99)
Potassium: 4 mmol/L (ref 3.5–5.1)
Sodium: 139 mmol/L (ref 135–145)
Total Bilirubin: 0.3 mg/dL (ref 0.3–1.2)
Total Protein: 7.7 g/dL (ref 6.5–8.1)

## 2023-04-20 MED ORDER — DIPHENHYDRAMINE HCL 25 MG PO CAPS: 25.0000 mg | ORAL_CAPSULE | Freq: Once | ORAL | Status: DC

## 2023-04-20 MED ORDER — MORPHINE SULFATE ER 30 MG PO TBCR: 60.0000 mg | EXTENDED_RELEASE_TABLET | Freq: Two times a day (BID) | ORAL | 0 refills | Status: DC

## 2023-04-20 MED ORDER — HYDROMORPHONE HCL 2 MG PO TABS
2.0000 mg | ORAL_TABLET | Freq: Four times a day (QID) | ORAL | 0 refills | Status: DC | PRN
  Filled 2023-04-20: qty 60, 15d supply, fill #0

## 2023-04-20 MED ORDER — TRASTUZUMAB-HYALURONIDASE-OYSK 600-10000 MG-UNT/5ML ~~LOC~~ SOLN
600.0000 mg | Freq: Once | SUBCUTANEOUS | Status: AC
  Administered 2023-04-20: 600 mg via SUBCUTANEOUS
  Filled 2023-04-20: qty 5

## 2023-04-20 MED ORDER — ACETAMINOPHEN 325 MG PO TABS: 650.0000 mg | ORAL_TABLET | Freq: Once | ORAL | Status: DC

## 2023-04-20 NOTE — Assessment & Plan Note (Signed)
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  Patient refused adjuvant chemo, adjuvant radiation and adjuvant antiestrogen therapy.   Patient went to orthopedics for right hip pain.  Imaging revealed bone metastasis.  CT CAP: Cortical destruction of the right femoral neck consistent with skeletal metastases at risk for pathologic fracture.  Lucent lesion in the left iliac bone and L3 vertebral body consistent with multifocal skeletal metastases. 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%, HER2 3+ positive, Ki-67 35%    Treatment plan change: Oophorectomy: 09/30/2022 Current treatment: Anastrozole Patient refused switching from Herceptin to Kadcyla (her insurance denied Herceptin Hylecta and patient does not want to receive it in IV form), stopped Herceptin (last given 09/17/2022) Neratinib started 01/05/2023 discontinued 02/08/2023 Presuming Herceptin Hylecta 04/20/2023   Pain control: MS Contin and Percocets that she takes as needed, muscle relaxants have been Hospitalization 03/24/2023-03/27/2023 increased pain in the back: Vertebral mets and compression fractures: Radiation to lumbar spine completed  04/12/2023  Elevated alkaline phosphatase: I discussed with her that it is related to her bone metastases and hypercalcemia.  She does not want to take Xgeva.  And she will not receive infusions and therefore Zometa is out of question.   Current treatment: Herceptin Hylecta  Our plan is to obtain scans in August after she returns back from [place]. Every 3-week follow-up for Herceptin

## 2023-04-20 NOTE — Progress Notes (Signed)
Per Dr. Pamelia Hoit - okay to proceed with treatment with elevated HR of 112 and echocardiogram from 10/23/22.    Patient declined to stay for 30 minute post-observation period following Herceptin Hylecta injection.  Patient's vital signs retaken prior to discharge and remained within normal parameters.  Patient discharged in stable condition.

## 2023-04-20 NOTE — Patient Instructions (Signed)
[facility]   Discharge Instructions: Thank you for choosing [facility] to provide your oncology and hematology care.   If you have a lab appointment with the Cancer Center, please go directly to the Cancer Center and check in at the registration area.   Wear comfortable clothing and clothing appropriate for easy access to any Portacath or PICC line.   We strive to give you quality time with your provider. You may need to reschedule your appointment if you arrive late (15 or more minutes).  Arriving late affects you and other patients whose appointments are after yours.  Also, if you miss three or more appointments without notifying the office, you may be dismissed from the clinic at the provider's discretion.      For prescription refill requests, have your pharmacy contact our office and allow 72 hours for refills to be completed.    Today you received the following chemotherapy and/or immunotherapy agents: Trastuzumab hyaluronidase (Herceptin hylecta)    To help prevent nausea and vomiting after your treatment, we encourage you to take your nausea medication as directed.  BELOW ARE SYMPTOMS THAT SHOULD BE REPORTED IMMEDIATELY: *FEVER GREATER THAN 100.4 F (38 C) OR HIGHER *CHILLS OR SWEATING *NAUSEA AND VOMITING THAT IS NOT CONTROLLED WITH YOUR NAUSEA MEDICATION *UNUSUAL SHORTNESS OF BREATH *UNUSUAL BRUISING OR BLEEDING *URINARY PROBLEMS (pain or burning when urinating, or frequent urination) *BOWEL PROBLEMS (unusual diarrhea, constipation, pain near the anus) TENDERNESS IN MOUTH AND THROAT WITH OR WITHOUT PRESENCE OF ULCERS (sore throat, sores in mouth, or a toothache) UNUSUAL RASH, SWELLING OR PAIN  UNUSUAL VAGINAL DISCHARGE OR ITCHING   Items with * indicate a potential emergency and should be followed up as soon as possible or go to the Emergency Department if any problems should occur.  Please show the CHEMOTHERAPY ALERT CARD  or IMMUNOTHERAPY ALERT CARD at check-in to the Emergency Department and triage nurse.  Should you have questions after your visit or need to cancel or reschedule your appointment, please contact [place] [facility]  Dept: [phone]  and follow the prompts.  Office hours are 8:00 a.m. to 4:30 p.m. Monday - Friday. Please note that voicemails left after 4:00 p.m. may not be returned until the following business day.  We are closed weekends and major holidays. You have access to a nurse at all times for urgent questions. Please call the main number to the clinic Dept: [phone] and follow the prompts.   For any non-urgent questions, you may also contact your provider using MyChart. We now offer e-Visits for anyone 71 and older to request care online for non-urgent symptoms. For details visit [email or web address].   Also download the MyChart app! Go to the app store, search "MyChart", open the app, select [place], and log in with your MyChart username and password.

## 2023-04-21 NOTE — Radiation Completion Notes (Addendum)
  Radiation Oncology         [phone] ________________________________  Name: Natalie Griffin MRN: [id number]  Date: 04/12/2023  DOB: 1976-01-01  Referring Physician: Charlane Ferretti, M.D. Date of Service: 2023-04-21 Radiation Oncologist: Dorothy Puffer, M.D. [facility]     RADIATION ONCOLOGY END OF TREATMENT NOTE     Diagnosis: 47 yo woman with left hip pain from bone metastases from cancer of the left upper inner breast - Stage IV   Intent: Palliative     ==========DELIVERED PLANS==========  First Treatment Date: 2023-03-23 - Last Treatment Date: 2023-04-12   Plan Name: Pelvis_L Site: Hip, Left Technique: 3D Mode: Photon Dose Per Fraction: 3 Gy Prescribed Dose (Delivered / Prescribed): 30 Gy / 30 Gy Prescribed Fxs (Delivered / Prescribed): 10 / 10   Plan Name: Spine Site: Lumbar Spine Technique: 3D Mode: Photon Dose Per Fraction: 3 Gy Prescribed Dose (Delivered / Prescribed): 3 Gy / 3 Gy Prescribed Fxs (Delivered / Prescribed): 1 / 1   Plan Name: Spine_replan Site: Lumbar Spine Technique: 3D Mode: Photon Dose Per Fraction: 3 Gy Prescribed Dose (Delivered / Prescribed): 27 Gy / 27 Gy Prescribed Fxs (Delivered / Prescribed): 9 / 9     ==========ON TREATMENT VISIT DATES========== 2023-03-26, 2023-04-02, 2023-04-12    See weekly On Treatment Notes in Epic for details.  She tolerated the radiation treatments relatively well with only modest fatigue.  The patient will receive a call in about one month from the radiation oncology department. She will continue follow up with her medical oncologist, Dr. Pamelia Hoit as well.  ------------------------------------------------   Margaretmary Dys, MD [facility]  Radiation Oncology Direct Dial: [phone]  Fax: [phone] [email or web address]  Skype  LinkedIn

## 2023-04-22 ENCOUNTER — Encounter: Payer: Self-pay | Admitting: Hematology and Oncology

## 2023-04-22 NOTE — Progress Notes (Signed)
This encounter was created in error - please disregard.

## 2023-05-04 ENCOUNTER — Encounter: Payer: Self-pay | Admitting: Hematology and Oncology

## 2023-05-04 ENCOUNTER — Telehealth: Payer: Self-pay | Admitting: Hematology and Oncology

## 2023-05-04 ENCOUNTER — Other Ambulatory Visit: Payer: Self-pay | Admitting: Hematology and Oncology

## 2023-05-04 NOTE — Telephone Encounter (Signed)
Scheduled appointments per WQ. Left voicemail with appointment details for the patient.

## 2023-05-06 ENCOUNTER — Other Ambulatory Visit: Payer: Self-pay | Admitting: Hematology and Oncology

## 2023-05-06 ENCOUNTER — Other Ambulatory Visit: Payer: Self-pay

## 2023-05-08 ENCOUNTER — Other Ambulatory Visit: Payer: Self-pay

## 2023-05-11 ENCOUNTER — Inpatient Hospital Stay: Payer: 59 | Attending: Hematology and Oncology | Admitting: Hematology and Oncology

## 2023-05-11 ENCOUNTER — Ambulatory Visit: Payer: 59

## 2023-05-11 ENCOUNTER — Encounter: Payer: Self-pay | Admitting: Hematology and Oncology

## 2023-05-11 DIAGNOSIS — Z5112 Encounter for antineoplastic immunotherapy: Secondary | ICD-10-CM | POA: Insufficient documentation

## 2023-05-11 DIAGNOSIS — Z79811 Long term (current) use of aromatase inhibitors: Secondary | ICD-10-CM | POA: Insufficient documentation

## 2023-05-11 DIAGNOSIS — C50212 Malignant neoplasm of upper-inner quadrant of left female breast: Secondary | ICD-10-CM | POA: Diagnosis not present

## 2023-05-11 DIAGNOSIS — Z170 Estrogen receptor positive status [ER+]: Secondary | ICD-10-CM | POA: Diagnosis not present

## 2023-05-11 DIAGNOSIS — C781 Secondary malignant neoplasm of mediastinum: Secondary | ICD-10-CM | POA: Insufficient documentation

## 2023-05-11 DIAGNOSIS — C50411 Malignant neoplasm of upper-outer quadrant of right female breast: Secondary | ICD-10-CM | POA: Insufficient documentation

## 2023-05-11 DIAGNOSIS — C7951 Secondary malignant neoplasm of bone: Secondary | ICD-10-CM | POA: Insufficient documentation

## 2023-05-11 NOTE — Progress Notes (Signed)
HEMATOLOGY-ONCOLOGY TELEPHONE VISIT PROGRESS NOTE  I connected with our patient on 05/11/23 at 10:30 AM EDT by telephone and verified that I am speaking with the correct person using two identifiers.  I discussed the limitations, risks, security and privacy concerns of performing an evaluation and management service by telephone and the availability of in person appointments.  I also discussed with the patient that there may be a patient responsible charge related to this service. The patient expressed understanding and agreed to proceed.   History of Present Illness: Natalie Griffin is a 47 year old with a history of left breast cancer. Currently on anastrozole and Herceptin. She presents to the clinic for a telephone follow-up.   Oncology History  Malignant neoplasm of upper-inner quadrant of left breast in female, estrogen receptor positive (HCC)  04/11/2020 Initial Diagnosis   Patient palpated a left breast lump. Mammogram on 03/08/20 showed a 3.0cm mass at the 11 o'clock position with no axillary adenopathy. 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    02/2021 Relapse/Recurrence   Patient went to orthopedics for right hip pain.  Imaging revealed bone metastasis.  CT CAP: Cortical destruction of the right femoral neck consistent with skeletal metastases at risk for pathologic fracture.  Lucent lesion in the left iliac bone and L3 vertebral body consistent with multifocal skeletal metastases.   04/01/2021 Surgery   04/01/2021: Femoral intramedullary nail  Pathology  review: Metastatic breast cancer ER 2% PR 0% HER2 3+ positive   04/08/2021 -  Anti-estrogen oral therapy   Tamoxifen 20 mg was prescribed on 04/08/2021 (discontinued by patient because she felt that the risks and benefits did not support it )--resumed in 07/2022 based on pathology results from right breast biopsy   05/14/2021 -  Chemotherapy   Subcutaneous Herceptin Perjeta (Phesgo) injection started 05/14/2021, switched to Herceptin Hylecta 06/11/2021 (Fixed drug eruption, profound diarrhea to Phesgo)     05/20/2021 - 06/03/2021 Radiation Therapy   Palliative radiation to the iliac bone and hip   02/04/2022 Imaging   MRI thoracic spine 02/04/2022: Numerous bone metastases thoracic spine cervical and lumbar spines largest T5, T8, T11.  Chronic compression fractures T4, T5, T10 and T12    05/07/2022 Imaging   CT chest abdomen pelvis: No new or progressive bone metastases.  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   07/29/2022 PET scan   IMPRESSION: 1. Hypermetabolic mass in the posterior deep RIGHT breast consistent primary breast carcinoma. 2. Central nodal mediastinal metastasis to the high LEFT prevascular space and LEFT super clavicular node. 3. 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  [name] recommended changing treatment to Kadcyla and Mee wanted to wait until after the holidays.  She will discuss further with Dr. Pamelia Hoit in January.   11/19/2022 - 11/19/2022 Chemotherapy   Patient is on Treatment Plan : BREAST  Trastuzumab IV (8/6) or SQ (600) D1 q21d     04/20/2023 -  Chemotherapy   Patient is on Treatment Plan : BREAST MAINTENANCE Trastuzumab IV (6) or SQ (600) D1 q21d x 13 cycles       REVIEW OF SYSTEMS:    Constitutional: Denies fevers, chills or abnormal weight loss All other systems were reviewed with the patient and are negative. 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  Patient refused adjuvant chemo, adjuvant radiation and adjuvant antiestrogen therapy.   Patient went to orthopedics for right hip pain.  Imaging revealed bone metastasis.  CT CAP: Cortical destruction of the right femoral neck consistent with skeletal metastases at risk for pathologic fracture.  Lucent lesion in the left iliac bone and L3 vertebral body consistent with multifocal skeletal metastases. 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 Hospitalization 03/24/2023-03/27/2023 increased pain in the back: Vertebral mets and compression fractures: Radiation to lumbar spine completed 04/12/2023   Elevated alkaline phosphatase: Secondary to bone metastases and hypercalcemia.  She does not want to take Xgeva.  And she will not receive infusions and therefore Zometa is out of question.   Current treatment: Herceptin Hylecta every 4 weeks Scans have been planned for 05/21/2023. She receives Herceptin every 4 weeks of the follow-up with me will be towards end of September.  She is undergoing a lumpectomy and so we will review the pathology report from that.  Significant improvement in back pain after radiation: She wants to go back to work and we will renew her license and potentially start working 06/22/2023.  She does me that she is hardly using breakthrough pain medication is just on the long-term pain medication only.   I provided 12 minutes of non-face-to-face time during this encounter.  This includes time for charting and coordination of care   Tamsen Meek, MD  I Janan Ridge am acting as a scribe for Dr.Rudi Knippenberg  I have reviewed the above documentation for accuracy and completeness, and I agree with the above.

## 2023-05-11 NOTE — Assessment & Plan Note (Signed)
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  Patient refused adjuvant chemo, adjuvant radiation and adjuvant antiestrogen therapy.   Patient went to orthopedics for right hip pain.  Imaging revealed bone metastasis.  CT CAP: Cortical destruction of the right femoral neck consistent with skeletal metastases at risk for pathologic fracture.  Lucent lesion in the left iliac bone and L3 vertebral body consistent with multifocal skeletal metastases. 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 Hospitalization 03/24/2023-03/27/2023 increased pain in the back:  Vertebral mets and compression fractures: Radiation to lumbar spine completed 04/12/2023   Elevated alkaline phosphatase: Secondary to bone metastases and hypercalcemia.  She does not want to take Xgeva.  And she will not receive infusions and therefore Zometa is out of question.   Current treatment: Herceptin Hylecta every 4 weeks

## 2023-05-17 ENCOUNTER — Other Ambulatory Visit: Payer: Self-pay | Admitting: Hematology and Oncology

## 2023-05-18 ENCOUNTER — Other Ambulatory Visit (HOSPITAL_COMMUNITY): Payer: Self-pay

## 2023-05-18 ENCOUNTER — Telehealth: Payer: Self-pay

## 2023-05-18 MED ORDER — MORPHINE SULFATE ER 30 MG PO TBCR
60.0000 mg | EXTENDED_RELEASE_TABLET | Freq: Two times a day (BID) | ORAL | 0 refills | Status: DC
Start: 2023-05-18 — End: 2023-06-08
  Filled 2023-05-18: qty 60, 15d supply, fill #0
  Filled 2023-05-20: qty 60, 20d supply, fill #0
  Filled 2023-05-20: qty 60, 15d supply, fill #0

## 2023-05-18 NOTE — Telephone Encounter (Signed)
Spoke to patient informing her that her [employer] FMLA documents had been completed and faxed to company. Fax conformation received. Copy of documents mailed to patient as requested

## 2023-05-19 ENCOUNTER — Encounter: Payer: Self-pay | Admitting: Hematology and Oncology

## 2023-05-19 ENCOUNTER — Other Ambulatory Visit (HOSPITAL_COMMUNITY): Payer: Self-pay

## 2023-05-20 ENCOUNTER — Other Ambulatory Visit (HOSPITAL_COMMUNITY): Payer: Self-pay

## 2023-05-20 ENCOUNTER — Inpatient Hospital Stay: Payer: 59

## 2023-05-20 ENCOUNTER — Other Ambulatory Visit: Payer: Self-pay | Admitting: *Deleted

## 2023-05-20 VITALS — BP 116/81 | HR 94 | Temp 98.6°F | Resp 18 | Ht 65.0 in | Wt 148.0 lb

## 2023-05-20 DIAGNOSIS — C7951 Secondary malignant neoplasm of bone: Secondary | ICD-10-CM | POA: Diagnosis not present

## 2023-05-20 DIAGNOSIS — C50212 Malignant neoplasm of upper-inner quadrant of left female breast: Secondary | ICD-10-CM

## 2023-05-20 DIAGNOSIS — Z170 Estrogen receptor positive status [ER+]: Secondary | ICD-10-CM | POA: Diagnosis not present

## 2023-05-20 DIAGNOSIS — Z5112 Encounter for antineoplastic immunotherapy: Secondary | ICD-10-CM | POA: Diagnosis not present

## 2023-05-20 DIAGNOSIS — R892 Abnormal level of other drugs, medicaments and biological substances in specimens from other organs, systems and tissues: Secondary | ICD-10-CM

## 2023-05-20 DIAGNOSIS — C50411 Malignant neoplasm of upper-outer quadrant of right female breast: Secondary | ICD-10-CM | POA: Diagnosis not present

## 2023-05-20 DIAGNOSIS — C781 Secondary malignant neoplasm of mediastinum: Secondary | ICD-10-CM | POA: Diagnosis not present

## 2023-05-20 DIAGNOSIS — Z79811 Long term (current) use of aromatase inhibitors: Secondary | ICD-10-CM | POA: Diagnosis not present

## 2023-05-20 MED ORDER — TRASTUZUMAB-HYALURONIDASE-OYSK 600-10000 MG-UNT/5ML ~~LOC~~ SOLN
600.0000 mg | Freq: Once | SUBCUTANEOUS | Status: AC
  Administered 2023-05-20: 600 mg via SUBCUTANEOUS
  Filled 2023-05-20: qty 5

## 2023-05-20 NOTE — Progress Notes (Signed)
Per Dr. Al Pimple, ok for treatment today with ECHO from 10/23/22 50-55%. Pt. declined to stay for 30 minute post observation as she has had this medication in the past. Vital signs stable, left via ambulation, and no shortness of breath noted.

## 2023-05-20 NOTE — Patient Instructions (Signed)
 [facility]  Discharge Instructions: Thank you for choosing [facility] to provide your oncology and hematology care.   If you have a lab appointment with the Cancer Center, please go directly to the Cancer Center and check in at the registration area.   Wear comfortable clothing and clothing appropriate for easy access to any Portacath or PICC line.   We strive to give you quality time with your provider. You may need to reschedule your appointment if you arrive late (15 or more minutes).  Arriving late affects you and other patients whose appointments are after yours.  Also, if you miss three or more appointments without notifying the office, you may be dismissed from the clinic at the provider's discretion.      For prescription refill requests, have your pharmacy contact our office and allow 72 hours for refills to be completed.    Today you received the following chemotherapy and/or immunotherapy agent: Trastuzumab Hyaluronidase (Herceptin Hylecta)   To help prevent nausea and vomiting after your treatment, we encourage you to take your nausea medication as directed.  BELOW ARE SYMPTOMS THAT SHOULD BE REPORTED IMMEDIATELY: *FEVER GREATER THAN 100.4 F (38 C) OR HIGHER *CHILLS OR SWEATING *NAUSEA AND VOMITING THAT IS NOT CONTROLLED WITH YOUR NAUSEA MEDICATION *UNUSUAL SHORTNESS OF BREATH *UNUSUAL BRUISING OR BLEEDING *URINARY PROBLEMS (pain or burning when urinating, or frequent urination) *BOWEL PROBLEMS (unusual diarrhea, constipation, pain near the anus) TENDERNESS IN MOUTH AND THROAT WITH OR WITHOUT PRESENCE OF ULCERS (sore throat, sores in mouth, or a toothache) UNUSUAL RASH, SWELLING OR PAIN  UNUSUAL VAGINAL DISCHARGE OR ITCHING   Items with * indicate a potential emergency and should be followed up as soon as possible or go to the Emergency Department if any problems should occur.  Please show the CHEMOTHERAPY ALERT CARD or  IMMUNOTHERAPY ALERT CARD at check-in to the Emergency Department and triage nurse.  Should you have questions after your visit or need to cancel or reschedule your appointment, please contact [facility]  Dept: [phone]  and follow the prompts.  Office hours are 8:00 a.m. to 4:30 p.m. Monday - Friday. Please note that voicemails left after 4:00 p.m. may not be returned until the following business day.  We are closed weekends and major holidays. You have access to a nurse at all times for urgent questions. Please call the main number to the clinic Dept: [phone] and follow the prompts.   For any non-urgent questions, you may also contact your provider using MyChart. We now offer e-Visits for anyone 6 and older to request care online for non-urgent symptoms. For details visit [email or web address].   Also download the MyChart app! Go to the app store, search "MyChart", open the app, select [facility], and log in with your MyChart username and password.  Trastuzumab; Hyaluronidase Injection What is this medication? TRASTUZUMAB; HYALURONIDASE (tras TOO zoo mab; hye al ur ON i dase) treats breast cancer. Trastuzumab works by blocking a protein that causes cancer cells to grow and multiply. This helps to slow or stop the spread of cancer cells. Hyaluronidase works by increasing the absorption of other medications in the body to help them work better. It is a combination medication that contains a monoclonal antibody. This medicine may be used for other purposes; ask your health care provider or pharmacist if you have questions. COMMON BRAND NAME(S): HERCEPTIN HYLECTA What should I tell my care team before  I take this medication? They need to know if you have any of these conditions: Heart failure Lung disease An unusual or allergic reaction to trastuzumab, or other medications, foods, dyes, or preservatives Pregnant or trying to get  pregnant Breast-feeding How should I use this medication? This medication is injected under the skin. It is given by your care team in a hospital or clinic setting. Talk to your care team about the use of this medication in children. It is not approved for use in children. Overdosage: If you think you have taken too much of this medicine contact a poison control center or emergency room at once. NOTE: This medicine is only for you. Do not share this medicine with others. What if I miss a dose? Keep appointments for follow-up doses. It is important not to miss your dose. Call your care team if you are unable to keep an appointment. What may interact with this medication? Certain types of chemotherapy, such as daunorubicin, doxorubicin, epirubicin, idarubicin This list may not describe all possible interactions. Give your health care provider a list of all the medicines, herbs, non-prescription drugs, or dietary supplements you use. Also tell them if you smoke, drink alcohol, or use illegal drugs. Some items may interact with your medicine. What should I watch for while using this medication? Your condition will be monitored carefully while you are receiving this medication. This medication may make you feel generally unwell. This is not uncommon as chemotherapy can affect healthy cells as well as cancer cells. Report any side effects. Continue your course of treatment even though you feel ill unless your care team tells you to stop. This medication may increase your risk of getting an infection. Call your care team for advice if you get a fever, chills, sore throat, or other symptoms of a cold or flu. Do not treat yourself. Try to avoid being around people who are sick. Avoid taking medications that contain aspirin, acetaminophen, ibuprofen, naproxen, or ketoprofen unless instructed by your care team. These medications may hide a fever. Talk to your care team if you may be pregnant. Serious birth  defects can occur if you take this medication during pregnancy and for 7 months after the last dose. You will need a negative pregnancy test before starting this medication. Contraception is recommended while taking this medication and for 7 months after the last dose. Your care team can help you find the option that works for you. Do not breastfeed while taking this medication or for 7 months after the last dose. What side effects may I notice from receiving this medication? Side effects that you should report to your care team as soon as possible: Allergic reactions or angioedema--skin rash, itching or hives, swelling of the face, eyes, lips, tongue, arms, or legs, trouble swallowing or breathing Dry cough, shortness of breath or trouble breathing Heart failure--shortness of breath, swelling of the ankles, feet, or hands, sudden weight gain, unusual weakness or fatigue Heart rhythm changes--fast or irregular heartbeat, dizziness, feeling faint or lightheaded, chest pain, trouble breathing Increase in blood pressure Infection--fever, chills, cough, or sore throat Side effects that usually do not require medical attention (report these to your care team if they continue or are bothersome): Diarrhea Hair loss Headache Muscle pain Nausea Unusual weakness or fatigue This list may not describe all possible side effects. Call your doctor for medical advice about side effects. You may report side effects to FDA at 1-800-FDA-1088. Where should I keep my medication? This medication  is given in a hospital or clinic. It will not be stored at home. NOTE: This sheet is a summary. It may not cover all possible information. If you have questions about this medicine, talk to your doctor, pharmacist, or health care provider.  2024 Elsevier/Gold Standard (2022-01-20 00:00:00)

## 2023-05-21 ENCOUNTER — Ambulatory Visit (HOSPITAL_COMMUNITY)
Admission: RE | Admit: 2023-05-21 | Discharge: 2023-05-21 | Disposition: A | Discharge status: Home / Self Care | Payer: 59 | Source: Ambulatory Visit | Attending: Hematology and Oncology | Admitting: Hematology and Oncology

## 2023-05-21 ENCOUNTER — Encounter (HOSPITAL_COMMUNITY): Payer: Self-pay | Admitting: Radiology

## 2023-05-21 DIAGNOSIS — N6311 Unspecified lump in the right breast, upper outer quadrant: Secondary | ICD-10-CM | POA: Diagnosis not present

## 2023-05-21 DIAGNOSIS — N200 Calculus of kidney: Secondary | ICD-10-CM | POA: Diagnosis not present

## 2023-05-21 DIAGNOSIS — C50212 Malignant neoplasm of upper-inner quadrant of left female breast: Secondary | ICD-10-CM | POA: Diagnosis not present

## 2023-05-21 DIAGNOSIS — Z170 Estrogen receptor positive status [ER+]: Secondary | ICD-10-CM

## 2023-05-21 DIAGNOSIS — K760 Fatty (change of) liver, not elsewhere classified: Secondary | ICD-10-CM | POA: Diagnosis not present

## 2023-05-21 MED ORDER — IOHEXOL 300 MG/ML  SOLN
100.0000 mL | Freq: Once | INTRAMUSCULAR | Status: AC | PRN
  Administered 2023-05-21: 100 mL via INTRAVENOUS

## 2023-05-21 MED ORDER — SODIUM CHLORIDE (PF) 0.9 % IJ SOLN
INTRAMUSCULAR | Status: AC
  Filled 2023-05-21: qty 50

## 2023-05-25 ENCOUNTER — Encounter: Payer: Self-pay | Admitting: Hematology and Oncology

## 2023-05-25 ENCOUNTER — Ambulatory Visit
Admission: RE | Admit: 2023-05-25 | Discharge: 2023-05-25 | Disposition: A | Discharge status: Home / Self Care | Payer: 59 | Source: Ambulatory Visit | Attending: Radiation Oncology | Admitting: Radiation Oncology

## 2023-05-25 NOTE — Progress Notes (Signed)
  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 Voicemail left.  The patient is NOT scheduled for ongoing care with Dr. Pamelia Hoit  in medical oncology. The patient was encouraged to call if she develops concerns or questions regarding radiation.   This concludes the interaction.  Ruel Favors, LPN

## 2023-05-28 ENCOUNTER — Other Ambulatory Visit: Payer: Self-pay

## 2023-06-01 ENCOUNTER — Ambulatory Visit (HOSPITAL_COMMUNITY): Admission: RE | Admit: 2023-06-01 | Payer: 59 | Source: Ambulatory Visit

## 2023-06-02 ENCOUNTER — Encounter (HOSPITAL_BASED_OUTPATIENT_CLINIC_OR_DEPARTMENT_OTHER): Payer: Self-pay | Admitting: Surgery

## 2023-06-02 ENCOUNTER — Other Ambulatory Visit (HOSPITAL_COMMUNITY): Payer: Self-pay

## 2023-06-02 ENCOUNTER — Other Ambulatory Visit: Payer: Self-pay

## 2023-06-02 ENCOUNTER — Encounter: Payer: Self-pay | Admitting: Hematology and Oncology

## 2023-06-02 ENCOUNTER — Ambulatory Visit: Payer: 59

## 2023-06-02 MED ORDER — CHLORHEXIDINE GLUCONATE CLOTH 2 % EX PADS: 6.0000 | MEDICATED_PAD | Freq: Once | CUTANEOUS | Status: DC

## 2023-06-02 MED ORDER — AMPHETAMINE-DEXTROAMPHETAMINE 10 MG PO TABS
5.0000 mg | ORAL_TABLET | Freq: Two times a day (BID) | ORAL | 0 refills | Status: DC | PRN
Start: 2023-06-02 — End: 2023-06-17
  Filled 2023-06-02: qty 60, 30d supply, fill #0

## 2023-06-02 NOTE — Progress Notes (Signed)

## 2023-06-06 ENCOUNTER — Other Ambulatory Visit: Payer: Self-pay

## 2023-06-07 ENCOUNTER — Ambulatory Visit (HOSPITAL_COMMUNITY)
Admission: RE | Admit: 2023-06-07 | Discharge: 2023-06-07 | Disposition: A | Discharge status: Home / Self Care | Payer: 59 | Source: Ambulatory Visit | Attending: Hematology and Oncology

## 2023-06-07 DIAGNOSIS — Z170 Estrogen receptor positive status [ER+]: Secondary | ICD-10-CM | POA: Diagnosis not present

## 2023-06-07 DIAGNOSIS — R892 Abnormal level of other drugs, medicaments and biological substances in specimens from other organs, systems and tissues: Secondary | ICD-10-CM

## 2023-06-07 DIAGNOSIS — I349 Nonrheumatic mitral valve disorder, unspecified: Secondary | ICD-10-CM | POA: Insufficient documentation

## 2023-06-07 DIAGNOSIS — Z0189 Encounter for other specified special examinations: Secondary | ICD-10-CM | POA: Diagnosis not present

## 2023-06-07 DIAGNOSIS — C50212 Malignant neoplasm of upper-inner quadrant of left female breast: Secondary | ICD-10-CM

## 2023-06-07 LAB — ECHOCARDIOGRAM COMPLETE
Area-P 1/2: 4.31 cm2
Calc EF: 56.1 %
Est EF: 55
S' Lateral: 3.5 cm
Single Plane A2C EF: 58 %
Single Plane A4C EF: 55 %

## 2023-06-07 NOTE — Progress Notes (Signed)
  Echocardiogram 2D Echocardiogram has been performed.  Janalyn Harder 06/07/2023, 9:49 AM

## 2023-06-08 ENCOUNTER — Encounter: Payer: Self-pay | Admitting: Hematology and Oncology

## 2023-06-08 ENCOUNTER — Other Ambulatory Visit (HOSPITAL_COMMUNITY): Payer: Self-pay

## 2023-06-08 ENCOUNTER — Ambulatory Visit
Admission: RE | Admit: 2023-06-08 | Discharge: 2023-06-08 | Disposition: A | Discharge status: Home / Self Care | Payer: 59 | Source: Ambulatory Visit | Attending: Surgery | Admitting: Surgery

## 2023-06-08 ENCOUNTER — Other Ambulatory Visit: Payer: Self-pay | Admitting: Hematology and Oncology

## 2023-06-08 DIAGNOSIS — C50911 Malignant neoplasm of unspecified site of right female breast: Secondary | ICD-10-CM

## 2023-06-08 HISTORY — PX: BREAST BIOPSY: SHX20

## 2023-06-08 MED ORDER — MORPHINE SULFATE ER 60 MG PO TBCR
60.0000 mg | EXTENDED_RELEASE_TABLET | Freq: Two times a day (BID) | ORAL | 0 refills | Status: DC
Start: 2023-06-08 — End: 2023-08-11
  Filled 2023-06-08: qty 120, 60d supply, fill #0

## 2023-06-09 ENCOUNTER — Ambulatory Visit (HOSPITAL_BASED_OUTPATIENT_CLINIC_OR_DEPARTMENT_OTHER): Payer: 59 | Admitting: Anesthesiology

## 2023-06-09 ENCOUNTER — Other Ambulatory Visit: Payer: Self-pay

## 2023-06-09 ENCOUNTER — Ambulatory Visit (HOSPITAL_BASED_OUTPATIENT_CLINIC_OR_DEPARTMENT_OTHER)
Admission: RE | Admit: 2023-06-09 | Discharge: 2023-06-09 | Disposition: A | Discharge status: Home / Self Care | Payer: 59 | Attending: Surgery | Admitting: Surgery

## 2023-06-09 ENCOUNTER — Ambulatory Visit
Admission: RE | Admit: 2023-06-09 | Discharge: 2023-06-09 | Disposition: A | Discharge status: Home / Self Care | Payer: 59 | Source: Ambulatory Visit | Attending: Surgery | Admitting: Surgery

## 2023-06-09 ENCOUNTER — Other Ambulatory Visit (HOSPITAL_COMMUNITY): Payer: Self-pay

## 2023-06-09 ENCOUNTER — Encounter (HOSPITAL_BASED_OUTPATIENT_CLINIC_OR_DEPARTMENT_OTHER)
Admission: RE | Disposition: A | Discharge status: Home / Self Care | Payer: Self-pay | Source: Home / Self Care | Attending: Surgery

## 2023-06-09 ENCOUNTER — Encounter (HOSPITAL_BASED_OUTPATIENT_CLINIC_OR_DEPARTMENT_OTHER): Payer: Self-pay | Admitting: Surgery

## 2023-06-09 DIAGNOSIS — C7951 Secondary malignant neoplasm of bone: Secondary | ICD-10-CM | POA: Diagnosis not present

## 2023-06-09 DIAGNOSIS — Z87891 Personal history of nicotine dependence: Secondary | ICD-10-CM | POA: Insufficient documentation

## 2023-06-09 DIAGNOSIS — C50911 Malignant neoplasm of unspecified site of right female breast: Secondary | ICD-10-CM | POA: Diagnosis not present

## 2023-06-09 DIAGNOSIS — Z170 Estrogen receptor positive status [ER+]: Secondary | ICD-10-CM

## 2023-06-09 DIAGNOSIS — Z9012 Acquired absence of left breast and nipple: Secondary | ICD-10-CM | POA: Insufficient documentation

## 2023-06-09 DIAGNOSIS — C50411 Malignant neoplasm of upper-outer quadrant of right female breast: Secondary | ICD-10-CM | POA: Diagnosis not present

## 2023-06-09 DIAGNOSIS — C50212 Malignant neoplasm of upper-inner quadrant of left female breast: Secondary | ICD-10-CM

## 2023-06-09 HISTORY — PX: BREAST LUMPECTOMY WITH RADIOACTIVE SEED LOCALIZATION: SHX6424

## 2023-06-09 HISTORY — DX: Other complications of anesthesia, initial encounter: T88.59XA

## 2023-06-09 HISTORY — DX: Other disorders of plasma-protein metabolism, not elsewhere classified: E88.09

## 2023-06-09 SURGERY — BREAST LUMPECTOMY WITH RADIOACTIVE SEED LOCALIZATION
Anesthesia: General | Laterality: Right

## 2023-06-09 MED ORDER — HYDROMORPHONE HCL 1 MG/ML IJ SOLN
0.2500 mg | INTRAMUSCULAR | Status: DC | PRN
  Administered 2023-06-09 (×2): 0.5 mg via INTRAVENOUS

## 2023-06-09 MED ORDER — HYDROMORPHONE HCL 1 MG/ML IJ SOLN
INTRAMUSCULAR | Status: AC
  Filled 2023-06-09: qty 0.5

## 2023-06-09 MED ORDER — ACETAMINOPHEN 500 MG PO TABS
ORAL_TABLET | ORAL | Status: AC
  Filled 2023-06-09: qty 2

## 2023-06-09 MED ORDER — DEXAMETHASONE SODIUM PHOSPHATE 10 MG/ML IJ SOLN
INTRAMUSCULAR | Status: DC | PRN
  Administered 2023-06-09: 5 mg via INTRAVENOUS

## 2023-06-09 MED ORDER — OXYCODONE HCL 5 MG/5ML PO SOLN: 5.0000 mg | Freq: Once | ORAL | Status: AC | PRN

## 2023-06-09 MED ORDER — ONDANSETRON HCL 4 MG/2ML IJ SOLN
INTRAMUSCULAR | Status: AC
  Filled 2023-06-09: qty 2

## 2023-06-09 MED ORDER — ONDANSETRON HCL 4 MG/2ML IJ SOLN
INTRAMUSCULAR | Status: DC | PRN
  Administered 2023-06-09: 4 mg via INTRAVENOUS

## 2023-06-09 MED ORDER — LIDOCAINE 2% (20 MG/ML) 5 ML SYRINGE
INTRAMUSCULAR | Status: DC | PRN
  Administered 2023-06-09: 100 mg via INTRAVENOUS

## 2023-06-09 MED ORDER — BUPIVACAINE-EPINEPHRINE (PF) 0.25% -1:200000 IJ SOLN
INTRAMUSCULAR | Status: DC | PRN
  Administered 2023-06-09: 20 mL

## 2023-06-09 MED ORDER — PROPOFOL 10 MG/ML IV BOLUS
INTRAVENOUS | Status: DC | PRN
  Administered 2023-06-09: 200 mg via INTRAVENOUS

## 2023-06-09 MED ORDER — SODIUM CHLORIDE 0.9 % IV SOLN
INTRAVENOUS | Status: AC
  Filled 2023-06-09: qty 10

## 2023-06-09 MED ORDER — MIDAZOLAM HCL 5 MG/5ML IJ SOLN
INTRAMUSCULAR | Status: DC | PRN
  Administered 2023-06-09: 2 mg via INTRAVENOUS

## 2023-06-09 MED ORDER — CEFAZOLIN SODIUM-DEXTROSE 2-4 GM/100ML-% IV SOLN
2.0000 g | INTRAVENOUS | Status: AC
  Administered 2023-06-09: 2 g via INTRAVENOUS

## 2023-06-09 MED ORDER — PROPOFOL 10 MG/ML IV BOLUS
INTRAVENOUS | Status: AC
  Filled 2023-06-09: qty 20

## 2023-06-09 MED ORDER — OXYCODONE HCL 5 MG PO TABS
5.0000 mg | ORAL_TABLET | Freq: Four times a day (QID) | ORAL | 0 refills | Status: DC | PRN
  Filled 2023-06-09: qty 15, 4d supply, fill #0

## 2023-06-09 MED ORDER — BUPIVACAINE-EPINEPHRINE (PF) 0.25% -1:200000 IJ SOLN
INTRAMUSCULAR | Status: AC
  Filled 2023-06-09: qty 30

## 2023-06-09 MED ORDER — LACTATED RINGERS IV SOLN: INTRAVENOUS | Status: DC

## 2023-06-09 MED ORDER — DEXAMETHASONE SODIUM PHOSPHATE 10 MG/ML IJ SOLN
INTRAMUSCULAR | Status: AC
  Filled 2023-06-09: qty 1

## 2023-06-09 MED ORDER — EPHEDRINE SULFATE-NACL 50-0.9 MG/10ML-% IV SOSY
PREFILLED_SYRINGE | INTRAVENOUS | Status: DC | PRN
  Administered 2023-06-09: 10 mg via INTRAVENOUS
  Administered 2023-06-09 (×3): 5 mg via INTRAVENOUS

## 2023-06-09 MED ORDER — ACETAMINOPHEN 500 MG PO TABS
1000.0000 mg | ORAL_TABLET | ORAL | Status: AC
  Administered 2023-06-09: 1000 mg via ORAL

## 2023-06-09 MED ORDER — OXYCODONE HCL 5 MG PO TABS
ORAL_TABLET | ORAL | Status: AC
  Filled 2023-06-09: qty 1

## 2023-06-09 MED ORDER — CEFAZOLIN SODIUM-DEXTROSE 2-4 GM/100ML-% IV SOLN
INTRAVENOUS | Status: AC
  Filled 2023-06-09: qty 100

## 2023-06-09 MED ORDER — FENTANYL CITRATE (PF) 250 MCG/5ML IJ SOLN
INTRAMUSCULAR | Status: DC | PRN
  Administered 2023-06-09: 100 ug via INTRAVENOUS

## 2023-06-09 MED ORDER — EPHEDRINE 5 MG/ML INJ
INTRAVENOUS | Status: AC
  Filled 2023-06-09: qty 5

## 2023-06-09 MED ORDER — LIDOCAINE 2% (20 MG/ML) 5 ML SYRINGE
INTRAMUSCULAR | Status: AC
  Filled 2023-06-09: qty 5

## 2023-06-09 MED ORDER — MIDAZOLAM HCL 2 MG/2ML IJ SOLN
INTRAMUSCULAR | Status: AC
  Filled 2023-06-09: qty 2

## 2023-06-09 MED ORDER — SODIUM CHLORIDE 0.9 % IV SOLN
INTRAVENOUS | Status: DC | PRN
  Administered 2023-06-09: 500 mL

## 2023-06-09 MED ORDER — FENTANYL CITRATE (PF) 100 MCG/2ML IJ SOLN
INTRAMUSCULAR | Status: AC
  Filled 2023-06-09: qty 2

## 2023-06-09 MED ORDER — OXYCODONE HCL 5 MG PO TABS
5.0000 mg | ORAL_TABLET | Freq: Once | ORAL | Status: AC | PRN
  Administered 2023-06-09: 5 mg via ORAL

## 2023-06-09 MED ORDER — ONDANSETRON HCL 4 MG/2ML IJ SOLN: 4.0000 mg | Freq: Once | INTRAMUSCULAR | Status: DC | PRN

## 2023-06-09 SURGICAL SUPPLY — 50 items
ADH SKN CLS APL DERMABOND .7 (GAUZE/BANDAGES/DRESSINGS) ×1
APL PRP STRL LF DISP 70% ISPRP (MISCELLANEOUS) ×1
APPLIER CLIP 9.375 MED OPEN (MISCELLANEOUS)
APR CLP MED 9.3 20 MLT OPN (MISCELLANEOUS)
BAG DECANTER FOR FLEXI CONT (MISCELLANEOUS) IMPLANT
BINDER BREAST LRG (GAUZE/BANDAGES/DRESSINGS) IMPLANT
BINDER BREAST MEDIUM (GAUZE/BANDAGES/DRESSINGS) IMPLANT
BINDER BREAST XLRG (GAUZE/BANDAGES/DRESSINGS) IMPLANT
BINDER BREAST XXLRG (GAUZE/BANDAGES/DRESSINGS) IMPLANT
BLADE SURG 15 STRL LF DISP TIS (BLADE) ×1 IMPLANT
BLADE SURG 15 STRL SS (BLADE) ×1
CANISTER SUC SOCK COL 7IN (MISCELLANEOUS) IMPLANT
CANISTER SUCT 1200ML W/VALVE (MISCELLANEOUS) IMPLANT
CHLORAPREP W/TINT 26 (MISCELLANEOUS) ×1 IMPLANT
CLIP APPLIE 9.375 MED OPEN (MISCELLANEOUS) IMPLANT
COVER BACK TABLE 60X90IN (DRAPES) ×1 IMPLANT
COVER MAYO STAND STRL (DRAPES) ×1 IMPLANT
COVER PROBE CYLINDRICAL 5X96 (MISCELLANEOUS) ×1 IMPLANT
DERMABOND ADVANCED .7 DNX12 (GAUZE/BANDAGES/DRESSINGS) ×1 IMPLANT
DRAPE LAPAROSCOPIC ABDOMINAL (DRAPES) IMPLANT
DRAPE LAPAROTOMY 100X72 PEDS (DRAPES) ×1 IMPLANT
DRAPE UTILITY XL STRL (DRAPES) ×1 IMPLANT
ELECT COATED BLADE 2.86 ST (ELECTRODE) ×1 IMPLANT
ELECT REM PT RETURN 9FT ADLT (ELECTROSURGICAL) ×1
ELECTRODE REM PT RTRN 9FT ADLT (ELECTROSURGICAL) ×1 IMPLANT
GLOVE BIOGEL PI IND STRL 8 (GLOVE) ×1 IMPLANT
GLOVE ECLIPSE 8.0 STRL XLNG CF (GLOVE) ×1 IMPLANT
GOWN STRL REUS W/ TWL LRG LVL3 (GOWN DISPOSABLE) ×2 IMPLANT
GOWN STRL REUS W/ TWL XL LVL3 (GOWN DISPOSABLE) ×1 IMPLANT
GOWN STRL REUS W/TWL LRG LVL3 (GOWN DISPOSABLE) ×2
GOWN STRL REUS W/TWL XL LVL3 (GOWN DISPOSABLE) ×1
HEMOSTAT ARISTA ABSORB 3G PWDR (HEMOSTASIS) IMPLANT
HEMOSTAT SNOW SURGICEL 2X4 (HEMOSTASIS) IMPLANT
KIT MARKER MARGIN INK (KITS) ×1 IMPLANT
NDL HYPO 25X1 1.5 SAFETY (NEEDLE) ×1 IMPLANT
NEEDLE HYPO 25X1 1.5 SAFETY (NEEDLE) ×1
NS IRRIG 1000ML POUR BTL (IV SOLUTION) ×1 IMPLANT
PACK BASIN DAY SURGERY FS (CUSTOM PROCEDURE TRAY) ×1 IMPLANT
PENCIL SMOKE EVACUATOR (MISCELLANEOUS) ×1 IMPLANT
SLEEVE SCD COMPRESS KNEE MED (STOCKING) ×1 IMPLANT
SPIKE FLUID TRANSFER (MISCELLANEOUS) IMPLANT
SPONGE T-LAP 4X18 ~~LOC~~+RFID (SPONGE) ×1 IMPLANT
SUT MNCRL AB 4-0 PS2 18 (SUTURE) ×1 IMPLANT
SUT SILK 2 0 SH (SUTURE) IMPLANT
SUT VICRYL 3-0 CR8 SH (SUTURE) ×1 IMPLANT
SYR CONTROL 10ML LL (SYRINGE) ×1 IMPLANT
TOWEL GREEN STERILE FF (TOWEL DISPOSABLE) ×1 IMPLANT
TRAY FAXITRON CT DISP (TRAY / TRAY PROCEDURE) ×1 IMPLANT
TUBE CONNECTING 20X1/4 (TUBING) IMPLANT
YANKAUER SUCT BULB TIP NO VENT (SUCTIONS) IMPLANT

## 2023-06-09 NOTE — H&P (Signed)
History of Present Illness: Natalie [facility] is a 47 y.o. female who is seen today as an office consultation for evaluation of New Consultation  Patient presents for evaluation of right breast cancer. She has a history of left breast cancer status post breast conserving surgery in 2021. She developed a new breast cancer on the right but also has distant metastatic disease to her bones. She has been on adjuvant therapy and now is getting radiation therapy to her spine. Overall, she has done fairly well considering her condition. The mass in her right breast was diagnosed in 23 on follow-up mammogram. He has gotten larger and she desires excision of this mass due to pain.  Review of Systems: A complete review of systems was obtained from the patient. I have reviewed this information and discussed as appropriate with the patient. See HPI as well for other ROS.    Medical History: Past Medical History:  Diagnosis Date  Anemia  History of cancer   There is no problem list on file for this patient.  Past Surgical History:  Procedure Laterality Date  MASTECTOMY PARTIAL / LUMPECTOMY Left 2021    No Known Allergies  Current Outpatient Medications on File Prior to Visit  Medication Sig Dispense Refill  anastrozole (ARIMIDEX) 1 mg tablet 1 tablet Orally Once a day  methocarbamoL (ROBAXIN) 500 MG tablet  morphine (MSIR) 30 MG immediate release tablet 1 tablet as needed Orally every 12 hrs   No current facility-administered medications on file prior to visit.   Family History  Problem Relation Age of Onset  Diabetes Father  Hyperlipidemia (Elevated cholesterol) Sister  Diabetes Sister    Social History   Tobacco Use  Smoking Status Former  Types: Cigarettes  Start date: 2021  Smokeless Tobacco Never    Social History   Socioeconomic History  Marital status: Single  Tobacco Use  Smoking status: Former  Types: Cigarettes  Start date: 2021  Smokeless tobacco: Never   Substance and Sexual Activity  Alcohol use: Never  Drug use: Never   Social Determinants of Health   Food Insecurity: No Food Insecurity (03/24/2023)  Received from [facility]  Hunger Vital Sign  Worried About Running Out of Food in the Last Year: Never true  Ran Out of Food in the Last Year: Never true  Transportation Needs: No Transportation Needs (03/24/2023)  Received from [facility] - [facility]  Lack of Transportation (Medical): No  Lack of Transportation (Non-Medical): No   Objective:   Vitals:  04/05/23 1349  BP: 115/72  Pulse: 85  Temp: 37.1 C (98.7 F)  SpO2: 98%  Weight: 71.7 kg (158 lb)  Height: 164.5 cm (5' 4.75")  PainSc: 4  PainLoc: Breast   Body mass index is 26.5 kg/m.  Physical Exam Exam conducted with a chaperone present.  HENT:  Head: Normocephalic.  Cardiovascular:  Rate and Rhythm: Normal rate.  Chest:   Comments: MASS right breast 2 cm   Musculoskeletal:  General: Normal range of motion.  Skin: General: Skin is warm.  Neurological:  General: No focal deficit present.  Mental Status: She is alert.     Labs, Imaging and Diagnostic Testing: LINICAL DATA: Increased size of a mass on CT representing invasive ductal carcinoma and DCIS in the 10 o'clock position of the right breast on ultrasound-guided core needle biopsy dated 07/21/2022. The CT was dated 11/18/2022. Status post left lumpectomy for breast cancer in 2021. Bone metastases. She is currently undergoing antiestrogen therapy.  EXAM:  DIGITAL DIAGNOSTIC UNILATERAL RIGHT MAMMOGRAM WITH TOMOSYNTHESIS; ULTRASOUND RIGHT BREAST LIMITED  TECHNIQUE: Right digital diagnostic mammography and breast tomosynthesis was performed.; Targeted ultrasound examination of the right breast was performed  COMPARISON: Previous exam(s).  ACR Breast Density Category c: The breasts are heterogeneously dense, which may obscure small masses.  FINDINGS: The biopsy-proven  malignancy containing a biopsy marker clip in the upper-outer right breast appears slightly larger and more confluent in the oblique projection compared to the previous examination dated 07/21/2022, currently measuring 2.0 cm in maximum diameter. This measured approximately 1.6 cm in maximum diameter mammographically on 07/15/2022. It is difficult to compare the size in the craniocaudal projection due to obscured margins. No interval findings elsewhere in the right breast suspicious for malignancy.  Targeted ultrasound is performed, showing a 1.9 x 1.9 x 1.8 cm irregular, hypoechoic mass with ill-defined surrounding increased echogenicity and indistinct margins in the 10 o'clock position of the right breast, 5 cm from the nipple. This contains a biopsy marker clip, corresponding to the patient's known malignancy. This measured 1.8 x 1.6 x 1.4 cm on 07/15/2022.  Ultrasound of the right axilla demonstrated normal appearing right axillary lymph nodes  IMPRESSION: Interval mild increase in size of the biopsy-proven malignancy in the 10 o'clock position of the right breast, currently measuring 1.9 cm in maximum diameter.  RECOMMENDATION: Treatment plan.  I have discussed the findings and recommendations with the patient. If applicable, a reminder letter will be sent to the patient regarding the next appointment.  BI-RADS CATEGORY 6: Known biopsy-proven malignancy.   Electronically Signed By: Beckie Salts M.D. On: 12/17/2022 10:04  PROGNOSTIC INDICATORS Results: IMMUNOHISTOCHEMICAL AND MORPHOMETRIC ANALYSIS PERFORMED MANUALLY The tumor cells are positive for Her2 (3+). 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 The E-cadherin stain shows intact staining consistent with a ductal phenotype. The myoepithelial markers are lost within the invasive foci. Dr. Luisa Hart reviewed the case and concurs with the interpretation. A breast prognostic profile (ER, PR, Ki-67 and HER2) is pending and will be reported in an addendum. [facility] was notified on 07/22/2022. Leonarda Salon M.D. Pathologist, Electronic Signature (Case signed 07/23/2022) Specimen Gross and Clinical Information Specimen Comment TIF: 2:38 pm, CIT: < 1 min, mass, not at this site Specimen(s) Assessment and Plan:   Diagnoses and all orders for this visit:  Breast cancer, stage 4, right (CMS/HHS-HCC)   Patient desires right breast lumpectomy due to pain at the tumor site. She is on adjuvant therapy for stage IV metastatic right breast cancer. Risks and benefits of lumpectomy reviewed and the rationale for doing it. She wishes to proceed with right breast seed localized lumpectomy for palliative pain relief.The procedure has been discussed with the patient. Alternatives to  surgery have been discussed with the patient. Risks of surgery include bleeding, Infection, Seroma formation, death, and the need for further surgery. The patient understands  and wishes to proceed.    Hayden Rasmussen, MD   I spent a total of 41 minutes in both face-to-face and non-face-to-face activities, excluding procedures performed, for this visit on the date of this encounter.

## 2023-06-09 NOTE — Discharge Instructions (Addendum)
[facility] Office Phone Number [phone]  BREAST BIOPSY/ PARTIAL MASTECTOMY: POST OP INSTRUCTIONS  Always review your discharge instruction sheet given to you by the facility where your surgery was performed.  IF YOU HAVE DISABILITY OR FAMILY LEAVE FORMS, YOU MUST BRING THEM TO THE OFFICE FOR PROCESSING.  DO NOT GIVE THEM TO YOUR DOCTOR.  A prescription for pain medication may be given to you upon discharge.  Take your pain medication as prescribed, if needed.  If narcotic pain medicine is not needed, then you may take acetaminophen (Tylenol) or ibuprofen (Advil) as needed. Take your usually prescribed medications unless otherwise directed If you need a refill on your pain medication, please contact your pharmacy.  They will contact our office to request authorization.  Prescriptions will not be filled after 5pm or on week-ends. You should eat very light the first 24 hours after surgery, such as soup, crackers, pudding, etc.  Resume your normal diet the day after surgery. Most patients will experience some swelling and bruising in the breast.  Ice packs and a good support bra will help.  Swelling and bruising can take several days to resolve.  It is common to experience some constipation if taking pain medication after surgery.  Increasing fluid intake and taking a stool softener will usually help or prevent this problem from occurring.  A mild laxative (Milk of Magnesia or Miralax) should be taken according to package directions if there are no bowel movements after 48 hours. Unless discharge instructions indicate otherwise, you may remove your bandages 24-48 hours after surgery, and you may shower at that time.  You may have steri-strips (small skin tapes) in place directly over the incision.  These strips should be left on the skin for 7-10 days.  If your surgeon used skin glue on the incision, you may shower in 24 hours.  The glue will flake off over the next 2-3 weeks.  Any  sutures or staples will be removed at the office during your follow-up visit. ACTIVITIES:  You may resume regular daily activities (gradually increasing) beginning the next day.  Wearing a good support bra or sports bra minimizes pain and swelling.  You may have sexual intercourse when it is comfortable. You may drive when you no longer are taking prescription pain medication, you can comfortably wear a seatbelt, and you can safely maneuver your car and apply brakes. RETURN TO WORK:  ______________________________________________________________________________________ Bonita Quin should see your doctor in the office for a follow-up appointment approximately two weeks after your surgery.  Your doctor's nurse will typically make your follow-up appointment when she calls you with your pathology report.  Expect your pathology report 2-3 business days after your surgery.  You may call to check if you do not hear from [place] after three days. OTHER INSTRUCTIONS: _______________________________________________________________________________________________ _____________________________________________________________________________________________________________________________________ _____________________________________________________________________________________________________________________________________ _____________________________________________________________________________________________________________________________________  WHEN TO CALL YOUR DOCTOR: Fever over 101.0 Nausea and/or vomiting. Extreme swelling or bruising. Continued bleeding from incision. Increased pain, redness, or drainage from the incision.  The clinic staff is available to answer your questions during regular business hours.  Please don't hesitate to call and ask to speak to one of the nurses for clinical concerns.  If you have a medical emergency, go to the nearest emergency room or call 911.  A surgeon from [facility] is always on call at the hospital.  For further questions, please visit [email or web address]     Post Anesthesia Home Care Instructions  Activity: Get plenty of rest for the remainder  of the day. A responsible individual must stay with you for 24 hours following the procedure.  For the next 24 hours, DO NOT: -Drive a car -Advertising copywriter -Drink alcoholic beverages -Take any medication unless instructed by your physician -Make any legal decisions or sign important papers.  Meals: Start with liquid foods such as gelatin or soup. Progress to regular foods as tolerated. Avoid greasy, spicy, heavy foods. If nausea and/or vomiting occur, drink only clear liquids until the nausea and/or vomiting subsides. Call your physician if vomiting continues.  Special Instructions/Symptoms: Your throat may feel dry or sore from the anesthesia or the breathing tube placed in your throat during surgery. If this causes discomfort, gargle with warm salt water. The discomfort should disappear within 24 hours.  If you had a scopolamine patch placed behind your ear for the management of post- operative nausea and/or vomiting:  1. The medication in the patch is effective for 72 hours, after which it should be removed.  Wrap patch in a tissue and discard in the trash. Wash hands thoroughly with soap and water. 2. You may remove the patch earlier than 72 hours if you experience unpleasant side effects which may include dry mouth, dizziness or visual disturbances. 3. Avoid touching the patch. Wash your hands with soap and water after contact with the patch.    *May have Tylenol today at 6pm

## 2023-06-09 NOTE — Transfer of Care (Signed)
Immediate Anesthesia Transfer of Care Note  Patient: Natalie [facility]  Procedure(s) Performed: RIGHT BREAST LUMPECTOMY WITH RADIOACTIVE SEED LOCALIZATION (Right)  Patient Location: PACU  Anesthesia Type:General  Level of Consciousness: drowsy  Airway & Oxygen Therapy: Patient Spontanous Breathing and Patient connected to face mask oxygen  Post-op Assessment: Report given to RN and Post -op Vital signs reviewed and stable  Post vital signs: Reviewed and stable  Last Vitals:  Vitals Value Taken Time  BP 94/52 06/09/23 1424  Temp 36.3 C 06/09/23 1424  Pulse 61 06/09/23 1427  Resp 8 06/09/23 1427  SpO2 96 % 06/09/23 1427  Vitals shown include unfiled device data.  Last Pain:  Vitals:   06/09/23 1148  TempSrc: Temporal  PainSc: 3       Patients Stated Pain Goal: 7 (06/09/23 1148)  Complications: No notable events documented.

## 2023-06-09 NOTE — Interval H&P Note (Signed)
History and Physical Interval Note:  06/09/2023 12:30 PM  Natalie [facility]  has presented today for surgery, with the diagnosis of RIGHT BREAST CANCER.  The various methods of treatment have been discussed with the patient and family. After consideration of risks, benefits and other options for treatment, the patient has consented to  Procedure(s): RIGHT BREAST LUMPECTOMY WITH RADIOACTIVE SEED LOCALIZATION (Right) as a surgical intervention.  The patient's history has been reviewed, patient examined, no change in status, stable for surgery.  I have reviewed the patient's chart and labs.  Questions were answered to the patient's satisfaction.     Conny Moening A Emmah Bratcher

## 2023-06-09 NOTE — Anesthesia Postprocedure Evaluation (Signed)
Anesthesia Post Note  Patient: Natalie Griffin  Procedure(s) Performed: RIGHT BREAST LUMPECTOMY WITH RADIOACTIVE SEED LOCALIZATION (Right)     Patient location during evaluation: PACU Anesthesia Type: General Level of consciousness: awake and alert and oriented Pain management: pain level controlled Vital Signs Assessment: post-procedure vital signs reviewed and stable Respiratory status: spontaneous breathing, nonlabored ventilation and respiratory function stable Cardiovascular status: blood pressure returned to baseline and stable Postop Assessment: no apparent nausea or vomiting Anesthetic complications: no   No notable events documented.  Last Vitals:  Vitals:   06/09/23 1445 06/09/23 1500  BP: 98/67 116/74  Pulse: 61 (!) 59  Resp: (!) 8 18  Temp:    SpO2: 99% 100%    Last Pain:  Vitals:   06/09/23 1500  TempSrc:   PainSc: 5                  Ephraim Reichel A.

## 2023-06-09 NOTE — Op Note (Signed)
Preoperative diagnosis: Stage IV right breast cancer  Postoperative diagnosis: Same  Procedure: Right breast seed localized lumpectomy  Surgeon: Harriette Bouillon, MD  Anesthesia: LMA with 0.25% Marcaine with epinephrine  EBL: Minimal  Specimen: Right breast mass with seed and clip verified by Faxitron  Indications for procedure: The patient is a 47 year old female with stage IV right breast cancer.  She is on supportive chemotherapy this point in time has had no significant progression of her distant metastatic disease.  She does have pain though in the right breast at the site of the tumor.  She was counseled about management of this.  Given the fact she has stage IV disease, we had a discussion of the role of surgery and that this would only be palliative.  There are risks of surgery and potential complications of the procedure.  After discussion though she wished to have the right breast mass removed for palliative purposes and pain control since it was becoming quite tender in her breast.  Risks and benefits were reviewed.  Complications reviewed.The procedure has been discussed with the patient. Alternatives to surgery have been discussed with the patient.  Risks of surgery include bleeding,  Infection,  Seroma formation, death,  and the need for further surgery.   The patient understands and wishes to proceed.        Description of procedure: The patient was met in the holding area questions were answered.  The right breast was marked as the correct site.  Of note his seed was placed as an outpatient.  She was taken back to the operating.  She was placed upon upon the OR table.  After induction of general anesthesia, the right breast was prepped and draped in sterile fashion and a timeout performed.  Films were available for review.  The seed was identified the right breast upper outer quadrant.  A curvilinear incision was made over this.  Dissection was carried down and all tissue around  the seed and clip were excised with a grossly negative margin.  There is a small area of contracted skin superior and lateral to this.  I excised this separately.  Irrigation was used.  Local anesthetic was infiltrated throughout.  Hemostasis achieved with cautery.  Deep tissue planes approximated 3-0 Vicryl.  4 Monocryl used to close the skin in a subcuticular fashion.  The more lateral smaller incision with the skin was excised was closed in similar fashion.  Dermabond applied to both.  Breast binder placed.  All counts found to be correct.  The patient was awoke extubated taken to recovery in satisfactory condition.

## 2023-06-09 NOTE — Anesthesia Procedure Notes (Signed)
Procedure Name: LMA Insertion Date/Time: 06/09/2023 1:22 PM  Performed by: Demetrio Lapping, CRNAPre-anesthesia Checklist: Patient identified, Emergency Drugs available, Suction available and Patient being monitored Patient Re-evaluated:Patient Re-evaluated prior to induction Oxygen Delivery Method: Circle System Utilized Preoxygenation: Pre-oxygenation with 100% oxygen Induction Type: IV induction Ventilation: Mask ventilation without difficulty LMA: LMA inserted LMA Size: 4.0 Number of attempts: 1 Airway Equipment and Method: Bite block Placement Confirmation: positive ETCO2 Tube secured with: Tape Dental Injury: Teeth and Oropharynx as per pre-operative assessment

## 2023-06-09 NOTE — Anesthesia Preprocedure Evaluation (Addendum)
Anesthesia Evaluation  Patient identified by MRN, date of birth, ID band Patient awake    Reviewed: Allergy & Precautions, NPO status , Patient's Chart, lab work & pertinent test results  History of Anesthesia Complications (+) PSEUDOCHOLINESTERASE DEFICIENCY, Family history of anesthesia reaction and history of anesthetic complications  Airway Mallampati: II  TM Distance: >3 FB Neck ROM: Full    Dental no notable dental hx. (+) Teeth Intact, Dental Advisory Given   Pulmonary former smoker   Pulmonary exam normal breath sounds clear to auscultation       Cardiovascular negative cardio ROS Normal cardiovascular exam Rhythm:Regular Rate:Normal     Neuro/Psych    GI/Hepatic Neg liver ROS, hiatal hernia,,,  Endo/Other  Right Breast Ca  Renal/GU Hx/o renal calculi  negative genitourinary   Musculoskeletal negative musculoskeletal ROS (+)    Abdominal   Peds  Hematology  (+) Blood dyscrasia, anemia   Anesthesia Other Findings   Reproductive/Obstetrics                             Anesthesia Physical Anesthesia Plan  ASA: 2  Anesthesia Plan: General   Post-op Pain Management: Minimal or no pain anticipated   Induction: Intravenous  PONV Risk Score and Plan: 4 or greater and Treatment may vary due to age or medical condition, Propofol infusion, Midazolam, Ondansetron and Dexamethasone  Airway Management Planned: LMA  Additional Equipment: None  Intra-op Plan:   Post-operative Plan: Extubation in OR  Informed Consent: I have reviewed the patients History and Physical, chart, labs and discussed the procedure including the risks, benefits and alternatives for the proposed anesthesia with the patient or authorized representative who has indicated his/her understanding and acceptance.     Dental advisory given  Plan Discussed with: CRNA and Anesthesiologist  Anesthesia Plan  Comments:        Anesthesia Quick Evaluation

## 2023-06-10 ENCOUNTER — Encounter (HOSPITAL_BASED_OUTPATIENT_CLINIC_OR_DEPARTMENT_OTHER): Payer: Self-pay | Admitting: Surgery

## 2023-06-10 ENCOUNTER — Other Ambulatory Visit: Payer: Self-pay

## 2023-06-10 ENCOUNTER — Encounter: Payer: Self-pay | Admitting: Surgery

## 2023-06-10 LAB — SURGICAL PATHOLOGY

## 2023-06-14 ENCOUNTER — Encounter: Payer: Self-pay | Admitting: Hematology and Oncology

## 2023-06-14 ENCOUNTER — Other Ambulatory Visit: Payer: Self-pay

## 2023-06-14 DIAGNOSIS — C7951 Secondary malignant neoplasm of bone: Secondary | ICD-10-CM

## 2023-06-14 DIAGNOSIS — M25551 Pain in right hip: Secondary | ICD-10-CM

## 2023-06-14 DIAGNOSIS — M25511 Pain in right shoulder: Secondary | ICD-10-CM

## 2023-06-14 DIAGNOSIS — Z170 Estrogen receptor positive status [ER+]: Secondary | ICD-10-CM

## 2023-06-14 DIAGNOSIS — C50212 Malignant neoplasm of upper-inner quadrant of left female breast: Secondary | ICD-10-CM

## 2023-06-15 ENCOUNTER — Telehealth: Payer: Self-pay

## 2023-06-15 ENCOUNTER — Ambulatory Visit (HOSPITAL_COMMUNITY)
Admission: RE | Admit: 2023-06-15 | Discharge: 2023-06-15 | Disposition: A | Discharge status: Home / Self Care | Payer: 59 | Source: Ambulatory Visit | Attending: Hematology and Oncology | Admitting: Hematology and Oncology

## 2023-06-15 DIAGNOSIS — Z170 Estrogen receptor positive status [ER+]: Secondary | ICD-10-CM | POA: Insufficient documentation

## 2023-06-15 DIAGNOSIS — C7951 Secondary malignant neoplasm of bone: Secondary | ICD-10-CM | POA: Insufficient documentation

## 2023-06-15 DIAGNOSIS — C50919 Malignant neoplasm of unspecified site of unspecified female breast: Secondary | ICD-10-CM | POA: Diagnosis not present

## 2023-06-15 DIAGNOSIS — M25551 Pain in right hip: Secondary | ICD-10-CM | POA: Diagnosis not present

## 2023-06-15 DIAGNOSIS — C50212 Malignant neoplasm of upper-inner quadrant of left female breast: Secondary | ICD-10-CM | POA: Insufficient documentation

## 2023-06-15 DIAGNOSIS — M25511 Pain in right shoulder: Secondary | ICD-10-CM | POA: Diagnosis not present

## 2023-06-15 DIAGNOSIS — M160 Bilateral primary osteoarthritis of hip: Secondary | ICD-10-CM | POA: Diagnosis not present

## 2023-06-15 NOTE — Telephone Encounter (Signed)
Attempted to return phone call for voicemail received from Patient. Patient did not answer return call. Left voicemail for Patient to return call.

## 2023-06-16 ENCOUNTER — Other Ambulatory Visit: Payer: Self-pay

## 2023-06-17 ENCOUNTER — Ambulatory Visit: Payer: 59

## 2023-06-17 ENCOUNTER — Inpatient Hospital Stay: Payer: 59 | Attending: Hematology and Oncology | Admitting: Hematology and Oncology

## 2023-06-17 ENCOUNTER — Other Ambulatory Visit (HOSPITAL_COMMUNITY): Payer: Self-pay

## 2023-06-17 ENCOUNTER — Inpatient Hospital Stay: Payer: 59

## 2023-06-17 VITALS — BP 134/71 | HR 85 | Temp 97.7°F | Resp 18 | Ht 65.0 in | Wt 150.2 lb

## 2023-06-17 DIAGNOSIS — Z5112 Encounter for antineoplastic immunotherapy: Secondary | ICD-10-CM | POA: Diagnosis not present

## 2023-06-17 DIAGNOSIS — C50411 Malignant neoplasm of upper-outer quadrant of right female breast: Secondary | ICD-10-CM | POA: Diagnosis not present

## 2023-06-17 DIAGNOSIS — C50212 Malignant neoplasm of upper-inner quadrant of left female breast: Secondary | ICD-10-CM | POA: Insufficient documentation

## 2023-06-17 DIAGNOSIS — C7951 Secondary malignant neoplasm of bone: Secondary | ICD-10-CM | POA: Diagnosis not present

## 2023-06-17 DIAGNOSIS — Z923 Personal history of irradiation: Secondary | ICD-10-CM | POA: Diagnosis not present

## 2023-06-17 DIAGNOSIS — Z170 Estrogen receptor positive status [ER+]: Secondary | ICD-10-CM

## 2023-06-17 DIAGNOSIS — R748 Abnormal levels of other serum enzymes: Secondary | ICD-10-CM | POA: Diagnosis not present

## 2023-06-17 MED ORDER — OXYCODONE HCL 5 MG PO TABS
5.0000 mg | ORAL_TABLET | ORAL | 0 refills | Status: DC | PRN
Start: 2023-06-17 — End: 2023-08-11
  Filled 2023-06-17: qty 120, 20d supply, fill #0

## 2023-06-17 MED ORDER — TRASTUZUMAB-HYALURONIDASE-OYSK 600-10000 MG-UNT/5ML ~~LOC~~ SOLN
600.0000 mg | Freq: Once | SUBCUTANEOUS | Status: AC
  Administered 2023-06-17: 600 mg via SUBCUTANEOUS
  Filled 2023-06-17: qty 5

## 2023-06-17 MED ORDER — ALPRAZOLAM 0.5 MG PO TABS
0.5000 mg | ORAL_TABLET | Freq: Three times a day (TID) | ORAL | 3 refills | Status: DC | PRN
Start: 2023-06-17 — End: 2024-02-13
  Filled 2023-06-17: qty 90, 30d supply, fill #0
  Filled 2023-10-07: qty 90, 30d supply, fill #1

## 2023-06-17 NOTE — Progress Notes (Signed)
[facility] CSW Progress Note  Patient was referred by RN K. Destoa to discuss insurance. CSW met with patient briefly between appointments and provided card direct information. CSW will send referral for assistance with insurance and identify any potential resources that may be available to assist her.    Marguerita Merles, LCSWA Clinical Social Worker [facility]

## 2023-06-17 NOTE — Assessment & Plan Note (Addendum)
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  Patient refused adjuvant chemo, adjuvant radiation and adjuvant antiestrogen therapy.   Patient went to orthopedics for right hip pain.  Imaging revealed bone metastasis.  CT CAP: Cortical destruction of the right femoral neck consistent with skeletal metastases at risk for pathologic fracture.  Lucent lesion in the left iliac bone and L3 vertebral body consistent with multifocal skeletal metastases. 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%, HER2 3+ positive, Ki-67 35%    Treatment plan change: Oophorectomy: 09/30/2022 Current treatment: Anastrozole, Herceptin Hylecta Patient refused switching from Herceptin to Kadcyla (her insurance denied Herceptin Hylecta and patient does not want to receive it in IV form), stopped Herceptin (last given 09/17/2022) Neratinib started 01/05/2023 discontinued 02/08/2023 resumed Herceptin Hylecta 04/20/2023   Pain control: MS Contin and Dilaudid   Hospitalization 03/24/2023-03/27/2023 increased pain in the back: Vertebral mets and compression fractures: Radiation to lumbar spine completed 04/12/2023   Elevated alkaline  phosphatase: Secondary to bone metastases and hypercalcemia.  She does not want to take Xgeva.    Current treatment: Herceptin Hylecta every 4 weeks CT CAP 05/27/2023: Increase sclerosis of bone metastases.  06/09/2023: Right lumpectomy: Grade 2 IDC 1.9 cm with high-grade DCIS, margins negative, LVI present, ER 90%, PR 60%, HER2 3+ positive, Ki67 35%, RCB class II  Return to clinic every 3 weeks for Herceptin injections and every 6 weeks of follow-up with me. Patient lost her job at [employer] and is concerned about her insurance.  We will request our case manager/social work to help her with Medicaid application.

## 2023-06-17 NOTE — Progress Notes (Signed)
Patient Care Team: Serena Croissant, MD as PCP - General (Hematology and Oncology) Donnelly Angelica, RN as Oncology Nurse Navigator Pershing Proud, RN as Oncology Nurse Navigator Rachel Moulds, MD as Consulting Physician (Hematology and Oncology)  DIAGNOSIS:  Encounter Diagnosis  Name Primary?   Malignant neoplasm of upper-inner quadrant of left breast in female, estrogen receptor positive (HCC) Yes    SUMMARY OF ONCOLOGIC HISTORY: Oncology History  Malignant neoplasm of upper-inner quadrant of left breast in female, estrogen receptor positive (HCC)  04/11/2020 Initial Diagnosis   Patient palpated a left breast lump. Mammogram on 03/08/20 showed a 3.0cm mass at the 11 o'clock position with no axillary adenopathy. 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    02/2021 Relapse/Recurrence   Patient went to orthopedics for right hip pain.  Imaging revealed bone metastasis.  CT CAP: Cortical destruction of the right femoral neck consistent with skeletal metastases at risk for pathologic fracture.  Lucent lesion in the left iliac bone and L3 vertebral body consistent with multifocal skeletal metastases.   04/01/2021 Surgery   04/01/2021: Femoral intramedullary nail  Pathology review: Metastatic breast cancer ER 2% PR 0% HER2 3+ positive   04/08/2021 -  Anti-estrogen oral therapy   Tamoxifen 20 mg was prescribed on 04/08/2021 (discontinued by patient because she felt that the risks and benefits did not support it )--resumed in 07/2022 based on pathology results from right  breast biopsy   05/14/2021 -  Chemotherapy   Subcutaneous Herceptin Perjeta (Phesgo) injection started 05/14/2021, switched to Herceptin Hylecta 06/11/2021 (Fixed drug eruption, profound diarrhea to Phesgo)     05/20/2021 - 06/03/2021 Radiation Therapy   Palliative radiation to the iliac bone and hip   02/04/2022 Imaging   MRI thoracic spine 02/04/2022: Numerous bone metastases thoracic spine cervical and lumbar spines largest T5, T8, T11.  Chronic compression fractures T4, T5, T10 and T12    05/07/2022 Imaging   CT chest abdomen pelvis: No new or progressive bone metastases.  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   07/29/2022 PET scan   IMPRESSION: 1. Hypermetabolic mass in the posterior deep RIGHT breast consistent primary breast carcinoma. 2. Central nodal mediastinal metastasis to the high LEFT prevascular space and LEFT super clavicular node. 3. 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  [name] recommended changing treatment to Kadcyla and Monae wanted to wait until after the holidays.  She will discuss further with Dr. Pamelia Hoit in January.   11/19/2022 - 11/19/2022 Chemotherapy   Patient is on Treatment Plan : BREAST Trastuzumab IV (8/6) or SQ (600) D1 q21d     04/20/2023 -  Chemotherapy   Patient is on Treatment Plan : BREAST MAINTENANCE Trastuzumab IV (6) or SQ (600) D1 q21d x 13 cycles       CHIEF COMPLIANT: Follow-up after recent breast  surgery  Discussed the use of AI scribe software for clinical note transcription with the patient, who gave verbal consent to proceed.  History of Present Illness   The patient, with a history of ductal breast cancer, presents for a post-lymphadenectomy follow-up. The  final size of the removed tumor was 1.9 cm, with negative margins for both the invasive cancer and the precancerous DCIS. The patient also had atypical lobular hyperplasia removed. The patient's cancer was estrogen positive, progesterone positive, and HER2 positive, with a proliferation rate of 35%. The patient reports that the surgical site is still tender and sore, but is hopeful to regain some normalcy, such as wearing a bra in public.  The patient also presents with chronic pain, particularly in the back, right shoulder, and right hip. The pain in the back is exacerbated by rainy weather. The right shoulder pain began after a reaching motion caused a "crunchy, crackly" sound, followed by heat, throbbing, and swelling. The patient has been using ice packs to manage the swelling and has a decreased range of motion in the right arm. The right hip pain has been worsening over the past two weeks, causing a limp and difficulty sitting down due to pain in the ischial spine. The patient has had x-rays done for both the shoulder and hip, but results are pending.  The patient also discusses financial and insurance difficulties, as well as the emotional toll of accepting her chronic pain and inability to return to work.         ALLERGIES:  is allergic to adhesive [tape], fentanyl, phesgo [pertuz-trastuz-hyaluron-zzxf], tetanus toxoid, and succinylcholine.  MEDICATIONS:  Current Outpatient Medications  Medication Sig Dispense Refill   anastrozole (ARIMIDEX) 1 MG tablet Take 1 tablet (1 mg total) by mouth daily. 90 tablet 3   morphine (MS CONTIN) 60 MG 12 hr tablet Take 1 tablet (60 mg total) by mouth every 12 (twelve) hours. 120 tablet 0   ondansetron (ZOFRAN) 4 MG tablet Take 1 tablet (4 mg total) by mouth every 6 (six) hours as needed for nausea. 30 tablet 0   ALPRAZolam (XANAX) 0.5 MG tablet Take 1 tablet (0.5 mg total) by mouth 3 (three) times daily as needed for anxiety. 90 tablet 3   oxyCODONE (OXY  IR/ROXICODONE) 5 MG immediate release tablet Take 1 tablet (5 mg total) by mouth every 4 (four) hours as needed for severe pain. 120 tablet 0   No current facility-administered medications for this visit.    PHYSICAL EXAMINATION: ECOG PERFORMANCE STATUS: 1 - Symptomatic but completely ambulatory  Vitals:   06/17/23 1421  BP: 134/71  Pulse: 85  Resp: 18  Temp: 97.7 F (36.5 C)  SpO2: 98%   Filed Weights   06/17/23 1421  Weight: 150 lb 3.2 oz (68.1 kg)      LABORATORY DATA:  I have reviewed the data as listed    Latest Ref Rng & Units 04/20/2023    2:45 PM 03/25/2023   12:44 AM 03/24/2023    1:49 PM  CMP  Glucose 70 - 99 mg/dL 818  299  371   BUN 6 - 20 mg/dL 11  12  10    Creatinine 0.44 - 1.00 mg/dL 6.96  7.89  3.81   Sodium 135 - 145 mmol/L 139  135  137   Potassium 3.5 - 5.1 mmol/L 4.0  3.9  3.9   Chloride 98 - 111 mmol/L 105  101  100   CO2 22 - 32 mmol/L 24  22  23   Calcium 8.9 - 10.3 mg/dL 95.2  9.9  84.1   Total Protein 6.5 - 8.1 g/dL 7.7  7.4  8.5   Total Bilirubin 0.3 - 1.2 mg/dL 0.3  0.4  0.6   Alkaline Phos 38 - 126 U/L 381  348  398   AST 15 - 41 U/L 26  58  65   ALT 0 - 44 U/L 14  34  36     Lab Results  Component Value Date   WBC 5.5 04/20/2023   HGB 11.7 (L) 04/20/2023   HCT 35.9 (L) 04/20/2023   MCV 86.5 04/20/2023   PLT 300 04/20/2023   NEUTROABS 3.7 04/20/2023    ASSESSMENT & PLAN:  Malignant neoplasm of upper-inner quadrant of left breast in female, estrogen receptor positive (HCC) 06/04/2020:Left lumpectomy ([name]): invasive and in situ ductal carcinoma, 3.2cm, clear margins, one left axillary lymph node negative for carcinoma.  Patient refused adjuvant chemo, adjuvant radiation and adjuvant antiestrogen therapy.   Patient went to orthopedics for right hip pain.  Imaging revealed bone metastasis.  CT CAP: Cortical destruction of the right femoral neck consistent with skeletal metastases at risk for pathologic fracture.  Lucent lesion in the  left iliac bone and L3 vertebral body consistent with multifocal skeletal metastases. 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%, HER2 3+ positive, Ki-67 35%    Treatment plan change: Oophorectomy: 09/30/2022 Current treatment: Anastrozole, Herceptin Hylecta Patient refused switching from Herceptin to Kadcyla (her insurance denied Herceptin Hylecta and patient does not want to receive it in IV form), stopped Herceptin (last given 09/17/2022) Neratinib started 01/05/2023 discontinued 02/08/2023 resumed Herceptin Hylecta 04/20/2023   Pain control: MS Contin and Dilaudid   Hospitalization 03/24/2023-03/27/2023 increased pain in the back: Vertebral mets and compression fractures: Radiation to lumbar spine completed 04/12/2023   Elevated alkaline phosphatase: Secondary to bone metastases and hypercalcemia.  She does not want to take Xgeva.    Current treatment: Herceptin Hylecta every 4 weeks CT CAP 05/27/2023: Increase sclerosis of bone metastases.  06/09/2023: Right lumpectomy: Grade 2 IDC 1.9 cm with high-grade DCIS, margins negative, LVI present, ER 90%, PR 60%, HER2 3+ positive, Ki67 35%, RCB class II  Return to clinic every 3 weeks for Herceptin injections and every 6 weeks of follow-up with me. Patient lost  her job at [employer] and is concerned about her insurance.  We will request our case manager/social work to help her with Medicaid application. ------------------------------------- Assessment and Plan    Breast Cancer Post-lymphectomy with negative margins for invasive cancer and DCIS. Estrogen positive, progesterone positive, HER2 positive. Proliferation rate of 35%. -No additional treatment discussed.  Right Scapula Pain Recent onset following physical exertion. No fracture identified on X-ray. -Continue cold treatment and pain medication.  Right Hip Pain Recent worsening with difficulty sitting and limping. -Await radiology report for further assessment.  Chronic Pain Management Patient reports significant  pain and has requested to resume Percocet and Xanax. -Resume Percocet and Xanax. Double the number of pills to extend duration of supply.  Music therapist Concerns Patient's insurance coverage ending at the end of the month. -Refer to social work for assistance with Medicaid application.          No orders of the defined types were placed in this encounter.  The patient has a good understanding of the overall plan. she agrees with it. she will call with any problems that may develop before the next visit here. Total time spent: 30 mins including face to face time and time spent for planning, charting and co-ordination of care   Tamsen Meek, MD 06/17/23

## 2023-06-21 ENCOUNTER — Telehealth: Payer: Self-pay

## 2023-06-21 NOTE — Telephone Encounter (Signed)
[facility] CSW Progress Note  Clinical Social Worker  attempted to reach patient  to folllow up on initial meeting and to assess for needs. Patient did not answer. CSW left vm with direct contact and referral information to assist with applying for medicaid.     Marguerita Merles, LCSWA Clinical Social Worker [facility]

## 2023-06-22 ENCOUNTER — Telehealth: Payer: Self-pay

## 2023-06-22 NOTE — Telephone Encounter (Signed)
Patient informed that [employer] disability documents had been completed and faxed to company. Fax conformation received. Patient sent ROI via email to sign for records request to be completed once it is sent back.

## 2023-06-23 ENCOUNTER — Ambulatory Visit: Payer: 59

## 2023-06-23 ENCOUNTER — Ambulatory Visit: Payer: 59 | Admitting: Hematology and Oncology

## 2023-06-25 ENCOUNTER — Encounter: Payer: Self-pay | Admitting: Hematology and Oncology

## 2023-06-25 ENCOUNTER — Inpatient Hospital Stay: Payer: 59 | Attending: Hematology and Oncology

## 2023-06-25 DIAGNOSIS — C50212 Malignant neoplasm of upper-inner quadrant of left female breast: Secondary | ICD-10-CM | POA: Insufficient documentation

## 2023-06-25 DIAGNOSIS — Z5112 Encounter for antineoplastic immunotherapy: Secondary | ICD-10-CM | POA: Insufficient documentation

## 2023-06-25 NOTE — Progress Notes (Signed)
[facility] CSW Progress Note  Visual merchandiser  spoke with patient  to discuss additional insurance options, due to not meeting eligibility requirements for [employer]. CSW provided information for [place] [employer] to assist with applications for insurance. Patient reported to CSW concerns with possible infection in her breast and not being able to pay for medical bill at ED. CSW encouraged patient to visit ED and explained ED will admit patient with ability to pay, patient plans to go to ED. CSW informed patient's medical team CSW consulted with Financial Resource Specialist to see if additional resources will be available to help with infusion medications, CSW will follow up  Marguerita Merles, LCSW Clinical Social Worker [facility]

## 2023-06-26 ENCOUNTER — Other Ambulatory Visit: Payer: Self-pay

## 2023-06-26 ENCOUNTER — Emergency Department (HOSPITAL_COMMUNITY): Payer: Self-pay

## 2023-06-26 ENCOUNTER — Inpatient Hospital Stay (HOSPITAL_COMMUNITY)
Admission: EM | Admit: 2023-06-26 | Discharge: 2023-06-28 | DRG: 392 | Discharge status: Against Medical Advice | Payer: Self-pay | Attending: Internal Medicine | Admitting: Internal Medicine

## 2023-06-26 ENCOUNTER — Encounter (HOSPITAL_COMMUNITY): Payer: Self-pay

## 2023-06-26 DIAGNOSIS — Z66 Do not resuscitate: Secondary | ICD-10-CM | POA: Diagnosis present

## 2023-06-26 DIAGNOSIS — C50911 Malignant neoplasm of unspecified site of right female breast: Secondary | ICD-10-CM | POA: Diagnosis present

## 2023-06-26 DIAGNOSIS — G8918 Other acute postprocedural pain: Secondary | ICD-10-CM

## 2023-06-26 DIAGNOSIS — G893 Neoplasm related pain (acute) (chronic): Secondary | ICD-10-CM | POA: Diagnosis present

## 2023-06-26 DIAGNOSIS — Z79899 Other long term (current) drug therapy: Secondary | ICD-10-CM

## 2023-06-26 DIAGNOSIS — M8458XA Pathological fracture in neoplastic disease, other specified site, initial encounter for fracture: Secondary | ICD-10-CM | POA: Diagnosis present

## 2023-06-26 DIAGNOSIS — Z91048 Other nonmedicinal substance allergy status: Secondary | ICD-10-CM

## 2023-06-26 DIAGNOSIS — Z887 Allergy status to serum and vaccine status: Secondary | ICD-10-CM

## 2023-06-26 DIAGNOSIS — R197 Diarrhea, unspecified: Secondary | ICD-10-CM | POA: Diagnosis present

## 2023-06-26 DIAGNOSIS — T8149XA Infection following a procedure, other surgical site, initial encounter: Secondary | ICD-10-CM | POA: Diagnosis present

## 2023-06-26 DIAGNOSIS — K529 Noninfective gastroenteritis and colitis, unspecified: Principal | ICD-10-CM | POA: Diagnosis present

## 2023-06-26 DIAGNOSIS — Z79891 Long term (current) use of opiate analgesic: Secondary | ICD-10-CM

## 2023-06-26 DIAGNOSIS — Z885 Allergy status to narcotic agent status: Secondary | ICD-10-CM

## 2023-06-26 DIAGNOSIS — C7951 Secondary malignant neoplasm of bone: Secondary | ICD-10-CM | POA: Diagnosis present

## 2023-06-26 DIAGNOSIS — Z87442 Personal history of urinary calculi: Secondary | ICD-10-CM

## 2023-06-26 DIAGNOSIS — Z79811 Long term (current) use of aromatase inhibitors: Secondary | ICD-10-CM

## 2023-06-26 DIAGNOSIS — Z1152 Encounter for screening for COVID-19: Secondary | ICD-10-CM

## 2023-06-26 DIAGNOSIS — E860 Dehydration: Secondary | ICD-10-CM | POA: Diagnosis present

## 2023-06-26 DIAGNOSIS — N61 Mastitis without abscess: Principal | ICD-10-CM

## 2023-06-26 DIAGNOSIS — R0600 Dyspnea, unspecified: Secondary | ICD-10-CM | POA: Diagnosis present

## 2023-06-26 DIAGNOSIS — M96843 Postprocedural seroma of a musculoskeletal structure following other procedure: Secondary | ICD-10-CM | POA: Diagnosis present

## 2023-06-26 DIAGNOSIS — Z170 Estrogen receptor positive status [ER+]: Secondary | ICD-10-CM

## 2023-06-26 DIAGNOSIS — E872 Acidosis, unspecified: Secondary | ICD-10-CM | POA: Diagnosis present

## 2023-06-26 DIAGNOSIS — Z87891 Personal history of nicotine dependence: Secondary | ICD-10-CM

## 2023-06-26 DIAGNOSIS — Z888 Allergy status to other drugs, medicaments and biological substances status: Secondary | ICD-10-CM

## 2023-06-26 DIAGNOSIS — C50212 Malignant neoplasm of upper-inner quadrant of left female breast: Secondary | ICD-10-CM

## 2023-06-26 DIAGNOSIS — E876 Hypokalemia: Secondary | ICD-10-CM | POA: Diagnosis not present

## 2023-06-26 LAB — RESP PANEL BY RT-PCR (RSV, FLU A&B, COVID)  RVPGX2
Influenza A by PCR: NEGATIVE
Influenza B by PCR: NEGATIVE
Resp Syncytial Virus by PCR: NEGATIVE
SARS Coronavirus 2 by RT PCR: NEGATIVE

## 2023-06-26 LAB — CBC WITH DIFFERENTIAL/PLATELET
Abs Immature Granulocytes: 0.03 10*3/uL (ref 0.00–0.07)
Basophils Absolute: 0 10*3/uL (ref 0.0–0.1)
Basophils Relative: 0 %
Eosinophils Absolute: 0.1 10*3/uL (ref 0.0–0.5)
Eosinophils Relative: 1 %
HCT: 40 % (ref 36.0–46.0)
Hemoglobin: 13 g/dL (ref 12.0–15.0)
Immature Granulocytes: 0 %
Lymphocytes Relative: 5 %
Lymphs Abs: 0.5 10*3/uL — ABNORMAL LOW (ref 0.7–4.0)
MCH: 28 pg (ref 26.0–34.0)
MCHC: 32.5 g/dL (ref 30.0–36.0)
MCV: 86.2 fL (ref 80.0–100.0)
Monocytes Absolute: 0.4 10*3/uL (ref 0.1–1.0)
Monocytes Relative: 4 %
Neutro Abs: 8.6 10*3/uL — ABNORMAL HIGH (ref 1.7–7.7)
Neutrophils Relative %: 90 %
Platelets: 411 10*3/uL — ABNORMAL HIGH (ref 150–400)
RBC: 4.64 MIL/uL (ref 3.87–5.11)
RDW: 14.7 % (ref 11.5–15.5)
WBC: 9.6 10*3/uL (ref 4.0–10.5)
nRBC: 0 % (ref 0.0–0.2)

## 2023-06-26 LAB — HCG, SERUM, QUALITATIVE: Preg, Serum: NEGATIVE

## 2023-06-26 LAB — COMPREHENSIVE METABOLIC PANEL
ALT: 14 U/L (ref 0–44)
AST: 22 U/L (ref 15–41)
Albumin: 4.1 g/dL (ref 3.5–5.0)
Alkaline Phosphatase: 302 U/L — ABNORMAL HIGH (ref 38–126)
Anion gap: 17 — ABNORMAL HIGH (ref 5–15)
BUN: 15 mg/dL (ref 6–20)
CO2: 20 mmol/L — ABNORMAL LOW (ref 22–32)
Calcium: 10.9 mg/dL — ABNORMAL HIGH (ref 8.9–10.3)
Chloride: 100 mmol/L (ref 98–111)
Creatinine, Ser: 0.85 mg/dL (ref 0.44–1.00)
GFR, Estimated: >60 mL/min (ref >60)
Glucose, Bld: 135 mg/dL — ABNORMAL HIGH (ref 70–99)
Potassium: 3.7 mmol/L (ref 3.5–5.1)
Sodium: 137 mmol/L (ref 135–145)
Total Bilirubin: 0.5 mg/dL (ref 0.3–1.2)
Total Protein: 9 g/dL — ABNORMAL HIGH (ref 6.5–8.1)

## 2023-06-26 LAB — TROPONIN I (HIGH SENSITIVITY)
Troponin I (High Sensitivity): 5 ng/L (ref <18)
Troponin I (High Sensitivity): 4 ng/L (ref <18)

## 2023-06-26 LAB — I-STAT CG4 LACTIC ACID, ED
Lactic Acid, Venous: 2.1 mmol/L (ref 0.5–1.9)
Lactic Acid, Venous: 1.3 mmol/L (ref 0.5–1.9)

## 2023-06-26 LAB — BRAIN NATRIURETIC PEPTIDE: B Natriuretic Peptide: 20.4 pg/mL (ref 0.0–100.0)

## 2023-06-26 MED ORDER — OXYCODONE HCL 5 MG PO TABS: 5.0000 mg | ORAL_TABLET | ORAL | Status: DC | PRN

## 2023-06-26 MED ORDER — LACTATED RINGERS IV BOLUS
1000.0000 mL | Freq: Once | INTRAVENOUS | Status: AC
  Administered 2023-06-26: 1000 mL via INTRAVENOUS

## 2023-06-26 MED ORDER — MORPHINE SULFATE ER 30 MG PO TBCR
60.0000 mg | EXTENDED_RELEASE_TABLET | Freq: Two times a day (BID) | ORAL | Status: DC
  Administered 2023-06-27 (×3): 60 mg via ORAL
  Filled 2023-06-26 (×4): qty 2

## 2023-06-26 MED ORDER — IOHEXOL 350 MG/ML SOLN
75.0000 mL | Freq: Once | INTRAVENOUS | Status: AC | PRN
  Administered 2023-06-26: 75 mL via INTRAVENOUS

## 2023-06-26 MED ORDER — VANCOMYCIN HCL 1500 MG/300ML IV SOLN
1500.0000 mg | Freq: Once | INTRAVENOUS | Status: AC
  Administered 2023-06-27: 1500 mg via INTRAVENOUS
  Filled 2023-06-26: qty 300

## 2023-06-26 MED ORDER — ANASTROZOLE 1 MG PO TABS
1.0000 mg | ORAL_TABLET | Freq: Every day | ORAL | Status: DC
  Filled 2023-06-26 (×2): qty 1

## 2023-06-26 MED ORDER — DOXYCYCLINE HYCLATE 100 MG PO TABS: 100.0000 mg | ORAL_TABLET | Freq: Once | ORAL | Status: DC

## 2023-06-26 MED ORDER — ENOXAPARIN SODIUM 40 MG/0.4ML IJ SOSY
40.0000 mg | PREFILLED_SYRINGE | Freq: Every day | INTRAMUSCULAR | Status: DC
  Filled 2023-06-26 (×2): qty 0.4

## 2023-06-26 MED ORDER — LACTATED RINGERS IV SOLN: INTRAVENOUS | Status: DC

## 2023-06-26 MED ORDER — SODIUM CHLORIDE 0.9 % IV SOLN
1.0000 g | Freq: Once | INTRAVENOUS | Status: DC
  Filled 2023-06-26: qty 10

## 2023-06-26 MED ORDER — ALPRAZOLAM 0.5 MG PO TABS
0.5000 mg | ORAL_TABLET | Freq: Three times a day (TID) | ORAL | Status: DC | PRN
  Administered 2023-06-27: 0.5 mg via ORAL
  Filled 2023-06-26: qty 1

## 2023-06-26 MED ORDER — ADULT MULTIVITAMIN W/MINERALS CH
1.0000 | ORAL_TABLET | Freq: Every day | ORAL | Status: DC
  Administered 2023-06-27: 1 via ORAL
  Filled 2023-06-26 (×2): qty 1

## 2023-06-26 NOTE — ED Notes (Addendum)
Assumed care of patient, NAD noted at this time, pt resting in bed, respirations even and unlabored, skin warm and dry, bed in low position, call light within reach. Pt sleeping at this time. Will continue to monitor.

## 2023-06-26 NOTE — Progress Notes (Signed)
ED Pharmacy Antibiotic Sign Off An antibiotic consult was received from an ED provider for vancomycin per pharmacy dosing for cellulitis. A chart review was completed to assess appropriateness.   The following one time order(s) were placed:  Vancomycin 1500mg  x1  Ceftriaxone per EDP   Further antibiotic and/or antibiotic pharmacy consults should be ordered by the admitting provider if indicated.   Thank you for allowing pharmacy to be a part of this patient's care.   Estill Batten, PharmD, BCCCP  Clinical Pharmacist 06/26/23 9:44 PM

## 2023-06-26 NOTE — Progress Notes (Signed)
New Admission Note:  Arrival Method: By stretcher from ED around this time Mental Orientation: Alert and oriented x 4 Telemetry: None Assessment: Completed Skin: Completed, refer to flowsheets IV: Right upper arm. Getting new IV Pain: None Tubes: None Safety Measures: Safety Fall Prevention Plan was given, discussed and signed. Admission: Completed [facility] Orientation: Patient has been orientated to the room, unit and the staff. Family: None  Orders have been reviewed and implemented. Will continue to monitor the patient. Call light has been placed within reach and bed alarm has been activated.   Natalie Macho, RN  Phone Number: [phone]

## 2023-06-26 NOTE — ED Provider Notes (Signed)
[place] EMERGENCY DEPARTMENT AT [facility] Provider Note   CSN: [id number] Arrival date & time: 06/26/23  1653     History  Chief Complaint  Patient presents with   Shortness of Breath   Near Syncope   Post-op Problem    Natalie Griffin is a 47 y.o. female.   Shortness of Breath Near Syncope Associated symptoms include shortness of breath.     47 year old female with medical history significant for metastatic breast cancer status post lumpectomy with radioactive seed and sentinel lymph node biopsy, currently on maintenance chemotherapy trastuzumab who presents to the emergency department with shortness of breath.  The patient states that at her right breast lumpectomy site she has noticed some mild redness and slight purulent drainage.  She denies any fevers.  She additionally states that she woke up today and felt very short of breath.  She has not had anything to eat or drink in the last 2 days and has had been having episodes of loose watery stools.  She felt diaphoretic and clammy, endorses mild right-sided chest discomfort.  Home Medications Prior to Admission medications   Medication Sig Start Date End Date Taking? Authorizing Provider  ALPRAZolam Prudy Feeler) 0.5 MG tablet Take 1 tablet (0.5 mg total) by mouth 3 (three) times daily as needed for anxiety. 06/17/23  Yes Serena Croissant, MD  anastrozole (ARIMIDEX) 1 MG tablet Take 1 tablet (1 mg total) by mouth daily. 09/24/22  Yes Serena Croissant, MD  morphine (MS CONTIN) 60 MG 12 hr tablet Take 1 tablet (60 mg total) by mouth every 12 (twelve) hours. 06/08/23  Yes Serena Croissant, MD  Multiple Vitamin (ONE DAILY MULTIVITAMIN ADULT PO) Take 1 tablet by mouth daily.   Yes [provider]  oxyCODONE (OXY IR/ROXICODONE) 5 MG immediate release tablet Take 1 tablet (5 mg total) by mouth every 4 (four) hours as needed for severe pain. 06/17/23   Serena Croissant, MD      Allergies    Adhesive [tape], Fentanyl, Phesgo  [pertuz-trastuz-[id number]-zzxf], Tetanus toxoid, and Succinylcholine    Review of Systems   Review of Systems  Respiratory:  Positive for shortness of breath.   Cardiovascular:  Positive for near-syncope.  Skin:  Positive for color change and wound.  All other systems reviewed and are negative.   Physical Exam Updated Vital Signs BP 128/72 (BP Location: Left Leg)   Pulse 99   Temp 98.3 F (36.8 C)   Resp 19   Ht 5\' 5"  (1.651 m)   Wt 68 kg   SpO2 97%   BMI 24.96 kg/m  Physical Exam Vitals and nursing note reviewed. Exam conducted with a chaperone present.  Constitutional:      General: She is not in acute distress.    Appearance: She is well-developed.  HENT:     Head: Normocephalic and atraumatic.     Mouth/Throat:     Mouth: Mucous membranes are dry.  Eyes:     Conjunctiva/sclera: Conjunctivae normal.  Cardiovascular:     Rate and Rhythm: Regular rhythm. Tachycardia present.     Heart sounds: No murmur heard. Pulmonary:     Effort: Pulmonary effort is normal. Tachypnea present. No respiratory distress.     Breath sounds: Normal breath sounds.  Chest:     Comments: Right breast status postlumpectomy with  incision appears to be well-healing, area of erythema and minimal drainage noted superior to the incision site with mild surrounding tenderness Abdominal:     Palpations: Abdomen is  soft.     Tenderness: There is no abdominal tenderness.  Musculoskeletal:        General: No swelling.     Cervical back: Neck supple.  Skin:    General: Skin is warm and dry.     Capillary Refill: Capillary refill takes less than 2 seconds.  Neurological:     Mental Status: She is alert.  Psychiatric:        Mood and Affect: Mood normal.     ED Results / Procedures / Treatments   Labs (all labs ordered are listed, but only abnormal results are displayed) Labs Reviewed  COMPREHENSIVE METABOLIC PANEL - Abnormal; Notable for the following components:      Result Value   CO2 20  (*)    Glucose, Bld 135 (*)    Calcium 10.9 (*)    Total Protein 9.0 (*)    Alkaline Phosphatase 302 (*)    Anion gap 17 (*)    All other components within normal limits  CBC WITH DIFFERENTIAL/PLATELET - Abnormal; Notable for the following components:   Platelets 411 (*)    Neutro Abs 8.6 (*)    Lymphs Abs 0.5 (*)    All other components within normal limits  COMPREHENSIVE METABOLIC PANEL - Abnormal; Notable for the following components:   Potassium 3.4 (*)    CO2 19 (*)    Glucose, Bld 119 (*)    Albumin 3.3 (*)    Alkaline Phosphatase 241 (*)    All other components within normal limits  CBC - Abnormal; Notable for the following components:   RBC 3.70 (*)    Hemoglobin 10.1 (*)    HCT 31.6 (*)    All other components within normal limits  I-STAT CG4 LACTIC ACID, ED - Abnormal; Notable for the following components:   Lactic Acid, Venous 2.1 (*)    All other components within normal limits  CULTURE, BLOOD (ROUTINE X 2)  CULTURE, BLOOD (ROUTINE X 2)  RESP PANEL BY RT-PCR (RSV, FLU A&B, COVID)  RVPGX2  GASTROINTESTINAL PANEL BY PCR, STOOL (REPLACES STOOL CULTURE)  C DIFFICILE QUICK SCREEN W PCR REFLEX    HCG, SERUM, QUALITATIVE  BRAIN NATRIURETIC PEPTIDE  MAGNESIUM  I-STAT CG4 LACTIC ACID, ED  TROPONIN I (HIGH SENSITIVITY)  TROPONIN I (HIGH SENSITIVITY)    EKG EKG Interpretation Date/Time:  Saturday June 26 2023 17:27:04 EDT Ventricular Rate:  96 PR Interval:  135 QRS Duration:  76 QT Interval:  332 QTC Calculation: 420 R Axis:   60  Text Interpretation: Sinus rhythm Confirmed by Ernie Avena ([id number]) on 06/26/2023 6:16:07 PM Also confirmed by Ernie Avena ([id number]), editor Jillene Bucks [phone])  on 06/27/2023 9:46:23 AM  Radiology CT Angio Chest PE W and/or Wo Contrast  Result Date: 06/26/2023 CLINICAL DATA:  Status post lumpectomy on September 18. Woke up today and felt very short of breath, clammy. Diarrhea. Drainage to incision site on the right. EXAM: CT  ANGIOGRAPHY CHEST WITH CONTRAST TECHNIQUE: Multidetector CT imaging of the chest was performed using the standard protocol during bolus administration of intravenous contrast. Multiplanar CT image reconstructions and MIPs were obtained to evaluate the vascular anatomy. RADIATION DOSE REDUCTION: This exam was performed according to the departmental dose-optimization program which includes automated exposure control, adjustment of the mA and/or kV according to patient size and/or use of iterative reconstruction technique. CONTRAST:  75mL OMNIPAQUE IOHEXOL 350 MG/ML SOLN COMPARISON:  Chest radiograph earlier today and CT 05/21/2023 FINDINGS: Cardiovascular: Negative for acute pulmonary embolism.  Normal caliber thoracic aorta. No pericardial effusion. Mediastinum/Nodes: Trachea and esophagus are unremarkable. No thoracic adenopathy Lungs/Pleura: No focal consolidation, pleural effusion, or pneumothorax. Upper Abdomen: No acute abnormality. 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 New subacute appearing pathologic fractures of the lytic lesion in the posterolateral right sixth and ninth ribs. Chronic pathologic compression fractures of T8, T10, and T12. Postoperative change of right lumpectomy. New predominantly low-density fluid collections in the right breast near the lumpectomy site measured 3.8 x 3.0 and 2.5 x 2.8 cm, respectively. Review of the MIP images confirms the above findings. IMPRESSION: 1. Negative for acute pulmonary embolism. 2. Postoperative change of right lumpectomy. Fluid collections in the right breast near the lumpectomy site. Favor postoperative seromas or hematomas. Superimposed infection is difficult to exclude. 3. Widespread osseous metastatic disease. New subacute appearing pathologic fractures of the lytic lesions in the posterolateral right sixth and ninth ribs. Electronically Signed   By: Minerva Fester M.D.   On: 06/26/2023 21:37   DG Chest Port 1 View  Result Date: 06/26/2023 CLINICAL DATA:  Sepsis EXAM: PORTABLE CHEST 1 VIEW COMPARISON:  08/19/2022 FINDINGS: Lungs are clear. No pneumothorax or pleural effusion. Cardiac size within normal limits. Pulmonary vascularity is normal. Pathologic fracture of the right sixth rib posterolaterally and left sixth rib anterolaterally with associated pleural thickening on the left is seen, better assessed on CT examination of 05/21/2023. Expansile lytic lesion within the right seventh rib also noted laterally. No acute bone abnormality. Surgical clips seen within the left axilla. IMPRESSION: 1. No active cardiopulmonary disease. 2. Pathologic fractures of the right sixth rib posterolaterally and lytic expansile lesions of the right seventh rib and left sixth rib anterolaterally with associated pleural thickening on the left, better assessed on CT examination of 05/21/2023. Electronically Signed   By: Helyn Numbers M.D.   On: 06/26/2023 19:37    Procedures Procedures    Medications Ordered in ED Medications  anastrozole (ARIMIDEX) tablet 1 mg (1 mg Oral Not Given 06/27/23 0952)  morphine (MS CONTIN) 12 hr tablet 60 mg (60 mg Oral Given 06/27/23 0955)  ALPRAZolam Prudy Feeler) tablet 0.5 mg (0.5 mg Oral Given 06/27/23 0219)  oxyCODONE (Oxy IR/ROXICODONE) immediate release tablet 5 mg (has no administration in time range)  multivitamin with minerals tablet 1 tablet (1 tablet Oral Given 06/27/23 0955)  enoxaparin (LOVENOX) injection 40 mg (40 mg Subcutaneous Patient Refused/Not Given 06/27/23 0954)  lactated ringers infusion ( Intravenous New Bag/Given 06/27/23 0123)  cefTRIAXone (ROCEPHIN) 2 g in sodium chloride 0.9 % 100 mL IVPB (2 g Intravenous New Bag/Given 06/27/23 1006)  vancomycin (VANCOREADY) IVPB 1500 mg/300 mL (has no administration in time range)  iohexol (OMNIPAQUE) 350 MG/ML injection 75 mL (75 mLs Intravenous Contrast Given 06/26/23 2037)  lactated  ringers bolus 1,000 mL (1,000 mLs Intravenous New Bag/Given 06/26/23 2251)  vancomycin (VANCOREADY) IVPB 1500 mg/300 mL (1,500 mg Intravenous New Bag/Given 06/27/23 0124)  potassium chloride SA (KLOR-CON M) CR tablet 40 mEq (40 mEq Oral Given 06/27/23 1136)    ED Course/ Medical Decision Making/ A&P                                 Medical Decision Making Amount and/or Complexity of Data Reviewed Labs: ordered. Radiology: ordered. ECG/medicine tests: ordered.  Risk Prescription drug management. Decision regarding hospitalization.   47 year old female with medical history significant for metastatic  breast cancer status post lumpectomy with radioactive seed and sentinel lymph node biopsy, currently on maintenance chemotherapy trastuzumab who presents to the emergency department with shortness of breath.  The patient states that at her right breast lumpectomy site she has noticed some mild redness and slight purulent drainage.  She denies any fevers.  She additionally states that she woke up today and felt very short of breath.  She has not had anything to eat or drink in the last 2 days and has had been having episodes of loose watery stools.  She felt diaphoretic and clammy, endorses mild right-sided chest discomfort.  On arrival, the patient was afebrile, tachycardic and tachypneic, heart rate 125, respiratory rate 23, hemodynamically stable blood pressure 119/88, saturating 100% on room air.  Physical exam Revealed a healing lumpectomy incision scar, evidence of breast cellulitis with potential breast abscess with purulent drainage coming out from a small lesion along the right lateral breast.  Differential diagnosis includes breast wall cellulitis, infected postoperative hematoma, breast abscess, pulmonary embolism given the patient's metastatic breast cancer, pneumonia, pneumothorax.  Initial EKG revealed sinus rhythm, ventricular rate 96, no acute ischemic changes.  A chest x-ray was  performed which revealed no active cardiopulmonary disease, pathologic fractures of the right sixth rib posterior laterally and lytic lesions of the right seventh rib and left 6 rib anterolaterally with associated pleural thickening.  Laboratory evaluation revealed COVID-19 influenza PCR testing negative, hCG negative, lactic acid initially 2.1, repeat downtrending to 1.3 after an IV fluid bolus, cardiac enzymes normal, BNP normal, CBC without leukocytosis or anemia, CMP with a mild anion gap acidosis with a bicarbonate of 20, anion gap of 17.  The patient also states that she has had a large amount of watery diarrhea over the last few days and she feels dehydrated.  Patient tachycardic on arrival, mucous membranes appear dry, with a mild anion gap acidosis and lactic acidosis.  Evidence as well for developing breast wall cellulitis with a small lesion which the patient states is draining purulence however has surrounding mild erythema and appears to have mild to minimal drainage. Lower suspicion for potential breast abscess. The patient was covered with IV vancomycin.  CTA PE study was ordered to further evaluate: IMPRESSION:  1. Negative for acute pulmonary embolism.  2. Postoperative change of right lumpectomy. Fluid collections in  the right breast near the lumpectomy site. Favor postoperative  seromas or hematomas. Superimposed infection is difficult to  exclude.  3. Widespread osseous metastatic disease. New subacute appearing  pathologic fractures of the lytic lesions in the posterolateral  right sixth and ninth ribs.    Considered admission for observation in the setting of the patient's likely dehydration and diarrheal illness.  Additionally, patient would benefit from nonemergent surgical consult given concern for potential infected seroma versus breast abscess status postlumpectomy vs postoperative hematoma.  Patient not meeting SIRS criteria, overall well-appearing following fluid  resuscitation.  Hospitalist medicine was consulted for admission the patient was subsequently admitted in stable condition.     Final Clinical Impression(s) / ED Diagnoses Final diagnoses:  Cellulitis of breast  Post-operative pain  Diarrhea, unspecified type  Dehydration    Rx / DC Orders ED Discharge Orders     None         Ernie Avena, MD 06/27/23 1228

## 2023-06-26 NOTE — H&P (Signed)
History and Physical    Patient: Natalie Griffin GNF:[id number] DOB: 06-08-1976 DOA: 06/26/2023 DOS: the patient was seen and examined on 06/26/2023 PCP: Serena Croissant, MD  Patient coming from: Home  Chief Complaint:  Chief Complaint  Patient presents with   Shortness of Breath   Near Syncope   Post-op Problem   HPI: Saphire A Casarella is a 47 y.o. female with medical history significant of left breast cancer status post breast conserving surgery in 2021 with current right breast cancer with metastasis to the bones status post lumpectomy currently on adjuvant therapy with Arimidex as well as radiation therapy to the spine patient had had right breast lumpectomy with radioactive seed localization on September 18 by Dr. Luisa Hart, she presented to the ER today with excessive diarrhea and dehydration.  She has had poor oral intake.  Also some nausea but no vomiting.  Denied hematochezia, denied melena, denied hematemesis.  Review of Systems: As mentioned in the history of present illness. All other systems reviewed and are negative. Past Medical History:  Diagnosis Date   Anemia    Anxiety    Bone metastasis    Breast cancer (HCC) 2021   Left breast invasive ductal carcinoma   Cancer (HCC) 2021   Left breast   Complication of anesthesia    pseudocholinesterase deficiency   Family history of adverse reaction to anesthesia    Father and Aunt have pseudocholinesterase deficiency - Trouble waking up from anesthesia   History of hiatal hernia    History of kidney stones    noted on CT Left nonobstructive   Pseudocholinesterase deficiency    Past Surgical History:  Procedure Laterality Date   BREAST BIOPSY Right 07/21/2022   [place] RT BREAST BX W LOC DEV 1ST LESION IMG BX SPEC US GUIDE 07/21/2022 [facility]   BREAST BIOPSY  06/08/2023   MM RT RADIOACTIVE SEED LOC MAMMO GUIDE 06/08/2023 [facility]   BREAST LUMPECTOMY WITH RADIOACTIVE SEED AND SENTINEL LYMPH NODE BIOPSY Left  06/04/2020   Procedure: LEFT BREAST LUMPECTOMY WITH RADIOACTIVE SEED AND SENTINEL LYMPH NODE MAPPING;  Surgeon: Harriette Bouillon, MD;  Location: [facility];  Service: General;  Laterality: Left;  PEC BLOCK, RNFA   BREAST LUMPECTOMY WITH RADIOACTIVE SEED LOCALIZATION Right 06/09/2023   Procedure: RIGHT BREAST LUMPECTOMY WITH RADIOACTIVE SEED LOCALIZATION;  Surgeon: Harriette Bouillon, MD;  Location: [facility];  Service: General;  Laterality: Right;   FEMUR IM NAIL Right 04/01/2021   Procedure: INTRAMEDULLARY (IM) NAIL FEMORAL;  Surgeon: Sheral Apley, MD;  Location: [facility];  Service: Orthopedics;  Laterality: Right;   ROBOTIC ASSISTED BILATERAL SALPINGO OOPHERECTOMY Bilateral 09/30/2022   Procedure: XI ROBOTIC ASSISTED BILATERAL SALPINGO OOPHORECTOMY;  Surgeon: Carver Fila, MD;  Location: [facility];  Service: Gynecology;  Laterality: Bilateral;   WISDOM TOOTH EXTRACTION     Social History:  reports that she quit smoking about 3 years ago. Her smoking use included cigarettes. She has never used smokeless tobacco. She reports that she does not currently use alcohol. She reports that she does not use drugs.  Allergies  Allergen Reactions   Adhesive [Tape] Other (See Comments)    Blistering with steri strips   Fentanyl Itching, Nausea Only and Rash    08/11/2022 reports removing patch two days ago. Experienced "rash, itching, nausea, headache and breathing" affected.      Phesgo [Pertuz-Trastuz-Hyaluron-Zzxf] Diarrhea and Rash    Phesgo side effect: Fixed drug eruption, profound diarrhea Therefore we will discontinue Phesgo and start  her on Herceptin Hylecta Inj   Tetanus Toxoid Anaphylaxis   Succinylcholine Other (See Comments)    Her Body does not have enough enzymes to carry it out of her body    Family History  Problem Relation Age of Onset   Colon cancer Neg Hx    Esophageal cancer Neg Hx    Stomach cancer Neg Hx    Rectal cancer Neg Hx     Prior to Admission  medications   Medication Sig Start Date End Date Taking? Authorizing Provider  ALPRAZolam Prudy Feeler) 0.5 MG tablet Take 1 tablet (0.5 mg total) by mouth 3 (three) times daily as needed for anxiety. 06/17/23  Yes Serena Croissant, MD  anastrozole (ARIMIDEX) 1 MG tablet Take 1 tablet (1 mg total) by mouth daily. 09/24/22  Yes Serena Croissant, MD  morphine (MS CONTIN) 60 MG 12 hr tablet Take 1 tablet (60 mg total) by mouth every 12 (twelve) hours. 06/08/23  Yes Serena Croissant, MD  Multiple Vitamin (ONE DAILY MULTIVITAMIN ADULT PO) Take 1 tablet by mouth daily.   Yes [provider]  oxyCODONE (OXY IR/ROXICODONE) 5 MG immediate release tablet Take 1 tablet (5 mg total) by mouth every 4 (four) hours as needed for severe pain. 06/17/23   Serena Croissant, MD    Physical Exam: Vitals:   06/26/23 1657 06/26/23 1702 06/26/23 1852 06/26/23 1900  BP: 119/88  129/82 128/74  Pulse: (!) 125  84 85  Resp: (!) 23  (!) 22 20  Temp: (!) 97.4 F (36.3 C)  99.2 F (37.3 C)   TempSrc: Oral  Oral   SpO2: 100%  96% 96%  Weight:  68 kg    Height:  5\' 5"  (1.651 m)     Constitutional: NAD, calm, comfortable Eyes: PERRL, lids and conjunctivae normal ENMT: Mucous membranes are moist. Posterior pharynx clear of any exudate or lesions.Normal dentition.  Neck: normal, supple, no masses, no thyromegaly Respiratory: clear to auscultation bilaterally, no wheezing, no crackles. Normal respiratory effort. No accessory muscle use.  Right breast lumpectomy site draining serous fluid Cardiovascular: Sinus tachycardia, no murmurs / rubs / gallops. No extremity edema. 2+ pedal pulses. No carotid bruits.  Abdomen: no tenderness, no masses palpated. No hepatosplenomegaly. Bowel sounds positive.  Musculoskeletal: Good range of motion, no joint swelling or tenderness, Skin: no rashes, lesions, ulcers. No induration Neurologic: CN 2-12 grossly intact. Sensation intact, DTR normal. Strength 5/5 in all 4.  Psychiatric: Normal judgment and  insight. Alert and oriented x 3. Normal mood  Data Reviewed:  Heart rate is 125, white count is 9.6 hemoglobin 13.0 platelet 411.  Lactic acid 2.1.  Sodium 137 potassium 3.7 chloride 100 CO2 20 glucose 135, BUN 15 creatinine 0.85 and calcium 10.9 alkaline phosphatase 302.  Acute viral screen is negative chest x-ray showed no acute findings.  Pathology fracture of the right sixth rib posterior laterally and lytic expansile lesion of the right seventh rib and left sixth rib anterior laterally with pleural thickening on the left CT angiogram of the chest showed postoperative change of right mastectomy lumpectomy fluid collections in the right breast anemia the lumpectomy site widespread osseous metastatic disease  Assessment and Plan:  #1 persistent diarrhea: Patient will be admitted for symptomatic management.  Check stool studies for possible C. difficile or other pathologies.  If no pathogens is found we will initiate Lomotil or other agent to slow it down.  Aggressively hydrate however.  #2 dehydration: Hydrate patient aggressively.  #3 metastatic breast cancer: Patient  is on adjuvant chemotherapy.  Also radiation therapy.  Continue.  #4 multiple pathologic fractures: Continue current treatment.  #5 hypercalcemia: Secondary to cancer.  Continue to monitor.    Advance Care Planning:   Code Status: Prior DNR  Consults: None  Family Communication: No family at bedside  Severity of Illness: The appropriate patient status for this patient is INPATIENT. Inpatient status is judged to be reasonable and necessary in order to provide the required intensity of service to ensure the patient's safety. The patient's presenting symptoms, physical exam findings, and initial radiographic and laboratory data in the context of their chronic comorbidities is felt to place them at high risk for further clinical deterioration. Furthermore, it is not anticipated that the patient will be medically stable for  discharge from the hospital within 2 midnights of admission.   * I certify that at the point of admission it is my clinical judgment that the patient will require inpatient hospital care spanning beyond 2 midnights from the point of admission due to high intensity of service, high risk for further deterioration and high frequency of surveillance required.*  AuthorLonia Blood, MD 06/26/2023 10:19 PM  For on call review [email or web address].

## 2023-06-26 NOTE — ED Triage Notes (Signed)
Pt had lumpectomy on 9/18, pt woke up today and felt very SOB, clammy, had diarrhea. Pt also reports drainage to her incision site on the right side.

## 2023-06-26 NOTE — ED Notes (Signed)
ED TO INPATIENT HANDOFF REPORT  ED Nurse Name and Phone #: Juliette Alcide [phone]  S Name/Age/Gender Natalie Griffin 47 y.o. female Room/Bed: 009C/009C  Code Status   Code Status: Prior  Home/SNF/Other Home Patient oriented to: self, place, time, and situation Is this baseline? Yes   Triage Complete: Triage complete  Chief Complaint Diarrhea [R19.7]  Triage Note Pt had lumpectomy on 9/18, pt woke up today and felt very SOB, clammy, had diarrhea. Pt also reports drainage to her incision site on the right side.    Allergies Allergies  Allergen Reactions   Adhesive [Tape] Other (See Comments)    Blistering with steri strips   Fentanyl Itching, Nausea Only and Rash    08/11/2022 reports removing patch two days ago. Experienced "rash, itching, nausea, headache and breathing" affected.      Phesgo [Pertuz-Trastuz-Hyaluron-Zzxf] Diarrhea and Rash    Phesgo side effect: Fixed drug eruption, profound diarrhea Therefore we will discontinue Phesgo and start her on Herceptin Hylecta Inj   Tetanus Toxoid Anaphylaxis   Succinylcholine Other (See Comments)    Her Body does not have enough enzymes to carry it out of her body    Level of Care/Admitting Diagnosis ED Disposition     ED Disposition  Admit   Condition  --   Comment  Hospital Area: [facility] [[id number]]  Level of Care: Med-Surg [16]  May admit patient to [facility] or [facility] if equivalent level of care is available:: No  Covid Evaluation: Asymptomatic - no recent exposure (last 10 days) testing not required  Diagnosis: Diarrhea [787.91.ICD-9-CM]  Admitting Physician: Rometta Emery [[id number]]  Attending Physician: Rometta Emery [[id number]]  Certification:: I certify this patient will need inpatient services for at least 2 midnights  Expected Medical Readiness: 06/28/2023          B Medical/Surgery History Past Medical History:  Diagnosis Date   Anemia    Anxiety    Bone metastasis     Breast cancer (HCC) 2021   Left breast invasive ductal carcinoma   Cancer (HCC) 2021   Left breast   Complication of anesthesia    pseudocholinesterase deficiency   Family history of adverse reaction to anesthesia    Father and Aunt have pseudocholinesterase deficiency - Trouble waking up from anesthesia   History of hiatal hernia    History of kidney stones    noted on CT Left nonobstructive   Pseudocholinesterase deficiency    Past Surgical History:  Procedure Laterality Date   BREAST BIOPSY Right 07/21/2022   [place] RT BREAST BX W LOC DEV 1ST LESION IMG BX SPEC US GUIDE 07/21/2022 [facility]   BREAST BIOPSY  06/08/2023   MM RT RADIOACTIVE SEED LOC MAMMO GUIDE 06/08/2023 [facility]   BREAST LUMPECTOMY WITH RADIOACTIVE SEED AND SENTINEL LYMPH NODE BIOPSY Left 06/04/2020   Procedure: LEFT BREAST LUMPECTOMY WITH RADIOACTIVE SEED AND SENTINEL LYMPH NODE MAPPING;  Surgeon: Harriette Bouillon, MD;  Location: [facility];  Service: General;  Laterality: Left;  PEC BLOCK, RNFA   BREAST LUMPECTOMY WITH RADIOACTIVE SEED LOCALIZATION Right 06/09/2023   Procedure: RIGHT BREAST LUMPECTOMY WITH RADIOACTIVE SEED LOCALIZATION;  Surgeon: Harriette Bouillon, MD;  Location: [facility];  Service: General;  Laterality: Right;   FEMUR IM NAIL Right 04/01/2021   Procedure: INTRAMEDULLARY (IM) NAIL FEMORAL;  Surgeon: Sheral Apley, MD;  Location: [facility];  Service: Orthopedics;  Laterality: Right;   ROBOTIC ASSISTED BILATERAL SALPINGO OOPHERECTOMY Bilateral 09/30/2022   Procedure:  XI ROBOTIC ASSISTED BILATERAL SALPINGO OOPHORECTOMY;  Surgeon: Carver Fila, MD;  Location: [facility];  Service: Gynecology;  Laterality: Bilateral;   WISDOM TOOTH EXTRACTION       A IV Location/Drains/Wounds Patient Lines/Drains/Airways Status     Active Line/Drains/Airways     Name Placement date Placement time Site Days   Peripheral IV 06/26/23 20 G 1.88" Anterior;Right;Upper Arm 06/26/23  1807  Arm  less  than 1   Incision - 4 Ports Abdomen 1: Right;Lateral 2: Umbilicus 3: Upper;Mid;Left 4: Left;Lateral;Lower 09/30/22  1450  -- 269            Intake/Output Last 24 hours No intake or output data in the 24 hours ending 06/26/23 2207  Labs/Imaging Results for orders placed or performed during the hospital encounter of 06/26/23 (from the past 48 hour(s))  Resp panel by RT-PCR (RSV, Flu A&B, Covid) Anterior Nasal Swab     Status: None   Collection Time: 06/26/23  6:07 PM   Specimen: Anterior Nasal Swab  Result Value Ref Range   SARS Coronavirus 2 by RT PCR NEGATIVE NEGATIVE   Influenza A by PCR NEGATIVE NEGATIVE   Influenza B by PCR NEGATIVE NEGATIVE    Comment: (NOTE) The Xpert Xpress SARS-CoV-2/FLU/RSV plus assay is intended as an aid in the diagnosis of influenza from Nasopharyngeal swab specimens and should not be used as a sole basis for treatment. Nasal washings and aspirates are unacceptable for Xpert Xpress SARS-CoV-2/FLU/RSV testing.  Fact Sheet for Patients: BloggerCourse.com  Fact Sheet for Healthcare Providers: SeriousBroker.it  This test is not yet approved or cleared by the [place] FDA and has been authorized for detection and/or diagnosis of SARS-CoV-2 by FDA under an Emergency Use Authorization (EUA). This EUA will remain in effect (meaning this test can be used) for the duration of the COVID-19 declaration under Section 564(b)(1) of the Act, 21 U.S.C. section 360bbb-3(b)(1), unless the authorization is terminated or revoked.     Resp Syncytial Virus by PCR NEGATIVE NEGATIVE    Comment: (NOTE) Fact Sheet for Patients: BloggerCourse.com  Fact Sheet for Healthcare Providers: SeriousBroker.it  This test is not yet approved or cleared by the [place] FDA and has been authorized for detection and/or diagnosis of SARS-CoV-2 by FDA under an Emergency Use  Authorization (EUA). This EUA will remain in effect (meaning this test can be used) for the duration of the COVID-19 declaration under Section 564(b)(1) of the Act, 21 U.S.C. section 360bbb-3(b)(1), unless the authorization is terminated or revoked.  Performed at [facility], [address] [place], [place] [address]   Comprehensive metabolic panel     Status: Abnormal   Collection Time: 06/26/23  6:09 PM  Result Value Ref Range   Sodium 137 135 - 145 mmol/L   Potassium 3.7 3.5 - 5.1 mmol/L   Chloride 100 98 - 111 mmol/L   CO2 20 (L) 22 - 32 mmol/L   Glucose, Bld 135 (H) 70 - 99 mg/dL    Comment: Glucose reference range applies only to samples taken after fasting for at least 8 hours.   BUN 15 6 - 20 mg/dL   Creatinine, Ser 6.21 0.44 - 1.00 mg/dL   Calcium 30.8 (H) 8.9 - 10.3 mg/dL   Total Protein 9.0 (H) 6.5 - 8.1 g/dL   Albumin 4.1 3.5 - 5.0 g/dL   AST 22 15 - 41 U/L   ALT 14 0 - 44 U/L   Alkaline Phosphatase 302 (H) 38 - 126  U/L   Total Bilirubin 0.5 0.3 - 1.2 mg/dL   GFR, Estimated >16 >10 mL/min    Comment: (NOTE) Calculated using the CKD-EPI Creatinine Equation (2021)    Anion gap 17 (H) 5 - 15    Comment: ELECTROLYTES REPEATED TO VERIFY Performed at [facility], [address], [place], [place] [address]   CBC with Differential     Status: Abnormal   Collection Time: 06/26/23  6:09 PM  Result Value Ref Range   WBC 9.6 4.0 - 10.5 K/uL   RBC 4.64 3.87 - 5.11 MIL/uL   Hemoglobin 13.0 12.0 - 15.0 g/dL   HCT 40.9 81.1 - 91.4 %   MCV 86.2 80.0 - 100.0 fL   MCH 28.0 26.0 - 34.0 pg   MCHC 32.5 30.0 - 36.0 g/dL   RDW 78.2 95.6 - 21.3 %   Platelets 411 (H) 150 - 400 K/uL   nRBC 0.0 0.0 - 0.2 %   Neutrophils Relative % 90 %   Neutro Abs 8.6 (H) 1.7 - 7.7 K/uL   Lymphocytes Relative 5 %   Lymphs Abs 0.5 (L) 0.7 - 4.0 K/uL   Monocytes Relative 4 %   Monocytes Absolute 0.4 0.1 - 1.0 K/uL   Eosinophils Relative 1 %   Eosinophils Absolute 0.1 0.0 - 0.5  K/uL   Basophils Relative 0 %   Basophils Absolute 0.0 0.0 - 0.1 K/uL   Immature Granulocytes 0 %   Abs Immature Granulocytes 0.03 0.00 - 0.07 K/uL    Comment: Performed at [facility], [address] [place], [place] [address]  hCG, serum, qualitative     Status: None   Collection Time: 06/26/23  6:09 PM  Result Value Ref Range   Preg, Serum NEGATIVE NEGATIVE    Comment:        THE SENSITIVITY OF THIS METHODOLOGY IS >10 mIU/mL. Performed at [facility], [address], [place], [place] [address]   Troponin I (High Sensitivity)     Status: None   Collection Time: 06/26/23  6:09 PM  Result Value Ref Range   Troponin I (High Sensitivity) 5 <18 ng/L    Comment: (NOTE) Elevated high sensitivity troponin I (hsTnI) values and significant  changes across serial measurements may suggest ACS but many other  chronic and acute conditions are known to elevate hsTnI results.  Refer to the "Links" section for chest pain algorithms and additional  guidance. Performed at [facility], [address], [place], [place] [address]   Brain natriuretic peptide     Status: None   Collection Time: 06/26/23  6:09 PM  Result Value Ref Range   B Natriuretic Peptide 20.4 0.0 - 100.0 pg/mL    Comment: Performed at [facility], [address] [place], [place] [address]  I-Stat Lactic Acid, ED     Status: Abnormal   Collection Time: 06/26/23  6:30 PM  Result Value Ref Range   Lactic Acid, Venous 2.1 (HH) 0.5 - 1.9 mmol/L   Comment NOTIFIED PHYSICIAN   Troponin I (High Sensitivity)     Status: None   Collection Time: 06/26/23  9:06 PM  Result Value Ref Range   Troponin I (High Sensitivity) 4 <18 ng/L    Comment: (NOTE) Elevated high sensitivity troponin I (hsTnI) values and significant  changes across serial measurements may suggest ACS but many other  chronic and acute conditions are known to elevate hsTnI results.  Refer to the "  Links" section for chest pain algorithms  and additional  guidance. Performed at [facility], [address] [place], [place] [address]   I-Stat Lactic Acid, ED     Status: None   Collection Time: 06/26/23  9:19 PM  Result Value Ref Range   Lactic Acid, Venous 1.3 0.5 - 1.9 mmol/L   CT Angio Chest PE W and/or Wo Contrast  Result Date: 06/26/2023 CLINICAL DATA:  Status post lumpectomy on September 18. Woke up today and felt very short of breath, clammy. Diarrhea. Drainage to incision site on the right. EXAM: CT ANGIOGRAPHY CHEST WITH CONTRAST TECHNIQUE: Multidetector CT imaging of the chest was performed using the standard protocol during bolus administration of intravenous contrast. Multiplanar CT image reconstructions and MIPs were obtained to evaluate the vascular anatomy. RADIATION DOSE REDUCTION: This exam was performed according to the departmental dose-optimization program which includes automated exposure control, adjustment of the mA and/or kV according to patient size and/or use of iterative reconstruction technique. CONTRAST:  75mL OMNIPAQUE IOHEXOL 350 MG/ML SOLN COMPARISON:  Chest radiograph earlier today and CT 05/21/2023 FINDINGS: Cardiovascular: Negative for acute pulmonary embolism. Normal caliber thoracic aorta. No pericardial effusion. Mediastinum/Nodes: Trachea and esophagus are unremarkable. No thoracic adenopathy Lungs/Pleura: No focal consolidation, pleural effusion, or pneumothorax. Upper Abdomen: No acute abnormality. 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 New subacute appearing pathologic fractures of the lytic lesion in the posterolateral right sixth and ninth ribs. Chronic pathologic compression fractures of T8, T10, and T12. Postoperative change of right lumpectomy. New predominantly low-density fluid collections in the right breast near the lumpectomy site measured 3.8 x 3.0 and 2.5 x 2.8 cm, respectively. Review  of the MIP images confirms the above findings. IMPRESSION: 1. Negative for acute pulmonary embolism. 2. Postoperative change of right lumpectomy. Fluid collections in the right breast near the lumpectomy site. Favor postoperative seromas or hematomas. Superimposed infection is difficult to exclude. 3. Widespread osseous metastatic disease. New subacute appearing pathologic fractures of the lytic lesions in the posterolateral right sixth and ninth ribs. Electronically Signed   By: Minerva Fester M.D.   On: 06/26/2023 21:37   DG Chest Port 1 View  Result Date: 06/26/2023 CLINICAL DATA:  Sepsis EXAM: PORTABLE CHEST 1 VIEW COMPARISON:  08/19/2022 FINDINGS: Lungs are clear. No pneumothorax or pleural effusion. Cardiac size within normal limits. Pulmonary vascularity is normal. Pathologic fracture of the right sixth rib posterolaterally and left sixth rib anterolaterally with associated pleural thickening on the left is seen, better assessed on CT examination of 05/21/2023. Expansile lytic lesion within the right seventh rib also noted laterally. No acute bone abnormality. Surgical clips seen within the left axilla. IMPRESSION: 1. No active cardiopulmonary disease. 2. Pathologic fractures of the right sixth rib posterolaterally and lytic expansile lesions of the right seventh rib and left sixth rib anterolaterally with associated pleural thickening on the left, better assessed on CT examination of 05/21/2023. Electronically Signed   By: Helyn Numbers M.D.   On: 06/26/2023 19:37    Pending Labs Unresulted Labs (From admission, onward)     Start     Ordered   06/26/23 1807  Gastrointestinal Panel by PCR , Stool  (Gastrointestinal Panel by PCR, Stool                                                                                                                                                     **  Does Not include CLOSTRIDIUM DIFFICILE testing. **If CDIFF testing is needed, place order from the "C Difficile  Testing" order set.**)  Once,   URGENT        06/26/23 1806   06/26/23 1807  C Difficile Quick Screen w PCR reflex  (C Difficile quick screen w PCR reflex panel )  Once, for 24 hours,   URGENT       References:    CDiff Information Tool   06/26/23 1806   06/26/23 1721  Blood Culture (routine x 2)  (Undifferentiated presentation (screening labs and basic nursing orders))  BLOOD CULTURE X 2,   STAT      06/26/23 1720            Vitals/Pain Today's Vitals   06/26/23 1657 06/26/23 1702 06/26/23 1852 06/26/23 1900  BP: 119/88  129/82 128/74  Pulse: (!) 125  84 85  Resp: (!) 23  (!) 22 20  Temp: (!) 97.4 F (36.3 C)  99.2 F (37.3 C)   TempSrc: Oral  Oral   SpO2: 100%  96% 96%  Weight:  68 kg    Height:  5\' 5"  (1.651 m)    PainSc:  10-Worst pain ever      Isolation Precautions Enteric precautions (UV disinfection)  Medications Medications  lactated ringers bolus 1,000 mL (has no administration in time range)  cefTRIAXone (ROCEPHIN) 1 g in sodium chloride 0.9 % 100 mL IVPB (has no administration in time range)  vancomycin (VANCOREADY) IVPB 1500 mg/300 mL (has no administration in time range)  iohexol (OMNIPAQUE) 350 MG/ML injection 75 mL (75 mLs Intravenous Contrast Given 06/26/23 2037)    Mobility walks     Focused Assessments Wound/skin   R Recommendations: See Admitting Provider Note  Report given to:   Additional Notes: still needs stool specimen, she is aware.

## 2023-06-27 DIAGNOSIS — N61 Mastitis without abscess: Secondary | ICD-10-CM

## 2023-06-27 LAB — COMPREHENSIVE METABOLIC PANEL
ALT: 9 U/L (ref 0–44)
AST: 16 U/L (ref 15–41)
Albumin: 3.3 g/dL — ABNORMAL LOW (ref 3.5–5.0)
Alkaline Phosphatase: 241 U/L — ABNORMAL HIGH (ref 38–126)
Anion gap: 13 (ref 5–15)
BUN: 13 mg/dL (ref 6–20)
CO2: 19 mmol/L — ABNORMAL LOW (ref 22–32)
Calcium: 9.3 mg/dL (ref 8.9–10.3)
Chloride: 105 mmol/L (ref 98–111)
Creatinine, Ser: 0.88 mg/dL (ref 0.44–1.00)
GFR, Estimated: >60 mL/min (ref >60)
Glucose, Bld: 119 mg/dL — ABNORMAL HIGH (ref 70–99)
Potassium: 3.4 mmol/L — ABNORMAL LOW (ref 3.5–5.1)
Sodium: 137 mmol/L (ref 135–145)
Total Bilirubin: 0.5 mg/dL (ref 0.3–1.2)
Total Protein: 7 g/dL (ref 6.5–8.1)

## 2023-06-27 LAB — CBC
HCT: 31.6 % — ABNORMAL LOW (ref 36.0–46.0)
Hemoglobin: 10.1 g/dL — ABNORMAL LOW (ref 12.0–15.0)
MCH: 27.3 pg (ref 26.0–34.0)
MCHC: 32 g/dL (ref 30.0–36.0)
MCV: 85.4 fL (ref 80.0–100.0)
Platelets: 345 10*3/uL (ref 150–400)
RBC: 3.7 MIL/uL — ABNORMAL LOW (ref 3.87–5.11)
RDW: 14.8 % (ref 11.5–15.5)
WBC: 7.2 10*3/uL (ref 4.0–10.5)
nRBC: 0 % (ref 0.0–0.2)

## 2023-06-27 LAB — MAGNESIUM: Magnesium: 2.1 mg/dL (ref 1.7–2.4)

## 2023-06-27 MED ORDER — SODIUM CHLORIDE 0.9 % IV SOLN
2.0000 g | INTRAVENOUS | Status: DC
  Administered 2023-06-27: 2 g via INTRAVENOUS
  Filled 2023-06-27 (×2): qty 20

## 2023-06-27 MED ORDER — POTASSIUM CHLORIDE CRYS ER 20 MEQ PO TBCR
40.0000 meq | EXTENDED_RELEASE_TABLET | Freq: Once | ORAL | Status: AC
  Administered 2023-06-27: 40 meq via ORAL
  Filled 2023-06-27: qty 2

## 2023-06-27 MED ORDER — VANCOMYCIN HCL 1500 MG/300ML IV SOLN
1500.0000 mg | INTRAVENOUS | Status: DC
  Administered 2023-06-28: 1500 mg via INTRAVENOUS
  Filled 2023-06-27: qty 300

## 2023-06-27 NOTE — Progress Notes (Signed)
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 Luisa Hart.  Patient presents to the ED with excessive diarrhea and dehydration.  Poor oral intake.  Also complaining of shortness of breath.     Assessment & Plan:   Principal Problem:   Diarrhea Active Problems:   Malignant neoplasm of upper-inner quadrant of left breast in female, estrogen receptor positive (HCC)   Metastatic cancer to spine (HCC)   Cancer associated pain  1-Diarrhea; gastroenteritis; resolved.   Anion Gap metabolic acidosis  In setting of Diarrhea.  Stable.   Dyspnea.  CT angio chest: Negative for PE Resolved.   Dehydration; encourage oral intake.    Metastatic   breast cancer Status post right lumpectomy 9/18 . CT chest: Postoperative changes of the right lumpectomy.  Fluid collection in the right breast near the lumpectomy site.  Favor postoperative seroma or hematoma.  Superimposed infection difficult to exclude. -follow up Out patient with oncology  Infection Right breast Post Lumpectomy;  She report significant amount of drainage from her right breast sx site,yellowish, brownish color.  Continue with vancomycin and Ceftriaxone.  Surgery consulted.   Multiples  pathologic fractures Pain management   Hypercalcemia:  Resolved with fluids.   Estimated body mass index is 24.96 kg/m as calculated from the following:   Height as of this encounter: 5\' 5"  (1.651 m).   Weight as of this encounter: 68 kg.   DVT prophylaxis: Lovenox Code Status: DNR Family  Communication: Disposition Plan:  Status is: Inpatient Remains inpatient appropriate because: management of infection     Consultants:  Surgery   Procedures:    Antimicrobials:    Subjective: She report diarrhea has resolved.  Main reason she came to hospital was because her right breast wound is draining a lot fluid yellowish and brown color.   Objective: Vitals:   06/26/23 1852 06/26/23 1900 06/26/23 2330 06/27/23 0424  BP: 129/82 128/74 (!) 126/92 104/65  Pulse: 84 85 (!) 113 88  Resp: (!) 22 20 20 17   Temp: 99.2 F (37.3 C)  98 F (36.7 C) 98.1 F (36.7 C)  TempSrc: Oral  Oral Oral  SpO2: 96% 96% 100% 98%  Weight:      Height:        Intake/Output Summary (Last 24 hours) at 06/27/2023 0736 Last data filed at 06/27/2023 0500 Gross per 24 hour  Intake 620.17 ml  Output --  Net 620.17 ml   Filed Weights   06/26/23 1702  Weight: 68 kg    Examination:  General exam: Appears calm and comfortable  Respiratory system: Clear to auscultation. Respiratory effort normal. Cardiovascular system: S1 & S2 heard, RRR. No JVD, murmurs, rubs, gallops or clicks. No pedal edema. Gastrointestinal system: Abdomen is nondistended, soft and nontender. No organomegaly or masses felt. Normal bowel sounds heard. Central nervous system: Alert and oriented.  Right breast: small open wound, draining fluid   Data Reviewed: I have personally reviewed following labs and imaging studies  CBC: Recent Labs  Lab 06/26/23 1809  WBC 9.6  NEUTROABS 8.6*  HGB 13.0  HCT 40.0  MCV 86.2  PLT  411*   Basic Metabolic Panel: Recent Labs  Lab 06/26/23 1809  NA 137  K 3.7  CL 100  CO2 20*  GLUCOSE 135*  BUN 15  CREATININE 0.85  CALCIUM 10.9*   GFR: Estimated Creatinine Clearance: 72.8 mL/min (by C-G formula based on SCr of 0.85 mg/dL). Liver Function Tests: Recent Labs  Lab 06/26/23 1809  AST 22  ALT 14  ALKPHOS 302*  BILITOT 0.5  PROT 9.0*  ALBUMIN 4.1   No results  for input(s): "LIPASE", "AMYLASE" in the last 168 hours. No results for input(s): "AMMONIA" in the last 168 hours. Coagulation Profile: No results for input(s): "INR", "PROTIME" in the last 168 hours. Cardiac Enzymes: No results for input(s): "CKTOTAL", "CKMB", "CKMBINDEX", "TROPONINI" in the last 168 hours. BNP (last 3 results) No results for input(s): "PROBNP" in the last 8760 hours. HbA1C: No results for input(s): "HGBA1C" in the last 72 hours. CBG: No results for input(s): "GLUCAP" in the last 168 hours. Lipid Profile: No results for input(s): "CHOL", "HDL", "LDLCALC", "TRIG", "CHOLHDL", "LDLDIRECT" in the last 72 hours. Thyroid Function Tests: No results for input(s): "TSH", "T4TOTAL", "FREET4", "T3FREE", "THYROIDAB" in the last 72 hours. Anemia Panel: No results for input(s): "VITAMINB12", "FOLATE", "FERRITIN", "TIBC", "IRON", "RETICCTPCT" in the last 72 hours. Sepsis Labs: Recent Labs  Lab 06/26/23 1830 06/26/23 2119  LATICACIDVEN 2.1* 1.3    Recent Results (from the past 240 hour(s))  Resp panel by RT-PCR (RSV, Flu A&B, Covid) Anterior Nasal Swab     Status: None   Collection Time: 06/26/23  6:07 PM   Specimen: Anterior Nasal Swab  Result Value Ref Range Status   SARS Coronavirus 2 by RT PCR NEGATIVE NEGATIVE Final   Influenza A by PCR NEGATIVE NEGATIVE Final   Influenza B by PCR NEGATIVE NEGATIVE Final    Comment: (NOTE) The Xpert Xpress SARS-CoV-2/FLU/RSV plus assay is intended as an aid in the diagnosis of influenza from Nasopharyngeal swab specimens and should not be used as a sole basis for treatment. Nasal washings and aspirates are unacceptable for Xpert Xpress SARS-CoV-2/FLU/RSV testing.  Fact Sheet for Patients: BloggerCourse.com  Fact Sheet for Healthcare Providers: SeriousBroker.it  This test is not yet approved or cleared by the [place] FDA and has been authorized for detection and/or diagnosis  of SARS-CoV-2 by FDA under an Emergency Use Authorization (EUA). This EUA will remain in effect (meaning this test can be used) for the duration of the COVID-19 declaration under Section 564(b)(1) of the Act, 21 U.S.C. section 360bbb-3(b)(1), unless the authorization is terminated or revoked.     Resp Syncytial Virus by PCR NEGATIVE NEGATIVE Final    Comment: (NOTE) Fact Sheet for Patients: BloggerCourse.com  Fact Sheet for Healthcare Providers: SeriousBroker.it  This test is not yet approved or cleared by the [place] FDA and has been authorized for detection and/or diagnosis of SARS-CoV-2 by FDA under an Emergency Use Authorization (EUA). This EUA will remain in effect (meaning this test can be used) for the duration of the COVID-19 declaration under Section 564(b)(1) of the Act, 21 U.S.C. section 360bbb-3(b)(1), unless the authorization is terminated or revoked.  Performed at [facility], [address], [place], [place] [address]   Blood Culture (routine x 2)     Status: None (Preliminary result)   Collection Time: 06/26/23  6:10 PM   Specimen: BLOOD  Result Value Ref Range Status   Specimen Description BLOOD BLOOD RIGHT ARM  Final   Special Requests   Final  BOTTLES DRAWN AEROBIC AND ANAEROBIC Blood Culture results may not be optimal due to an excessive volume of blood received in culture bottles   Culture   Final    NO GROWTH < 12 HOURS Performed at [facility], [address], [place], [place] [address]    Report Status PENDING  Incomplete  Blood Culture (routine x 2)     Status: None (Preliminary result)   Collection Time: 06/26/23  6:38 PM   Specimen: BLOOD  Result Value Ref Range Status   Specimen Description BLOOD BLOOD RIGHT HAND  Final   Special Requests AEROBIC BOTTLE ONLY Blood Culture adequate volume  Final   Culture   Final    NO GROWTH < 12 HOURS Performed at [facility],  [address] [place], [place] [address]    Report Status PENDING  Incomplete         Radiology Studies: CT Angio Chest PE W and/or Wo Contrast  Result Date: 06/26/2023 CLINICAL DATA:  Status post lumpectomy on September 18. Woke up today and felt very short of breath, clammy. Diarrhea. Drainage to incision site on the right. EXAM: CT ANGIOGRAPHY CHEST WITH CONTRAST TECHNIQUE: Multidetector CT imaging of the chest was performed using the standard protocol during bolus administration of intravenous contrast. Multiplanar CT image reconstructions and MIPs were obtained to evaluate the vascular anatomy. RADIATION DOSE REDUCTION: This exam was performed according to the departmental dose-optimization program which includes automated exposure control, adjustment of the mA and/or kV according to patient size and/or use of iterative reconstruction technique. CONTRAST:  75mL OMNIPAQUE IOHEXOL 350 MG/ML SOLN COMPARISON:  Chest radiograph earlier today and CT 05/21/2023 FINDINGS: Cardiovascular: Negative for acute pulmonary embolism. Normal caliber thoracic aorta. No pericardial effusion. Mediastinum/Nodes: Trachea and esophagus are unremarkable. No thoracic adenopathy Lungs/Pleura: No focal consolidation, pleural effusion, or pneumothorax. Upper Abdomen: No acute abnormality. 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 New subacute appearing pathologic fractures of the lytic lesion in the posterolateral right sixth and ninth ribs. Chronic pathologic compression fractures of T8, T10, and T12. Postoperative change of right lumpectomy. New predominantly low-density fluid collections in the right breast near the lumpectomy site measured 3.8 x 3.0 and 2.5 x 2.8 cm, respectively. Review of the MIP images confirms the above findings. IMPRESSION: 1. Negative for acute pulmonary embolism. 2. Postoperative change of right lumpectomy.  Fluid collections in the right breast near the lumpectomy site. Favor postoperative seromas or hematomas. Superimposed infection is difficult to exclude. 3. Widespread osseous metastatic disease. New subacute appearing pathologic fractures of the lytic lesions in the posterolateral right sixth and ninth ribs. Electronically Signed   By: Minerva Fester M.D.   On: 06/26/2023 21:37   DG Chest Port 1 View  Result Date: 06/26/2023 CLINICAL DATA:  Sepsis EXAM: PORTABLE CHEST 1 VIEW COMPARISON:  08/19/2022 FINDINGS: Lungs are clear. No pneumothorax or pleural effusion. Cardiac size within normal limits. Pulmonary vascularity is normal. Pathologic fracture of the right sixth rib posterolaterally and left sixth rib anterolaterally with associated pleural thickening on the left is seen, better assessed on CT examination of 05/21/2023. Expansile lytic lesion within the right seventh rib also noted laterally. No acute bone abnormality. Surgical clips seen within the left axilla. IMPRESSION: 1. No active cardiopulmonary disease. 2. Pathologic fractures of the right sixth rib posterolaterally and lytic expansile lesions of the right seventh rib and left sixth rib anterolaterally with associated pleural thickening on the  left, better assessed on CT examination of 05/21/2023. Electronically Signed   By: Helyn Numbers M.D.   On: 06/26/2023 19:37        Scheduled Meds:  anastrozole  1 mg Oral Daily   enoxaparin (LOVENOX) injection  40 mg Subcutaneous Daily   morphine  60 mg Oral Q12H   multivitamin with minerals  1 tablet Oral Daily   Continuous Infusions:  cefTRIAXone (ROCEPHIN)  IV     lactated ringers 125 mL/hr at 06/27/23 0123     LOS: 1 day    Time spent: 35 minutes    Kensington Rios A Alexx Mcburney, MD [facility]   If 7PM-7AM, please contact night-coverage [email or web address]  06/27/2023, 7:36 AM

## 2023-06-27 NOTE — Progress Notes (Signed)
Pharmacy Antibiotic Note  Natalie Griffin is a 47 y.o. female admitted on 06/26/2023 with cellulitis.  Pharmacy has been consulted for vancomycin dosing.  Plan: Vancomycin 1500 IV every 24 hours. (estAUC 487.9 using SCr 0.88 and Vd 0.72) Monitor clinical progress, cultures/sensitivities, renal function, abx plan Vancomycin levels as indicated   Height: 5\' 5"  (165.1 cm) Weight: 68 kg (150 lb) IBW/kg (Calculated) : 57  Temp (24hrs), Avg:98.2 F (36.8 C), Min:97.4 F (36.3 C), Max:99.2 F (37.3 C)  Recent Labs  Lab 06/26/23 1809 06/26/23 1830 06/26/23 2119 06/27/23 0709  WBC 9.6  --   --  7.2  CREATININE 0.85  --   --  0.88  LATICACIDVEN  --  2.1* 1.3  --     Estimated Creatinine Clearance: 70.4 mL/min (by C-G formula based on SCr of 0.88 mg/dL).    Allergies  Allergen Reactions   Adhesive [Tape] Other (See Comments)    Blistering with steri strips   Fentanyl Itching, Nausea Only and Rash    08/11/2022 reports removing patch two days ago. Experienced "rash, itching, nausea, headache and breathing" affected.      Phesgo [Pertuz-Trastuz-Hyaluron-Zzxf] Diarrhea and Rash    Phesgo side effect: Fixed drug eruption, profound diarrhea Therefore we will discontinue Phesgo and start her on Herceptin Hylecta Inj   Tetanus Toxoid Anaphylaxis   Succinylcholine Other (See Comments)    Her Body does not have enough enzymes to carry it out of her body    Antimicrobials this admission: 10/6 vancomycin >>   Dose adjustments this admission:  Microbiology results: 10/5 BCx: ng < 12 hr 10/5 COV/Flu/RSV: neg    Thank you for allowing pharmacy to be a part of this patient's care.   Signe Colt, PharmD 06/27/2023 11:17 AM  **Pharmacist phone directory can be found on [email or web address] listed under [facility]**

## 2023-06-27 NOTE — Progress Notes (Signed)
Subjective Known to our service - recent right breast seed lumpectomy 06/09/23; known Stage IV right breast cancer on chemo without progression. This by my partner, Dr. Luisa Hart.  She has been admitted yesterday with diarrhea and dehydration in this setting. She has developed R breast swelling and now spontaneous purulent drainage from lateral portion of breast. No fever/chills. Soreness but improved since spontaneous drainage.  Objective: Vital signs in last 24 hours: Temp:  [98 F (36.7 C)-98.3 F (36.8 C)] 98.3 F (36.8 C) (10/06 0754) Pulse Rate:  [88-113] 99 (10/06 0754) Resp:  [17-20] 19 (10/06 0754) BP: (104-128)/(65-92) 128/72 (10/06 0754) SpO2:  [97 %-100 %] 97 % (10/06 0754) Last BM Date : 06/26/23  Intake/Output from previous day: 10/05 0701 - 10/06 0700 In: 620.2 [P.O.:120; I.V.:200.2; IV Piggyback:300] Out: -  Intake/Output this shift: No intake/output data recorded.  Gen: NAD, comfortable CV: RRR Chest: R breast lumpectomy incision closed. Region lateral to this with 5 mm punctate opening actively draining purulent thin fluid. Visible cavity deep to skin which is likely the lumpectomy pocket/seroma. No significant blanching erythema laterally, some skin irritation from tape. Ext: SCDs in place  Lab Results: CBC  Recent Labs    06/26/23 1809 06/27/23 0709  WBC 9.6 7.2  HGB 13.0 10.1*  HCT 40.0 31.6*  PLT 411* 345   BMET Recent Labs    06/26/23 1809 06/27/23 0709  NA 137 137  K 3.7 3.4*  CL 100 105  CO2 20* 19*  GLUCOSE 135* 119*  BUN 15 13  CREATININE 0.85 0.88  CALCIUM 10.9* 9.3   PT/INR No results for input(s): "LABPROT", "INR" in the last 72 hours. ABG No results for input(s): "PHART", "HCO3" in the last 72 hours.  Invalid input(s): "PCO2", "PO2"  Studies/Results:  Anti-infectives: Anti-infectives (From admission, onward)    Start     Dose/Rate Route Frequency Ordered Stop   06/28/23 0000  vancomycin (VANCOREADY) IVPB 1500 mg/300 mL         1,500 mg 150 mL/hr over 120 Minutes Intravenous Every 24 hours 06/27/23 1115     06/27/23 0945  cefTRIAXone (ROCEPHIN) 2 g in sodium chloride 0.9 % 100 mL IVPB        2 g 200 mL/hr over 30 Minutes Intravenous Every 24 hours 06/27/23 0859     06/26/23 2145  cefTRIAXone (ROCEPHIN) 1 g in sodium chloride 0.9 % 100 mL IVPB  Status:  Discontinued        1 g 200 mL/hr over 30 Minutes Intravenous  Once 06/26/23 2143 06/27/23 0939   06/26/23 2145  vancomycin (VANCOREADY) IVPB 1500 mg/300 mL        1,500 mg 150 mL/hr over 120 Minutes Intravenous  Once 06/26/23 2144 06/27/23 0324   06/26/23 2130  doxycycline (VIBRA-TABS) tablet 100 mg  Status:  Discontinued        100 mg Oral  Once 06/26/23 2122 06/26/23 2143        Assessment/Plan: Patient Active Problem List   Diagnosis Date Noted   Diarrhea 06/26/2023   Cancer associated pain 03/24/2023   Closed wedge compression fracture of T10 vertebra (HCC) 12/29/2022   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 Luisa Hart is back in the morning covering our acute care service here and will defer final mgmt plans to his better judgement -All the above reviewed with her and her nurse whom also served as chaperone for entire encounter. All questions answered, she expressed understanding and agreement with plan  Please call in interim with any questions or concerns   LOS: 1 day    Marin Olp, MD [facility]

## 2023-06-28 ENCOUNTER — Telehealth: Payer: Self-pay | Admitting: Hematology and Oncology

## 2023-06-28 DIAGNOSIS — R197 Diarrhea, unspecified: Secondary | ICD-10-CM | POA: Diagnosis not present

## 2023-06-28 NOTE — Progress Notes (Signed)
Pt left AMA unbeknownst to staff. Did manage to leave a note stating she had left the unit since she had an appointment with her attorney. Charge RN spoke with patient. See nurse's note for details.

## 2023-06-28 NOTE — Telephone Encounter (Signed)
I discussed the results of the x-rays of her hip and back.  There was no pathological fracture. She for me that she was in the hospital with surgical wound issue and that she is going to be discharged. She is trying to get insurance so that she can continue her treatment on 07/15/2023.

## 2023-06-28 NOTE — Discharge Summary (Signed)
Physician Discharge Summary   Patient: Natalie Griffin MRN: [id number] DOB: Oct 02, 1975  Admit date:     06/26/2023  Discharge date: 06/28/23  Discharge Physician: Natalie Griffin   PCP: Natalie Croissant, MD   Recommendations at discharge:    Follow up with Natalie Griffin for further post op care.  Follow up with Natalie Griffin for her tx breast cancer.  Patient left Unit and didn't informed the nurse.   Discharge Diagnoses: Principal Problem:   Diarrhea Active Problems:   Malignant neoplasm of upper-inner quadrant of left breast in female, estrogen receptor positive (HCC)   Metastatic cancer to spine (HCC)   Cancer associated pain  Resolved Problems:   * No resolved hospital problems. 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 Luisa Griffin.  Patient presents to the ED with excessive diarrhea and dehydration.  Poor oral intake.  Also complaining of shortness of breath.      Assessment and Plan: 1-Diarrhea; gastroenteritis; resolved.    Anion Gap metabolic acidosis  In setting of Diarrhea.  Stable.    Dyspnea.  CT angio chest: Negative for PE Resolved.    Dehydration; encourage oral intake.    Hypokalemia; replaced orally.   Metastatic   breast cancer Status post right lumpectomy 9/18 . CT chest: Postoperative changes of the right lumpectomy.  Fluid collection in the right breast near the lumpectomy site.  Favor postoperative seroma or hematoma.  Superimposed infection difficult to exclude. -follow up Out patient with oncology   Infection Right breast Post Lumpectomy;  She report significant amount of drainage from her right breast sx site,yellowish, brownish  color.  Continue with vancomycin and Ceftriaxone.  Surgery consulted. Natalie Anice Paganini didn't recommend antibiotics.    Multiples  pathologic fractures Pain management    Hypercalcemia:  Resolved with fluids.           Consultants: Surgery   DISCHARGE MEDICATION:   Discharge Exam: Filed Weights   06/26/23 1702  Weight: 68 kg     Condition at discharge:  I was not able to evaluate patient prior to discharge. She left AMA  The results of significant diagnostics from this hospitalization (including imaging, microbiology, ancillary and laboratory) are listed below for reference.   Imaging Studies: CT Angio Chest PE W and/or Wo Contrast  Result Date: 06/26/2023 CLINICAL DATA:  Status post lumpectomy on September 18. Woke up today and felt very short of breath, clammy. Diarrhea. Drainage to incision site on the right. EXAM: CT ANGIOGRAPHY CHEST WITH CONTRAST TECHNIQUE: Multidetector CT imaging of the chest was performed using the standard protocol during bolus administration of intravenous contrast. Multiplanar CT image reconstructions and MIPs were obtained to evaluate the vascular anatomy. RADIATION DOSE REDUCTION: This exam was performed according to the departmental dose-optimization program which includes automated exposure control, adjustment of the mA and/or kV according to patient size and/or use of iterative reconstruction technique. CONTRAST:  75mL OMNIPAQUE IOHEXOL 350 MG/ML SOLN COMPARISON:  Chest radiograph earlier today and CT 05/21/2023 FINDINGS: Cardiovascular: Negative for acute pulmonary embolism. Normal caliber thoracic aorta. No pericardial effusion. Mediastinum/Nodes: Trachea and esophagus are unremarkable. No thoracic adenopathy Lungs/Pleura: No focal consolidation, pleural effusion, or pneumothorax. Upper Abdomen: No acute abnormality. Musculoskeletal: Widespread  DATA:  Patient presents for radioactive seed localization of a right  breast malignancy prior to surgical excision. EXAM: MAMMOGRAPHIC GUIDED RADIOACTIVE SEED LOCALIZATION OF THE RIGHT BREAST COMPARISON:  Previous exam(s). FINDINGS: Patient presents for radioactive seed localization prior to surgical excision. I met with the patient and we discussed the procedure of seed localization including benefits and alternatives. We discussed the high likelihood of a successful procedure. We discussed the risks of the procedure including infection, bleeding, tissue injury and further surgery. We discussed the low dose of radioactivity involved in the procedure. Informed, written consent was given. The usual time-out protocol was performed immediately prior to the procedure. Using mammographic guidance, sterile technique, 1% lidocaine and an I-125 radioactive seed, the right breast mass and associated coil shaped post biopsy marker clip were localized using a superior approach. The follow-up mammogram images confirm the seed in the expected location and were marked for Natalie. Luisa Griffin. Follow-up survey of the patient confirms presence of the radioactive seed. Order number of I-125 seed:  [id number]. Total activity:  0.247 millicuries.  Reference Date: 05/13/2023. The patient tolerated the procedure well and was [facility]. She was given instructions regarding seed removal. IMPRESSION: Radioactive seed localization of the right breast. No apparent complications. Electronically Signed   By: Amie Portland M.D.   On: 06/08/2023 13:35   ECHOCARDIOGRAM COMPLETE  Result Date: 06/07/2023    ECHOCARDIOGRAM REPORT   Patient Name:   Natalie Griffin Date of Exam: 06/07/2023 Medical Rec #:  [id number]         Height:       65.0 in Accession #:    [id number]        Weight:       148.0 lb Date of Birth:  1975-11-02         BSA:          1.741 m Patient Age:    47 years          BP:           124/81 mmHg Patient Gender: F                 HR:           55 bpm. Exam Location:  Outpatient  Procedure: 2D Echo, 3D Echo, Cardiac Doppler, Color Doppler and Strain Analysis Indications:    Z51.11 Encounter for antineoplastic chemotheraphy  History:        Patient has prior history of Echocardiogram examinations, most                 recent 10/23/2022. Metastatic cancer.  Sonographer:    Sheralyn Boatman RDCS Referring Phys: [phone] Natalie Griffin IMPRESSIONS  1. Inferior basal hypokinesis . Left ventricular ejection fraction, by estimation, is 55%. The left ventricle has low normal function. The left ventricle has no regional wall motion abnormalities. Left ventricular diastolic parameters were normal. The average left ventricular global longitudinal strain is -22.7 %. The global longitudinal strain is normal.  2. Right ventricular systolic function is mildly reduced. The right ventricular size is mildly enlarged.  3. The mitral valve is abnormal. Trivial mitral valve regurgitation. No evidence of mitral stenosis.  4. The aortic valve is tricuspid. Aortic valve regurgitation is not visualized. No aortic stenosis is present.  5. The inferior vena cava is dilated in size with >50% respiratory variability, suggesting right atrial pressure of 8 mmHg. FINDINGS  Left Ventricle: Inferior basal hypokinesis. Left ventricular ejection fraction, by estimation, is  Physician Discharge Summary   Patient: Natalie Griffin MRN: [id number] DOB: Oct 02, 1975  Admit date:     06/26/2023  Discharge date: 06/28/23  Discharge Physician: Natalie Griffin   PCP: Natalie Croissant, MD   Recommendations at discharge:    Follow up with Natalie Griffin for further post op care.  Follow up with Natalie Griffin for her tx breast cancer.  Patient left Unit and didn't informed the nurse.   Discharge Diagnoses: Principal Problem:   Diarrhea Active Problems:   Malignant neoplasm of upper-inner quadrant of left breast in female, estrogen receptor positive (HCC)   Metastatic cancer to spine (HCC)   Cancer associated pain  Resolved Problems:   * No resolved hospital problems. 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 Luisa Griffin.  Patient presents to the ED with excessive diarrhea and dehydration.  Poor oral intake.  Also complaining of shortness of breath.      Assessment and Plan: 1-Diarrhea; gastroenteritis; resolved.    Anion Gap metabolic acidosis  In setting of Diarrhea.  Stable.    Dyspnea.  CT angio chest: Negative for PE Resolved.    Dehydration; encourage oral intake.    Hypokalemia; replaced orally.   Metastatic   breast cancer Status post right lumpectomy 9/18 . CT chest: Postoperative changes of the right lumpectomy.  Fluid collection in the right breast near the lumpectomy site.  Favor postoperative seroma or hematoma.  Superimposed infection difficult to exclude. -follow up Out patient with oncology   Infection Right breast Post Lumpectomy;  She report significant amount of drainage from her right breast sx site,yellowish, brownish  color.  Continue with vancomycin and Ceftriaxone.  Surgery consulted. Natalie Anice Paganini didn't recommend antibiotics.    Multiples  pathologic fractures Pain management    Hypercalcemia:  Resolved with fluids.           Consultants: Surgery   DISCHARGE MEDICATION:   Discharge Exam: Filed Weights   06/26/23 1702  Weight: 68 kg     Condition at discharge:  I was not able to evaluate patient prior to discharge. She left AMA  The results of significant diagnostics from this hospitalization (including imaging, microbiology, ancillary and laboratory) are listed below for reference.   Imaging Studies: CT Angio Chest PE W and/or Wo Contrast  Result Date: 06/26/2023 CLINICAL DATA:  Status post lumpectomy on September 18. Woke up today and felt very short of breath, clammy. Diarrhea. Drainage to incision site on the right. EXAM: CT ANGIOGRAPHY CHEST WITH CONTRAST TECHNIQUE: Multidetector CT imaging of the chest was performed using the standard protocol during bolus administration of intravenous contrast. Multiplanar CT image reconstructions and MIPs were obtained to evaluate the vascular anatomy. RADIATION DOSE REDUCTION: This exam was performed according to the departmental dose-optimization program which includes automated exposure control, adjustment of the mA and/or kV according to patient size and/or use of iterative reconstruction technique. CONTRAST:  75mL OMNIPAQUE IOHEXOL 350 MG/ML SOLN COMPARISON:  Chest radiograph earlier today and CT 05/21/2023 FINDINGS: Cardiovascular: Negative for acute pulmonary embolism. Normal caliber thoracic aorta. No pericardial effusion. Mediastinum/Nodes: Trachea and esophagus are unremarkable. No thoracic adenopathy Lungs/Pleura: No focal consolidation, pleural effusion, or pneumothorax. Upper Abdomen: No acute abnormality. Musculoskeletal: Widespread  Physician Discharge Summary   Patient: Natalie Griffin MRN: [id number] DOB: Oct 02, 1975  Admit date:     06/26/2023  Discharge date: 06/28/23  Discharge Physician: Natalie Griffin   PCP: Natalie Croissant, MD   Recommendations at discharge:    Follow up with Natalie Griffin for further post op care.  Follow up with Natalie Griffin for her tx breast cancer.  Patient left Unit and didn't informed the nurse.   Discharge Diagnoses: Principal Problem:   Diarrhea Active Problems:   Malignant neoplasm of upper-inner quadrant of left breast in female, estrogen receptor positive (HCC)   Metastatic cancer to spine (HCC)   Cancer associated pain  Resolved Problems:   * No resolved hospital problems. 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 Luisa Griffin.  Patient presents to the ED with excessive diarrhea and dehydration.  Poor oral intake.  Also complaining of shortness of breath.      Assessment and Plan: 1-Diarrhea; gastroenteritis; resolved.    Anion Gap metabolic acidosis  In setting of Diarrhea.  Stable.    Dyspnea.  CT angio chest: Negative for PE Resolved.    Dehydration; encourage oral intake.    Hypokalemia; replaced orally.   Metastatic   breast cancer Status post right lumpectomy 9/18 . CT chest: Postoperative changes of the right lumpectomy.  Fluid collection in the right breast near the lumpectomy site.  Favor postoperative seroma or hematoma.  Superimposed infection difficult to exclude. -follow up Out patient with oncology   Infection Right breast Post Lumpectomy;  She report significant amount of drainage from her right breast sx site,yellowish, brownish  color.  Continue with vancomycin and Ceftriaxone.  Surgery consulted. Natalie Anice Paganini didn't recommend antibiotics.    Multiples  pathologic fractures Pain management    Hypercalcemia:  Resolved with fluids.           Consultants: Surgery   DISCHARGE MEDICATION:   Discharge Exam: Filed Weights   06/26/23 1702  Weight: 68 kg     Condition at discharge:  I was not able to evaluate patient prior to discharge. She left AMA  The results of significant diagnostics from this hospitalization (including imaging, microbiology, ancillary and laboratory) are listed below for reference.   Imaging Studies: CT Angio Chest PE W and/or Wo Contrast  Result Date: 06/26/2023 CLINICAL DATA:  Status post lumpectomy on September 18. Woke up today and felt very short of breath, clammy. Diarrhea. Drainage to incision site on the right. EXAM: CT ANGIOGRAPHY CHEST WITH CONTRAST TECHNIQUE: Multidetector CT imaging of the chest was performed using the standard protocol during bolus administration of intravenous contrast. Multiplanar CT image reconstructions and MIPs were obtained to evaluate the vascular anatomy. RADIATION DOSE REDUCTION: This exam was performed according to the departmental dose-optimization program which includes automated exposure control, adjustment of the mA and/or kV according to patient size and/or use of iterative reconstruction technique. CONTRAST:  75mL OMNIPAQUE IOHEXOL 350 MG/ML SOLN COMPARISON:  Chest radiograph earlier today and CT 05/21/2023 FINDINGS: Cardiovascular: Negative for acute pulmonary embolism. Normal caliber thoracic aorta. No pericardial effusion. Mediastinum/Nodes: Trachea and esophagus are unremarkable. No thoracic adenopathy Lungs/Pleura: No focal consolidation, pleural effusion, or pneumothorax. Upper Abdomen: No acute abnormality. Musculoskeletal: Widespread  DATA:  Patient presents for radioactive seed localization of a right  breast malignancy prior to surgical excision. EXAM: MAMMOGRAPHIC GUIDED RADIOACTIVE SEED LOCALIZATION OF THE RIGHT BREAST COMPARISON:  Previous exam(s). FINDINGS: Patient presents for radioactive seed localization prior to surgical excision. I met with the patient and we discussed the procedure of seed localization including benefits and alternatives. We discussed the high likelihood of a successful procedure. We discussed the risks of the procedure including infection, bleeding, tissue injury and further surgery. We discussed the low dose of radioactivity involved in the procedure. Informed, written consent was given. The usual time-out protocol was performed immediately prior to the procedure. Using mammographic guidance, sterile technique, 1% lidocaine and an I-125 radioactive seed, the right breast mass and associated coil shaped post biopsy marker clip were localized using a superior approach. The follow-up mammogram images confirm the seed in the expected location and were marked for Natalie. Luisa Griffin. Follow-up survey of the patient confirms presence of the radioactive seed. Order number of I-125 seed:  [id number]. Total activity:  0.247 millicuries.  Reference Date: 05/13/2023. The patient tolerated the procedure well and was released from the [facility]. She was given instructions regarding seed removal. IMPRESSION: Radioactive seed localization of the right breast. No apparent complications. Electronically Signed   By: Amie Portland M.D.   On: 06/08/2023 13:35   ECHOCARDIOGRAM COMPLETE  Result Date: 06/07/2023    ECHOCARDIOGRAM REPORT   Patient Name:   Natalie Griffin Date of Exam: 06/07/2023 Medical Rec #:  [id number]         Height:       65.0 in Accession #:    [id number]        Weight:       148.0 lb Date of Birth:  1975-11-02         BSA:          1.741 m Patient Age:    47 years          BP:           124/81 mmHg Patient Gender: F                 HR:           55 bpm. Exam Location:  Outpatient  Procedure: 2D Echo, 3D Echo, Cardiac Doppler, Color Doppler and Strain Analysis Indications:    Z51.11 Encounter for antineoplastic chemotheraphy  History:        Patient has prior history of Echocardiogram examinations, most                 recent 10/23/2022. Metastatic cancer.  Sonographer:    Sheralyn Boatman RDCS Referring Phys: [phone] Natalie Griffin IMPRESSIONS  1. Inferior basal hypokinesis . Left ventricular ejection fraction, by estimation, is 55%. The left ventricle has low normal function. The left ventricle has no regional wall motion abnormalities. Left ventricular diastolic parameters were normal. The average left ventricular global longitudinal strain is -22.7 %. The global longitudinal strain is normal.  2. Right ventricular systolic function is mildly reduced. The right ventricular size is mildly enlarged.  3. The mitral valve is abnormal. Trivial mitral valve regurgitation. No evidence of mitral stenosis.  4. The aortic valve is tricuspid. Aortic valve regurgitation is not visualized. No aortic stenosis is present.  5. The inferior vena cava is dilated in size with >50% respiratory variability, suggesting right atrial pressure of 8 mmHg. FINDINGS  Left Ventricle: Inferior basal hypokinesis. Left ventricular ejection fraction, by estimation, is  DATA:  Patient presents for radioactive seed localization of a right  breast malignancy prior to surgical excision. EXAM: MAMMOGRAPHIC GUIDED RADIOACTIVE SEED LOCALIZATION OF THE RIGHT BREAST COMPARISON:  Previous exam(s). FINDINGS: Patient presents for radioactive seed localization prior to surgical excision. I met with the patient and we discussed the procedure of seed localization including benefits and alternatives. We discussed the high likelihood of a successful procedure. We discussed the risks of the procedure including infection, bleeding, tissue injury and further surgery. We discussed the low dose of radioactivity involved in the procedure. Informed, written consent was given. The usual time-out protocol was performed immediately prior to the procedure. Using mammographic guidance, sterile technique, 1% lidocaine and an I-125 radioactive seed, the right breast mass and associated coil shaped post biopsy marker clip were localized using a superior approach. The follow-up mammogram images confirm the seed in the expected location and were marked for Natalie. Luisa Griffin. Follow-up survey of the patient confirms presence of the radioactive seed. Order number of I-125 seed:  [id number]. Total activity:  0.247 millicuries.  Reference Date: 05/13/2023. The patient tolerated the procedure well and was released from the [facility]. She was given instructions regarding seed removal. IMPRESSION: Radioactive seed localization of the right breast. No apparent complications. Electronically Signed   By: Amie Portland M.D.   On: 06/08/2023 13:35   ECHOCARDIOGRAM COMPLETE  Result Date: 06/07/2023    ECHOCARDIOGRAM REPORT   Patient Name:   Natalie Griffin Date of Exam: 06/07/2023 Medical Rec #:  [id number]         Height:       65.0 in Accession #:    [id number]        Weight:       148.0 lb Date of Birth:  1975-11-02         BSA:          1.741 m Patient Age:    47 years          BP:           124/81 mmHg Patient Gender: F                 HR:           55 bpm. Exam Location:  Outpatient  Procedure: 2D Echo, 3D Echo, Cardiac Doppler, Color Doppler and Strain Analysis Indications:    Z51.11 Encounter for antineoplastic chemotheraphy  History:        Patient has prior history of Echocardiogram examinations, most                 recent 10/23/2022. Metastatic cancer.  Sonographer:    Sheralyn Boatman RDCS Referring Phys: [phone] Natalie Griffin IMPRESSIONS  1. Inferior basal hypokinesis . Left ventricular ejection fraction, by estimation, is 55%. The left ventricle has low normal function. The left ventricle has no regional wall motion abnormalities. Left ventricular diastolic parameters were normal. The average left ventricular global longitudinal strain is -22.7 %. The global longitudinal strain is normal.  2. Right ventricular systolic function is mildly reduced. The right ventricular size is mildly enlarged.  3. The mitral valve is abnormal. Trivial mitral valve regurgitation. No evidence of mitral stenosis.  4. The aortic valve is tricuspid. Aortic valve regurgitation is not visualized. No aortic stenosis is present.  5. The inferior vena cava is dilated in size with >50% respiratory variability, suggesting right atrial pressure of 8 mmHg. FINDINGS  Left Ventricle: Inferior basal hypokinesis. Left ventricular ejection fraction, by estimation, is

## 2023-06-28 NOTE — Progress Notes (Signed)
Subjective/Chief Complaint: Pt admitted last night  Drainage from lumpectomy site  No F/C feels fine    Objective: Vital signs in last 24 hours:   Last BM Date : 06/26/23  Intake/Output from previous day: 10/06 0701 - 10/07 0700 In: 2229.8 [P.O.:700; I.V.:1250; IV Piggyback:279.8] Out: -  Intake/Output this shift: No intake/output data recorded.  Right breast incision intact  small opening lateral draining serous fluid no cellulitis  Decompressed   Lab Results:  Recent Labs    06/26/23 1809 06/27/23 0709  WBC 9.6 7.2  HGB 13.0 10.1*  HCT 40.0 31.6*  PLT 411* 345   BMET Recent Labs    06/26/23 1809 06/27/23 0709  NA 137 137  K 3.7 3.4*  CL 100 105  CO2 20* 19*  GLUCOSE 135* 119*  BUN 15 13  CREATININE 0.85 0.88  CALCIUM 10.9* 9.3   PT/INR No results for input(s): "LABPROT", "INR" in the last 72 hours. ABG No results for input(s): "PHART", "HCO3" in the last 72 hours.  Invalid input(s): "PCO2", "PO2"  Studies/Results: CT Angio Chest PE W and/or Wo Contrast  Result Date: 06/26/2023 CLINICAL DATA:  Status post lumpectomy on September 18. Woke up today and felt very short of breath, clammy. Diarrhea. Drainage to incision site on the right. EXAM: CT ANGIOGRAPHY CHEST WITH CONTRAST TECHNIQUE: Multidetector CT imaging of the chest was performed using the standard protocol during bolus administration of intravenous contrast. Multiplanar CT image reconstructions and MIPs were obtained to evaluate the vascular anatomy. RADIATION DOSE REDUCTION: This exam was performed according to the departmental dose-optimization program which includes automated exposure control, adjustment of the mA and/or kV according to patient size and/or use of iterative reconstruction technique. CONTRAST:  75mL OMNIPAQUE IOHEXOL 350 MG/ML SOLN COMPARISON:  Chest radiograph earlier today and CT 05/21/2023 FINDINGS: Cardiovascular: Negative for acute pulmonary embolism. Normal caliber thoracic  aorta. No pericardial effusion. Mediastinum/Nodes: Trachea and esophagus are unremarkable. No thoracic adenopathy Lungs/Pleura: No focal consolidation, pleural effusion, or pneumothorax. Upper Abdomen: No acute abnormality. 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 New subacute appearing pathologic fractures of the lytic lesion in the posterolateral right sixth and ninth ribs. Chronic pathologic compression fractures of T8, T10, and T12. Postoperative change of right lumpectomy. New predominantly low-density fluid collections in the right breast near the lumpectomy site measured 3.8 x 3.0 and 2.5 x 2.8 cm, respectively. Review of the MIP images confirms the above findings. IMPRESSION: 1. Negative for acute pulmonary embolism. 2. Postoperative change of right lumpectomy. Fluid collections in the right breast near the lumpectomy site. Favor postoperative seromas or hematomas. Superimposed infection is difficult to exclude. 3. Widespread osseous metastatic disease. New subacute appearing pathologic fractures of the lytic lesions in the posterolateral right sixth and ninth ribs. Electronically Signed   By: Minerva Fester M.D.   On: 06/26/2023 21:37   DG Chest Port 1 View  Result Date: 06/26/2023 CLINICAL DATA:  Sepsis EXAM: PORTABLE CHEST 1 VIEW COMPARISON:  08/19/2022 FINDINGS: Lungs are clear. No pneumothorax or pleural effusion. Cardiac size within normal limits. Pulmonary vascularity is normal. Pathologic fracture of the right sixth rib posterolaterally and left sixth rib anterolaterally with associated pleural thickening on the left is seen, better assessed on CT examination of 05/21/2023. Expansile lytic lesion within the right seventh rib also noted laterally. No acute bone abnormality. Surgical clips seen within the left axilla. IMPRESSION: 1. No active cardiopulmonary disease. 2. Pathologic fractures of the  right  sixth rib posterolaterally and lytic expansile lesions of the right seventh rib and left sixth rib anterolaterally with associated pleural thickening on the left, better assessed on CT examination of 05/21/2023. Electronically Signed   By: Helyn Numbers M.D.   On: 06/26/2023 19:37    Anti-infectives: Anti-infectives (From admission, onward)    Start     Dose/Rate Route Frequency Ordered Stop   06/28/23 0000  vancomycin (VANCOREADY) IVPB 1500 mg/300 mL        1,500 mg 150 mL/hr over 120 Minutes Intravenous Every 24 hours 06/27/23 1115     06/27/23 0945  cefTRIAXone (ROCEPHIN) 2 g in sodium chloride 0.9 % 100 mL IVPB        2 g 200 mL/hr over 30 Minutes Intravenous Every 24 hours 06/27/23 0859     06/26/23 2145  cefTRIAXone (ROCEPHIN) 1 g in sodium chloride 0.9 % 100 mL IVPB  Status:  Discontinued        1 g 200 mL/hr over 30 Minutes Intravenous  Once 06/26/23 2143 06/27/23 0939   06/26/23 2145  vancomycin (VANCOREADY) IVPB 1500 mg/300 mL        1,500 mg 150 mL/hr over 120 Minutes Intravenous  Once 06/26/23 2144 06/27/23 0324   06/26/23 2130  doxycycline (VIBRA-TABS) tablet 100 mg  Status:  Discontinued        100 mg Oral  Once 06/26/23 2122 06/26/23 2143       Assessment/Plan: s/p right breast lumpectomy with decompressed seroma  No infection No need for ABX  Apply pad daily or as needed  Pt instructed on care  Follow up next week - she will call the office   Ok for D/C   LOS: 2 days    Dortha Schwalbe MD  06/28/2023

## 2023-06-29 ENCOUNTER — Telehealth: Payer: Self-pay

## 2023-06-29 NOTE — Telephone Encounter (Signed)
Left voicemail returning call form Kayla with the [facility] regarding patient claim and documents that had been submitted.

## 2023-06-29 NOTE — Telephone Encounter (Signed)
Left voicemail inquiring if patient received email with [employer] needing her signature. Asked for call back if not received. ROI needed to complete disability request from [place].

## 2023-06-29 NOTE — Telephone Encounter (Signed)
Patient stopped by office to sign ROI. Informed patient left voicemail for [employer] rep in regards to questions about her claim. ROI and [employer] records request faxed to system wide HIM for processing. Patient informed. No further concerns at this time.

## 2023-06-30 ENCOUNTER — Other Ambulatory Visit: Payer: Self-pay

## 2023-07-01 LAB — CULTURE, BLOOD (ROUTINE X 2)
Culture: NO GROWTH
Culture: NO GROWTH
Special Requests: ADEQUATE

## 2023-07-06 ENCOUNTER — Encounter: Payer: Self-pay | Admitting: Hematology and Oncology

## 2023-07-06 ENCOUNTER — Other Ambulatory Visit (HOSPITAL_COMMUNITY): Payer: Self-pay

## 2023-07-06 MED ORDER — AMPHETAMINE-DEXTROAMPHETAMINE 10 MG PO TABS
5.0000 mg | ORAL_TABLET | Freq: Two times a day (BID) | ORAL | 0 refills | Status: DC | PRN
Start: 2023-07-06 — End: 2023-09-08
  Filled 2023-07-06: qty 60, 30d supply, fill #0

## 2023-07-13 ENCOUNTER — Encounter: Payer: Self-pay | Admitting: Hematology and Oncology

## 2023-07-13 ENCOUNTER — Inpatient Hospital Stay: Payer: 59

## 2023-07-13 NOTE — Progress Notes (Signed)
[facility] CSW Progress Note  Clinical Child psychotherapist contacted patient by phone to assess any presenting needs and to discuss holiday assistance. Patient is currently un-insured and discussed concerns with paying for chemotherapy in-between time. CSW will send referral for [employer]. Patient discussed financial impact of losing employment, with no current plans to return back to work. CSW resent referral for the [employer] grant to patient financial specialist Everlean Alstrom, patient meets eligibility due to her being uninsured. CSW discussed holiday assistance, patient is interested, follow up scheduled for 10/29. CSW informed patient that primary social worker M. Kirtland Bouchard will be returning soon and she will soon be direct contact.  Natalie Griffin, LCSWA Clinical Social Worker [facility]

## 2023-07-14 ENCOUNTER — Encounter: Payer: Self-pay | Admitting: Hematology and Oncology

## 2023-07-14 NOTE — Progress Notes (Signed)
Received referral from social work regarding [employer].  Called patient and introduced myself and to offer available resources. Discussed one-time $1000 [employer] grant to assist with personal expenses while going through treatment. Advised what is needed to apply and she will send via emai. Docusign will be sent to her to complete and return back. Discussed in detail expenses and how they are covered. Discussed expense she would like to submit and what would be needed.  Emailed her with my contact name and number for any additional financial questions or concerns.

## 2023-07-15 ENCOUNTER — Inpatient Hospital Stay: Payer: 59

## 2023-07-15 ENCOUNTER — Other Ambulatory Visit: Payer: Self-pay

## 2023-07-15 VITALS — BP 118/91 | HR 99 | Temp 98.3°F | Resp 16

## 2023-07-15 DIAGNOSIS — C50212 Malignant neoplasm of upper-inner quadrant of left female breast: Secondary | ICD-10-CM

## 2023-07-15 DIAGNOSIS — Z5112 Encounter for antineoplastic immunotherapy: Secondary | ICD-10-CM | POA: Diagnosis present

## 2023-07-15 MED ORDER — TRASTUZUMAB-HYALURONIDASE-OYSK 600-10000 MG-UNT/5ML ~~LOC~~ SOLN
600.0000 mg | Freq: Once | SUBCUTANEOUS | Status: AC
  Administered 2023-07-15: 600 mg via SUBCUTANEOUS
  Filled 2023-07-15: qty 5

## 2023-07-15 NOTE — Progress Notes (Signed)
[facility] CSW Progress Note  Patient sent CSW email confirming that she made contact with patient financial specialist Everlean Alstrom for the [employer]. Patient requested Mining engineer. CSW returned patient's email and left vm informing her that food bag will be placed at registration.  Marguerita Merles, LCSWA Clinical Social Worker [facility]

## 2023-07-20 ENCOUNTER — Inpatient Hospital Stay: Payer: 59

## 2023-07-20 NOTE — Progress Notes (Signed)
[facility] CSW Progress Note  Clinical Child psychotherapist  contacted patient by phone as planned  to discuss [employer]. Patient will send list via email to CSW. CSW informed patient that primary social worker M. Kirtland Bouchard has returned from leave and will be her POC if she needs anything going forward. CSW provided M. Kirtland Bouchard contact information via email.  Marguerita Merles, LCSWA Clinical Social Worker [facility]

## 2023-07-23 ENCOUNTER — Telehealth: Payer: Self-pay | Admitting: Licensed Clinical Social Worker

## 2023-07-23 NOTE — Telephone Encounter (Signed)
[facility] Clinical Social Work  CSW attempted to contact pt to re-introduce self and discuss support needs. No answer. Left VM with direct contact information.   Fae Blossom E Caleb Decock, LCSW

## 2023-07-26 ENCOUNTER — Telehealth: Payer: Self-pay

## 2023-07-26 NOTE — Telephone Encounter (Signed)
Attempted to reach Patient about inquiry regarding disability claim and records. Left voicemail. This office has not received any record requests since 06/21/23, and records were sent to the Disability Company on 07/02/23 by [facility] Office. Advised Patient to contact the disability representative. Provided Patient with email ([email or web address]) to have any additional forms that may be needed sent directly to this office

## 2023-07-30 ENCOUNTER — Telehealth: Payer: Self-pay

## 2023-07-30 NOTE — Telephone Encounter (Signed)
Notified Patient by voicemail of completion of requested information by [facility] for Disability Claim. Fax transmission confirmation received. Copy of forms emailed to Annice Pih at [employer]. Copy of forms emailed to Patient.

## 2023-08-11 ENCOUNTER — Other Ambulatory Visit: Payer: Self-pay | Admitting: Hematology and Oncology

## 2023-08-12 ENCOUNTER — Inpatient Hospital Stay: Payer: 59

## 2023-08-12 ENCOUNTER — Inpatient Hospital Stay: Payer: 59 | Admitting: Hematology and Oncology

## 2023-08-12 ENCOUNTER — Other Ambulatory Visit (HOSPITAL_COMMUNITY): Payer: Self-pay

## 2023-08-12 ENCOUNTER — Encounter: Payer: Self-pay | Admitting: Hematology and Oncology

## 2023-08-12 MED ORDER — MORPHINE SULFATE ER 60 MG PO TBCR
60.0000 mg | EXTENDED_RELEASE_TABLET | Freq: Two times a day (BID) | ORAL | 0 refills | Status: DC
Start: 2023-08-12 — End: 2023-09-08
  Filled 2023-08-12: qty 60, 30d supply, fill #0
  Filled 2023-09-07: qty 60, 30d supply, fill #1

## 2023-08-12 MED ORDER — OXYCODONE HCL 5 MG PO TABS
5.0000 mg | ORAL_TABLET | ORAL | 0 refills | Status: DC | PRN
Start: 2023-08-12 — End: 2023-09-08
  Filled 2023-08-12: qty 120, 20d supply, fill #0

## 2023-08-13 ENCOUNTER — Telehealth: Payer: Self-pay | Admitting: Hematology and Oncology

## 2023-08-13 ENCOUNTER — Encounter: Payer: Self-pay | Admitting: Hematology and Oncology

## 2023-08-13 NOTE — Telephone Encounter (Signed)
 Left patient a vm regarding upcoming appointment

## 2023-08-14 ENCOUNTER — Other Ambulatory Visit: Payer: Self-pay

## 2023-08-16 ENCOUNTER — Ambulatory Visit: Payer: Self-pay

## 2023-08-16 ENCOUNTER — Inpatient Hospital Stay: Payer: 59 | Attending: Hematology and Oncology

## 2023-08-16 ENCOUNTER — Inpatient Hospital Stay (HOSPITAL_BASED_OUTPATIENT_CLINIC_OR_DEPARTMENT_OTHER): Payer: Self-pay | Admitting: Hematology and Oncology

## 2023-08-16 ENCOUNTER — Encounter: Payer: Self-pay | Admitting: Hematology and Oncology

## 2023-08-16 ENCOUNTER — Other Ambulatory Visit: Payer: Self-pay

## 2023-08-16 VITALS — BP 118/74 | HR 84 | Temp 98.1°F | Resp 18 | Ht 65.0 in | Wt 146.2 lb

## 2023-08-16 DIAGNOSIS — C781 Secondary malignant neoplasm of mediastinum: Secondary | ICD-10-CM | POA: Insufficient documentation

## 2023-08-16 DIAGNOSIS — C50212 Malignant neoplasm of upper-inner quadrant of left female breast: Secondary | ICD-10-CM | POA: Diagnosis present

## 2023-08-16 DIAGNOSIS — G893 Neoplasm related pain (acute) (chronic): Secondary | ICD-10-CM | POA: Insufficient documentation

## 2023-08-16 DIAGNOSIS — C7951 Secondary malignant neoplasm of bone: Secondary | ICD-10-CM | POA: Diagnosis not present

## 2023-08-16 DIAGNOSIS — Z5112 Encounter for antineoplastic immunotherapy: Secondary | ICD-10-CM | POA: Insufficient documentation

## 2023-08-16 DIAGNOSIS — Z170 Estrogen receptor positive status [ER+]: Secondary | ICD-10-CM | POA: Insufficient documentation

## 2023-08-16 DIAGNOSIS — C50411 Malignant neoplasm of upper-outer quadrant of right female breast: Secondary | ICD-10-CM | POA: Diagnosis not present

## 2023-08-16 MED ORDER — TRASTUZUMAB-HYALURONIDASE-OYSK 600-10000 MG-UNT/5ML ~~LOC~~ SOLN
600.0000 mg | Freq: Once | SUBCUTANEOUS | Status: AC
  Administered 2023-08-16: 600 mg via SUBCUTANEOUS
  Filled 2023-08-16: qty 5

## 2023-08-16 MED ORDER — SODIUM CHLORIDE 0.9 % IV SOLN: Freq: Once | INTRAVENOUS | Status: DC

## 2023-08-16 NOTE — Telephone Encounter (Signed)
Called and rescheduled patient lab appt for 11/26 @ 315.Natalie KitchenMarland Griffin

## 2023-08-16 NOTE — Assessment & Plan Note (Signed)
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  Patient refused adjuvant chemo, adjuvant radiation and adjuvant antiestrogen therapy.   Patient went to orthopedics for right hip pain.  Imaging revealed bone metastasis.  CT CAP: Cortical destruction of the right femoral neck consistent with skeletal metastases at risk for pathologic fracture.  Lucent lesion in the left iliac bone and L3 vertebral body consistent with multifocal skeletal metastases. 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%, HER2 3+ positive, Ki-67 35%    Treatment plan change: Oophorectomy: 09/30/2022 Current treatment: Anastrozole, Herceptin Hylecta Patient refused switching from Herceptin to Kadcyla (her insurance denied Herceptin Hylecta and patient does not want to receive it in IV form), stopped Herceptin (last given 09/17/2022) Neratinib started 01/05/2023 discontinued 02/08/2023 resumed Herceptin Hylecta 04/20/2023   Pain control: MS Contin and Dilaudid    Hospitalization 03/24/2023-03/27/2023 increased pain in the back: Vertebral mets and compression fractures: Radiation to lumbar spine completed 04/12/2023   Elevated alkaline  phosphatase: Secondary to bone metastases and hypercalcemia.  She does not want to take Xgeva.    Current treatment: Herceptin Hylecta every 4 weeks CT CAP 05/27/2023: Increase sclerosis of bone metastases. CT angio 06/26/2023: No PE widespread bone metastases.  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 Patient lost her job at [employer] and is concerned about her insurance.  We will request our case manager/social work to help her with Medicaid application.

## 2023-08-16 NOTE — Patient Instructions (Signed)
[facility] - A DEPT OF [facility]  Discharge Instructions: Thank you for choosing [facility] to provide your oncology and hematology care.   If you have a lab appointment with the Cancer Center, please go directly to the Cancer Center and check in at the registration area.   Wear comfortable clothing and clothing appropriate for easy access to any Portacath or PICC line.   We strive to give you quality time with your provider. You may need to reschedule your appointment if you arrive late (15 or more minutes).  Arriving late affects you and other patients whose appointments are after yours.  Also, if you miss three or more appointments without notifying the office, you may be dismissed from the clinic at the provider's discretion.      For prescription refill requests, have your pharmacy contact our office and allow 72 hours for refills to be completed.    Today you received the following chemotherapy and/or immunotherapy agent: Trastuzumab Hyaluronidase (Herceptin Hylecta).   To help prevent nausea and vomiting after your treatment, we encourage you to take your nausea medication as directed.  BELOW ARE SYMPTOMS THAT SHOULD BE REPORTED IMMEDIATELY: *FEVER GREATER THAN 100.4 F (38 C) OR HIGHER *CHILLS OR SWEATING *NAUSEA AND VOMITING THAT IS NOT CONTROLLED WITH YOUR NAUSEA MEDICATION *UNUSUAL SHORTNESS OF BREATH *UNUSUAL BRUISING OR BLEEDING *URINARY PROBLEMS (pain or burning when urinating, or frequent urination) *BOWEL PROBLEMS (unusual diarrhea, constipation, pain near the anus) TENDERNESS IN MOUTH AND THROAT WITH OR WITHOUT PRESENCE OF ULCERS (sore throat, sores in mouth, or a toothache) UNUSUAL RASH, SWELLING OR PAIN  UNUSUAL VAGINAL DISCHARGE OR ITCHING   Items with * indicate a potential emergency and should be followed up as soon as possible or go to the Emergency Department if any problems should occur.  Please show the  CHEMOTHERAPY ALERT CARD or IMMUNOTHERAPY ALERT CARD at check-in to the Emergency Department and triage nurse.  Should you have questions after your visit or need to cancel or reschedule your appointment, please contact [facility] - A DEPT OF [facility]  Dept: [phone]  and follow the prompts.  Office hours are 8:00 a.m. to 4:30 p.m. Monday - Friday. Please note that voicemails left after 4:00 p.m. may not be returned until the following business day.  We are closed weekends and major holidays. You have access to a nurse at all times for urgent questions. Please call the main number to the clinic Dept: [phone] and follow the prompts.   For any non-urgent questions, you may also contact your provider using MyChart. We now offer e-Visits for anyone 22 and older to request care online for non-urgent symptoms. For details visit [email or web address].   Also download the MyChart app! Go to the app store, search "MyChart", open the app, select [place], and log in with your MyChart username and password.  Trastuzumab; Hyaluronidase Injection What is this medication? TRASTUZUMAB; HYALURONIDASE (tras TOO zoo mab; hye al ur ON i dase) treats breast cancer. Trastuzumab works by blocking a protein that causes cancer cells to grow and multiply. This helps to slow or stop the spread of cancer cells. Hyaluronidase works by increasing the absorption of other medications in the body to help them work better. It is a combination medication that contains a monoclonal antibody. This medicine may be used for other purposes; ask your health care provider or pharmacist if you have questions. COMMON BRAND NAME(S):  HERCEPTIN HYLECTA What should I tell my care team before I take this medication? They need to know if you have any of these conditions: Heart failure Lung disease An unusual or allergic reaction to trastuzumab, or other medications, foods, dyes, or  preservatives Pregnant or trying to get pregnant Breast-feeding How should I use this medication? This medication is injected under the skin. It is given by your care team in a hospital or clinic setting. Talk to your care team about the use of this medication in children. It is not approved for use in children. Overdosage: If you think you have taken too much of this medicine contact a poison control center or emergency room at once. NOTE: This medicine is only for you. Do not share this medicine with others. What if I miss a dose? Keep appointments for follow-up doses. It is important not to miss your dose. Call your care team if you are unable to keep an appointment. What may interact with this medication? Certain types of chemotherapy, such as daunorubicin, doxorubicin, epirubicin, idarubicin This list may not describe all possible interactions. Give your health care provider a list of all the medicines, herbs, non-prescription drugs, or dietary supplements you use. Also tell them if you smoke, drink alcohol, or use illegal drugs. Some items may interact with your medicine. What should I watch for while using this medication? Your condition will be monitored carefully while you are receiving this medication. This medication may make you feel generally unwell. This is not uncommon as chemotherapy can affect healthy cells as well as cancer cells. Report any side effects. Continue your course of treatment even though you feel ill unless your care team tells you to stop. This medication may increase your risk of getting an infection. Call your care team for advice if you get a fever, chills, sore throat, or other symptoms of a cold or flu. Do not treat yourself. Try to avoid being around people who are sick. Avoid taking medications that contain aspirin, acetaminophen, ibuprofen, naproxen, or ketoprofen unless instructed by your care team. These medications may hide a fever. Talk to your care team  if you may be pregnant. Serious birth defects can occur if you take this medication during pregnancy and for 7 months after the last dose. You will need a negative pregnancy test before starting this medication. Contraception is recommended while taking this medication and for 7 months after the last dose. Your care team can help you find the option that works for you. Do not breastfeed while taking this medication or for 7 months after the last dose. What side effects may I notice from receiving this medication? Side effects that you should report to your care team as soon as possible: Allergic reactions or angioedema--skin rash, itching or hives, swelling of the face, eyes, lips, tongue, arms, or legs, trouble swallowing or breathing Dry cough, shortness of breath or trouble breathing Heart failure--shortness of breath, swelling of the ankles, feet, or hands, sudden weight gain, unusual weakness or fatigue Heart rhythm changes--fast or irregular heartbeat, dizziness, feeling faint or lightheaded, chest pain, trouble breathing Increase in blood pressure Infection--fever, chills, cough, or sore throat Side effects that usually do not require medical attention (report these to your care team if they continue or are bothersome): Diarrhea Hair loss Headache Muscle pain Nausea Unusual weakness or fatigue This list may not describe all possible side effects. Call your doctor for medical advice about side effects. You may report side effects to FDA  at 1-800-FDA-1088. Where should I keep my medication? This medication is given in a hospital or clinic. It will not be stored at home. NOTE: This sheet is a summary. It may not cover all possible information. If you have questions about this medicine, talk to your doctor, pharmacist, or health care provider.  2024 Elsevier/Gold Standard (2022-01-20 00:00:00)

## 2023-08-16 NOTE — Progress Notes (Signed)
Patient Care Team: Serena Croissant, MD as PCP - General (Hematology and Oncology) Donnelly Angelica, RN as Oncology Nurse Navigator Pershing Proud, RN as Oncology Nurse Navigator Rachel Moulds, MD as Consulting Physician (Hematology and Oncology)  DIAGNOSIS:  Encounter Diagnoses  Name Primary?   Malignant neoplasm of upper-inner quadrant of left breast in female, estrogen receptor positive (HCC) Yes   Metastatic cancer to spine (HCC)     SUMMARY OF ONCOLOGIC HISTORY: Oncology History  Malignant neoplasm of upper-inner quadrant of left breast in female, estrogen receptor positive (HCC)  04/11/2020 Initial Diagnosis   Patient palpated a left breast lump. Mammogram on 03/08/20 showed a 3.0cm mass at the 11 o'clock position with no axillary adenopathy. 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    02/2021 Relapse/Recurrence   Patient went to orthopedics for right hip pain.  Imaging revealed bone metastasis.  CT CAP: Cortical destruction of the right femoral neck consistent with skeletal metastases at risk for pathologic fracture.  Lucent lesion in the left iliac bone and L3 vertebral body consistent with multifocal skeletal metastases.   04/01/2021 Surgery   04/01/2021: Femoral intramedullary nail  Pathology review: Metastatic breast cancer ER 2% PR 0% HER2 3+ positive   04/08/2021 -  Anti-estrogen oral therapy   Tamoxifen 20 mg was prescribed on 04/08/2021 (discontinued by patient because she felt that the risks and benefits did not support it )--resumed in 07/2022  based on pathology results from right breast biopsy   05/14/2021 -  Chemotherapy   Subcutaneous Herceptin Perjeta (Phesgo) injection started 05/14/2021, switched to Herceptin Hylecta 06/11/2021 (Fixed drug eruption, profound diarrhea to Phesgo)     05/20/2021 - 06/03/2021 Radiation Therapy   Palliative radiation to the iliac bone and hip   02/04/2022 Imaging   MRI thoracic spine 02/04/2022: Numerous bone metastases thoracic spine cervical and lumbar spines largest T5, T8, T11.  Chronic compression fractures T4, T5, T10 and T12    05/07/2022 Imaging   CT chest abdomen pelvis: No new or progressive bone metastases.  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   07/29/2022 PET scan   IMPRESSION: 1. Hypermetabolic mass in the posterior deep RIGHT breast consistent primary breast carcinoma. 2. Central nodal mediastinal metastasis to the high LEFT prevascular space and LEFT super clavicular node. 3. 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  [name] recommended changing treatment to Kadcyla and Anisa wanted to wait until after the holidays.  She will discuss further with Dr. Pamelia Hoit in January.   11/19/2022 - 11/19/2022 Chemotherapy   Patient is on Treatment Plan : BREAST Trastuzumab IV (8/6) or SQ (600) D1 q21d     04/20/2023 -  Chemotherapy   Patient is on Treatment Plan : BREAST MAINTENANCE Trastuzumab IV (6) or SQ (600) D1 q21d x 13 cycles  CHIEF COMPLIANT: Follow-up on Herceptin  HISTORY OF PRESENT ILLNESS:  History of Present Illness   The patient, with a history of met breast cancer, presents for a routine follow-up. She expresses gratitude for the assistance she has received from a case worker, which has  significantly improved her quality of life. She has been able to attend a retreat and has received financial assistance for rent. She has also made a new friend, a fellow breast cancer patient, with whom she plans to spend Thanksgiving.  The patient reports a period of two weeks without pain, which she describes as "amazing." She attributes this improvement to better eating habits, including cutting out sugar and processed foods, and not having to lift heavy people. She has lost a significant amount of weight. She currently experiences intermittent pain in the back, shoulder, and scapula area, as well as in the ribs.  The patient has had a cough for about three weeks, which she believes is due to seasonal changes. She has been coughing up thick, heavy mucus, but the condition is improving. She also reports a period of extreme fatigue, during which she slept for most of two days. She had chills and a fever at the time, but felt better after resting.         ALLERGIES:  is allergic to adhesive [tape], fentanyl, phesgo [pertuz-trastuz-hyaluron-zzxf], tetanus toxoid, and succinylcholine.  MEDICATIONS:  Current Outpatient Medications  Medication Sig Dispense Refill   ALPRAZolam (XANAX) 0.5 MG tablet Take 1 tablet (0.5 mg total) by mouth 3 (three) times daily as needed for anxiety. 90 tablet 3   amphetamine-dextroamphetamine (ADDERALL) 10 MG tablet Take 1/2-1 tablet (5-10 mg total) by mouth 2 (two) times daily as needed. 60 tablet 0   anastrozole (ARIMIDEX) 1 MG tablet Take 1 tablet (1 mg total) by mouth daily. 90 tablet 3   morphine (MS CONTIN) 60 MG 12 hr tablet Take 1 tablet (60 mg total) by mouth every 12 (twelve) hours. 120 tablet 0   Multiple Vitamin (ONE DAILY MULTIVITAMIN ADULT PO) Take 1 tablet by mouth daily.     oxyCODONE (OXY IR/ROXICODONE) 5 MG immediate release tablet Take 1 tablet (5 mg total) by mouth every 4 (four) hours as needed for severe pain (pain score 7-10). 120 tablet 0   No  current facility-administered medications for this visit.   Facility-Administered Medications Ordered in Other Visits  Medication Dose Route Frequency Provider Last Rate Last Admin   0.9 %  sodium chloride infusion   Intravenous Once Serena Croissant, MD       trastuzumab-hyaluronidase-oysk (HERCEPTIN HYLECTA) 600-10000 MG-UNT/5ML chemo SQ injection 600 mg  600 mg Subcutaneous Once Serena Croissant, MD        PHYSICAL EXAMINATION: ECOG PERFORMANCE STATUS: 1 - Symptomatic but completely ambulatory  Vitals:   08/16/23 1023  BP: 118/74  Pulse: 84  Resp: 18  Temp: 98.1 F (36.7 C)  SpO2: 98%   Filed Weights   08/16/23 1023  Weight: 146 lb 3.2 oz (66.3 kg)     LABORATORY DATA:  I have reviewed the data as listed    Latest Ref Rng & Units 06/27/2023    7:09 AM 06/26/2023    6:09 PM 04/20/2023    2:45 PM  CMP  Glucose 70 - 99 mg/dL 235  573  220   BUN 6 - 20 mg/dL 13  15  11    Creatinine 0.44 - 1.00 mg/dL 2.54  2.70  6.23   Sodium 135 - 145 mmol/L  137  137  139   Potassium 3.5 - 5.1 mmol/L 3.4  3.7  4.0   Chloride 98 - 111 mmol/L 105  100  105   CO2 22 - 32 mmol/L 19  20  24    Calcium 8.9 - 10.3 mg/dL 9.3  01.0  27.2   Total Protein 6.5 - 8.1 g/dL 7.0  9.0  7.7   Total Bilirubin 0.3 - 1.2 mg/dL 0.5  0.5  0.3   Alkaline Phos 38 - 126 U/L 241  302  381   AST 15 - 41 U/L 16  22  26    ALT 0 - 44 U/L 9  14  14      Lab Results  Component Value Date   WBC 7.2 06/27/2023   HGB 10.1 (L) 06/27/2023   HCT 31.6 (L) 06/27/2023   MCV 85.4 06/27/2023   PLT 345 06/27/2023   NEUTROABS 8.6 (H) 06/26/2023    ASSESSMENT & PLAN:  Malignant neoplasm of upper-inner quadrant of left breast in female, estrogen receptor positive (HCC) 06/04/2020:Left lumpectomy ([name]): invasive and in situ ductal carcinoma, 3.2cm, clear margins, one left axillary lymph node negative for carcinoma.  Patient refused adjuvant chemo, adjuvant radiation and adjuvant antiestrogen therapy.   Patient went to  orthopedics for right hip pain.  Imaging revealed bone metastasis.  CT CAP: Cortical destruction of the right femoral neck consistent with skeletal metastases at risk for pathologic fracture.  Lucent lesion in the left iliac bone and L3 vertebral body consistent with multifocal skeletal metastases. 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%, HER2 3+ positive, Ki-67 35%    Treatment plan change: Oophorectomy: 09/30/2022 Current treatment: Anastrozole, Herceptin Hylecta Patient refused switching from Herceptin to Kadcyla (her insurance denied Herceptin Hylecta and patient does not want to receive it in IV form), stopped Herceptin (last given 09/17/2022) Neratinib started 01/05/2023 discontinued 02/08/2023 resumed Herceptin Hylecta 04/20/2023   Pain control: MS Contin and Dilaudid    Hospitalization 03/24/2023-03/27/2023 increased pain in the back: Vertebral mets and compression fractures: Radiation to lumbar spine completed 04/12/2023   Elevated alkaline phosphatase: Secondary to bone metastases and hypercalcemia.  She does not want to take Xgeva.    Current treatment: Herceptin Hylecta every 4 weeks CT CAP 05/27/2023: Increase sclerosis of bone metastases. CT angio 06/26/2023: No PE widespread bone metastases.  New  subacute pathological fracture Rt sixth and ninth ribs   06/09/2023: Right lumpectomy: Grade 2 IDC 1.9 cm with high-grade DCIS, margins negative, LVI present, ER 90%, PR 60%, HER2 3+ positive, Ki67 35%, RCB class II   Return to clinic every 3 weeks for Herceptin injections and every 6 weeks of follow-up with me. We decided to do scans towards the end of January.     Orders Placed This Encounter  Procedures   CT CHEST ABDOMEN PELVIS W CONTRAST    Standing Status:   Future    Standing Expiration  Date:   08/15/2024    Order Specific Question:   If indicated for the ordered procedure, I authorize the administration of contrast media per Radiology protocol    Answer:   Yes    Order Specific Question:   Does the patient have a contrast media/X-ray dye allergy?    Answer:   No    Order Specific Question:   Is patient pregnant?    Answer:   No    Order Specific Question:   Preferred imaging location?    Answer:   [facility]    Order Specific Question:   Release to patient    Answer:   Immediate    Order Specific Question:   If indicated for the ordered procedure, I authorize the administration of oral contrast media per Radiology protocol    Answer:   Yes   CBC with Differential (Cancer Center Only)    Standing Status:   Future    Standing Expiration Date:   08/15/2024   CMP (Cancer Center only)    Standing Status:   Future    Standing Expiration Date:   08/15/2024   CA 27.29    Standing Status:   Future    Standing Expiration Date:   08/15/2024   CA 15.3    Standing Status:   Future    Standing Expiration Date:   08/15/2024   ECHOCARDIOGRAM COMPLETE    Standing Status:   Future    Standing Expiration Date:   08/15/2024    Order Specific Question:   Where should this test be performed    Answer:   [facility]    Order Specific Question:   Perflutren DEFINITY (image enhancing agent) should be administered unless hypersensitivity or allergy exist    Answer:   Administer  Perflutren    Order Specific Question:   Reason for exam-Echo    Answer:   Chemo  Z09    Order Specific Question:   Release to patient    Answer:   Immediate   The patient has a good understanding of the overall plan. she agrees with it. she will call with any problems that may develop before the next visit here. Total time spent: 30 mins including face to face time and time spent for planning, charting and co-ordination of care   Tamsen Meek, MD 08/16/23

## 2023-08-17 ENCOUNTER — Other Ambulatory Visit: Payer: 59

## 2023-08-18 ENCOUNTER — Other Ambulatory Visit: Payer: Self-pay

## 2023-08-20 ENCOUNTER — Inpatient Hospital Stay: Payer: 59

## 2023-08-20 DIAGNOSIS — Z5112 Encounter for antineoplastic immunotherapy: Secondary | ICD-10-CM | POA: Diagnosis not present

## 2023-08-20 DIAGNOSIS — C7951 Secondary malignant neoplasm of bone: Secondary | ICD-10-CM

## 2023-08-20 DIAGNOSIS — Z170 Estrogen receptor positive status [ER+]: Secondary | ICD-10-CM

## 2023-08-20 LAB — CBC WITH DIFFERENTIAL (CANCER CENTER ONLY)
Abs Immature Granulocytes: 0.02 10*3/uL (ref 0.00–0.07)
Basophils Absolute: 0 10*3/uL (ref 0.0–0.1)
Basophils Relative: 1 %
Eosinophils Absolute: 0.4 10*3/uL (ref 0.0–0.5)
Eosinophils Relative: 6 %
HCT: 34.5 % — ABNORMAL LOW (ref 36.0–46.0)
Hemoglobin: 11 g/dL — ABNORMAL LOW (ref 12.0–15.0)
Immature Granulocytes: 0 %
Lymphocytes Relative: 16 %
Lymphs Abs: 1.1 10*3/uL (ref 0.7–4.0)
MCH: 27.9 pg (ref 26.0–34.0)
MCHC: 31.9 g/dL (ref 30.0–36.0)
MCV: 87.6 fL (ref 80.0–100.0)
Monocytes Absolute: 0.6 10*3/uL (ref 0.1–1.0)
Monocytes Relative: 8 %
Neutro Abs: 5.1 10*3/uL (ref 1.7–7.7)
Neutrophils Relative %: 69 %
Platelet Count: 333 10*3/uL (ref 150–400)
RBC: 3.94 MIL/uL (ref 3.87–5.11)
RDW: 14.6 % (ref 11.5–15.5)
WBC Count: 7.3 10*3/uL (ref 4.0–10.5)
nRBC: 0 % (ref 0.0–0.2)

## 2023-08-25 ENCOUNTER — Inpatient Hospital Stay: Payer: 59 | Admitting: Licensed Clinical Social Worker

## 2023-08-25 DIAGNOSIS — Z5112 Encounter for antineoplastic immunotherapy: Secondary | ICD-10-CM | POA: Insufficient documentation

## 2023-08-25 DIAGNOSIS — C7951 Secondary malignant neoplasm of bone: Secondary | ICD-10-CM | POA: Insufficient documentation

## 2023-08-25 DIAGNOSIS — Z170 Estrogen receptor positive status [ER+]: Secondary | ICD-10-CM | POA: Insufficient documentation

## 2023-08-25 DIAGNOSIS — C50212 Malignant neoplasm of upper-inner quadrant of left female breast: Secondary | ICD-10-CM | POA: Insufficient documentation

## 2023-08-25 DIAGNOSIS — C50411 Malignant neoplasm of upper-outer quadrant of right female breast: Secondary | ICD-10-CM | POA: Insufficient documentation

## 2023-08-25 NOTE — Progress Notes (Signed)
[facility] Clinical Social Work  CSW received TC from pt requesting a letter for an emotional support animal. She is looking to adopt a dog this week and her housing/rental agency is requesting the letter supporting this need for her.  CSW is unable to write the letter but medical oncologist agreed to assist.   Letter needs to include pt's name, request that she be granted permission to have an emotional support animal, and reason why (example: to help reduce stress/anxiety due to her illness/disability).  Request forwarded to Dr. Pamelia Hoit. Pt would like letter e-mailed to her if possible so she can provide it to her apartment complex.    Natalie Griffin E Shailen Thielen, LCSW

## 2023-08-26 ENCOUNTER — Encounter: Payer: Self-pay | Admitting: *Deleted

## 2023-08-30 ENCOUNTER — Telehealth: Payer: Self-pay

## 2023-08-30 NOTE — Telephone Encounter (Signed)
Pt called [employer] [place] to call [phone] to verify we did send a letter for emotional support animal. LVM for the [employer] rep verifying we did send a letter in.

## 2023-09-06 ENCOUNTER — Encounter (HOSPITAL_COMMUNITY): Payer: Self-pay | Admitting: Hematology and Oncology

## 2023-09-06 ENCOUNTER — Other Ambulatory Visit (HOSPITAL_COMMUNITY): Payer: Self-pay | Admitting: Hematology and Oncology

## 2023-09-06 ENCOUNTER — Other Ambulatory Visit: Payer: Self-pay | Admitting: *Deleted

## 2023-09-06 ENCOUNTER — Encounter: Payer: Self-pay | Admitting: Hematology and Oncology

## 2023-09-06 ENCOUNTER — Encounter: Payer: Self-pay | Admitting: *Deleted

## 2023-09-06 ENCOUNTER — Ambulatory Visit (HOSPITAL_COMMUNITY)
Admission: RE | Admit: 2023-09-06 | Discharge: 2023-09-06 | Disposition: A | Discharge status: Home / Self Care | Payer: 59 | Source: Ambulatory Visit | Attending: Hematology and Oncology | Admitting: Hematology and Oncology

## 2023-09-06 DIAGNOSIS — C7951 Secondary malignant neoplasm of bone: Secondary | ICD-10-CM | POA: Diagnosis present

## 2023-09-06 DIAGNOSIS — Z170 Estrogen receptor positive status [ER+]: Secondary | ICD-10-CM | POA: Insufficient documentation

## 2023-09-06 DIAGNOSIS — R52 Pain, unspecified: Secondary | ICD-10-CM

## 2023-09-06 DIAGNOSIS — C50212 Malignant neoplasm of upper-inner quadrant of left female breast: Secondary | ICD-10-CM | POA: Diagnosis present

## 2023-09-06 MED ORDER — GADOBUTROL 1 MMOL/ML IV SOLN
6.0000 mL | Freq: Once | INTRAVENOUS | Status: AC | PRN
  Administered 2023-09-06: 5 mL via INTRAVENOUS

## 2023-09-06 NOTE — Progress Notes (Signed)
Received mychart message from pt with complaint of severe back pain.  Verbal orders received from MD to obtain MRI thoracic spine to r/o fracture from metastatic disease. STAT orders placed.   MD also states pt to take MS Contin 60 mg three times a day instead of Q12 hrs and increase oxy IR 5 mg does to 10 mg Q4 hrs PRN.  Pt educated and verbalized understanding.

## 2023-09-06 NOTE — Progress Notes (Signed)
Per MD request, referral placed for [facility] palliative care for pain management.

## 2023-09-07 ENCOUNTER — Other Ambulatory Visit: Payer: Self-pay | Admitting: *Deleted

## 2023-09-07 ENCOUNTER — Other Ambulatory Visit: Payer: Self-pay

## 2023-09-07 ENCOUNTER — Other Ambulatory Visit: Payer: Self-pay | Admitting: Hematology and Oncology

## 2023-09-07 ENCOUNTER — Ambulatory Visit
Admission: RE | Admit: 2023-09-07 | Discharge: 2023-09-07 | Disposition: A | Discharge status: Home / Self Care | Payer: 59 | Source: Ambulatory Visit | Attending: Radiation Oncology | Admitting: Radiation Oncology

## 2023-09-07 ENCOUNTER — Encounter: Payer: Self-pay | Admitting: Radiation Oncology

## 2023-09-07 DIAGNOSIS — C7951 Secondary malignant neoplasm of bone: Secondary | ICD-10-CM

## 2023-09-07 NOTE — Progress Notes (Unsigned)
Palliative Medicine [facility]  Telephone:(336) [phone] Fax:(336) [phone]   Name: Natalie Griffin Date: 09/07/2023 MRN: [id number]  DOB: 1975-10-12  Patient Care Team: Serena Croissant, MD as PCP - General (Hematology and Oncology) Donnelly Angelica, RN as Oncology Nurse Navigator Pershing Proud, RN as Oncology Nurse Navigator Rachel Moulds, MD as Consulting Physician (Hematology and Oncology)    REASON FOR CONSULTATION: Natalie Griffin is a 47 y.o. 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  Palliative ask to see for symptom management and goals of care.    SOCIAL HISTORY:     reports that she quit smoking about 3 years ago. Her smoking use included cigarettes. She has never used smokeless tobacco. She reports that she does not currently use alcohol. She reports that she does not use drugs.  ADVANCE DIRECTIVES:  Advanced directives on file naming Natalie Griffin as patient healthcare power of attorney if patient becomes unable to speak for themselves.   CODE STATUS: DNR  PAST MEDICAL HISTORY: Past Medical History:  Diagnosis Date   Anemia    Anxiety    Bone metastasis    Breast cancer (HCC) 2021   Left breast invasive ductal carcinoma   Cancer (HCC) 2021   Left breast   Complication of anesthesia    pseudocholinesterase deficiency   Family history of adverse reaction to anesthesia    Father and Aunt have pseudocholinesterase deficiency - Trouble waking up from anesthesia   History of hiatal hernia    History of kidney stones    noted on CT Left nonobstructive   Pseudocholinesterase deficiency     PAST SURGICAL HISTORY:  Past Surgical History:  Procedure Laterality Date   BREAST BIOPSY Right 07/21/2022   [place] RT BREAST BX W LOC DEV 1ST LESION IMG BX SPEC US GUIDE 07/21/2022 [facility]   BREAST BIOPSY  06/08/2023   MM RT RADIOACTIVE SEED LOC  MAMMO GUIDE 06/08/2023 [facility] MAMMOGR[facility]   BREAST LUMPECTOMY WITH RADIOACTIVE SEED AND SENTINEL LYMPH NODE BIOPSY Left 06/04/2020   Procedure: LEFT BREAST LUMPECTOMY WITH RADIOACTIVE SEED AND SENTINEL LYMPH NODE MAPPING;  Surgeon: Harriette Bouillon, MD;  Location: [facility];  Service: General;  Laterality: Left;  PEC BLOCK, RNFA   BREAST LUMPECTOMY WITH RADIOACTIVE SEED LOCALIZATION Right 06/09/2023   Procedure: RIGHT BREAST LUMPECTOMY WITH RADIOACTIVE SEED LOCALIZATION;  Surgeon: Harriette Bouillon, MD;  Location: [facility];  Service: General;  Laterality: Right;   FEMUR IM NAIL Right 04/01/2021   Procedure: INTRAMEDULLARY (IM) NAIL FEMORAL;  Surgeon: Sheral Apley, MD;  Location: [facility];  Service: Orthopedics;  Laterality: Right;   ROBOTIC ASSISTED BILATERAL SALPINGO OOPHERECTOMY Bilateral 09/30/2022   Procedure: XI ROBOTIC ASSISTED BILATERAL SALPINGO OOPHORECTOMY;  Surgeon: Carver Fila, MD;  Location: [facility];  Service: Gynecology;  Laterality: Bilateral;   WISDOM TOOTH EXTRACTION      HEMATOLOGY/ONCOLOGY HISTORY:  Oncology History  Malignant neoplasm of upper-inner quadrant of left breast in female, estrogen receptor positive (HCC)  04/11/2020 Initial Diagnosis   Patient palpated a left breast lump. Mammogram on 03/08/20 showed a 3.0cm mass at the 11 o'clock position with no axillary adenopathy. 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   05/09/2020 Cancer Staging   Staging form: Breast, AJCC 8th Edition - Clinical stage from 05/09/2020: Stage IB (cT2, cN0, cM0, G2, ER+, PR+,  HER2+) - Signed by Serena Croissant, MD on 05/09/2020   06/04/2020 Surgery   Left lumpectomy ([name]): invasive and in situ ductal carcinoma, 3.2cm, clear margins, one left axillary lymph node negative for carcinoma.    02/2021 Relapse/Recurrence   Patient went to orthopedics for right hip pain.  Imaging revealed bone metastasis.   CT CAP: Cortical destruction of the right femoral neck consistent with skeletal metastases at risk for pathologic fracture.  Lucent lesion in the left iliac bone and L3 vertebral body consistent with multifocal skeletal metastases.   04/01/2021 Surgery   04/01/2021: Femoral intramedullary nail  Pathology review: Metastatic breast cancer ER 2% PR 0% HER2 3+ positive   04/08/2021 -  Anti-estrogen oral therapy   Tamoxifen 20 mg was prescribed on 04/08/2021 (discontinued by patient because she felt that the risks and benefits did not support it )--resumed in 07/2022 based on pathology results from right breast biopsy   05/14/2021 -  Chemotherapy   Subcutaneous Herceptin Perjeta (Phesgo) injection started 05/14/2021, switched to Herceptin Hylecta 06/11/2021 (Fixed drug eruption, profound diarrhea to Phesgo)     05/20/2021 - 06/03/2021 Radiation Therapy   Palliative radiation to the iliac bone and hip   02/04/2022 Imaging   MRI thoracic spine 02/04/2022: Numerous bone metastases thoracic spine cervical and lumbar spines largest T5, T8, T11.  Chronic compression fractures T4, T5, T10 and T12    05/07/2022 Imaging   CT chest abdomen pelvis: No new or progressive bone metastases.  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   07/29/2022 PET scan   IMPRESSION: 1. Hypermetabolic mass in the posterior deep RIGHT breast consistent primary breast carcinoma. 2. Central nodal mediastinal metastasis to the high LEFT prevascular space and LEFT super clavicular node. 3. 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  [name] recommended changing treatment to [place] and Aerith wanted to wait until after the holidays.  She will discuss further with Dr.  Pamelia Hoit in January.   11/19/2022 - 11/19/2022 Chemotherapy   Patient is on Treatment Plan : BREAST Trastuzumab IV (8/6) or SQ (600) D1 q21d     04/20/2023 -  Chemotherapy   Patient is on Treatment Plan : BREAST MAINTENANCE Trastuzumab IV (6) or SQ (600) D1 q21d x 13 cycles       ALLERGIES:  is allergic to adhesive [tape], fentanyl, phesgo [pertuz-trastuz-hyaluron-zzxf], tetanus toxoid, and succinylcholine.  MEDICATIONS:  Current Outpatient Medications  Medication Sig Dispense Refill   ALPRAZolam (XANAX) 0.5 MG tablet Take 1 tablet (0.5 mg total) by mouth 3 (three) times daily as needed for anxiety. 90 tablet 3   amphetamine-dextroamphetamine (ADDERALL) 10 MG tablet Take 1/2-1 tablet (5-10 mg total) by mouth 2 (two) times daily as needed. 60 tablet 0   anastrozole (ARIMIDEX) 1 MG tablet Take 1 tablet (1 mg total) by mouth daily. 90 tablet 3   morphine (MS CONTIN) 60 MG 12 hr tablet Take 1 tablet (60 mg total) by mouth every 12 (twelve) hours. 120 tablet 0   Multiple Vitamin (ONE DAILY MULTIVITAMIN ADULT PO) Take 1 tablet by mouth daily.     oxyCODONE (OXY IR/ROXICODONE) 5 MG immediate release tablet Take 1 tablet (5 mg total) by mouth every 4 (four) hours as needed for severe pain (pain score 7-10). 120  tablet 0   No current facility-administered medications for this visit.    VITAL SIGNS: There were no vitals taken for this visit. There were no vitals filed for this visit.  Estimated body mass index is 24.33 kg/m as calculated from the following:   Height as of 08/16/23: 5\' 5"  (1.651 m).   Weight as of 08/16/23: 146 lb 3.2 oz (66.3 kg).  LABS: CBC:    Component Value Date/Time   WBC 7.3 08/20/2023 1156   WBC 7.2 06/27/2023 0709   HGB 11.0 (L) 08/20/2023 1156   HCT 34.5 (L) 08/20/2023 1156   PLT 333 08/20/2023 1156   MCV 87.6 08/20/2023 1156   NEUTROABS 5.1 08/20/2023 1156   LYMPHSABS 1.1 08/20/2023 1156   MONOABS 0.6 08/20/2023 1156   EOSABS 0.4 08/20/2023 1156   BASOSABS  0.0 08/20/2023 1156   Comprehensive Metabolic Panel:    Component Value Date/Time   NA 137 06/27/2023 0709   K 3.4 (L) 06/27/2023 0709   CL 105 06/27/2023 0709   CO2 19 (L) 06/27/2023 0709   BUN 13 06/27/2023 0709   CREATININE 0.88 06/27/2023 0709   CREATININE 0.72 04/20/2023 1445   GLUCOSE 119 (H) 06/27/2023 0709   CALCIUM 9.3 06/27/2023 0709   AST 16 06/27/2023 0709   AST 26 04/20/2023 1445   ALT 9 06/27/2023 0709   ALT 14 04/20/2023 1445   ALKPHOS 241 (H) 06/27/2023 0709   BILITOT 0.5 06/27/2023 0709   BILITOT 0.3 04/20/2023 1445   PROT 7.0 06/27/2023 0709   ALBUMIN 3.3 (L) 06/27/2023 0709    RADIOGRAPHIC STUDIES: MR THORACIC SPINE W WO CONTRAST Result Date: 09/06/2023 CLINICAL DATA:  Metastatic disease evaluation severe back pain, hx of fracture r/t metastatic breast cancer EXAM: MRI THORACIC WITHOUT AND WITH CONTRAST TECHNIQUE: Multiplanar and multiecho pulse sequences of the thoracic spine were obtained without and with intravenous contrast. CONTRAST:  5mL GADAVIST GADOBUTROL 1 MMOL/ML IV SOLN COMPARISON:  Thoracic spine MRI 03/25/2023 FINDINGS: Alignment:  Physiologic. Vertebrae: Redemonstrated is widespread osseous metastatic disease involving multiple thoracic vertebral bodies of the T4-L1 vertebral body levels. There is progression of the compression deformity at the T10 level now with greater involvement of the superior endplate. Compared to prior exam there is a new severe compression deformity of the T8 vertebral body level (series 23, image 10). There is epidural extension tumor of the T8-L1 levels, most notably at the T8-T9 disc space level There is also interval increase in size of the contrast-enhancing lesions involving the posterior elements of the T9, T11, and T12 levels. There is evidence of epidural spread of tumor in the dorsal epidural space of the T12-L1 disc space level (series 23, image 11). Cord:  Normal signal and morphology. Paraspinal and other soft tissues:  Negative. Disc levels: There is moderate to severe spinal canal narrowing at the T8 level (series 20, image 25). There is severe right-sided neural foraminal stenosis at T9-T10, tT10-T11 and T8-T9. There is moderate left-sided neural foraminal narrowing at T8-T9. IMPRESSION: 1. Widespread osseous metastatic disease involving multiple thoracic vertebral bodies and posterior elements, as above. Compared to prior exam there is a new severe compression deformity at the T8 vertebral body level. There is progressive epidural spread of tumor at the T8-L1 levels, most notably at the T8-T9 disc space level where there is now moderate to severe spinal canal stenosis. 2. Severe right-sided neural foraminal stenosis at T9-T10, T10-T11 and T8-T9, new from prior. 3. No cord signal abnormality. These results will be called  to the ordering clinician or representative by the Radiologist Assistant, and communication documented in the PACS or [employer]. Electronically Signed   By: Lorenza Cambridge M.D.   On: 09/06/2023 19:25    PERFORMANCE STATUS (ECOG) : {CHL ONC ECOG GN:[id number]}  Review of Systems Unless otherwise noted, a complete review of systems is negative.  Physical Exam General: NAD Cardiovascular: regular rate and rhythm Pulmonary: clear ant fields Abdomen: soft, nontender, + bowel sounds Extremities: no edema, no joint deformities Skin: no rashes Neurological: Alert and oriented x3  IMPRESSION: *** I introduced myself, Maygan RN, and Palliative's role in collaboration with the oncology team. Concept of Palliative Care was introduced as specialized medical care for people and their families living with serious illness.  It focuses on providing relief from the symptoms and stress of a serious illness.  The goal is to improve quality of life for both the patient and the family. Values and goals of care important to patient and family were attempted to be elicited.    We discussed *** current  illness and what it means in the larger context of *** on-going co-morbidities. Natural disease trajectory and expectations were discussed.  I discussed the importance of continued conversation with family and their medical providers regarding overall plan of care and treatment options, ensuring decisions are within the context of the patients values and GOCs.  PLAN: Established therapeutic relationship. Education provided on palliative's role in collaboration with their Oncology/Radiation team. I will plan to see patient back in 2-4 weeks in collaboration to other oncology appointments.    Patient expressed understanding and was in agreement with this plan. She also understands that She can call the clinic at any time with any questions, concerns, or complaints.   Thank you for your referral and allowing Palliative to assist in Natalie Griffin's care.   Number and complexity of problems addressed: ***HIGH - 1 or more chronic illnesses with SEVERE exacerbation, progression, or side effects of treatment - advanced cancer, pain. Any controlled substances utilized were prescribed in the context of palliative care.   Visit consisted of counseling and education dealing with the complex and emotionally intense issues of symptom management and palliative care in the setting of serious and potentially life-threatening illness.  Signed by: Willette Alma, AGPCNP-BC Palliative Medicine Team/[facility]   *Please note that this is a verbal dictation therefore any spelling or grammatical errors are due to the "[employer]" system interpretation.

## 2023-09-07 NOTE — Progress Notes (Signed)
Radiation Oncology         (336) [phone] ________________________________  Name: Natalie Griffin MRN: [id number]  Date: 09/07/2023  DOB: 08-07-1976  Telephone Follow-Up Visit Note  CC: Serena Croissant, MD  Charlane Ferretti, DO  Diagnosis:   47 yo woman with pain from thoracic spine (T8, T10, T12) bone metastases from cancer of the left upper inner breast - Stage IV    ICD-10-CM   1. Metastatic cancer to spine [facility])  C79.51       Interval Since Last Radiation:  5 months July 2024 L-spine 30 Gy in 10 fractions  08/04/22 - 08/12/22: Sternum and Thoracic spine, upper / 21 Gy in 7 fractions  05/19/21 - 06/03/21: Femur, right / 30 Gy in 10 fractions   Narrative:  The patient returns today for re-eval for back pain.  Dr. Pamelia Hoit called me to report on the patient's recently worsening back pain and thoracic MRI which shows progressive metastases in the T-spine.  I reviewed the films and called the patient.  She denies leg weakness and numbness.                                 ALLERGIES:  is allergic to adhesive [tape], fentanyl, phesgo [pertuz-trastuz-hyaluron-zzxf], tetanus toxoid, and succinylcholine.  Meds: Current Outpatient Medications  Medication Sig Dispense Refill   ALPRAZolam (XANAX) 0.5 MG tablet Take 1 tablet (0.5 mg total) by mouth 3 (three) times daily as needed for anxiety. 90 tablet 3   amphetamine-dextroamphetamine (ADDERALL) 10 MG tablet Take 1/2-1 tablet (5-10 mg total) by mouth 2 (two) times daily as needed. 60 tablet 0   anastrozole (ARIMIDEX) 1 MG tablet Take 1 tablet (1 mg total) by mouth daily. 90 tablet 3   morphine (MS CONTIN) 60 MG 12 hr tablet Take 1 tablet (60 mg total) by mouth every 12 (twelve) hours. 120 tablet 0   Multiple Vitamin (ONE DAILY MULTIVITAMIN ADULT PO) Take 1 tablet by mouth daily.     oxyCODONE (OXY IR/ROXICODONE) 5 MG immediate release tablet Take 1 tablet (5 mg total) by mouth every 4 (four) hours as needed for severe pain (pain score 7-10).  120 tablet 0   No current facility-administered medications for this encounter.    Physical Findings: The patient is in no acute distress. Patient is alert and oriented. .  No significant changes noted over the phone.  Lab Findings: Lab Results  Component Value Date   WBC 7.3 08/20/2023   WBC 7.2 06/27/2023   HGB 11.0 (L) 08/20/2023   HCT 34.5 (L) 08/20/2023   PLT 333 08/20/2023    Lab Results  Component Value Date   NA 137 06/27/2023   K 3.4 (L) 06/27/2023   CO2 19 (L) 06/27/2023   GLUCOSE 119 (H) 06/27/2023   BUN 13 06/27/2023   CREATININE 0.88 06/27/2023   CREATININE 0.72 04/20/2023   BILITOT 0.5 06/27/2023   BILITOT 0.3 04/20/2023   ALKPHOS 241 (H) 06/27/2023   AST 16 06/27/2023   AST 26 04/20/2023   ALT 9 06/27/2023   ALT 14 04/20/2023   PROT 7.0 06/27/2023   ALBUMIN 3.3 (L) 06/27/2023   CALCIUM 9.3 06/27/2023   ANIONGAP 13 06/27/2023    Radiographic Findings: MR THORACIC SPINE W WO CONTRAST Result Date: 09/06/2023 CLINICAL DATA:  Metastatic disease evaluation severe back pain, hx of fracture r/t metastatic breast cancer EXAM: MRI THORACIC WITHOUT AND WITH CONTRAST TECHNIQUE: Multiplanar and multiecho  pulse sequences of the thoracic spine were obtained without and with intravenous contrast. CONTRAST:  5mL GADAVIST GADOBUTROL 1 MMOL/ML IV SOLN COMPARISON:  Thoracic spine MRI 03/25/2023 FINDINGS: Alignment:  Physiologic. Vertebrae: Redemonstrated is widespread osseous metastatic disease involving multiple thoracic vertebral bodies of the T4-L1 vertebral body levels. There is progression of the compression deformity at the T10 level now with greater involvement of the superior endplate. Compared to prior exam there is a new severe compression deformity of the T8 vertebral body level (series 23, image 10). There is epidural extension tumor of the T8-L1 levels, most notably at the T8-T9 disc space level There is also interval increase in size of the contrast-enhancing  lesions involving the posterior elements of the T9, T11, and T12 levels. There is evidence of epidural spread of tumor in the dorsal epidural space of the T12-L1 disc space level (series 23, image 11). Cord:  Normal signal and morphology. Paraspinal and other soft tissues: Negative. Disc levels: There is moderate to severe spinal canal narrowing at the T8 level (series 20, image 25). There is severe right-sided neural foraminal stenosis at T9-T10, tT10-T11 and T8-T9. There is moderate left-sided neural foraminal narrowing at T8-T9. IMPRESSION: 1. Widespread osseous metastatic disease involving multiple thoracic vertebral bodies and posterior elements, as above. Compared to prior exam there is a new severe compression deformity at the T8 vertebral body level. There is progressive epidural spread of tumor at the T8-L1 levels, most notably at the T8-T9 disc space level where there is now moderate to severe spinal canal stenosis. 2. Severe right-sided neural foraminal stenosis at T9-T10, T10-T11 and T8-T9, new from prior. 3. No cord signal abnormality. These results will be called to the ordering clinician or representative by the Radiologist Assistant, and communication documented in the PACS or [employer]. Electronically Signed   By: Lorenza Cambridge M.D.   On: 09/06/2023 19:25    Impression:  The patient has progressive painful bone metastases with epidural extension.  She may benefit from localized palliative radiotherapy.  Plan:  Today, I talked to the patient and family about the findings and work-up recently.  We discussed the natural history of spinal metastases and general treatment, highlighting the role of radiotherapy in the management.  We discussed the available radiation techniques, and focused on the details of logistics and delivery.  We reviewed the anticipated acute and late sequelae associated with radiation in this setting.  The patient was encouraged to ask questions that I answered to the  best of my ability.    The patient would like to proceed with radiation and will be scheduled for CT simulation tomorrow with anticipated start date of 12/23  I personally spent 20 minutes in this encounter including chart review, reviewing radiological studies, meeting face-to-face with the patient, entering orders and completing documentation.   _____________________________________  Artist Pais Kathrynn Running, M.D.

## 2023-09-07 NOTE — Progress Notes (Signed)
Per MD request, referral placed to Dr. Kathrynn Running with radiation oncology for therapy to pt spinal area to treat fractures.  Referral placed.

## 2023-09-07 NOTE — Progress Notes (Signed)
  Radiation Oncology         (336) [phone] ________________________________  Name: Natalie Griffin MRN: [id number]  Date: 09/08/2023  DOB: 06/16/1976  SIMULATION AND TREATMENT PLANNING NOTE    ICD-10-CM   1. Metastatic cancer to spine [facility])  C79.51       DIAGNOSIS:  47 y.o. patient with thoracic spinal metastasis  NARRATIVE:  The patient was brought to the CT Simulation planning suite.  Identity was confirmed.  All relevant records and images related to the planned course of therapy were reviewed.  The patient freely provided informed written consent to proceed with treatment after reviewing the details related to the planned course of therapy. The consent form was witnessed and verified by the simulation staff.  Then, the patient was set-up in a stable reproducible  supine position for radiation therapy.  CT images were obtained.  Surface markings were placed.  The CT images were loaded into the planning software.  Then the target and avoidance structures were contoured including kidneys.  Treatment planning then occurred.  The radiation prescription was entered and confirmed.  Then, I designed and supervised the construction of a total of 3 medically necessary complex treatment devices with VacLoc positioner and 2 MLCs to shield kidneys.  I have requested : 3D Simulation  I have requested a DVH of the following structures: Left Kidney, Right Kidney and target.  PLAN:  The patient will receive 28 Gy in 8 fractions T7-L1 to start 12/23 with 4-fractions per week for two weeks..  ________________________________  Artist Pais Kathrynn Running, M.D.

## 2023-09-08 ENCOUNTER — Ambulatory Visit
Admission: RE | Admit: 2023-09-08 | Discharge: 2023-09-08 | Disposition: A | Discharge status: Home / Self Care | Payer: 59 | Source: Ambulatory Visit | Attending: Radiation Oncology | Admitting: Radiation Oncology

## 2023-09-08 ENCOUNTER — Ambulatory Visit (HOSPITAL_COMMUNITY): Payer: 59

## 2023-09-08 ENCOUNTER — Other Ambulatory Visit (HOSPITAL_COMMUNITY): Payer: Self-pay

## 2023-09-08 ENCOUNTER — Other Ambulatory Visit: Payer: Self-pay

## 2023-09-08 ENCOUNTER — Other Ambulatory Visit: Payer: Self-pay | Admitting: Hematology and Oncology

## 2023-09-08 ENCOUNTER — Encounter (HOSPITAL_COMMUNITY): Payer: Self-pay | Admitting: Pharmacist

## 2023-09-08 DIAGNOSIS — C781 Secondary malignant neoplasm of mediastinum: Secondary | ICD-10-CM | POA: Insufficient documentation

## 2023-09-08 DIAGNOSIS — C50411 Malignant neoplasm of upper-outer quadrant of right female breast: Secondary | ICD-10-CM | POA: Insufficient documentation

## 2023-09-08 DIAGNOSIS — C7951 Secondary malignant neoplasm of bone: Secondary | ICD-10-CM | POA: Insufficient documentation

## 2023-09-08 DIAGNOSIS — Z515 Encounter for palliative care: Secondary | ICD-10-CM | POA: Diagnosis not present

## 2023-09-08 DIAGNOSIS — R197 Diarrhea, unspecified: Secondary | ICD-10-CM | POA: Diagnosis not present

## 2023-09-08 DIAGNOSIS — Z5112 Encounter for antineoplastic immunotherapy: Secondary | ICD-10-CM | POA: Insufficient documentation

## 2023-09-08 DIAGNOSIS — Z510 Encounter for antineoplastic radiation therapy: Secondary | ICD-10-CM | POA: Diagnosis present

## 2023-09-08 MED ORDER — OXYCODONE HCL 10 MG PO TABS
10.0000 mg | ORAL_TABLET | ORAL | 0 refills | Status: DC | PRN
  Filled 2023-09-08 (×2): qty 120, 20d supply, fill #0

## 2023-09-08 MED ORDER — MORPHINE SULFATE ER 60 MG PO TBCR
60.0000 mg | EXTENDED_RELEASE_TABLET | Freq: Three times a day (TID) | ORAL | 0 refills | Status: DC
  Filled 2023-09-08: qty 90, 30d supply, fill #0

## 2023-09-09 ENCOUNTER — Ambulatory Visit: Payer: 59 | Admitting: Hematology and Oncology

## 2023-09-09 ENCOUNTER — Ambulatory Visit: Payer: 59

## 2023-09-09 DIAGNOSIS — Z5112 Encounter for antineoplastic immunotherapy: Secondary | ICD-10-CM | POA: Diagnosis not present

## 2023-09-10 ENCOUNTER — Encounter: Payer: Self-pay | Admitting: Nurse Practitioner

## 2023-09-10 ENCOUNTER — Other Ambulatory Visit (HOSPITAL_COMMUNITY): Payer: Self-pay

## 2023-09-10 ENCOUNTER — Ambulatory Visit (HOSPITAL_COMMUNITY)
Admission: RE | Admit: 2023-09-10 | Discharge: 2023-09-10 | Disposition: A | Discharge status: Home / Self Care | Payer: 59 | Source: Ambulatory Visit | Attending: Hematology and Oncology | Admitting: Hematology and Oncology

## 2023-09-10 ENCOUNTER — Encounter: Payer: Self-pay | Admitting: Hematology and Oncology

## 2023-09-10 ENCOUNTER — Inpatient Hospital Stay (HOSPITAL_BASED_OUTPATIENT_CLINIC_OR_DEPARTMENT_OTHER): Payer: 59 | Admitting: Nurse Practitioner

## 2023-09-10 VITALS — BP 115/80 | HR 78 | Temp 98.3°F | Resp 18 | Wt 147.2 lb

## 2023-09-10 DIAGNOSIS — Z515 Encounter for palliative care: Secondary | ICD-10-CM | POA: Diagnosis not present

## 2023-09-10 DIAGNOSIS — K5903 Drug induced constipation: Secondary | ICD-10-CM

## 2023-09-10 DIAGNOSIS — C50212 Malignant neoplasm of upper-inner quadrant of left female breast: Secondary | ICD-10-CM | POA: Diagnosis present

## 2023-09-10 DIAGNOSIS — G893 Neoplasm related pain (acute) (chronic): Secondary | ICD-10-CM | POA: Diagnosis not present

## 2023-09-10 DIAGNOSIS — C7951 Secondary malignant neoplasm of bone: Secondary | ICD-10-CM | POA: Diagnosis not present

## 2023-09-10 DIAGNOSIS — Z7969 Long term (current) use of other immunomodulators and immunosuppressants: Secondary | ICD-10-CM | POA: Insufficient documentation

## 2023-09-10 DIAGNOSIS — Z0189 Encounter for other specified special examinations: Secondary | ICD-10-CM

## 2023-09-10 DIAGNOSIS — M62838 Other muscle spasm: Secondary | ICD-10-CM

## 2023-09-10 DIAGNOSIS — Z170 Estrogen receptor positive status [ER+]: Secondary | ICD-10-CM | POA: Diagnosis present

## 2023-09-10 LAB — ECHOCARDIOGRAM COMPLETE
Area-P 1/2: 4.08 cm2
Calc EF: 57 %
S' Lateral: 3.2 cm
Single Plane A2C EF: 55.6 %
Single Plane A4C EF: 58.7 %

## 2023-09-10 MED ORDER — METHOCARBAMOL 500 MG PO TABS
500.0000 mg | ORAL_TABLET | Freq: Three times a day (TID) | ORAL | 1 refills | Status: DC
  Filled 2023-09-10: qty 90, 30d supply, fill #0

## 2023-09-13 ENCOUNTER — Inpatient Hospital Stay: Payer: 59

## 2023-09-13 ENCOUNTER — Other Ambulatory Visit: Payer: Self-pay

## 2023-09-13 ENCOUNTER — Ambulatory Visit: Payer: 59

## 2023-09-13 ENCOUNTER — Encounter: Payer: Self-pay | Admitting: Hematology and Oncology

## 2023-09-13 ENCOUNTER — Inpatient Hospital Stay (HOSPITAL_BASED_OUTPATIENT_CLINIC_OR_DEPARTMENT_OTHER): Payer: 59 | Admitting: Hematology and Oncology

## 2023-09-13 ENCOUNTER — Ambulatory Visit
Admission: RE | Admit: 2023-09-13 | Discharge: 2023-09-13 | Disposition: A | Discharge status: Home / Self Care | Payer: 59 | Source: Ambulatory Visit | Attending: Radiation Oncology | Admitting: Radiation Oncology

## 2023-09-13 VITALS — BP 130/79 | HR 100 | Temp 98.1°F | Resp 18 | Ht 65.0 in | Wt 140.8 lb

## 2023-09-13 DIAGNOSIS — C50212 Malignant neoplasm of upper-inner quadrant of left female breast: Secondary | ICD-10-CM | POA: Diagnosis not present

## 2023-09-13 DIAGNOSIS — Z170 Estrogen receptor positive status [ER+]: Secondary | ICD-10-CM

## 2023-09-13 DIAGNOSIS — Z5112 Encounter for antineoplastic immunotherapy: Secondary | ICD-10-CM | POA: Diagnosis not present

## 2023-09-13 LAB — RAD ONC ARIA SESSION SUMMARY
Course Elapsed Days: 0
Plan Fractions Treated to Date: 1
Plan Prescribed Dose Per Fraction: 3.5 Gy
Plan Total Fractions Prescribed: 8
Plan Total Prescribed Dose: 28 Gy
Reference Point Dosage Given to Date: 3.5 Gy
Reference Point Session Dosage Given: 3.5 Gy
Session Number: 1

## 2023-09-13 LAB — CBC WITH DIFFERENTIAL (CANCER CENTER ONLY)
Abs Immature Granulocytes: 0.03 10*3/uL (ref 0.00–0.07)
Basophils Absolute: 0 10*3/uL (ref 0.0–0.1)
Basophils Relative: 1 %
Eosinophils Absolute: 0.2 10*3/uL (ref 0.0–0.5)
Eosinophils Relative: 4 %
HCT: 34.2 % — ABNORMAL LOW (ref 36.0–46.0)
Hemoglobin: 10.7 g/dL — ABNORMAL LOW (ref 12.0–15.0)
Immature Granulocytes: 1 %
Lymphocytes Relative: 20 %
Lymphs Abs: 1.2 10*3/uL (ref 0.7–4.0)
MCH: 27 pg (ref 26.0–34.0)
MCHC: 31.3 g/dL (ref 30.0–36.0)
MCV: 86.4 fL (ref 80.0–100.0)
Monocytes Absolute: 0.5 10*3/uL (ref 0.1–1.0)
Monocytes Relative: 8 %
Neutro Abs: 4.1 10*3/uL (ref 1.7–7.7)
Neutrophils Relative %: 66 %
Platelet Count: 330 10*3/uL (ref 150–400)
RBC: 3.96 MIL/uL (ref 3.87–5.11)
RDW: 15.1 % (ref 11.5–15.5)
WBC Count: 6.1 10*3/uL (ref 4.0–10.5)
nRBC: 0 % (ref 0.0–0.2)

## 2023-09-13 LAB — CMP (CANCER CENTER ONLY)
ALT: 8 U/L (ref 0–44)
AST: 20 U/L (ref 15–41)
Albumin: 4.4 g/dL (ref 3.5–5.0)
Alkaline Phosphatase: 249 U/L — ABNORMAL HIGH (ref 38–126)
Anion gap: 9 (ref 5–15)
BUN: 16 mg/dL (ref 6–20)
CO2: 25 mmol/L (ref 22–32)
Calcium: 10.2 mg/dL (ref 8.9–10.3)
Chloride: 106 mmol/L (ref 98–111)
Creatinine: 0.82 mg/dL (ref 0.44–1.00)
GFR, Estimated: >60 mL/min (ref >60)
Glucose, Bld: 98 mg/dL (ref 70–99)
Potassium: 4 mmol/L (ref 3.5–5.1)
Sodium: 140 mmol/L (ref 135–145)
Total Bilirubin: 0.3 mg/dL (ref <1.2)
Total Protein: 7.4 g/dL (ref 6.5–8.1)

## 2023-09-13 MED ORDER — TRASTUZUMAB-HYALURONIDASE-OYSK 600-10000 MG-UNT/5ML ~~LOC~~ SOLN
600.0000 mg | Freq: Once | SUBCUTANEOUS | Status: AC
  Administered 2023-09-13: 600 mg via SUBCUTANEOUS
  Filled 2023-09-13: qty 5

## 2023-09-13 NOTE — Progress Notes (Signed)
Patient Care Team: Serena Croissant, MD as PCP - General (Hematology and Oncology) Donnelly Angelica, RN as Oncology Nurse Navigator Pershing Proud, RN as Oncology Nurse Navigator Rachel Moulds, MD as Consulting Physician (Hematology and Oncology)  DIAGNOSIS:  Encounter Diagnosis  Name Primary?   Malignant neoplasm of upper-inner quadrant of left breast in female, estrogen receptor positive (HCC) Yes    SUMMARY OF ONCOLOGIC HISTORY: Oncology History  Malignant neoplasm of upper-inner quadrant of left breast in female, estrogen receptor positive (HCC)  04/11/2020 Initial Diagnosis   Patient palpated a left breast lump. Mammogram on 03/08/20 showed a 3.0cm mass at the 11 o'clock position with no axillary adenopathy. 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    02/2021 Relapse/Recurrence   Patient went to orthopedics for right hip pain.  Imaging revealed bone metastasis.  CT CAP: Cortical destruction of the right femoral neck consistent with skeletal metastases at risk for pathologic fracture.  Lucent lesion in the left iliac bone and L3 vertebral body consistent with multifocal skeletal metastases.   04/01/2021 Surgery   04/01/2021: Femoral intramedullary nail  Pathology review: Metastatic breast cancer ER 2% PR 0% HER2 3+ positive   04/08/2021 -  Anti-estrogen oral therapy   Tamoxifen 20 mg was prescribed on 04/08/2021 (discontinued by patient because she felt that the risks and benefits did not support it )--resumed in 07/2022 based on pathology results from right  breast biopsy   05/14/2021 -  Chemotherapy   Subcutaneous Herceptin Perjeta (Phesgo) injection started 05/14/2021, switched to Herceptin Hylecta 06/11/2021 (Fixed drug eruption, profound diarrhea to Phesgo)     05/20/2021 - 06/03/2021 Radiation Therapy   Palliative radiation to the iliac bone and hip   02/04/2022 Imaging   MRI thoracic spine 02/04/2022: Numerous bone metastases thoracic spine cervical and lumbar spines largest T5, T8, T11.  Chronic compression fractures T4, T5, T10 and T12    05/07/2022 Imaging   CT chest abdomen pelvis: No new or progressive bone metastases.  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   07/29/2022 PET scan   IMPRESSION: 1. Hypermetabolic mass in the posterior deep RIGHT breast consistent primary breast carcinoma. 2. Central nodal mediastinal metastasis to the high LEFT prevascular space and LEFT super clavicular node. 3. 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  [name] recommended changing treatment to Kadcyla and Candy wanted to wait until after the holidays.  She will discuss further with Dr. Pamelia Hoit in January.   11/19/2022 - 11/19/2022 Chemotherapy   Patient is on Treatment Plan : BREAST Trastuzumab IV (8/6) or SQ (600) D1 q21d     04/20/2023 -  Chemotherapy   Patient is on Treatment Plan : BREAST MAINTENANCE Trastuzumab IV (6) or SQ (600) D1 q21d x 13 cycles       CHIEF COMPLIANT: Herceptin Hylecta, intractable pain,  diarrhea  HISTORY OF PRESENT ILLNESS:   History of Present Illness   The patient, with a history of met breast cancer and chronic pain, reports an improvement in her pain levels following an increase in her medication. She expresses gratitude for the emotional  support provided by her dog. She also reports a recent bout of diarrhea, which she attributes to a meal at a restaurant. The patient is concerned about the diarrhea's potential impact on her upcoming treatment with Herceptin Hylecta. She also expresses concerns about potential jaw pain from a previous treatment and asks about the possibility of restarting it. The patient is proactive in managing her health, discussing plans to step down her pain medication with the help of palliative care and keeping up with her vitamin intake. She also expresses concern about potential lapses in her treatment due to changes in her insurance.         ALLERGIES:  is allergic to adhesive [tape], fentanyl, phesgo [pertuz-trastuz-hyaluron-zzxf], tetanus toxoid, and succinylcholine.  MEDICATIONS:  Current Outpatient Medications  Medication Sig Dispense Refill   ALPRAZolam (XANAX) 0.5 MG tablet Take 1 tablet (0.5 mg total) by mouth 3 (three) times daily as needed for anxiety. 90 tablet 3   anastrozole (ARIMIDEX) 1 MG tablet Take 1 tablet (1 mg total) by mouth daily. 90 tablet 3   methocarbamol (ROBAXIN) 500 MG tablet Take 1 tablet (500 mg total) by mouth 3 (three) times daily. 90 tablet 1   morphine (MS CONTIN) 60 MG 12 hr tablet Take 1 tablet (60 mg total) by mouth 3 (three) times daily. 90 tablet 0   Multiple Vitamin (ONE DAILY MULTIVITAMIN ADULT PO) Take 1 tablet by mouth daily.     Oxycodone HCl 10 MG TABS Take 1 tablet (10 mg total) by mouth every 4 (four) hours as needed for severe pain (pain score 7-10). 120 tablet 0   No current facility-administered medications for this visit.    PHYSICAL EXAMINATION: ECOG PERFORMANCE STATUS: 1 - Symptomatic but completely ambulatory  Vitals:   09/13/23 1114  BP: 130/79  Pulse: 100  Resp: 18  Temp: 98.1 F (36.7 C)  SpO2: 99%   Filed Weights   09/13/23 1114  Weight: 140 lb 12.8 oz (63.9 kg)     LABORATORY DATA:  I have reviewed the data as listed    Latest  Ref Rng & Units 06/27/2023    7:09 AM 06/26/2023    6:09 PM 04/20/2023    2:45 PM  CMP  Glucose 70 - 99 mg/dL 829  562  130   BUN 6 - 20 mg/dL 13  15  11    Creatinine 0.44 - 1.00 mg/dL 8.65  7.84  6.96   Sodium 135 - 145 mmol/L 137  137  139   Potassium 3.5 - 5.1 mmol/L 3.4  3.7  4.0   Chloride 98 - 111 mmol/L 105  100  105   CO2 22 - 32 mmol/L 19  20  24    Calcium 8.9 - 10.3 mg/dL 9.3  29.5  28.4   Total Protein 6.5 - 8.1 g/dL 7.0  9.0  7.7   Total Bilirubin 0.3 - 1.2 mg/dL 0.5  0.5  0.3   Alkaline Phos 38 - 126 U/L 241  302  381   AST 15 - 41 U/L 16  22  26    ALT 0 - 44 U/L 9  14  14      Lab Results  Component Value Date   WBC 7.3 08/20/2023  HGB 11.0 (L) 08/20/2023   HCT 34.5 (L) 08/20/2023   MCV 87.6 08/20/2023   PLT 333 08/20/2023   NEUTROABS 5.1 08/20/2023    ASSESSMENT & PLAN:  Malignant neoplasm of upper-inner quadrant of left breast in female, estrogen receptor positive (HCC) 06/04/2020:Left lumpectomy ([name]): invasive and in situ ductal carcinoma, 3.2cm, clear margins, one left axillary lymph node negative for carcinoma.  Patient refused adjuvant chemo, adjuvant radiation and adjuvant antiestrogen therapy.   Patient went to orthopedics for right hip pain.  Imaging revealed bone metastasis.  CT CAP: Cortical destruction of the right femoral neck consistent with skeletal metastases at risk for pathologic fracture.  Lucent lesion in the left iliac bone and L3 vertebral body consistent with multifocal skeletal metastases. 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%, HER2 3+ positive, Ki-67 35%    Treatment plan change: Oophorectomy: 09/30/2022 Current treatment: Anastrozole, Herceptin Hylecta Patient refused switching from Herceptin to Kadcyla (her insurance denied Herceptin Hylecta and patient does not want to receive it in IV form), stopped Herceptin (last given 09/17/2022) Neratinib started 01/05/2023 discontinued 02/08/2023 resumed Herceptin Hylecta 04/20/2023   Pain control: MS Contin and MS IR   Hospitalization 03/24/2023-03/27/2023 increased pain in the back: Vertebral mets and compression fractures: Radiation to lumbar spine completed 04/12/2023   Elevated alkaline phosphatase: Secondary to bone metastases and hypercalcemia.  She does not want to take Xgeva.    Current treatment: Herceptin Hylecta every 4 weeks CT CAP 05/27/2023: Increase sclerosis of bone metastases. CT angio 06/26/2023: No PE widespread bone metastases.  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 She has a new insurance starting next year and we will have to figure out how to get Herceptin injections continued. We decided to do scans towards the end of January. ------------------------------------- Assessment and Plan    Breast Cancer Undergoing radiation therapy. Pain is improving with increased medication. Discussed the importance of consistent dosing and the plan to taper down with the help of palliative care once  radiation therapy shows results. -Continue current pain management regimen. -Plan to taper down pain medication with the help of palliative care once radiation therapy shows results.  Gastrointestinal Disturbance Reports of diarrhea since eating at a restaurant. No fever or other signs of viral infection. -Continue monitoring symptoms.  Bone Health Discussed restarting Zometa (zoledronic acid) to improve bone health in the context of breast cancer. Last dose was in April 2023. -Order Zometa every three months.  Tumor Marker Testing Discussed restarting CA27.49 marker testing to monitor breast cancer. -Order CA27.49 marker testing with each blood work.  General Health Maintenance -Order comprehensive lab work today.  Orders Placed This Encounter  Procedures   CBC with Differential (Cancer Center Only)    Standing Status:   Future    Expiration Date:   09/12/2024   CMP (Cancer Center only)    Standing Status:   Future    Expiration Date:   09/12/2024   CA 27.29    Standing Status:   Standing    Number of Occurrences:   20    Expiration Date:   09/12/2024   The patient has a good understanding of the overall plan. she agrees with it. she will call with any problems that may develop before the next visit here. Total time spent: 30 mins including face to face time and time spent for planning, charting and co-ordination of care   Tamsen Meek, MD 09/13/23

## 2023-09-13 NOTE — Assessment & Plan Note (Signed)
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  Patient refused adjuvant chemo, adjuvant radiation and adjuvant antiestrogen therapy.   Patient went to orthopedics for right hip pain.  Imaging revealed bone metastasis.  CT CAP: Cortical destruction of the right femoral neck consistent with skeletal metastases at risk for pathologic fracture.  Lucent lesion in the left iliac bone and L3 vertebral body consistent with multifocal skeletal metastases. 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%, HER2 3+ positive, Ki-67 35%    Treatment plan change: Oophorectomy: 09/30/2022 Current treatment: Anastrozole, Herceptin Hylecta Patient refused switching from Herceptin to Kadcyla (her insurance denied Herceptin Hylecta and patient does not want to receive it in IV form), stopped Herceptin (last given 09/17/2022) Neratinib started 01/05/2023 discontinued 02/08/2023 resumed Herceptin Hylecta 04/20/2023   Pain control: MS Contin and Dilaudid    Hospitalization 03/24/2023-03/27/2023 increased pain in the back: Vertebral mets and compression fractures: Radiation to lumbar spine completed 04/12/2023   Elevated alkaline  phosphatase: Secondary to bone metastases and hypercalcemia.  She does not want to take Xgeva.    Current treatment: Herceptin Hylecta every 4 weeks CT CAP 05/27/2023: Increase sclerosis of bone metastases. CT angio 06/26/2023: No PE widespread bone metastases.  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 She has a new insurance starting next year and we will have to figure out how to get Herceptin injections continued. We decided to do scans towards the end of January.

## 2023-09-13 NOTE — Patient Instructions (Signed)
[facility] - A DEPT OF [facility]  Discharge Instructions: Thank you for choosing [facility] to provide your oncology and hematology care.   If you have a lab appointment with the Cancer Center, please go directly to the Cancer Center and check in at the registration area.   Wear comfortable clothing and clothing appropriate for easy access to any Portacath or PICC line.   We strive to give you quality time with your provider. You may need to reschedule your appointment if you arrive late (15 or more minutes).  Arriving late affects you and other patients whose appointments are after yours.  Also, if you miss three or more appointments without notifying the office, you may be dismissed from the clinic at the provider's discretion.      For prescription refill requests, have your pharmacy contact our office and allow 72 hours for refills to be completed.    Today you received the following chemotherapy and/or immunotherapy agent: Trastuzumab Hyaluronidase (Herceptin Hylecta).   To help prevent nausea and vomiting after your treatment, we encourage you to take your nausea medication as directed.  BELOW ARE SYMPTOMS THAT SHOULD BE REPORTED IMMEDIATELY: *FEVER GREATER THAN 100.4 F (38 C) OR HIGHER *CHILLS OR SWEATING *NAUSEA AND VOMITING THAT IS NOT CONTROLLED WITH YOUR NAUSEA MEDICATION *UNUSUAL SHORTNESS OF BREATH *UNUSUAL BRUISING OR BLEEDING *URINARY PROBLEMS (pain or burning when urinating, or frequent urination) *BOWEL PROBLEMS (unusual diarrhea, constipation, pain near the anus) TENDERNESS IN MOUTH AND THROAT WITH OR WITHOUT PRESENCE OF ULCERS (sore throat, sores in mouth, or a toothache) UNUSUAL RASH, SWELLING OR PAIN  UNUSUAL VAGINAL DISCHARGE OR ITCHING   Items with * indicate a potential emergency and should be followed up as soon as possible or go to the Emergency Department if any problems should occur.  Please show the  CHEMOTHERAPY ALERT CARD or IMMUNOTHERAPY ALERT CARD at check-in to the Emergency Department and triage nurse.  Should you have questions after your visit or need to cancel or reschedule your appointment, please contact [facility] - A DE[facility]  Dept: [phone]  and follow the prompts.  Office hours are 8:00 a.m. to 4:30 p.m. Monday - Friday. Please note that voicemails left after 4:00 p.m. may not be returned until the following business day.  We are closed weekends and major holidays. You have access to a nurse at all times for urgent questions. Please call the main number to the clinic Dept: [phone] and follow the prompts.   For any non-urgent questions, you may also contact your provider using MyChart. We now offer e-Visits for anyone 23 and older to request care online for non-urgent symptoms. For details visit [email or web address].   Also download the MyChart app! Go to the app store, search "MyChart", open the app, select [facility], and log in with your MyChart username and password.  Trastuzumab; Hyaluronidase Injection What is this medication? TRASTUZUMAB; HYALURONIDASE (tras TOO zoo mab; hye al ur ON i dase) treats breast cancer. Trastuzumab works by blocking a protein that causes cancer cells to grow and multiply. This helps to slow or stop the spread of cancer cells. Hyaluronidase works by increasing the absorption of other medications in the body to help them work better. It is a combination medication that contains a monoclonal antibody. This medicine may be used for other purposes; ask your health care provider or pharmacist if you have  questions. COMMON BRAND NAME(S): HERCEPTIN HYLECTA What should I tell my care team before I take this medication? They need to know if you have any of these conditions: Heart failure Lung disease An unusual or allergic reaction to trastuzumab, or other medications, foods, dyes, or  preservatives Pregnant or trying to get pregnant Breast-feeding How should I use this medication? This medication is injected under the skin. It is given by your care team in a hospital or clinic setting. Talk to your care team about the use of this medication in children. It is not approved for use in children. Overdosage: If you think you have taken too much of this medicine contact a poison control center or emergency room at once. NOTE: This medicine is only for you. Do not share this medicine with others. What if I miss a dose? Keep appointments for follow-up doses. It is important not to miss your dose. Call your care team if you are unable to keep an appointment. What may interact with this medication? Certain types of chemotherapy, such as daunorubicin, doxorubicin, epirubicin, idarubicin This list may not describe all possible interactions. Give your health care provider a list of all the medicines, herbs, non-prescription drugs, or dietary supplements you use. Also tell them if you smoke, drink alcohol, or use illegal drugs. Some items may interact with your medicine. What should I watch for while using this medication? Your condition will be monitored carefully while you are receiving this medication. This medication may make you feel generally unwell. This is not uncommon as chemotherapy can affect healthy cells as well as cancer cells. Report any side effects. Continue your course of treatment even though you feel ill unless your care team tells you to stop. This medication may increase your risk of getting an infection. Call your care team for advice if you get a fever, chills, sore throat, or other symptoms of a cold or flu. Do not treat yourself. Try to avoid being around people who are sick. Avoid taking medications that contain aspirin, acetaminophen, ibuprofen, naproxen, or ketoprofen unless instructed by your care team. These medications may hide a fever. Talk to your care team  if you may be pregnant. Serious birth defects can occur if you take this medication during pregnancy and for 7 months after the last dose. You will need a negative pregnancy test before starting this medication. Contraception is recommended while taking this medication and for 7 months after the last dose. Your care team can help you find the option that works for you. Do not breastfeed while taking this medication or for 7 months after the last dose. What side effects may I notice from receiving this medication? Side effects that you should report to your care team as soon as possible: Allergic reactions or angioedema--skin rash, itching or hives, swelling of the face, eyes, lips, tongue, arms, or legs, trouble swallowing or breathing Dry cough, shortness of breath or trouble breathing Heart failure--shortness of breath, swelling of the ankles, feet, or hands, sudden weight gain, unusual weakness or fatigue Heart rhythm changes--fast or irregular heartbeat, dizziness, feeling faint or lightheaded, chest pain, trouble breathing Increase in blood pressure Infection--fever, chills, cough, or sore throat Side effects that usually do not require medical attention (report these to your care team if they continue or are bothersome): Diarrhea Hair loss Headache Muscle pain Nausea Unusual weakness or fatigue This list may not describe all possible side effects. Call your doctor for medical advice about side effects. You may report  side effects to FDA at 1-800-FDA-1088. Where should I keep my medication? This medication is given in a hospital or clinic. It will not be stored at home. NOTE: This sheet is a summary. It may not cover all possible information. If you have questions about this medicine, talk to your doctor, pharmacist, or health care provider.  2024 Elsevier/Gold Standard (2022-01-20 00:00:00)

## 2023-09-14 ENCOUNTER — Ambulatory Visit
Admission: RE | Admit: 2023-09-14 | Discharge: 2023-09-14 | Disposition: A | Discharge status: Home / Self Care | Payer: 59 | Source: Ambulatory Visit | Attending: Radiation Oncology | Admitting: Radiation Oncology

## 2023-09-14 ENCOUNTER — Ambulatory Visit: Payer: 59

## 2023-09-14 ENCOUNTER — Other Ambulatory Visit: Payer: Self-pay

## 2023-09-14 DIAGNOSIS — Z5112 Encounter for antineoplastic immunotherapy: Secondary | ICD-10-CM | POA: Diagnosis not present

## 2023-09-14 LAB — RAD ONC ARIA SESSION SUMMARY
Course Elapsed Days: 1
Plan Fractions Treated to Date: 2
Plan Prescribed Dose Per Fraction: 3.5 Gy
Plan Total Fractions Prescribed: 8
Plan Total Prescribed Dose: 28 Gy
Reference Point Dosage Given to Date: 7 Gy
Reference Point Session Dosage Given: 3.5 Gy
Session Number: 2

## 2023-09-14 LAB — CANCER ANTIGEN 27.29: CA 27.29: 1733.5 U/mL — ABNORMAL HIGH (ref 0.0–38.6)

## 2023-09-15 ENCOUNTER — Encounter: Payer: Self-pay | Admitting: Hematology and Oncology

## 2023-09-16 ENCOUNTER — Other Ambulatory Visit: Payer: Self-pay

## 2023-09-16 ENCOUNTER — Ambulatory Visit: Payer: 59

## 2023-09-16 ENCOUNTER — Ambulatory Visit
Admission: RE | Admit: 2023-09-16 | Discharge: 2023-09-16 | Disposition: A | Discharge status: Home / Self Care | Payer: 59 | Source: Ambulatory Visit | Attending: Radiation Oncology | Admitting: Radiation Oncology

## 2023-09-16 DIAGNOSIS — Z5112 Encounter for antineoplastic immunotherapy: Secondary | ICD-10-CM | POA: Diagnosis not present

## 2023-09-16 LAB — RAD ONC ARIA SESSION SUMMARY
Course Elapsed Days: 3
Plan Fractions Treated to Date: 3
Plan Prescribed Dose Per Fraction: 3.5 Gy
Plan Total Fractions Prescribed: 8
Plan Total Prescribed Dose: 28 Gy
Reference Point Dosage Given to Date: 10.5 Gy
Reference Point Session Dosage Given: 3.5 Gy
Session Number: 3

## 2023-09-17 ENCOUNTER — Ambulatory Visit
Admission: RE | Admit: 2023-09-17 | Discharge: 2023-09-17 | Disposition: A | Discharge status: Home / Self Care | Payer: 59 | Source: Ambulatory Visit | Attending: Radiation Oncology

## 2023-09-17 ENCOUNTER — Other Ambulatory Visit: Payer: Self-pay

## 2023-09-17 ENCOUNTER — Ambulatory Visit: Payer: 59

## 2023-09-17 ENCOUNTER — Ambulatory Visit
Admission: RE | Admit: 2023-09-17 | Discharge: 2023-09-17 | Disposition: A | Discharge status: Home / Self Care | Payer: 59 | Source: Ambulatory Visit | Attending: Radiation Oncology | Admitting: Radiation Oncology

## 2023-09-17 DIAGNOSIS — Z5112 Encounter for antineoplastic immunotherapy: Secondary | ICD-10-CM | POA: Diagnosis not present

## 2023-09-17 LAB — RAD ONC ARIA SESSION SUMMARY
Course Elapsed Days: 4
Plan Fractions Treated to Date: 4
Plan Prescribed Dose Per Fraction: 3.5 Gy
Plan Total Fractions Prescribed: 8
Plan Total Prescribed Dose: 28 Gy
Reference Point Dosage Given to Date: 14 Gy
Reference Point Session Dosage Given: 3.5 Gy
Session Number: 4

## 2023-09-20 ENCOUNTER — Ambulatory Visit: Payer: 59

## 2023-09-20 ENCOUNTER — Other Ambulatory Visit: Payer: Self-pay

## 2023-09-20 ENCOUNTER — Ambulatory Visit
Admission: RE | Admit: 2023-09-20 | Discharge: 2023-09-20 | Disposition: A | Discharge status: Home / Self Care | Payer: 59 | Source: Ambulatory Visit | Attending: Radiation Oncology | Admitting: Radiation Oncology

## 2023-09-20 DIAGNOSIS — Z5112 Encounter for antineoplastic immunotherapy: Secondary | ICD-10-CM | POA: Diagnosis not present

## 2023-09-20 LAB — RAD ONC ARIA SESSION SUMMARY
Course Elapsed Days: 7
Plan Fractions Treated to Date: 5
Plan Prescribed Dose Per Fraction: 3.5 Gy
Plan Total Fractions Prescribed: 8
Plan Total Prescribed Dose: 28 Gy
Reference Point Dosage Given to Date: 17.5 Gy
Reference Point Session Dosage Given: 3.5 Gy
Session Number: 5

## 2023-09-21 ENCOUNTER — Other Ambulatory Visit: Payer: Self-pay

## 2023-09-21 ENCOUNTER — Ambulatory Visit: Payer: 59

## 2023-09-21 ENCOUNTER — Ambulatory Visit
Admission: RE | Admit: 2023-09-21 | Discharge: 2023-09-21 | Disposition: A | Discharge status: Home / Self Care | Payer: 59 | Source: Ambulatory Visit | Attending: Radiation Oncology

## 2023-09-21 DIAGNOSIS — Z5112 Encounter for antineoplastic immunotherapy: Secondary | ICD-10-CM | POA: Diagnosis not present

## 2023-09-21 LAB — RAD ONC ARIA SESSION SUMMARY
Course Elapsed Days: 8
Plan Fractions Treated to Date: 6
Plan Prescribed Dose Per Fraction: 3.5 Gy
Plan Total Fractions Prescribed: 8
Plan Total Prescribed Dose: 28 Gy
Reference Point Dosage Given to Date: 21 Gy
Reference Point Session Dosage Given: 3.5 Gy
Session Number: 6

## 2023-09-23 ENCOUNTER — Ambulatory Visit: Payer: No Typology Code available for payment source

## 2023-09-23 ENCOUNTER — Other Ambulatory Visit: Payer: Self-pay

## 2023-09-23 ENCOUNTER — Ambulatory Visit
Admission: RE | Admit: 2023-09-23 | Discharge: 2023-09-23 | Disposition: A | Discharge status: Home / Self Care | Payer: No Typology Code available for payment source | Source: Ambulatory Visit | Attending: Radiation Oncology | Admitting: Radiation Oncology

## 2023-09-23 DIAGNOSIS — C7951 Secondary malignant neoplasm of bone: Secondary | ICD-10-CM | POA: Insufficient documentation

## 2023-09-23 LAB — RAD ONC ARIA SESSION SUMMARY
Course Elapsed Days: 10
Plan Fractions Treated to Date: 7
Plan Prescribed Dose Per Fraction: 3.5 Gy
Plan Total Fractions Prescribed: 8
Plan Total Prescribed Dose: 28 Gy
Reference Point Dosage Given to Date: 24.5 Gy
Reference Point Session Dosage Given: 3.5 Gy
Session Number: 7

## 2023-09-24 ENCOUNTER — Other Ambulatory Visit: Payer: Self-pay | Admitting: Nurse Practitioner

## 2023-09-24 ENCOUNTER — Ambulatory Visit: Payer: No Typology Code available for payment source

## 2023-09-24 ENCOUNTER — Ambulatory Visit
Admission: RE | Admit: 2023-09-24 | Discharge: 2023-09-24 | Disposition: A | Discharge status: Home / Self Care | Payer: No Typology Code available for payment source | Source: Ambulatory Visit | Attending: Radiation Oncology | Admitting: Radiation Oncology

## 2023-09-24 ENCOUNTER — Other Ambulatory Visit: Payer: Self-pay

## 2023-09-24 DIAGNOSIS — C7951 Secondary malignant neoplasm of bone: Secondary | ICD-10-CM

## 2023-09-24 LAB — RAD ONC ARIA SESSION SUMMARY
Course Elapsed Days: 11
Plan Fractions Treated to Date: 8
Plan Prescribed Dose Per Fraction: 3.5 Gy
Plan Total Fractions Prescribed: 8
Plan Total Prescribed Dose: 28 Gy
Reference Point Dosage Given to Date: 28 Gy
Reference Point Session Dosage Given: 3.5 Gy
Session Number: 8

## 2023-09-27 NOTE — Radiation Completion Notes (Addendum)
  Radiation Oncology         [phone] ________________________________  Name: Natalie Griffin MRN: [id number]  Date: 09/24/2023  DOB: [birth year]  Referring Physician: AUSTIN SKAKLE, M.D. Date of Service: 2023-09-27 Radiation Oncologist: Adina Barge, M.D. [facility]     RADIATION ONCOLOGY END OF TREATMENT NOTE     Diagnosis: 49 yo woman with pain from thoracic spine (T8, T10, T12) bone metastases from cancer of the left upper inner breast - Stage IV   Intent: Palliative     ==========DELIVERED PLANS==========  First Treatment Date: 2023-09-13 Last Treatment Date: 2023-09-24   Plan Name: Spine_T7-L1 Site: Thoracic Spine Technique: 3D Mode: Photon Dose Per Fraction: 3.5 Gy Prescribed Dose (Delivered / Prescribed): 28 Gy / 28 Gy Prescribed Fxs (Delivered / Prescribed): 8 / 8     ==========ON TREATMENT VISIT DATES========== 2023-09-17, 2023-09-24    See weekly On Treatment Notes in Epic for details in the Media tab (listed as Progress notes on the On Treatment Visit Dates listed above).  She tolerated the radiation treatments well without any ill side effects.  The patient will receive a call in about one month from the radiation oncology department. She will continue follow up with her medical oncologist, Dr. [name], as well.  ------------------------------------------------   Donnice Barge, MD [facility]  Radiation Oncology Direct Dial: [phone]  Fax: [phone] [email or web address]  Skype  LinkedIn

## 2023-10-01 ENCOUNTER — Inpatient Hospital Stay
Payer: No Typology Code available for payment source | Attending: Hematology and Oncology | Admitting: Physician Assistant

## 2023-10-01 ENCOUNTER — Ambulatory Visit (HOSPITAL_COMMUNITY)
Admission: RE | Admit: 2023-10-01 | Discharge: 2023-10-01 | Disposition: A | Discharge status: Home / Self Care | Payer: No Typology Code available for payment source | Source: Ambulatory Visit | Attending: Physician Assistant | Admitting: Physician Assistant

## 2023-10-01 ENCOUNTER — Encounter: Payer: Self-pay | Admitting: Hematology and Oncology

## 2023-10-01 ENCOUNTER — Other Ambulatory Visit: Payer: Self-pay | Admitting: *Deleted

## 2023-10-01 ENCOUNTER — Inpatient Hospital Stay: Payer: No Typology Code available for payment source

## 2023-10-01 VITALS — BP 126/72 | HR 96 | Temp 98.3°F | Resp 16 | Wt 139.7 lb

## 2023-10-01 DIAGNOSIS — D701 Agranulocytosis secondary to cancer chemotherapy: Secondary | ICD-10-CM | POA: Diagnosis not present

## 2023-10-01 DIAGNOSIS — M542 Cervicalgia: Secondary | ICD-10-CM | POA: Insufficient documentation

## 2023-10-01 DIAGNOSIS — C781 Secondary malignant neoplasm of mediastinum: Secondary | ICD-10-CM | POA: Diagnosis not present

## 2023-10-01 DIAGNOSIS — C7951 Secondary malignant neoplasm of bone: Secondary | ICD-10-CM | POA: Insufficient documentation

## 2023-10-01 DIAGNOSIS — C50212 Malignant neoplasm of upper-inner quadrant of left female breast: Secondary | ICD-10-CM | POA: Insufficient documentation

## 2023-10-01 DIAGNOSIS — D6481 Anemia due to antineoplastic chemotherapy: Secondary | ICD-10-CM | POA: Diagnosis not present

## 2023-10-01 DIAGNOSIS — T451X5A Adverse effect of antineoplastic and immunosuppressive drugs, initial encounter: Secondary | ICD-10-CM

## 2023-10-01 DIAGNOSIS — Z5112 Encounter for antineoplastic immunotherapy: Secondary | ICD-10-CM | POA: Insufficient documentation

## 2023-10-01 DIAGNOSIS — C50411 Malignant neoplasm of upper-outer quadrant of right female breast: Secondary | ICD-10-CM | POA: Diagnosis not present

## 2023-10-01 DIAGNOSIS — Z170 Estrogen receptor positive status [ER+]: Secondary | ICD-10-CM | POA: Diagnosis not present

## 2023-10-01 LAB — CMP (CANCER CENTER ONLY)
ALT: 25 U/L (ref 0–44)
AST: 38 U/L (ref 15–41)
Albumin: 4.2 g/dL (ref 3.5–5.0)
Alkaline Phosphatase: 247 U/L — ABNORMAL HIGH (ref 38–126)
Anion gap: 8 (ref 5–15)
BUN: 8 mg/dL (ref 6–20)
CO2: 24 mmol/L (ref 22–32)
Calcium: 9.5 mg/dL (ref 8.9–10.3)
Chloride: 101 mmol/L (ref 98–111)
Creatinine: 0.74 mg/dL (ref 0.44–1.00)
GFR, Estimated: >60 mL/min (ref >60)
Glucose, Bld: 120 mg/dL — ABNORMAL HIGH (ref 70–99)
Potassium: 4.1 mmol/L (ref 3.5–5.1)
Sodium: 133 mmol/L — ABNORMAL LOW (ref 135–145)
Total Bilirubin: 0.3 mg/dL (ref 0.0–1.2)
Total Protein: 7.4 g/dL (ref 6.5–8.1)

## 2023-10-01 LAB — CBC WITH DIFFERENTIAL (CANCER CENTER ONLY)
Abs Immature Granulocytes: 0.02 10*3/uL (ref 0.00–0.07)
Basophils Absolute: 0 10*3/uL (ref 0.0–0.1)
Basophils Relative: 1 %
Eosinophils Absolute: 0 10*3/uL (ref 0.0–0.5)
Eosinophils Relative: 1 %
HCT: 31.8 % — ABNORMAL LOW (ref 36.0–46.0)
Hemoglobin: 10.4 g/dL — ABNORMAL LOW (ref 12.0–15.0)
Immature Granulocytes: 1 %
Lymphocytes Relative: 4 %
Lymphs Abs: 0.1 10*3/uL — ABNORMAL LOW (ref 0.7–4.0)
MCH: 27.5 pg (ref 26.0–34.0)
MCHC: 32.7 g/dL (ref 30.0–36.0)
MCV: 84.1 fL (ref 80.0–100.0)
Monocytes Absolute: 0.8 10*3/uL (ref 0.1–1.0)
Monocytes Relative: 21 %
Neutro Abs: 2.6 10*3/uL (ref 1.7–7.7)
Neutrophils Relative %: 72 %
Platelet Count: 178 10*3/uL (ref 150–400)
RBC: 3.78 MIL/uL — ABNORMAL LOW (ref 3.87–5.11)
RDW: 15.6 % — ABNORMAL HIGH (ref 11.5–15.5)
WBC Count: 3.6 10*3/uL — ABNORMAL LOW (ref 4.0–10.5)
nRBC: 0 % (ref 0.0–0.2)

## 2023-10-01 NOTE — Progress Notes (Signed)
 Received mychart message from pt with complaint of severe neck pain and fever like symptoms.  Pt unable to check p.o temperature due to not having a thermometer.  MD out of office, RN reviewed with [facility] and verbal orders received to obtain STAT xray of cervical spine.  Orders placed, pt notified and verbalized understanding.

## 2023-10-01 NOTE — Progress Notes (Addendum)
 Symptom Management Consult Note Natalie Griffin    Patient Care Team: Natalie Potts, MD as PCP - General (Hematology and Oncology) Natalie Nanetta SAILOR, RN as Oncology Nurse Navigator Natalie Stephane BROCKS, RN as Oncology Nurse Navigator Natalie Ash, MD as Consulting Physician (Hematology and Oncology) Pickenpack-Cousar, Natalie Griffin SAILOR, NP as Nurse Practitioner [facility] and Palliative Medicine)    Name / MRN / DOB: Natalie Griffin  [id number]  1975/12/05   Date of visit: 10/01/2023   Chief Complaint/Reason for visit: neck pain   Current Therapy: Herceptin  Hycleta and Anastrozole   Last treatment:  Day 1   Cycle 6 on 09/13/23   ASSESSMENT & PLAN: Patient is a 48 y.o. female with oncologic history of metastatic breast cancer followed by Dr. Odean.  I have viewed most recent oncology note and lab work.    #Metastatic Breast Cancer - Radiation to thoracic spine final treatment was 09/24/23 - Next appointment with oncologist is 11/09/23  #Neck pain - Acute onset of neck pain starting three days ago, progressively worsening, with decreased range of motion and tenderness in the C1-C2 region. Also had left shoulder injury x 2 days prior to onset of neck pain. Pain is described as muscular.  -Differential diagnosis of her pain today includes worsening mets, fracture, muscular strain and bacterial infection including meningitis.  -Xray of c-spine today shows no evidence of cervical spine fracture or prevertebral soft tissue swelling. No acute osseous abnormality seen. Discussed results of previous scans including MRI of thoracic spine from 09/06/23 and MRI of cervical spine 03/25/23 that shows metastatic disease of cervical spine and skull base which correlates with her pain. Patient's pain improved after taking pain medication this morning and applying salonpas patch. She has muscle relaxer at home that she has not tried. - PE without bony tenderness, does have bilateral paraspinous  muscles TTP. No nuchal rigidity, normal neuro exam of upper extremities. Patient is afebrile and well appearing. - Lengthy discussion had regarding signs of meningitis to watch for. Discussed ED evaluation now to further work up her symptoms with concern for her reported subjective fever and neck pain. She prefers to closely monitor symptoms at home. Patient will get a thermometer that way she can monitor temperature. Strict ED precautions discussed should symptoms worsen.  #Leukopenia and anemia -WBC 3.6 and HgB 10.4. Likely related to treatment. Discussed neutropenic precautions. Anemia is stable and she denies any active bleeding.    Heme/Onc History: Oncology History  Malignant neoplasm of upper-inner quadrant of left breast in female, estrogen receptor positive (HCC)  04/11/2020 Initial Diagnosis   Patient palpated a left breast lump. Mammogram on 03/08/20 showed a 3.0cm mass at the 11 o'clock position with no axillary adenopathy. 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   05/09/2020 Cancer Staging   Staging form: Breast, AJCC 8th Edition - Clinical stage from 05/09/2020: Stage IB (cT2, cN0, cM0, G2, ER+, PR+, HER2+) - Signed by Natalie Potts, MD on 05/09/2020   06/04/2020 Surgery   Left lumpectomy (Natalie Griffin): invasive and in situ ductal carcinoma, 3.2cm, clear margins, one left axillary lymph node negative for carcinoma.    02/2021 Relapse/Recurrence   Patient went to orthopedics for right hip pain.  Imaging revealed bone metastasis.  CT CAP: Cortical destruction of the right femoral neck consistent with skeletal metastases at risk for pathologic fracture.  Lucent lesion in the left iliac bone and L3 vertebral body consistent  with multifocal skeletal metastases.   04/01/2021 Surgery   04/01/2021: Femoral intramedullary nail  Pathology review: Metastatic breast cancer ER 2% PR 0% HER2 3+ positive   04/08/2021 -   Anti-estrogen oral therapy   Tamoxifen  20 mg was prescribed on 04/08/2021 (discontinued by patient because she felt that the risks and benefits did not support it )--resumed in 07/2022 based on pathology results from right breast biopsy   05/14/2021 -  Chemotherapy   Subcutaneous Herceptin  Perjeta  (Phesgo ) injection started 05/14/2021, switched to Herceptin  Hylecta 06/11/2021 (Fixed drug eruption, profound diarrhea to Phesgo )     05/20/2021 - 06/03/2021 Radiation Therapy   Palliative radiation to the iliac bone and hip   02/04/2022 Imaging   MRI thoracic spine 02/04/2022: Numerous bone metastases thoracic spine cervical and lumbar spines largest T5, T8, T11.  Chronic compression fractures T4, T5, T10 and T12    05/07/2022 Imaging   CT chest abdomen pelvis: No new or progressive bone metastases.  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   07/29/2022 PET scan   IMPRESSION: 1. Hypermetabolic mass in the posterior deep RIGHT breast consistent primary breast carcinoma. 2. Central nodal mediastinal metastasis to the high LEFT prevascular space and LEFT super clavicular node. 3. 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  Natalie Griffin recommended changing treatment to Natalie Griffin and Natalie Griffin wanted to wait until after the holidays.  She will discuss further with Dr. [name] in January.   11/19/2022 - 11/19/2022 Chemotherapy   Patient is on Treatment Plan : BREAST Trastuzumab  IV (8/6) or SQ (600) D1 q21d     04/20/2023 -  Chemotherapy   Patient is on Treatment Plan : BREAST MAINTENANCE Trastuzumab  IV (6) or SQ (600) D1 q21d x 13 cycles         Interval history-: Discussed the use of AI scribe software for clinical note transcription with  the patient, who gave verbal consent to proceed.  Natalie Griffin is a 48 y.o. female with oncologic history as above presenting to [facility] today with chief complaint of neck pain. Patient presents unaccompanied to visit today.  Patient reports new onset neck pain that started x 3 days ago. The pain was initially described as stiff and bone-like, but has since evolved to a more muscular sensation, spreading around the sides and limiting the range of motion. The patient reported pain when turning neck side to side. She also endorses subjective fevers. She does not have a thermometer at home. In addition to the neck pain, the patient reported an incident of a left shoulder injury approximately 3 days prior to the onset of the neck pain. The injury occurred while lifting a water  jug, during which the patient heard a 'pop' sound. Since then, the patient has been experiencing a sharp, stabbing pain in the left shoulder, which is triggered by certain movements. However, this shoulder pain does not radiate up to the neck. The patient has been managing the pain with as-needed pain medication, which they reported has helped to some extent. She also applied ice and a Salonpas patch, which provided some relief. The patient has muscle relaxers at home, which they have not taken recently but reported that they were helpful in the past. Denies headaches, confusion, abnormal bruising, rash, light sensitivity.  ROS  All other systems are reviewed and are negative for acute change except as noted in the HPI.    Allergies  Allergen Reactions   Adhesive [Tape] Other (See Comments)    Blistering with steri strips   Fentanyl  Itching, Nausea Only and Rash    08/11/2022 reports removing patch two days ago. Experienced rash, itching, nausea, headache and breathing affected.      Phesgo  [Pertuz-Trastuz-Hyaluron-Zzxf] Diarrhea and Rash    Phesgo  side effect: Fixed drug eruption, profound diarrhea Therefore we will  discontinue Phesgo  and start her on Herceptin  Hylecta Inj   Tetanus Toxoid Anaphylaxis   Succinylcholine Other (See Comments)    Her Body does not have enough enzymes to carry it out of her body     Past Medical History:  Diagnosis Date   Anemia    Anxiety    Bone metastasis    Breast cancer (HCC) 2021   Left breast invasive ductal carcinoma   Cancer (HCC) 2021   Left breast   Complication of anesthesia    pseudocholinesterase deficiency   Family history of adverse reaction to anesthesia    Father and Aunt have pseudocholinesterase deficiency - Trouble waking up from anesthesia   History of hiatal hernia    History of kidney stones    noted on CT Left nonobstructive   Pseudocholinesterase deficiency      Past Surgical History:  Procedure Laterality Date   BREAST BIOPSY Right 07/21/2022   US  RT BREAST BX W LOC DEV 1ST LESION IMG BX SPEC US  GUIDE 07/21/2022 [facility]   BREAST BIOPSY  06/08/2023   MM RT RADIOACTIVE SEED LOC MAMMO GUIDE 06/08/2023 [facility]   BREAST LUMPECTOMY WITH RADIOACTIVE SEED AND SENTINEL LYMPH NODE BIOPSY Left 06/04/2020   Procedure: LEFT BREAST LUMPECTOMY WITH RADIOACTIVE SEED AND SENTINEL LYMPH NODE MAPPING;  Surgeon: Vanderbilt Ned, MD;  Location: [facility];  Service: General;  Laterality: Left;  PEC BLOCK, RNFA   BREAST LUMPECTOMY WITH RADIOACTIVE SEED LOCALIZATION Right 06/09/2023   Procedure: RIGHT BREAST LUMPECTOMY WITH RADIOACTIVE SEED LOCALIZATION;  Surgeon: Vanderbilt Ned, MD;  Location: [facility];  Service: General;  Laterality: Right;   FEMUR IM NAIL Right 04/01/2021   Procedure: INTRAMEDULLARY (IM) NAIL FEMORAL;  Surgeon: Beverley Evalene BIRCH, MD;  Location: [facility];  Service: Orthopedics;  Laterality: Right;   ROBOTIC ASSISTED BILATERAL SALPINGO OOPHERECTOMY Bilateral 09/30/2022   Procedure: XI ROBOTIC ASSISTED BILATERAL SALPINGO OOPHORECTOMY;  Surgeon: Viktoria Comer SAUNDERS, MD;  Location: [facility];  Service: Gynecology;   Laterality: Bilateral;   WISDOM TOOTH EXTRACTION      Social History   Socioeconomic History   Marital status: Single    Spouse name: Not on file   Number of children: Not on file   Years of education: Not on file   Highest education level: Not on file  Occupational History   Not on file  Tobacco Use   Smoking status: Former    Current packs/day: 0.00    Types: Cigarettes    Quit date: 05/19/2020    Years since quitting: 3.3   Smokeless tobacco: Never  Vaping Use   Vaping status: Never Used  Substance and Sexual Activity   Alcohol use: Not Currently   Drug use: Never   Sexual activity: Not Currently  Other Topics Concern   Not on file  Social History Narrative   Not on file   Social Drivers of Health   Financial Resource Strain: Not on file  Food Insecurity:  No Food Insecurity (06/28/2023)   Hunger Vital Sign    Worried About Running Out of Food in the Last Year: Never true    Ran Out of Food in the Last Year: Never true  Transportation Needs: No Transportation Needs (06/28/2023)   PRAPARE - Administrator, Civil Service (Medical): No    Lack of Transportation (Non-Medical): No  Physical Activity: Not on file  Stress: Not on file  Social Connections: Not on file  Intimate Partner Violence: Not At Risk (06/28/2023)   Humiliation, Afraid, Rape, and Kick questionnaire    Fear of Current or Ex-Partner: No    Emotionally Abused: No    Physically Abused: No    Sexually Abused: No    Family History  Problem Relation Age of Onset   Colon cancer Neg Hx    Esophageal cancer Neg Hx    Stomach cancer Neg Hx    Rectal cancer Neg Hx      Current Outpatient Medications:    ALPRAZolam  (XANAX ) 0.5 MG tablet, Take 1 tablet (0.5 mg total) by mouth 3 (three) times daily as needed for anxiety., Disp: 90 tablet, Rfl: 3   anastrozole  (ARIMIDEX ) 1 MG tablet, Take 1 tablet (1 mg total) by mouth daily., Disp: 90 tablet, Rfl: 3   methocarbamol  (ROBAXIN ) 500 MG tablet,  Take 1 tablet (500 mg total) by mouth 3 (three) times daily., Disp: 90 tablet, Rfl: 1   morphine  (MS CONTIN ) 60 MG 12 hr tablet, Take 1 tablet (60 mg total) by mouth 3 (three) times daily., Disp: 90 tablet, Rfl: 0   Multiple Vitamin (ONE DAILY MULTIVITAMIN ADULT PO), Take 1 tablet by mouth daily., Disp: , Rfl:    Oxycodone  HCl 10 MG TABS, Take 1 tablet (10 mg total) by mouth every 4 (four) hours as needed for severe pain (pain score 7-10)., Disp: 120 tablet, Rfl: 0  PHYSICAL EXAM: ECOG FS:1 - Symptomatic but completely ambulatory    Vitals:   10/01/23 1359 10/01/23 1420  BP: 126/72   Pulse: 96   Resp: 16   Temp: 99.2 F (37.3 C) 98.3 F (36.8 C)  TempSrc: Oral Oral  SpO2: 96%   Weight: 139 lb 11.2 oz (63.4 kg)    Physical Exam Vitals and nursing note reviewed.  Constitutional:      Appearance: She is not ill-appearing or toxic-appearing.  HENT:     Head: Normocephalic.  Eyes:     Conjunctiva/sclera: Conjunctivae normal.  Neck:     Comments: No nuchal rigidity. Decreased ROM turning neck from right to left secondary to pain.   No bony stepoffs or deformities, bilateral paraspinous muscles TTP, no muscle spasms. No rigidity or meningeal signs. No bruising, erythema, or swelling. Cardiovascular:     Rate and Rhythm: Normal rate and regular rhythm.     Pulses: Normal pulses.     Heart sounds: Normal heart sounds.  Pulmonary:     Effort: Pulmonary effort is normal.     Breath sounds: Normal breath sounds.  Abdominal:     General: There is no distension.  Skin:    General: Skin is warm and dry.     Findings: No rash.  Neurological:     Mental Status: She is alert.     Comments: Equal grip strength in bilateral upper extremities. Sensation intact. Full ROM of upper extremities        LABORATORY DATA: I have reviewed the data as listed    Latest Ref Rng & Units  10/01/2023    1:46 PM 09/13/2023   12:06 PM 08/20/2023   11:56 AM  CBC  WBC 4.0 - 10.5 K/uL 3.6  6.1  7.3    Hemoglobin 12.0 - 15.0 g/dL 89.5  89.2  88.9   Hematocrit 36.0 - 46.0 % 31.8  34.2  34.5   Platelets 150 - 400 K/uL 178  330  333         Latest Ref Rng & Units 10/01/2023    1:46 PM 09/13/2023   12:06 PM 06/27/2023    7:09 AM  CMP  Glucose 70 - 99 mg/dL 879  98  880   BUN 6 - 20 mg/dL 8  16  13    Creatinine 0.44 - 1.00 mg/dL 9.25  9.17  9.11   Sodium 135 - 145 mmol/L 133  140  137   Potassium 3.5 - 5.1 mmol/L 4.1  4.0  3.4   Chloride 98 - 111 mmol/L 101  106  105   CO2 22 - 32 mmol/L 24  25  19    Calcium  8.9 - 10.3 mg/dL 9.5  89.7  9.3   Total Protein 6.5 - 8.1 g/dL 7.4  7.4  7.0   Total Bilirubin 0.0 - 1.2 mg/dL 0.3  0.3  0.5   Alkaline Phos 38 - 126 U/L 247  249  241   AST 15 - 41 U/L 38  20  16   ALT 0 - 44 U/L 25  8  9         RADIOGRAPHIC STUDIES (from last 24 hours if applicable) I have personally reviewed the radiological images as listed and agreed with the findings in the report. DG Cervical Spine 2-3 Views Result Date: 10/01/2023 CLINICAL DATA:  Neck pain, history of osseous metastatic disease. EXAM: CERVICAL SPINE - 2-3 VIEW COMPARISON:  07/16/2022 cervical spine radiographs, correlation is also made with 03/25/2023 MRI cervical spine FINDINGS: There is no evidence of cervical spine fracture or prevertebral soft tissue swelling. Alignment is normal. Multifocal anterior osteophytes, without significant disc height loss. The osseous metastatic disease visible on MRI is occult on radiographs. No other significant bone abnormalities are identified. IMPRESSION: No acute osseous abnormality. The osseous metastatic disease visible on MRI is occult on radiographs. Electronically Signed   By: Donald Campion M.D.   On: 10/01/2023 13:35        Visit Diagnosis: 1. Neck pain   2. Metastatic cancer to spine (HCC)   3. Chemotherapy-induced neutropenia (HCC)   4. Anemia due to antineoplastic chemotherapy      No orders of the defined types were placed in this  encounter.   All questions were answered. The patient knows to call the clinic with any problems, questions or concerns. No barriers to learning was detected.  A total of more than 30 minutes were spent on this encounter with face-to-face time and non-face-to-face time, including preparing to see the patient, ordering tests and/or medications, counseling the patient and coordination of care as outlined above.    Thank you for allowing me to participate in the care of this patient.    Lucian Baswell E  Walisiewicz, PA-C Department of Hematology/Oncology [facility] at [facility] Phone: [phone]  [phone]    10/01/2023 3:28 PM

## 2023-10-07 ENCOUNTER — Other Ambulatory Visit: Payer: Self-pay | Admitting: Hematology and Oncology

## 2023-10-07 ENCOUNTER — Ambulatory Visit: Payer: 59

## 2023-10-07 ENCOUNTER — Encounter: Payer: Self-pay | Admitting: Hematology and Oncology

## 2023-10-07 ENCOUNTER — Other Ambulatory Visit (HOSPITAL_COMMUNITY): Payer: Self-pay

## 2023-10-07 ENCOUNTER — Other Ambulatory Visit: Payer: Self-pay

## 2023-10-07 MED ORDER — ANASTROZOLE 1 MG PO TABS
1.0000 mg | ORAL_TABLET | Freq: Every day | ORAL | 3 refills | Status: AC
  Filled 2023-10-07: qty 90, 90d supply, fill #0
  Filled 2024-01-05: qty 90, 90d supply, fill #1
  Filled 2024-04-06 – 2024-04-11 (×2): qty 90, 90d supply, fill #2
  Filled 2024-07-07: qty 90, 90d supply, fill #3

## 2023-10-08 NOTE — Progress Notes (Deleted)
Palliative Medicine [facility]  [phone] [phone]   Name: Natalie Griffin Date: 10/08/2023 MRN: [id number]  DOB: 04/20/1976  Patient Care Team: Serena Croissant, MD as PCP - General (Hematology and Oncology) Donnelly Angelica, RN as Oncology Nurse Navigator Pershing Proud, RN as Oncology Nurse Navigator Rachel Moulds, MD as Consulting Physician (Hematology and Oncology) Pickenpack-Cousar, Arty Baumgartner, NP as Nurse Practitioner (Hospice and Palliative Medicine)    INTERVAL HISTORY: Natalie Griffin is a 48 y.o. 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  Palliative ask to see for symptom management and goals of care.   SOCIAL HISTORY:     reports that she quit smoking about 3 years ago. Her smoking use included cigarettes. She has never used smokeless tobacco. She reports that she does not currently use alcohol. She reports that she does not use drugs.  ADVANCE DIRECTIVES:  Advanced directives on file naming Natalie Griffin as patient healthcare power of attorney if patient becomes unable to speak for themselves.   CODE STATUS: DNR  PAST MEDICAL HISTORY: Past Medical History:  Diagnosis Date   Anemia    Anxiety    Bone metastasis    Breast cancer (HCC) 2021   Left breast invasive ductal carcinoma   Cancer (HCC) 2021   Left breast   Complication of anesthesia    pseudocholinesterase deficiency   Family history of adverse reaction to anesthesia    Father and Aunt have pseudocholinesterase deficiency - Trouble waking up from anesthesia   History of hiatal hernia    History of kidney stones    noted on CT Left nonobstructive   Pseudocholinesterase deficiency     ALLERGIES:  is allergic to adhesive [tape], fentanyl, phesgo [pertuz-trastuz-hyaluron-zzxf], tetanus toxoid, and succinylcholine.  MEDICATIONS:  Current Outpatient Medications   Medication Sig Dispense Refill   ALPRAZolam (XANAX) 0.5 MG tablet Take 1 tablet (0.5 mg total) by mouth 3 (three) times daily as needed for anxiety. 90 tablet 3   anastrozole (ARIMIDEX) 1 MG tablet Take 1 tablet (1 mg total) by mouth daily. 90 tablet 3   methocarbamol (ROBAXIN) 500 MG tablet Take 1 tablet (500 mg total) by mouth 3 (three) times daily. 90 tablet 1   morphine (MS CONTIN) 60 MG 12 hr tablet Take 1 tablet (60 mg total) by mouth 3 (three) times daily. 90 tablet 0   Multiple Vitamin (ONE DAILY MULTIVITAMIN ADULT PO) Take 1 tablet by mouth daily.     Oxycodone HCl 10 MG TABS Take 1 tablet (10 mg total) by mouth every 4 (four) hours as needed for severe pain (pain score 7-10). 120 tablet 0   No current facility-administered medications for this visit.    VITAL SIGNS: There were no vitals taken for this visit. There were no vitals filed for this visit.  Estimated body mass index is 23.25 kg/m as calculated from the following:   Height as of 09/13/23: 5\' 5"  (1.651 m).   Weight as of 10/01/23: 139 lb 11.2 oz (63.4 kg).   PERFORMANCE STATUS (ECOG) : 1 - Symptomatic but completely ambulatory   Physical Exam General: NAD Cardiovascular: regular rate and rhythm Pulmonary: clear ant fields Abdomen: soft, nontender, + bowel sounds Extremities: no edema, no joint deformities Skin: no rashes Neurological:   IMPRESSION: ***  We discussed Her current illness and what it means in the larger context of Her on-going co-morbidities. Natural disease trajectory  and expectations were discussed.  I discussed the importance of continued conversation with family and their medical providers regarding overall plan of care and treatment options, ensuring decisions are within the context of the patients values and GOCs.  PLAN: Established therapeutic relationship. Education provided on palliative's role in collaboration with their Oncology/Radiation team.  Metastatic Cancer with Spinal  Involvement Severe pain managed with Morphine 60mg  every 8 hours and Oxycodone 10mg  every 4 hours. Patient reports these medications are currently effective. Noted increased pain with weather changes. -Continue current pain management regimen. -Robaxin every 8 hours as needed for muscle spasms.  -Declines desire for changes to current regimen.   Muscle Spasms Patient reports muscle spasms in lower spine due to cancer and right hip, possibly related to scar tissue from previous hip pinning surgery. -Prescribe Methocarbamol (Robaxin) for muscle spasms as needed.  Radiation Therapy Patient is starting radiation therapy for bone/spinal metastasis. -Continue with planned radiation therapy per Oncology team.  Pain Management Contract Discussed the importance of consistent and safe use of pain medications, and the need for clear communication regarding changes in pain levels. -Signed pain management contract with patient. Patient provided with copy.  -All questions answered.   Follow-up Regular monitoring of pain levels and adjustment of medications as needed. -Plan for regular follow-up to monitor pain levels and adjust medications as needed. -Will plan to follow-up with patient in 3-4 weeks. Sooner if needed.   Patient expressed understanding and was in agreement with this plan. She also understands that She can call the clinic at any time with any questions, concerns, or complaints.   Any controlled substances utilized were prescribed in the context of palliative care. PDMP has been reviewed.    Visit consisted of counseling and education dealing with the complex and emotionally intense issues of symptom management and palliative care in the setting of serious and potentially life-threatening illness.  Willette Alma, AGPCNP-BC  Palliative Medicine Team/[facility]

## 2023-10-11 ENCOUNTER — Encounter: Payer: No Typology Code available for payment source | Admitting: Nurse Practitioner

## 2023-10-11 ENCOUNTER — Ambulatory Visit: Payer: Self-pay

## 2023-10-11 ENCOUNTER — Other Ambulatory Visit: Payer: Self-pay | Admitting: Hematology and Oncology

## 2023-10-12 ENCOUNTER — Ambulatory Visit: Payer: No Typology Code available for payment source

## 2023-10-12 ENCOUNTER — Other Ambulatory Visit (HOSPITAL_COMMUNITY): Payer: Self-pay

## 2023-10-12 ENCOUNTER — Encounter: Payer: Self-pay | Admitting: Hematology and Oncology

## 2023-10-13 ENCOUNTER — Other Ambulatory Visit (HOSPITAL_COMMUNITY): Payer: Self-pay

## 2023-10-13 ENCOUNTER — Encounter: Payer: Self-pay | Admitting: Nurse Practitioner

## 2023-10-13 ENCOUNTER — Ambulatory Visit: Payer: No Typology Code available for payment source

## 2023-10-13 ENCOUNTER — Inpatient Hospital Stay (HOSPITAL_BASED_OUTPATIENT_CLINIC_OR_DEPARTMENT_OTHER): Payer: No Typology Code available for payment source | Admitting: Nurse Practitioner

## 2023-10-13 ENCOUNTER — Inpatient Hospital Stay: Payer: No Typology Code available for payment source

## 2023-10-13 VITALS — BP 115/78 | HR 89 | Temp 97.7°F | Resp 17 | Ht 65.0 in | Wt 140.3 lb

## 2023-10-13 DIAGNOSIS — Z170 Estrogen receptor positive status [ER+]: Secondary | ICD-10-CM

## 2023-10-13 DIAGNOSIS — C7951 Secondary malignant neoplasm of bone: Secondary | ICD-10-CM | POA: Diagnosis not present

## 2023-10-13 DIAGNOSIS — C50212 Malignant neoplasm of upper-inner quadrant of left female breast: Secondary | ICD-10-CM

## 2023-10-13 DIAGNOSIS — Z515 Encounter for palliative care: Secondary | ICD-10-CM

## 2023-10-13 DIAGNOSIS — G893 Neoplasm related pain (acute) (chronic): Secondary | ICD-10-CM | POA: Diagnosis not present

## 2023-10-13 DIAGNOSIS — Z5112 Encounter for antineoplastic immunotherapy: Secondary | ICD-10-CM | POA: Diagnosis not present

## 2023-10-13 DIAGNOSIS — R110 Nausea: Secondary | ICD-10-CM

## 2023-10-13 MED ORDER — MORPHINE SULFATE ER 30 MG PO TBCR
EXTENDED_RELEASE_TABLET | ORAL | 0 refills | Status: DC
  Filled 2023-10-13: qty 90, 30d supply, fill #0

## 2023-10-13 MED ORDER — TRASTUZUMAB-HYALURONIDASE-OYSK 600-10000 MG-UNT/5ML ~~LOC~~ SOLN
600.0000 mg | Freq: Once | SUBCUTANEOUS | Status: AC
  Administered 2023-10-13: 600 mg via SUBCUTANEOUS
  Filled 2023-10-13: qty 5

## 2023-10-13 MED ORDER — MORPHINE SULFATE ER 15 MG PO TBCR: EXTENDED_RELEASE_TABLET | ORAL | 0 refills | Status: DC

## 2023-10-13 MED ORDER — OXYCODONE-ACETAMINOPHEN 5-325 MG PO TABS
1.0000 | ORAL_TABLET | Freq: Four times a day (QID) | ORAL | 0 refills | Status: DC | PRN
  Filled 2023-10-13: qty 90, 12d supply, fill #0

## 2023-10-13 NOTE — Patient Instructions (Addendum)
Ms. Natalie Griffin thank you for coming in for your visit today.   During today's visit, we discussed your concerns about your current pain management regimen, including significant weight loss, constant nausea, and fluctuating bowel habits. You expressed a desire to discontinue oxycodone due to severe nausea and return to Percocet, which you tolerated well in the past.   YOUR PLAN:  -CHRONIC PAIN MANAGEMENT: Chronic pain management involves controlling long-term pain. We will discontinue oxycodone and start you on Percocet 5/325mg , 1-2 tablets every 6 hours as needed for pain. You will continue taking MS Contin 60mg , 2 capsules (30 mg each) every 12 hours for 2 weeks, then gradually reduce the dosage every 2 weeks until it is discontinued. We will check in with you in 2 weeks to assess your pain control and tolerance of the new regimen. If pain worsens or decreasing dose causes more noticeable pain. Please stop weaning and maintain at current dose.   Week 1 & 2: Take 2 MS Contin (30mg ) every 12 hours Week 3 & 4: Take 1 MS Contin (30mg ) and 1 MS Contin (15mg ) to toal 45 mg every 12 hours  Week 5 & 6: Take 1 MS Contin (30mg ) every 12 hours Week 7& 8: Take 1 MS Contin (15 mg) every 12 hours Week 9: Take 1 MS Contin  (15 mg) once daily Week 10: Take 1 MS Contin (15 mg) every other day and then stop.    INSTRUCTIONS:  Please follow up as needed to assess your pain control and tolerance of the new pain management regimen. Additionally, schedule a follow-up appointment in 1 month to review your overall progress and any other concerns.

## 2023-10-13 NOTE — Progress Notes (Signed)
Palliative Medicine [facility]  [phone] [phone]   Name: Natalie Griffin Date: 10/13/2023 MRN: [id number]  DOB: Apr 27, 1976  Patient Care Team: Serena Croissant, MD as PCP - General (Hematology and Oncology) Donnelly Angelica, RN as Oncology Nurse Navigator Pershing Proud, RN as Oncology Nurse Navigator Rachel Moulds, MD as Consulting Physician (Hematology and Oncology) Pickenpack-Cousar, Arty Baumgartner, NP as Nurse Practitioner (Hospice and Palliative Medicine)    INTERVAL HISTORY: Natalie Griffin is a 48 y.o. 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  Palliative ask to see for symptom management and goals of care.   SOCIAL HISTORY:     reports that she quit smoking about 3 years ago. Her smoking use included cigarettes. She has never used smokeless tobacco. She reports that she does not currently use alcohol. She reports that she does not use drugs.  ADVANCE DIRECTIVES:  Advanced directives on file naming [name] as patient healthcare power of attorney if patient becomes unable to speak for themselves.   CODE STATUS: DNR  PAST MEDICAL HISTORY: Past Medical History:  Diagnosis Date   Anemia    Anxiety    Bone metastasis    Breast cancer (HCC) 2021   Left breast invasive ductal carcinoma   Cancer (HCC) 2021   Left breast   Complication of anesthesia    pseudocholinesterase deficiency   Family history of adverse reaction to anesthesia    Father and Aunt have pseudocholinesterase deficiency - Trouble waking up from anesthesia   History of hiatal hernia    History of kidney stones    noted on CT Left nonobstructive   Pseudocholinesterase deficiency     ALLERGIES:  is allergic to adhesive [tape], fentanyl, phesgo [pertuz-trastuz-hyaluron-zzxf], tetanus toxoid, and succinylcholine.  MEDICATIONS:  Current Outpatient Medications   Medication Sig Dispense Refill   [START ON 10/26/2023] morphine (MS CONTIN) 15 MG 12 hr tablet Take 1 capsule every 12 hours in addition to 30mg  tablet to total 45mg . 60 tablet 0   morphine (MS CONTIN) 30 MG 12 hr tablet Take 2 tablets (60 mg) every 12 hours x 2 weeks. Then take 1 tablet (30mg ) every 12 hours in addition to 15mg  to total 45 mg x 2 weeks. 90 tablet 0   oxyCODONE-acetaminophen (PERCOCET/ROXICET) 5-325 MG tablet Take 1-2 tablets by mouth every 6 (six) hours as needed for severe pain (pain score 7-10). 90 tablet 0   ALPRAZolam (XANAX) 0.5 MG tablet Take 1 tablet (0.5 mg total) by mouth 3 (three) times daily as needed for anxiety. 90 tablet 3   anastrozole (ARIMIDEX) 1 MG tablet Take 1 tablet (1 mg total) by mouth daily. 90 tablet 3   methocarbamol (ROBAXIN) 500 MG tablet Take 1 tablet (500 mg total) by mouth 3 (three) times daily. 90 tablet 1   morphine (MS CONTIN) 60 MG 12 hr tablet Take 1 tablet (60 mg total) by mouth 3 (three) times daily. 90 tablet 0   Multiple Vitamin (ONE DAILY MULTIVITAMIN ADULT PO) Take 1 tablet by mouth daily.     No current facility-administered medications for this visit.    VITAL SIGNS: BP 115/78 (BP Location: Right Arm, Patient Position: Sitting)   Pulse 89   Temp 97.7 F (36.5 C) (Temporal)   Resp 17   Ht 5\' 5"  (1.651 m)   Wt 140 lb 5 oz (63.6 kg)   SpO2 100%   BMI 23.35  kg/m  Filed Weights   10/13/23 1505  Weight: 140 lb 5 oz (63.6 kg)    Estimated body mass index is 23.35 kg/m as calculated from the following:   Height as of this encounter: 5\' 5"  (1.651 m).   Weight as of this encounter: 140 lb 5 oz (63.6 kg).   PERFORMANCE STATUS (ECOG) : 1 - Symptomatic but completely ambulatory   Physical Exam General: NAD Cardiovascular: regular rate and rhythm Pulmonary: normal breathing pattern  Skin: no rashes Neurological: AAO x3  Discussed the use of AI scribe software for clinical note transcription with the patient, who gave verbal  consent to proceed.  IMPRESSION:  Natalie Griffin presents to clinic for symptom management follow-up.  She is concerned about her current pain management regimen. Also reports weight loss, constant nausea, and fluctuating bowel habits, including diarrhea and constipation, which she associates with her current pain medications. Current weight is 140 pounds down from 147 pounds on 12/20.    The patient shares her mother recently passed away in [place] and she is under some level of stress managing affairs with other family members.  Emotional support provided.  Sabria specifically identifies oxycodone as the primary source of her discomfort, causing severe nausea. She expresses a desire to discontinue oxycodone and return to her previous medication, Percocet, which she tolerated well.   The patient's pain, previously severe, has notably decreased. She reports current pain in her lower spine and left shoulder, which she attributes to recent cold weather and changes in barometric pressure.  She also mentions a recent viral illness, which she believes contributed to a prolonged period of neck pain and a sensation of a knot behind her ear.  Ms. Kunkler' expressed goal is to wean off her current pain medication due to the unpleasant side effects and because her pain is less severe. She expresses a strong desire to return to a previous medication, Percocet, which was better tolerated. She is also open to a gradual reduction in her current medication, morphine, with the ultimate goal of discontinuation.  We discussed her current regimen at length.  Unfortunately she is experiencing some side effects after running out of her medication on January 18th.  Patient has self weaned down her dose of MS Contin from every 12 hours to every 8 hours.  Detailed discussion regarding weaning process while closely evaluating her level of pain and adjusting according to her body.  We discussed a plan of transitioning her  oxycodone to Percocet 5 mg / 325 mg every 6 hours as needed for breakthrough pain.  Will restart her MS Contin 60 mg every 12 hours for 2 weeks followed by a decrease to MS Contin 45 mg every 12 hours for 2 weeks.  If tolerated and pain remains well-controlled we will continue with weaning process of MS Contin 30 mg every 2 weeks followed by MS Contin 15 mg every 2 weeks eventually allowing patient to discontinue completely.  I again emphasized importance of listening to her body and assessing her pain.  At any point during the weaning process patient's pain escalates or is uncontrolled we will sustain at current dosage before proceeding with further weaning process.  The patient verbalized understanding and appreciation.  All refill sent to her requested [facility] health pharmacy.  Patient is aware medications may require prior authorization.   Assessment and Plan  Chronic Cancer Related Pain Management Patient reports nausea, constipation, and diarrhea with current pain medication regimen (Oxycodone and MS Contin). Patient expresses desire  to wean off medications and switch to Percocet, which was previously well-tolerated. -Discontinue Oxycodone. -Start Percocet 5/325mg , 1-2 tablets every 6 hours as needed for pain. -Continue MS Contin 60mg , 2 capsules every 12 hours for 2 weeks. -After 2 weeks, decrease MS Contin to 45mg  every 12 hours (using 30mg  and 15mg  tablets). -Plan to further decrease MS Contin to 30mg  every 12 hours after an additional 2 weeks, then to 15mg  every 12 hours, and eventually discontinue. -Check in with patient in 2-4 weeks to assess pain control and tolerance of new regimen. -Refills sent to [facility] pharmacy  -Follow-up appointment in 1 month.  Patient expressed understanding and was in agreement with this plan. She also understands that She can call the clinic at any time with any questions, concerns, or complaints.   Any controlled substances utilized were prescribed in  the context of palliative care. PDMP has been reviewed.   Visit consisted of counseling and education dealing with the complex and emotionally intense issues of symptom management and palliative care in the setting of serious and potentially life-threatening illness.  Willette Alma, AGPCNP-BC  Palliative Medicine Team/[facility]

## 2023-10-13 NOTE — Patient Instructions (Signed)
[facility] - A DEPT OF [facility]  Discharge Instructions: Thank you for choosing [facility] to provide your oncology and hematology care.   If you have a lab appointment with the Cancer Center, please go directly to the Cancer Center and check in at the registration area.   Wear comfortable clothing and clothing appropriate for easy access to any Portacath or PICC line.   We strive to give you quality time with your provider. You may need to reschedule your appointment if you arrive late (15 or more minutes).  Arriving late affects you and other patients whose appointments are after yours.  Also, if you miss three or more appointments without notifying the office, you may be dismissed from the clinic at the provider's discretion.      For prescription refill requests, have your pharmacy contact our office and allow 72 hours for refills to be completed.    Today you received the following chemotherapy and/or immunotherapy agents: Herceptin hylecta      To help prevent nausea and vomiting after your treatment, we encourage you to take your nausea medication as directed.  BELOW ARE SYMPTOMS THAT SHOULD BE REPORTED IMMEDIATELY: *FEVER GREATER THAN 100.4 F (38 C) OR HIGHER *CHILLS OR SWEATING *NAUSEA AND VOMITING THAT IS NOT CONTROLLED WITH YOUR NAUSEA MEDICATION *UNUSUAL SHORTNESS OF BREATH *UNUSUAL BRUISING OR BLEEDING *URINARY PROBLEMS (pain or burning when urinating, or frequent urination) *BOWEL PROBLEMS (unusual diarrhea, constipation, pain near the anus) TENDERNESS IN MOUTH AND THROAT WITH OR WITHOUT PRESENCE OF ULCERS (sore throat, sores in mouth, or a toothache) UNUSUAL RASH, SWELLING OR PAIN  UNUSUAL VAGINAL DISCHARGE OR ITCHING   Items with * indicate a potential emergency and should be followed up as soon as possible or go to the Emergency Department if any problems should occur.  Please show the CHEMOTHERAPY ALERT CARD or  IMMUNOTHERAPY ALERT CARD at check-in to the Emergency Department and triage nurse.  Should you have questions after your visit or need to cancel or reschedule your appointment, please contact [facility] - A DEPT OF [facility]  Dept: [phone]  and follow the prompts.  Office hours are 8:00 a.m. to 4:30 p.m. Monday - Friday. Please note that voicemails left after 4:00 p.m. may not be returned until the following business day.  We are closed weekends and major holidays. You have access to a nurse at all times for urgent questions. Please call the main number to the clinic Dept: [phone] and follow the prompts.   For any non-urgent questions, you may also contact your provider using MyChart. We now offer e-Visits for anyone 2 and older to request care online for non-urgent symptoms. For details visit [email or web address].   Also download the MyChart app! Go to the app store, search "MyChart", open the app, select [place], and log in with your MyChart username and password.

## 2023-10-13 NOTE — Telephone Encounter (Signed)
Yes I will fill. However she mentioned she wanted to wean off of them. I can follow-up. Thanks

## 2023-10-14 ENCOUNTER — Encounter: Payer: Self-pay | Admitting: Hematology and Oncology

## 2023-10-14 ENCOUNTER — Ambulatory Visit (HOSPITAL_COMMUNITY): Payer: No Typology Code available for payment source | Attending: Hematology and Oncology

## 2023-10-14 ENCOUNTER — Encounter (HOSPITAL_COMMUNITY): Payer: Self-pay

## 2023-10-14 ENCOUNTER — Other Ambulatory Visit (HOSPITAL_COMMUNITY): Payer: Self-pay

## 2023-10-22 NOTE — Addendum Note (Signed)
Encounter addended by: Marcello Fennel, PA-C on: 10/22/2023 9:59 AM  Actions taken: Clinical Note Signed, Visit diagnoses modified

## 2023-10-26 ENCOUNTER — Ambulatory Visit
Admission: RE | Admit: 2023-10-26 | Discharge: 2023-10-26 | Disposition: A | Discharge status: Home / Self Care | Payer: No Typology Code available for payment source | Source: Ambulatory Visit | Attending: Radiation Oncology | Admitting: Radiation Oncology

## 2023-10-26 ENCOUNTER — Ambulatory Visit (HOSPITAL_COMMUNITY)
Admission: RE | Admit: 2023-10-26 | Discharge: 2023-10-26 | Disposition: A | Discharge status: Home / Self Care | Payer: No Typology Code available for payment source | Source: Ambulatory Visit | Attending: Hematology and Oncology | Admitting: Hematology and Oncology

## 2023-10-26 DIAGNOSIS — C50212 Malignant neoplasm of upper-inner quadrant of left female breast: Secondary | ICD-10-CM | POA: Insufficient documentation

## 2023-10-26 DIAGNOSIS — C7951 Secondary malignant neoplasm of bone: Secondary | ICD-10-CM | POA: Diagnosis present

## 2023-10-26 DIAGNOSIS — Z170 Estrogen receptor positive status [ER+]: Secondary | ICD-10-CM | POA: Insufficient documentation

## 2023-10-26 MED ORDER — IOHEXOL 300 MG/ML  SOLN
100.0000 mL | Freq: Once | INTRAMUSCULAR | Status: AC | PRN
  Administered 2023-10-26: 100 mL via INTRAVENOUS

## 2023-10-26 NOTE — Progress Notes (Signed)
  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  The patient is scheduled for ongoing care with Dr. [name]  in medical oncology. The patient was encouraged to call if she develops concerns or questions regarding radiation.    Rosaline Minerva, LPN

## 2023-11-01 ENCOUNTER — Encounter: Payer: Self-pay | Admitting: Physician Assistant

## 2023-11-01 ENCOUNTER — Other Ambulatory Visit: Payer: Self-pay | Admitting: Nurse Practitioner

## 2023-11-02 ENCOUNTER — Other Ambulatory Visit: Payer: Self-pay | Admitting: Nurse Practitioner

## 2023-11-02 ENCOUNTER — Other Ambulatory Visit (HOSPITAL_COMMUNITY): Payer: Self-pay

## 2023-11-02 DIAGNOSIS — M62838 Other muscle spasm: Secondary | ICD-10-CM

## 2023-11-02 DIAGNOSIS — Z515 Encounter for palliative care: Secondary | ICD-10-CM

## 2023-11-02 DIAGNOSIS — C7951 Secondary malignant neoplasm of bone: Secondary | ICD-10-CM

## 2023-11-02 DIAGNOSIS — G893 Neoplasm related pain (acute) (chronic): Secondary | ICD-10-CM

## 2023-11-02 DIAGNOSIS — C50212 Malignant neoplasm of upper-inner quadrant of left female breast: Secondary | ICD-10-CM

## 2023-11-02 MED ORDER — MORPHINE SULFATE ER 60 MG PO TBCR
60.0000 mg | EXTENDED_RELEASE_TABLET | Freq: Three times a day (TID) | ORAL | 0 refills | Status: DC
  Filled 2023-11-02: qty 90, 30d supply, fill #0

## 2023-11-02 MED ORDER — OXYCODONE-ACETAMINOPHEN 5-325 MG PO TABS
1.0000 | ORAL_TABLET | ORAL | 0 refills | Status: DC | PRN
  Filled 2023-11-02: qty 90, 8d supply, fill #0

## 2023-11-02 MED ORDER — METHOCARBAMOL 500 MG PO TABS
500.0000 mg | ORAL_TABLET | Freq: Three times a day (TID) | ORAL | 1 refills | Status: DC
  Filled 2023-11-02: qty 90, 30d supply, fill #0
  Filled 2024-02-21: qty 90, 30d supply, fill #1

## 2023-11-03 ENCOUNTER — Encounter: Payer: Self-pay | Admitting: Hematology and Oncology

## 2023-11-04 ENCOUNTER — Ambulatory Visit: Payer: 59

## 2023-11-04 ENCOUNTER — Ambulatory Visit: Payer: 59 | Admitting: Hematology and Oncology

## 2023-11-05 ENCOUNTER — Encounter: Payer: Self-pay | Admitting: Hematology and Oncology

## 2023-11-05 NOTE — Progress Notes (Signed)
Palliative Medicine [facility]  Telephone:(336) [phone] [phone]) [phone]   Name: Natalie Griffin Date: 11/05/2023 MRN: [id number]  DOB: 24-May-1976  Patient Care Team: Serena Croissant, MD as PCP - General (Hematology and Oncology) Donnelly Angelica, RN as Oncology Nurse Navigator Pershing Proud, RN as Oncology Nurse Navigator Rachel Moulds, MD as Consulting Physician (Hematology and Oncology) Pickenpack-Cousar, Arty Baumgartner, NP as Nurse Practitioner (Hospice and Palliative Medicine)    INTERVAL HISTORY: Natalie Griffin is a 48 y.o. 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  Palliative ask to see for symptom management and goals of care.   SOCIAL HISTORY:     reports that she quit smoking about 3 years ago. Her smoking use included cigarettes. She has never used smokeless tobacco. She reports that she does not currently use alcohol. She reports that she does not use drugs.  ADVANCE DIRECTIVES:  Advanced directives on file naming [name] as patient healthcare power of attorney if patient becomes unable to speak for themselves.   CODE STATUS: DNR  PAST MEDICAL HISTORY: Past Medical History:  Diagnosis Date   Anemia    Anxiety    Bone metastasis    Breast cancer (HCC) 2021   Left breast invasive ductal carcinoma   Cancer (HCC) 2021   Left breast   Complication of anesthesia    pseudocholinesterase deficiency   Family history of adverse reaction to anesthesia    Father and Aunt have pseudocholinesterase deficiency - Trouble waking up from anesthesia   History of hiatal hernia    History of kidney stones    noted on CT Left nonobstructive   Pseudocholinesterase deficiency     ALLERGIES:  is allergic to adhesive [tape], fentanyl, phesgo [pertuz-trastuz-hyaluron-zzxf], tetanus toxoid, and succinylcholine.  MEDICATIONS:  Current Outpatient Medications   Medication Sig Dispense Refill   ALPRAZolam (XANAX) 0.5 MG tablet Take 1 tablet (0.5 mg total) by mouth 3 (three) times daily as needed for anxiety. 90 tablet 3   anastrozole (ARIMIDEX) 1 MG tablet Take 1 tablet (1 mg total) by mouth daily. 90 tablet 3   methocarbamol (ROBAXIN) 500 MG tablet Take 1 tablet (500 mg total) by mouth 3 (three) times daily. 90 tablet 1   morphine (MS CONTIN) 60 MG 12 hr tablet Take 1 tablet (60 mg total) by mouth 3 (three) times daily. 90 tablet 0   Multiple Vitamin (ONE DAILY MULTIVITAMIN ADULT PO) Take 1 tablet by mouth daily.     oxyCODONE-acetaminophen (PERCOCET/ROXICET) 5-325 MG tablet Take 1-2 tablets by mouth every 4 (four) hours as needed for severe pain (pain score 7-10). 90 tablet 0   No current facility-administered medications for this visit.    VITAL SIGNS: There were no vitals taken for this visit. There were no vitals filed for this visit.   Estimated body mass index is 23.35 kg/m as calculated from the following:   Height as of 10/13/23: 5\' 5"  (1.651 m).   Weight as of 10/13/23: 140 lb 5 oz (63.6 kg).   PERFORMANCE STATUS (ECOG) : 1 - Symptomatic but completely ambulatory   Physical Exam General: NAD Cardiovascular: regular rate and rhythm Pulmonary: normal breathing pattern  Skin: no rashes Neurological: AAO x3  Discussed the use of AI scribe software for clinical note transcription with the patient, who gave verbal consent to proceed.  IMPRESSION:  I saw [name] during her infusion today. Tolerating treatment  without difficulty. She is a 48 year old female who presents with worsening pain in the left shoulder blade and left superior hip. Denies nausea, vomiting, constipation, or diarrhea. Ongoing fatigue related to her pain. Shares updates from visit with Dr. Pamelia Hoit today with awareness of lesions specific to painful areas. States plans for further evaluation to undergo radiation.   She experiences significant pain in  her left shoulder blade and a new painful spot on her left superior hip. The pain has persisted for a couple of weeks and is described as 'like carrying a kid on your hip for like a couple days straight nonstop.' It has been worsening over the last couple of months, becoming more constant recently. A large lesion is pressing and causing significant discomfort, confirmed by recent imaging. States pain is constant and varies in intensity. Pain previously would decrease when she would rest and increase with activity. It is now more constant regardless of resting or with activity.   We discussed her current pain and regimen at length. She manages her pain with MS Contin 60mg  every 8 hours.  Percocet 5mg /325mg  every 6 hours as needed for breakthrough.  which she cuts in half (pharmacy changed manufacturer and pills size) due to difficulty swallowing and pills feeling as though they are getting stuck. Janisse has a history of radiation treatment in her chest area, which has caused difficulty swallowing due to rawness and possible scar tissue. This issue has improved over time but has not returned to normal. Robaxin 500mg  as needed. Does not take often as she does not feel this does much for her symptoms.   Sheera emotionally expresses previous feelings and hopes of weaning down off some of her pain medications. She reports post radiation pain had improved which allowed her some hope that medication was not needed as often and in higher doses. Unfortunately, this was short lived, and pain increased.   I discussed goals to continue aggressive pain management allowing for some improved quality of life. She understands that pain medication may be a forever part of her daily regimen given severity of her cancer. Emotional support provided. Education provided on maximum use of MS Contin. I approached discussions if pain continues to be uncontrolled consideration of retrying Fentanyl patch may be needed. Ivionna mentions  a past experience with a fentanyl patch that caused her discomfort, possibly due to anxiety and a bad sensation upon waking. We will try to avoid use if possible and consider is absolutely necessary.   Education provided on changes to regimen which includes increasing Percocet to 2-3 tablets every 4 hours as needed for severe breakthrough pain. She will continue MS Contin every 8 hours. She understands we will continue to closely monitor and adjust as needed.   Her social history includes dealing with family estate issues, which she finds emotionally taxing. She limits herself to dealing with these matters three days a week to manage stress. Ms. Kemnitz wants to resolve these issues for financial security and to pass on to her son. We discussed taking things one day at a time while also caring for herself as a priority.   All questions answered and support provided.   Assessment and Plan  Metastatic Cancer Related Pain New/worsening pain in left shoulder blade and right superior hip. Existing pelvic lesion causing increased pain. Plan for radiation therapy/evaluation with Dr. Kathrynn Running per Oncology. Pain not adequately controlled with current regimen. -Increase Percocet to 2-3 tablets every 4 hours as needed for pain. -Continue MS Contin 60mg   every 8 hours.  -Check in 1 week to assess pain control and adjust regimen as needed. -Patient did not tolerate Fentanyl patch as well during previous use. Will try to restrain use until necessary.   Psychosocial Stressors Dealing with estate issues causing emotional distress. -Encouraged to limit dealing with estate issues to 3 days a week for self-care. -Continue to monitor and provide support as needed.  I will plan to follow-up with patient in 1 week. She knows to contact office sooner if needed.  Patient expressed understanding and was in agreement with this plan. She also understands that She can call the clinic at any time with any questions,  concerns, or complaints.   Any controlled substances utilized were prescribed in the context of palliative care. PDMP has been reviewed.   Visit consisted of counseling and education dealing with the complex and emotionally intense issues of symptom management and palliative care in the setting of serious and potentially life-threatening illness.  Willette Alma, AGPCNP-BC  Palliative Medicine Team/[facility]

## 2023-11-08 ENCOUNTER — Telehealth: Payer: Self-pay | Admitting: Hematology and Oncology

## 2023-11-08 ENCOUNTER — Ambulatory Visit: Payer: Self-pay

## 2023-11-08 ENCOUNTER — Ambulatory Visit: Payer: Self-pay | Admitting: Hematology and Oncology

## 2023-11-08 NOTE — Telephone Encounter (Signed)
 Scheduled appointments per WQ. Patient is aware of the made appointments.

## 2023-11-08 NOTE — Assessment & Plan Note (Signed)
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  Patient refused adjuvant chemo, adjuvant radiation and adjuvant antiestrogen therapy.   Patient went to orthopedics for right hip pain.  Imaging revealed bone metastasis.  CT CAP: Cortical destruction of the right femoral neck consistent with skeletal metastases at risk for pathologic fracture.  Lucent lesion in the left iliac bone and L3 vertebral body consistent with multifocal skeletal metastases. 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%, HER2 3+ positive, Ki-67 35%    Treatment plan change: Oophorectomy: 09/30/2022 Current treatment: Anastrozole, Herceptin Hylecta Patient refused switching from Herceptin to Kadcyla (her insurance denied Herceptin Hylecta and patient does not want to receive it in IV form), stopped Herceptin (last given 09/17/2022) Neratinib started 01/05/2023 discontinued 02/08/2023 resumed Herceptin Hylecta 04/20/2023   Pain control: MS Contin and MS IR   Hospitalization 03/24/2023-03/27/2023 increased pain in the back: Vertebral mets and compression fractures: Radiation to lumbar spine completed 04/12/2023   Elevated alkaline phosphatase:  Secondary to bone metastases and hypercalcemia.  She does not want to take Xgeva.    Current treatment: Herceptin Hylecta every 4 weeks CT CAP 05/27/2023: Increase sclerosis of bone metastases. CT angio 06/26/2023: No PE widespread bone metastases.  New subacute pathological fracture Rt sixth and ninth ribs CT CAP 11/05/23: Ext sclerotic and lucent bone lesions, Some more sclerosis, Right iliac bone lesion increasing (3.2 cm)   06/09/2023: Right lumpectomy: Grade 2 IDC 1.9 cm with high-grade DCIS, margins negative, LVI present, ER 90%, PR 60%, HER2 3+ positive, Ki67 35%, RCB class II

## 2023-11-09 ENCOUNTER — Inpatient Hospital Stay
Payer: No Typology Code available for payment source | Attending: Hematology and Oncology | Admitting: Hematology and Oncology

## 2023-11-09 ENCOUNTER — Inpatient Hospital Stay: Payer: No Typology Code available for payment source

## 2023-11-09 ENCOUNTER — Encounter: Payer: Self-pay | Admitting: *Deleted

## 2023-11-09 ENCOUNTER — Inpatient Hospital Stay (HOSPITAL_BASED_OUTPATIENT_CLINIC_OR_DEPARTMENT_OTHER): Payer: No Typology Code available for payment source | Admitting: Nurse Practitioner

## 2023-11-09 ENCOUNTER — Encounter: Payer: Self-pay | Admitting: Hematology and Oncology

## 2023-11-09 ENCOUNTER — Encounter: Payer: Self-pay | Admitting: Nurse Practitioner

## 2023-11-09 VITALS — BP 127/91 | HR 94 | Temp 98.6°F | Resp 18 | Wt 144.8 lb

## 2023-11-09 DIAGNOSIS — R110 Nausea: Secondary | ICD-10-CM | POA: Diagnosis not present

## 2023-11-09 DIAGNOSIS — R630 Anorexia: Secondary | ICD-10-CM

## 2023-11-09 DIAGNOSIS — R530 Neoplastic (malignant) related fatigue: Secondary | ICD-10-CM

## 2023-11-09 DIAGNOSIS — Z515 Encounter for palliative care: Secondary | ICD-10-CM | POA: Insufficient documentation

## 2023-11-09 DIAGNOSIS — C50212 Malignant neoplasm of upper-inner quadrant of left female breast: Secondary | ICD-10-CM | POA: Diagnosis present

## 2023-11-09 DIAGNOSIS — G893 Neoplasm related pain (acute) (chronic): Secondary | ICD-10-CM | POA: Diagnosis not present

## 2023-11-09 DIAGNOSIS — M792 Neuralgia and neuritis, unspecified: Secondary | ICD-10-CM

## 2023-11-09 DIAGNOSIS — C7951 Secondary malignant neoplasm of bone: Secondary | ICD-10-CM | POA: Diagnosis not present

## 2023-11-09 DIAGNOSIS — Z170 Estrogen receptor positive status [ER+]: Secondary | ICD-10-CM

## 2023-11-09 DIAGNOSIS — Z79811 Long term (current) use of aromatase inhibitors: Secondary | ICD-10-CM | POA: Insufficient documentation

## 2023-11-09 DIAGNOSIS — Z5112 Encounter for antineoplastic immunotherapy: Secondary | ICD-10-CM | POA: Diagnosis present

## 2023-11-09 MED ORDER — DENOSUMAB 120 MG/1.7ML ~~LOC~~ SOLN
120.0000 mg | Freq: Once | SUBCUTANEOUS | Status: DC
Start: 2023-11-09 — End: 2023-11-09

## 2023-11-09 MED ORDER — TRASTUZUMAB-HYALURONIDASE-OYSK 600-10000 MG-UNT/5ML ~~LOC~~ SOLN
600.0000 mg | Freq: Once | SUBCUTANEOUS | Status: AC
  Administered 2023-11-09: 600 mg via SUBCUTANEOUS
  Filled 2023-11-09: qty 5

## 2023-11-09 NOTE — Progress Notes (Signed)
Patient Care Team: Serena Croissant, MD as PCP - General (Hematology and Oncology) Donnelly Angelica, RN as Oncology Nurse Navigator Pershing Proud, RN as Oncology Nurse Navigator Rachel Moulds, MD as Consulting Physician (Hematology and Oncology) Pickenpack-Cousar, Arty Baumgartner, NP as Nurse Practitioner [facility] Pall[facility])  DIAGNOSIS:  Encounter Diagnosis  Name Primary?   Malignant neoplasm of upper-inner quadrant of left breast in female, estrogen receptor positive (HCC) Yes    SUMMARY OF ONCOLOGIC HISTORY: Oncology History  Malignant neoplasm of upper-inner quadrant of left breast in female, estrogen receptor positive (HCC)  04/11/2020 Initial Diagnosis   Patient palpated a left breast lump. Mammogram on 03/08/20 showed a 3.0cm mass at the 11 o'clock position with no axillary adenopathy. 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    02/2021 Relapse/Recurrence   Patient went to orthopedics for right hip pain.  Imaging revealed bone metastasis.  CT CAP: Cortical destruction of the right femoral neck consistent with skeletal metastases at risk for pathologic fracture.  Lucent lesion in the left iliac bone and L3 vertebral body consistent with multifocal skeletal metastases.   04/01/2021 Surgery   04/01/2021: Femoral intramedullary nail  Pathology review: Metastatic breast cancer ER 2% PR 0% HER2 3+ positive   04/08/2021 -  Anti-estrogen oral therapy   Tamoxifen 20 mg was prescribed on 04/08/2021 (discontinued by patient because she felt that the risks and  benefits did not support it )--resumed in 07/2022 based on pathology results from right breast biopsy   05/14/2021 -  Chemotherapy   Subcutaneous Herceptin Perjeta (Phesgo) injection started 05/14/2021, switched to Herceptin Hylecta 06/11/2021 (Fixed drug eruption, profound diarrhea to Phesgo)     05/20/2021 - 06/03/2021 Radiation Therapy   Palliative radiation to the iliac bone and hip   02/04/2022 Imaging   MRI thoracic spine 02/04/2022: Numerous bone metastases thoracic spine cervical and lumbar spines largest T5, T8, T11.  Chronic compression fractures T4, T5, T10 and T12    05/07/2022 Imaging   CT chest abdomen pelvis: No new or progressive bone metastases.  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   07/29/2022 PET scan   IMPRESSION: 1. Hypermetabolic mass in the posterior deep RIGHT breast consistent primary breast carcinoma. 2. Central nodal mediastinal metastasis to the high LEFT prevascular space and LEFT super clavicular node. 3. 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  [name] recommended changing treatment to Kadcyla and Neomia wanted to wait until after the holidays.  She will discuss further with Dr. Pamelia Hoit in January.   11/19/2022 - 11/19/2022 Chemotherapy   Patient is on Treatment Plan : BREAST Trastuzumab IV (8/6) or SQ (600) D1 q21d     04/20/2023 -  Chemotherapy   Patient is on Treatment Plan : BREAST MAINTENANCE Trastuzumab IV (6) or SQ (600) D1 q21d x 13 cycles  CHIEF COMPLIANT:   HISTORY OF PRESENT ILLNESS: Discussed the use of AI scribe software for clinical note transcription with the patient, who gave verbal consent to proceed.  History of Present Illness   Natalie Griffin is a 48  year old female with metastatic cancer who presents with neck and pelvic pain.  She has been experiencing significant pain in the right side of her pelvis for two months, which has become constant over the past two weeks. A CT scan revealed a lesion on the right side of her pelvis, near the sacrum, causing increased discomfort. The pain is described as similar to 'carrying a kid on your hip,' particularly severe in the superior part of her pelvis. She has a history of radiation therapy to the right hip following the placement of a rod, which was the first area she received radiation treatment.  She reports neck pain primarily on the left side, persistent for the past month. The pain is described as muscular, sometimes switching to the right side, and worsens with the weight of her wet hair. She has been using Robaxin and topical treatments like Salonpas and [employer], but these have not provided significant relief. The pain is described as 'up and down' and is exacerbated by muscle spasms.  She is concerned about the delay in receiving her Xgeva injection, which she last requested in December. She has not received the injection since then and recalls experiencing jaw pain after previous injections, which was clarified not to be a concern unless surgery was involved.  She has recently adopted a dog named Naina, described as a pit mix with distinctive Rottweiler-like markings. The dog was adopted as an emotional support animal, and she expresses joy in having the dog as a companion.         ALLERGIES:  is allergic to adhesive [tape], fentanyl, phesgo [pertuz-trastuz-hyaluron-zzxf], tetanus toxoid, and succinylcholine.  MEDICATIONS:  Current Outpatient Medications  Medication Sig Dispense Refill   ALPRAZolam (XANAX) 0.5 MG tablet Take 1 tablet (0.5 mg total) by mouth 3 (three) times daily as needed for anxiety. 90 tablet 3   anastrozole (ARIMIDEX) 1 MG tablet Take 1 tablet (1 mg total) by mouth  daily. 90 tablet 3   methocarbamol (ROBAXIN) 500 MG tablet Take 1 tablet (500 mg total) by mouth 3 (three) times daily. 90 tablet 1   morphine (MS CONTIN) 60 MG 12 hr tablet Take 1 tablet (60 mg total) by mouth 3 (three) times daily. 90 tablet 0   Multiple Vitamin (ONE DAILY MULTIVITAMIN ADULT PO) Take 1 tablet by mouth daily.     oxyCODONE-acetaminophen (PERCOCET/ROXICET) 5-325 MG tablet Take 1-2 tablets by mouth every 4 (four) hours as needed for severe pain (pain score 7-10). 90 tablet 0   No current facility-administered medications for this visit.    PHYSICAL EXAMINATION: ECOG PERFORMANCE STATUS: 1 - Symptomatic but completely ambulatory  Vitals:   11/09/23 1412  BP: (!) 127/91  Pulse: 94  Resp: 18  Temp: 98.6 F (37 C)  SpO2: 99%   Filed Weights   11/09/23 1412  Weight: 144 lb 12.8 oz (65.7 kg)    Physical Exam          (exam performed in the presence of a chaperone)  LABORATORY DATA:  I have reviewed the data as listed    Latest Ref Rng & Units 10/01/2023    1:46 PM 09/13/2023   12:06 PM 06/27/2023    7:09 AM  CMP  Glucose 70 -  99 mg/dL 865  98  784   BUN 6 - 20 mg/dL 8  16  13    Creatinine 0.44 - 1.00 mg/dL 6.96  2.95  2.84   Sodium 135 - 145 mmol/L 133  140  137   Potassium 3.5 - 5.1 mmol/L 4.1  4.0  3.4   Chloride 98 - 111 mmol/L 101  106  105   CO2 22 - 32 mmol/L 24  25  19    Calcium 8.9 - 10.3 mg/dL 9.5  13.2  9.3   Total Protein 6.5 - 8.1 g/dL 7.4  7.4  7.0   Total Bilirubin 0.0 - 1.2 mg/dL 0.3  0.3  0.5   Alkaline Phos 38 - 126 U/L 247  249  241   AST 15 - 41 U/L 38  20  16   ALT 0 - 44 U/L 25  8  9      Lab Results  Component Value Date   WBC 3.6 (L) 10/01/2023   HGB 10.4 (L) 10/01/2023   HCT 31.8 (L) 10/01/2023   MCV 84.1 10/01/2023   PLT 178 10/01/2023   NEUTROABS 2.6 10/01/2023    ASSESSMENT & PLAN:  Malignant neoplasm of upper-inner quadrant of left breast in female, estrogen receptor positive (HCC) 06/04/2020:Left lumpectomy ([name]):  invasive and in situ ductal carcinoma, 3.2cm, clear margins, one left axillary lymph node negative for carcinoma.  Patient refused adjuvant chemo, adjuvant radiation and adjuvant antiestrogen therapy.   Patient went to orthopedics for right hip pain.  Imaging revealed bone metastasis.  CT CAP: Cortical destruction of the right femoral neck consistent with skeletal metastases at risk for pathologic fracture.  Lucent lesion in the left iliac bone and L3 vertebral body consistent with multifocal skeletal metastases. 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%, HER2 3+ positive, Ki-67 35%    Treatment plan change: Oophorectomy: 09/30/2022 Current treatment: Anastrozole, Herceptin Hylecta Patient refused switching from Herceptin to Kadcyla (her insurance denied Herceptin Hylecta and patient does not want to receive it in IV form), stopped Herceptin (last given 09/17/2022) Neratinib started 01/05/2023 discontinued 02/08/2023 resumed Herceptin Hylecta 04/20/2023   Pain control: MS Contin and MS IR (palliative care is managing her pain)   Hospitalization 03/24/2023-03/27/2023 increased pain in the back: Vertebral mets and compression fractures: Radiation to lumbar spine completed 04/12/2023 06/09/2023: Right lumpectomy:  Grade 2 IDC 1.9 cm with high-grade DCIS, margins negative, LVI present, ER 90%, PR 60%, HER2 3+ positive, Ki67 35%, RCB class II    Current treatment: Herceptin Hylecta every 4 weeks CT CAP 05/27/2023: Increase sclerosis of bone metastases. CT angio 06/26/2023: No PE widespread bone metastases.  New subacute pathological fracture Rt sixth and ninth ribs CT CAP 11/05/23: Ext sclerotic and lucent bone lesions, Some more sclerosis, Right iliac bone lesion increasing (3.2 cm)    ------------------------------------- Assessment and Plan    Bone Metastases Multiple bony lesions identified, including a  significant lesion in the right iliac bone measuring 3.2 by 1.8 cm. She reports new intermittent pain in the superior pelvis. Radiation therapy could be considered for pain relief if eligible. - Monitor pelvic pain - Administer Xgeva injection today  Pelvic Pain Persistent right pelvic pain with a lesion identified on CT scan, constant for the past two weeks near the sacrum. Previous radiation fields need evaluation to determine eligibility for further radiation due to limits on radiation dosage to a single area. - Consult with Dr. Kathrynn Running regarding radiation therapy for pain relief - Evaluate previous radiation fields for eligibility  Neck Pain Chronic muscular neck pain primarily on the left side, occasionally on the right, persistent for the past month. Limited relief from Robaxin and topical treatments. Pain exacerbated by the weight of her hair, especially when wet. Muscle spasms and contractions are common with bone involvement. - Continue current pain management strategies - Consider alternative pain relief options if current treatments remain ineffective  Pain Management Worsening pain despite attempts to reduce medication. Emphasized the importance of maintaining adequate pain control to improve quality of life. - Discuss pain management strategies with palliative care provider [facility] -  Adjust pain medication as needed  Cancer Management Tumor marker decreased from 2200 to 1700, which is encouraging. Echocardiogram due in one month. - Add tumor marker test to the next set of labs - Request echocardiogram in one month  General Health Maintenance Positive emotional support from her emotional support animal (ESA). - Continue emotional support through ESA  Follow-up - Follow up with Dr. Kathrynn Running regarding radiation therapy - Follow up with palliative care provider [facility] for pain management - Next appointment scheduled for March 10th.          Orders Placed This Encounter  Procedures   CA 27.29    Standing Status:   Standing    Number of Occurrences:   50    Expiration Date:   11/08/2024   ECHOCARDIOGRAM COMPLETE    Standing Status:   Future    Expected Date:   12/07/2023    Expiration Date:   11/08/2024    Where should this test be performed:       Perflutren DEFINITY (image enhancing agent) should be administered unless hypersensitivity or allergy exist:   Administer Perflutren    Reason for exam-Echo:   Chemo  Z09    Release to patient:   Immediate   The patient has a good understanding of the overall plan. she agrees with it. she will call with any problems that may develop before the next visit here. Total time spent: 30 mins including face to face time and time spent for planning, charting and co-ordination of care   Tamsen Meek, MD 11/09/23

## 2023-11-09 NOTE — Progress Notes (Signed)
Per MD okay to treat today for Xgeva with lab from 10/01/23 and calcium showing 9.5.

## 2023-11-09 NOTE — Patient Instructions (Signed)
[facility] - A DEPT OF [facility]  Discharge Instructions: Thank you for choosing [facility] to provide your oncology and hematology care.   If you have a lab appointment with the Cancer Center, please go directly to the Cancer Center and check in at the registration area.   Wear comfortable clothing and clothing appropriate for easy access to any Portacath or PICC line.   We strive to give you quality time with your provider. You may need to reschedule your appointment if you arrive late (15 or more minutes).  Arriving late affects you and other patients whose appointments are after yours.  Also, if you miss three or more appointments without notifying the office, you may be dismissed from the clinic at the provider's discretion.      For prescription refill requests, have your pharmacy contact our office and allow 72 hours for refills to be completed.    Today you received the following chemotherapy and/or immunotherapy agent: Tastuzumab Hyaluronidase.      To help prevent nausea and vomiting after your treatment, we encourage you to take your nausea medication as directed.  BELOW ARE SYMPTOMS THAT SHOULD BE REPORTED IMMEDIATELY: *FEVER GREATER THAN 100.4 F (38 C) OR HIGHER *CHILLS OR SWEATING *NAUSEA AND VOMITING THAT IS NOT CONTROLLED WITH YOUR NAUSEA MEDICATION *UNUSUAL SHORTNESS OF BREATH *UNUSUAL BRUISING OR BLEEDING *URINARY PROBLEMS (pain or burning when urinating, or frequent urination) *BOWEL PROBLEMS (unusual diarrhea, constipation, pain near the anus) TENDERNESS IN MOUTH AND THROAT WITH OR WITHOUT PRESENCE OF ULCERS (sore throat, sores in mouth, or a toothache) UNUSUAL RASH, SWELLING OR PAIN  UNUSUAL VAGINAL DISCHARGE OR ITCHING   Items with * indicate a potential emergency and should be followed up as soon as possible or go to the Emergency Department if any problems should occur.  Please show the CHEMOTHERAPY ALERT CARD or  IMMUNOTHERAPY ALERT CARD at check-in to the Emergency Department and triage nurse.  Should you have questions after your visit or need to cancel or reschedule your appointment, please contact [facility] - A DEPT OF [facility]  Dept: [phone]  and follow the prompts.  Office hours are 8:00 a.m. to 4:30 p.m. Monday - Friday. Please note that voicemails left after 4:00 p.m. may not be returned until the following business day.  We are closed weekends and major holidays. You have access to a nurse at all times for urgent questions. Please call the main number to the clinic Dept: [phone] and follow the prompts.   For any non-urgent questions, you may also contact your provider using MyChart. We now offer e-Visits for anyone 53 and older to request care online for non-urgent symptoms. For details visit [email or web address].   Also download the MyChart app! Go to the app store, search "MyChart", open the app, select [facility], and log in with your MyChart username and password.  Trastuzumab; Hyaluronidase Injection What is this medication? TRASTUZUMAB; HYALURONIDASE (tras TOO zoo mab; hye al ur ON i dase) treats breast cancer. Trastuzumab works by blocking a protein that causes cancer cells to grow and multiply. This helps to slow or stop the spread of cancer cells. Hyaluronidase works by increasing the absorption of other medications in the body to help them work better. It is a combination medication that contains a monoclonal antibody. This medicine may be used for other purposes; ask your health care provider or pharmacist if you  have questions. COMMON BRAND NAME(S): HERCEPTIN HYLECTA What should I tell my care team before I take this medication? They need to know if you have any of these conditions: Heart failure Lung disease An unusual or allergic reaction to trastuzumab, or other medications, foods, dyes, or preservatives Pregnant or trying to get  pregnant Breast-feeding How should I use this medication? This medication is injected under the skin. It is given by your care team in a hospital or clinic setting. Talk to your care team about the use of this medication in children. It is not approved for use in children. Overdosage: If you think you have taken too much of this medicine contact a poison control center or emergency room at once. NOTE: This medicine is only for you. Do not share this medicine with others. What if I miss a dose? Keep appointments for follow-up doses. It is important not to miss your dose. Call your care team if you are unable to keep an appointment. What may interact with this medication? Certain types of chemotherapy, such as daunorubicin, doxorubicin, epirubicin, idarubicin This list may not describe all possible interactions. Give your health care provider a list of all the medicines, herbs, non-prescription drugs, or dietary supplements you use. Also tell them if you smoke, drink alcohol, or use illegal drugs. Some items may interact with your medicine. What should I watch for while using this medication? Your condition will be monitored carefully while you are receiving this medication. This medication may make you feel generally unwell. This is not uncommon as chemotherapy can affect healthy cells as well as cancer cells. Report any side effects. Continue your course of treatment even though you feel ill unless your care team tells you to stop. This medication may increase your risk of getting an infection. Call your care team for advice if you get a fever, chills, sore throat, or other symptoms of a cold or flu. Do not treat yourself. Try to avoid being around people who are sick. Avoid taking medications that contain aspirin, acetaminophen, ibuprofen, naproxen, or ketoprofen unless instructed by your care team. These medications may hide a fever. Talk to your care team if you may be pregnant. Serious birth  defects can occur if you take this medication during pregnancy and for 7 months after the last dose. You will need a negative pregnancy test before starting this medication. Contraception is recommended while taking this medication and for 7 months after the last dose. Your care team can help you find the option that works for you. Do not breastfeed while taking this medication or for 7 months after the last dose. What side effects may I notice from receiving this medication? Side effects that you should report to your care team as soon as possible: Allergic reactions or angioedema--skin rash, itching or hives, swelling of the face, eyes, lips, tongue, arms, or legs, trouble swallowing or breathing Dry cough, shortness of breath or trouble breathing Heart failure--shortness of breath, swelling of the ankles, feet, or hands, sudden weight gain, unusual weakness or fatigue Heart rhythm changes--fast or irregular heartbeat, dizziness, feeling faint or lightheaded, chest pain, trouble breathing Increase in blood pressure Infection--fever, chills, cough, or sore throat Side effects that usually do not require medical attention (report these to your care team if they continue or are bothersome): Diarrhea Hair loss Headache Muscle pain Nausea Unusual weakness or fatigue This list may not describe all possible side effects. Call your doctor for medical advice about side effects. You may  report side effects to FDA at 1-800-FDA-1088. Where should I keep my medication? This medication is given in a hospital or clinic. It will not be stored at home. NOTE: This sheet is a summary. It may not cover all possible information. If you have questions about this medicine, talk to your doctor, pharmacist, or health care provider.  2024 Elsevier/Gold Standard (2022-01-20 00:00:00)

## 2023-11-10 ENCOUNTER — Encounter: Payer: Self-pay | Admitting: Urology

## 2023-11-10 ENCOUNTER — Telehealth: Payer: Self-pay | Admitting: Hematology and Oncology

## 2023-11-10 NOTE — Progress Notes (Signed)
I attempted to call the patient to discuss the potential course of palliative radiation to a progressive, painful metastatic lesion in the right iliac bone.  We were contacted by Dr. Pamelia Hoit to notify us that the patient was having increasing pain and requested that we review her recent imaging to see if she would benefit from palliative radiation.  Dr. Kathrynn Running and I have personally reviewed her imaging and agree that she would benefit from palliative radiation to the lesion in the right iliac bone.    I had to leave a message on her VM this morning requesting she return my call to potentially schedule her for CT simulation at 9 AM or 3 PM on Thursday, 11/11/2023, in anticipation of beginning her daily treatments in the near future.   Marguarite Arbour, MMS, PA-C [place]  [facility] at [facility] Radiation Oncology Physician Assistant Direct Dial: [phone]  Fax: [phone]

## 2023-11-10 NOTE — Telephone Encounter (Signed)
Scheduled appointment per 2/18 secure chat. Patient is aware of the changes made to her upcoming appointment.

## 2023-11-11 ENCOUNTER — Other Ambulatory Visit: Payer: Self-pay | Admitting: *Deleted

## 2023-11-11 ENCOUNTER — Telehealth: Payer: Self-pay | Admitting: *Deleted

## 2023-11-11 DIAGNOSIS — C7951 Secondary malignant neoplasm of bone: Secondary | ICD-10-CM

## 2023-11-11 NOTE — Telephone Encounter (Signed)
Received message from PA team stating Natalie Griffin has been approved.  Appts for tomorrow still standing at this time.  RN attempt x1 to contact pt.  No answer, LVM with appt details.

## 2023-11-11 NOTE — Telephone Encounter (Signed)
Received message from PA team stating pt insurance denied Xgeva.  Due to poor venous access, MD is requesting an appeal.  Message sent to scheduling team to push out injection by 1 week.  RN attempt x1 to contact pt, no answer.  LVM

## 2023-11-12 ENCOUNTER — Inpatient Hospital Stay: Payer: No Typology Code available for payment source

## 2023-11-12 VITALS — BP 132/83 | HR 84 | Temp 98.7°F | Resp 18

## 2023-11-12 DIAGNOSIS — C7951 Secondary malignant neoplasm of bone: Secondary | ICD-10-CM

## 2023-11-12 DIAGNOSIS — Z170 Estrogen receptor positive status [ER+]: Secondary | ICD-10-CM

## 2023-11-12 DIAGNOSIS — Z5112 Encounter for antineoplastic immunotherapy: Secondary | ICD-10-CM | POA: Diagnosis not present

## 2023-11-12 LAB — CMP (CANCER CENTER ONLY)
ALT: 12 U/L (ref 0–44)
AST: 24 U/L (ref 15–41)
Albumin: 4.3 g/dL (ref 3.5–5.0)
Alkaline Phosphatase: 282 U/L — ABNORMAL HIGH (ref 38–126)
Anion gap: 10 (ref 5–15)
BUN: 15 mg/dL (ref 6–20)
CO2: 28 mmol/L (ref 22–32)
Calcium: 10.1 mg/dL (ref 8.9–10.3)
Chloride: 101 mmol/L (ref 98–111)
Creatinine: 0.71 mg/dL (ref 0.44–1.00)
GFR, Estimated: >60 mL/min (ref >60)
Glucose, Bld: 102 mg/dL — ABNORMAL HIGH (ref 70–99)
Potassium: 3.7 mmol/L (ref 3.5–5.1)
Sodium: 139 mmol/L (ref 135–145)
Total Bilirubin: 0.2 mg/dL (ref 0.0–1.2)
Total Protein: 7.4 g/dL (ref 6.5–8.1)

## 2023-11-12 LAB — CBC WITH DIFFERENTIAL (CANCER CENTER ONLY)
Abs Immature Granulocytes: 0.01 10*3/uL (ref 0.00–0.07)
Basophils Absolute: 0 10*3/uL (ref 0.0–0.1)
Basophils Relative: 1 %
Eosinophils Absolute: 0.4 10*3/uL (ref 0.0–0.5)
Eosinophils Relative: 8 %
HCT: 35.3 % — ABNORMAL LOW (ref 36.0–46.0)
Hemoglobin: 11.1 g/dL — ABNORMAL LOW (ref 12.0–15.0)
Immature Granulocytes: 0 %
Lymphocytes Relative: 11 %
Lymphs Abs: 0.6 10*3/uL — ABNORMAL LOW (ref 0.7–4.0)
MCH: 27.7 pg (ref 26.0–34.0)
MCHC: 31.4 g/dL (ref 30.0–36.0)
MCV: 88 fL (ref 80.0–100.0)
Monocytes Absolute: 0.5 10*3/uL (ref 0.1–1.0)
Monocytes Relative: 10 %
Neutro Abs: 3.9 10*3/uL (ref 1.7–7.7)
Neutrophils Relative %: 70 %
Platelet Count: 306 10*3/uL (ref 150–400)
RBC: 4.01 MIL/uL (ref 3.87–5.11)
RDW: 15.9 % — ABNORMAL HIGH (ref 11.5–15.5)
WBC Count: 5.5 10*3/uL (ref 4.0–10.5)
nRBC: 0 % (ref 0.0–0.2)

## 2023-11-12 MED ORDER — DENOSUMAB 120 MG/1.7ML ~~LOC~~ SOLN
120.0000 mg | Freq: Once | SUBCUTANEOUS | Status: AC
  Administered 2023-11-12: 120 mg via SUBCUTANEOUS
  Filled 2023-11-12: qty 1.7

## 2023-11-13 LAB — CANCER ANTIGEN 27.29: CA 27.29: 1547.4 U/mL — ABNORMAL HIGH (ref 0.0–38.6)

## 2023-11-14 ENCOUNTER — Encounter: Payer: Self-pay | Admitting: Hematology and Oncology

## 2023-11-15 ENCOUNTER — Telehealth: Payer: Self-pay | Admitting: Radiation Oncology

## 2023-11-15 NOTE — Telephone Encounter (Signed)
 2/24 Patient called and stated waiting on call to be setup for CT Sim appointment.  Called and spoke to [employer] (Support RTT) transferred call as requested.

## 2023-11-16 ENCOUNTER — Other Ambulatory Visit (HOSPITAL_COMMUNITY): Payer: Self-pay

## 2023-11-16 ENCOUNTER — Encounter (HOSPITAL_COMMUNITY): Payer: Self-pay

## 2023-11-16 ENCOUNTER — Other Ambulatory Visit: Payer: Self-pay | Admitting: Nurse Practitioner

## 2023-11-16 DIAGNOSIS — C7951 Secondary malignant neoplasm of bone: Secondary | ICD-10-CM

## 2023-11-16 DIAGNOSIS — G893 Neoplasm related pain (acute) (chronic): Secondary | ICD-10-CM

## 2023-11-16 DIAGNOSIS — Z515 Encounter for palliative care: Secondary | ICD-10-CM

## 2023-11-16 DIAGNOSIS — C50212 Malignant neoplasm of upper-inner quadrant of left female breast: Secondary | ICD-10-CM

## 2023-11-16 MED ORDER — OXYCODONE-ACETAMINOPHEN 10-325 MG PO TABS
1.0000 | ORAL_TABLET | ORAL | 0 refills | Status: DC | PRN
Start: 2023-11-16 — End: 2023-12-01
  Filled 2023-11-16: qty 90, 15d supply, fill #0

## 2023-11-16 NOTE — Progress Notes (Deleted)
 Palliative Medicine [facility]  Telephone:([phone]) [phone] [phone]) [phone]   Name: Natalie Griffin Date: 11/16/2023 MRN: [id number]  DOB: 27-Feb-1976  Patient Care Team: Serena Croissant, MD as PCP - General (Hematology and Oncology) Donnelly Angelica, RN as Oncology Nurse Navigator Pershing Proud, RN as Oncology Nurse Navigator Rachel Moulds, MD as Consulting Physician (Hematology and Oncology) Pickenpack-Cousar, Arty Baumgartner, NP as Nurse Practitioner (Hospice and Palliative Medicine)    INTERVAL HISTORY: Natalie Griffin is a 48 y.o. 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  Palliative ask to see for symptom management and goals of care.   SOCIAL HISTORY:     reports that she quit smoking about 3 years ago. Her smoking use included cigarettes. She has never used smokeless tobacco. She reports that she does not currently use alcohol. She reports that she does not use drugs.  ADVANCE DIRECTIVES:  Advanced directives on file naming [name] as patient healthcare power of attorney if patient becomes unable to speak for themselves.   CODE STATUS: DNR  PAST MEDICAL HISTORY: Past Medical History:  Diagnosis Date  . Anemia   . Anxiety   . Bone metastasis   . Breast cancer (HCC) 2021   Left breast invasive ductal carcinoma  . Cancer (HCC) 2021   Left breast  . Complication of anesthesia    pseudocholinesterase deficiency  . Family history of adverse reaction to anesthesia    Father and Aunt have pseudocholinesterase deficiency - Trouble waking up from anesthesia  . History of hiatal hernia   . History of kidney stones    noted on CT Left nonobstructive  . Pseudocholinesterase deficiency     ALLERGIES:  is allergic to adhesive [tape], fentanyl, phesgo [pertuz-trastuz-hyaluron-zzxf], tetanus toxoid, and succinylcholine.  MEDICATIONS:  Current Outpatient  Medications  Medication Sig Dispense Refill  . ALPRAZolam (XANAX) 0.5 MG tablet Take 1 tablet (0.5 mg total) by mouth 3 (three) times daily as needed for anxiety. 90 tablet 3  . anastrozole (ARIMIDEX) 1 MG tablet Take 1 tablet (1 mg total) by mouth daily. 90 tablet 3  . methocarbamol (ROBAXIN) 500 MG tablet Take 1 tablet (500 mg total) by mouth 3 (three) times daily. 90 tablet 1  . morphine (MS CONTIN) 60 MG 12 hr tablet Take 1 tablet (60 mg total) by mouth 3 (three) times daily. 90 tablet 0  . Multiple Vitamin (ONE DAILY MULTIVITAMIN ADULT PO) Take 1 tablet by mouth daily.    Marland Kitchen oxyCODONE-acetaminophen (PERCOCET/ROXICET) 5-325 MG tablet Take 1-2 tablets by mouth every 4 (four) hours as needed for severe pain (pain score 7-10). 90 tablet 0   No current facility-administered medications for this visit.    VITAL SIGNS: There were no vitals taken for this visit. There were no vitals filed for this visit.   Estimated body mass index is 24.1 kg/m as calculated from the following:   Height as of 10/13/23: 5\' 5"  (1.651 m).   Weight as of 11/09/23: 144 lb 12.8 oz (65.7 kg).   PERFORMANCE STATUS (ECOG) : 1 - Symptomatic but completely ambulatory   Physical Exam General: NAD Cardiovascular: regular rate and rhythm Pulmonary: normal breathing pattern  Skin: no rashes Neurological: AAO x3  Discussed the use of AI scribe software for clinical note transcription with the patient, who gave verbal consent to proceed.  IMPRESSION:  I saw Natalie Griffin during her infusion today. Tolerating treatment  without difficulty. She is a 48 year old female who presents with worsening pain in the left shoulder blade and left superior hip. Denies nausea, vomiting, constipation, or diarrhea. Ongoing fatigue related to her pain. Shares updates from visit with Dr. Pamelia Hoit today with awareness of lesions specific to painful areas. States plans for further evaluation to undergo radiation.   She experiences  significant pain in her left shoulder blade and a new painful spot on her left superior hip. The pain has persisted for a couple of weeks and is described as 'like carrying a kid on your hip for like a couple days straight nonstop.' It has been worsening over the last couple of months, becoming more constant recently. A large lesion is pressing and causing significant discomfort, confirmed by recent imaging. States pain is constant and varies in intensity. Pain previously would decrease when she would rest and increase with activity. It is now more constant regardless of resting or with activity.   We discussed her current pain and regimen at length. She manages her pain with MS Contin 60mg  every 8 hours.  Percocet 5mg /325mg  every 6 hours as needed for breakthrough.  which she cuts in half (pharmacy changed manufacturer and pills size) due to difficulty swallowing and pills feeling as though they are getting stuck. Sunday has a history of radiation treatment in her chest area, which has caused difficulty swallowing due to rawness and possible scar tissue. This issue has improved over time but has not returned to normal. Robaxin 500mg  as needed. Does not take often as she does not feel this does much for her symptoms.   Natalie Griffin emotionally expresses previous feelings and hopes of weaning down off some of her pain medications. She reports post radiation pain had improved which allowed her some hope that medication was not needed as often and in higher doses. Unfortunately, this was short lived, and pain increased.   I discussed goals to continue aggressive pain management allowing for some improved quality of life. She understands that pain medication may be a forever part of her daily regimen given severity of her cancer. Emotional support provided. Education provided on maximum use of MS Contin. I approached discussions if pain continues to be uncontrolled consideration of retrying Fentanyl patch may be  needed. Natalie Griffin mentions a past experience with a fentanyl patch that caused her discomfort, possibly due to anxiety and a bad sensation upon waking. We will try to avoid use if possible and consider is absolutely necessary.   Education provided on changes to regimen which includes increasing Percocet to 2-3 tablets every 4 hours as needed for severe breakthrough pain. She will continue MS Contin every 8 hours. She understands we will continue to closely monitor and adjust as needed.   Her social history includes dealing with family estate issues, which she finds emotionally taxing. She limits herself to dealing with these matters three days a week to manage stress. Ms. Zee wants to resolve these issues for financial security and to pass on to her son. We discussed taking things one day at a time while also caring for herself as a priority.   All questions answered and support provided.   Assessment and Plan  Metastatic Cancer Related Pain New/worsening pain in left shoulder blade and right superior hip. Existing pelvic lesion causing increased pain. Plan for radiation therapy/evaluation with Dr. Kathrynn Running per Oncology. Pain not adequately controlled with current regimen. -Increase Percocet to 2-3 tablets every 4 hours as needed for pain. -Continue MS Contin 60mg   every 8 hours.  -Check in 1 week to assess pain control and adjust regimen as needed. -Patient did not tolerate Fentanyl patch as well during previous use. Will try to restrain use until necessary.   Psychosocial Stressors Dealing with estate issues causing emotional distress. -Encouraged to limit dealing with estate issues to 3 days a week for self-care. -Continue to monitor and provide support as needed.  I will plan to follow-up with patient in 1 week. She knows to contact office sooner if needed.  Patient expressed understanding and was in agreement with this plan. She also understands that She can call the clinic at any time  with any questions, concerns, or complaints.   Any controlled substances utilized were prescribed in the context of palliative care. PDMP has been reviewed.   Visit consisted of counseling and education dealing with the complex and emotionally intense issues of symptom management and palliative care in the setting of serious and potentially life-threatening illness.  Willette Alma, AGPCNP-BC  Palliative Medicine Team/[facility]

## 2023-11-17 ENCOUNTER — Telehealth: Payer: No Typology Code available for payment source

## 2023-11-18 ENCOUNTER — Ambulatory Visit
Admission: RE | Admit: 2023-11-18 | Discharge: 2023-11-18 | Disposition: A | Discharge status: Home / Self Care | Payer: No Typology Code available for payment source | Source: Ambulatory Visit | Attending: Radiation Oncology | Admitting: Radiation Oncology

## 2023-11-18 DIAGNOSIS — G893 Neoplasm related pain (acute) (chronic): Secondary | ICD-10-CM | POA: Insufficient documentation

## 2023-11-18 DIAGNOSIS — C7951 Secondary malignant neoplasm of bone: Secondary | ICD-10-CM | POA: Insufficient documentation

## 2023-11-18 NOTE — Progress Notes (Signed)
  Radiation Oncology         [phone] ________________________________  Name: CASIMIRA SUTPHIN MRN: [id number]  Date: 11/18/2023  DOB: Aug 05, 1976  SIMULATION AND TREATMENT PLANNING NOTE    ICD-10-CM   1. Pain from bone metastases (HCC)  G89.3    C79.51       DIAGNOSIS:  48 y.o. patient with painful right ilium and inferior pubic ramus metastasis  NARRATIVE:  The patient was brought to the CT Simulation planning suite.  Identity was confirmed.  All relevant records and images related to the planned course of therapy were reviewed.  The patient freely provided informed written consent to proceed with treatment after reviewing the details related to the planned course of therapy. The consent form was witnessed and verified by the simulation staff.  Then, the patient was set-up in a stable reproducible  supine position for radiation therapy.  CT images were obtained.  Surface markings were placed.  The CT images were loaded into the planning software.  Then the target and avoidance structures were contoured.  Treatment planning then occurred.  The radiation prescription was entered and confirmed.  Then, I designed and supervised the construction of a total of 3 medically necessary complex treatment devices consisting of leg positioner and MLC apertures to cover the treated hip area.  I have requested : 3D Simulation  I have requested a DVH of the following structures: Rectum, Bladder, femoral heads and target.  PLAN:  The patient will receive 30 Gy in 10 fractions.  ________________________________  Artist Pais Kathrynn Running, M.D.

## 2023-11-19 DIAGNOSIS — G893 Neoplasm related pain (acute) (chronic): Secondary | ICD-10-CM | POA: Diagnosis not present

## 2023-11-22 ENCOUNTER — Ambulatory Visit
Admission: RE | Admit: 2023-11-22 | Discharge: 2023-11-22 | Disposition: A | Discharge status: Home / Self Care | Payer: No Typology Code available for payment source | Source: Ambulatory Visit | Attending: Radiation Oncology | Admitting: Radiation Oncology

## 2023-11-22 ENCOUNTER — Other Ambulatory Visit: Payer: Self-pay

## 2023-11-22 DIAGNOSIS — Z170 Estrogen receptor positive status [ER+]: Secondary | ICD-10-CM | POA: Diagnosis present

## 2023-11-22 DIAGNOSIS — C50212 Malignant neoplasm of upper-inner quadrant of left female breast: Secondary | ICD-10-CM | POA: Diagnosis present

## 2023-11-22 LAB — RAD ONC ARIA SESSION SUMMARY
Course Elapsed Days: 0
Plan Fractions Treated to Date: 1
Plan Prescribed Dose Per Fraction: 3 Gy
Plan Total Fractions Prescribed: 10
Plan Total Prescribed Dose: 30 Gy
Reference Point Dosage Given to Date: 3 Gy
Reference Point Session Dosage Given: 3 Gy
Session Number: 1

## 2023-11-23 ENCOUNTER — Other Ambulatory Visit: Payer: Self-pay

## 2023-11-23 ENCOUNTER — Ambulatory Visit
Admission: RE | Admit: 2023-11-23 | Discharge: 2023-11-23 | Disposition: A | Discharge status: Home / Self Care | Payer: No Typology Code available for payment source | Source: Ambulatory Visit | Attending: Radiation Oncology

## 2023-11-23 DIAGNOSIS — C50212 Malignant neoplasm of upper-inner quadrant of left female breast: Secondary | ICD-10-CM | POA: Diagnosis not present

## 2023-11-23 LAB — RAD ONC ARIA SESSION SUMMARY
Course Elapsed Days: 1
Plan Fractions Treated to Date: 2
Plan Prescribed Dose Per Fraction: 3 Gy
Plan Total Fractions Prescribed: 10
Plan Total Prescribed Dose: 30 Gy
Reference Point Dosage Given to Date: 6 Gy
Reference Point Session Dosage Given: 3 Gy
Session Number: 2

## 2023-11-24 ENCOUNTER — Ambulatory Visit
Admission: RE | Admit: 2023-11-24 | Discharge: 2023-11-24 | Disposition: A | Discharge status: Home / Self Care | Payer: No Typology Code available for payment source | Source: Ambulatory Visit | Attending: Radiation Oncology | Admitting: Radiation Oncology

## 2023-11-24 ENCOUNTER — Other Ambulatory Visit: Payer: Self-pay

## 2023-11-24 DIAGNOSIS — C50212 Malignant neoplasm of upper-inner quadrant of left female breast: Secondary | ICD-10-CM | POA: Diagnosis not present

## 2023-11-24 LAB — RAD ONC ARIA SESSION SUMMARY
Course Elapsed Days: 2
Plan Fractions Treated to Date: 3
Plan Prescribed Dose Per Fraction: 3 Gy
Plan Total Fractions Prescribed: 10
Plan Total Prescribed Dose: 30 Gy
Reference Point Dosage Given to Date: 9 Gy
Reference Point Session Dosage Given: 3 Gy
Session Number: 3

## 2023-11-25 ENCOUNTER — Other Ambulatory Visit: Payer: Self-pay

## 2023-11-25 ENCOUNTER — Ambulatory Visit
Admission: RE | Admit: 2023-11-25 | Discharge: 2023-11-25 | Disposition: A | Discharge status: Home / Self Care | Payer: No Typology Code available for payment source | Source: Ambulatory Visit | Attending: Radiation Oncology | Admitting: Radiation Oncology

## 2023-11-25 DIAGNOSIS — C50212 Malignant neoplasm of upper-inner quadrant of left female breast: Secondary | ICD-10-CM | POA: Diagnosis not present

## 2023-11-25 LAB — RAD ONC ARIA SESSION SUMMARY
Course Elapsed Days: 3
Plan Fractions Treated to Date: 4
Plan Prescribed Dose Per Fraction: 3 Gy
Plan Total Fractions Prescribed: 10
Plan Total Prescribed Dose: 30 Gy
Reference Point Dosage Given to Date: 12 Gy
Reference Point Session Dosage Given: 3 Gy
Session Number: 4

## 2023-11-26 ENCOUNTER — Other Ambulatory Visit: Payer: Self-pay

## 2023-11-26 ENCOUNTER — Ambulatory Visit
Admission: RE | Admit: 2023-11-26 | Discharge: 2023-11-26 | Disposition: A | Discharge status: Home / Self Care | Payer: No Typology Code available for payment source | Source: Ambulatory Visit | Attending: Radiation Oncology | Admitting: Radiation Oncology

## 2023-11-26 DIAGNOSIS — C50212 Malignant neoplasm of upper-inner quadrant of left female breast: Secondary | ICD-10-CM | POA: Diagnosis not present

## 2023-11-26 LAB — RAD ONC ARIA SESSION SUMMARY
Course Elapsed Days: 4
Plan Fractions Treated to Date: 5
Plan Prescribed Dose Per Fraction: 3 Gy
Plan Total Fractions Prescribed: 10
Plan Total Prescribed Dose: 30 Gy
Reference Point Dosage Given to Date: 15 Gy
Reference Point Session Dosage Given: 3 Gy
Session Number: 5

## 2023-11-29 ENCOUNTER — Other Ambulatory Visit: Payer: Self-pay

## 2023-11-29 ENCOUNTER — Ambulatory Visit
Admission: RE | Admit: 2023-11-29 | Discharge: 2023-11-29 | Disposition: A | Discharge status: Home / Self Care | Payer: No Typology Code available for payment source | Source: Ambulatory Visit | Attending: Radiation Oncology | Admitting: Radiation Oncology

## 2023-11-29 ENCOUNTER — Ambulatory Visit

## 2023-11-29 DIAGNOSIS — C50212 Malignant neoplasm of upper-inner quadrant of left female breast: Secondary | ICD-10-CM | POA: Diagnosis not present

## 2023-11-29 LAB — RAD ONC ARIA SESSION SUMMARY
Course Elapsed Days: 7
Plan Fractions Treated to Date: 6
Plan Prescribed Dose Per Fraction: 3 Gy
Plan Total Fractions Prescribed: 10
Plan Total Prescribed Dose: 30 Gy
Reference Point Dosage Given to Date: 18 Gy
Reference Point Session Dosage Given: 3 Gy
Session Number: 6

## 2023-11-30 ENCOUNTER — Ambulatory Visit (HOSPITAL_BASED_OUTPATIENT_CLINIC_OR_DEPARTMENT_OTHER)
Admission: RE | Admit: 2023-11-30 | Discharge: 2023-11-30 | Disposition: A | Discharge status: Home / Self Care | Payer: No Typology Code available for payment source | Source: Ambulatory Visit | Attending: Hematology and Oncology | Admitting: Hematology and Oncology

## 2023-11-30 ENCOUNTER — Ambulatory Visit
Admission: RE | Admit: 2023-11-30 | Discharge: 2023-11-30 | Disposition: A | Discharge status: Home / Self Care | Payer: No Typology Code available for payment source | Source: Ambulatory Visit | Attending: Radiation Oncology

## 2023-11-30 ENCOUNTER — Other Ambulatory Visit: Payer: Self-pay

## 2023-11-30 DIAGNOSIS — Z0189 Encounter for other specified special examinations: Secondary | ICD-10-CM

## 2023-11-30 DIAGNOSIS — C50212 Malignant neoplasm of upper-inner quadrant of left female breast: Secondary | ICD-10-CM | POA: Insufficient documentation

## 2023-11-30 DIAGNOSIS — Z170 Estrogen receptor positive status [ER+]: Secondary | ICD-10-CM | POA: Insufficient documentation

## 2023-11-30 LAB — RAD ONC ARIA SESSION SUMMARY
Course Elapsed Days: 8
Plan Fractions Treated to Date: 7
Plan Prescribed Dose Per Fraction: 3 Gy
Plan Total Fractions Prescribed: 10
Plan Total Prescribed Dose: 30 Gy
Reference Point Dosage Given to Date: 21 Gy
Reference Point Session Dosage Given: 3 Gy
Session Number: 7

## 2023-11-30 LAB — ECHOCARDIOGRAM COMPLETE
AR max vel: 2.99 cm2
AV Area VTI: 3.22 cm2
AV Area mean vel: 2.79 cm2
AV Mean grad: 3 mmHg
AV Peak grad: 6.5 mmHg
Ao pk vel: 1.27 m/s
Area-P 1/2: 3.39 cm2
Calc EF: 64.9 %
MV VTI: 3.04 cm2
S' Lateral: 2.7 cm
Single Plane A2C EF: 69.1 %
Single Plane A4C EF: 60.7 %

## 2023-11-30 NOTE — Progress Notes (Signed)
  Echocardiogram 2D Echocardiogram has been performed.  [name] RDCS 11/30/2023, 1:53 PM

## 2023-12-01 ENCOUNTER — Other Ambulatory Visit: Payer: Self-pay | Admitting: Nurse Practitioner

## 2023-12-01 ENCOUNTER — Other Ambulatory Visit: Payer: Self-pay

## 2023-12-01 ENCOUNTER — Ambulatory Visit
Admission: RE | Admit: 2023-12-01 | Discharge: 2023-12-01 | Disposition: A | Discharge status: Home / Self Care | Payer: No Typology Code available for payment source | Source: Ambulatory Visit | Attending: Radiation Oncology

## 2023-12-01 DIAGNOSIS — C50212 Malignant neoplasm of upper-inner quadrant of left female breast: Secondary | ICD-10-CM | POA: Diagnosis not present

## 2023-12-01 DIAGNOSIS — C7951 Secondary malignant neoplasm of bone: Secondary | ICD-10-CM

## 2023-12-01 DIAGNOSIS — Z170 Estrogen receptor positive status [ER+]: Secondary | ICD-10-CM

## 2023-12-01 DIAGNOSIS — Z515 Encounter for palliative care: Secondary | ICD-10-CM

## 2023-12-01 DIAGNOSIS — G893 Neoplasm related pain (acute) (chronic): Secondary | ICD-10-CM

## 2023-12-01 LAB — RAD ONC ARIA SESSION SUMMARY
Course Elapsed Days: 9
Plan Fractions Treated to Date: 8
Plan Prescribed Dose Per Fraction: 3 Gy
Plan Total Fractions Prescribed: 10
Plan Total Prescribed Dose: 30 Gy
Reference Point Dosage Given to Date: 24 Gy
Reference Point Session Dosage Given: 3 Gy
Session Number: 8

## 2023-12-02 ENCOUNTER — Ambulatory Visit
Admission: RE | Admit: 2023-12-02 | Discharge: 2023-12-02 | Disposition: A | Discharge status: Home / Self Care | Payer: No Typology Code available for payment source | Source: Ambulatory Visit | Attending: Radiation Oncology | Admitting: Radiation Oncology

## 2023-12-02 ENCOUNTER — Other Ambulatory Visit (HOSPITAL_COMMUNITY): Payer: Self-pay

## 2023-12-02 ENCOUNTER — Other Ambulatory Visit: Payer: Self-pay

## 2023-12-02 DIAGNOSIS — C50212 Malignant neoplasm of upper-inner quadrant of left female breast: Secondary | ICD-10-CM | POA: Diagnosis not present

## 2023-12-02 LAB — RAD ONC ARIA SESSION SUMMARY
Course Elapsed Days: 10
Plan Fractions Treated to Date: 9
Plan Prescribed Dose Per Fraction: 3 Gy
Plan Total Fractions Prescribed: 10
Plan Total Prescribed Dose: 30 Gy
Reference Point Dosage Given to Date: 27 Gy
Reference Point Session Dosage Given: 3 Gy
Session Number: 9

## 2023-12-02 MED ORDER — OXYCODONE-ACETAMINOPHEN 10-325 MG PO TABS
1.0000 | ORAL_TABLET | ORAL | 0 refills | Status: DC | PRN
  Filled 2023-12-02: qty 90, 15d supply, fill #0

## 2023-12-02 MED ORDER — MORPHINE SULFATE ER 60 MG PO TBCR
60.0000 mg | EXTENDED_RELEASE_TABLET | Freq: Three times a day (TID) | ORAL | 0 refills | Status: DC
  Filled 2023-12-02: qty 90, 30d supply, fill #0

## 2023-12-03 ENCOUNTER — Ambulatory Visit
Admission: RE | Admit: 2023-12-03 | Discharge: 2023-12-03 | Disposition: A | Discharge status: Home / Self Care | Source: Ambulatory Visit | Attending: Radiation Oncology | Admitting: Radiation Oncology

## 2023-12-03 ENCOUNTER — Ambulatory Visit
Admission: RE | Admit: 2023-12-03 | Discharge: 2023-12-03 | Disposition: A | Discharge status: Home / Self Care | Payer: No Typology Code available for payment source | Source: Ambulatory Visit | Attending: Radiation Oncology | Admitting: Radiation Oncology

## 2023-12-03 ENCOUNTER — Other Ambulatory Visit: Payer: Self-pay

## 2023-12-03 DIAGNOSIS — C50212 Malignant neoplasm of upper-inner quadrant of left female breast: Secondary | ICD-10-CM | POA: Diagnosis not present

## 2023-12-03 LAB — RAD ONC ARIA SESSION SUMMARY
Course Elapsed Days: 11
Plan Fractions Treated to Date: 10
Plan Prescribed Dose Per Fraction: 3 Gy
Plan Total Fractions Prescribed: 10
Plan Total Prescribed Dose: 30 Gy
Reference Point Dosage Given to Date: 30 Gy
Reference Point Session Dosage Given: 3 Gy
Session Number: 10

## 2023-12-03 NOTE — Progress Notes (Signed)
 Palliative Medicine [facility]  Telephone:(336) [phone] [phone]) [phone]   Name: Natalie Griffin Date: 12/03/2023 MRN: [id number]  DOB: 17-Feb-1976  Patient Care Team: Serena Croissant, MD as PCP - General (Hematology and Oncology) Donnelly Angelica, RN as Oncology Nurse Navigator Pershing Proud, RN as Oncology Nurse Navigator Rachel Moulds, MD as Consulting Physician (Hematology and Oncology) Pickenpack-Cousar, Arty Baumgartner, NP as Nurse Practitioner (Hospice and Palliative Medicine)    INTERVAL HISTORY: Natalie Griffin is a 48 y.o. 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  Palliative ask to see for symptom management and goals of care.   SOCIAL HISTORY:     reports that she quit smoking about 3 years ago. Her smoking use included cigarettes. She has never used smokeless tobacco. She reports that she does not currently use alcohol. She reports that she does not use drugs.  ADVANCE DIRECTIVES:  Advanced directives on file naming Natalie Griffin as patient healthcare power of attorney if patient becomes unable to speak for themselves.   CODE STATUS: DNR  PAST MEDICAL HISTORY: Past Medical History:  Diagnosis Date   Anemia    Anxiety    Bone metastasis    Breast cancer (HCC) 2021   Left breast invasive ductal carcinoma   Cancer (HCC) 2021   Left breast   Complication of anesthesia    pseudocholinesterase deficiency   Family history of adverse reaction to anesthesia    Father and Aunt have pseudocholinesterase deficiency - Trouble waking up from anesthesia   History of hiatal hernia    History of kidney stones    noted on CT Left nonobstructive   Pseudocholinesterase deficiency     ALLERGIES:  is allergic to adhesive [tape], fentanyl, phesgo [pertuz-trastuz-hyaluron-zzxf], tetanus toxoid, and succinylcholine.  MEDICATIONS:  Current Outpatient Medications   Medication Sig Dispense Refill   ALPRAZolam (XANAX) 0.5 MG tablet Take 1 tablet (0.5 mg total) by mouth 3 (three) times daily as needed for anxiety. 90 tablet 3   anastrozole (ARIMIDEX) 1 MG tablet Take 1 tablet (1 mg total) by mouth daily. 90 tablet 3   methocarbamol (ROBAXIN) 500 MG tablet Take 1 tablet (500 mg total) by mouth 3 (three) times daily. 90 tablet 1   morphine (MS CONTIN) 60 MG 12 hr tablet Take 1 tablet (60 mg total) by mouth 3 (three) times daily. 90 tablet 0   Multiple Vitamin (ONE DAILY MULTIVITAMIN ADULT PO) Take 1 tablet by mouth daily.     oxyCODONE-acetaminophen (PERCOCET) 10-325 MG tablet Take 1 tablet by mouth every 4 (four) hours as needed for pain. 90 tablet 0   No current facility-administered medications for this visit.    VITAL SIGNS: There were no vitals taken for this visit. There were no vitals filed for this visit.   Estimated body mass index is 24.1 kg/m as calculated from the following:   Height as of 10/13/23: 5\' 5"  (1.651 m).   Weight as of 11/09/23: 144 lb 12.8 oz (65.7 kg).   PERFORMANCE STATUS (ECOG) : 1 - Symptomatic but completely ambulatory   Physical Exam General: NAD Cardiovascular: regular rate and rhythm Pulmonary: normal breathing pattern  Skin: no rashes Neurological: AAO x3  Discussed the use of AI scribe software for clinical note transcription with the patient, who gave verbal consent to proceed.  IMPRESSION:  I saw Ms. Natalie Griffin during her infusion today for symptom management follow-up. Tolerating treatment  without difficulty. She is experiencing significant weakness/fatigue, which she attributes to her ongoing radiation treatment. The onset of extreme fatigue began around the middle of last week, leading to increased rest and sleep. Despite not being someone who typically stays in bed for long periods, she has been sleeping more than usual. This is slowly improving. Denies concerns of nausea or vomiting. Appetite is  good despite having loose stools. She plans to travel to [place] to visit family once her diarrhea is under control, intending to stay for two to three weeks.   Natalie Griffin has been experiencing bouts of diarrhea since Friday, with the last episode occurring around noon today. She has taken two doses of Imodium, the first at 5 AM and the second between 10:30 AM and noon, which seems to have helped as she has not had any diarrhea since noon. From 5 AM to noon, she estimates having diarrhea 8 to 10 times. Some sacral discomfort from loose stools. States she is using wet wipes for comfort. Also encouraged use of Desitin cream for skin supportive barrier. Education provided on use of Lomotil which has already been sent in to her pharmacy.   We discussed her pain at length. Natalie Griffin reports pain is much better controlled. Pain has decreased with radiation treatments which she is appreciative of.  The decrease in her pain has allowed her to reduce her pain medication from every four hours to every six hours as needed with breakthrough medication and decrease MS Contin down to every 12 hours from 8 hours. Previously, she was unable to move in bed due to lower pelvic pain, but this has improved, and she can now move more easily. She mentions that the pain relief has helped her feel more stable when walking, reducing the need for a cane. No adjustments to her regimen at this time. We will plan to continue close follow-up.  Assessment and Plan Diarrhea  Acute diarrhea likely due to radiation treatment. Imodium effective, no episodes since noon. - Use Lomotil as needed for uncontrolled diarrhea.  Fatigue Significant fatigue likely secondary to radiation treatment. Increased rest noted. Slowly improving.  - Encourage rest and continuation of supplements and vitamins.  Pain management Pain controlled with current medication regimen. Adjusted dosing improved stability and mobility. Much improved with radiation.   - Continue current pain management regimen. -Percocet to 2-3 tablets every 6 hours as needed for pain (increased from every 4 hours) -MS Contin 60mg  every 12 hours. (decreased from every 8 hours)  Travel plans Plans to travel to [place]. Advised to delay travel until diarrhea resolves to prevent complications. - Advise to delay travel until diarrhea is resolved. - Encourage communication if any medical needs arise during travel.  I will plan to see patient back in 6-8 weeks. Sooner if needed.  Patient expressed understanding and was in agreement with this plan. She also understands that She can call the clinic at any time with any questions, concerns, or complaints.   Any controlled substances utilized were prescribed in the context of palliative care. PDMP has been reviewed.   Visit consisted of counseling and education dealing with the complex and emotionally intense issues of symptom management and palliative care in the setting of serious and potentially life-threatening illness.  Willette Alma, AGPCNP-BC  Palliative Medicine Team/[facility]

## 2023-12-06 ENCOUNTER — Inpatient Hospital Stay: Payer: No Typology Code available for payment source | Attending: Hematology and Oncology

## 2023-12-06 ENCOUNTER — Inpatient Hospital Stay (HOSPITAL_BASED_OUTPATIENT_CLINIC_OR_DEPARTMENT_OTHER): Payer: No Typology Code available for payment source | Admitting: Nurse Practitioner

## 2023-12-06 ENCOUNTER — Other Ambulatory Visit: Payer: Self-pay | Admitting: Hematology and Oncology

## 2023-12-06 ENCOUNTER — Encounter: Payer: Self-pay | Admitting: Nurse Practitioner

## 2023-12-06 ENCOUNTER — Other Ambulatory Visit (HOSPITAL_COMMUNITY): Payer: Self-pay

## 2023-12-06 VITALS — BP 107/69 | HR 62 | Temp 98.3°F | Resp 16

## 2023-12-06 DIAGNOSIS — C50212 Malignant neoplasm of upper-inner quadrant of left female breast: Secondary | ICD-10-CM | POA: Insufficient documentation

## 2023-12-06 DIAGNOSIS — R197 Diarrhea, unspecified: Secondary | ICD-10-CM

## 2023-12-06 DIAGNOSIS — R530 Neoplastic (malignant) related fatigue: Secondary | ICD-10-CM | POA: Diagnosis not present

## 2023-12-06 DIAGNOSIS — G893 Neoplasm related pain (acute) (chronic): Secondary | ICD-10-CM | POA: Diagnosis not present

## 2023-12-06 DIAGNOSIS — Z170 Estrogen receptor positive status [ER+]: Secondary | ICD-10-CM

## 2023-12-06 DIAGNOSIS — C7951 Secondary malignant neoplasm of bone: Secondary | ICD-10-CM | POA: Insufficient documentation

## 2023-12-06 DIAGNOSIS — Z515 Encounter for palliative care: Secondary | ICD-10-CM | POA: Diagnosis not present

## 2023-12-06 DIAGNOSIS — R52 Pain, unspecified: Secondary | ICD-10-CM | POA: Insufficient documentation

## 2023-12-06 DIAGNOSIS — R5383 Other fatigue: Secondary | ICD-10-CM | POA: Insufficient documentation

## 2023-12-06 DIAGNOSIS — Z5112 Encounter for antineoplastic immunotherapy: Secondary | ICD-10-CM | POA: Diagnosis present

## 2023-12-06 MED ORDER — SODIUM CHLORIDE 0.9 % IV SOLN: Freq: Once | INTRAVENOUS | Status: DC

## 2023-12-06 MED ORDER — TRASTUZUMAB-HYALURONIDASE-OYSK 600-10000 MG-UNT/5ML ~~LOC~~ SOLN
600.0000 mg | Freq: Once | SUBCUTANEOUS | Status: AC
  Administered 2023-12-06: 600 mg via SUBCUTANEOUS
  Filled 2023-12-06: qty 5

## 2023-12-06 MED ORDER — DIPHENOXYLATE-ATROPINE 2.5-0.025 MG PO TABS
1.0000 | ORAL_TABLET | Freq: Four times a day (QID) | ORAL | 2 refills | Status: DC | PRN
  Filled 2023-12-06: qty 30, 8d supply, fill #0

## 2023-12-06 NOTE — Progress Notes (Signed)
 Patient had an episode last evening of barely controlled diarrhea. She took two doses of imodium at 8pm and in the morning at 5am. She had "too many BMs to count". Dr. Pamelia Hoit aware- patient has been hydrating. No nausea involved. VSS. Prescription for Lomotil sent to [facility] by MD. Patient aware. Ok to treat today.

## 2023-12-06 NOTE — Patient Instructions (Signed)
 [facility] - A DEPT OF [facility]  Discharge Instructions: Thank you for choosing [facility] to provide your oncology and hematology care.   If you have a lab appointment with the Cancer Center, please go directly to the Cancer Center and check in at the registration area.   Wear comfortable clothing and clothing appropriate for easy access to any Portacath or PICC line.   We strive to give you quality time with your provider. You may need to reschedule your appointment if you arrive late (15 or more minutes).  Arriving late affects you and other patients whose appointments are after yours.  Also, if you miss three or more appointments without notifying the office, you may be dismissed from the clinic at the provider's discretion.      For prescription refill requests, have your pharmacy contact our office and allow 72 hours for refills to be completed.    Today you received the following chemotherapy and/or immunotherapy agent: Trastuzumab Hyaluronidase.      To help prevent nausea and vomiting after your treatment, we encourage you to take your nausea medication as directed.  BELOW ARE SYMPTOMS THAT SHOULD BE REPORTED IMMEDIATELY: *FEVER GREATER THAN 100.4 F (38 C) OR HIGHER *CHILLS OR SWEATING *NAUSEA AND VOMITING THAT IS NOT CONTROLLED WITH YOUR NAUSEA MEDICATION *UNUSUAL SHORTNESS OF BREATH *UNUSUAL BRUISING OR BLEEDING *URINARY PROBLEMS (pain or burning when urinating, or frequent urination) *BOWEL PROBLEMS (unusual diarrhea, constipation, pain near the anus) TENDERNESS IN MOUTH AND THROAT WITH OR WITHOUT PRESENCE OF ULCERS (sore throat, sores in mouth, or a toothache) UNUSUAL RASH, SWELLING OR PAIN  UNUSUAL VAGINAL DISCHARGE OR ITCHING   Items with * indicate a potential emergency and should be followed up as soon as possible or go to the Emergency Department if any problems should occur.  Please show the CHEMOTHERAPY ALERT CARD  or IMMUNOTHERAPY ALERT CARD at check-in to the Emergency Department and triage nurse.  Should you have questions after your visit or need to cancel or reschedule your appointment, please contact [facility] - A DEPT OF [facility]  Dept: [phone]  and follow the prompts.  Office hours are 8:00 a.m. to 4:30 p.m. Monday - Friday. Please note that voicemails left after 4:00 p.m. may not be returned until the following business day.  We are closed weekends and major holidays. You have access to a nurse at all times for urgent questions. Please call the main number to the clinic Dept: [phone] and follow the prompts.   For any non-urgent questions, you may also contact your provider using MyChart. We now offer e-Visits for anyone 24 and older to request care online for non-urgent symptoms. For details visit [email or web address].   Also download the MyChart app! Go to the app store, search "MyChart", open the app, select [place], and log in with your MyChart username and password.  Trastuzumab; Hyaluronidase Injection What is this medication? TRASTUZUMAB; HYALURONIDASE (tras TOO zoo mab; hye al ur ON i dase) treats breast cancer. Trastuzumab works by blocking a protein that causes cancer cells to grow and multiply. This helps to slow or stop the spread of cancer cells. Hyaluronidase works by increasing the absorption of other medications in the body to help them work better. It is a combination medication that contains a monoclonal antibody. This medicine may be used for other purposes; ask your health care provider or pharmacist if you  have questions. COMMON BRAND NAME(S): HERCEPTIN HYLECTA What should I tell my care team before I take this medication? They need to know if you have any of these conditions: Heart failure Lung disease An unusual or allergic reaction to trastuzumab, or other medications, foods, dyes, or preservatives Pregnant or trying to get  pregnant Breast-feeding How should I use this medication? This medication is injected under the skin. It is given by your care team in a hospital or clinic setting. Talk to your care team about the use of this medication in children. It is not approved for use in children. Overdosage: If you think you have taken too much of this medicine contact a poison control center or emergency room at once. NOTE: This medicine is only for you. Do not share this medicine with others. What if I miss a dose? Keep appointments for follow-up doses. It is important not to miss your dose. Call your care team if you are unable to keep an appointment. What may interact with this medication? Certain types of chemotherapy, such as daunorubicin, doxorubicin, epirubicin, idarubicin This list may not describe all possible interactions. Give your health care provider a list of all the medicines, herbs, non-prescription drugs, or dietary supplements you use. Also tell them if you smoke, drink alcohol, or use illegal drugs. Some items may interact with your medicine. What should I watch for while using this medication? Your condition will be monitored carefully while you are receiving this medication. This medication may make you feel generally unwell. This is not uncommon as chemotherapy can affect healthy cells as well as cancer cells. Report any side effects. Continue your course of treatment even though you feel ill unless your care team tells you to stop. This medication may increase your risk of getting an infection. Call your care team for advice if you get a fever, chills, sore throat, or other symptoms of a cold or flu. Do not treat yourself. Try to avoid being around people who are sick. Avoid taking medications that contain aspirin, acetaminophen, ibuprofen, naproxen, or ketoprofen unless instructed by your care team. These medications may hide a fever. Talk to your care team if you may be pregnant. Serious birth  defects can occur if you take this medication during pregnancy and for 7 months after the last dose. You will need a negative pregnancy test before starting this medication. Contraception is recommended while taking this medication and for 7 months after the last dose. Your care team can help you find the option that works for you. Do not breastfeed while taking this medication or for 7 months after the last dose. What side effects may I notice from receiving this medication? Side effects that you should report to your care team as soon as possible: Allergic reactions or angioedema--skin rash, itching or hives, swelling of the face, eyes, lips, tongue, arms, or legs, trouble swallowing or breathing Dry cough, shortness of breath or trouble breathing Heart failure--shortness of breath, swelling of the ankles, feet, or hands, sudden weight gain, unusual weakness or fatigue Heart rhythm changes--fast or irregular heartbeat, dizziness, feeling faint or lightheaded, chest pain, trouble breathing Increase in blood pressure Infection--fever, chills, cough, or sore throat Side effects that usually do not require medical attention (report these to your care team if they continue or are bothersome): Diarrhea Hair loss Headache Muscle pain Nausea Unusual weakness or fatigue This list may not describe all possible side effects. Call your doctor for medical advice about side effects. You may  report side effects to FDA at 1-800-FDA-1088. Where should I keep my medication? This medication is given in a hospital or clinic. It will not be stored at home. NOTE: This sheet is a summary. It may not cover all possible information. If you have questions about this medicine, talk to your doctor, pharmacist, or health care provider.  2024 Elsevier/Gold Standard (2022-01-20 00:00:00)

## 2023-12-06 NOTE — Progress Notes (Signed)
 Per MD, OK to treat today with EF 50-55%. Message sent to Jonny Ruiz, PharmD to update tx plan with EF parameters.

## 2023-12-06 NOTE — Radiation Completion Notes (Addendum)
  Radiation Oncology         [phone] ________________________________  Name: Natalie Griffin MRN: [id number]  Date: 12/03/2023  DOB: June 24, 1976  Referring Physician: Cameron Cea, M.D. Date of Service: 2023-12-06 Radiation Oncologist: Bartholome Ligas, M.D. [facility] [place]     RADIATION ONCOLOGY END OF TREATMENT NOTE     Diagnosis:47 y.o. patient with painful right ilium and inferior pubic ramus metastasis   Intent: Palliative     ==========DELIVERED PLANS==========  First Treatment Date: 2023-11-22 Last Treatment Date: 2023-12-03   Plan Name: Pelvis_R Site: Hip, Right Technique: 3D Mode: Photon Dose Per Fraction: 3 Gy Prescribed Dose (Delivered / Prescribed): 30 Gy / 30 Gy Prescribed Fxs (Delivered / Prescribed): 10 / 10     ==========ON TREATMENT VISIT DATES========== 2023-11-29, 2023-12-03    See weekly On Treatment Notes in Epic for details in the Media tab (listed as Progress notes on the On Treatment Visit Dates listed above).  She tolerated the radiation treatments relatively well with only modest fatigue.  The patient will receive a call in about one month from the radiation oncology department. She will continue follow up with her medical oncologist, Dr. [name], as well.  ------------------------------------------------   Kenith Payer, MD [facility]  Radiation Oncology Direct Dial: [phone]  Fax: [phone] [email or web address]  Skype  LinkedIn

## 2023-12-29 ENCOUNTER — Other Ambulatory Visit: Payer: Self-pay | Admitting: Nurse Practitioner

## 2023-12-29 DIAGNOSIS — G893 Neoplasm related pain (acute) (chronic): Secondary | ICD-10-CM

## 2023-12-29 DIAGNOSIS — C7951 Secondary malignant neoplasm of bone: Secondary | ICD-10-CM

## 2023-12-29 DIAGNOSIS — Z515 Encounter for palliative care: Secondary | ICD-10-CM

## 2023-12-29 MED ORDER — OXYCODONE-ACETAMINOPHEN 10-325 MG PO TABS
1.0000 | ORAL_TABLET | ORAL | 0 refills | Status: DC | PRN
Start: 2023-12-29 — End: 2023-12-30

## 2023-12-30 ENCOUNTER — Other Ambulatory Visit: Payer: Self-pay | Admitting: Nurse Practitioner

## 2023-12-30 DIAGNOSIS — Z515 Encounter for palliative care: Secondary | ICD-10-CM

## 2023-12-30 DIAGNOSIS — C7951 Secondary malignant neoplasm of bone: Secondary | ICD-10-CM

## 2023-12-30 DIAGNOSIS — G893 Neoplasm related pain (acute) (chronic): Secondary | ICD-10-CM

## 2023-12-30 MED ORDER — OXYCODONE-ACETAMINOPHEN 10-325 MG PO TABS: 1.0000 | ORAL_TABLET | ORAL | 0 refills | Status: DC | PRN

## 2023-12-31 NOTE — Progress Notes (Addendum)
  Radiation Oncology         [phone] ________________________________  Name: Natalie Griffin MRN: [id number]  Date of Service: 12/31/2023  DOB: 1975/10/03  Post Treatment Telephone Note  Diagnosis:  C79.51 Secondary malignant neoplasm of bone (as documented in provider EOT note)   The patient was available for call today.  The patient did not note fatigue during radiation. The patient did not note skin changes in the field of radiation during therapy. The patient has noticed improvement in pain in the area(s) treated with radiation. The patient is not taking dexamethasone. The patient does not have symptoms of  weakness or loss of control of the extremities. The patient does not have symptoms of headache. The patient does not have symptoms of seizure or uncontrolled movement. The patient does not have symptoms of changes in vision. The patient does not have changes in speech. The patient does not have confusion.   The patient is scheduled for ongoing care with Dr. Pamelia Hoit  in medical oncology. The patient was encouraged to call if she develops concerns or questions regarding radiation.   This concludes the interaction.  Ruel Favors, LPN

## 2024-01-03 ENCOUNTER — Inpatient Hospital Stay: Payer: No Typology Code available for payment source | Admitting: Nurse Practitioner

## 2024-01-03 ENCOUNTER — Inpatient Hospital Stay: Payer: No Typology Code available for payment source

## 2024-01-03 ENCOUNTER — Inpatient Hospital Stay: Payer: No Typology Code available for payment source | Admitting: Hematology and Oncology

## 2024-01-03 ENCOUNTER — Ambulatory Visit: Payer: No Typology Code available for payment source

## 2024-01-03 ENCOUNTER — Encounter: Payer: Self-pay | Admitting: Hematology and Oncology

## 2024-01-03 NOTE — Assessment & Plan Note (Deleted)
 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  Patient refused adjuvant chemo, adjuvant radiation and adjuvant antiestrogen therapy.   Patient went to orthopedics for right hip pain.  Imaging revealed bone metastasis.  CT CAP: Cortical destruction of the right femoral neck consistent with skeletal metastases at risk for pathologic fracture.  Lucent lesion in the left iliac bone and L3 vertebral body consistent with multifocal skeletal metastases. 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%, HER2 3+ positive, Ki-67 35%    Treatment plan change: Oophorectomy: 09/30/2022 Current treatment: Anastrozole, Herceptin Hylecta Patient refused switching from Herceptin to Kadcyla (her insurance denied Herceptin Hylecta and patient does not want to receive it in IV form), stopped Herceptin (last given 09/17/2022) Neratinib started 01/05/2023 discontinued 02/08/2023 resumed Herceptin Hylecta 04/20/2023   Pain control: MS Contin and MS IR (palliative care is managing her pain)   Hospitalization 03/24/2023-03/27/2023 increased pain in the back: Vertebral mets and compression fractures: Radiation to lumbar spine completed  04/12/2023 06/09/2023: Right lumpectomy: 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 CT angio 06/26/2023: No PE widespread bone metastases.  New subacute pathological fracture Rt sixth and ninth ribs CT CAP 11/05/23: Ext sclerotic and lucent bone lesions, Some more sclerosis, Right iliac bone lesion increasing (3.2 cm)  Tumor marker: 11/12/2023: 1547 (was 1733 about 3 months ago)

## 2024-01-04 ENCOUNTER — Ambulatory Visit
Admission: RE | Admit: 2024-01-04 | Discharge: 2024-01-04 | Disposition: A | Discharge status: Home / Self Care | Source: Ambulatory Visit | Attending: Hematology and Oncology | Admitting: Hematology and Oncology

## 2024-01-10 ENCOUNTER — Inpatient Hospital Stay (HOSPITAL_BASED_OUTPATIENT_CLINIC_OR_DEPARTMENT_OTHER): Admitting: Nurse Practitioner

## 2024-01-10 ENCOUNTER — Inpatient Hospital Stay (HOSPITAL_BASED_OUTPATIENT_CLINIC_OR_DEPARTMENT_OTHER): Admitting: Adult Health

## 2024-01-10 ENCOUNTER — Other Ambulatory Visit (HOSPITAL_COMMUNITY): Payer: Self-pay

## 2024-01-10 ENCOUNTER — Encounter: Payer: Self-pay | Admitting: Adult Health

## 2024-01-10 ENCOUNTER — Other Ambulatory Visit: Payer: Self-pay

## 2024-01-10 ENCOUNTER — Inpatient Hospital Stay: Attending: Hematology and Oncology

## 2024-01-10 ENCOUNTER — Encounter: Payer: Self-pay | Admitting: Nurse Practitioner

## 2024-01-10 VITALS — BP 109/56 | HR 73 | Temp 98.0°F | Resp 16 | Wt 141.5 lb

## 2024-01-10 DIAGNOSIS — Z170 Estrogen receptor positive status [ER+]: Secondary | ICD-10-CM | POA: Insufficient documentation

## 2024-01-10 DIAGNOSIS — R530 Neoplastic (malignant) related fatigue: Secondary | ICD-10-CM

## 2024-01-10 DIAGNOSIS — C50212 Malignant neoplasm of upper-inner quadrant of left female breast: Secondary | ICD-10-CM | POA: Insufficient documentation

## 2024-01-10 DIAGNOSIS — G893 Neoplasm related pain (acute) (chronic): Secondary | ICD-10-CM

## 2024-01-10 DIAGNOSIS — Z515 Encounter for palliative care: Secondary | ICD-10-CM

## 2024-01-10 DIAGNOSIS — Z5112 Encounter for antineoplastic immunotherapy: Secondary | ICD-10-CM | POA: Insufficient documentation

## 2024-01-10 DIAGNOSIS — C7951 Secondary malignant neoplasm of bone: Secondary | ICD-10-CM

## 2024-01-10 MED ORDER — TRASTUZUMAB-HYALURONIDASE-OYSK 600-10000 MG-UNT/5ML ~~LOC~~ SOLN
600.0000 mg | Freq: Once | SUBCUTANEOUS | Status: AC
  Administered 2024-01-10: 600 mg via SUBCUTANEOUS
  Filled 2024-01-10: qty 5

## 2024-01-10 MED ORDER — MORPHINE SULFATE ER 60 MG PO TBCR
60.0000 mg | EXTENDED_RELEASE_TABLET | Freq: Three times a day (TID) | ORAL | 0 refills | Status: DC
  Filled 2024-01-10: qty 90, 30d supply, fill #0

## 2024-01-10 MED ORDER — OXYCODONE-ACETAMINOPHEN 10-325 MG PO TABS
1.0000 | ORAL_TABLET | ORAL | 0 refills | Status: DC | PRN
  Filled 2024-01-10: qty 90, 15d supply, fill #0

## 2024-01-10 NOTE — Progress Notes (Signed)
 [facility] Cancer Follow up:    Natalie Cea, MD [address] [place] [address]   DIAGNOSIS:  Cancer Staging  Malignant neoplasm of upper-inner quadrant of left breast in female, estrogen receptor positive (HCC) Staging form: Breast, AJCC 8th Edition - Clinical stage from 05/09/2020: Stage IB (cT2, cN0, cM0, G2, ER+, PR+, HER2+) - Signed by Natalie Cea, MD on 05/09/2020 Stage prefix: Initial diagnosis Histologic grading system: 3 grade system    SUMMARY OF ONCOLOGIC HISTORY: Oncology History  Malignant neoplasm of upper-inner quadrant of left breast in female, estrogen receptor positive (HCC)  04/11/2020 Initial Diagnosis   Patient palpated a left breast lump. Mammogram on 03/08/20 showed a 3.0cm mass at the 11 o'clock position with no axillary adenopathy. 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   05/09/2020 Cancer Staging   Staging form: Breast, AJCC 8th Edition - Clinical stage from 05/09/2020: Stage IB (cT2, cN0, cM0, G2, ER+, PR+, HER2+) - Signed by Natalie Cea, MD on 05/09/2020   06/04/2020 Surgery   Left lumpectomy ([name]): invasive and in situ ductal carcinoma, 3.2cm, clear margins, one left axillary lymph node negative for carcinoma.    02/2021 Relapse/Recurrence   Patient went to orthopedics for right hip pain.  Imaging revealed bone metastasis.  CT CAP: Cortical destruction of the right femoral neck consistent with skeletal metastases at risk for pathologic fracture.  Lucent lesion in the left iliac bone and L3 vertebral body consistent with multifocal skeletal metastases.   04/01/2021 Surgery   04/01/2021: Femoral intramedullary nail  Pathology review: Metastatic breast cancer ER 2% PR 0% HER2 3+ positive   04/08/2021 -  Anti-estrogen oral therapy   Tamoxifen  20 mg was prescribed on 04/08/2021 (discontinued by patient because she felt that the risks and  benefits did not support it )--resumed in 07/2022 based on pathology results from right breast biopsy   05/14/2021 -  Chemotherapy   Subcutaneous Herceptin  Perjeta  (Phesgo ) injection started 05/14/2021, switched to Herceptin  Hylecta 06/11/2021 (Fixed drug eruption, profound diarrhea to Phesgo )     05/20/2021 - 06/03/2021 Radiation Therapy   Palliative radiation to the iliac bone and hip   02/04/2022 Imaging   MRI thoracic spine 02/04/2022: Numerous bone metastases thoracic spine cervical and lumbar spines largest T5, T8, T11.  Chronic compression fractures T4, T5, T10 and T12    05/07/2022 Imaging   CT chest abdomen pelvis: No new or progressive bone metastases.  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   07/29/2022 PET scan   IMPRESSION: 1. Hypermetabolic mass in the posterior deep RIGHT breast consistent primary breast carcinoma. 2. Central nodal mediastinal metastasis to the high LEFT prevascular space and LEFT super clavicular node. 3. 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  [name] recommended changing treatment to Kadcyla and Jinelle wanted to wait until after the holidays.  She will discuss further with Dr. [name] in January.   11/19/2022 - 11/19/2022 Chemotherapy   Patient is on Treatment Plan : BREAST Trastuzumab  IV (8/6) or SQ (600) D1 q21d     04/20/2023 -  Chemotherapy   Patient is on Treatment Plan : BREAST MAINTENANCE Trastuzumab  IV (6) or SQ (600) D1 q21d  x 13 cycles     11/22/2023 - 12/03/2023 Radiation Therapy   Plan Name: Pelvis_R Site: Hip, Right Technique: 3D Mode: Photon Dose Per Fraction: 3 Gy Prescribed Dose (Delivered / Prescribed): 30 Gy / 30 Gy Prescribed Fxs (Delivered / Prescribed): 10 /  10     CURRENT THERAPY:Herceptin   INTERVAL HISTORY:  Discussed the use of AI scribe software for clinical note transcription with the patient, who gave verbal consent to proceed.  Natalie Griffin 48 y.o. female  with a history of breast cancer on Herceptin , presents for a follow-up visit. She reports an upper respiratory infection last week, which delayed her scheduled injection. She has completed radiation treatments and reports significant improvement in pain in the posterior pelvis and right hip. She denies any current issues with diarrhea, a previous problem.  The patient also mentions difficulty obtaining her Percocet prescription in [place]  due to cross-state licensure issues and the pharmacist's concerns about the opiate medication. She expresses frustration with the pharmacy's handling of the situation.  The patient is also under the care of palliative care and is on morphine  and percocet.  She reports her pain is controlled with this regimen and has f/u with palliative care today. She reports feeling tired, which she attributes to having traveled to [place]  too soon after radiation treatment.   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  MEDICAL HISTORY: Past Medical History:  Diagnosis Date   Anemia    Anxiety    Bone metastasis    Breast cancer (HCC) 2021   Left breast invasive ductal carcinoma    Cancer (HCC) 2021   Left breast   Complication of anesthesia    pseudocholinesterase deficiency   Family history of adverse reaction to anesthesia    Father and Aunt have pseudocholinesterase deficiency - Trouble waking up from anesthesia   History of hiatal hernia    History of kidney stones    noted on CT Left nonobstructive   Pseudocholinesterase deficiency     SURGICAL HISTORY: Past Surgical History:  Procedure Laterality Date   BREAST BIOPSY Right 07/21/2022   US  RT BREAST BX W LOC DEV 1ST LESION IMG BX SPEC US  GUIDE 07/21/2022 [facility]   BREAST BIOPSY  06/08/2023   MM RT RADIOACTIVE SEED LOC MAMMO GUIDE 06/08/2023 [facility]   BREAST LUMPECTOMY WITH RADIOACTIVE SEED AND SENTINEL LYMPH NODE BIOPSY Left 06/04/2020   Procedure: LEFT BREAST LUMPECTOMY WITH RADIOACTIVE SEED AND SENTINEL LYMPH NODE MAPPING;  Surgeon: Sim Dryer, MD;  Location: [facility];  Service: General;  Laterality: Left;  PEC BLOCK, RNFA   BREAST LUMPECTOMY WITH RADIOACTIVE SEED LOCALIZATION Right 06/09/2023   Procedure: RIGHT BREAST LUMPECTOMY WITH RADIOACTIVE SEED LOCALIZATION;  Surgeon: Sim Dryer, MD;  Location: [facility];  Service: General;  Laterality: Right;   FEMUR IM NAIL Right 04/01/2021   Procedure: INTRAMEDULLARY (IM) NAIL FEMORAL;  Surgeon: Saundra Curl, MD;  Location: [facility];  Service: Orthopedics;  Laterality: Right;  ROBOTIC ASSISTED BILATERAL SALPINGO OOPHERECTOMY Bilateral 09/30/2022   Procedure: XI ROBOTIC ASSISTED BILATERAL SALPINGO OOPHORECTOMY;  Surgeon: Suzi Essex, MD;  Location: [facility];  Service: Gynecology;  Laterality: Bilateral;   WISDOM TOOTH EXTRACTION      SOCIAL HISTORY: Social History   Socioeconomic History   Marital status: Single    Spouse name: Not on file   Number of children: Not on file   Years of education: Not on file   Highest education level: Not on file  Occupational History   Not on file  Tobacco Use   Smoking  status: Former    Current packs/day: 0.00    Types: Cigarettes    Quit date: 05/19/2020    Years since quitting: 3.6   Smokeless tobacco: Never  Vaping Use   Vaping status: Never Used  Substance and Sexual Activity   Alcohol use: Not Currently   Drug use: Never   Sexual activity: Not Currently  Other Topics Concern   Not on file  Social History Narrative   Not on file   Social Drivers of Health   Financial Resource Strain: Not on file  Food Insecurity: No Food Insecurity (06/28/2023)   Hunger Vital Sign    Worried About Running Out of Food in the Last Year: Never true    Ran Out of Food in the Last Year: Never true  Transportation Needs: No Transportation Needs (06/28/2023)   PRAPARE - Administrator, Civil Service (Medical): No    Lack of Transportation (Non-Medical): No  Physical Activity: Not on file  Stress: Not on file  Social Connections: Not on file  Intimate Partner Violence: Not At Risk (06/28/2023)   Humiliation, Afraid, Rape, and Kick questionnaire    Fear of Current or Ex-Partner: No    Emotionally Abused: No    Physically Abused: No    Sexually Abused: No    FAMILY HISTORY: Family History  Problem Relation Age of Onset   Colon cancer Neg Hx    Esophageal cancer Neg Hx    Stomach cancer Neg Hx    Rectal cancer Neg Hx     Review of Systems  Constitutional:  Negative for appetite change, chills, fatigue, fever and unexpected weight change.  HENT:   Negative for hearing loss, lump/mass and trouble swallowing.   Eyes:  Negative for eye problems and icterus.  Respiratory:  Negative for chest tightness, cough and shortness of breath.   Cardiovascular:  Negative for chest pain, leg swelling and palpitations.  Gastrointestinal:  Negative for abdominal distention, abdominal pain, constipation, diarrhea, nausea and vomiting.  Endocrine: Negative for hot flashes.  Genitourinary:  Negative for difficulty urinating.   Musculoskeletal:  Negative for  arthralgias.  Skin:  Negative for itching and rash.  Neurological:  Negative for dizziness, extremity weakness, headaches and numbness.  Hematological:  Negative for adenopathy. Does not bruise/bleed easily.  Psychiatric/Behavioral:  Negative for depression. The patient is not nervous/anxious.       PHYSICAL EXAMINATION    Vitals:   01/10/24 1104  BP: (!) 109/56  Pulse: 73  Resp: 16  Temp: 98 F (36.7 C)  SpO2: 98%    Physical Exam Constitutional:      General: She is not in acute distress.    Appearance: Normal appearance. She is not toxic-appearing.  HENT:     Head: Normocephalic and atraumatic.     Mouth/Throat:     Mouth: Mucous membranes are moist.     Pharynx: Oropharynx is  clear. No oropharyngeal exudate or posterior oropharyngeal erythema.  Eyes:     General: No scleral icterus. Cardiovascular:     Rate and Rhythm: Normal rate and regular rhythm.     Pulses: Normal pulses.     Heart sounds: Normal heart sounds.  Pulmonary:     Effort: Pulmonary effort is normal.     Breath sounds: Normal breath sounds.  Abdominal:     General: Abdomen is flat. Bowel sounds are normal. There is no distension.     Palpations: Abdomen is soft.     Tenderness: There is no abdominal tenderness.  Musculoskeletal:        General: No swelling.     Cervical back: Neck supple.  Lymphadenopathy:     Cervical: No cervical adenopathy.  Skin:    General: Skin is warm and dry.     Findings: No rash.  Neurological:     General: No focal deficit present.     Mental Status: She is alert.  Psychiatric:        Mood and Affect: Mood normal.        Behavior: Behavior normal.     LABORATORY DATA:  CBC    Component Value Date/Time   WBC 5.5 11/12/2023 1411   WBC 7.2 06/27/2023 0709   RBC 4.01 11/12/2023 1411   HGB 11.1 (L) 11/12/2023 1411   HCT 35.3 (L) 11/12/2023 1411   PLT 306 11/12/2023 1411   MCV 88.0 11/12/2023 1411   MCH 27.7 11/12/2023 1411   MCHC 31.4 11/12/2023 1411    RDW 15.9 (H) 11/12/2023 1411   LYMPHSABS 0.6 (L) 11/12/2023 1411   MONOABS 0.5 11/12/2023 1411   EOSABS 0.4 11/12/2023 1411   BASOSABS 0.0 11/12/2023 1411    CMP     Component Value Date/Time   NA 139 11/12/2023 1411   K 3.7 11/12/2023 1411   CL 101 11/12/2023 1411   CO2 28 11/12/2023 1411   GLUCOSE 102 (H) 11/12/2023 1411   BUN 15 11/12/2023 1411   CREATININE 0.71 11/12/2023 1411   CALCIUM  10.1 11/12/2023 1411   PROT 7.4 11/12/2023 1411   ALBUMIN 4.3 11/12/2023 1411   AST 24 11/12/2023 1411   ALT 12 11/12/2023 1411   ALKPHOS 282 (H) 11/12/2023 1411   BILITOT 0.2 11/12/2023 1411   GFRNONAA >60 11/12/2023 1411   GFRAA >60 05/29/2020 0926     ASSESSMENT and THERAPY PLAN:   Malignant neoplasm of upper-inner quadrant of left breast in female, estrogen receptor positive (HCC) 06/04/2020:Left lumpectomy ([name]): invasive and in situ ductal carcinoma, 3.2cm, clear margins, one left axillary lymph node negative for carcinoma.  Patient refused adjuvant chemo, adjuvant radiation and adjuvant antiestrogen therapy.   Patient went to orthopedics for right hip pain.  Imaging revealed bone metastasis.  CT CAP: Cortical destruction of the right femoral neck consistent with skeletal metastases at risk for pathologic fracture.  Lucent lesion in the left iliac bone and L3 vertebral body consistent with multifocal skeletal metastases. 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%, HER2 3+ positive, Ki-67 35%    Treatment HX: Oophorectomy: 09/30/2022 Current treatment: Anastrozole , Herceptin  Hylecta Patient refused switching from Herceptin  to Kadcyla (her insurance denied Herceptin  Hylecta and patient does not want to receive it in IV form), stopped Herceptin  (last given 09/17/2022) Neratinib  started 01/05/2023 discontinued 02/08/2023 Hospitalization 03/24/2023-03/27/2023 increased pain in the back: Vertebral mets and compression fractures: Radiation to lumbar spine completed 04/12/2023 resumed Herceptin  Hylecta 04/20/2023 CT CAP 05/27/2023: Increase sclerosis of bone metastases. 06/09/2023: Right lumpectomy: Grade 2 IDC 1.9 cm with high-grade DCIS, margins negative, LVI present, ER 90%, PR 60%, HER2 3+ positive, Ki67 35%, RCB class II CT angio 06/26/2023: No PE widespread bone metastases.  New subacute pathological fracture Rt sixth and ninth ribs CT CAP 11/05/23: Ext sclerotic and lucent bone lesions, Some more sclerosis, Right iliac bone lesion increasing (3.2 cm) Radiation to pelvis 11/2023   Current treatment: Herceptin  Hylecta every 4 weeks Pain control: MS Contin  and MS IR  Breast cancer with bone metastases Breast cancer with metastases to the pelvis and right hip. Completed radiation therapy with significant pain reduction. Currently on Herceptin . Upper respiratory infection delayed Herceptin  injection, now ready for treatment. Echocardiogram due in June. - Administer Herceptin  injection today. - Schedule echocardiogram for June. - Administer Xgeva  injection at the next appointment.  Pain management Under palliative care, using morphine  for pain. Encountered prescription issues with Percocet due to state line regulations and pharmacy delays, causing frustration. - Continue morphine  and  percocet for pain management at the guidance of palliative care. - Coordinate with palliative care for prescription management.  Fatigue Fatigue attributed to recent radiation treatment and travel. - Provide supportive care for fatigue.  RTC in 4 weeks for labs, f/u, and her next treatment.   All questions were answered. The patient knows to call the clinic with any problems, questions or concerns. We can certainly see the patient much sooner if necessary.  Total encounter time:20 minutes*in face-to-face visit time, chart review, lab review, care coordination, order entry, and documentation of the encounter time.    Alwin Baars, NP 01/10/24 2:36 PM Medical Oncology and Hematology [facility] [address] [place], [place] [address] Tel. [phone]    Fax. [phone]  *Total Encounter Time as defined by the Centers for Medicare and Medicaid Services includes, in addition to the face-to-face time of a patient visit (documented in the note above) non-face-to-face time: obtaining and reviewing outside history, ordering and reviewing medications, tests or procedures, care coordination (communications with other health care professionals or caregivers) and documentation in the medical record.

## 2024-01-10 NOTE — Assessment & Plan Note (Signed)
 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  Patient refused adjuvant chemo, adjuvant radiation and adjuvant antiestrogen therapy.   Patient went to orthopedics for right hip pain.  Imaging revealed bone metastasis.  CT CAP: Cortical destruction of the right femoral neck consistent with skeletal metastases at risk for pathologic fracture.  Lucent lesion in the left iliac bone and L3 vertebral body consistent with multifocal skeletal metastases. 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%, HER2 3+ positive, Ki-67 35%    Treatment HX: Oophorectomy: 09/30/2022 Current treatment: Anastrozole , Herceptin  Hylecta Patient refused switching from Herceptin  to Kadcyla (her insurance denied Herceptin  Hylecta and patient does not want to receive it in IV form), stopped Herceptin  (last given 09/17/2022) Neratinib  started 01/05/2023 discontinued 02/08/2023 Hospitalization 03/24/2023-03/27/2023 increased pain in the back: Vertebral mets and compression fractures: Radiation to lumbar spine completed 04/12/2023 resumed Herceptin  Hylecta 04/20/2023 CT CAP 05/27/2023: Increase sclerosis of bone metastases. 06/09/2023: Right lumpectomy:  Grade 2 IDC 1.9 cm with high-grade DCIS, margins negative, LVI present, ER 90%, PR 60%, HER2 3+ positive, Ki67 35%, RCB class II CT angio 06/26/2023: No PE widespread bone metastases.  New subacute pathological fracture Rt sixth and ninth ribs CT CAP 11/05/23: Ext sclerotic and lucent bone lesions, Some more sclerosis, Right iliac bone lesion increasing (3.2 cm) Radiation to pelvis 11/2023   Current treatment: Herceptin  Hylecta every 4 weeks Pain control: MS Contin  and MS IR  Breast cancer with bone metastases Breast cancer with metastases to the pelvis and right hip. Completed radiation therapy with significant pain reduction. Currently on Herceptin . Upper respiratory infection delayed Herceptin  injection, now ready for treatment. Echocardiogram due in June. - Administer Herceptin  injection today. - Schedule echocardiogram for June. - Administer Xgeva  injection at the next appointment.  Pain management Under palliative care, using morphine  for pain. Encountered prescription issues with Percocet due to state line regulations and pharmacy delays, causing frustration. - Continue morphine  and percocet for pain management at the guidance of palliative care. - Coordinate with palliative care for prescription management.  Fatigue Fatigue attributed to recent radiation treatment and travel. - Provide supportive care for fatigue.  RTC in 4 weeks for labs, f/u, and her next treatment.

## 2024-01-11 ENCOUNTER — Encounter: Payer: Self-pay | Admitting: Hematology and Oncology

## 2024-01-11 NOTE — Progress Notes (Signed)
 Palliative Medicine [facility]  Telephone:([phone] Fax:(336) [phone]   Name: Natalie Griffin Date: 01/11/2024 MRN: [id number]  DOB: [birth year]  Patient Care Team: Cameron Cea, MD as PCP - General (Hematology and Oncology) Alane Hsu, RN as Oncology Nurse Navigator Auther Bo, RN as Oncology Nurse Navigator Murleen Arms, MD as Consulting Physician (Hematology and Oncology) Pickenpack-Cousar, Giles Labrum, NP as Nurse Practitioner (Hospice and Palliative Medicine)    INTERVAL HISTORY: Natalie Griffin is a 48 y.o. 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  Palliative ask to see for symptom management and goals of care.   SOCIAL HISTORY:     reports that she quit smoking about 3 years ago. Her smoking use included cigarettes. She has never used smokeless tobacco. She reports that she does not currently use alcohol. She reports that she does not use drugs.  ADVANCE DIRECTIVES:  Advanced directives on file naming Shirleen Double as patient healthcare power of attorney if patient becomes unable to speak for themselves.   CODE STATUS: DNR  PAST MEDICAL HISTORY: Past Medical History:  Diagnosis Date   Anemia    Anxiety    Bone metastasis    Breast cancer (HCC) 2021   Left breast invasive ductal carcinoma   Cancer (HCC) 2021   Left breast   Complication of anesthesia    pseudocholinesterase deficiency   Family history of adverse reaction to anesthesia    Father and Aunt have pseudocholinesterase deficiency - Trouble waking up from anesthesia   History of hiatal hernia    History of kidney stones    noted on CT Left nonobstructive   Pseudocholinesterase deficiency     ALLERGIES:  is allergic to adhesive [tape], fentanyl , phesgo  [pertuz-trastuz-hyaluron-zzxf], tetanus toxoid, and succinylcholine.  MEDICATIONS:  Current Outpatient Medications   Medication Sig Dispense Refill   ALPRAZolam  (XANAX ) 0.5 MG tablet Take 1 tablet (0.5 mg total) by mouth 3 (three) times daily as needed for anxiety. 90 tablet 3   anastrozole  (ARIMIDEX ) 1 MG tablet Take 1 tablet (1 mg total) by mouth daily. 90 tablet 3   diphenoxylate -atropine  (LOMOTIL ) 2.5-0.025 MG tablet Take 1 tablet by mouth 4 (four) times daily as needed for diarrhea or loose stools. 30 tablet 2   methocarbamol  (ROBAXIN ) 500 MG tablet Take 1 tablet (500 mg total) by mouth 3 (three) times daily. 90 tablet 1   morphine  (MS CONTIN ) 60 MG 12 hr tablet Take 1 tablet (60 mg total) by mouth 3 (three) times daily. 90 tablet 0   Multiple Vitamin (ONE DAILY MULTIVITAMIN ADULT PO) Take 1 tablet by mouth daily.     oxyCODONE -acetaminophen  (PERCOCET) 10-325 MG tablet Take 1 tablet by mouth every 4 (four) hours as needed for pain. 90 tablet 0   No current facility-administered medications for this visit.    VITAL SIGNS: There were no vitals taken for this visit. There were no vitals filed for this visit.   Estimated body mass index is 23.55 kg/m as calculated from the following:   Height as of 10/13/23: 5\' 5"  (1.651 m).   Weight as of an earlier encounter on 01/10/24: 141 lb 8 oz (64.2 kg).   PERFORMANCE STATUS (ECOG) : 1 - Symptomatic but completely ambulatory   Physical Exam General: NAD Cardiovascular: regular rate and rhythm Pulmonary: normal breathing pattern  Skin: no rashes Neurological: AAO x3  Discussed the use of AI scribe software for clinical  note transcription with the patient, who gave verbal consent to proceed.  IMPRESSION:  I saw Ms. Kaizley A Hinderman during her infusion today for symptom management follow-up. Tolerating treatment without difficulty. Feeling well overall. Recently returned from a trip to [place] . Denies concerns with nausea, vomiting, constipation, or diarrhea. Is remaining active as possible. Much appreciative of improved pain and ability to ambulate  without difficulty.   We discussed her pain at length. Ms. Schuler reports pain is much better controlled. Pain has decreased with radiation treatments which she is appreciative of.  The decrease in her pain has allowed her to reduce her pain medication from every four hours to every six hours as needed with breakthrough medication and decrease MS Contin  down to every 12 hours from 8 hours. She does not require breakthrough around the clock. Some days are better than others depending on level of activity. No adjustments to regimen at this time.   All questions answered and support provided.  We will plan to continue close follow-up.  Assessment and Plan  Pain management Pain controlled with current medication regimen. Adjusted dosing improved stability and mobility. Much improved with radiation.  - Continue current pain management regimen. -Percocet to 2-3 tablets every 6 hours as needed for pain (increased from every 4 hours) -MS Contin  60mg  every 12 hours. (decreased from every 8 hours)  I will plan to see patient back in 6-8 weeks. Sooner if needed.  Patient expressed understanding and was in agreement with this plan. She also understands that She can call the clinic at any time with any questions, concerns, or complaints.   Any controlled substances utilized were prescribed in the context of palliative care. PDMP has been reviewed.   Visit consisted of counseling and education dealing with the complex and emotionally intense issues of symptom management and palliative care in the setting of serious and potentially life-threatening illness.  Dellia Ferguson, AGPCNP-BC  Palliative Medicine Team/[facility]

## 2024-01-31 ENCOUNTER — Ambulatory Visit: Admitting: Family Medicine

## 2024-01-31 ENCOUNTER — Ambulatory Visit: Payer: No Typology Code available for payment source

## 2024-01-31 ENCOUNTER — Inpatient Hospital Stay: Payer: No Typology Code available for payment source | Attending: Hematology and Oncology

## 2024-01-31 ENCOUNTER — Encounter: Payer: Self-pay | Admitting: *Deleted

## 2024-01-31 ENCOUNTER — Inpatient Hospital Stay (HOSPITAL_BASED_OUTPATIENT_CLINIC_OR_DEPARTMENT_OTHER): Payer: No Typology Code available for payment source | Admitting: Nurse Practitioner

## 2024-01-31 VITALS — BP 122/81 | HR 82 | Temp 97.7°F | Resp 16 | Wt 142.4 lb

## 2024-01-31 DIAGNOSIS — C7951 Secondary malignant neoplasm of bone: Secondary | ICD-10-CM

## 2024-01-31 DIAGNOSIS — Z515 Encounter for palliative care: Secondary | ICD-10-CM

## 2024-01-31 DIAGNOSIS — G893 Neoplasm related pain (acute) (chronic): Secondary | ICD-10-CM

## 2024-01-31 DIAGNOSIS — C50212 Malignant neoplasm of upper-inner quadrant of left female breast: Secondary | ICD-10-CM

## 2024-01-31 DIAGNOSIS — R530 Neoplastic (malignant) related fatigue: Secondary | ICD-10-CM

## 2024-01-31 DIAGNOSIS — Z170 Estrogen receptor positive status [ER+]: Secondary | ICD-10-CM | POA: Diagnosis not present

## 2024-01-31 DIAGNOSIS — Z5112 Encounter for antineoplastic immunotherapy: Secondary | ICD-10-CM | POA: Diagnosis present

## 2024-01-31 LAB — CBC WITH DIFFERENTIAL (CANCER CENTER ONLY)
Abs Immature Granulocytes: 0.02 10*3/uL (ref 0.00–0.07)
Basophils Absolute: 0 10*3/uL (ref 0.0–0.1)
Basophils Relative: 1 %
Eosinophils Absolute: 0.4 10*3/uL (ref 0.0–0.5)
Eosinophils Relative: 8 %
HCT: 35.2 % — ABNORMAL LOW (ref 36.0–46.0)
Hemoglobin: 11.5 g/dL — ABNORMAL LOW (ref 12.0–15.0)
Immature Granulocytes: 0 %
Lymphocytes Relative: 15 %
Lymphs Abs: 0.7 10*3/uL (ref 0.7–4.0)
MCH: 28.8 pg (ref 26.0–34.0)
MCHC: 32.7 g/dL (ref 30.0–36.0)
MCV: 88 fL (ref 80.0–100.0)
Monocytes Absolute: 0.5 10*3/uL (ref 0.1–1.0)
Monocytes Relative: 11 %
Neutro Abs: 3.2 10*3/uL (ref 1.7–7.7)
Neutrophils Relative %: 65 %
Platelet Count: 266 10*3/uL (ref 150–400)
RBC: 4 MIL/uL (ref 3.87–5.11)
RDW: 15.9 % — ABNORMAL HIGH (ref 11.5–15.5)
WBC Count: 4.8 10*3/uL (ref 4.0–10.5)
nRBC: 0 % (ref 0.0–0.2)

## 2024-01-31 LAB — CMP (CANCER CENTER ONLY)
ALT: 13 U/L (ref 0–44)
AST: 21 U/L (ref 15–41)
Albumin: 4.4 g/dL (ref 3.5–5.0)
Alkaline Phosphatase: 112 U/L (ref 38–126)
Anion gap: 7 (ref 5–15)
BUN: 12 mg/dL (ref 6–20)
CO2: 25 mmol/L (ref 22–32)
Calcium: 8.7 mg/dL — ABNORMAL LOW (ref 8.9–10.3)
Chloride: 108 mmol/L (ref 98–111)
Creatinine: 0.67 mg/dL (ref 0.44–1.00)
GFR, Estimated: >60 mL/min (ref >60)
Glucose, Bld: 105 mg/dL — ABNORMAL HIGH (ref 70–99)
Potassium: 3.5 mmol/L (ref 3.5–5.1)
Sodium: 140 mmol/L (ref 135–145)
Total Bilirubin: 0.3 mg/dL (ref 0.0–1.2)
Total Protein: 7.4 g/dL (ref 6.5–8.1)

## 2024-01-31 MED ORDER — TRASTUZUMAB-HYALURONIDASE-OYSK 600-10000 MG-UNT/5ML ~~LOC~~ SOLN
600.0000 mg | Freq: Once | SUBCUTANEOUS | Status: AC
  Administered 2024-01-31: 600 mg via SUBCUTANEOUS
  Filled 2024-01-31: qty 5

## 2024-01-31 MED ORDER — DENOSUMAB 120 MG/1.7ML ~~LOC~~ SOLN
120.0000 mg | Freq: Once | SUBCUTANEOUS | Status: AC
Start: 2024-01-31 — End: 2024-01-31
  Administered 2024-01-31: 120 mg via SUBCUTANEOUS

## 2024-01-31 NOTE — Progress Notes (Signed)
 Calcium  8.7. Ok to proceed with Xgeva  per dr. [name]

## 2024-01-31 NOTE — Progress Notes (Signed)
 Palliative Medicine [facility]  Telephone:(336) [phone] Fax:(336) [phone]   Name: Natalie Griffin Date: 01/31/2024 MRN: [id number]  DOB: 09/10/1976  Patient Care Team: Cameron Cea, MD as PCP - General (Hematology and Oncology) Alane Hsu, RN as Oncology Nurse Navigator Auther Bo, RN as Oncology Nurse Navigator Murleen Arms, MD as Consulting Physician (Hematology and Oncology) Pickenpack-Cousar, Giles Labrum, NP as Nurse Practitioner (Hospice and Palliative Medicine)    INTERVAL HISTORY: Natalie Griffin is a 48 y.o. 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 Palliative ask to see for symptom management and goals of care.   SOCIAL HISTORY:     reports that she quit smoking about 3 years ago. Her smoking use included cigarettes. She has never used smokeless tobacco. She reports that she does not currently use alcohol. She reports that she does not use drugs.  ADVANCE DIRECTIVES:  Advanced directives on file naming Shirleen Double as patient healthcare power of attorney if patient becomes unable to speak for themselves.   CODE STATUS: DNR  PAST MEDICAL HISTORY: Past Medical History:  Diagnosis Date   Anemia    Anxiety    Bone metastasis    Breast cancer (HCC) 2021   Left breast invasive ductal carcinoma   Cancer (HCC) 2021   Left breast   Complication of anesthesia    pseudocholinesterase deficiency   Family history of adverse reaction to anesthesia    Father and Aunt have pseudocholinesterase deficiency - Trouble waking up from anesthesia   History of hiatal hernia    History of kidney stones    noted on CT Left nonobstructive   Pseudocholinesterase deficiency     ALLERGIES:  is allergic to adhesive [tape], fentanyl , phesgo  [pertuz-trastuz-hyaluron-zzxf], tetanus toxoid, and succinylcholine.  MEDICATIONS:  Current Outpatient Medications   Medication Sig Dispense Refill   ALPRAZolam  (XANAX ) 0.5 MG tablet Take 1 tablet (0.5 mg total) by mouth 3 (three) times daily as needed for anxiety. 90 tablet 3   anastrozole  (ARIMIDEX ) 1 MG tablet Take 1 tablet (1 mg total) by mouth daily. 90 tablet 3   diphenoxylate -atropine  (LOMOTIL ) 2.5-0.025 MG tablet Take 1 tablet by mouth 4 (four) times daily as needed for diarrhea or loose stools. 30 tablet 2   methocarbamol  (ROBAXIN ) 500 MG tablet Take 1 tablet (500 mg total) by mouth 3 (three) times daily. 90 tablet 1   morphine  (MS CONTIN ) 60 MG 12 hr tablet Take 1 tablet (60 mg total) by mouth 3 (three) times daily. 90 tablet 0   Multiple Vitamin (ONE DAILY MULTIVITAMIN ADULT PO) Take 1 tablet by mouth daily.     oxyCODONE -acetaminophen  (PERCOCET) 10-325 MG tablet Take 1 tablet by mouth every 4 (four) hours as needed for pain. 90 tablet 0   No current facility-administered medications for this visit.    VITAL SIGNS: BP 122/81 (BP Location: Right Arm, Patient Position: Sitting)   Pulse 82   Temp 97.7 F (36.5 C) (Temporal)   Resp 16   Wt 142 lb 6.4 oz (64.6 kg)   SpO2 97%   BMI 23.70 kg/m  Filed Weights   01/31/24 1504  Weight: 142 lb 6.4 oz (64.6 kg)    Estimated body mass index is 23.7 kg/m as calculated from the following:   Height as of 10/13/23: 5\' 5"  (1.651 m).   Weight as of this encounter: 142 lb 6.4 oz (64.6 kg).   PERFORMANCE STATUS (ECOG) :  1 - Symptomatic but completely ambulatory   Physical Exam General: NAD Cardiovascular: regular rate and rhythm Pulmonary: normal breathing pattern Extremities: no edema, no joint deformities Skin: no rashes Neurological: AAO x3  IMPRESSION: Discussed the use of AI scribe software for clinical note transcription with the patient, who gave verbal consent to proceed.  History of Present Illness Natalie Griffin is a 48 year old female who presents to clinic for symptom management follow-up.  No acute distress noted.  Patient is  doing well overall.  Appetite is good.  Denies concerns of nausea, vomiting, constipation, or diarrhea.  Occasional fatigue however patient is remaining as active as possible.  Much appreciative of how well she is feeling with improved pain and ability to engage in more activities while listening to her body.  [place] reports her pain is well-controlled on current regimen.  Some days tend to be better than others depending on level of activity and weather.  She is currently taking MS Contin  60 mg twice daily which is a decrease from every 8 hours every 8 hours as, and Percocet 10/325 mg every 4-6 hours as needed.  Does not require breakthrough medication around-the-clock.  No adjustments to regimen at this time.  All questions answered and support provided.  Assessment & Plan Cancer related pain management  Pain well-managed with morphine  and Percocet.  No adjustments to current regimen at this time.  Pain is much improved allowing decreased use in her extended release. - Continue MS Contin  60 mg every 12 hours. - Continue Percocet 10/325 mg every 4-6 hours as needed for breakthrough pain. - Will continue to closely monitor and adjust regimen as needed.  Radiation therapy for cancer Completed radiation therapy with improved energy levels.  I will plan to see patient back in 4-6 weeks.  Sooner if needed.  Patient expressed understanding and was in agreement with this plan. She also understands that She can call the clinic at any time with any questions, concerns, or complaints.   Any controlled substances utilized were prescribed in the context of palliative care. PDMP has been reviewed.   Visit consisted of counseling and education dealing with the complex and emotionally intense issues of symptom management and palliative care in the setting of serious and potentially life-threatening illness.  Dellia Ferguson, AGPCNP-BC  Palliative Medicine Team/[facility]

## 2024-02-01 ENCOUNTER — Ambulatory Visit: Admitting: Family Medicine

## 2024-02-01 LAB — CANCER ANTIGEN 27.29: CA 27.29: 1011.3 U/mL — ABNORMAL HIGH (ref 0.0–38.6)

## 2024-02-04 ENCOUNTER — Other Ambulatory Visit (HOSPITAL_BASED_OUTPATIENT_CLINIC_OR_DEPARTMENT_OTHER): Payer: Self-pay

## 2024-02-13 ENCOUNTER — Other Ambulatory Visit: Payer: Self-pay | Admitting: Nurse Practitioner

## 2024-02-13 ENCOUNTER — Other Ambulatory Visit: Payer: Self-pay | Admitting: Hematology and Oncology

## 2024-02-13 DIAGNOSIS — G893 Neoplasm related pain (acute) (chronic): Secondary | ICD-10-CM

## 2024-02-13 DIAGNOSIS — Z515 Encounter for palliative care: Secondary | ICD-10-CM

## 2024-02-13 DIAGNOSIS — C7951 Secondary malignant neoplasm of bone: Secondary | ICD-10-CM

## 2024-02-14 ENCOUNTER — Other Ambulatory Visit (HOSPITAL_COMMUNITY): Payer: Self-pay

## 2024-02-14 MED ORDER — OXYCODONE-ACETAMINOPHEN 10-325 MG PO TABS
1.0000 | ORAL_TABLET | ORAL | 0 refills | Status: DC | PRN
  Filled 2024-02-14: qty 90, 15d supply, fill #0

## 2024-02-15 ENCOUNTER — Other Ambulatory Visit (HOSPITAL_COMMUNITY): Payer: Self-pay

## 2024-02-15 ENCOUNTER — Other Ambulatory Visit: Payer: Self-pay

## 2024-02-15 MED ORDER — ALPRAZOLAM 0.5 MG PO TABS
0.5000 mg | ORAL_TABLET | Freq: Three times a day (TID) | ORAL | 3 refills | Status: DC | PRN
Start: 2024-02-15 — End: 2024-05-12
  Filled 2024-02-15: qty 90, 30d supply, fill #0
  Filled 2024-03-31 – 2024-04-11 (×2): qty 90, 30d supply, fill #1

## 2024-02-17 ENCOUNTER — Other Ambulatory Visit (HOSPITAL_COMMUNITY): Payer: Self-pay

## 2024-02-18 ENCOUNTER — Encounter: Payer: Self-pay | Admitting: Hematology and Oncology

## 2024-02-18 ENCOUNTER — Other Ambulatory Visit: Payer: Self-pay | Admitting: *Deleted

## 2024-02-18 DIAGNOSIS — Z79899 Other long term (current) drug therapy: Secondary | ICD-10-CM

## 2024-02-21 ENCOUNTER — Other Ambulatory Visit: Payer: Self-pay

## 2024-02-21 ENCOUNTER — Other Ambulatory Visit: Payer: Self-pay | Admitting: Nurse Practitioner

## 2024-02-21 ENCOUNTER — Other Ambulatory Visit (HOSPITAL_COMMUNITY): Payer: Self-pay

## 2024-02-21 DIAGNOSIS — C7951 Secondary malignant neoplasm of bone: Secondary | ICD-10-CM

## 2024-02-21 DIAGNOSIS — Z170 Estrogen receptor positive status [ER+]: Secondary | ICD-10-CM

## 2024-02-21 DIAGNOSIS — Z515 Encounter for palliative care: Secondary | ICD-10-CM

## 2024-02-21 DIAGNOSIS — G893 Neoplasm related pain (acute) (chronic): Secondary | ICD-10-CM

## 2024-02-21 MED ORDER — MORPHINE SULFATE ER 60 MG PO TBCR
60.0000 mg | EXTENDED_RELEASE_TABLET | Freq: Three times a day (TID) | ORAL | 0 refills | Status: DC
Start: 2024-02-21 — End: 2024-03-31
  Filled 2024-02-21: qty 90, 30d supply, fill #0

## 2024-02-28 ENCOUNTER — Other Ambulatory Visit (HOSPITAL_COMMUNITY): Payer: Self-pay

## 2024-02-28 ENCOUNTER — Inpatient Hospital Stay: Attending: Hematology and Oncology | Admitting: Hematology and Oncology

## 2024-02-28 ENCOUNTER — Inpatient Hospital Stay

## 2024-02-28 ENCOUNTER — Ambulatory Visit

## 2024-02-28 ENCOUNTER — Ambulatory Visit: Admitting: Hematology and Oncology

## 2024-02-28 VITALS — HR 97

## 2024-02-28 VITALS — BP 132/89 | HR 106 | Temp 98.2°F | Resp 18 | Ht 65.0 in | Wt 145.3 lb

## 2024-02-28 DIAGNOSIS — C50411 Malignant neoplasm of upper-outer quadrant of right female breast: Secondary | ICD-10-CM | POA: Insufficient documentation

## 2024-02-28 DIAGNOSIS — Z5112 Encounter for antineoplastic immunotherapy: Secondary | ICD-10-CM | POA: Insufficient documentation

## 2024-02-28 DIAGNOSIS — C7951 Secondary malignant neoplasm of bone: Secondary | ICD-10-CM | POA: Insufficient documentation

## 2024-02-28 DIAGNOSIS — C50212 Malignant neoplasm of upper-inner quadrant of left female breast: Secondary | ICD-10-CM | POA: Diagnosis not present

## 2024-02-28 DIAGNOSIS — C781 Secondary malignant neoplasm of mediastinum: Secondary | ICD-10-CM | POA: Insufficient documentation

## 2024-02-28 DIAGNOSIS — Z170 Estrogen receptor positive status [ER+]: Secondary | ICD-10-CM | POA: Diagnosis not present

## 2024-02-28 MED ORDER — ONDANSETRON 4 MG PO TBDP
4.0000 mg | ORAL_TABLET | Freq: Three times a day (TID) | ORAL | 3 refills | Status: AC | PRN
  Filled 2024-02-28: qty 20, 7d supply, fill #0
  Filled 2024-05-30 – 2024-06-27 (×4): qty 20, 7d supply, fill #1

## 2024-02-28 MED ORDER — TRASTUZUMAB-HYALURONIDASE-OYSK 600-10000 MG-UNT/5ML ~~LOC~~ SOLN
600.0000 mg | Freq: Once | SUBCUTANEOUS | Status: AC
  Administered 2024-02-28: 600 mg via SUBCUTANEOUS
  Filled 2024-02-28: qty 5

## 2024-02-28 MED ORDER — HEPARIN SOD (PORK) LOCK FLUSH 100 UNIT/ML IV SOLN
500.0000 [IU] | Freq: Once | INTRAVENOUS | Status: DC | PRN
Start: 2024-02-28 — End: 2024-02-28

## 2024-02-28 MED ORDER — SODIUM CHLORIDE 0.9 % IV SOLN: Freq: Once | INTRAVENOUS | Status: DC

## 2024-02-28 MED ORDER — SODIUM CHLORIDE 0.9% FLUSH: 10.0000 mL | INTRAVENOUS | Status: DC | PRN

## 2024-02-28 NOTE — Patient Instructions (Signed)
 [facility] - A DEPT OF . [facility]  Discharge Instructions: Thank you for choosing [facility] to provide your oncology and hematology care.   If you have a lab appointment with the Cancer Center, please go directly to the Cancer Center and check in at the registration area.   Wear comfortable clothing and clothing appropriate for easy access to any Portacath or PICC line.   We strive to give you quality time with your provider. You may need to reschedule your appointment if you arrive late (15 or more minutes).  Arriving late affects you and other patients whose appointments are after yours.  Also, if you miss three or more appointments without notifying the office, you may be dismissed from the clinic at the provider's discretion.      For prescription refill requests, have your pharmacy contact our office and allow 72 hours for refills to be completed.    Today you received the following chemotherapy and/or immunotherapy agents herceptin  hylecta      To help prevent nausea and vomiting after your treatment, we encourage you to take your nausea medication as directed.  BELOW ARE SYMPTOMS THAT SHOULD BE REPORTED IMMEDIATELY: *FEVER GREATER THAN 100.4 F (38 C) OR HIGHER *CHILLS OR SWEATING *NAUSEA AND VOMITING THAT IS NOT CONTROLLED WITH YOUR NAUSEA MEDICATION *UNUSUAL SHORTNESS OF BREATH *UNUSUAL BRUISING OR BLEEDING *URINARY PROBLEMS (pain or burning when urinating, or frequent urination) *BOWEL PROBLEMS (unusual diarrhea, constipation, pain near the anus) TENDERNESS IN MOUTH AND THROAT WITH OR WITHOUT PRESENCE OF ULCERS (sore throat, sores in mouth, or a toothache) UNUSUAL RASH, SWELLING OR PAIN  UNUSUAL VAGINAL DISCHARGE OR ITCHING   Items with * indicate a potential emergency and should be followed up as soon as possible or go to the Emergency Department if any problems should occur.  Please show the CHEMOTHERAPY ALERT CARD or  IMMUNOTHERAPY ALERT CARD at check-in to the Emergency Department and triage nurse.  Should you have questions after your visit or need to cancel or reschedule your appointment, please contact [facility] - A DEPT OF [facility]  Dept: [phone]  and follow the prompts.  Office hours are 8:00 a.m. to 4:30 p.m. Monday - Friday. Please note that voicemails left after 4:00 p.m. may not be returned until the following business day.  We are closed weekends and major holidays. You have access to a nurse at all times for urgent questions. Please call the main number to the clinic Dept: [phone] and follow the prompts.   For any non-urgent questions, you may also contact your provider using MyChart. We now offer e-Visits for anyone 75 and older to request care online for non-urgent symptoms. For details visit [email or web address].   Also download the MyChart app! Go to the app store, search "MyChart", open the app, select [facility], and log in with your MyChart username and password.

## 2024-02-28 NOTE — Assessment & Plan Note (Signed)
 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  Patient refused adjuvant chemo, adjuvant radiation and adjuvant antiestrogen therapy.   Patient went to orthopedics for right hip pain.  Imaging revealed bone metastasis.  CT CAP: Cortical destruction of the right femoral neck consistent with skeletal metastases at risk for pathologic fracture.  Lucent lesion in the left iliac bone and L3 vertebral body consistent with multifocal skeletal metastases. 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%, HER2 3+ positive, Ki-67 35%    Treatment plan change: Oophorectomy: 09/30/2022 Current treatment: Anastrozole , Herceptin  Hylecta Patient refused switching from Herceptin  to Kadcyla (her insurance denied Herceptin  Hylecta and patient does not want to receive it in IV form), stopped Herceptin  (last given 09/17/2022) Neratinib  started 01/05/2023 discontinued 02/08/2023 resumed Herceptin  Hylecta 04/20/2023   Pain control: MS Contin  and MS IR (palliative care is managing her pain)   Hospitalization 03/24/2023-03/27/2023 increased pain in the back: Vertebral mets and compression fractures: Radiation to lumbar spine completed  04/12/2023 06/09/2023: Right lumpectomy: 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 CT angio 06/26/2023: No PE widespread bone metastases.  New subacute pathological fracture Rt sixth and ninth ribs CT CAP 11/05/23: Ext sclerotic and lucent bone lesions, Some more sclerosis, Right iliac bone lesion increasing (3.2 cm)  Bone metastases: On Xgeva  along with calcium  and vitamin D  Recommend obtaining scans and follow-up

## 2024-02-28 NOTE — Progress Notes (Signed)
 Patient Care Team: Cameron Cea, MD as PCP - General (Hematology and Oncology) Alane Hsu, RN as Oncology Nurse Navigator Auther Bo, RN as Oncology Nurse Navigator Murleen Arms, MD as Consulting Physician (Hematology and Oncology) Pickenpack-Cousar, Giles Labrum, NP as Nurse Practitioner [facility] and Palliative Medicine)  DIAGNOSIS:  Encounter Diagnosis  Name Primary?   Malignant neoplasm of upper-inner quadrant of left breast in female, estrogen receptor positive (HCC) Yes    SUMMARY OF ONCOLOGIC HISTORY: Oncology History  Malignant neoplasm of upper-inner quadrant of left breast in female, estrogen receptor positive (HCC)  04/11/2020 Initial Diagnosis   Patient palpated a left breast lump. Mammogram on 03/08/20 showed a 3.0cm mass at the 11 o'clock position with no axillary adenopathy. 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   05/09/2020 Cancer Staging   Staging form: Breast, AJCC 8th Edition - Clinical stage from 05/09/2020: Stage IB (cT2, cN0, cM0, G2, ER+, PR+, HER2+) - Signed by Cameron Cea, MD on 05/09/2020   06/04/2020 Surgery   Left lumpectomy ([name]): invasive and in situ ductal carcinoma, 3.2cm, clear margins, one left axillary lymph node negative for carcinoma.    02/2021 Relapse/Recurrence   Patient went to orthopedics for right hip pain.  Imaging revealed bone metastasis.  CT CAP: Cortical destruction of the right femoral neck consistent with skeletal metastases at risk for pathologic fracture.  Lucent lesion in the left iliac bone and L3 vertebral body consistent with multifocal skeletal metastases.   04/01/2021 Surgery   04/01/2021: Femoral intramedullary nail  Pathology review: Metastatic breast cancer ER 2% PR 0% HER2 3+ positive   04/08/2021 -  Anti-estrogen oral therapy   Tamoxifen  20 mg was prescribed on 04/08/2021 (discontinued by patient because she felt that the risks and  benefits did not support it )--resumed in 07/2022 based on pathology results from right breast biopsy   05/14/2021 -  Chemotherapy   Subcutaneous Herceptin  Perjeta  (Phesgo ) injection started 05/14/2021, switched to Herceptin  Hylecta 06/11/2021 (Fixed drug eruption, profound diarrhea to Phesgo )     05/20/2021 - 06/03/2021 Radiation Therapy   Palliative radiation to the iliac bone and hip   02/04/2022 Imaging   MRI thoracic spine 02/04/2022: Numerous bone metastases thoracic spine cervical and lumbar spines largest T5, T8, T11.  Chronic compression fractures T4, T5, T10 and T12    05/07/2022 Imaging   CT chest abdomen pelvis: No new or progressive bone metastases.  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   07/29/2022 PET scan   IMPRESSION: 1. Hypermetabolic mass in the posterior deep RIGHT breast consistent primary breast carcinoma. 2. Central nodal mediastinal metastasis to the high LEFT prevascular space and LEFT super clavicular node. 3. 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  [name] recommended changing treatment to Kadcyla and Samuel wanted to wait until after the holidays.  She will discuss further with Dr. Raekwan Spelman in January.   11/19/2022 - 11/19/2022 Chemotherapy   Patient is on Treatment Plan : BREAST Trastuzumab  IV (8/6) or SQ (600) D1 q21d     04/20/2023 -  Chemotherapy   Patient is on Treatment Plan : BREAST MAINTENANCE Trastuzumab  IV (6) or SQ (600) D1 q21d x 13 cycles  11/22/2023 - 12/03/2023 Radiation Therapy   Plan Name: Pelvis_R Site: Hip, Right Technique: 3D Mode: Photon Dose Per Fraction: 3 Gy Prescribed Dose (Delivered / Prescribed): 30 Gy / 30 Gy Prescribed Fxs (Delivered / Prescribed): 10 /  10     CHIEF COMPLIANT: Follow-up on Herceptin   HISTORY OF PRESENT ILLNESS:  History of Present Illness Natalie Griffin is a 48 year old female with a history of breast cancer who presents for follow-up and management of her condition.  She has improved her diet, resulting in weight loss from 186 pounds to 140 pounds, with a recent increase to 145 pounds. She has resumed Xgeva  injections and occasionally experiences diarrhea following the injection. She takes three calcium  supplements daily to counteract the effects of Xgeva . She requests a refill for her dissolvable medication, which she uses occasionally, with two tablets remaining. No current issues or symptoms are concerning her.     ALLERGIES:  is allergic to adhesive [tape], fentanyl , phesgo  [pertuz-trastuz-hyaluron-zzxf], tetanus toxoid, and succinylcholine.  MEDICATIONS:  Current Outpatient Medications  Medication Sig Dispense Refill   ALPRAZolam  (XANAX ) 0.5 MG tablet Take 1 tablet (0.5 mg total) by mouth 3 (three) times daily as needed for anxiety. 90 tablet 3   anastrozole  (ARIMIDEX ) 1 MG tablet Take 1 tablet (1 mg total) by mouth daily. 90 tablet 3   diphenoxylate -atropine  (LOMOTIL ) 2.5-0.025 MG tablet Take 1 tablet by mouth 4 (four) times daily as needed for diarrhea or loose stools. 30 tablet 2   methocarbamol  (ROBAXIN ) 500 MG tablet Take 1 tablet (500 mg total) by mouth 3 (three) times daily. 90 tablet 1   morphine  (MS CONTIN ) 60 MG 12 hr tablet Take 1 tablet (60 mg total) by mouth 3 (three) times daily. 90 tablet 0   Multiple Vitamin (ONE DAILY MULTIVITAMIN ADULT PO) Take 1 tablet by mouth daily.     oxyCODONE -acetaminophen  (PERCOCET) 10-325 MG tablet Take 1 tablet by mouth every 4 (four) hours as needed for pain. 90 tablet 0   No current facility-administered medications for this visit.    PHYSICAL EXAMINATION: ECOG PERFORMANCE STATUS: 1 - Symptomatic but completely ambulatory  Vitals:   02/28/24 1450  BP: 132/89   Pulse: (!) 106  Resp: 18  Temp: 98.2 F (36.8 C)  SpO2: 98%   Filed Weights   02/28/24 1450  Weight: 145 lb 4.8 oz (65.9 kg)    Physical Exam MEASUREMENTS: Weight- 145.  (exam performed in the presence of a chaperone)  LABORATORY DATA:  I have reviewed the data as listed    Latest Ref Rng & Units 01/31/2024    2:48 PM 11/12/2023    2:11 PM 10/01/2023    1:46 PM  CMP  Glucose 70 - 99 mg/dL 914  782  956   BUN 6 - 20 mg/dL 12  15  8    Creatinine 0.44 - 1.00 mg/dL 2.13  0.86  5.78   Sodium 135 - 145 mmol/L 140  139  133   Potassium 3.5 - 5.1 mmol/L 3.5  3.7  4.1   Chloride 98 - 111 mmol/L 108  101  101   CO2 22 - 32 mmol/L 25  28  24    Calcium  8.9 - 10.3 mg/dL 8.7  46.9  9.5   Total Protein 6.5 - 8.1 g/dL 7.4  7.4  7.4   Total Bilirubin 0.0 - 1.2 mg/dL 0.3  0.2  0.3   Alkaline Phos 38 - 126 U/L 112  282  247   AST  15 - 41 U/L 21  24  38   ALT 0 - 44 U/L 13  12  25      Lab Results  Component Value Date   WBC 4.8 01/31/2024   HGB 11.5 (L) 01/31/2024   HCT 35.2 (L) 01/31/2024   MCV 88.0 01/31/2024   PLT 266 01/31/2024   NEUTROABS 3.2 01/31/2024    ASSESSMENT & PLAN:  Malignant neoplasm of upper-inner quadrant of left breast in female, estrogen receptor positive (HCC) 06/04/2020:Left lumpectomy ([name]): invasive and in situ ductal carcinoma, 3.2cm, clear margins, one left axillary lymph node negative for carcinoma.  Patient refused adjuvant chemo, adjuvant radiation and adjuvant antiestrogen therapy.   Patient went to orthopedics for right hip pain.  Imaging revealed bone metastasis.  CT CAP: Cortical destruction of the right femoral neck consistent with skeletal metastases at risk for pathologic fracture.  Lucent lesion in the left iliac bone and L3 vertebral body consistent with multifocal skeletal metastases. 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%, HER2 3+ positive, Ki-67 35%    Treatment plan change: Oophorectomy: 09/30/2022 Current treatment: Anastrozole , Herceptin  Hylecta Patient refused switching from Herceptin  to Kadcyla (her insurance denied Herceptin  Hylecta and patient does not want to receive it in IV form), stopped Herceptin  (last given 09/17/2022) Neratinib  started 01/05/2023 discontinued 02/08/2023 resumed Herceptin  Hylecta 04/20/2023   Pain control: MS Contin  and MS IR (palliative care is managing her pain)   Hospitalization 03/24/2023-03/27/2023 increased pain in the back: Vertebral mets and compression fractures: Radiation to lumbar spine completed 04/12/2023 06/09/2023: Right lumpectomy: 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 CT angio 06/26/2023: No PE widespread bone metastases.  New subacute pathological fracture Rt sixth and ninth ribs CT CAP 11/05/23: Ext sclerotic and lucent bone lesions, Some more sclerosis, Right iliac bone lesion increasing (3.2 cm)  Bone metastases: On Xgeva  along with calcium  and vitamin D  Given the tumor markers coming down we decided to hold off on obtaining another scan at this  time. ------------------------------------- Assessment and Plan Assessment & Plan Malignant neoplasm of upper-inner quadrant of left breast Estrogen receptor-positive breast cancer with bone metastases, well-managed. Tumor markers stable. Prefers to delay CT scan unless markers change or symptoms arise. - Continue current treatment regimen including Xgeva  injections. - Postpone CT scan unless tumor markers change or new symptoms develop. - Schedule echocardiogram for June 30th. - Order labs at the next infusion appointment.  Bone metastases Managed with Xgeva . No new bone pain. Aware of Xgeva  side effects and manages travel accordingly. Increased calcium  intake for bone health. - Continue Xgeva  injections. - Monitor for side effects such as diarrhea. - Ensure adequate calcium  intake.  Calcium  deficiency Low calcium  likely due to Xgeva . Increased  intake and supplements expected to improve levels. - Continue calcium  supplements three times daily. - Monitor calcium  levels at next lab appointment.      No orders of the defined types were placed in this encounter.  The patient has a good understanding of the overall plan. she agrees with it. she will call with any problems that may develop before the next visit here. Total time spent: 30 mins including face to face time and time spent for planning, charting and co-ordination of care   Margert Sheerer, MD 02/28/24

## 2024-03-03 ENCOUNTER — Encounter: Payer: Self-pay | Admitting: Hematology and Oncology

## 2024-03-14 ENCOUNTER — Encounter: Payer: Self-pay | Admitting: Hematology and Oncology

## 2024-03-15 ENCOUNTER — Other Ambulatory Visit: Payer: Self-pay | Admitting: Hematology and Oncology

## 2024-03-15 DIAGNOSIS — G893 Neoplasm related pain (acute) (chronic): Secondary | ICD-10-CM

## 2024-03-15 DIAGNOSIS — Z515 Encounter for palliative care: Secondary | ICD-10-CM

## 2024-03-15 DIAGNOSIS — C7951 Secondary malignant neoplasm of bone: Secondary | ICD-10-CM

## 2024-03-15 MED ORDER — OXYCODONE-ACETAMINOPHEN 5-325 MG PO TABS: 1.0000 | ORAL_TABLET | Freq: Four times a day (QID) | ORAL | 0 refills | Status: DC | PRN

## 2024-03-15 NOTE — Progress Notes (Signed)
 Reduced dose of percocet and sent the prescription

## 2024-03-16 ENCOUNTER — Telehealth: Payer: Self-pay | Admitting: *Deleted

## 2024-03-16 NOTE — Telephone Encounter (Addendum)
 Notified of patient advice request for Percocet needing authorization.  Urgent request sent to CoverMyMeds. Medication Prior Authorization Status  Processed CoverMyMeds KEY: [id number]  Outcome: ApprovedToday  Per [facility] [place] Next   Case ID: [id number]   Effective 03/16/2024 through 02/24/[id number]

## 2024-03-20 ENCOUNTER — Ambulatory Visit (HOSPITAL_COMMUNITY)

## 2024-03-27 ENCOUNTER — Other Ambulatory Visit

## 2024-03-27 ENCOUNTER — Ambulatory Visit

## 2024-03-27 ENCOUNTER — Encounter

## 2024-03-31 ENCOUNTER — Other Ambulatory Visit: Payer: Self-pay

## 2024-03-31 ENCOUNTER — Other Ambulatory Visit: Payer: Self-pay | Admitting: Nurse Practitioner

## 2024-03-31 DIAGNOSIS — Z515 Encounter for palliative care: Secondary | ICD-10-CM

## 2024-03-31 DIAGNOSIS — Z170 Estrogen receptor positive status [ER+]: Secondary | ICD-10-CM

## 2024-03-31 DIAGNOSIS — C7951 Secondary malignant neoplasm of bone: Secondary | ICD-10-CM

## 2024-03-31 DIAGNOSIS — G893 Neoplasm related pain (acute) (chronic): Secondary | ICD-10-CM

## 2024-04-03 ENCOUNTER — Ambulatory Visit

## 2024-04-03 ENCOUNTER — Other Ambulatory Visit: Payer: Self-pay

## 2024-04-03 ENCOUNTER — Other Ambulatory Visit

## 2024-04-03 ENCOUNTER — Other Ambulatory Visit: Payer: Self-pay | Admitting: Nurse Practitioner

## 2024-04-03 ENCOUNTER — Encounter

## 2024-04-03 ENCOUNTER — Other Ambulatory Visit (HOSPITAL_COMMUNITY): Payer: Self-pay

## 2024-04-03 DIAGNOSIS — C50212 Malignant neoplasm of upper-inner quadrant of left female breast: Secondary | ICD-10-CM

## 2024-04-03 DIAGNOSIS — C7951 Secondary malignant neoplasm of bone: Secondary | ICD-10-CM

## 2024-04-03 DIAGNOSIS — G893 Neoplasm related pain (acute) (chronic): Secondary | ICD-10-CM

## 2024-04-03 DIAGNOSIS — Z515 Encounter for palliative care: Secondary | ICD-10-CM

## 2024-04-03 MED ORDER — MORPHINE SULFATE ER 60 MG PO TBCR: 60.0000 mg | EXTENDED_RELEASE_TABLET | Freq: Three times a day (TID) | ORAL | 0 refills | Status: DC

## 2024-04-03 MED ORDER — MORPHINE SULFATE ER 60 MG PO TBCR
60.0000 mg | EXTENDED_RELEASE_TABLET | Freq: Three times a day (TID) | ORAL | 0 refills | Status: DC
  Filled 2024-04-03: qty 90, 30d supply, fill #0

## 2024-04-04 ENCOUNTER — Encounter: Payer: Self-pay | Admitting: Hematology and Oncology

## 2024-04-05 ENCOUNTER — Inpatient Hospital Stay: Attending: Hematology and Oncology

## 2024-04-05 ENCOUNTER — Inpatient Hospital Stay

## 2024-04-06 ENCOUNTER — Other Ambulatory Visit (HOSPITAL_COMMUNITY): Payer: Self-pay

## 2024-04-10 ENCOUNTER — Inpatient Hospital Stay: Attending: Hematology and Oncology

## 2024-04-10 ENCOUNTER — Inpatient Hospital Stay

## 2024-04-10 VITALS — BP 110/75 | HR 89 | Temp 98.1°F | Resp 16 | Wt 144.5 lb

## 2024-04-10 DIAGNOSIS — Z5112 Encounter for antineoplastic immunotherapy: Secondary | ICD-10-CM | POA: Insufficient documentation

## 2024-04-10 DIAGNOSIS — C50212 Malignant neoplasm of upper-inner quadrant of left female breast: Secondary | ICD-10-CM

## 2024-04-10 DIAGNOSIS — C7951 Secondary malignant neoplasm of bone: Secondary | ICD-10-CM

## 2024-04-10 LAB — CBC WITH DIFFERENTIAL (CANCER CENTER ONLY)
Abs Immature Granulocytes: 0.02 K/uL (ref 0.00–0.07)
Basophils Absolute: 0.1 K/uL (ref 0.0–0.1)
Basophils Relative: 1 %
Eosinophils Absolute: 0.6 K/uL — ABNORMAL HIGH (ref 0.0–0.5)
Eosinophils Relative: 9 %
HCT: 33.6 % — ABNORMAL LOW (ref 36.0–46.0)
Hemoglobin: 10.8 g/dL — ABNORMAL LOW (ref 12.0–15.0)
Immature Granulocytes: 0 %
Lymphocytes Relative: 12 %
Lymphs Abs: 0.7 K/uL (ref 0.7–4.0)
MCH: 28.4 pg (ref 26.0–34.0)
MCHC: 32.1 g/dL (ref 30.0–36.0)
MCV: 88.4 fL (ref 80.0–100.0)
Monocytes Absolute: 0.7 K/uL (ref 0.1–1.0)
Monocytes Relative: 11 %
Neutro Abs: 4.1 K/uL (ref 1.7–7.7)
Neutrophils Relative %: 67 %
Platelet Count: 229 K/uL (ref 150–400)
RBC: 3.8 MIL/uL — ABNORMAL LOW (ref 3.87–5.11)
RDW: 13.8 % (ref 11.5–15.5)
WBC Count: 6.1 K/uL (ref 4.0–10.5)
nRBC: 0 % (ref 0.0–0.2)

## 2024-04-10 LAB — CMP (CANCER CENTER ONLY)
ALT: 14 U/L (ref 0–44)
AST: 21 U/L (ref 15–41)
Albumin: 4.2 g/dL (ref 3.5–5.0)
Alkaline Phosphatase: 69 U/L (ref 38–126)
Anion gap: 6 (ref 5–15)
BUN: 14 mg/dL (ref 6–20)
CO2: 25 mmol/L (ref 22–32)
Calcium: 9.5 mg/dL (ref 8.9–10.3)
Chloride: 105 mmol/L (ref 98–111)
Creatinine: 0.71 mg/dL (ref 0.44–1.00)
GFR, Estimated: >60 mL/min (ref >60)
Glucose, Bld: 96 mg/dL (ref 70–99)
Potassium: 4.3 mmol/L (ref 3.5–5.1)
Sodium: 136 mmol/L (ref 135–145)
Total Bilirubin: 0.3 mg/dL (ref 0.0–1.2)
Total Protein: 7.1 g/dL (ref 6.5–8.1)

## 2024-04-10 MED ORDER — TRASTUZUMAB-HYALURONIDASE-OYSK 600-10000 MG-UNT/5ML ~~LOC~~ SOLN
600.0000 mg | Freq: Once | SUBCUTANEOUS | Status: AC
  Administered 2024-04-10: 600 mg via SUBCUTANEOUS
  Filled 2024-04-10: qty 5

## 2024-04-10 NOTE — Patient Instructions (Signed)
 [facility] - A DEPT OF . [facility]  Discharge Instructions: Thank you for choosing [facility] to provide your oncology and hematology care.   If you have a lab appointment with the Cancer Center, please go directly to the Cancer Center and check in at the registration area.   Wear comfortable clothing and clothing appropriate for easy access to any Portacath or PICC line.   We strive to give you quality time with your provider. You may need to reschedule your appointment if you arrive late (15 or more minutes).  Arriving late affects you and other patients whose appointments are after yours.  Also, if you miss three or more appointments without notifying the office, you may be dismissed from the clinic at the provider's discretion.      For prescription refill requests, have your pharmacy contact our office and allow 72 hours for refills to be completed.    Today you received the following chemotherapy and/or immunotherapy agents herceptin  hylecta      To help prevent nausea and vomiting after your treatment, we encourage you to take your nausea medication as directed.  BELOW ARE SYMPTOMS THAT SHOULD BE REPORTED IMMEDIATELY: *FEVER GREATER THAN 100.4 F (38 C) OR HIGHER *CHILLS OR SWEATING *NAUSEA AND VOMITING THAT IS NOT CONTROLLED WITH YOUR NAUSEA MEDICATION *UNUSUAL SHORTNESS OF BREATH *UNUSUAL BRUISING OR BLEEDING *URINARY PROBLEMS (pain or burning when urinating, or frequent urination) *BOWEL PROBLEMS (unusual diarrhea, constipation, pain near the anus) TENDERNESS IN MOUTH AND THROAT WITH OR WITHOUT PRESENCE OF ULCERS (sore throat, sores in mouth, or a toothache) UNUSUAL RASH, SWELLING OR PAIN  UNUSUAL VAGINAL DISCHARGE OR ITCHING   Items with * indicate a potential emergency and should be followed up as soon as possible or go to the Emergency Department if any problems should occur.  Please show the CHEMOTHERAPY ALERT CARD or  IMMUNOTHERAPY ALERT CARD at check-in to the Emergency Department and triage nurse.  Should you have questions after your visit or need to cancel or reschedule your appointment, please contact [facility] - A DEPT OF [facility]  Dept: [phone]  and follow the prompts.  Office hours are 8:00 a.m. to 4:30 p.m. Monday - Friday. Please note that voicemails left after 4:00 p.m. may not be returned until the following business day.  We are closed weekends and major holidays. You have access to a nurse at all times for urgent questions. Please call the main number to the clinic Dept: [phone] and follow the prompts.   For any non-urgent questions, you may also contact your provider using MyChart. We now offer e-Visits for anyone 75 and older to request care online for non-urgent symptoms. For details visit [email or web address].   Also download the MyChart app! Go to the app store, search "MyChart", open the app, select [facility], and log in with your MyChart username and password.

## 2024-04-11 ENCOUNTER — Other Ambulatory Visit (HOSPITAL_COMMUNITY): Payer: Self-pay

## 2024-04-11 LAB — CANCER ANTIGEN 27.29: CA 27.29: 795.6 U/mL — ABNORMAL HIGH (ref 0.0–38.6)

## 2024-04-12 ENCOUNTER — Inpatient Hospital Stay

## 2024-04-13 ENCOUNTER — Ambulatory Visit: Admitting: Family Medicine

## 2024-04-13 ENCOUNTER — Ambulatory Visit (HOSPITAL_COMMUNITY)
Admission: RE | Admit: 2024-04-13 | Discharge: 2024-04-13 | Disposition: A | Discharge status: Home / Self Care | Source: Ambulatory Visit | Attending: Hematology and Oncology | Admitting: Hematology and Oncology

## 2024-04-13 ENCOUNTER — Encounter: Payer: Self-pay | Admitting: Hematology and Oncology

## 2024-04-13 ENCOUNTER — Encounter: Payer: Self-pay | Admitting: Family Medicine

## 2024-04-13 ENCOUNTER — Other Ambulatory Visit: Payer: Self-pay

## 2024-04-13 ENCOUNTER — Other Ambulatory Visit (HOSPITAL_COMMUNITY): Payer: Self-pay

## 2024-04-13 VITALS — BP 122/84 | HR 78 | Temp 98.7°F | Resp 18 | Ht 61.0 in | Wt 143.0 lb

## 2024-04-13 DIAGNOSIS — Z0289 Encounter for other administrative examinations: Secondary | ICD-10-CM | POA: Insufficient documentation

## 2024-04-13 DIAGNOSIS — Z79899 Other long term (current) drug therapy: Secondary | ICD-10-CM | POA: Diagnosis not present

## 2024-04-13 DIAGNOSIS — F902 Attention-deficit hyperactivity disorder, combined type: Secondary | ICD-10-CM | POA: Diagnosis not present

## 2024-04-13 DIAGNOSIS — I517 Cardiomegaly: Secondary | ICD-10-CM | POA: Insufficient documentation

## 2024-04-13 DIAGNOSIS — Z7689 Persons encountering health services in other specified circumstances: Secondary | ICD-10-CM | POA: Diagnosis not present

## 2024-04-13 DIAGNOSIS — Z5181 Encounter for therapeutic drug level monitoring: Secondary | ICD-10-CM | POA: Insufficient documentation

## 2024-04-13 MED ORDER — AMPHETAMINE-DEXTROAMPHETAMINE 10 MG PO TABS
10.0000 mg | ORAL_TABLET | Freq: Two times a day (BID) | ORAL | 0 refills | Status: DC
  Filled 2024-04-13: qty 60, 30d supply, fill #0

## 2024-04-13 NOTE — Progress Notes (Signed)
 New Patient Office Visit  Subjective    Patient ID: Natalie Griffin, female    DOB: May 01, 1976  Age: 48 y.o. MRN: [id number]  CC:  Chief Complaint  Patient presents with   Medication Management    Wants to get refill/prescription for adderall .    HPI Natalie Griffin presents to establish care with this practice. She is new to me. Has been seeing different PCP where she was prescribed adderall for ADHD. Prescribed Adderall 10 mg twice per day. Symptoms are well controlled with this dose.  Has not been prescribed in 6 months.  Not on PDMP.   Under care under palliative care with [facility]. Has metastatic cancer.  On morphine , percocet,  medical marijuana, and  xanax .  Has copy of medical marijuana form.  Will give copy for this provider.      Outpatient Encounter Medications as of 04/13/2024  Medication Sig   ALPRAZolam  (XANAX ) 0.5 MG tablet Take 1 tablet (0.5 mg total) by mouth 3 (three) times daily as needed for anxiety.   amphetamine -dextroamphetamine  (ADDERALL) 10 MG tablet Take 1 tablet (10 mg total) by mouth 2 (two) times daily.   methocarbamol  (ROBAXIN ) 500 MG tablet Take 1 tablet (500 mg total) by mouth 3 (three) times daily.   morphine  (MS CONTIN ) 60 MG 12 hr tablet Take 1 tablet (60 mg total) by mouth 3 (three) times daily.   Multiple Vitamin (ONE DAILY MULTIVITAMIN ADULT PO) Take 1 tablet by mouth daily.   ondansetron  (ZOFRAN -ODT) 4 MG disintegrating tablet Take 1 tablet (4 mg total) by mouth every 8 (eight) hours as needed for nausea or vomiting.   oxyCODONE -acetaminophen  (PERCOCET/ROXICET) 5-325 MG tablet Take 1 tablet by mouth every 6 (six) hours as needed for severe pain (pain score 7-10).   anastrozole  (ARIMIDEX ) 1 MG tablet Take 1 tablet (1 mg total) by mouth daily. (Patient not taking: Reported on 04/13/2024)   [DISCONTINUED] diphenoxylate -atropine  (LOMOTIL ) 2.5-0.025 MG tablet Take 1 tablet by mouth 4 (four) times daily as needed for diarrhea or loose stools.    No facility-administered encounter medications on file as of 04/13/2024.    Past Medical History:  Diagnosis Date   Anemia    Anxiety    Bone metastasis    Breast cancer (HCC) 2021   Left breast invasive ductal carcinoma   Cancer (HCC) 2021   Left breast   Complication of anesthesia    pseudocholinesterase deficiency   Family history of adverse reaction to anesthesia    Father and Aunt have pseudocholinesterase deficiency - Trouble waking up from anesthesia   History of hiatal hernia    History of kidney stones    noted on CT Left nonobstructive   Pseudocholinesterase deficiency     Past Surgical History:  Procedure Laterality Date   BREAST BIOPSY Right 07/21/2022   US  RT BREAST BX W LOC DEV 1ST LESION IMG BX SPEC US  GUIDE 07/21/2022 [facility]   BREAST BIOPSY  06/08/2023   MM RT RADIOACTIVE SEED LOC MAMMO GUIDE 06/08/2023 [facility]   BREAST LUMPECTOMY WITH RADIOACTIVE SEED AND SENTINEL LYMPH NODE BIOPSY Left 06/04/2020   Procedure: LEFT BREAST LUMPECTOMY WITH RADIOACTIVE SEED AND SENTINEL LYMPH NODE MAPPING;  Surgeon: Vanderbilt Ned, MD;  Location: [facility];  Service: General;  Laterality: Left;  PEC BLOCK, RNFA   BREAST LUMPECTOMY WITH RADIOACTIVE SEED LOCALIZATION Right 06/09/2023   Procedure: RIGHT BREAST LUMPECTOMY WITH RADIOACTIVE SEED LOCALIZATION;  Surgeon: Vanderbilt Ned, MD;  Location: [facility];  Service:  General;  Laterality: Right;   FEMUR IM NAIL Right 04/01/2021   Procedure: INTRAMEDULLARY (IM) NAIL FEMORAL;  Surgeon: Beverley Evalene BIRCH, MD;  Location: [facility];  Service: Orthopedics;  Laterality: Right;   ROBOTIC ASSISTED BILATERAL SALPINGO OOPHERECTOMY Bilateral 09/30/2022   Procedure: XI ROBOTIC ASSISTED BILATERAL SALPINGO OOPHORECTOMY;  Surgeon: Viktoria Comer SAUNDERS, MD;  Location: [facility];  Service: Gynecology;  Laterality: Bilateral;   WISDOM TOOTH EXTRACTION      Family History  Problem Relation Age of Onset   Colon cancer Neg Hx     Esophageal cancer Neg Hx    Stomach cancer Neg Hx    Rectal cancer Neg Hx     Social History   Socioeconomic History   Marital status: Single    Spouse name: Not on file   Number of children: Not on file   Years of education: Not on file   Highest education level: Associate degree: academic program  Occupational History   Not on file  Tobacco Use   Smoking status: Former    Current packs/day: 0.00    Types: Cigarettes    Quit date: 05/19/2020    Years since quitting: 3.9   Smokeless tobacco: Never  Vaping Use   Vaping status: Never Used  Substance and Sexual Activity   Alcohol use: Not Currently   Drug use: Never   Sexual activity: Not Currently  Other Topics Concern   Not on file  Social History Narrative   Not on file   Social Drivers of Health   Financial Resource Strain: Medium Risk (04/13/2024)   Overall Financial Resource Strain (CARDIA)    Difficulty of Paying Living Expenses: Somewhat hard  Food Insecurity: Food Insecurity Present (04/13/2024)   Hunger Vital Sign    Worried About Running Out of Food in the Last Year: Sometimes true    Ran Out of Food in the Last Year: Sometimes true  Transportation Needs: No Transportation Needs (04/13/2024)   PRAPARE - Administrator, Civil Service (Medical): No    Lack of Transportation (Non-Medical): No  Physical Activity: Insufficiently Active (01/31/2024)   Exercise Vital Sign    Days of Exercise per Week: 4 days    Minutes of Exercise per Session: 30 min  Stress: No Stress Concern Present (04/13/2024)   [employer] of Occupational Health - Occupational Stress Questionnaire    Feeling of Stress: Only a little  Social Connections: Moderately Integrated (04/13/2024)   Social Connection and Isolation Panel    Frequency of Communication with Friends and Family: More than three times a week    Frequency of Social Gatherings with Friends and Family: Once a week    Attends Religious Services: 1 to 4 times  per year    Active Member of [employer] or Organizations: Yes    Attends Banker Meetings: 1 to 4 times per year    Marital Status: Never married  Intimate Partner Violence: Not At Risk (04/13/2024)   Humiliation, Afraid, Rape, and Kick questionnaire    Fear of Current or Ex-Partner: No    Emotionally Abused: No    Physically Abused: No    Sexually Abused: No    ROS      Objective    BP 122/84 (BP Location: Left Arm, Patient Position: Sitting, Cuff Size: Small)   Pulse 78   Temp 98.7 F (37.1 C) (Oral)   Resp 18   Ht 5' 1 (1.549 m)   Wt 143 lb (64.9 kg)  LMP  (LMP Unknown)   SpO2 97%   BMI 27.02 kg/m   Physical Exam Vitals and nursing note reviewed.  Constitutional:      General: She is not in acute distress.    Appearance: Normal appearance. She is normal weight.  Cardiovascular:     Rate and Rhythm: Normal rate.  Pulmonary:     Effort: Pulmonary effort is normal.  Skin:    General: Skin is warm and dry.  Neurological:     General: No focal deficit present.     Mental Status: She is alert. Mental status is at baseline.  Psychiatric:        Mood and Affect: Mood normal.        Behavior: Behavior normal.        Thought Content: Thought content normal.        Judgment: Judgment normal.         Assessment & Plan:   Problem List Items Addressed This Visit     Establishing care with new doctor, encounter for - Primary   Attention deficit hyperactivity disorder (ADHD), combined type   Prescribed Adderall 10 mg BID by previous provider. She has not had medication for 6 months. Urine tox screen today. She is on several controlled pain medications for metastatic cancer including medical marijuana. She has a copy of this form with her today, she will get a copy for our office.  Contract signed today. Adderall 10 mg BID.  Follow-up in 3 months.       Relevant Medications   amphetamine -dextroamphetamine  (ADDERALL) 10 MG tablet   Other Relevant  Orders   ToxASSURE Select 13 (MW), Urine   Encounter for completion of form with patient   Relevant Medications   amphetamine -dextroamphetamine  (ADDERALL) 10 MG tablet   Other Relevant Orders   ToxASSURE Select 13 (MW), Urine  Agrees with plan of care discussed.  Questions answered.   Return in about 3 months (around 07/14/2024) for ADHD med managment.   Natalie JONELLE Brownie, FNP

## 2024-04-13 NOTE — Assessment & Plan Note (Addendum)
 Prescribed Adderall 10 mg BID by previous provider. She has not had medication for 6 months. Urine tox screen today. She is on several controlled pain medications for metastatic cancer including medical marijuana. She has a copy of this form with her today, she will get a copy for our office.  Contract signed today. Adderall 10 mg BID.  Follow-up in 3 months.

## 2024-04-15 LAB — ECHOCARDIOGRAM COMPLETE
AR max vel: 2.8 cm2
AV Area VTI: 2.83 cm2
AV Area mean vel: 2.69 cm2
AV Mean grad: 4 mmHg
AV Peak grad: 7.3 mmHg
Ao pk vel: 1.35 m/s
Area-P 1/2: 3.38 cm2
Height: 61 in
S' Lateral: 3.1 cm
Weight: 2288 [oz_av]

## 2024-04-17 ENCOUNTER — Other Ambulatory Visit: Payer: Self-pay | Admitting: Hematology and Oncology

## 2024-04-17 LAB — TOXASSURE SELECT 13 (MW), URINE

## 2024-04-18 ENCOUNTER — Encounter: Payer: Self-pay | Admitting: Hematology and Oncology

## 2024-04-23 ENCOUNTER — Encounter: Payer: Self-pay | Admitting: Hematology and Oncology

## 2024-04-24 ENCOUNTER — Inpatient Hospital Stay

## 2024-04-28 ENCOUNTER — Encounter: Payer: Self-pay | Admitting: Hematology and Oncology

## 2024-05-01 ENCOUNTER — Other Ambulatory Visit: Payer: Self-pay | Admitting: Nurse Practitioner

## 2024-05-01 ENCOUNTER — Other Ambulatory Visit (HOSPITAL_COMMUNITY): Payer: Self-pay

## 2024-05-01 DIAGNOSIS — Z515 Encounter for palliative care: Secondary | ICD-10-CM

## 2024-05-01 DIAGNOSIS — G893 Neoplasm related pain (acute) (chronic): Secondary | ICD-10-CM

## 2024-05-01 DIAGNOSIS — C7951 Secondary malignant neoplasm of bone: Secondary | ICD-10-CM

## 2024-05-01 DIAGNOSIS — C50212 Malignant neoplasm of upper-inner quadrant of left female breast: Secondary | ICD-10-CM

## 2024-05-01 MED ORDER — MORPHINE SULFATE ER 60 MG PO TBCR
60.0000 mg | EXTENDED_RELEASE_TABLET | Freq: Three times a day (TID) | ORAL | 0 refills | Status: DC
  Filled 2024-05-01: qty 90, 30d supply, fill #0

## 2024-05-02 ENCOUNTER — Other Ambulatory Visit (HOSPITAL_COMMUNITY): Payer: Self-pay

## 2024-05-02 ENCOUNTER — Encounter: Payer: Self-pay | Admitting: Hematology and Oncology

## 2024-05-03 ENCOUNTER — Other Ambulatory Visit: Payer: Self-pay | Admitting: Nurse Practitioner

## 2024-05-03 ENCOUNTER — Encounter: Payer: Self-pay | Admitting: Hematology and Oncology

## 2024-05-03 ENCOUNTER — Other Ambulatory Visit (HOSPITAL_COMMUNITY): Payer: Self-pay

## 2024-05-03 ENCOUNTER — Encounter (HOSPITAL_COMMUNITY): Payer: Self-pay

## 2024-05-03 DIAGNOSIS — Z515 Encounter for palliative care: Secondary | ICD-10-CM

## 2024-05-03 DIAGNOSIS — C50212 Malignant neoplasm of upper-inner quadrant of left female breast: Secondary | ICD-10-CM

## 2024-05-03 DIAGNOSIS — C7951 Secondary malignant neoplasm of bone: Secondary | ICD-10-CM

## 2024-05-03 DIAGNOSIS — G893 Neoplasm related pain (acute) (chronic): Secondary | ICD-10-CM

## 2024-05-03 MED ORDER — MORPHINE SULFATE ER 30 MG PO TBCR: 60.0000 mg | EXTENDED_RELEASE_TABLET | Freq: Two times a day (BID) | ORAL | 0 refills | Status: DC

## 2024-05-03 MED ORDER — MORPHINE SULFATE ER 30 MG PO TBCR
60.0000 mg | EXTENDED_RELEASE_TABLET | Freq: Three times a day (TID) | ORAL | 0 refills | Status: DC
  Filled 2024-05-03 (×3): qty 90, 15d supply, fill #0

## 2024-05-04 ENCOUNTER — Other Ambulatory Visit (HOSPITAL_COMMUNITY): Payer: Self-pay

## 2024-05-08 ENCOUNTER — Inpatient Hospital Stay (HOSPITAL_BASED_OUTPATIENT_CLINIC_OR_DEPARTMENT_OTHER): Admitting: Nurse Practitioner

## 2024-05-08 ENCOUNTER — Inpatient Hospital Stay: Attending: Hematology and Oncology

## 2024-05-08 ENCOUNTER — Encounter: Payer: Self-pay | Admitting: Hematology and Oncology

## 2024-05-08 VITALS — BP 128/96 | HR 94 | Temp 98.1°F | Resp 16 | Wt 137.0 lb

## 2024-05-08 DIAGNOSIS — Z5112 Encounter for antineoplastic immunotherapy: Secondary | ICD-10-CM | POA: Diagnosis present

## 2024-05-08 DIAGNOSIS — Z170 Estrogen receptor positive status [ER+]: Secondary | ICD-10-CM | POA: Diagnosis not present

## 2024-05-08 DIAGNOSIS — C50212 Malignant neoplasm of upper-inner quadrant of left female breast: Secondary | ICD-10-CM | POA: Insufficient documentation

## 2024-05-08 DIAGNOSIS — M792 Neuralgia and neuritis, unspecified: Secondary | ICD-10-CM | POA: Diagnosis not present

## 2024-05-08 DIAGNOSIS — Z515 Encounter for palliative care: Secondary | ICD-10-CM

## 2024-05-08 DIAGNOSIS — C7951 Secondary malignant neoplasm of bone: Secondary | ICD-10-CM | POA: Insufficient documentation

## 2024-05-08 DIAGNOSIS — G893 Neoplasm related pain (acute) (chronic): Secondary | ICD-10-CM | POA: Diagnosis not present

## 2024-05-08 DIAGNOSIS — R110 Nausea: Secondary | ICD-10-CM

## 2024-05-08 MED ORDER — SODIUM CHLORIDE 0.9 % IV SOLN: Freq: Once | INTRAVENOUS | Status: DC

## 2024-05-08 MED ORDER — DENOSUMAB 120 MG/1.7ML ~~LOC~~ SOLN
120.0000 mg | Freq: Once | SUBCUTANEOUS | Status: AC
  Administered 2024-05-08: 120 mg via SUBCUTANEOUS
  Filled 2024-05-08: qty 1.7

## 2024-05-08 MED ORDER — TRASTUZUMAB-HYALURONIDASE-OYSK 600-10000 MG-UNT/5ML ~~LOC~~ SOLN
600.0000 mg | Freq: Once | SUBCUTANEOUS | Status: AC
  Administered 2024-05-08: 600 mg via SUBCUTANEOUS
  Filled 2024-05-08: qty 5

## 2024-05-08 NOTE — Patient Instructions (Signed)
 [facility] - A DEPT OF [facility]  Discharge Instructions: Thank you for choosing [facility] to provide your oncology and hematology care.   If you have a lab appointment with the Cancer Center, please go directly to the Cancer Center and check in at the registration area.   Wear comfortable clothing and clothing appropriate for easy access to any Portacath or PICC line.   We strive to give you quality time with your provider. You may need to reschedule your appointment if you arrive late (15 or more minutes).  Arriving late affects you and other patients whose appointments are after yours.  Also, if you miss three or more appointments without notifying the office, you may be dismissed from the clinic at the provider's discretion.      For prescription refill requests, have your pharmacy contact our office and allow 72 hours for refills to be completed.    Today you received the following chemotherapy and/or immunotherapy agents: Herceptin Hylecta.       To help prevent nausea and vomiting after your treatment, we encourage you to take your nausea medication as directed.  BELOW ARE SYMPTOMS THAT SHOULD BE REPORTED IMMEDIATELY: *FEVER GREATER THAN 100.4 F (38 C) OR HIGHER *CHILLS OR SWEATING *NAUSEA AND VOMITING THAT IS NOT CONTROLLED WITH YOUR NAUSEA MEDICATION *UNUSUAL SHORTNESS OF BREATH *UNUSUAL BRUISING OR BLEEDING *URINARY PROBLEMS (pain or burning when urinating, or frequent urination) *BOWEL PROBLEMS (unusual diarrhea, constipation, pain near the anus) TENDERNESS IN MOUTH AND THROAT WITH OR WITHOUT PRESENCE OF ULCERS (sore throat, sores in mouth, or a toothache) UNUSUAL RASH, SWELLING OR PAIN  UNUSUAL VAGINAL DISCHARGE OR ITCHING   Items with * indicate a potential emergency and should be followed up as soon as possible or go to the Emergency Department if any problems should occur.  Please show the CHEMOTHERAPY ALERT CARD or  IMMUNOTHERAPY ALERT CARD at check-in to the Emergency Department and triage nurse.  Should you have questions after your visit or need to cancel or reschedule your appointment, please contact [facility] - A DEPT OF [facility]  Dept: [phone]  and follow the prompts.  Office hours are 8:00 a.m. to 4:30 p.m. Monday - Friday. Please note that voicemails left after 4:00 p.m. may not be returned until the following business day.  We are closed weekends and major holidays. You have access to a nurse at all times for urgent questions. Please call the main number to the clinic Dept: [phone] and follow the prompts.   For any non-urgent questions, you may also contact your provider using MyChart. We now offer e-Visits for anyone 31 and older to request care online for non-urgent symptoms. For details visit [email or web address].   Also download the MyChart app! Go to the app store, search "MyChart", open the app, select [place], and log in with your MyChart username and password.

## 2024-05-08 NOTE — Progress Notes (Signed)
 Ok to proceed with Xgeva  today using labs from 7/21, Ca=9.5 per Dr. Odean.  Jordynn Marcella, PharmD, MBA

## 2024-05-09 ENCOUNTER — Other Ambulatory Visit (HOSPITAL_COMMUNITY): Payer: Self-pay

## 2024-05-09 MED ORDER — GABAPENTIN 300 MG PO CAPS
300.0000 mg | ORAL_CAPSULE | Freq: Two times a day (BID) | ORAL | 2 refills | Status: DC
  Filled 2024-05-09: qty 60, 30d supply, fill #0
  Filled 2024-06-06 – 2024-06-27 (×4): qty 60, 30d supply, fill #1

## 2024-05-10 NOTE — Progress Notes (Signed)
 Palliative Medicine [facility]  [phone] [phone]   Name: Natalie Griffin Date: 05/10/2024 MRN: [id number]  DOB: [birth year]  Patient Care Team: Booker Darice SAUNDERS, FNP as PCP - General (Family Medicine) Tyree Nanetta SAILOR, RN as Oncology Nurse Navigator Glean, Stephane BROCKS, RN (Inactive) as Oncology Nurse Navigator Loretha Ash, MD as Consulting Physician (Hematology and Oncology) Pickenpack-Cousar, Fannie SAILOR, NP as Nurse Practitioner (Hospice and Palliative Medicine)    INTERVAL HISTORY: Natalie Griffin is a 48 y.o. 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 Palliative ask to see for symptom management and goals of care.   SOCIAL HISTORY:     reports that she quit smoking about 3 years ago. Her smoking use included cigarettes. She has never used smokeless tobacco. She reports that she does not currently use alcohol. She reports that she does not use drugs.  ADVANCE DIRECTIVES:  Advanced directives on file naming Natalie Griffin as patient healthcare power of attorney if patient becomes unable to speak for themselves.   CODE STATUS: DNR  PAST MEDICAL HISTORY: Past Medical History:  Diagnosis Date   Anemia    Anxiety    Bone metastasis    Breast cancer (HCC) 2021   Left breast invasive ductal carcinoma   Cancer (HCC) 2021   Left breast   Complication of anesthesia    pseudocholinesterase deficiency   Family history of adverse reaction to anesthesia    Father and Aunt have pseudocholinesterase deficiency - Trouble waking up from anesthesia   History of hiatal hernia    History of kidney stones    noted on CT Left nonobstructive   Pseudocholinesterase deficiency     ALLERGIES:  is allergic to adhesive [tape], fentanyl , phesgo  [pertuz-trastuz-hyaluron-zzxf], tetanus toxoid, and succinylcholine.  MEDICATIONS:  Current Outpatient Medications   Medication Sig Dispense Refill   gabapentin  (NEURONTIN ) 300 MG capsule Take 1 capsule (300 mg total) by mouth 2 (two) times daily. 60 capsule 2   ALPRAZolam  (XANAX ) 0.5 MG tablet Take 1 tablet (0.5 mg total) by mouth 3 (three) times daily as needed for anxiety. 90 tablet 3   amphetamine -dextroamphetamine  (ADDERALL) 10 MG tablet Take 1 tablet (10 mg total) by mouth 2 (two) times daily. 60 tablet 0   anastrozole  (ARIMIDEX ) 1 MG tablet Take 1 tablet (1 mg total) by mouth daily. (Patient not taking: Reported on 04/13/2024) 90 tablet 3   methocarbamol  (ROBAXIN ) 500 MG tablet Take 1 tablet (500 mg total) by mouth 3 (three) times daily. 90 tablet 1   morphine  (MS CONTIN ) 30 MG 12 hr tablet Take 2 tablets (60 mg total) by mouth 3 (three) times daily. 90 tablet 0   [START ON 05/16/2024] morphine  (MS CONTIN ) 30 MG 12 hr tablet Take 2 tablets (60 mg total) by mouth every 12 (twelve) hours. 90 tablet 0   Multiple Vitamin (ONE DAILY MULTIVITAMIN ADULT PO) Take 1 tablet by mouth daily.     ondansetron  (ZOFRAN -ODT) 4 MG disintegrating tablet Take 1 tablet (4 mg total) by mouth every 8 (eight) hours as needed for nausea or vomiting. 20 tablet 3   oxyCODONE -acetaminophen  (PERCOCET/ROXICET) 5-325 MG tablet Take 1 tablet by mouth every 6 (six) hours as needed for severe pain (pain score 7-10). 60 tablet 0   No current facility-administered medications for this visit.    VITAL SIGNS: LMP  (LMP Unknown)  There were no vitals filed for this visit.  Estimated body mass index is 25.89 kg/m as calculated from the following:   Height as of 04/13/24: 5' 1 (1.549 m).   Weight as of an earlier encounter on 05/08/24: 137 lb (62.1 kg).   PERFORMANCE STATUS (ECOG) : 1 - Symptomatic but completely ambulatory   Physical Exam General: NAD Cardiovascular: regular rate and rhythm Pulmonary: normal breathing pattern Extremities: no edema, no joint deformities, nodule to spine area. Skin: no rashes Neurological: AAO  x3  IMPRESSION: Discussed the use of AI scribe software for clinical note transcription with the patient, who gave verbal consent to proceed.  History of Present Illness Natalie Griffin is a 48 year old female who was seen during infusion for symptom management follow-up. No acute distress noted.  Patient is doing well overall.  Appetite is good. Weight is stable at 137lbs.  Occasional fatigue however patient is remaining as active as possible.    Natalie Griffin reports her pain is well-controlled on current regimen.  Unfortunately MS Contin  60 mg is on a nationwide backorder and patient is having to take MS Contin  30 mg 2 tablets every 12 hours.  Patient is reporting due to change in medication which includes coated pills this has irritated her stomach causing increase in nausea.  She has Zofran  available and is taking as needed however is hopeful that her current medication will be restocked soon.  We attempted to call around to all pharmacies to confirm medication is not readily available with no luck.  No changes to current regimen at this time.  Patient is not requiring breakthrough medication.  Expressed hopes of being able to wean down medication in the near future.  Natalie Griffin is concerned about protruding nodule to her spine.  Will anxiously await upcoming scans as managed by oncology to determine if there are any changes.  All questions answered and support provided.  Assessment & Plan Cancer related pain management  Pain well-managed with morphine  and Percocet.  No adjustments to current regimen at this time.  Pain is much improved allowing decreased use in her extended release. - Continue MS Contin  60 mg taking 230 mg tablets every 12 hours. - Patient is not requiring breakthrough pain medication. -Continue antiemetics for nausea and vomiting. - Will continue to closely monitor and adjust regimen as needed.  I will plan to see patient back in 4-6 weeks.  Sooner if needed.   Patient  expressed understanding and was in agreement with this plan. She also understands that She can call the clinic at any time with any questions, concerns, or complaints.   Any controlled substances utilized were prescribed in the context of palliative care. PDMP has been reviewed.   Visit consisted of counseling and education dealing with the complex and emotionally intense issues of symptom management and palliative care in the setting of serious and potentially life-threatening illness.  Levon Borer, AGPCNP-BC  Palliative Medicine Team/[facility]

## 2024-05-11 ENCOUNTER — Encounter: Payer: Self-pay | Admitting: Nurse Practitioner

## 2024-05-11 ENCOUNTER — Encounter: Payer: Self-pay | Admitting: Hematology and Oncology

## 2024-05-12 ENCOUNTER — Telehealth: Payer: Self-pay

## 2024-05-12 ENCOUNTER — Encounter: Payer: Self-pay | Admitting: Family Medicine

## 2024-05-12 ENCOUNTER — Other Ambulatory Visit (HOSPITAL_COMMUNITY): Payer: Self-pay

## 2024-05-12 ENCOUNTER — Other Ambulatory Visit: Payer: Self-pay

## 2024-05-12 ENCOUNTER — Encounter: Payer: Self-pay | Admitting: Hematology and Oncology

## 2024-05-12 ENCOUNTER — Other Ambulatory Visit: Payer: Self-pay | Admitting: Family Medicine

## 2024-05-12 ENCOUNTER — Encounter (HOSPITAL_COMMUNITY): Payer: Self-pay

## 2024-05-12 DIAGNOSIS — F902 Attention-deficit hyperactivity disorder, combined type: Secondary | ICD-10-CM

## 2024-05-12 DIAGNOSIS — Z0289 Encounter for other administrative examinations: Secondary | ICD-10-CM

## 2024-05-12 MED ORDER — AMPHETAMINE-DEXTROAMPHETAMINE 10 MG PO TABS
10.0000 mg | ORAL_TABLET | Freq: Two times a day (BID) | ORAL | 0 refills | Status: DC
  Filled 2024-05-12: qty 20, 10d supply, fill #0

## 2024-05-12 MED ORDER — AMPHETAMINE-DEXTROAMPHETAMINE 10 MG PO TABS
10.0000 mg | ORAL_TABLET | Freq: Two times a day (BID) | ORAL | 0 refills | Status: DC
  Filled 2024-06-15: qty 60, 30d supply, fill #0

## 2024-05-12 MED ORDER — AMPHETAMINE-DEXTROAMPHETAMINE 10 MG PO TABS
10.0000 mg | ORAL_TABLET | Freq: Two times a day (BID) | ORAL | 0 refills | Status: DC
  Filled 2024-05-12: qty 40, 20d supply, fill #0

## 2024-05-12 NOTE — Telephone Encounter (Signed)
 Copied from CRM #[id number]. Topic: General - Other >> May 12, 2024  8:34 AM Wess RAMAN wrote: Reason for CRM: Patient wanted to let Booker Pao know she is unable to schedule an appt at this time due to finances and she does not have the money for a copay.  Callback #: [id number]  Unfortunately, this is a requirement for controlled substances.

## 2024-05-12 NOTE — Telephone Encounter (Signed)
 Called patient, no answer, lvm for patient to call back and schedule an appointment for a medication refill.

## 2024-05-12 NOTE — Telephone Encounter (Signed)
 Copied from CRM [id number]. Topic: Clinical - Prescription Issue >> May 12, 2024  8:16 AM [place] wrote: Reason for CRM: Patient was trying to do a medication refill for Adderall- it was denied. There is a message that she needs to have an office visit, she states she was just there last month on 7/24 and was told she would only have to come every 3 months for the medication check. She said she signed a paper stating the 3 months >> May 12, 2024  9:09 AM Donna BRAVO wrote: Patient calling in asking about medication refill.  I read Darice Brownie FNP verbatim note:  Brownie Darice SAUNDERS, FNP    05/12/24  9:04 AM Note Copied from CRM [id number]. Topic: General - Other >> May 12, 2024  8:34 AM Wess RAMAN wrote: Reason for CRM: Patient wanted to let Brownie Darice know she is unable to schedule an appt at this time due to finances and she does not have the money for a copay.   Callback #: [id number]   Unfortunately, this is a requirement for controlled substances.    Patient is asking to speak with a nurse.

## 2024-05-16 ENCOUNTER — Other Ambulatory Visit: Payer: Self-pay | Admitting: Nurse Practitioner

## 2024-05-16 ENCOUNTER — Other Ambulatory Visit (HOSPITAL_COMMUNITY): Payer: Self-pay

## 2024-05-16 DIAGNOSIS — C7951 Secondary malignant neoplasm of bone: Secondary | ICD-10-CM

## 2024-05-16 DIAGNOSIS — G893 Neoplasm related pain (acute) (chronic): Secondary | ICD-10-CM

## 2024-05-16 DIAGNOSIS — Z515 Encounter for palliative care: Secondary | ICD-10-CM

## 2024-05-16 DIAGNOSIS — C50212 Malignant neoplasm of upper-inner quadrant of left female breast: Secondary | ICD-10-CM

## 2024-05-16 MED ORDER — MORPHINE SULFATE ER 30 MG PO TBCR
60.0000 mg | EXTENDED_RELEASE_TABLET | Freq: Three times a day (TID) | ORAL | 0 refills | Status: DC
  Filled 2024-05-16: qty 90, 15d supply, fill #0

## 2024-05-18 ENCOUNTER — Other Ambulatory Visit (HOSPITAL_COMMUNITY): Payer: Self-pay

## 2024-05-20 ENCOUNTER — Encounter: Payer: Self-pay | Admitting: Hematology and Oncology

## 2024-05-23 ENCOUNTER — Other Ambulatory Visit: Payer: Self-pay | Admitting: *Deleted

## 2024-05-23 ENCOUNTER — Encounter: Admitting: Family Medicine

## 2024-05-23 DIAGNOSIS — Z5181 Encounter for therapeutic drug level monitoring: Secondary | ICD-10-CM

## 2024-05-24 ENCOUNTER — Telehealth: Payer: Self-pay

## 2024-05-24 NOTE — Telephone Encounter (Signed)
 Copied from CRM #[id number]. Topic: Appointments - Scheduling Inquiry for Clinic >> May 24, 2024  7:37 AM Willma R wrote: Reason for CRM: Patient was scheduled for a [facility] appointment yesterday with Dr Vita and missed it due to her having food poisoning, was vomiting and had diarrhea and was unable to call and cancel. Patient would like to reschedule but the notes on the appoint state DO NOT RESCHEDULE.   Patient can be reached at [phone]

## 2024-05-30 ENCOUNTER — Other Ambulatory Visit (HOSPITAL_COMMUNITY): Payer: Self-pay

## 2024-05-30 ENCOUNTER — Encounter: Payer: Self-pay | Admitting: Hematology and Oncology

## 2024-05-30 ENCOUNTER — Other Ambulatory Visit: Payer: Self-pay | Admitting: Nurse Practitioner

## 2024-05-30 DIAGNOSIS — Z515 Encounter for palliative care: Secondary | ICD-10-CM

## 2024-05-30 DIAGNOSIS — G893 Neoplasm related pain (acute) (chronic): Secondary | ICD-10-CM

## 2024-05-30 DIAGNOSIS — C7951 Secondary malignant neoplasm of bone: Secondary | ICD-10-CM

## 2024-05-30 DIAGNOSIS — C50212 Malignant neoplasm of upper-inner quadrant of left female breast: Secondary | ICD-10-CM

## 2024-05-30 MED ORDER — MORPHINE SULFATE ER 60 MG PO TBCR
60.0000 mg | EXTENDED_RELEASE_TABLET | Freq: Three times a day (TID) | ORAL | 0 refills | Status: DC
  Filled 2024-05-30: qty 90, 30d supply, fill #0

## 2024-05-31 ENCOUNTER — Other Ambulatory Visit: Payer: Self-pay

## 2024-06-02 ENCOUNTER — Other Ambulatory Visit (HOSPITAL_COMMUNITY): Payer: Self-pay

## 2024-06-05 ENCOUNTER — Encounter: Payer: Self-pay | Admitting: Licensed Clinical Social Worker

## 2024-06-05 ENCOUNTER — Inpatient Hospital Stay: Attending: Hematology and Oncology

## 2024-06-05 ENCOUNTER — Other Ambulatory Visit: Payer: Self-pay | Admitting: *Deleted

## 2024-06-05 VITALS — BP 119/77 | HR 79 | Temp 98.9°F | Resp 19 | Wt 131.5 lb

## 2024-06-05 DIAGNOSIS — Z5112 Encounter for antineoplastic immunotherapy: Secondary | ICD-10-CM | POA: Diagnosis present

## 2024-06-05 DIAGNOSIS — C50212 Malignant neoplasm of upper-inner quadrant of left female breast: Secondary | ICD-10-CM

## 2024-06-05 DIAGNOSIS — C7951 Secondary malignant neoplasm of bone: Secondary | ICD-10-CM

## 2024-06-05 MED ORDER — TRASTUZUMAB-HYALURONIDASE-OYSK 600-10000 MG-UNT/5ML ~~LOC~~ SOLN
600.0000 mg | Freq: Once | SUBCUTANEOUS | Status: AC
  Administered 2024-06-05: 600 mg via SUBCUTANEOUS
  Filled 2024-06-05: qty 5

## 2024-06-05 MED ORDER — SODIUM CHLORIDE 0.9 % IV SOLN: Freq: Once | INTRAVENOUS | Status: DC

## 2024-06-05 NOTE — Progress Notes (Signed)
 [facility] Clinical Social Work  Clinical Social Work was referred by self for emotional support.  Clinical Social Worker met with patient to offer support and assess for needs.     Pt is interested in counseling. States she has a lot going on right now, including difficulty sleeping, causing more anxiety and depression.  CSW reviewed options for counseling and pt is open to seeing clinical social work Tax inspector, A. Delores.  CSW sent request to A. Delores to follow-up with pt on Wednesday to schedule.     Ramir Malerba E Dorean Daniello, LCSW  Clinical Social Worker [facility]

## 2024-06-05 NOTE — Patient Instructions (Signed)
 [facility] - A DEPT OF . [facility]  Discharge Instructions: Thank you for choosing [facility] to provide your oncology and hematology care.   If you have a lab appointment with the Cancer Center, please go directly to the Cancer Center and check in at the registration area.   Wear comfortable clothing and clothing appropriate for easy access to any Portacath or PICC line.   We strive to give you quality time with your provider. You may need to reschedule your appointment if you arrive late (15 or more minutes).  Arriving late affects you and other patients whose appointments are after yours.  Also, if you miss three or more appointments without notifying the office, you may be dismissed from the clinic at the provider's discretion.      For prescription refill requests, have your pharmacy contact our office and allow 72 hours for refills to be completed.    Today you received the following chemotherapy and/or immunotherapy agents herceptin  hylecta      To help prevent nausea and vomiting after your treatment, we encourage you to take your nausea medication as directed.  BELOW ARE SYMPTOMS THAT SHOULD BE REPORTED IMMEDIATELY: *FEVER GREATER THAN 100.4 F (38 C) OR HIGHER *CHILLS OR SWEATING *NAUSEA AND VOMITING THAT IS NOT CONTROLLED WITH YOUR NAUSEA MEDICATION *UNUSUAL SHORTNESS OF BREATH *UNUSUAL BRUISING OR BLEEDING *URINARY PROBLEMS (pain or burning when urinating, or frequent urination) *BOWEL PROBLEMS (unusual diarrhea, constipation, pain near the anus) TENDERNESS IN MOUTH AND THROAT WITH OR WITHOUT PRESENCE OF ULCERS (sore throat, sores in mouth, or a toothache) UNUSUAL RASH, SWELLING OR PAIN  UNUSUAL VAGINAL DISCHARGE OR ITCHING   Items with * indicate a potential emergency and should be followed up as soon as possible or go to the Emergency Department if any problems should occur.  Please show the CHEMOTHERAPY ALERT CARD or  IMMUNOTHERAPY ALERT CARD at check-in to the Emergency Department and triage nurse.  Should you have questions after your visit or need to cancel or reschedule your appointment, please contact [facility] - A DEPT OF [facility]  Dept: [phone]  and follow the prompts.  Office hours are 8:00 a.m. to 4:30 p.m. Monday - Friday. Please note that voicemails left after 4:00 p.m. may not be returned until the following business day.  We are closed weekends and major holidays. You have access to a nurse at all times for urgent questions. Please call the main number to the clinic Dept: [phone] and follow the prompts.   For any non-urgent questions, you may also contact your provider using MyChart. We now offer e-Visits for anyone 75 and older to request care online for non-urgent symptoms. For details visit [email or web address].   Also download the MyChart app! Go to the app store, search "MyChart", open the app, select [facility], and log in with your MyChart username and password.

## 2024-06-06 ENCOUNTER — Other Ambulatory Visit (HOSPITAL_COMMUNITY): Payer: Self-pay

## 2024-06-06 ENCOUNTER — Encounter: Payer: Self-pay | Admitting: Hematology and Oncology

## 2024-06-07 ENCOUNTER — Telehealth: Payer: Self-pay | Admitting: Hematology and Oncology

## 2024-06-07 ENCOUNTER — Telehealth: Payer: Self-pay

## 2024-06-07 NOTE — Telephone Encounter (Signed)
 Scheduled appointments per los. Called and left a VM with appointment details for the patient.

## 2024-06-07 NOTE — Telephone Encounter (Signed)
 [facility] CSW Progress Note  CSW Intern left VM for pt to follow up on counseling referral. Intern included contact information and will call again on Friday 9/19 if pt has not called back.  Dilpreet Faires B. Delores Clinical Social Work Tax inspector

## 2024-06-09 ENCOUNTER — Other Ambulatory Visit (HOSPITAL_COMMUNITY): Payer: Self-pay

## 2024-06-15 ENCOUNTER — Encounter: Payer: Self-pay | Admitting: Hematology and Oncology

## 2024-06-15 ENCOUNTER — Other Ambulatory Visit (HOSPITAL_COMMUNITY): Payer: Self-pay

## 2024-06-15 ENCOUNTER — Other Ambulatory Visit: Payer: Self-pay | Admitting: Family Medicine

## 2024-06-15 DIAGNOSIS — F902 Attention-deficit hyperactivity disorder, combined type: Secondary | ICD-10-CM

## 2024-06-15 MED ORDER — AMPHETAMINE-DEXTROAMPHETAMINE 10 MG PO TABS
10.0000 mg | ORAL_TABLET | Freq: Two times a day (BID) | ORAL | 0 refills | Status: DC
Start: 2024-06-15 — End: 2024-06-20
  Filled 2024-06-15: qty 60, 30d supply, fill #0

## 2024-06-16 ENCOUNTER — Inpatient Hospital Stay

## 2024-06-16 ENCOUNTER — Other Ambulatory Visit: Payer: Self-pay

## 2024-06-16 ENCOUNTER — Encounter: Payer: Self-pay | Admitting: Hematology and Oncology

## 2024-06-16 NOTE — Progress Notes (Signed)
 [facility] CSW Counseling Note  Patient was referred by self. Treatment type: Individual  Presenting Concerns: Patient and/or family reports the following symptoms/concerns: stress and overwhelm Duration of problem: started in 2022 when pain management became more difficult; more recently within the last few weeks pt reports she tried to cut back on pain meds and became physically ill. Severity of problem: mild   Orientation:oriented to person, place, and time/date.   Affect: Congruent Risk of harm to self or others: No plan to harm self or others  Patient and/or Family's Strengths/Protective Factors: Social connections, Social and Emotional competence, and Sense of purposeAbility for insight  Active sense of humor  Average or above average intelligence  Capable of independent living  Motivation for treatment/growth  Special hobby/interest  Supportive family/friends      Goals Addressed: Patient will:  Reduce symptoms of: overwhelm that makes completing daily tasks difficult. Increase knowledge and/or ability of: coping skills and healthy habits  Increase healthy adjustment to current life circumstances   Progress towards Goals: Initial   Interventions: Interventions utilized:  Intake Assessment  GAD 7 PHQ 9     06/16/2024    4:45 PM 06/05/2024    3:42 PM 05/08/2024    2:25 PM  PHQ9 SCORE ONLY  PHQ-9 Total Score 2 3 0      Data saved with a previous flowsheet row definition       06/16/2024    4:43 PM  GAD 7 : Generalized Anxiety Score  Nervous, Anxious, on Edge 1  Control/stop worrying 0  Worry too much - different things 1  Trouble relaxing 0  Restless 1  Easily annoyed or irritable 1  Afraid - awful might happen 0  Total GAD 7 Score 4  Anxiety Difficulty Extremely difficult       Assessment: Patient currently experiencing sense of overwhelm with daily tasks, although reports an improvement within the last week. She would like practical assistance with end of  life planning so that her surviving sister and son are not burdened. Pt also struggling with pain management and expressed desire for medicine, not drugs so that pain is controlled but she is not physically dependent. She reports trying to get an appointment with palliative care provider but has not yet been scheduled- CSW Intern sent message to palliative regarding this. Other stressors include financial concerns.   Intern validated pt's current use of coping skills such as spending time with friends/ neighbors, going camping/ being outdoors and near water , taking creative classes at [employer] [facility], and doing [place], which she describes as being calming and grounding. Pt also draws on traditions from her grandmother and great-grandmother such as herbal medicine that honor her Native ancestry and help manage symptoms related to sleep and pain. Intern offered empathetic listening and drew from ACT modality to increase acceptance of circumstances and address desired behavior changes.      Plan: Follow up with CSW: Next appointment scheduled on 10/3 at 3:30pm Behavioral recommendations:  Referral(s): N/A      Natalie Griffin Clinical Social Work Intern   Patient is participating in a Managed Medicaid Plan:  Yes.

## 2024-06-16 NOTE — Progress Notes (Signed)
 Reviewed chart for my follow up regarding [employer]. Patient was approved in 2024 and utilized all of her grant for housing expense.

## 2024-06-19 ENCOUNTER — Other Ambulatory Visit (HOSPITAL_COMMUNITY): Payer: Self-pay

## 2024-06-20 ENCOUNTER — Ambulatory Visit: Admitting: Family Medicine

## 2024-06-20 ENCOUNTER — Encounter: Payer: Self-pay | Admitting: Family Medicine

## 2024-06-20 ENCOUNTER — Other Ambulatory Visit (HOSPITAL_COMMUNITY): Payer: Self-pay

## 2024-06-20 ENCOUNTER — Encounter: Payer: Self-pay | Admitting: Hematology and Oncology

## 2024-06-20 VITALS — BP 118/80 | HR 69 | Wt 130.2 lb

## 2024-06-20 DIAGNOSIS — F902 Attention-deficit hyperactivity disorder, combined type: Secondary | ICD-10-CM

## 2024-06-20 MED ORDER — AMPHETAMINE-DEXTROAMPHETAMINE 10 MG PO TABS
10.0000 mg | ORAL_TABLET | Freq: Two times a day (BID) | ORAL | 0 refills | Status: DC
  Filled 2024-06-20 – 2024-06-27 (×2): qty 60, 30d supply, fill #0

## 2024-06-20 NOTE — Progress Notes (Signed)
   Name: Natalie Griffin   Date of Visit: 06/20/24   Date of last visit with me: Visit date not found   CHIEF COMPLAINT:  Chief Complaint  Patient presents with   Establish Care    Transfer of care. Wants ADHD meds refilled.       HPI:  Discussed the use of AI scribe software for clinical note transcription with the patient, who gave verbal consent to proceed.  History of Present Illness   Natalie Griffin is a 48 year old female who presents for re-establishing care and medication management.  She is addressing issues related to her medication management, specifically her prescription for Adderall, which she takes at a dose of 10 mg twice a day. She has experienced difficulties obtaining her prescription due to a previous provider requiring monthly visits for refills, which was inconsistent with her prior experiences of being seen every three months. She finds 20 mg of Adderall too much and recalls that Ritalin was ineffective, leaving her mentally blank.  She is currently receiving Herceptin  injections monthly at [facility]. She mentions the financial burden of these appointments. She is scheduled to see her oncologist, Dr. Erika, in October.  She is involved in counseling sessions with Thersia Daring and is accustomed to regular medical follow-ups every three months, as was the practice with her previous doctors.         OBJECTIVE:       06/20/2024    3:00 PM  Depression screen PHQ 2/9  Decreased Interest 0  Down, Depressed, Hopeless 0  PHQ - 2 Score 0     BP Readings from Last 3 Encounters:  06/20/24 118/80  06/05/24 119/77  05/08/24 (!) 128/96    BP 118/80   Pulse 69   Wt 130 lb 3.2 oz (59.1 kg)   SpO2 99%   BMI 24.60 kg/m    Physical Exam          Physical Exam Constitutional:      Appearance: Normal appearance.  Neurological:     Mental Status: She is alert.     ASSESSMENT/PLAN:   Assessment & Plan Attention deficit hyperactivity disorder  (ADHD), combined type    Assessment and Plan    Attention-deficit hyperactivity disorder (ADHD) ADHD well-managed with Adderall 10 mg twice daily. Previous Ritalin trials ineffective. - Prescribe Adderall 10 mg twice daily. - Send prescription to [facility]. - Schedule follow-up every 3-6 months.         Deral Schellenberg A. Vita MD [facility]

## 2024-06-21 ENCOUNTER — Encounter: Payer: Self-pay | Admitting: Hematology and Oncology

## 2024-06-21 ENCOUNTER — Other Ambulatory Visit (HOSPITAL_COMMUNITY): Payer: Self-pay

## 2024-06-21 NOTE — Progress Notes (Signed)
 I reviewed patient visit with social work Tax inspector. I concur with the treatment plan as documented in the SW intern's note.   Analis Distler E Siaosi Alter, LCSW Clinical Child psychotherapist

## 2024-06-22 ENCOUNTER — Telehealth: Payer: Self-pay

## 2024-06-22 NOTE — Telephone Encounter (Signed)
 LVM for pt regarding her Disability forms being completed and faxed to [facility]. No questions or concerns to be noted.

## 2024-06-23 ENCOUNTER — Inpatient Hospital Stay: Attending: Hematology and Oncology

## 2024-06-23 DIAGNOSIS — Z170 Estrogen receptor positive status [ER+]: Secondary | ICD-10-CM | POA: Insufficient documentation

## 2024-06-23 DIAGNOSIS — C7951 Secondary malignant neoplasm of bone: Secondary | ICD-10-CM | POA: Insufficient documentation

## 2024-06-23 DIAGNOSIS — C50212 Malignant neoplasm of upper-inner quadrant of left female breast: Secondary | ICD-10-CM | POA: Insufficient documentation

## 2024-06-23 DIAGNOSIS — Z5112 Encounter for antineoplastic immunotherapy: Secondary | ICD-10-CM | POA: Insufficient documentation

## 2024-06-27 ENCOUNTER — Other Ambulatory Visit (HOSPITAL_COMMUNITY): Payer: Self-pay

## 2024-06-27 ENCOUNTER — Encounter: Admitting: Family Medicine

## 2024-06-27 ENCOUNTER — Encounter: Payer: Self-pay | Admitting: Hematology and Oncology

## 2024-06-28 ENCOUNTER — Other Ambulatory Visit (HOSPITAL_COMMUNITY): Payer: Self-pay

## 2024-06-28 ENCOUNTER — Other Ambulatory Visit (HOSPITAL_BASED_OUTPATIENT_CLINIC_OR_DEPARTMENT_OTHER): Payer: Self-pay

## 2024-06-29 ENCOUNTER — Other Ambulatory Visit: Payer: Self-pay | Admitting: Nurse Practitioner

## 2024-06-29 ENCOUNTER — Other Ambulatory Visit (HOSPITAL_COMMUNITY): Payer: Self-pay

## 2024-06-29 DIAGNOSIS — G893 Neoplasm related pain (acute) (chronic): Secondary | ICD-10-CM

## 2024-06-29 DIAGNOSIS — C7951 Secondary malignant neoplasm of bone: Secondary | ICD-10-CM

## 2024-06-29 DIAGNOSIS — C50212 Malignant neoplasm of upper-inner quadrant of left female breast: Secondary | ICD-10-CM

## 2024-06-29 DIAGNOSIS — Z515 Encounter for palliative care: Secondary | ICD-10-CM

## 2024-06-30 ENCOUNTER — Other Ambulatory Visit (HOSPITAL_COMMUNITY): Payer: Self-pay

## 2024-06-30 MED ORDER — MORPHINE SULFATE ER 60 MG PO TBCR
60.0000 mg | EXTENDED_RELEASE_TABLET | Freq: Three times a day (TID) | ORAL | 0 refills | Status: DC
  Filled 2024-06-30: qty 90, 30d supply, fill #0

## 2024-07-03 ENCOUNTER — Inpatient Hospital Stay: Admitting: Hematology and Oncology

## 2024-07-03 ENCOUNTER — Inpatient Hospital Stay

## 2024-07-03 ENCOUNTER — Inpatient Hospital Stay: Admitting: Nurse Practitioner

## 2024-07-03 ENCOUNTER — Telehealth: Payer: Self-pay

## 2024-07-03 ENCOUNTER — Encounter: Payer: Self-pay | Admitting: Hematology and Oncology

## 2024-07-03 NOTE — Telephone Encounter (Signed)
 S/w patient regarding myChart message.  Patient aware that appointments would be cancelled and our scheduling team would be reaching out to get her rescheduled for a later date.  Reviewed interventions to manage diarrhea. Patient reports that this started because she has been off her pain medications since Saturday. She is picking them up today and will be having a friend bring her imodium to take for the diarrhea. Patient knows to contact the office should she feel like she needs to be seen sooner. ED precautions reviewed.

## 2024-07-03 NOTE — Assessment & Plan Note (Deleted)
 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  Patient refused adjuvant chemo, adjuvant radiation and adjuvant antiestrogen therapy.   Patient went to orthopedics for right hip pain.  Imaging revealed bone metastasis.  CT CAP: Cortical destruction of the right femoral neck consistent with skeletal metastases at risk for pathologic fracture.  Lucent lesion in the left iliac bone and L3 vertebral body consistent with multifocal skeletal metastases. 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%, HER2 3+ positive, Ki-67 35%    Treatment plan change: Oophorectomy: 09/30/2022 Current treatment: Anastrozole , Herceptin  Hylecta Patient refused switching from Herceptin  to Kadcyla (her insurance denied Herceptin  Hylecta and patient does not want to receive it in IV form), stopped Herceptin  (last given 09/17/2022) Neratinib  started 01/05/2023 discontinued 02/08/2023 resumed Herceptin  Hylecta 04/20/2023   Pain control: MS Contin  and MS IR (palliative care is managing her pain)   Hospitalization 03/24/2023-03/27/2023 increased pain in the back: Vertebral mets and compression fractures: Radiation to lumbar spine completed  04/12/2023 06/09/2023: Right lumpectomy: 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 CT angio 06/26/2023: No PE widespread bone metastases.  New subacute pathological fracture Rt sixth and ninth ribs CT CAP 11/05/23: Ext sclerotic and lucent bone lesions, Some more sclerosis, Right iliac bone lesion increasing (3.2 cm)   Bone metastases: On Xgeva  along with calcium  and vitamin D  CA 27-29: 795.6 on 04/10/2024 (was 1011 on 01/31/2024 and was 2217 on 04/20/2023) Given the tumor markers coming down we decided to hold off on obtaining another scan at this time.

## 2024-07-04 ENCOUNTER — Other Ambulatory Visit: Payer: Self-pay | Admitting: *Deleted

## 2024-07-04 DIAGNOSIS — C50212 Malignant neoplasm of upper-inner quadrant of left female breast: Secondary | ICD-10-CM

## 2024-07-04 NOTE — Progress Notes (Signed)
 Received mychart message from pt with complaint of new left breast nodule and overdue for mammogram.  RN reviewed with MD and verbal orders received to obtain diagnostic mammogram as well as left breast US .  Orders placed, pt notified and verbalized understanding.

## 2024-07-05 ENCOUNTER — Inpatient Hospital Stay

## 2024-07-05 NOTE — Progress Notes (Signed)
 [facility] CSW Counseling Note  Patient was referred by self. Treatment type: Individual  Presenting Concerns: Patient and/or family reports the following symptoms/concerns: stress and overwhelm Duration of problem: started in 2022 when pain management became more difficult; more recently within the last few weeks pt reports she tried to cut back on pain meds and became physically ill. Severity of problem: mild   Orientation:oriented to person, place, and time/date.   Affect: Congruent Risk of harm to self or others: No plan to harm self or others  Patient and/or Family's Strengths/Protective Factors: Social connections, Social and Emotional competence, and Sense of purposeAbility for insight  Active sense of humor  Average or above average intelligence  Capable of independent living  Motivation for treatment/growth  Special hobby/interest  Supportive family/friends      Goals Addressed: Patient will:  Reduce symptoms of: poor sleep; overwhelm that makes completing daily tasks difficult. Increase knowledge and/or ability of: coping skills and healthy habits  Increase healthy adjustment to current life circumstances   Progress towards Goals: Progressing    Interventions: Interventions utilized:  CBT-i; Problem solving/ Skills focused; Mindfulness     06/20/2024    3:00 PM 06/16/2024    4:45 PM 06/05/2024    3:42 PM  PHQ9 SCORE ONLY  PHQ-9 Total Score 0 2 3       06/16/2024    4:43 PM  GAD 7 : Generalized Anxiety Score  Nervous, Anxious, on Edge 1  Control/stop worrying 0  Worry too much - different things 1  Trouble relaxing 0  Restless 1  Easily annoyed or irritable 1  Afraid - awful might happen 0  Total GAD 7 Score 4  Anxiety Difficulty Extremely difficult       Assessment: Patient returns for counseling today, is experiencing poor sleep which causes overwhelm with daily tasks. Recent breakthrough pain episode disrupted sleep schedule and pt states she is a night  owl and it's easy to slide into sleeping during the day and being awake at night. However, this does not allow the daytime hours she needs for productivity. Pt and Intern discussed CBT-i for sleep and pt agreed to keep a sleep diary for the next two weeks to establish a baseline and ultimately improve sleep schedule. Pt reports pain episodes triggered by rainy weather which create anxiety re lack of productivity, however she prefers to avoid breakthrough pain meds. Pt and Intern identified that pt's desire to reduce pain meds overrides her need for productivity, and reframed pain episodes from lost time to craft days when pt can work on macrame. Intern also shared resources for guided meditations to reduce pain. Pt reports palliative care apt has been rescheduled for 07/31/24.  Pt and Intern discussed end of life planning and Intern has scheduled pt for Advance Directives clinic to update documents. Pt states she has moved from anger and sadness re her diagnosis to focusing on enjoying the time I have now. Her belief that she is a spiritual being having a human experience has helped pt to frame death as a personal journey rather than an ending.      Plan: Follow up with CSW: 08/02/24 Behavioral recommendations: Track sleep on handout and bring to next visit; plan ahead for craft days when weather is bad and breakthrough pain occurs; use guided meditations on Insight Timer app when breakthrough pain occurs; follow up with palliative care for medication management. Referral(s): Advance Directives clinic      [name] Clinical Social Work Intern  [facility]   Patient is participating in a Managed Medicaid Plan:  Yes.

## 2024-07-06 NOTE — Progress Notes (Signed)
 I reviewed patient visit with social work Tax inspector. I concur with the treatment plan as documented in the SW intern's note.   Analis Distler E Siaosi Alter, LCSW Clinical Child psychotherapist

## 2024-07-07 ENCOUNTER — Inpatient Hospital Stay

## 2024-07-07 ENCOUNTER — Encounter: Payer: Self-pay | Admitting: Hematology and Oncology

## 2024-07-07 VITALS — BP 115/76 | HR 66 | Temp 98.6°F | Resp 18 | Wt 131.0 lb

## 2024-07-07 DIAGNOSIS — Z170 Estrogen receptor positive status [ER+]: Secondary | ICD-10-CM | POA: Diagnosis not present

## 2024-07-07 DIAGNOSIS — C7951 Secondary malignant neoplasm of bone: Secondary | ICD-10-CM | POA: Diagnosis not present

## 2024-07-07 DIAGNOSIS — C50212 Malignant neoplasm of upper-inner quadrant of left female breast: Secondary | ICD-10-CM | POA: Diagnosis present

## 2024-07-07 DIAGNOSIS — Z5112 Encounter for antineoplastic immunotherapy: Secondary | ICD-10-CM | POA: Diagnosis present

## 2024-07-07 MED ORDER — TRASTUZUMAB-HYALURONIDASE-OYSK 600-10000 MG-UNT/5ML ~~LOC~~ SOLN
600.0000 mg | Freq: Once | SUBCUTANEOUS | Status: AC
  Administered 2024-07-07: 600 mg via SUBCUTANEOUS
  Filled 2024-07-07: qty 5

## 2024-07-07 NOTE — Patient Instructions (Signed)
 [facility] - A DEPT OF . [facility]  Discharge Instructions: Thank you for choosing [facility] to provide your oncology and hematology care.   If you have a lab appointment with the Cancer Center, please go directly to the Cancer Center and check in at the registration area.   Wear comfortable clothing and clothing appropriate for easy access to any Portacath or PICC line.   We strive to give you quality time with your provider. You may need to reschedule your appointment if you arrive late (15 or more minutes).  Arriving late affects you and other patients whose appointments are after yours.  Also, if you miss three or more appointments without notifying the office, you may be dismissed from the clinic at the provider's discretion.      For prescription refill requests, have your pharmacy contact our office and allow 72 hours for refills to be completed.    Today you received the following chemotherapy and/or immunotherapy agents herceptin  hylecta      To help prevent nausea and vomiting after your treatment, we encourage you to take your nausea medication as directed.  BELOW ARE SYMPTOMS THAT SHOULD BE REPORTED IMMEDIATELY: *FEVER GREATER THAN 100.4 F (38 C) OR HIGHER *CHILLS OR SWEATING *NAUSEA AND VOMITING THAT IS NOT CONTROLLED WITH YOUR NAUSEA MEDICATION *UNUSUAL SHORTNESS OF BREATH *UNUSUAL BRUISING OR BLEEDING *URINARY PROBLEMS (pain or burning when urinating, or frequent urination) *BOWEL PROBLEMS (unusual diarrhea, constipation, pain near the anus) TENDERNESS IN MOUTH AND THROAT WITH OR WITHOUT PRESENCE OF ULCERS (sore throat, sores in mouth, or a toothache) UNUSUAL RASH, SWELLING OR PAIN  UNUSUAL VAGINAL DISCHARGE OR ITCHING   Items with * indicate a potential emergency and should be followed up as soon as possible or go to the Emergency Department if any problems should occur.  Please show the CHEMOTHERAPY ALERT CARD or  IMMUNOTHERAPY ALERT CARD at check-in to the Emergency Department and triage nurse.  Should you have questions after your visit or need to cancel or reschedule your appointment, please contact [facility] - A DEPT OF [facility]  Dept: [phone]  and follow the prompts.  Office hours are 8:00 a.m. to 4:30 p.m. Monday - Friday. Please note that voicemails left after 4:00 p.m. may not be returned until the following business day.  We are closed weekends and major holidays. You have access to a nurse at all times for urgent questions. Please call the main number to the clinic Dept: [phone] and follow the prompts.   For any non-urgent questions, you may also contact your provider using MyChart. We now offer e-Visits for anyone 75 and older to request care online for non-urgent symptoms. For details visit [email or web address].   Also download the MyChart app! Go to the app store, search "MyChart", open the app, select [facility], and log in with your MyChart username and password.

## 2024-07-10 ENCOUNTER — Telehealth: Payer: Self-pay

## 2024-07-10 ENCOUNTER — Other Ambulatory Visit: Payer: Self-pay

## 2024-07-10 DIAGNOSIS — Z170 Estrogen receptor positive status [ER+]: Secondary | ICD-10-CM

## 2024-07-10 NOTE — Telephone Encounter (Signed)
 Received confirmation of successful fax transmission of signed imaging orders.  Patient aware.

## 2024-07-11 ENCOUNTER — Inpatient Hospital Stay: Admitting: Hematology and Oncology

## 2024-07-11 ENCOUNTER — Other Ambulatory Visit: Payer: Self-pay | Admitting: Hematology and Oncology

## 2024-07-11 DIAGNOSIS — Z170 Estrogen receptor positive status [ER+]: Secondary | ICD-10-CM

## 2024-07-11 DIAGNOSIS — Z5112 Encounter for antineoplastic immunotherapy: Secondary | ICD-10-CM | POA: Diagnosis not present

## 2024-07-11 DIAGNOSIS — C50212 Malignant neoplasm of upper-inner quadrant of left female breast: Secondary | ICD-10-CM | POA: Diagnosis not present

## 2024-07-11 DIAGNOSIS — C7951 Secondary malignant neoplasm of bone: Secondary | ICD-10-CM

## 2024-07-11 NOTE — Assessment & Plan Note (Addendum)
 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  Patient refused adjuvant chemo, adjuvant radiation and adjuvant antiestrogen therapy.   Patient went to orthopedics for right hip pain.  Imaging revealed bone metastasis.  CT CAP: Cortical destruction of the right femoral neck consistent with skeletal metastases at risk for pathologic fracture.  Lucent lesion in the left iliac bone and L3 vertebral body consistent with multifocal skeletal metastases. 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%, HER2 3+ positive, Ki-67 35%    Treatment plan change: Oophorectomy: 09/30/2022 Current treatment: Anastrozole , Herceptin  Hylecta Patient refused switching from Herceptin  to Kadcyla (her insurance denied Herceptin  Hylecta and patient does not want to receive it in IV form), stopped Herceptin  (last given 09/17/2022) Neratinib  started 01/05/2023 discontinued 02/08/2023 resumed Herceptin  Hylecta 04/20/2023   Pain control: MS Contin  and MS IR (palliative care is managing her pain)   Hospitalization 03/24/2023-03/27/2023 increased pain in the back: Vertebral mets and compression fractures: Radiation to lumbar spine completed  04/12/2023 06/09/2023: Right lumpectomy: 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 CT angio 06/26/2023: No PE widespread bone metastases.  New subacute pathological fracture Rt sixth and ninth ribs CT CAP 11/05/23: Ext sclerotic and lucent bone lesions, Some more sclerosis, Right iliac bone lesion increasing (3.2 cm)   Bone metastases: On Xgeva  along with calcium  and vitamin D  I discussed with her about obtaining scans at least once a year in February. In the meantime we will obtain tumor markers

## 2024-07-11 NOTE — Progress Notes (Signed)
 Patient Care Team: Pcp, No as PCP - General Tyree Nanetta SAILOR, RN as Oncology Nurse Navigator Loretha Ash, MD as Consulting Physician (Hematology and Oncology) Pickenpack-Cousar, Fannie SAILOR, NP as Nurse Practitioner [facility] [place] and Palliative Medicine)  DIAGNOSIS:  Encounter Diagnosis  Name Primary?   Malignant neoplasm of upper-inner quadrant of left breast in female, estrogen receptor positive (HCC)     SUMMARY OF ONCOLOGIC HISTORY: Oncology History  Malignant neoplasm of upper-inner quadrant of left breast in female, estrogen receptor positive (HCC)  04/11/2020 Initial Diagnosis   Patient palpated a left breast lump. Mammogram on 03/08/20 showed a 3.0cm mass at the 11 o'clock position with no axillary adenopathy. 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    02/2021 Relapse/Recurrence   Patient went to orthopedics for right hip pain.  Imaging revealed bone metastasis.  CT CAP: Cortical destruction of the right femoral neck consistent with skeletal metastases at risk for pathologic fracture.  Lucent lesion in the left iliac bone and L3 vertebral body consistent with multifocal skeletal metastases.   04/01/2021 Surgery   04/01/2021: Femoral intramedullary nail  Pathology review: Metastatic breast cancer ER 2% PR 0% HER2 3+ positive   04/08/2021 -  Anti-estrogen oral therapy   Tamoxifen  20 mg was prescribed on 04/08/2021 (discontinued by patient because she felt that the risks and benefits did not support it )--resumed in 07/2022 based on pathology results from  right breast biopsy   05/14/2021 -  Chemotherapy   Subcutaneous Herceptin  Perjeta  (Phesgo ) injection started 05/14/2021, switched to Herceptin  Hylecta 06/11/2021 (Fixed drug eruption, profound diarrhea to Phesgo )     05/20/2021 - 06/03/2021 Radiation Therapy   Palliative radiation to the iliac bone and hip   02/04/2022 Imaging   MRI thoracic spine 02/04/2022: Numerous bone metastases thoracic spine cervical and lumbar spines largest T5, T8, T11.  Chronic compression fractures T4, T5, T10 and T12    05/07/2022 Imaging   CT chest abdomen pelvis: No new or progressive bone metastases.  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   07/29/2022 PET scan   IMPRESSION: 1. Hypermetabolic mass in the posterior deep RIGHT breast consistent primary breast carcinoma. 2. Central nodal mediastinal metastasis to the high LEFT prevascular space and LEFT super clavicular node. 3. 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  [name] recommended changing treatment to Kadcyla and Nakeda wanted to wait until after the holidays.  She will discuss further with Dr. Gelene Recktenwald in January.   11/19/2022 - 11/19/2022 Chemotherapy   Patient is on Treatment Plan : BREAST Trastuzumab  IV (8/6) or SQ (600) D1 q21d     04/20/2023 -  Chemotherapy   Patient is on Treatment Plan : BREAST MAINTENANCE Trastuzumab  IV (6) or SQ (600) D1 q21d x 13 cycles     11/22/2023 - 12/03/2023 Radiation Therapy   Plan Name:  Pelvis_R Site: Hip, Right Technique: 3D Mode: Photon Dose Per Fraction: 3 Gy Prescribed Dose (Delivered / Prescribed): 30 Gy / 30 Gy Prescribed Fxs (Delivered / Prescribed): 10 / 10     CHIEF COMPLIANT: Follow-up on Herceptin  with anastrozole   HISTORY OF  PRESENT ILLNESS:  History of Present Illness Natalie Griffin is a 48 year old female who presents for follow-up regarding pain management and medication adjustment.  She experiences severe pain between her shoulder blades and spine, which worsens with low pressure weather conditions. There has been improvement since her last procedure. She is reducing her pain medication, having discontinued Percocet and currently taking two pills of 60 mg daily.  She missed a previous appointment due to withdrawal symptoms, including severe diarrhea, after running out of medication over a weekend when the pharmacy was closed.  A persistent lump in her back has remained unchanged in size over the past two months. She also experiences numbness and tingling in her left arm and hand, with sensations of 'pins and needles' and a feeling of her hand being asleep. These symptoms are not currently severe enough to consider surgical intervention. She notes a crunching sensation in her neck when moving her head.     ALLERGIES:  is allergic to adhesive [tape], fentanyl , phesgo  [pertuz-trastuz-hyaluron-zzxf], tetanus toxoid, and succinylcholine.  MEDICATIONS:  Current Outpatient Medications  Medication Sig Dispense Refill   amphetamine -dextroamphetamine  (ADDERALL) 10 MG tablet Take 1 tablet (10 mg total) by mouth 2 (two) times daily. 60 tablet 0   anastrozole  (ARIMIDEX ) 1 MG tablet Take 1 tablet (1 mg total) by mouth daily. 90 tablet 3   gabapentin  (NEURONTIN ) 300 MG capsule Take 1 capsule (300 mg total) by mouth 2 (two) times daily. 60 capsule 2   methocarbamol  (ROBAXIN ) 500 MG tablet Take 1 tablet (500 mg total) by mouth 3 (three) times daily. 90 tablet 1   morphine  (MS CONTIN ) 60 MG 12 hr tablet Take 1 tablet (60 mg total) by mouth every 8 (eight) hours. 90 tablet 0   Multiple Vitamin (ONE DAILY MULTIVITAMIN ADULT Griffin) Take 1 tablet by mouth daily.     ondansetron  (ZOFRAN -ODT) 4 MG disintegrating tablet Take 1 tablet  (4 mg total) by mouth every 8 (eight) hours as needed for nausea or vomiting. 20 tablet 3   No current facility-administered medications for this visit.    PHYSICAL EXAMINATION: ECOG PERFORMANCE STATUS: 1 - Symptomatic but completely ambulatory  Vitals:   07/11/24 1513  BP: 120/76  Pulse: 78  Resp: 18  Temp: 98.6 F (37 C)  SpO2: 98%   Filed Weights   07/11/24 1513  Weight: 131 lb 11.2 oz (59.7 kg)   LABORATORY DATA:  I have reviewed the data as listed    Latest Ref Rng & Units 04/10/2024    2:58 PM 01/31/2024    2:48 PM 11/12/2023    2:11 PM  CMP  Glucose 70 - 99 mg/dL 96  894  897   BUN 6 - 20 mg/dL 14  12  15    Creatinine 0.44 - 1.00 mg/dL 9.28  9.32  9.28   Sodium 135 - 145 mmol/L 136  140  139   Potassium 3.5 - 5.1 mmol/L 4.3  3.5  3.7   Chloride 98 - 111 mmol/L 105  108  101   CO2 22 - 32 mmol/L 25  25  28    Calcium  8.9 - 10.3 mg/dL 9.5  8.7  89.8   Total Protein 6.5 - 8.1 g/dL  7.1  7.4  7.4   Total Bilirubin 0.0 - 1.2 mg/dL 0.3  0.3  0.2   Alkaline Phos 38 - 126 U/L 69  112  282   AST 15 - 41 U/L 21  21  24    ALT 0 - 44 U/L 14  13  12      Lab Results  Component Value Date   WBC 6.1 04/10/2024   HGB 10.8 (L) 04/10/2024   HCT 33.6 (L) 04/10/2024   MCV 88.4 04/10/2024   PLT 229 04/10/2024   NEUTROABS 4.1 04/10/2024    ASSESSMENT & PLAN:  Malignant neoplasm of upper-inner quadrant of left breast in female, estrogen receptor positive (HCC) 06/04/2020:Left lumpectomy ([name]): invasive and in situ ductal carcinoma, 3.2cm, clear margins, one left axillary lymph node negative for carcinoma.  Patient refused adjuvant chemo, adjuvant radiation and adjuvant antiestrogen therapy.   Patient went to orthopedics for right hip pain.  Imaging revealed bone metastasis.  CT CAP: Cortical destruction of the right femoral neck consistent with skeletal metastases at risk for pathologic fracture.  Lucent lesion in the left iliac bone and L3 vertebral body consistent with  multifocal skeletal metastases. 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%, HER2 3+ positive, Ki-67 35%    Treatment plan change: Oophorectomy: 09/30/2022 Current treatment: Anastrozole , Herceptin  Hylecta Patient refused switching from Herceptin  to Kadcyla (her insurance denied Herceptin  Hylecta and patient does not want to receive it in IV form), stopped Herceptin  (last given 09/17/2022) Neratinib  started 01/05/2023 discontinued 02/08/2023 resumed Herceptin  Hylecta 04/20/2023   Pain control: MS Contin  and MS IR (palliative care is managing her pain)   Hospitalization 03/24/2023-03/27/2023 increased pain in the back: Vertebral mets and compression fractures: Radiation to lumbar spine completed 04/12/2023 06/09/2023: Right lumpectomy: 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 CT angio 06/26/2023: No PE widespread bone metastases.  New subacute pathological fracture Rt sixth and ninth ribs CT CAP 11/05/23: Ext sclerotic and lucent bone lesions, Some more sclerosis, Right iliac bone lesion increasing (3.2 cm)   Bone  metastases: On Xgeva  along with calcium  and vitamin D  I discussed with her about obtaining scans in 1 month and follow-up after that to discuss results Orders Placed This Encounter  Procedures   CT CHEST ABDOMEN PELVIS W CONTRAST    Standing Status:   Future    Expected Date:   07/27/2024    Expiration Date:   07/11/2025    If indicated for the ordered procedure, I authorize the administration of contrast media per Radiology protocol:   Yes    Does the patient have a contrast media/X-ray dye allergy?:   No    Preferred imaging location?:   [facility]  Release to patient:   Immediate    If indicated for the ordered procedure, I authorize the administration of oral contrast media per Radiology protocol:   No    Reason for no oral contrast::   breast   The patient has a good understanding of the overall plan. she agrees with it. she will call with any problems that may develop before the next visit here.  I personally spent a total of 30 minutes in the care of the patient today including preparing to see the patient, getting/reviewing separately obtained history, performing a medically appropriate exam/evaluation, counseling and educating, placing orders, referring and communicating with other health care professionals, documenting clinical information in the EHR, independently interpreting results, communicating results, and coordinating care.   Viinay K Norie Latendresse, MD 07/11/24

## 2024-07-12 ENCOUNTER — Telehealth: Payer: Self-pay | Admitting: Hematology and Oncology

## 2024-07-12 NOTE — Telephone Encounter (Signed)
 Spoke with pt about scheduling appt and she stated she would be out of town the expected day for her CT and would have to call to schedule

## 2024-07-14 ENCOUNTER — Telehealth: Payer: Self-pay

## 2024-07-14 ENCOUNTER — Inpatient Hospital Stay

## 2024-07-14 NOTE — Telephone Encounter (Signed)
 Patient called on 07/13/2024 with inquiries regarding [facility] Medicaid. Patient stated she currently [employer], can no longer premium, cost of treatment after insurance, and other bills, needs to know if she qualifies for [facility]. Patient was informed she was slightly over the income limit for [facility], but I will reach out to our DSS rep. Per DSS Rep, Natalie Griffin) have patient to call her to discuss other adult medicaid plans. Left detailed message on patient's voicemail.

## 2024-07-15 ENCOUNTER — Ambulatory Visit (HOSPITAL_BASED_OUTPATIENT_CLINIC_OR_DEPARTMENT_OTHER)

## 2024-07-17 ENCOUNTER — Ambulatory Visit (HOSPITAL_COMMUNITY): Admission: RE | Admit: 2024-07-17 | Source: Ambulatory Visit

## 2024-07-17 ENCOUNTER — Encounter (HOSPITAL_COMMUNITY): Payer: Self-pay

## 2024-07-27 ENCOUNTER — Ambulatory Visit (HOSPITAL_COMMUNITY)
Admission: RE | Admit: 2024-07-27 | Discharge: 2024-07-27 | Disposition: A | Discharge status: Home / Self Care | Source: Ambulatory Visit | Attending: Hematology and Oncology | Admitting: Hematology and Oncology

## 2024-07-27 ENCOUNTER — Other Ambulatory Visit (HOSPITAL_COMMUNITY): Payer: Self-pay

## 2024-07-27 ENCOUNTER — Other Ambulatory Visit: Payer: Self-pay | Admitting: Nurse Practitioner

## 2024-07-27 ENCOUNTER — Other Ambulatory Visit: Payer: Self-pay | Admitting: Family Medicine

## 2024-07-27 ENCOUNTER — Inpatient Hospital Stay: Attending: Hematology and Oncology

## 2024-07-27 ENCOUNTER — Encounter: Payer: Self-pay | Admitting: Hematology and Oncology

## 2024-07-27 ENCOUNTER — Other Ambulatory Visit: Payer: Self-pay

## 2024-07-27 DIAGNOSIS — F902 Attention-deficit hyperactivity disorder, combined type: Secondary | ICD-10-CM

## 2024-07-27 DIAGNOSIS — C50411 Malignant neoplasm of upper-outer quadrant of right female breast: Secondary | ICD-10-CM | POA: Insufficient documentation

## 2024-07-27 DIAGNOSIS — Z5112 Encounter for antineoplastic immunotherapy: Secondary | ICD-10-CM | POA: Diagnosis present

## 2024-07-27 DIAGNOSIS — C7951 Secondary malignant neoplasm of bone: Secondary | ICD-10-CM | POA: Diagnosis not present

## 2024-07-27 DIAGNOSIS — C50212 Malignant neoplasm of upper-inner quadrant of left female breast: Secondary | ICD-10-CM | POA: Diagnosis present

## 2024-07-27 DIAGNOSIS — Z170 Estrogen receptor positive status [ER+]: Secondary | ICD-10-CM | POA: Insufficient documentation

## 2024-07-27 DIAGNOSIS — G893 Neoplasm related pain (acute) (chronic): Secondary | ICD-10-CM | POA: Insufficient documentation

## 2024-07-27 DIAGNOSIS — Z515 Encounter for palliative care: Secondary | ICD-10-CM | POA: Diagnosis not present

## 2024-07-27 LAB — CBC WITH DIFFERENTIAL (CANCER CENTER ONLY)
Abs Immature Granulocytes: 0.02 K/uL (ref 0.00–0.07)
Basophils Absolute: 0 K/uL (ref 0.0–0.1)
Basophils Relative: 1 %
Eosinophils Absolute: 0.5 K/uL (ref 0.0–0.5)
Eosinophils Relative: 6 %
HCT: 35.6 % — ABNORMAL LOW (ref 36.0–46.0)
Hemoglobin: 11.8 g/dL — ABNORMAL LOW (ref 12.0–15.0)
Immature Granulocytes: 0 %
Lymphocytes Relative: 13 %
Lymphs Abs: 1.1 K/uL (ref 0.7–4.0)
MCH: 29.1 pg (ref 26.0–34.0)
MCHC: 33.1 g/dL (ref 30.0–36.0)
MCV: 87.7 fL (ref 80.0–100.0)
Monocytes Absolute: 0.7 K/uL (ref 0.1–1.0)
Monocytes Relative: 8 %
Neutro Abs: 6.4 K/uL (ref 1.7–7.7)
Neutrophils Relative %: 72 %
Platelet Count: 279 K/uL (ref 150–400)
RBC: 4.06 MIL/uL (ref 3.87–5.11)
RDW: 14.3 % (ref 11.5–15.5)
WBC Count: 8.8 K/uL (ref 4.0–10.5)
nRBC: 0 % (ref 0.0–0.2)

## 2024-07-27 LAB — CMP (CANCER CENTER ONLY)
ALT: 8 U/L (ref 0–44)
AST: 19 U/L (ref 15–41)
Albumin: 4.6 g/dL (ref 3.5–5.0)
Alkaline Phosphatase: 73 U/L (ref 38–126)
Anion gap: 8 (ref 5–15)
BUN: 12 mg/dL (ref 6–20)
CO2: 24 mmol/L (ref 22–32)
Calcium: 9.3 mg/dL (ref 8.9–10.3)
Chloride: 106 mmol/L (ref 98–111)
Creatinine: 0.82 mg/dL (ref 0.44–1.00)
GFR, Estimated: >60 mL/min (ref >60)
Glucose, Bld: 93 mg/dL (ref 70–99)
Potassium: 3.6 mmol/L (ref 3.5–5.1)
Sodium: 138 mmol/L (ref 135–145)
Total Bilirubin: 0.3 mg/dL (ref 0.0–1.2)
Total Protein: 7.6 g/dL (ref 6.5–8.1)

## 2024-07-27 MED ORDER — IOHEXOL 300 MG/ML  SOLN
100.0000 mL | Freq: Once | INTRAMUSCULAR | Status: AC | PRN
  Administered 2024-07-27: 100 mL via INTRAVENOUS

## 2024-07-27 MED ORDER — AMPHETAMINE-DEXTROAMPHETAMINE 10 MG PO TABS: 10.0000 mg | ORAL_TABLET | Freq: Two times a day (BID) | ORAL | 0 refills | Status: AC

## 2024-07-27 MED ORDER — AMPHETAMINE-DEXTROAMPHETAMINE 10 MG PO TABS
10.0000 mg | ORAL_TABLET | Freq: Two times a day (BID) | ORAL | 0 refills | Status: DC
  Filled 2024-07-27 (×2): qty 60, 30d supply, fill #0

## 2024-07-31 ENCOUNTER — Encounter: Payer: Self-pay | Admitting: Hematology and Oncology

## 2024-07-31 ENCOUNTER — Inpatient Hospital Stay: Admitting: General Practice

## 2024-07-31 ENCOUNTER — Telehealth: Payer: Self-pay | Admitting: Family Medicine

## 2024-07-31 ENCOUNTER — Other Ambulatory Visit: Payer: Self-pay

## 2024-07-31 ENCOUNTER — Ambulatory Visit

## 2024-07-31 ENCOUNTER — Other Ambulatory Visit (HOSPITAL_COMMUNITY): Payer: Self-pay

## 2024-07-31 ENCOUNTER — Ambulatory Visit: Admitting: Hematology and Oncology

## 2024-07-31 ENCOUNTER — Other Ambulatory Visit: Payer: Self-pay | Admitting: Nurse Practitioner

## 2024-07-31 ENCOUNTER — Inpatient Hospital Stay

## 2024-07-31 DIAGNOSIS — C50212 Malignant neoplasm of upper-inner quadrant of left female breast: Secondary | ICD-10-CM

## 2024-07-31 DIAGNOSIS — Z515 Encounter for palliative care: Secondary | ICD-10-CM

## 2024-07-31 DIAGNOSIS — G893 Neoplasm related pain (acute) (chronic): Secondary | ICD-10-CM

## 2024-07-31 DIAGNOSIS — C7951 Secondary malignant neoplasm of bone: Secondary | ICD-10-CM

## 2024-07-31 MED ORDER — MORPHINE SULFATE ER 60 MG PO TBCR
60.0000 mg | EXTENDED_RELEASE_TABLET | Freq: Three times a day (TID) | ORAL | 0 refills | Status: DC
  Filled 2024-07-31: qty 90, 30d supply, fill #0

## 2024-07-31 NOTE — Progress Notes (Signed)
 [facility] Healthcare Advance Directives Spiritual Care  Patient presented to Advance Directives Clinic  to review and complete healthcare advance directives.  Clinical Chaplain met with patient 1:1.  The patient designated Sammi Clotilda [place] of [place] [phone]) as their primary healthcare agent and declined to designate a secondary agent.  Patient also completed healthcare living will.    Documents were notarized and copies made for patient/family. Chaplain will send documents to medical records to be scanned into patient's chart. Chaplain encouraged patient/family to contact with any additional questions or concerns.   [address] [place], [place], [facility] Pager [phone] Voicemail [phone]

## 2024-07-31 NOTE — Telephone Encounter (Signed)
 Can you please call patient about her adderall. She says she has been told that a PA was being done for the past month and this month the price has gone up and she is getting very frustrated. I know you don't do PAs anymore but she is wanting some clarification since it is going on a month now.

## 2024-08-01 ENCOUNTER — Other Ambulatory Visit (INDEPENDENT_AMBULATORY_CARE_PROVIDER_SITE_OTHER)

## 2024-08-01 ENCOUNTER — Other Ambulatory Visit (HOSPITAL_COMMUNITY): Payer: Self-pay

## 2024-08-01 ENCOUNTER — Encounter: Payer: Self-pay | Admitting: Hematology and Oncology

## 2024-08-01 DIAGNOSIS — Z0189 Encounter for other specified special examinations: Secondary | ICD-10-CM | POA: Diagnosis not present

## 2024-08-01 DIAGNOSIS — Z79899 Other long term (current) drug therapy: Secondary | ICD-10-CM

## 2024-08-01 DIAGNOSIS — Z5181 Encounter for therapeutic drug level monitoring: Secondary | ICD-10-CM | POA: Diagnosis not present

## 2024-08-01 LAB — ECHOCARDIOGRAM COMPLETE
Area-P 1/2: 3.93 cm2
S' Lateral: 2.64 cm

## 2024-08-01 NOTE — Telephone Encounter (Signed)
 Called pharmacy & pt's insurance is rejecting Adderall due to pt being over 49 years old, (cash price is $20). Called pt & informed & she states she has been on Adderall since 2013 & works well for her.  Previous meds tried: States has tried Ritalin in the past & failed.  Also tried Adderall XR & failed.  She was diagnosed with ADHD many years ago.  Pt asked that we do P.A. to reduce her cost.  Please do P.A. for Adderall

## 2024-08-01 NOTE — Assessment & Plan Note (Signed)
 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  Patient refused adjuvant chemo, adjuvant radiation and adjuvant antiestrogen therapy.   Patient went to orthopedics for right hip pain.  Imaging revealed bone metastasis.  CT CAP: Cortical destruction of the right femoral neck consistent with skeletal metastases at risk for pathologic fracture.  Lucent lesion in the left iliac bone and L3 vertebral body consistent with multifocal skeletal metastases. 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%, HER2 3+ positive, Ki-67 35%    Treatment plan change: Oophorectomy: 09/30/2022 Current treatment: Anastrozole , Herceptin  Hylecta Patient refused switching from Herceptin  to Kadcyla (her insurance denied Herceptin  Hylecta and patient does not want to receive it in IV form), stopped Herceptin  (last given 09/17/2022) Neratinib  started 01/05/2023 discontinued 02/08/2023 resumed Herceptin  Hylecta 04/20/2023   Pain control: MS Contin  and MS IR (palliative care is managing her pain)   Hospitalization 03/24/2023-03/27/2023 increased pain in the back: Vertebral mets and compression fractures: Radiation to lumbar spine completed  04/12/2023 06/09/2023: Right lumpectomy: 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 CT angio 06/26/2023: No PE widespread bone metastases.  New subacute pathological fracture Rt sixth and ninth ribs CT CAP 11/05/23: Ext sclerotic and lucent bone lesions, Some more sclerosis, Right iliac bone lesion increasing (3.2 cm) CT CAP 07/27/2024:   Bone metastases: On Xgeva  along with calcium  and vitamin D  I discussed with her about obtaining scans in 1 month and follow-up after that to discuss results

## 2024-08-02 ENCOUNTER — Inpatient Hospital Stay

## 2024-08-02 ENCOUNTER — Encounter: Payer: Self-pay | Admitting: Nurse Practitioner

## 2024-08-02 ENCOUNTER — Other Ambulatory Visit: Payer: Self-pay | Admitting: Hematology and Oncology

## 2024-08-02 ENCOUNTER — Inpatient Hospital Stay (HOSPITAL_BASED_OUTPATIENT_CLINIC_OR_DEPARTMENT_OTHER): Admitting: Nurse Practitioner

## 2024-08-02 ENCOUNTER — Other Ambulatory Visit (HOSPITAL_COMMUNITY): Payer: Self-pay

## 2024-08-02 ENCOUNTER — Inpatient Hospital Stay (HOSPITAL_BASED_OUTPATIENT_CLINIC_OR_DEPARTMENT_OTHER): Admitting: Hematology and Oncology

## 2024-08-02 VITALS — BP 127/77 | HR 71 | Temp 98.7°F | Resp 20 | Wt 130.2 lb

## 2024-08-02 DIAGNOSIS — C50212 Malignant neoplasm of upper-inner quadrant of left female breast: Secondary | ICD-10-CM

## 2024-08-02 DIAGNOSIS — Z515 Encounter for palliative care: Secondary | ICD-10-CM

## 2024-08-02 DIAGNOSIS — R530 Neoplastic (malignant) related fatigue: Secondary | ICD-10-CM

## 2024-08-02 DIAGNOSIS — F419 Anxiety disorder, unspecified: Secondary | ICD-10-CM

## 2024-08-02 DIAGNOSIS — Z5112 Encounter for antineoplastic immunotherapy: Secondary | ICD-10-CM | POA: Diagnosis not present

## 2024-08-02 DIAGNOSIS — Z170 Estrogen receptor positive status [ER+]: Secondary | ICD-10-CM

## 2024-08-02 DIAGNOSIS — C7951 Secondary malignant neoplasm of bone: Secondary | ICD-10-CM

## 2024-08-02 DIAGNOSIS — R892 Abnormal level of other drugs, medicaments and biological substances in specimens from other organs, systems and tissues: Secondary | ICD-10-CM

## 2024-08-02 DIAGNOSIS — G893 Neoplasm related pain (acute) (chronic): Secondary | ICD-10-CM

## 2024-08-02 MED ORDER — TRASTUZUMAB-HYALURONIDASE-OYSK 600-10000 MG-UNT/5ML ~~LOC~~ SOLN
600.0000 mg | Freq: Once | SUBCUTANEOUS | Status: AC
  Administered 2024-08-02: 600 mg via SUBCUTANEOUS
  Filled 2024-08-02: qty 5

## 2024-08-02 MED ORDER — DIAZEPAM 2 MG PO TABS
2.0000 mg | ORAL_TABLET | Freq: Three times a day (TID) | ORAL | 0 refills | Status: DC | PRN
  Filled 2024-08-02: qty 45, 15d supply, fill #0

## 2024-08-02 MED ORDER — DENOSUMAB 120 MG/1.7ML ~~LOC~~ SOLN
120.0000 mg | Freq: Once | SUBCUTANEOUS | Status: AC
  Administered 2024-08-02: 120 mg via SUBCUTANEOUS
  Filled 2024-08-02: qty 1.7

## 2024-08-02 NOTE — Progress Notes (Signed)
 Based on recommendation by Dr. Floretta patient's cardiologist, we will obtain echocardiogram in 3 months.

## 2024-08-02 NOTE — Patient Instructions (Signed)
 [facility] - A DEPT OF [facility]  Discharge Instructions: Thank you for choosing [facility] to provide your oncology and hematology care.   If you have a lab appointment with the Cancer Center, please go directly to the Cancer Center and check in at the registration area.   Wear comfortable clothing and clothing appropriate for easy access to any Portacath or PICC line.   We strive to give you quality time with your provider. You may need to reschedule your appointment if you arrive late (15 or more minutes).  Arriving late affects you and other patients whose appointments are after yours.  Also, if you miss three or more appointments without notifying the office, you may be dismissed from the clinic at the provider's discretion.      For prescription refill requests, have your pharmacy contact our office and allow 72 hours for refills to be completed.    Today you received the following chemotherapy and/or immunotherapy agents :  Herceptin  Hylecta & Exgeva      To help prevent nausea and vomiting after your treatment, we encourage you to take your nausea medication as directed.  BELOW ARE SYMPTOMS THAT SHOULD BE REPORTED IMMEDIATELY: *FEVER GREATER THAN 100.4 F (38 C) OR HIGHER *CHILLS OR SWEATING *NAUSEA AND VOMITING THAT IS NOT CONTROLLED WITH YOUR NAUSEA MEDICATION *UNUSUAL SHORTNESS OF BREATH *UNUSUAL BRUISING OR BLEEDING *URINARY PROBLEMS (pain or burning when urinating, or frequent urination) *BOWEL PROBLEMS (unusual diarrhea, constipation, pain near the anus) TENDERNESS IN MOUTH AND THROAT WITH OR WITHOUT PRESENCE OF ULCERS (sore throat, sores in mouth, or a toothache) UNUSUAL RASH, SWELLING OR PAIN  UNUSUAL VAGINAL DISCHARGE OR ITCHING   Items with * indicate a potential emergency and should be followed up as soon as possible or go to the Emergency Department if any problems should occur.  Please show the CHEMOTHERAPY ALERT  CARD or IMMUNOTHERAPY ALERT CARD at check-in to the Emergency Department and triage nurse.  Should you have questions after your visit or need to cancel or reschedule your appointment, please contact [facility] - A DEPT OF [facility]  Dept: [phone]  and follow the prompts.  Office hours are 8:00 a.m. to 4:30 p.m. Monday - Friday. Please note that voicemails left after 4:00 p.m. may not be returned until the following business day.  We are closed weekends and major holidays. You have access to a nurse at all times for urgent questions. Please call the main number to the clinic Dept: [phone] and follow the prompts.   For any non-urgent questions, you may also contact your provider using MyChart. We now offer e-Visits for anyone 55 and older to request care online for non-urgent symptoms. For details visit [email or web address].   Also download the MyChart app! Go to the app store, search MyChart, open the app, select , and log in with your MyChart username and password.

## 2024-08-02 NOTE — Progress Notes (Signed)
 Order for Xgeva  x1  entered per Dr. [name], as pt due for tx today but doesn't have remaining orders in tx plan. Dr. [name] to add more orders to tx plan.   Mylie Mccurley, PharmD, MBA

## 2024-08-02 NOTE — Progress Notes (Signed)
 Patient Care Team: Jha, Panav, MD as PCP - General (Family Medicine) Tyree Nanetta SAILOR, RN as Oncology Nurse Navigator Loretha Ash, MD as Consulting Physician (Hematology and Oncology) Pickenpack-Cousar, Fannie SAILOR, NP as Nurse Practitioner [facility] and Palliative Medicine)  DIAGNOSIS:  Encounter Diagnosis  Name Primary?   Malignant neoplasm of upper-inner quadrant of left breast in female, estrogen receptor positive (HCC) Yes    SUMMARY OF ONCOLOGIC HISTORY: Oncology History  Malignant neoplasm of upper-inner quadrant of left breast in female, estrogen receptor positive (HCC)  04/11/2020 Initial Diagnosis   Patient palpated a left breast lump. Mammogram on 03/08/20 showed a 3.0cm mass at the 11 o'clock position with no axillary adenopathy. 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    02/2021 Relapse/Recurrence   Patient went to orthopedics for right hip pain.  Imaging revealed bone metastasis.  CT CAP: Cortical destruction of the right femoral neck consistent with skeletal metastases at risk for pathologic fracture.  Lucent lesion in the left iliac bone and L3 vertebral body consistent with multifocal skeletal metastases.   04/01/2021 Surgery   04/01/2021: Femoral intramedullary nail  Pathology review: Metastatic breast cancer ER 2% PR 0% HER2 3+ positive   04/08/2021 -  Anti-estrogen oral therapy   Tamoxifen  20 mg was prescribed on 04/08/2021 (discontinued by patient because she felt that the risks and benefits did not support it )--resumed in 07/2022 based on  pathology results from right breast biopsy   05/14/2021 -  Chemotherapy   Subcutaneous Herceptin  Perjeta  (Phesgo ) injection started 05/14/2021, switched to Herceptin  Hylecta 06/11/2021 (Fixed drug eruption, profound diarrhea to Phesgo )     05/20/2021 - 06/03/2021 Radiation Therapy   Palliative radiation to the iliac bone and hip   02/04/2022 Imaging   MRI thoracic spine 02/04/2022: Numerous bone metastases thoracic spine cervical and lumbar spines largest T5, T8, T11.  Chronic compression fractures T4, T5, T10 and T12    05/07/2022 Imaging   CT chest abdomen pelvis: No new or progressive bone metastases.  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   07/29/2022 PET scan   IMPRESSION: 1. Hypermetabolic mass in the posterior deep RIGHT breast consistent primary breast carcinoma. 2. Central nodal mediastinal metastasis to the high LEFT prevascular space and LEFT super clavicular node. 3. 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  [name] recommended changing treatment to Kadcyla and [place] wanted to wait until after the holidays.  She will discuss further with Dr. Lorena Benham in January.   11/19/2022 - 11/19/2022 Chemotherapy   Patient is on Treatment Plan : BREAST Trastuzumab  IV (8/6) or SQ (600) D1 q21d     04/20/2023 -  Chemotherapy   Patient is on Treatment Plan : BREAST MAINTENANCE Trastuzumab  IV (6) or SQ (600) D1 q21d x 13 cycles     11/22/2023 - 12/03/2023 Radiation Therapy  Plan Name: Pelvis_R Site: Hip, Right Technique: 3D Mode: Photon Dose Per Fraction: 3 Gy Prescribed Dose (Delivered / Prescribed): 30 Gy / 30 Gy Prescribed Fxs (Delivered / Prescribed): 10 / 10     CHIEF COMPLIANT: Follow-up on Herceptin   maintenance  HISTORY OF PRESENT ILLNESS: History of Present Illness Natalie Griffin is a 48 year old female who presents with concerns about back pain and a skin reaction.  She has a history of cancer and is concerned about a growth in her back with increased tenderness around T8 to T10. She fears it might affect her spinal cord and impact her mobility.  She has lost approximately fifty pounds over the last two years and feels tired, preferring to start her day later in the afternoon.     ALLERGIES:  is allergic to adhesive [tape], fentanyl , phesgo  [pertuz-trastuz-hyaluron-zzxf], tetanus toxoid, and succinylcholine.  MEDICATIONS:  Current Outpatient Medications  Medication Sig Dispense Refill   amphetamine -dextroamphetamine  (ADDERALL) 10 MG tablet Take 1 tablet (10 mg total) by mouth 2 (two) times daily. 60 tablet 0   [START ON 08/26/2024] amphetamine -dextroamphetamine  (ADDERALL) 10 MG tablet Take 1 tablet (10 mg total) by mouth 2 (two) times daily. 60 tablet 0   anastrozole  (ARIMIDEX ) 1 MG tablet Take 1 tablet (1 mg total) by mouth daily. 90 tablet 3   gabapentin  (NEURONTIN ) 300 MG capsule Take 1 capsule (300 mg total) by mouth 2 (two) times daily. 60 capsule 2   methocarbamol  (ROBAXIN ) 500 MG tablet Take 1 tablet (500 mg total) by mouth 3 (three) times daily. 90 tablet 1   morphine  (MS CONTIN ) 60 MG 12 hr tablet Take 1 tablet (60 mg total) by mouth every 8 (eight) hours. 90 tablet 0   Multiple Vitamin (ONE DAILY MULTIVITAMIN ADULT PO) Take 1 tablet by mouth daily.     ondansetron  (ZOFRAN -ODT) 4 MG disintegrating tablet Take 1 tablet (4 mg total) by mouth every 8 (eight) hours as needed for nausea or vomiting. 20 tablet 3   No current facility-administered medications for this visit.    PHYSICAL EXAMINATION: ECOG PERFORMANCE STATUS: 1 - Symptomatic but completely ambulatory  There were no vitals filed for this visit. There were no vitals filed for this visit.  Physical Exam    (exam performed in the presence of a chaperone)  LABORATORY DATA:  I have reviewed the data as listed    Latest Ref Rng & Units 07/27/2024    3:44 PM 04/10/2024    2:58 PM 01/31/2024    2:48 PM  CMP  Glucose 70 - 99 mg/dL 93  96  894   BUN 6 - 20 mg/dL 12  14  12    Creatinine 0.44 - 1.00 mg/dL 9.17  9.28  9.32   Sodium 135 - 145 mmol/L 138  136  140   Potassium 3.5 - 5.1 mmol/L 3.6  4.3  3.5   Chloride 98 - 111 mmol/L 106  105  108   CO2 22 - 32 mmol/L 24  25  25    Calcium  8.9 - 10.3 mg/dL 9.3  9.5  8.7   Total Protein 6.5 - 8.1 g/dL 7.6  7.1  7.4   Total Bilirubin 0.0 - 1.2 mg/dL 0.3  0.3  0.3   Alkaline Phos 38 - 126 U/L 73  69  112   AST 15 - 41 U/L 19  21  21    ALT 0 - 44 U/L 8  14  13      Lab Results  Component Value Date   WBC 8.8 07/27/2024   HGB 11.8 (L) 07/27/2024   HCT 35.6 (L) 07/27/2024   MCV 87.7 07/27/2024   PLT 279 07/27/2024   NEUTROABS 6.4 07/27/2024    ASSESSMENT & PLAN:  Malignant neoplasm of upper-inner quadrant of left breast in female, estrogen receptor positive (HCC) 06/04/2020:Left lumpectomy ([name]): invasive and in situ ductal carcinoma, 3.2cm, clear margins, one left axillary lymph node negative for carcinoma.  Patient refused adjuvant chemo, adjuvant radiation and adjuvant antiestrogen therapy.   Patient went to orthopedics for right hip pain.  Imaging revealed bone metastasis.  CT CAP: Cortical destruction of the right femoral neck consistent with skeletal metastases at risk for pathologic fracture.  Lucent lesion in the left iliac bone and L3 vertebral body consistent with multifocal skeletal metastases. 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%, HER2 3+ positive, Ki-67 35%    Treatment plan change: Oophorectomy: 09/30/2022 Current treatment: Anastrozole , Herceptin  Hylecta Patient refused switching from Herceptin  to Kadcyla (her insurance denied Herceptin  Hylecta and patient does not want to receive it in IV form), stopped Herceptin  (last given 09/17/2022) Neratinib  started 01/05/2023 discontinued 02/08/2023 resumed Herceptin  Hylecta 04/20/2023   Pain control: MS Contin  and MS IR (palliative care is managing her pain)   Hospitalization 03/24/2023-03/27/2023 increased pain in the back: Vertebral mets and compression fractures: Radiation to lumbar spine completed 04/12/2023 06/09/2023: Right lumpectomy: 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 CT angio 06/26/2023: No PE widespread bone metastases.  New subacute pathological fracture Rt sixth and ninth ribs CT CAP 11/05/23: Ext sclerotic and lucent bone lesions, Some more sclerosis, Right iliac bone lesion increasing (3.2 cm) CT CAP 07/27/2024: Change widespread mixed lytic and sclerotic bone metastases, high-grade pathologic wedge deformities of T8 and T10.   Bone metastases: On Xgeva  along with calcium  and vitamin D  She continues to follow with palliative care for adjusting her pain medication as well as antianxiety medications. Patient is extremely independent and her son lives in [place] . Wedge  compression fractures: I am concerned that these fractures could cause spinal cord compromise but she is able to manage it at this time.  I will see her back in February for follow-up.   No orders of the defined types were placed in this encounter.  The patient has a good understanding of the overall plan. she agrees with it. she will call with any problems that may develop before the next visit here.  I personally spent a total of 30 minutes in the care of the patient today including preparing to see the patient, getting/reviewing separately obtained history, performing a medically appropriate exam/evaluation, counseling and educating, placing orders, referring and communicating with other health care professionals, documenting clinical  information in the EHR, independently interpreting results, communicating results, and coordinating care.   Viinay K Roxana Lai, MD 08/02/24

## 2024-08-02 NOTE — Progress Notes (Signed)
 Palliative Medicine [facility]  [phone] [phone]   Name: Natalie Griffin Date: 08/02/2024 MRN: [id number]  DOB: [birth year]  Patient Care Team: Vita Morrow, MD as PCP - General (Family Medicine) Tyree Nanetta SAILOR, RN as Oncology Nurse Navigator Loretha Ash, MD as Consulting Physician (Hematology and Oncology) Pickenpack-Cousar, Fannie SAILOR, NP as Nurse Practitioner (Hospice and Palliative Medicine)    INTERVAL HISTORY: Natalie Griffin is a 48 y.o. 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 Palliative ask to see for symptom management and goals of care.   SOCIAL HISTORY:     reports that she quit smoking about 4 years ago. Her smoking use included cigarettes. She has never used smokeless tobacco. She reports that she does not currently use alcohol. She reports that she does not use drugs.  ADVANCE DIRECTIVES:  Advanced directives on file naming Natalie Griffin as patient healthcare power of attorney if patient becomes unable to speak for themselves.   CODE STATUS: DNR  PAST MEDICAL HISTORY: Past Medical History:  Diagnosis Date   Anemia    Anxiety    Bone metastasis    Breast cancer (HCC) 2021   Left breast invasive ductal carcinoma   Cancer (HCC) 2021   Left breast   Complication of anesthesia    pseudocholinesterase deficiency   Family history of adverse reaction to anesthesia    Father and Aunt have pseudocholinesterase deficiency - Trouble waking up from anesthesia   History of hiatal hernia    History of kidney stones    noted on CT Left nonobstructive   Pseudocholinesterase deficiency     ALLERGIES:  is allergic to adhesive [tape], fentanyl , phesgo  [pertuz-trastuz-hyaluron-zzxf], tetanus toxoid, and succinylcholine.  MEDICATIONS:  Current Outpatient Medications  Medication Sig Dispense Refill   diazepam  (VALIUM ) 2 MG  tablet Take 1 tablet (2 mg total) by mouth every 8 (eight) hours as needed for anxiety. 45 tablet 0   amphetamine -dextroamphetamine  (ADDERALL) 10 MG tablet Take 1 tablet (10 mg total) by mouth 2 (two) times daily. 60 tablet 0   [START ON 08/26/2024] amphetamine -dextroamphetamine  (ADDERALL) 10 MG tablet Take 1 tablet (10 mg total) by mouth 2 (two) times daily. 60 tablet 0   anastrozole  (ARIMIDEX ) 1 MG tablet Take 1 tablet (1 mg total) by mouth daily. 90 tablet 3   gabapentin  (NEURONTIN ) 300 MG capsule Take 1 capsule (300 mg total) by mouth 2 (two) times daily. 60 capsule 2   methocarbamol  (ROBAXIN ) 500 MG tablet Take 1 tablet (500 mg total) by mouth 3 (three) times daily. 90 tablet 1   morphine  (MS CONTIN ) 60 MG 12 hr tablet Take 1 tablet (60 mg total) by mouth every 8 (eight) hours. 90 tablet 0   Multiple Vitamin (ONE DAILY MULTIVITAMIN ADULT PO) Take 1 tablet by mouth daily.     ondansetron  (ZOFRAN -ODT) 4 MG disintegrating tablet Take 1 tablet (4 mg total) by mouth every 8 (eight) hours as needed for nausea or vomiting. 20 tablet 3   No current facility-administered medications for this visit.    VITAL SIGNS: BP 127/77   Pulse 71   Temp 98.7 F (37.1 C)   Resp 20   Wt 130 lb 3.2 oz (59.1 kg)   SpO2 99%   BMI 24.60 kg/m  Filed Weights   08/02/24 0946  Weight: 130 lb 3.2 oz (59.1 kg)     Estimated body mass index is 24.6 kg/m  as calculated from the following:   Height as of 07/11/24: 5' 1 (1.549 m).   Weight as of this encounter: 130 lb 3.2 oz (59.1 kg).   PERFORMANCE STATUS (ECOG) : 1 - Symptomatic but completely ambulatory   Physical Exam General: NAD Cardiovascular: regular rate and rhythm Pulmonary: normal breathing pattern Extremities: no edema, no joint deformities, nodule to spine area. Skin: no rashes Neurological: AAO x3  IMPRESSION: Discussed the use of AI scribe software for clinical note transcription with the patient, who gave verbal consent to  proceed.  History of Present Illness Natalie Griffin is a 48 year old female who was seen during infusion for symptom management follow-up. No acute distress noted.  Patient is doing well overall. No issues with constipation, diarrhea, nausea, or vomiting, noting that these symptoms are under control. Occasional fatigue however tries to remain as active as possible. Appetite is good. Weight is 130lbs.   Her history of cancer has contributed significantly to her anxiety. She avoids reviewing her medical reports to prevent exacerbating her anxiety, stating 'ignorance is bliss.' She has experienced anxiety related to receiving bad news about her health in the past.  She experiences nightmares and restlessness, which she associates with her use of Xanax  0.5 mg. She describes episodes of waking up in the kitchen without recollection, attributing this to anxiety rather than pain. Due to these side effects, she has discontinued Xanax . We discussed use of valium  as she would like to maintain on some support sharing her anxiety does impact her when it occurs. Education provided on use, efficacy, and potential side effects.   [name] reports her pain is well-controlled on current regimen. Some days are better than others. Recent increase in pain which she attributes to weather changes. Her current regimen includes  MS Contin  60 mg every 8 hours and gabapentin  300mg  twice daily as needed. No adjustments to this regimen at this time.   All questions answered and support provided.  Assessment & Plan Cancer related pain management  Pain well-managed with morphine  and Percocet.  No adjustments to current regimen at this time.   - Continue MS Contin  60 mg every 8 hours. - Patient is not requiring breakthrough pain medication. -Continue antiemetics for nausea and vomiting. - Will continue to closely monitor and adjust regimen as needed.  Anxiety disorder Exacerbated by cancer diagnosis and treatment, with  symptoms of nightmares, restlessness, and anxiety. Xanax  0.5 mg was previously used but discontinued due to adverse effects including nightmares and restlessness. Valium  is considered as an alternative due to its intermediate potency compared to Xanax  and Ativan. - Prescribed Valium  for anxiety management. - Provided a 15-day supply of Valium  to assess efficacy and tolerability. - Instructed to contact via MyChart if Valium  is effective or if a different medication is needed.  I will plan to see patient back in 4-6 weeks.  Sooner if needed.  Patient expressed understanding and was in agreement with this plan. She also understands that She can call the clinic at any time with any questions, concerns, or complaints.   Any controlled substances utilized were prescribed in the context of palliative care. PDMP has been reviewed.   Visit consisted of counseling and education dealing with the complex and emotionally intense issues of symptom management and palliative care in the setting of serious and potentially life-threatening illness.  Levon Borer, AGPCNP-BC  Palliative Medicine Team/[facility]

## 2024-08-03 ENCOUNTER — Telehealth: Payer: Self-pay

## 2024-08-03 ENCOUNTER — Other Ambulatory Visit (HOSPITAL_COMMUNITY): Payer: Self-pay

## 2024-08-03 NOTE — Telephone Encounter (Signed)
 P.A has been denied and never covered by her Insurance. She paid the same amount for her adderall in October as well. Which is 20.12. Nothing has changed.          Last month fill(see below)

## 2024-08-10 ENCOUNTER — Other Ambulatory Visit (HOSPITAL_COMMUNITY): Payer: Self-pay

## 2024-08-25 ENCOUNTER — Inpatient Hospital Stay: Attending: Hematology and Oncology

## 2024-08-29 ENCOUNTER — Encounter: Payer: Self-pay | Admitting: Hematology and Oncology

## 2024-08-29 ENCOUNTER — Other Ambulatory Visit (HOSPITAL_COMMUNITY): Payer: Self-pay

## 2024-08-29 ENCOUNTER — Other Ambulatory Visit: Payer: Self-pay | Admitting: Nurse Practitioner

## 2024-08-29 ENCOUNTER — Ambulatory Visit: Payer: Self-pay

## 2024-08-29 DIAGNOSIS — G893 Neoplasm related pain (acute) (chronic): Secondary | ICD-10-CM

## 2024-08-29 DIAGNOSIS — C7951 Secondary malignant neoplasm of bone: Secondary | ICD-10-CM

## 2024-08-29 DIAGNOSIS — Z515 Encounter for palliative care: Secondary | ICD-10-CM

## 2024-08-29 DIAGNOSIS — C50212 Malignant neoplasm of upper-inner quadrant of left female breast: Secondary | ICD-10-CM

## 2024-08-29 MED ORDER — MORPHINE SULFATE ER 60 MG PO TBCR
60.0000 mg | EXTENDED_RELEASE_TABLET | Freq: Three times a day (TID) | ORAL | 0 refills | Status: DC
  Filled 2024-08-29: qty 90, 30d supply, fill #0
  Filled 2024-08-29: qty 9, 3d supply, fill #0
  Filled 2024-08-31: qty 81, 27d supply, fill #1

## 2024-08-29 NOTE — Telephone Encounter (Signed)
 FYI Only or Action Required?: FYI only for provider: appointment scheduled on 12/11.  Patient was last seen in primary care on 06/20/2024 by Vita Morrow, MD.  Called Nurse Triage reporting Foot Injury.  Symptoms began several days ago.  Interventions attempted: Rest, hydration, or home remedies.  Symptoms are: gradually worsening.  Triage Disposition: See PCP When Office is Open (Within 3 Days)  Patient/caregiver understands and will follow disposition?: Yes, will follow disposition  Copied from CRM [id number]. Topic: Clinical - Red Word Triage >> Aug 29, 2024  2:58 PM Natalie Griffin wrote: Red Word that prompted transfer to Nurse Triage: Pt called in to schedule 52m fu to be able to fill controlled substance by PCP. Pt also believes she has a piece of glass on foot, not bleeding. Has had this for 4-5 days. Very tender, swollen, redness. Reason for Disposition  [1] After 3 days AND [2] pain not improved  Answer Assessment - Initial Assessment Questions 1. MECHANISM: How did the injury happen? (e.g., twisting injury, direct blow)      Pt states she feels like she has a piece of glass in foot 2. ONSET: When did the injury happen? (e.g., minutes or hours ago)      4-5 days ago 3. LOCATION: Where is the injury located?      R foot, bottom 4. APPEARANCE of INJURY: What does the injury look like?      Looks like bug bite, did break glass a few days ago, states she thought she had cleaned all the glass up 5. WEIGHT-BEARING: Can you put weight on that foot? Can you walk (four steps or more)?       States can walk on it,  6. SIZE: For cuts, bruises, or swelling, ask: How large is it? (e.g., inches or centimeters;  entire joint)      Red, swollen, tender 7. PAIN: Is there pain? If Yes, ask: How bad is the pain? What does it keep you from doing? (Scale 0-10; or none, mild, moderate, severe)     mild 8. TETANUS: For any breaks in the skin, ask: When was your last tetanus booster?      Allergic to tetanus shot, anaphylaxis.  9. OTHER SYMPTOMS: Do you have any other symptoms?      Denies  Pt states she needs controlled substance refilled, needs eval.  Protocols used: Foot Injury-A-AH

## 2024-08-30 ENCOUNTER — Other Ambulatory Visit (HOSPITAL_COMMUNITY): Payer: Self-pay

## 2024-08-30 ENCOUNTER — Encounter: Payer: Self-pay | Admitting: Hematology and Oncology

## 2024-08-30 ENCOUNTER — Inpatient Hospital Stay (HOSPITAL_BASED_OUTPATIENT_CLINIC_OR_DEPARTMENT_OTHER): Admitting: Nurse Practitioner

## 2024-08-30 ENCOUNTER — Inpatient Hospital Stay: Attending: Hematology and Oncology

## 2024-08-30 ENCOUNTER — Inpatient Hospital Stay

## 2024-08-30 VITALS — BP 135/96 | HR 100 | Temp 97.9°F | Resp 18 | Ht 61.0 in | Wt 133.2 lb

## 2024-08-30 DIAGNOSIS — F419 Anxiety disorder, unspecified: Secondary | ICD-10-CM | POA: Insufficient documentation

## 2024-08-30 DIAGNOSIS — Z5112 Encounter for antineoplastic immunotherapy: Secondary | ICD-10-CM | POA: Insufficient documentation

## 2024-08-30 DIAGNOSIS — C7951 Secondary malignant neoplasm of bone: Secondary | ICD-10-CM

## 2024-08-30 DIAGNOSIS — Z170 Estrogen receptor positive status [ER+]: Secondary | ICD-10-CM | POA: Diagnosis not present

## 2024-08-30 DIAGNOSIS — Z515 Encounter for palliative care: Secondary | ICD-10-CM | POA: Insufficient documentation

## 2024-08-30 DIAGNOSIS — C50212 Malignant neoplasm of upper-inner quadrant of left female breast: Secondary | ICD-10-CM

## 2024-08-30 DIAGNOSIS — G893 Neoplasm related pain (acute) (chronic): Secondary | ICD-10-CM

## 2024-08-30 DIAGNOSIS — G4709 Other insomnia: Secondary | ICD-10-CM

## 2024-08-30 DIAGNOSIS — C50411 Malignant neoplasm of upper-outer quadrant of right female breast: Secondary | ICD-10-CM | POA: Diagnosis present

## 2024-08-30 MED ORDER — TRASTUZUMAB-HYALURONIDASE-OYSK 600-10000 MG-UNT/5ML ~~LOC~~ SOLN
600.0000 mg | Freq: Once | SUBCUTANEOUS | Status: AC
  Administered 2024-08-30: 600 mg via SUBCUTANEOUS
  Filled 2024-08-30: qty 5

## 2024-08-30 MED ORDER — DIAZEPAM 5 MG PO TABS
5.0000 mg | ORAL_TABLET | Freq: Three times a day (TID) | ORAL | 2 refills | Status: DC | PRN
  Filled 2024-08-30 – 2024-08-31 (×2): qty 45, 15d supply, fill #0
  Filled 2024-10-01: qty 45, 15d supply, fill #1
  Filled 2024-10-20: qty 45, 15d supply, fill #2
  Filled ????-??-??: fill #2

## 2024-08-30 NOTE — Progress Notes (Signed)
 [facility] CSW Counseling Note  Patient was referred by self. Treatment type: Individual  Presenting Concerns: Patient and/or family reports the following symptoms/concerns: stress and overwhelm Duration of problem: started in 2022 when pain management became more difficult; more recently a few months ago pt reports she tried to cut back on pain meds and became physically ill. Severity of problem: mild   Orientation:oriented to person, place, and time/date.   Affect: Congruent Risk of harm to self or others: No plan to harm self or others  Patient and/or Family's Strengths/Protective Factors: Social connections, Social and Emotional competence, and Sense of purposeAbility for insight  Active sense of humor  Average or above average intelligence  Capable of independent living  Motivation for treatment/growth  Special hobby/interest  Supportive family/friends      Goals Addressed: Patient will:  Reduce symptoms of: poor sleep; overwhelm that makes completing daily tasks difficult. Increase knowledge and/or ability of: coping skills and healthy habits  Increase healthy adjustment to current life circumstances   Progress towards Goals: Met   Interventions: Interventions utilized:  CBT-i; ACT     07/07/2024    9:00 AM 06/20/2024    3:00 PM 06/16/2024    4:45 PM  PHQ9 SCORE ONLY  PHQ-9 Total Score 0 0 2       06/16/2024    4:43 PM  GAD 7 : Generalized Anxiety Score  Nervous, Anxious, on Edge 1  Control/stop worrying 0  Worry too much - different things 1  Trouble relaxing 0  Restless 1  Easily annoyed or irritable 1  Afraid - awful might happen 0  Total GAD 7 Score 4  Anxiety Difficulty Extremely difficult       Assessment: Patient returns for counseling today, reports that last week anxiety was high, however a close friend offered support and helped provide needed perspective. Patient identified in session that recent events had triggered an old trauma which led to  anxiety, and this awareness provided further relief.  Sleep journal provided at last session has assisted patient in re-setting sleep schedule, and helped her notice and change other patterns that were negatively impacting sleep. Patient states that with improved sleep other areas of life have developed a sense of ease, including increased energy for creative projects and [employer]. Pain management has also improved, and patient is satisfied with current medication regime for pain management. Intern helped to identify patient's core values of autonomy and freedom in relation to illness and treatment in addition to other aspects of life, and validated pt's ability to advocate for herself and live in alignment with these values.       Plan: Follow up with CSW Intern: On as needed basis. Patient will reach out to Intern if/ when she would like to schedule another counseling session.  Behavioral recommendations: Continue identified sleep hygiene habits and schedule, creative practice, mindfulness techniques. Referral(s): None at this time.      [name] Clinical Social Work Intern [employer]   Patient is participating in a Managed Medicaid Plan:  Yes.

## 2024-08-30 NOTE — Progress Notes (Signed)
 Palliative Medicine [facility]  Telephone:(336) [phone] Fax:(336) [phone]   Name: Natalie Griffin Date: 08/30/2024 MRN: [id number]  DOB: 02/26/1976  Patient Care Team: Vita Morrow, MD as PCP - General (Family Medicine) Tyree Nanetta SAILOR, RN as Oncology Nurse Navigator Loretha Ash, MD as Consulting Physician (Hematology and Oncology) Pickenpack-Cousar, Fannie SAILOR, NP as Nurse Practitioner (Hospice and Palliative Medicine)    INTERVAL HISTORY: Natalie Griffin is a 48 y.o. 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 Palliative ask to see for symptom management and goals of care.   SOCIAL HISTORY:     reports that she quit smoking about 4 years ago. Her smoking use included cigarettes. She has never used smokeless tobacco. She reports that she does not currently use alcohol. She reports that she does not use drugs.  ADVANCE DIRECTIVES:  Advanced directives on file naming [name] as patient healthcare power of attorney if patient becomes unable to speak for themselves.   CODE STATUS: DNR  PAST MEDICAL HISTORY: Past Medical History:  Diagnosis Date   Anemia    Anxiety    Bone metastasis    Breast cancer (HCC) 2021   Left breast invasive ductal carcinoma   Cancer (HCC) 2021   Left breast   Complication of anesthesia    pseudocholinesterase deficiency   Family history of adverse reaction to anesthesia    Father and Aunt have pseudocholinesterase deficiency - Trouble waking up from anesthesia   History of hiatal hernia    History of kidney stones    noted on CT Left nonobstructive   Pseudocholinesterase deficiency     ALLERGIES:  is allergic to adhesive [tape], fentanyl , phesgo  [pertuz-trastuz-hyaluron-zzxf], tetanus toxoid, and succinylcholine.  MEDICATIONS:  Current Outpatient Medications  Medication Sig Dispense Refill    amphetamine -dextroamphetamine  (ADDERALL) 10 MG tablet Take 1 tablet (10 mg total) by mouth 2 (two) times daily. 60 tablet 0   amphetamine -dextroamphetamine  (ADDERALL) 10 MG tablet Take 1 tablet (10 mg total) by mouth 2 (two) times daily. 60 tablet 0   anastrozole  (ARIMIDEX ) 1 MG tablet Take 1 tablet (1 mg total) by mouth daily. 90 tablet 3   diazepam  (VALIUM ) 5 MG tablet Take 1 tablet (5 mg total) by mouth every 8 (eight) hours as needed for anxiety. 45 tablet 2   gabapentin  (NEURONTIN ) 300 MG capsule Take 1 capsule (300 mg total) by mouth 2 (two) times daily. 60 capsule 2   methocarbamol  (ROBAXIN ) 500 MG tablet Take 1 tablet (500 mg total) by mouth 3 (three) times daily. 90 tablet 1   morphine  (MS CONTIN ) 60 MG 12 hr tablet Take 1 tablet (60 mg total) by mouth every 8 (eight) hours. 90 tablet 0   Multiple Vitamin (ONE DAILY MULTIVITAMIN ADULT PO) Take 1 tablet by mouth daily.     ondansetron  (ZOFRAN -ODT) 4 MG disintegrating tablet Take 1 tablet (4 mg total) by mouth every 8 (eight) hours as needed for nausea or vomiting. 20 tablet 3   No current facility-administered medications for this visit.    VITAL SIGNS: There were no vitals taken for this visit. There were no vitals filed for this visit.    Estimated body mass index is 25.18 kg/m as calculated from the following:   Height as of an earlier encounter on 08/30/24: 5' 1 (1.549 m).   Weight as of an earlier encounter on 08/30/24: 133 lb 4 oz (60.4 kg).   PERFORMANCE  STATUS (ECOG) : 1 - Symptomatic but completely ambulatory   Physical Exam General: NAD Cardiovascular: regular rate and rhythm Pulmonary: normal breathing pattern Extremities: no edema, no joint deformities, nodule to spine area. Skin: no rashes Neurological: AAO x3  IMPRESSION: Discussed the use of AI scribe software for clinical note transcription with the patient, who gave verbal consent to proceed.  History of Present Illness Natalie Griffin is a 48 year  old female who was seen during infusion for symptom management follow-up. Patient is doing well overall. No issues with constipation, diarrhea, nausea, or vomiting, noting that these symptoms are under control. Occasional fatigue however tries to remain as active as possible. Appetite is good. Weight is 133lbs.   Her history of cancer has contributed significantly to her anxiety. She avoids reviewing her medical reports to prevent exacerbating her anxiety, stating 'ignorance is bliss.' She has experienced anxiety related to receiving bad news about her health in the past.  She experiences significant anxiety, particularly related to financial and legal stressors post mother's passing. This led to a meltdown, exacerbated by reading an email at night, heightening her anxiety and making her feel as though she was 'freaking out'.  She is currently taking Valium  for anxiety but finds the initial dose ineffective. After taking an additional dose, the medication works, but she is concerned that she has experienced increasing in anxiety which is not currently controlled. The medication helps calm her down and does not cause nightmares, which she appreciates as previous medications attempted caused unwanted side effects. We will continue with Valium  5mg  as needed.   She sees a veterinary surgeon daily, which helps manage her anxiety. On days when she is unable to do much, she engages in arts and crafts, allowing her to feel productive without exacerbating her physical pain.  She describes herself as a 'goer doer' and finds it challenging to slow down and rest, but acknowledges the importance of doing so to manage her symptoms.  Brittnei reports her pain is well-controlled on current regimen. Some days are better than others. Recent increase in pain which she attributes to weather changes. Her current regimen includes  MS Contin  60 mg every 8 hours and gabapentin  300mg  twice daily as needed. No adjustments to this regimen  at this time.   All questions answered and support provided.  Assessment & Plan Cancer related pain management  Pain well-managed with morphine  and Percocet.  No adjustments to current regimen at this time.   - Continue MS Contin  60 mg every 8 hours. - Patient is not requiring breakthrough pain medication. -Continue antiemetics for nausea and vomiting. - Will continue to closely monitor and adjust regimen as needed.  Anxiety disorder Exacerbated by cancer diagnosis and treatment. Xanax  0.5 mg was previously used but discontinued due to adverse effects including nightmares and restlessness. Valium  is considered as an alternative due to its intermediate potency compared to Xanax  and Ativan. - Prescribed Valium  5mg  for anxiety management. -Patient is closely followed by LCSW for additional support   Palliative care management Engaged in counseling and arts and crafts to manage stress and maintain activity levels. - Continue palliative care management. - Encouraged engagement in counseling and arts and crafts.  I will plan to see patient back in 4-6 weeks.  Sooner if needed.  Patient expressed understanding and was in agreement with this plan. She also understands that She can call the clinic at any time with any questions, concerns, or complaints.   Any controlled substances utilized were  prescribed in the context of palliative care. PDMP has been reviewed.   Visit consisted of counseling and education dealing with the complex and emotionally intense issues of symptom management and palliative care in the setting of serious and potentially life-threatening illness.  Levon Borer, AGPCNP-BC  Palliative Medicine Team/[facility]

## 2024-08-30 NOTE — Telephone Encounter (Signed)
 Resubmitted a PA

## 2024-08-30 NOTE — Patient Instructions (Signed)
 [facility] - A DEPT OF [facility]  Discharge Instructions: Thank you for choosing [facility] to provide your oncology and hematology care.   If you have a lab appointment with the Cancer Center, please go directly to the Cancer Center and check in at the registration area.   Wear comfortable clothing and clothing appropriate for easy access to any Portacath or PICC line.   We strive to give you quality time with your provider. You may need to reschedule your appointment if you arrive late (15 or more minutes).  Arriving late affects you and other patients whose appointments are after yours.  Also, if you miss three or more appointments without notifying the office, you may be dismissed from the clinic at the providers discretion.      For prescription refill requests, have your pharmacy contact our office and allow 72 hours for refills to be completed.    Today you received the following chemotherapy and/or immunotherapy agents: trastuzumab -hyaluronidase -oysk (HERCEPTIN  HYLECTA)     To help prevent nausea and vomiting after your treatment, we encourage you to take your nausea medication as directed.  BELOW ARE SYMPTOMS THAT SHOULD BE REPORTED IMMEDIATELY: *FEVER GREATER THAN 100.4 F (38 C) OR HIGHER *CHILLS OR SWEATING *NAUSEA AND VOMITING THAT IS NOT CONTROLLED WITH YOUR NAUSEA MEDICATION *UNUSUAL SHORTNESS OF BREATH *UNUSUAL BRUISING OR BLEEDING *URINARY PROBLEMS (pain or burning when urinating, or frequent urination) *BOWEL PROBLEMS (unusual diarrhea, constipation, pain near the anus) TENDERNESS IN MOUTH AND THROAT WITH OR WITHOUT PRESENCE OF ULCERS (sore throat, sores in mouth, or a toothache) UNUSUAL RASH, SWELLING OR PAIN  UNUSUAL VAGINAL DISCHARGE OR ITCHING   Items with * indicate a potential emergency and should be followed up as soon as possible or go to the Emergency Department if any problems should occur.  Please show the  CHEMOTHERAPY ALERT CARD or IMMUNOTHERAPY ALERT CARD at check-in to the Emergency Department and triage nurse.  Should you have questions after your visit or need to cancel or reschedule your appointment, please contact [facility] - A DEPT OF [facility]  Dept: [phone]  and follow the prompts.  Office hours are 8:00 a.m. to 4:30 p.m. Monday - Friday. Please note that voicemails left after 4:00 p.m. may not be returned until the following business day.  We are closed weekends and major holidays. You have access to a nurse at all times for urgent questions. Please call the main number to the clinic Dept: [phone] and follow the prompts.   For any non-urgent questions, you may also contact your provider using MyChart. We now offer e-Visits for anyone 40 and older to request care online for non-urgent symptoms. For details visit [email or web address].   Also download the MyChart app! Go to the app store, search MyChart, open the app, select [facility], and log in with your MyChart username and password.

## 2024-08-31 ENCOUNTER — Other Ambulatory Visit (INDEPENDENT_AMBULATORY_CARE_PROVIDER_SITE_OTHER): Payer: Self-pay

## 2024-08-31 ENCOUNTER — Ambulatory Visit: Admitting: Family Medicine

## 2024-08-31 ENCOUNTER — Other Ambulatory Visit (HOSPITAL_COMMUNITY): Payer: Self-pay

## 2024-08-31 ENCOUNTER — Encounter: Payer: Self-pay | Admitting: Family Medicine

## 2024-08-31 VITALS — BP 130/72 | HR 98 | Ht 65.0 in | Wt 129.2 lb

## 2024-08-31 DIAGNOSIS — M79671 Pain in right foot: Secondary | ICD-10-CM

## 2024-08-31 NOTE — Progress Notes (Signed)
 I reviewed patient visit by social work Tax inspector. I concur with the treatment plan as documented in the SW intern's note.   Nazareth Norenberg E Lylianna Fraiser, LCSW Clinical Child psychotherapist

## 2024-08-31 NOTE — Progress Notes (Signed)
 =  Name: Natalie Griffin   Date of Visit: 08/31/2024   Date of last visit with me: 07/31/2024   CHIEF COMPLAINT:  Chief Complaint  Patient presents with   other    Right Foot pain possible glass in foot, swollen size of quarter/med refill adderall        HPI:  Discussed the use of AI scribe software for clinical note transcription with the patient, who gave verbal consent to proceed.  History of Present Illness   Natalie Griffin is a 48 year old female who presents with a painful swelling in her foot following a glass shard injury.  Approximately one and a half weeks ago, she stepped on a glass shard in her kitchen. Initially, she thought it was a bite, but the area has since developed into a painful, swollen bump on her foot. The initial size was similar to an eraser, which has progressively increased in size and appears more infected. She denies any fever or chills. She is concerned about a possible glass shard still being present in her foot, as she felt a poke when the incident occurred.  Her past medical history includes having a rod in her leg, which limits her flexibility and ability to inspect the foot herself. She has a history of avoiding antibiotics, having last taken them at age 38 for severe strep throat. She is currently on Adderall, which has been denied due to age restrictions, and occasionally takes Valium  for anxiety related to her cancer diagnosis. She mentions experiencing back pain, likening it to a 'hunchback' due to medication effects.  She expresses significant anxiety, recalling past experiences with needles and childbirth.         OBJECTIVE:       07/07/2024    9:00 AM  Depression screen PHQ 2/9  Decreased Interest 0  Down, Depressed, Hopeless 0  PHQ - 2 Score 0     BP Readings from Last 3 Encounters:  08/31/24 130/72  08/30/24 (!) 135/96  08/02/24 127/77    BP 130/72   Pulse 98   Ht 5' 5 (1.651 m)   Wt 129 lb 3.2 oz (58.6 kg)   SpO2  98%   BMI 21.50 kg/m    Physical Exam   EXTREMITIES: Firm mass on foot, TTP, no erythema or signs of spreading infection. Minimal fluctuance. Suspected fibroma, with cystic formation. No drainage.      Physical Exam  ASSESSMENT/PLAN:   Assessment & Plan Right foot pain    Assessment and Plan    Right foot mass (abscess vs. fibroma) Mass on right foot with cystic formation and fluid collection, differential includes abscess or fibroma. No systemic infection signs. Ultrasound supports cystic nature. - Attempted I&D mass for content assessment, but no fluid obtained; mass found to be firm and possibly fibrous. Did decrease in size minimally. U/S does confirm fluid filled cyst.  - Referred to foot and ankle surgeon for further evaluation. - Advised on self-care with cleaning and monitoring.  Attention-deficit hyperactivity disorder, inattentive type On Adderall for ADHD. Insurance coverage issues due to age restrictions and concurrent diazepam  use.  Breast cancer, active treatment Undergoing active treatment with recent Herceptin  injection. - Continue current cancer treatment regimen.       Diagnosis: abscess - Location: Plantar side foot Procedure: Incision & drainage Informed consent:  Discussed risks (permanent loss of nail, permanent irregular growth of nail, infection, pain, bleeding, bruising, numbness, and recurrence of the condition) and benefits of the procedure,  as well as the alternatives.  Informed consent was obtained. Anesthesia: 5 cc lido Type: total The area was prepared and draped in a standard fashion. The lesion drained blood. Antibiotic ointment and a sterile pressure dressing were applied. The patient tolerated the procedure well. The patient was instructed on post-op care.   Hermelinda Diegel A. Vita MD [facility]

## 2024-09-07 ENCOUNTER — Ambulatory Visit: Admitting: Orthopedic Surgery

## 2024-09-25 ENCOUNTER — Ambulatory Visit (INDEPENDENT_AMBULATORY_CARE_PROVIDER_SITE_OTHER): Admitting: Orthopedic Surgery

## 2024-09-25 ENCOUNTER — Other Ambulatory Visit (INDEPENDENT_AMBULATORY_CARE_PROVIDER_SITE_OTHER): Payer: Self-pay

## 2024-09-25 DIAGNOSIS — M722 Plantar fascial fibromatosis: Secondary | ICD-10-CM | POA: Diagnosis not present

## 2024-09-25 DIAGNOSIS — M79671 Pain in right foot: Secondary | ICD-10-CM

## 2024-09-26 ENCOUNTER — Other Ambulatory Visit (HOSPITAL_COMMUNITY): Payer: Self-pay

## 2024-09-26 ENCOUNTER — Encounter: Payer: Self-pay | Admitting: Licensed Clinical Social Worker

## 2024-09-26 ENCOUNTER — Encounter: Payer: Self-pay | Admitting: Nurse Practitioner

## 2024-09-26 ENCOUNTER — Inpatient Hospital Stay (HOSPITAL_BASED_OUTPATIENT_CLINIC_OR_DEPARTMENT_OTHER): Payer: Self-pay | Admitting: Nurse Practitioner

## 2024-09-26 ENCOUNTER — Encounter: Payer: Self-pay | Admitting: Orthopedic Surgery

## 2024-09-26 ENCOUNTER — Inpatient Hospital Stay: Payer: Self-pay | Attending: Hematology and Oncology

## 2024-09-26 VITALS — BP 98/78 | HR 97 | Temp 97.9°F | Resp 16 | Ht 65.0 in | Wt 127.0 lb

## 2024-09-26 DIAGNOSIS — R530 Neoplastic (malignant) related fatigue: Secondary | ICD-10-CM

## 2024-09-26 DIAGNOSIS — Z515 Encounter for palliative care: Secondary | ICD-10-CM

## 2024-09-26 DIAGNOSIS — C50212 Malignant neoplasm of upper-inner quadrant of left female breast: Secondary | ICD-10-CM | POA: Insufficient documentation

## 2024-09-26 DIAGNOSIS — F419 Anxiety disorder, unspecified: Secondary | ICD-10-CM

## 2024-09-26 DIAGNOSIS — C50919 Malignant neoplasm of unspecified site of unspecified female breast: Secondary | ICD-10-CM

## 2024-09-26 DIAGNOSIS — C7951 Secondary malignant neoplasm of bone: Secondary | ICD-10-CM

## 2024-09-26 DIAGNOSIS — Z170 Estrogen receptor positive status [ER+]: Secondary | ICD-10-CM

## 2024-09-26 DIAGNOSIS — Z5112 Encounter for antineoplastic immunotherapy: Secondary | ICD-10-CM | POA: Insufficient documentation

## 2024-09-26 DIAGNOSIS — G4709 Other insomnia: Secondary | ICD-10-CM

## 2024-09-26 DIAGNOSIS — Z87891 Personal history of nicotine dependence: Secondary | ICD-10-CM

## 2024-09-26 DIAGNOSIS — G893 Neoplasm related pain (acute) (chronic): Secondary | ICD-10-CM

## 2024-09-26 DIAGNOSIS — C50411 Malignant neoplasm of upper-outer quadrant of right female breast: Secondary | ICD-10-CM | POA: Insufficient documentation

## 2024-09-26 MED ORDER — TRASTUZUMAB-HYALURONIDASE-OYSK 600-10000 MG-UNT/5ML ~~LOC~~ SOLN
600.0000 mg | Freq: Once | SUBCUTANEOUS | Status: AC
  Administered 2024-09-26: 600 mg via SUBCUTANEOUS
  Filled 2024-09-26: qty 5

## 2024-09-26 MED ORDER — MORPHINE SULFATE ER 60 MG PO TBCR
60.0000 mg | EXTENDED_RELEASE_TABLET | Freq: Three times a day (TID) | ORAL | 0 refills | Status: AC
  Filled 2024-09-28: qty 90, 30d supply, fill #0

## 2024-09-26 MED ORDER — SODIUM CHLORIDE 0.9 % IV SOLN: Freq: Once | INTRAVENOUS | Status: DC

## 2024-09-26 NOTE — Progress Notes (Signed)
 "    Palliative Medicine [facility]  Telephone:(336) [phone] Fax:(336) [phone]   Name: Natalie Griffin Date: 09/26/2024 MRN: [id number]  DOB: October 10, 1975  Patient Care Team: Vita Morrow, MD as PCP - General (Family Medicine) Tyree Nanetta SAILOR, RN as Oncology Nurse Navigator Loretha Ash, MD as Consulting Physician (Hematology and Oncology) Pickenpack-Cousar, Fannie SAILOR, NP as Nurse Practitioner (Hospice and Palliative Medicine)    INTERVAL HISTORY: Natalie Griffin is a 49 y.o. 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 Palliative ask to see for symptom management and goals of care.   SOCIAL HISTORY:     reports that she quit smoking about 4 years ago. Her smoking use included cigarettes. She has never used smokeless tobacco. She reports that she does not currently use alcohol. She reports that she does not use drugs.  ADVANCE DIRECTIVES:  Advanced directives on file naming Natalie Griffin as patient healthcare power of attorney if patient becomes unable to speak for themselves.   CODE STATUS: DNR  PAST MEDICAL HISTORY: Past Medical History:  Diagnosis Date   Anemia    Anxiety    Bone metastasis    Breast cancer (HCC) 2021   Left breast invasive ductal carcinoma   Cancer (HCC) 2021   Left breast   Complication of anesthesia    pseudocholinesterase deficiency   Family history of adverse reaction to anesthesia    Father and Aunt have pseudocholinesterase deficiency - Trouble waking up from anesthesia   History of hiatal hernia    History of kidney stones    noted on CT Left nonobstructive   Pseudocholinesterase deficiency     ALLERGIES:  is allergic to adhesive [tape], fentanyl , phesgo  [pertuz-trastuz-hyaluron-zzxf], tetanus toxoid, and succinylcholine.  MEDICATIONS:  Current Outpatient Medications  Medication Sig Dispense Refill   amphetamine -dextroamphetamine   (ADDERALL) 10 MG tablet Take 1 tablet (10 mg total) by mouth 2 (two) times daily. 60 tablet 0   amphetamine -dextroamphetamine  (ADDERALL) 10 MG tablet Take 1 tablet (10 mg total) by mouth 2 (two) times daily. 60 tablet 0   anastrozole  (ARIMIDEX ) 1 MG tablet Take 1 tablet (1 mg total) by mouth daily. 90 tablet 3   diazepam  (VALIUM ) 5 MG tablet Take 1 tablet (5 mg total) by mouth every 8 (eight) hours as needed for anxiety. 45 tablet 2   [START ON 09/28/2024] morphine  (MS CONTIN ) 60 MG 12 hr tablet Take 1 tablet (60 mg total) by mouth every 8 (eight) hours. 90 tablet 0   Multiple Vitamin (ONE DAILY MULTIVITAMIN ADULT PO) Take 1 tablet by mouth daily.     ondansetron  (ZOFRAN -ODT) 4 MG disintegrating tablet Take 1 tablet (4 mg total) by mouth every 8 (eight) hours as needed for nausea or vomiting. 20 tablet 3   No current facility-administered medications for this visit.   Facility-Administered Medications Ordered in Other Visits  Medication Dose Route Frequency Provider Last Rate Last Admin   0.9 %  sodium chloride  infusion   Intravenous Once [name], Vinay, MD        VITAL SIGNS: BP 98/78   Pulse 97   Temp 97.9 F (36.6 C) (Temporal)   Resp 16   Ht 5' 5 (1.651 m)   Wt 127 lb (57.6 kg)   SpO2 98%   BMI 21.13 kg/m  Filed Weights   09/26/24 1338  Weight: 127 lb (57.6 kg)      Estimated body mass index is 21.13 kg/m as  calculated from the following:   Height as of this encounter: 5' 5 (1.651 m).   Weight as of this encounter: 127 lb (57.6 kg).   PERFORMANCE STATUS (ECOG) : 1 - Symptomatic but completely ambulatory   Physical Exam General: NAD Cardiovascular: regular rate and rhythm Pulmonary: normal breathing pattern Extremities: no edema, no joint deformities, nodule to spine area. Skin: no rashes Neurological: AAO x3  IMPRESSION: Discussed the use of AI scribe software for clinical note transcription with the patient, who gave verbal consent to proceed.  History of  Present Illness Natalie Griffin is a 49 year old female who presented to clinic for symptom management follow-up. Patient is doing well overall. No issues with constipation, diarrhea, nausea, or vomiting, noting that these symptoms are under control. Occasional fatigue however tries to remain as active as possible. Appetite is good. Weight is stable at 127lbs  appreciative of closing out some projects which have caused her some added stress over the past several months.   Recently seen by Ortho due to nodules on the bottom of her feet. Reports no significant interventions with known risk of recurrence with surgery. She is managing symptoms as she considers all options and causes.   She has experienced increased mid-lower back pain over the past week. The pain is significant but not severe enough to contact her oncologist. She reports that she is not considering any radiation treatment for her pain.  She is currently taking Valium  5 mg as needed, which effectively prevents nightmares. Due to increased stress, she takes it more frequently but finds it helpful. She has stopped taking gabapentin  and instead uses a low-dose CBD gummy which she finds effective in managing her symptoms without the need gabapentin .   Storie reports her pain is well-controlled on current regimen. Some days are better than others. Recent increase in pain which she attributes to weather changes. Her current regimen includes  MS Contin  60 mg every 8 hours. No adjustments to this regimen at this time.   All questions answered and support provided.    Assessment & Plan Cancer related pain management  Pain well-managed with morphine  and Percocet.  No adjustments to current regimen at this time.   - Continue MS Contin  60 mg every 8 hours. - Patient is not requiring breakthrough pain medication. -Continue antiemetics for nausea and vomiting. - Will continue to closely monitor and adjust regimen as needed.  Anxiety  disorder Exacerbated by cancer diagnosis and treatment. Xanax  0.5 mg was previously used but discontinued due to adverse effects including nightmares and restlessness. Valium  is considered as an alternative due to its intermediate potency compared to Xanax  and Ativan. - Prescribed Valium  5mg  for anxiety management. -Patient is closely followed by LCSW for additional support   Palliative care management Experiencing stress related to work and financial concerns, with an upcoming transition to Medicare in April. Reports increased use of Valium  due to stress but anticipates improvement soon. Interested in acupuncture but limited by financial constraints. - Encouraged exploration of alternative therapies such as acupuncture if financially feasible. - Supported transition to [employer] in April.  I will plan to see patient back in 4-6 weeks.  Sooner if needed.  Patient expressed understanding and was in agreement with this plan. She also understands that She can call the clinic at any time with any questions, concerns, or complaints.   Any controlled substances utilized were prescribed in the context of palliative care. PDMP has been reviewed.   I personally spent a  total of 40 minutes in the care of the patient today including preparing to see the patient, getting/reviewing separately obtained history, performing a medically appropriate exam/evaluation, counseling and educating, placing orders, referring and communicating with other health care professionals, and documenting clinical information in the EHR. Visit consisted of counseling and education dealing with the complex and emotionally intense issues of symptom management and palliative care in the setting of serious and potentially life-threatening illness.  Levon Borer, AGPCNP-BC  Palliative Medicine Team/[facility]    "

## 2024-09-26 NOTE — Patient Instructions (Signed)
 [facility] - A DEPT OF [facility]  Discharge Instructions: Thank you for choosing [facility] to provide your oncology and hematology care.   If you have a lab appointment with the Cancer Center, please go directly to the Cancer Center and check in at the registration area.   Wear comfortable clothing and clothing appropriate for easy access to any Portacath or PICC line.   We strive to give you quality time with your provider. You may need to reschedule your appointment if you arrive late (15 or more minutes).  Arriving late affects you and other patients whose appointments are after yours.  Also, if you miss three or more appointments without notifying the office, you may be dismissed from the clinic at the providers discretion.      For prescription refill requests, have your pharmacy contact our office and allow 72 hours for refills to be completed.    Today you received the following chemotherapy and/or immunotherapy agents: trastuzumab -hyaluronidase -oysk (HERCEPTIN  HYLECTA)     To help prevent nausea and vomiting after your treatment, we encourage you to take your nausea medication as directed.  BELOW ARE SYMPTOMS THAT SHOULD BE REPORTED IMMEDIATELY: *FEVER GREATER THAN 100.4 F (38 C) OR HIGHER *CHILLS OR SWEATING *NAUSEA AND VOMITING THAT IS NOT CONTROLLED WITH YOUR NAUSEA MEDICATION *UNUSUAL SHORTNESS OF BREATH *UNUSUAL BRUISING OR BLEEDING *URINARY PROBLEMS (pain or burning when urinating, or frequent urination) *BOWEL PROBLEMS (unusual diarrhea, constipation, pain near the anus) TENDERNESS IN MOUTH AND THROAT WITH OR WITHOUT PRESENCE OF ULCERS (sore throat, sores in mouth, or a toothache) UNUSUAL RASH, SWELLING OR PAIN  UNUSUAL VAGINAL DISCHARGE OR ITCHING   Items with * indicate a potential emergency and should be followed up as soon as possible or go to the Emergency Department if any problems should occur.  Please show the  CHEMOTHERAPY ALERT CARD or IMMUNOTHERAPY ALERT CARD at check-in to the Emergency Department and triage nurse.  Should you have questions after your visit or need to cancel or reschedule your appointment, please contact [facility] - A DEPT OF [facility]  Dept: [phone]  and follow the prompts.  Office hours are 8:00 a.m. to 4:30 p.m. Monday - Friday. Please note that voicemails left after 4:00 p.m. may not be returned until the following business day.  We are closed weekends and major holidays. You have access to a nurse at all times for urgent questions. Please call the main number to the clinic Dept: [phone] and follow the prompts.   For any non-urgent questions, you may also contact your provider using MyChart. We now offer e-Visits for anyone 40 and older to request care online for non-urgent symptoms. For details visit [email or web address].   Also download the MyChart app! Go to the app store, search MyChart, open the app, select [facility], and log in with your MyChart username and password.

## 2024-09-26 NOTE — Progress Notes (Signed)
 "  Office Visit Note   Patient: Natalie Griffin           Date of Birth: 1975/09/28           MRN: [id number] Visit Date: 09/25/2024              Requested by: Vita Morrow, MD [address] [place],  [place] [address] PCP: Vita Morrow, MD  Chief Complaint  Patient presents with   Right Foot - Pain      HPI: Discussed the use of AI scribe software for clinical note transcription with the patient, who gave verbal consent to proceed.  History of Present Illness Natalie Griffin is a 49 year old female with bilateral plantar fascial fibromatosis who presents for evaluation of painful plantar nodules.  Over the past several months, she has developed multiple tender nodules in the plantar arches of both feet. The right foot is most symptomatic, with one large, painful nodule and several smaller satellite nodules. The left foot has approximately three nodules, which have recently begun to cause discomfort. She describes the nodules as having appeared suddenly, without preceding trauma or identifiable cause.  She previously underwent lancing procedures on both feet by another physician, with no foreign material identified. She denies any history of palmar Dupuytren's contracture. She reports no known vitamin or supplement deficiencies.  She has a rod in her leg, which makes it difficult to put on and take off socks.     Assessment & Plan: Visit Diagnoses:  1. Pain in right foot   2. Plantar fascial fibromatosis     Plan: Assessment and Plan Assessment & Plan Bilateral plantar fibromatosis Multiple nodules bilaterally, more symptomatic on the right. Current size and severity do not require surgery. - Trial of topical diclofenac gel for symptom relief. - Use well-cushioned footwear to reduce discomfort. - Explained surgical excision reserved for large or severely symptomatic nodules due to scar risk and potential recurrence. - Follow-up as needed.      Follow-Up  Instructions: Return if symptoms worsen or fail to improve.   Ortho Exam  Patient is alert, oriented, no adenopathy, well-dressed, normal affect, normal respiratory effort. Physical Exam CARDIOVASCULAR: Pulses good bilaterally. MUSCULOSKELETAL: Three plantar fibromatosis on left foot, one large painful plantar fibromatosis on right foot. Several small satellite plantar fibromatosis on right foot. No Dupuytren's contracture in either hand. No palmar fibromatosis.      Imaging: No results found. No images are attached to the encounter.  Labs: Lab Results  Component Value Date   ESRSEDRATE 56 (H) 03/24/2023   ESRSEDRATE 69 (H) 12/31/2022   CRP 4.5 (H) 03/25/2023   CRP 7.8 (H) 12/31/2022   REPTSTATUS 07/01/2023 FINAL 06/26/2023   CULT  06/26/2023    NO GROWTH 5 DAYS Performed at [facility], [address], [place], [place] [address]      Lab Results  Component Value Date   ALBUMIN 4.6 07/27/2024   ALBUMIN 4.2 04/10/2024   ALBUMIN 4.4 01/31/2024    Lab Results  Component Value Date   MG 2.1 06/27/2023   MG 2.1 03/25/2023   MG 2.1 03/24/2023   No results found for: VD25OH  No results found for: PREALBUMIN    Latest Ref Rng & Units 07/27/2024    3:44 PM 04/10/2024    2:58 PM 01/31/2024    2:48 PM  CBC EXTENDED  WBC 4.0 - 10.5 K/uL 8.8  6.1  4.8   RBC 3.87 - 5.11 MIL/uL 4.06  3.80  4.00   Hemoglobin 12.0 - 15.0 g/dL 88.1  89.1  88.4   HCT 36.0 - 46.0 % 35.6  33.6  35.2   Platelets 150 - 400 K/uL 279  229  266   NEUT# 1.7 - 7.7 K/uL 6.4  4.1  3.2   Lymph# 0.7 - 4.0 K/uL 1.1  0.7  0.7      There is no height or weight on file to calculate BMI.  Orders:  Orders Placed This Encounter  Procedures   XR Foot 2 Views Right   No orders of the defined types were placed in this encounter.    Procedures: No procedures performed  Clinical Data: No additional findings.  ROS:  All other systems negative, except as noted in the HPI. Review of  Systems  Objective: Vital Signs: There were no vitals taken for this visit.  Specialty Comments:  No specialty comments available.  PMFS History: Patient Active Problem List   Diagnosis Date Noted   Establishing care with new doctor, encounter for 04/13/2024   Attention deficit hyperactivity disorder (ADHD), combined type 04/13/2024   Encounter for completion of form with patient 04/13/2024   Pain from bone metastases (HCC) 11/18/2023   Cancer associated pain 03/24/2023   Closed wedge compression fracture of T10 vertebra (HCC) 12/29/2022   Hypokalemia 12/29/2022   Malignant neoplasm metastatic to bone (HCC) 12/08/2021   Metastatic cancer to spine (HCC) 10/15/2021   Pathological fracture of right hip (HCC) 04/01/2021   Malignant neoplasm of upper-inner quadrant of left breast in female, estrogen receptor positive (HCC) 05/09/2020   Ductal carcinoma in situ of breast 04/11/2020   Past Medical History:  Diagnosis Date   Anemia    Anxiety    Bone metastasis    Breast cancer (HCC) 2021   Left breast invasive ductal carcinoma   Cancer (HCC) 2021   Left breast   Complication of anesthesia    pseudocholinesterase deficiency   Family history of adverse reaction to anesthesia    Father and Aunt have pseudocholinesterase deficiency - Trouble waking up from anesthesia   History of hiatal hernia    History of kidney stones    noted on CT Left nonobstructive   Pseudocholinesterase deficiency     Family History  Problem Relation Age of Onset   Colon cancer Neg Hx    Esophageal cancer Neg Hx    Stomach cancer Neg Hx    Rectal cancer Neg Hx     Past Surgical History:  Procedure Laterality Date   BREAST BIOPSY Right 07/21/2022   US  RT BREAST BX W LOC DEV 1ST LESION IMG BX SPEC US  GUIDE 07/21/2022 [facility]   BREAST BIOPSY  06/08/2023   MM RT RADIOACTIVE SEED LOC MAMMO GUIDE 06/08/2023 [facility]   BREAST LUMPECTOMY WITH RADIOACTIVE SEED AND SENTINEL LYMPH NODE  BIOPSY Left 06/04/2020   Procedure: LEFT BREAST LUMPECTOMY WITH RADIOACTIVE SEED AND SENTINEL LYMPH NODE MAPPING;  Surgeon: Vanderbilt Ned, MD;  Location: [facility];  Service: General;  Laterality: Left;  PEC BLOCK, RNFA   BREAST LUMPECTOMY WITH RADIOACTIVE SEED LOCALIZATION Right 06/09/2023   Procedure: RIGHT BREAST LUMPECTOMY WITH RADIOACTIVE SEED LOCALIZATION;  Surgeon: Vanderbilt Ned, MD;  Location: [facility];  Service: General;  Laterality: Right;   FEMUR IM NAIL Right 04/01/2021   Procedure: INTRAMEDULLARY (IM) NAIL FEMORAL;  Surgeon: Beverley Evalene BIRCH, MD;  Location: [facility];  Service: Orthopedics;  Laterality: Right;   ROBOTIC ASSISTED BILATERAL SALPINGO OOPHERECTOMY Bilateral 09/30/2022  Procedure: XI ROBOTIC ASSISTED BILATERAL SALPINGO OOPHORECTOMY;  Surgeon: Viktoria Comer SAUNDERS, MD;  Location: [facility];  Service: Gynecology;  Laterality: Bilateral;   WISDOM TOOTH EXTRACTION     Social History   Occupational History   Not on file  Tobacco Use   Smoking status: Former    Current packs/day: 0.00    Types: Cigarettes    Quit date: 05/19/2020    Years since quitting: 4.3   Smokeless tobacco: Never  Vaping Use   Vaping status: Never Used  Substance and Sexual Activity   Alcohol use: Not Currently   Drug use: Never   Sexual activity: Not Currently         "

## 2024-09-27 ENCOUNTER — Other Ambulatory Visit (HOSPITAL_COMMUNITY): Payer: Self-pay

## 2024-09-28 ENCOUNTER — Other Ambulatory Visit (HOSPITAL_COMMUNITY): Payer: Self-pay

## 2024-09-28 ENCOUNTER — Encounter: Payer: Self-pay | Admitting: Hematology and Oncology

## 2024-09-28 ENCOUNTER — Ambulatory Visit: Admitting: Orthopedic Surgery

## 2024-10-01 ENCOUNTER — Other Ambulatory Visit: Payer: Self-pay

## 2024-10-03 ENCOUNTER — Encounter: Payer: Self-pay | Admitting: Hematology and Oncology

## 2024-10-03 ENCOUNTER — Other Ambulatory Visit (HOSPITAL_COMMUNITY): Payer: Self-pay

## 2024-10-03 ENCOUNTER — Other Ambulatory Visit: Payer: Self-pay | Admitting: Family Medicine

## 2024-10-03 DIAGNOSIS — F902 Attention-deficit hyperactivity disorder, combined type: Secondary | ICD-10-CM

## 2024-10-03 MED ORDER — AMPHETAMINE-DEXTROAMPHETAMINE 10 MG PO TABS
10.0000 mg | ORAL_TABLET | Freq: Two times a day (BID) | ORAL | 0 refills | Status: AC
  Filled 2024-10-03: qty 60, 30d supply, fill #0

## 2024-10-04 ENCOUNTER — Telehealth: Payer: Self-pay

## 2024-10-04 NOTE — Telephone Encounter (Signed)
 LVM for pt regarding her disability form being ompleted,faxed,and confirmation received. Pt copy will be mailed to her home address. Left call back number for questions.

## 2024-10-06 ENCOUNTER — Encounter: Payer: Self-pay | Admitting: Hematology and Oncology

## 2024-10-06 ENCOUNTER — Telehealth: Payer: Self-pay

## 2024-10-06 NOTE — Telephone Encounter (Signed)
 CSW Intern attempted to contact patient by phone to schedule counseling appointment. Intern left voicemail with direct contact information encouraging patient to call back.  Thersia Daring Clinical Social Work Intern [employer]

## 2024-10-09 ENCOUNTER — Other Ambulatory Visit (HOSPITAL_COMMUNITY): Payer: Self-pay

## 2024-10-11 ENCOUNTER — Encounter: Payer: Self-pay | Admitting: Hematology and Oncology

## 2024-10-11 ENCOUNTER — Other Ambulatory Visit (HOSPITAL_COMMUNITY): Payer: Self-pay

## 2024-10-13 ENCOUNTER — Inpatient Hospital Stay: Payer: Self-pay

## 2024-10-13 NOTE — Progress Notes (Signed)
 [facility] CSW Progress Note  Patient reached out to Clinical Social Work Intern by phone regarding financial assistance for rent payment.   Interventions: Intern sent Cancer Services referral to CSW to be submitted on Monday 10/16/24 when office opens.     Follow Up Plan:  CSW Intern will follow up with patient by phonewhen back in the office on 10/18/24.    [name] Clinical Social Work Intern [employer]

## 2024-10-16 ENCOUNTER — Inpatient Hospital Stay: Payer: Self-pay | Admitting: Licensed Clinical Social Worker

## 2024-10-16 DIAGNOSIS — C7951 Secondary malignant neoplasm of bone: Secondary | ICD-10-CM

## 2024-10-16 NOTE — Progress Notes (Signed)
 [facility] CSW Progress Note    Interventions: Referral sent to Cancer Services on behalf of pt w/ request to inform by 1/29 of ability to assist w/ rent payment.       Follow Up Plan:  Patient will contact CSW with any support or resource needs    Devere JONELLE Manna, LCSW Clinical Social Worker [facility]

## 2024-10-20 ENCOUNTER — Encounter: Payer: Self-pay | Admitting: Hematology and Oncology

## 2024-10-21 ENCOUNTER — Other Ambulatory Visit (HOSPITAL_COMMUNITY): Payer: Self-pay

## 2024-10-23 ENCOUNTER — Inpatient Hospital Stay: Payer: Self-pay | Attending: Hematology and Oncology

## 2024-10-23 ENCOUNTER — Encounter: Payer: Self-pay | Admitting: Hematology and Oncology

## 2024-10-23 ENCOUNTER — Other Ambulatory Visit (HOSPITAL_COMMUNITY): Payer: Self-pay

## 2024-10-23 ENCOUNTER — Encounter: Payer: Self-pay | Admitting: Nurse Practitioner

## 2024-10-23 ENCOUNTER — Inpatient Hospital Stay: Payer: Self-pay | Admitting: Hematology and Oncology

## 2024-10-23 DIAGNOSIS — Z515 Encounter for palliative care: Secondary | ICD-10-CM

## 2024-10-23 DIAGNOSIS — G893 Neoplasm related pain (acute) (chronic): Secondary | ICD-10-CM

## 2024-10-23 DIAGNOSIS — C50212 Malignant neoplasm of upper-inner quadrant of left female breast: Secondary | ICD-10-CM

## 2024-10-23 DIAGNOSIS — C7951 Secondary malignant neoplasm of bone: Secondary | ICD-10-CM

## 2024-10-23 MED ORDER — DIAZEPAM 5 MG PO TABS
5.0000 mg | ORAL_TABLET | Freq: Four times a day (QID) | ORAL | 2 refills | Status: AC | PRN
  Filled 2024-10-23 (×2): qty 60, 15d supply, fill #0
  Filled ????-??-??: fill #0

## 2024-10-23 NOTE — Progress Notes (Signed)
 "    Palliative Medicine [facility]  [phone] [phone]   Name: Natalie Griffin Date: 10/23/2024 MRN: [id number]  DOB: September 20, 1976  Patient Care Team: Natalie Morrow, MD as PCP - General (Family Medicine) Natalie Nanetta SAILOR, RN as Oncology Nurse Navigator Natalie Ash, MD as Consulting Physician (Hematology and Oncology) Pickenpack-Cousar, Fannie SAILOR, NP as Nurse Practitioner (Hospice and Palliative Medicine)   I connected with Natalie Griffin on 10/23/24 at  3:30 PM EST by telephone and verified that I am speaking with the correct person using two identifiers.   I discussed the limitations, risks, security and privacy concerns of performing an evaluation and management service by telemedicine and the availability of in-person appointments. I also discussed with the patient that there may be a patient responsible charge related to this service. The patient expressed understanding and agreed to proceed.   Other persons participating in the visit and their role in the encounter: None    Patients location: Home   Providers location: [facility]   INTERVAL HISTORY: Natalie Griffin is a 49 y.o. 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 Palliative ask to see for symptom management and goals of care.   SOCIAL HISTORY:     reports that she quit smoking about 4 years ago. Her smoking use included cigarettes. She has never used smokeless tobacco. She reports that she does not currently use alcohol. She reports that she does not use drugs.  ADVANCE DIRECTIVES:  Advanced directives on file naming Natalie Griffin as patient healthcare power of attorney if patient becomes unable to speak for themselves.   CODE STATUS: DNR  PAST MEDICAL HISTORY: Past Medical History:  Diagnosis Date   Anemia    Anxiety    Bone metastasis    Breast cancer (HCC) 2021   Left breast  invasive ductal carcinoma   Cancer (HCC) 2021   Left breast   Complication of anesthesia    pseudocholinesterase deficiency   Family history of adverse reaction to anesthesia    Father and Aunt have pseudocholinesterase deficiency - Trouble waking up from anesthesia   History of hiatal hernia    History of kidney stones    noted on CT Left nonobstructive   Pseudocholinesterase deficiency     ALLERGIES:  is allergic to adhesive [tape], fentanyl , phesgo  [[id number]], tetanus toxoid, and succinylcholine.  MEDICATIONS:  Current Outpatient Medications  Medication Sig Dispense Refill   amphetamine -dextroamphetamine  (ADDERALL) 10 MG tablet Take 1 tablet (10 mg total) by mouth 2 (two) times daily. 60 tablet 0   amphetamine -dextroamphetamine  (ADDERALL) 10 MG tablet Take 1 tablet (10 mg total) by mouth 2 (two) times daily. 60 tablet 0   anastrozole  (ARIMIDEX ) 1 MG tablet Take 1 tablet (1 mg total) by mouth daily. 90 tablet 3   diazepam  (VALIUM ) 5 MG tablet Take 1 tablet (5 mg total) by mouth every 6 (six) hours as needed for anxiety. 60 tablet 2   morphine  (MS CONTIN ) 60 MG 12 hr tablet Take 1 tablet (60 mg total) by mouth every 8 (eight) hours. 90 tablet 0   Multiple Vitamin (ONE DAILY MULTIVITAMIN ADULT PO) Take 1 tablet by mouth daily.     ondansetron  (ZOFRAN -ODT) 4 MG disintegrating tablet Take 1 tablet (4 mg total) by mouth every 8 (eight) hours as needed for nausea or vomiting. 20 tablet 3   No current facility-administered medications for this visit.    VITAL SIGNS:  There were no vitals taken for this visit. There were no vitals filed for this visit.     Estimated body mass index is 21.13 kg/m as calculated from the following:   Height as of 09/26/24: 5' 5 (1.651 m).   Weight as of 09/26/24: 127 lb (57.6 kg).   PERFORMANCE STATUS (ECOG) : 1 - Symptomatic but completely ambulatory  IMPRESSION: Discussed the use of AI scribe software for clinical note  transcription with the patient, who gave verbal consent to proceed.  History of Present Illness [name] is a 49 year old female with metastatic breast cancer who I connected with by phone for symptom management follow-up.   [name] states she experiences increased anxiety and panic attacks, which she attributes to recent financial stressors. Her episodes include finding herself in the kitchen without recollection and increased pacing, which she associates with panic. She has been taking Valium  more regularly due to these panic attacks. Previously taken on an as-needed basis, she now takes 5 mg every eight hours during the day. She feels some relief with use of Valium  however there have been times when panic sets in prior to 8 hrs leaving her to try to calm herself with therapeutic methods such as deep breathing and imagery which as less effective. Patient will continue with temporary use of Valium  through this stressful life phase every 6 hours as needed.   She reports pain is well controlled on current regimen. No adjustments to be made at this time. Patient's regimen consist of morphine  60mg  every 8 hours. Does not require breakthrough medication.   All questions answered and support provided.   Assessment & Plan Anxiety disorder Increased anxiety due to financial stress and recent insurance issues, leading to more frequent panic attacks. Current Valium  regimen of 5 mg every 8 hours is insufficient, causing increased anxiety and restlessness. - Increased Valium  to 5 mg every 6 hours as needed. - Sent new prescription to pharmacy with updated instructions.  Cancer Related Pain  Well controlled on current regimen. No adjustments.  -Continue MS Contin  60mg  every 8 hours.  - Will follow up in 4-6 weeks or sooner if needed.  Patient expressed understanding and was in agreement with this plan. She also understands that She can call the clinic at any time with any questions, concerns, or  complaints.   Any controlled substances utilized were prescribed in the context of palliative care. PDMP has been reviewed.   I provided 40 minutes of non face-to-face telephone visit time during this encounter, and > 50% was spent counseling as documented under my assessment & plan. Visit consisted of counseling and education dealing with the complex and emotionally intense issues of symptom management and palliative care in the setting of serious and potentially life-threatening illness.  Levon Borer, AGPCNP-BC  Palliative Medicine Team/[facility]    "

## 2024-10-27 ENCOUNTER — Encounter: Payer: Self-pay | Admitting: Hematology and Oncology

## 2024-10-28 ENCOUNTER — Inpatient Hospital Stay: Payer: Self-pay | Admitting: Hematology and Oncology

## 2024-10-28 ENCOUNTER — Inpatient Hospital Stay: Payer: Self-pay

## 2024-11-02 ENCOUNTER — Ambulatory Visit (HOSPITAL_COMMUNITY): Payer: Self-pay
